# Patient Record
Sex: Male | Born: 1935
Health system: Southern US, Community
[De-identification: ages and names within clinical notes are randomized; demographics above are authoritative.]

## PROBLEM LIST (undated history)

## (undated) ENCOUNTER — Emergency Department (HOSPITAL_COMMUNITY): Discharge status: Against Medical Advice | Payer: Medicare Other

## (undated) DIAGNOSIS — G839 Paralytic syndrome, unspecified: Secondary | ICD-10-CM

## (undated) DIAGNOSIS — K209 Esophagitis, unspecified: Secondary | ICD-10-CM

## (undated) DIAGNOSIS — C61 Malignant neoplasm of prostate: Secondary | ICD-10-CM

## (undated) DIAGNOSIS — Z86010 Personal history of colonic polyps: Secondary | ICD-10-CM

## (undated) DIAGNOSIS — J309 Allergic rhinitis, unspecified: Secondary | ICD-10-CM

## (undated) DIAGNOSIS — N183 Chronic kidney disease, stage 3 (moderate): Principal | ICD-10-CM

## (undated) DIAGNOSIS — K4020 Bilateral inguinal hernia, without obstruction or gangrene, not specified as recurrent: Secondary | ICD-10-CM

## (undated) DIAGNOSIS — R6882 Decreased libido: Secondary | ICD-10-CM

## (undated) DIAGNOSIS — K219 Gastro-esophageal reflux disease without esophagitis: Secondary | ICD-10-CM

## (undated) DIAGNOSIS — K8020 Calculus of gallbladder without cholecystitis without obstruction: Secondary | ICD-10-CM

## (undated) DIAGNOSIS — R972 Elevated prostate specific antigen [PSA]: Secondary | ICD-10-CM

## (undated) DIAGNOSIS — I480 Paroxysmal atrial fibrillation: Secondary | ICD-10-CM

## (undated) DIAGNOSIS — K819 Cholecystitis, unspecified: Secondary | ICD-10-CM

## (undated) DIAGNOSIS — E785 Hyperlipidemia, unspecified: Secondary | ICD-10-CM

## (undated) DIAGNOSIS — I10 Essential (primary) hypertension: Secondary | ICD-10-CM

## (undated) HISTORY — DX: Elevated prostate specific antigen (PSA): R97.20

## (undated) HISTORY — DX: Decreased libido: R68.82

## (undated) HISTORY — PX: HIP SURGERY: SHX245

## (undated) HISTORY — DX: Essential (primary) hypertension: I10

## (undated) HISTORY — PX: HERNIA REPAIR: SHX51

## (undated) HISTORY — DX: Esophagitis, unspecified: K20.9

## (undated) HISTORY — DX: Personal history of colonic polyps: Z86.010

## (undated) HISTORY — PX: JOINT REPLACEMENT: SHX530

## (undated) HISTORY — DX: Allergic rhinitis, unspecified: J30.9

## (undated) HISTORY — DX: Hyperlipidemia, unspecified: E78.5

## (undated) HISTORY — PX: PROSTATE CRYOABLATION: SHX1032

## (undated) HISTORY — DX: Malignant neoplasm of prostate: C61

## (undated) HISTORY — DX: Chronic kidney disease, stage 3 (moderate): N18.3

---

## 1997-07-29 ENCOUNTER — Other Ambulatory Visit: Admission: RE | Admit: 1997-07-29 | Discharge: 1997-07-29 | Payer: Self-pay | Admitting: Internal Medicine

## 1998-10-12 ENCOUNTER — Ambulatory Visit (HOSPITAL_COMMUNITY): Admission: RE | Admit: 1998-10-12 | Discharge: 1998-10-12 | Payer: Self-pay | Admitting: *Deleted

## 2001-10-10 ENCOUNTER — Encounter (INDEPENDENT_AMBULATORY_CARE_PROVIDER_SITE_OTHER): Payer: Self-pay

## 2001-10-10 ENCOUNTER — Ambulatory Visit (HOSPITAL_COMMUNITY): Admission: RE | Admit: 2001-10-10 | Discharge: 2001-10-10 | Payer: Self-pay | Admitting: *Deleted

## 2004-01-19 ENCOUNTER — Ambulatory Visit (HOSPITAL_COMMUNITY): Admission: RE | Admit: 2004-01-19 | Discharge: 2004-01-19 | Payer: Self-pay | Admitting: General Surgery

## 2004-07-11 ENCOUNTER — Encounter (INDEPENDENT_AMBULATORY_CARE_PROVIDER_SITE_OTHER): Payer: Self-pay | Admitting: Specialist

## 2004-07-11 ENCOUNTER — Ambulatory Visit (HOSPITAL_COMMUNITY): Admission: RE | Admit: 2004-07-11 | Discharge: 2004-07-11 | Payer: Self-pay | Admitting: General Surgery

## 2004-07-11 ENCOUNTER — Ambulatory Visit (HOSPITAL_BASED_OUTPATIENT_CLINIC_OR_DEPARTMENT_OTHER): Admission: RE | Admit: 2004-07-11 | Discharge: 2004-07-11 | Payer: Self-pay | Admitting: General Surgery

## 2004-09-28 ENCOUNTER — Ambulatory Visit: Payer: Self-pay | Admitting: Internal Medicine

## 2004-10-05 ENCOUNTER — Ambulatory Visit: Payer: Self-pay | Admitting: Internal Medicine

## 2005-03-14 ENCOUNTER — Ambulatory Visit: Payer: Self-pay | Admitting: Internal Medicine

## 2005-07-28 ENCOUNTER — Ambulatory Visit: Payer: Self-pay | Admitting: Internal Medicine

## 2005-10-10 ENCOUNTER — Ambulatory Visit: Payer: Self-pay | Admitting: Internal Medicine

## 2005-10-17 ENCOUNTER — Ambulatory Visit: Payer: Self-pay | Admitting: Internal Medicine

## 2006-05-08 ENCOUNTER — Ambulatory Visit: Payer: Self-pay | Admitting: Internal Medicine

## 2006-05-08 LAB — CONVERTED CEMR LAB: PSA: 4.52 ng/mL — ABNORMAL HIGH (ref 0.10–4.00)

## 2006-11-01 LAB — HM COLONOSCOPY

## 2007-01-14 ENCOUNTER — Encounter: Payer: Self-pay | Admitting: Internal Medicine

## 2007-01-14 DIAGNOSIS — I10 Essential (primary) hypertension: Secondary | ICD-10-CM

## 2007-01-14 DIAGNOSIS — E785 Hyperlipidemia, unspecified: Secondary | ICD-10-CM | POA: Insufficient documentation

## 2007-01-14 DIAGNOSIS — Z860100 Personal history of colon polyps, unspecified: Secondary | ICD-10-CM | POA: Insufficient documentation

## 2007-01-14 DIAGNOSIS — J309 Allergic rhinitis, unspecified: Secondary | ICD-10-CM

## 2007-01-14 DIAGNOSIS — E782 Mixed hyperlipidemia: Secondary | ICD-10-CM | POA: Insufficient documentation

## 2007-01-14 DIAGNOSIS — K209 Esophagitis, unspecified without bleeding: Secondary | ICD-10-CM | POA: Insufficient documentation

## 2007-01-14 DIAGNOSIS — K219 Gastro-esophageal reflux disease without esophagitis: Secondary | ICD-10-CM | POA: Insufficient documentation

## 2007-01-14 HISTORY — DX: Personal history of colon polyps, unspecified: Z86.0100

## 2007-01-14 HISTORY — DX: Allergic rhinitis, unspecified: J30.9

## 2007-01-14 HISTORY — DX: Personal history of colonic polyps: Z86.010

## 2007-01-14 HISTORY — DX: Hyperlipidemia, unspecified: E78.5

## 2007-01-14 HISTORY — DX: Esophagitis, unspecified without bleeding: K20.90

## 2007-01-14 HISTORY — DX: Essential (primary) hypertension: I10

## 2007-01-15 ENCOUNTER — Ambulatory Visit: Payer: Self-pay | Admitting: Internal Medicine

## 2007-01-15 DIAGNOSIS — R6882 Decreased libido: Secondary | ICD-10-CM | POA: Insufficient documentation

## 2007-01-15 HISTORY — DX: Decreased libido: R68.82

## 2007-01-15 LAB — CONVERTED CEMR LAB
ALT: 24 units/L (ref 0–53)
AST: 22 units/L (ref 0–37)
Albumin: 4.1 g/dL (ref 3.5–5.2)
Alkaline Phosphatase: 54 units/L (ref 39–117)
Bilirubin, Direct: 0.2 mg/dL (ref 0.0–0.3)
CO2: 28 meq/L (ref 19–32)
Chloride: 104 meq/L (ref 96–112)
Cholesterol: 186 mg/dL (ref 0–200)
HDL: 39.5 mg/dL (ref >39.0)
LDL Cholesterol: 114 mg/dL — ABNORMAL HIGH (ref 0–99)
PSA: 5.28 ng/mL — ABNORMAL HIGH (ref 0.10–4.00)
Potassium: 4 meq/L (ref 3.5–5.1)
Sodium: 142 meq/L (ref 135–145)
Testosterone: 400.68 ng/dL (ref 350.00–890)
Total Bilirubin: 1.1 mg/dL (ref 0.3–1.2)
Total CHOL/HDL Ratio: 4.7
Total Protein: 7.7 g/dL (ref 6.0–8.3)
Triglycerides: 165 mg/dL — ABNORMAL HIGH (ref 0–149)
VLDL: 33 mg/dL (ref 0–40)

## 2007-01-22 ENCOUNTER — Encounter: Payer: Self-pay | Admitting: Internal Medicine

## 2008-01-21 ENCOUNTER — Ambulatory Visit: Payer: Self-pay | Admitting: Internal Medicine

## 2008-02-20 ENCOUNTER — Ambulatory Visit: Payer: Self-pay | Admitting: Internal Medicine

## 2008-02-20 LAB — CONVERTED CEMR LAB
ALT: 19 units/L (ref 0–53)
AST: 20 units/L (ref 0–37)
Albumin: 3.7 g/dL (ref 3.5–5.2)
Alkaline Phosphatase: 56 units/L (ref 39–117)
BUN: 16 mg/dL (ref 6–23)
Bilirubin, Direct: 0.2 mg/dL (ref 0.0–0.3)
CO2: 32 meq/L (ref 19–32)
Calcium: 9.2 mg/dL (ref 8.4–10.5)
Chloride: 105 meq/L (ref 96–112)
Cholesterol: 155 mg/dL (ref 0–200)
Creatinine, Ser: 1.5 mg/dL (ref 0.4–1.5)
GFR calc Af Amer: 59 mL/min
GFR calc non Af Amer: 49 mL/min
Glucose, Bld: 77 mg/dL (ref 70–99)
HDL: 37.9 mg/dL — ABNORMAL LOW (ref >39.0)
LDL Cholesterol: 96 mg/dL (ref 0–99)
PSA: 7.43 ng/mL — ABNORMAL HIGH (ref 0.10–4.00)
Potassium: 4.3 meq/L (ref 3.5–5.1)
Sodium: 141 meq/L (ref 135–145)
Testosterone: 366.39 ng/dL (ref 350.00–890)
Total Bilirubin: 0.9 mg/dL (ref 0.3–1.2)
Total CHOL/HDL Ratio: 4.1
Total Protein: 7.1 g/dL (ref 6.0–8.3)
Triglycerides: 108 mg/dL (ref 0–149)
VLDL: 22 mg/dL (ref 0–40)

## 2008-02-27 ENCOUNTER — Encounter: Payer: Self-pay | Admitting: Internal Medicine

## 2008-03-27 ENCOUNTER — Telehealth (INDEPENDENT_AMBULATORY_CARE_PROVIDER_SITE_OTHER): Payer: Self-pay | Admitting: *Deleted

## 2008-03-27 DIAGNOSIS — R972 Elevated prostate specific antigen [PSA]: Secondary | ICD-10-CM

## 2008-03-27 HISTORY — DX: Elevated prostate specific antigen (PSA): R97.20

## 2008-04-14 ENCOUNTER — Ambulatory Visit: Payer: Self-pay | Admitting: Internal Medicine

## 2008-04-14 LAB — CONVERTED CEMR LAB: PSA: 7.27 ng/mL — ABNORMAL HIGH (ref 0.10–4.00)

## 2008-04-19 ENCOUNTER — Encounter: Payer: Self-pay | Admitting: Internal Medicine

## 2008-05-19 ENCOUNTER — Telehealth: Payer: Self-pay | Admitting: Internal Medicine

## 2008-05-20 ENCOUNTER — Encounter (INDEPENDENT_AMBULATORY_CARE_PROVIDER_SITE_OTHER): Payer: Self-pay | Admitting: *Deleted

## 2008-06-16 ENCOUNTER — Encounter: Payer: Self-pay | Admitting: Internal Medicine

## 2008-07-29 ENCOUNTER — Encounter: Payer: Self-pay | Admitting: Internal Medicine

## 2008-08-12 ENCOUNTER — Encounter: Payer: Self-pay | Admitting: Internal Medicine

## 2008-08-31 ENCOUNTER — Encounter (HOSPITAL_COMMUNITY): Admission: RE | Admit: 2008-08-31 | Discharge: 2008-10-22 | Payer: Self-pay | Admitting: Urology

## 2008-09-02 ENCOUNTER — Encounter: Payer: Self-pay | Admitting: Internal Medicine

## 2009-01-21 ENCOUNTER — Ambulatory Visit: Payer: Self-pay | Admitting: Internal Medicine

## 2009-01-21 DIAGNOSIS — C61 Malignant neoplasm of prostate: Secondary | ICD-10-CM | POA: Insufficient documentation

## 2009-01-21 HISTORY — DX: Malignant neoplasm of prostate: C61

## 2009-01-21 LAB — CONVERTED CEMR LAB
ALT: 24 units/L (ref 0–53)
AST: 22 units/L (ref 0–37)
Albumin: 3.8 g/dL (ref 3.5–5.2)
Alkaline Phosphatase: 40 units/L (ref 39–117)
BUN: 16 mg/dL (ref 6–23)
Basophils Absolute: 0.1 10*3/uL (ref 0.0–0.1)
Basophils Relative: 0.7 % (ref 0.0–3.0)
Bilirubin, Direct: 0 mg/dL (ref 0.0–0.3)
CO2: 31 meq/L (ref 19–32)
Calcium: 9.4 mg/dL (ref 8.4–10.5)
Chloride: 104 meq/L (ref 96–112)
Cholesterol: 187 mg/dL (ref 0–200)
Creatinine, Ser: 1.1 mg/dL (ref 0.4–1.5)
Direct LDL: 106.1 mg/dL
Eosinophils Absolute: 0.2 10*3/uL (ref 0.0–0.7)
Eosinophils Relative: 2.2 % (ref 0.0–5.0)
GFR calc non Af Amer: 69.55 mL/min (ref >60)
Glucose, Bld: 96 mg/dL (ref 70–99)
HCT: 39.8 % (ref 39.0–52.0)
HDL: 42.2 mg/dL (ref >39.00)
Hemoglobin: 13.8 g/dL (ref 13.0–17.0)
Lymphocytes Relative: 23.4 % (ref 12.0–46.0)
Lymphs Abs: 1.8 10*3/uL (ref 0.7–4.0)
MCHC: 34.6 g/dL (ref 30.0–36.0)
MCV: 95.3 fL (ref 78.0–100.0)
Monocytes Absolute: 0.6 10*3/uL (ref 0.1–1.0)
Monocytes Relative: 8.5 % (ref 3.0–12.0)
Neutro Abs: 4.9 10*3/uL (ref 1.4–7.7)
Neutrophils Relative %: 65.2 % (ref 43.0–77.0)
Platelets: 179 10*3/uL (ref 150.0–400.0)
Potassium: 4.3 meq/L (ref 3.5–5.1)
RBC: 4.18 M/uL — ABNORMAL LOW (ref 4.22–5.81)
RDW: 12.9 % (ref 11.5–14.6)
Sodium: 141 meq/L (ref 135–145)
TSH: 1.28 microintl units/mL (ref 0.35–5.50)
Total Bilirubin: 0.8 mg/dL (ref 0.3–1.2)
Total CHOL/HDL Ratio: 4
Total Protein: 7.7 g/dL (ref 6.0–8.3)
Triglycerides: 233 mg/dL — ABNORMAL HIGH (ref 0.0–149.0)
VLDL: 46.6 mg/dL — ABNORMAL HIGH (ref 0.0–40.0)
WBC: 7.6 10*3/uL (ref 4.5–10.5)

## 2009-02-22 ENCOUNTER — Encounter: Payer: Self-pay | Admitting: Internal Medicine

## 2009-03-03 ENCOUNTER — Encounter: Payer: Self-pay | Admitting: Internal Medicine

## 2009-03-05 ENCOUNTER — Telehealth (INDEPENDENT_AMBULATORY_CARE_PROVIDER_SITE_OTHER): Payer: Self-pay | Admitting: *Deleted

## 2009-04-13 ENCOUNTER — Encounter: Payer: Self-pay | Admitting: Internal Medicine

## 2009-04-16 ENCOUNTER — Ambulatory Visit (HOSPITAL_BASED_OUTPATIENT_CLINIC_OR_DEPARTMENT_OTHER): Admission: RE | Admit: 2009-04-16 | Discharge: 2009-04-16 | Payer: Self-pay | Admitting: Urology

## 2009-04-23 ENCOUNTER — Encounter: Payer: Self-pay | Admitting: Internal Medicine

## 2009-08-18 ENCOUNTER — Encounter: Payer: Self-pay | Admitting: Internal Medicine

## 2009-10-26 ENCOUNTER — Encounter: Payer: Self-pay | Admitting: Internal Medicine

## 2009-11-03 ENCOUNTER — Encounter: Payer: Self-pay | Admitting: Internal Medicine

## 2009-11-23 ENCOUNTER — Encounter: Payer: Self-pay | Admitting: Internal Medicine

## 2010-01-12 ENCOUNTER — Encounter: Payer: Self-pay | Admitting: Internal Medicine

## 2010-01-25 ENCOUNTER — Encounter: Payer: Self-pay | Admitting: Internal Medicine

## 2010-01-25 ENCOUNTER — Ambulatory Visit
Admission: RE | Admit: 2010-01-25 | Discharge: 2010-01-25 | Payer: Self-pay | Source: Home / Self Care | Attending: Internal Medicine | Admitting: Internal Medicine

## 2010-01-25 LAB — CONVERTED CEMR LAB
ALT: 21 units/L (ref 0–53)
AST: 21 units/L (ref 0–37)
Albumin: 3.7 g/dL (ref 3.5–5.2)
Alkaline Phosphatase: 38 units/L — ABNORMAL LOW (ref 39–117)
BUN: 19 mg/dL (ref 6–23)
Basophils Absolute: 0 10*3/uL (ref 0.0–0.1)
Basophils Relative: 0.2 % (ref 0.0–3.0)
Bilirubin, Direct: 0.1 mg/dL (ref 0.0–0.3)
CO2: 30 meq/L (ref 19–32)
Calcium: 9.4 mg/dL (ref 8.4–10.5)
Chloride: 103 meq/L (ref 96–112)
Cholesterol: 205 mg/dL — ABNORMAL HIGH (ref 0–200)
Creatinine, Ser: 1.4 mg/dL (ref 0.4–1.5)
Direct LDL: 131 mg/dL
Eosinophils Absolute: 0.2 10*3/uL (ref 0.0–0.7)
Eosinophils Relative: 1.9 % (ref 0.0–5.0)
GFR calc non Af Amer: 51.66 mL/min — ABNORMAL LOW (ref >60.00)
Glucose, Bld: 95 mg/dL (ref 70–99)
HCT: 42.4 % (ref 39.0–52.0)
HDL: 37 mg/dL — ABNORMAL LOW (ref >39.00)
Hemoglobin: 14.8 g/dL (ref 13.0–17.0)
Lymphocytes Relative: 13.3 % (ref 12.0–46.0)
Lymphs Abs: 1.4 10*3/uL (ref 0.7–4.0)
MCHC: 35 g/dL (ref 30.0–36.0)
MCV: 92.9 fL (ref 78.0–100.0)
Monocytes Absolute: 1.2 10*3/uL — ABNORMAL HIGH (ref 0.1–1.0)
Monocytes Relative: 11.1 % (ref 3.0–12.0)
Neutro Abs: 7.9 10*3/uL — ABNORMAL HIGH (ref 1.4–7.7)
Neutrophils Relative %: 73.5 % (ref 43.0–77.0)
Platelets: 188 10*3/uL (ref 150.0–400.0)
Potassium: 4.2 meq/L (ref 3.5–5.1)
RBC: 4.56 M/uL (ref 4.22–5.81)
RDW: 13.6 % (ref 11.5–14.6)
Sodium: 140 meq/L (ref 135–145)
Total Bilirubin: 0.8 mg/dL (ref 0.3–1.2)
Total CHOL/HDL Ratio: 6
Total Protein: 7.2 g/dL (ref 6.0–8.3)
Triglycerides: 362 mg/dL — ABNORMAL HIGH (ref 0.0–149.0)
VLDL: 72.4 mg/dL — ABNORMAL HIGH (ref 0.0–40.0)
WBC: 10.8 10*3/uL — ABNORMAL HIGH (ref 4.5–10.5)

## 2010-02-24 NOTE — Consult Note (Signed)
Summary: [facility]  [facility]   Imported By: Rise Patience 01/25/2010 14:21:07  _____________________________________________________________________  External Attachment:    Type:   Image     Comment:   External Document

## 2010-02-24 NOTE — Medication Information (Signed)
Summary: [facility]  [facility]   Imported By: Phillis Knack 03/09/2009 07:42:08  _____________________________________________________________________  External Attachment:    Type:   Image     Comment:   External Document

## 2010-02-24 NOTE — Assessment & Plan Note (Signed)
Summary: yearly cpx/cd   Vital Signs:  Patient Profile:   77 Years Old Male Height:     68 inches Weight:      190 pounds Temp:     97.9 degrees F oral Pulse rate:   62 / minute BP sitting:   142 / 82  (right arm) Cuff size:   large  Vitals Entered By: Iran Planas CMA (January 21, 2008 10:03 AM)                 PCP:  [facility]  Chief Complaint:  Annual visit for disease management.  History of Present Illness: Patient presents for annual exam. IN the interval since Dec '08, no medical problems, no surgeries or injuries. He reports that he has soreness in the right neck which moves to the chest and back in the evening which resolves over-night. He can push on the area of soreness in the right neck and it will go away. This happens several times a week. No chest heaviness or pressure, no SOB, no associated symptoms. Unrelated to meals.   Has morning congestion which is minor. No drainage or fever or other signs of infection.    Prior Medications Reviewed Using: Patient Recall  Updated Prior Medication List: NEXIUM 40 MG  CPDR (ESOMEPRAZOLE MAGNESIUM) Take 1 tablet by mouth once a day VYTORIN 10-20 MG  TABS (EZETIMIBE-SIMVASTATIN) Take 1 tablet by mouth once a day FUROSEMIDE 40 MG  TABS (FUROSEMIDE) 1 once daily OMEGA-3 1000 MG CAPS (OMEGA-3 FATTY ACIDS) Take 1 capsule by mouth two times a day  Current Allergies (reviewed today): No known allergies   Past Medical History:    Reviewed history from 01/15/2007 and no changes required:       ESOPHAGITIS (ICD-530.10)       HYPERTENSION (ICD-401.9)       HYPERLIPIDEMIA (ICD-272.4)       COLONIC POLYPS, HX OF (ICD-V12.72)       ALLERGIC RHINITIS (ICD-477.9)       decreased libido          Past Surgical History:    Reviewed history from 01/14/2007 and no changes required:       Inguinal herniorrhaphy x 3   Family History:    Reviewed history from 01/15/2007 and no changes required:       father-died 04-02-61; lung  cancer       mother- deceased @89        positive HTN       Brother -died cerebral aneurysm       Neg- colon, prostate cancer: DM, CAD  Social History:    Reviewed history from 01/15/2007 and no changes required:       Trained mortician       Married 1959/04/03       2 son- '63, '69  1 daughter 04/02/53       grandchildren 5       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  The patient denies anorexia, fever, weight loss, weight gain, decreased hearing, hoarseness, chest pain,  dyspnea on exertion, prolonged cough, headaches, abdominal pain, hematochezia, severe indigestion/heartburn, incontinence, suspicious skin lesions, difficulty walking, and enlarged lymph nodes.         blurred vision.   Physical Exam  General:     Well-developed,well-nourished,in no acute distress; alert,appropriate and cooperative throughout examination Head:     Normocephalic and atraumatic without obvious abnormalities. No apparent alopecia or balding. Eyes:     Fundiscopic exam deferred to opthal.vision grossly intact, pupils equal, pupils round, corneas and lenses clear, and no injection.   Ears:     Right EAC with cereumen impaction. Let EAC with cereumen, TM normal. Hearing intact. Nose:     no external deformity, no external erythema, and no nasal discharge.   Mouth:     Oral mucosa and oropharynx without lesions or exudates.  Teeth in good repair. Neck:     No deformities, masses, or tenderness noted.no thyromegaly and no carotid bruits.   Chest Wall:     No deformities, masses, tenderness or gynecomastia noted. Lungs:     Normal respiratory effort, chest expands symmetrically.  Lungs are clear to auscultation, no crackles or wheezes. Heart:     Normal rate and regular rhythm. S1 and S2 normal without gallop, murmur, click, rub or other extra sounds. Abdomen:     Bowel sounds positive,abdomen soft and non-tender without masses, organomegaly or hernias noted. Rectal:     no external abnormalities, no hemorrhoids, normal sphincter tone, and no masses.   Prostate:     Prostate gland firm and smooth, no enlargement, nodularity, tenderness, mass, asymmetry or induration. Msk:     No deformity or scoliosis noted of thoracic or lumbar spine.  no joint tenderness, no joint swelling, no joint warmth, no joint deformities, and no joint instability.   Pulses:     R and L carotid,radial,femoral,dorsalis pedis and posterior tibial pulses are full and equal bilaterally Extremities:     No clubbing, cyanosis, edema, or deformity noted with normal full range of motion of all joints.   Neurologic:     alert & oriented X3, cranial nerves II-XII intact, strength normal in all extremities, gait normal, and DTRs symmetrical and normal.   Skin:     turgor normal, color normal, no suspicious lesions, no ecchymoses, no ulcerations, and no edema.   Cervical Nodes:     No lymphadenopathy noted Inguinal Nodes:     No significant adenopathy Psych:     Oriented X3, memory intact for recent and remote, normally interactive, good eye contact, and not anxious appearing.      Impression & Recommendations:  Problem # 1:  LIBIDO, DECREASED (ICD-799.81) No real change...still with mildly decreased libido  Plan: testosterone level on return for lab.         recommendations to be based on results.  Orders: TLB-Testosterone, Total (84403-TESTO)   Problem # 2:  HYPERTENSION (ICD-401.9) BP is mildly elevated and has remained elevated since last visit. Discussed goal of SBP less than 140 routinely to maximize risk reduction.  Plan: change to lisinopril/hct 10/12.5          f/u lab in 3-4  weeks to rule out renal effects  His updated medication list for this problem includes:    Lisinopril-hydrochlorothiazide 10-12.5 Mg Tabs (Lisinopril-hydrochlorothiazide) .[name]... 1 by mouth once daily  Orders: TLB-BMP (Basic Metabolic Panel-BMET) ([id number])  BP today: 142/82 Prior BP: 142/83 (01/15/2007)  Labs Reviewed: Chol: 186 (01/15/2007)   HDL: 39.5 (01/15/2007)   LDL: 114 (01/15/2007)   TG:  165 (01/15/2007)   Problem # 3:  HYPERLIPIDEMIA (ICD-272.4) Patient has had good control on present meds. He tolerates vytorin well.  Plan: f/u lab on return with adjustments as indicated.  His updated medication list for this problem includes:    Vytorin 10-20 Mg Tabs (Ezetimibe-simvastatin) .[name]... Take 1 tablet by mouth once a day  Orders: TLB-Lipid Panel (80061-LIPID) TLB-Hepatic/Liver Function Pnl (80076-HEPATIC)   Problem # 4:  Preventive Health Care (ICD-V70.0) Current with colorectal cancer screening. Normal prostate exam and labs will follow. He is encouraged to resume regular exercise which will be a benefit for BP and cholesterol control as well as weight loss.  In summary: a very pleasant gentleman who appears to be medically stable. He will return for labs in 3-4 weeks with results to be forwarded to him. He will return for routine exam as needed or in 1 year.  Complete Medication List: 1)  Nexium 40 Mg Cpdr (Esomeprazole magnesium) .... Take 1 tablet by mouth once a day 2)  Vytorin 10-20 Mg Tabs (Ezetimibe-simvastatin) .... Take 1 tablet by mouth once a day 3)  Lisinopril-hydrochlorothiazide 10-12.5 Mg Tabs (Lisinopril-hydrochlorothiazide) .[name].. 1 by mouth once daily 4)  Omega-3 1000 Mg Caps (Omega-3 fatty acids) .... Take 1 capsule by mouth two times a day  Other Orders: TLB-CBC Platelet - w/Differential (85025-CBCD)    Prescriptions: VYTORIN 10-20 MG  TABS (EZETIMIBE-SIMVASTATIN) Take 1 tablet by mouth once a day  #30 x 12   Entered and Authorized by:   Neena Rhymes MD   Signed by:   Neena Rhymes MD on 01/21/2008   Method used:   Electronically to        [facility] (retail)       [place] [address] [place], [place]  [address]       Ph: [phone]       Fax: [phone]   RxID:   [id number] NEXIUM 40 MG  CPDR (ESOMEPRAZOLE MAGNESIUM) Take 1 tablet by mouth once a day  #30 x 12   Entered and Authorized by:   Neena Rhymes MD   Signed by:   Neena Rhymes MD on 01/21/2008   Method used:   Electronically to        [facility] (retail)       [place] [address] [place], [place]  [address]       Ph: [phone]       Fax: [phone]   RxID:   [id number] LISINOPRIL-HYDROCHLOROTHIAZIDE 10-12.5 MG TABS (LISINOPRIL-HYDROCHLOROTHIAZIDE) 1 by mouth once daily  #30 x 12   Entered and Authorized by:   Neena Rhymes MD   Signed by:   Neena Rhymes MD on 01/21/2008   Method used:   Electronically to        [facility] (retail)       [facility] [address]       [place], [place]  [address]       Ph: [phone]       Fax: [phone]   RxID:   [id number]  ]

## 2010-02-24 NOTE — Letter (Signed)
[facility]   Imported By: Phillis Knack 11/02/2009 15:02:16  _____________________________________________________________________  External Attachment:    Type:   Image     Comment:   External Document

## 2010-02-24 NOTE — Letter (Signed)
Summary: [facility]  [facility]   Imported By: Bubba Hales 05/03/2009 09:34:19  _____________________________________________________________________  External Attachment:    Type:   Image     Comment:   External Document

## 2010-02-24 NOTE — Assessment & Plan Note (Signed)
Summary: CPX/MCR/$50/CD   Vital Signs:  Patient profile:   77 year old male Height:      68 inches Weight:      188 pounds BMI:     28.69 O2 Sat:      97 % on Room air Temp:     97.9 degrees F oral Pulse rate:   64 / minute BP sitting:   126 / 62  (left arm) Cuff size:   regular  Vitals Entered By: Charlynne Cousins CMA (January 21, 2009 10:02 AM)  O2 Flow:  Room air CC: pt here for yearly physical.  PT has never had pneumonia, tetanus or zostavax injections/ ab   Primary Care Provider:  Norins  CC:  pt here for yearly physical.  PT has never had pneumonia and tetanus or zostavax injections/ ab.  History of Present Illness: Patient presents for a routine exam He is feeling fine.   For his prostate cancer: he is taking Trelstar every three months. Reviewed Dr. Arlyn Leak August note: the plan is to reduce prostate mass and then consider Seed implant vs XRT vs Cryotherapy. He also had a baseline DXA scan and is taking Actonel monthly. With this treatment he is doing well. Last PSA 0.07.    Current Medications (verified): 1)  Nexium 40 Mg  Cpdr (Esomeprazole Magnesium) .... Take 1 Tablet By Mouth Once A Day 2)  Vytorin 10-20 Mg  Tabs (Ezetimibe-Simvastatin) .... Take 1 Tablet By Mouth Once A Day 3)  Lisinopril-Hydrochlorothiazide 10-12.5 Mg Tabs (Lisinopril-Hydrochlorothiazide) .[name].. 1 By Mouth Once Daily 4)  Omega-3 1000 Mg Caps (Omega-3 Fatty Acids) .... 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 Inguinal herniorrhaphy x 3  Family History: Reviewed history from 2008/01/27 and no changes required. father-died 85; lung cancer mother- deceased @89  positive HTN Brother -died cerebral aneurysm Neg- colon, prostate cancer: DM, CAD  Social History: Reviewed history from 01-27-2008 and no changes required. 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    Physical Exam  General:  WNWD AA male who looks younger than his stated age. Head:  Normocephalic and atraumatic without obvious abnormalities. No apparent alopecia or balding. Eyes:  No corneal or conjunctival inflammation noted. EOMI. Perrla. Funduscopic exam benign, without hemorrhages, exudates or papilledema. Vision grossly normal. Ears:  External ear exam shows no significant lesions or deformities.  Otoscopic examination reveals clear canals, tympanic membranes are intact bilaterally  without bulging, retraction, inflammation or discharge. Hearing is grossly normal bilaterally. Nose:  External nasal examination shows no  deformity or inflammation. Nasal mucosa are pink and moist without lesions or exudates. Mouth:  Oral mucosa and oropharynx without lesions or exudates.  Teeth in good repair. Neck:  supple, no thyromegaly, and no carotid bruits.   Chest Wall:  No deformities, masses, tenderness or gynecomastia noted. Lungs:  Normal respiratory effort, chest expands symmetrically. Lungs are clear to auscultation, no crackles or wheezes. Heart:  Normal rate and regular rhythm. S1 and S2 normal without gallop, murmur, click, rub or other extra sounds. Abdomen:  soft, non-tender, normal bowel sounds, no masses, and no hepatomegaly.   Prostate:  deferred to GU Msk:  normal ROM, no joint tenderness, no joint swelling, no joint warmth, and no joint deformities.   Pulses:  2+ radial and DP pulses Extremities:  No clubbing, cyanosis, edema, or deformity noted with normal full range of motion of all joints.   Neurologic:  No cranial nerve deficits noted. Station and gait are normal. Plantar reflexes are down-going bilaterally. DTRs are symmetrical throughout. Sensory, motor and coordinative functions appear intact. Skin:  turgor normal, color normal, no rashes, no suspicious lesions, and no ulcerations.   Cervical Nodes:  no anterior cervical adenopathy and no posterior cervical adenopathy.   Psych:  Oriented X3, memory intact for recent and remote, normally interactive, good eye contact, and not anxious appearing.     Impression & Recommendations:  Problem # 1:  ADENOCARCINOMA, PROSTATE, GLEASON GRADE 5 (ICD-185) Patient is doing well by his report. He is follow on a regular basis by Dr. Gaynelle Arabian.  Problem # 2:  HYPERTENSION (ICD-401.9)  His updated medication list for this problem includes:    Lisinopril-hydrochlorothiazide 10-12.5 Mg Tabs (Lisinopril-hydrochlorothiazide) .[name]... 1 by mouth once daily  Orders: TLB-BMP (Basic Metabolic Panel-BMET) ([id number])  BP today: 126/62 Prior BP: 142/82  (01/21/2008)  Good control on present medication.  Addendum - metabolic panel is normal.  Plan - continue puresent medications.   Problem # 3:  HYPERLIPIDEMIA (JIR-678.4) Due for lab with recommendations to follow. His updated medication list for this problem includes:    Vytorin 10-20 Mg Tabs (Ezetimibe-simvastatin) .[name]... Take 1 tablet by mouth once a day  Orders: TLB-Lipid Panel (80061-LIPID) TLB-Hepatic/Liver Function Pnl (80076-HEPATIC) TLB-TSH (Thyroid Stimulating Hormone) (84443-TSH)  Addednum - HDL is 42.2 and LDL is 106 - basically at goal.  Plan - continue present medications.  Problem # 4:  Preventive Health Care (ICD-V70.0) No complaints at this visit. Physical exam is normal. Lab results are fine with normal blood sugar, chemistries and liver functions. He is current with colorectal cancer screening. Flu vaccine and immunizations declined.  In summary - a very fine gentleman who is medically stable and doing well. He will return as needed or in 1 year.   Complete Medication List: 1)  Nexium 40 Mg Cpdr (Esomeprazole magnesium) .... Take 1 tablet by mouth once a day 2)  Vytorin 10-20 Mg Tabs (Ezetimibe-simvastatin) .... Take 1 tablet by mouth once a day 3)  Lisinopril-hydrochlorothiazide 10-12.5 Mg Tabs (Lisinopril-hydrochlorothiazide) .[name].. 1 by mouth once daily 4)  Omega-3 1000 Mg Caps (Omega-3 fatty acids) .... Take 1 capsule by mouth two times a day  Other Orders: TLB-CBC Platelet - w/Differential (85025-CBCD)   Patient: Austin [name] Note: All result statuses are Final unless otherwise noted.  Tests: (1) Lipid Panel (LIPID)   Cholesterol  187 mg/dL                   0-200     ATP III Classification            Desirable:  < 200 mg/dL                    Borderline High:  200 - 239 mg/dL               High:  > = 240 mg/dL   Triglycerides        [H]  233.0 mg/dL                 0.0-149.0     Normal:  <150 mg/dL     Borderline High:  150 - 199  mg/dL   HDL                       42.20 mg/dL                 >39.00   VLDL Cholesterol     [H]  46.6 mg/dL                  0.0-40.0  CHO/HDL Ratio:  CHD Risk                             4                    Men          Women     1/2 Average Risk     3.4          3.3     Average Risk          5.0          4.4     2X Average Risk          9.6          7.1     3X Average Risk          15.0          11.0                           Tests: (2) Hepatic/Liver Function Panel (HEPATIC)   Total Bilirubin           0.8 mg/dL                   0.3-1.2   Direct Bilirubin          0.0 mg/dL                   0.0-0.3   Alkaline Phosphatase      40 U/L                      39-117   AST                       22 U/L                      0-37   ALT                       24 U/L  0-53   Total Protein             7.7 g/dL                    6.0-8.3   Albumin                   3.8 g/dL                    3.5-5.2  Tests: (3) BMP (METABOL)   Sodium                    141 mEq/L                   135-145   Potassium                 4.3 mEq/L                   3.5-5.1   Chloride                  104 mEq/L                   96-112   Carbon Dioxide            31 mEq/L                    19-32   Glucose                   96 mg/dL                    70-99   BUN                       16 mg/dL                    6-23   Creatinine                1.1 mg/dL                   0.4-1.5   Calcium                   9.4 mg/dL                   8.4-10.5   GFR                       69.55 mL/min                >60  Tests: (4) TSH (TSH)   FastTSH                   1.28 uIU/mL                 0.35-5.50  Tests: (5) CBC Platelet w/Diff (CBCD)   White Cell Count          7.6 K/uL                    4.5-10.5   Red Cell Count       [L]  4.18 Mil/uL                 4.22-5.81   Hemoglobin  13.8 g/dL                   13.0-17.0   Hematocrit                39.8 %                      39.0-52.0    MCV                       95.3 fl                     78.0-100.0   MCHC                      34.6 g/dL                   30.0-36.0   RDW                       12.9 %                      11.5-14.6   Platelet Count            179.0 K/uL                  150.0-400.0   Neutrophil %              65.2 %                      43.0-77.0   Lymphocyte %              23.4 %                      12.0-46.0   Monocyte %                8.5 %                       3.0-12.0   Eosinophils%              2.2 %                       0.0-5.0   Basophils %               0.7 %                       0.0-3.0   Neutrophill Absolute      4.9 K/uL                    1.4-7.7   Lymphocyte Absolute       1.8 K/uL                    0.7-4.0   Monocyte Absolute         0.6 K/uL                    0.1-1.0  Eosinophils, Absolute                             0.2 K/uL  0.0-0.7   Basophils Absolute        0.1 K/uL                    0.0-0.1  Tests: (6) Cholesterol LDL - Direct (DIRLDL)  Cholesterol LDL - Direct                             106.1 mg/dLPrescriptions: LISINOPRIL-HYDROCHLOROTHIAZIDE 10-12.5 MG TABS (LISINOPRIL-HYDROCHLOROTHIAZIDE) 1 by mouth once daily  #30 x 12   Entered and Authorized by:   Neena Rhymes MD   Signed by:   Neena Rhymes MD on 01/21/2009   Method used:   Electronically to        [facility] (retail)       [place] [address]       [place], [place]  [address]       Ph: [phone]       Fax: [phone]   RxID:   [id number] VYTORIN 10-20 MG  TABS (EZETIMIBE-SIMVASTATIN) Take 1 tablet by mouth once a day  #30 x 12   Entered and Authorized by:   Neena Rhymes MD   Signed by:   Neena Rhymes MD on 01/21/2009   Method used:   Electronically to        [facility] (retail)       [facility] [address]       [place], [place]  [address]       Ph: [phone]       Fax: [phone]   RxID:    [id number] NEXIUM 40 MG  CPDR (ESOMEPRAZOLE MAGNESIUM) Take 1 tablet by mouth once a day  #30 x 12   Entered and Authorized by:   Neena Rhymes MD   Signed by:   Neena Rhymes MD on 01/21/2009   Method used:   Electronically to        [facility] (retail)       [facility] [address]       [place], [place]  [address]       Ph: [phone]       Fax: [phone]   RxID:   [id number]    Preventive Care Screening  Colonoscopy:    Date:  11/01/2006    Results:  Diverticulosis     Not Administered:    Influenza Vaccine not given due to: declined

## 2010-02-24 NOTE — Letter (Signed)
   [facility] [place], [place]  [address] Phone: [phone]      January 22, 2007   Austin [name] [address] [place], [place] [address]  RE:  LAB RESULTS  Dear  [name],  The following is an interpretation of your most recent lab tests.  Please take note of any instructions provided or changes to medications that have resulted from your lab work.  PSA:  high - further testing needed PSA: 5.28  ELECTROLYTES:  Good - no changes needed  KIDNEY FUNCTION TESTS:  Good - no changes needed  LIPID PANEL:  Good - no changes needed Triglyceride: 165   Cholesterol: 186   LDL: 114   HDL: 39.5   Chol/HDL%:  4.7 CALC   We will need to discuss your PSA at your next office visit. Please come see me if you have any questions about these lab results    Sincerely Yours,    Neena Rhymes MD

## 2010-02-24 NOTE — Progress Notes (Signed)
Summary: Callback  Phone Note Call from Patient Call back at Work Phone [phone]   Caller: Patient Call For: Neena Rhymes MD Summary of Call: 11:48am-Pt left message stating he was told to call and make an appointment, requesting a callback. Initial call taken by: Iran Planas CMA,  May 19, 2008 12:12 PM  Follow-up for Phone Call        Spoke with pt and he advised he received the letter from Dr. Linda Hedges in the mail and wishes to follow up with Urology. "Im ready when he is ready." Follow-up by: Iran Planas CMA,  May 19, 2008 2:36 PM  Additional Follow-up for Phone Call Additional follow up Details #1::        will notify PCCs to make an appointment [facility] Additional Follow-up by: Neena Rhymes MD,  May 19, 2008 3:28 PM

## 2010-02-24 NOTE — Consult Note (Signed)
Summary: [facility]  [facility]   Imported By: Bubba Hales 08/23/2009 09:17:48  _____________________________________________________________________  External Attachment:    Type:   Image     Comment:   External Document

## 2010-02-24 NOTE — Assessment & Plan Note (Signed)
Summary: CPX-STC  Medications Added FUROSEMIDE 40 MG  TABS (FUROSEMIDE) 1 once daily      Allergies Added: NKDA  Vital Signs:  Patient Profile:   77 Years Old Male Height:     68 inches Weight:      188 pounds Temp:     97.5 degrees F oral Pulse rate:   67 / minute BP sitting:   142 / 83  (left arm) Cuff size:   large  Vitals Entered By: Iran Planas CMA (January 15, 2007 9:51 AM)                 PCP:  Norins  Chief Complaint:  CPX.  History of Present Illness: Interval since last Sept. '07  cc: stopped up ears. Pressure  behind the eyes.       Had colonoscopy - 2 hyperplastic polyps.  Current Allergies: No known allergies   Past Medical History:    Reviewed history from 01/14/2007 and no changes required:       ESOPHAGITIS (ICD-530.10)       HYPERTENSION (ICD-401.9)       HYPERLIPIDEMIA (ICD-272.4)       COLONIC POLYPS, HX OF (ICD-V12.72)       ALLERGIC RHINITIS (ICD-477.9)       decreased libido          Past Surgical History:    Reviewed history from 01/14/2007 and no changes required:       Inguinal herniorrhaphy x 3   Family History:    father-died 42; lung cancer    positive H Tn    Brother -died cerebral aneurysm    Neg- colon, prostate cancer: DM, CAD  Social History:    Trained mortician    Married 1961    2 son- '63, '69  1 daughter '43    grandchildren 5    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   Risk Factors:  Caffeine use:  2 drinks per day Alcohol use:  yes    Comments:  socially    Counseled to quit/cut down alcohol use:  no Exercise:  yes    Times per week:  3    Type:  treadmill Seatbelt use:  100 %   Review of Systems  The patient denies anorexia, fever, weight loss, vision loss, decreased hearing, hoarseness, chest pain, syncope, dyspnea on exhertion, peripheral edema, prolonged cough, hemoptysis, abdominal pain, melena, hematochezia, severe indigestion/heartburn, hematuria, incontinence, genital sores, muscle weakness,  suspicious skin lesions, transient blindness, difficulty walking, depression, unusual weight change, abnormal bleeding, enlarged lymph nodes, angioedema, and testicular masses.     Physical Exam  General:     Well-developed,well-nourished,in no acute distress; alert,appropriate and cooperative throughout examination Head:     Normocephalic and atraumatic without obvious abnormalities. No apparent alopecia or balding. Eyes:     No corneal or conjunctival inflammation noted. EOMI. Perrla. Funduscopic exam benign, without hemorrhages, exudates or papilledema. Vision grossly normal. Arcus senilis. Ears:     cereumen impaction Bilateral Mouth:     Oral mucosa and oropharynx without lesions or exudates.  Teeth in good repair. Neck:     No deformities, masses, or tenderness noted. Chest Wall:     No deformities, masses, tenderness or gynecomastia noted. Lungs:     Normal respiratory effort, chest expands symmetrically. Lungs are clear to auscultation, no crackles or wheezes. Heart:     Normal rate and regular rhythm. S1 and S2 normal without gallop, murmur, click, rub or other extra sounds. Abdomen:  soft, non-tender, normal bowel sounds, no distention, no masses, no guarding, and no inguinal hernia.   Rectal:     No external abnormalities noted. Normal sphincter tone. No rectal masses or tenderness. Genitalia:     Testes bilaterally descended without nodularity, tenderness or masses. No scrotal masses or lesions. No penis lesions or urethral discharge. Prostate:     Prostate gland firm and smooth, no enlargement, nodularity, tenderness, mass, asymmetry or induration. Msk:     normal ROM, no joint tenderness, no joint swelling, no joint warmth, no redness over joints, and no joint deformities.   Pulses:     R and L carotid,radial,femoral,dorsalis pedis and posterior tibial pulses are full and equal bilaterally Extremities:     1+ left pedal edema and 1+ right pedal edema.     Neurologic:     alert & oriented X3, cranial nerves II-XII intact, strength normal in all extremities, sensation intact to light touch, and DTRs symmetrical and normal.   Skin:     turgor normal, color normal, no rashes, and no suspicious lesions.   Cervical Nodes:     no anterior cervical adenopathy and no posterior cervical adenopathy.   Axillary Nodes:     No palpable lymphadenopathy Inguinal Nodes:     No significant adenopathy Psych:     Cognition and judgment appear intact. Alert and cooperative with normal attention span and concentration. No apparent delusions, illusions, hallucinations    Impression & Recommendations:  Problem # 1:  LIBIDO, DECREASED (ICD-799.81)  Orders: TLB-Testosterone, Total (84403-TESTO)  Plan: if testosterone is low will offer replacement. If testosterone is normal will consider Viagra, etc.  Problem # 2:  HYPERTENSION (ICD-401.9)  The following medications were removed from the medication list:    Hydrochlorothiazide 12.5 Mg Tabs (Hydrochlorothiazide) .[name]... Take 1 tablet by mouth once a day  His updated medication list for this problem includes:    Furosemide 40 Mg Tabs (Furosemide) .[name]... 1 once daily  Orders: TLB-Electrolyte Panel (NA/K/CL/CO2) (80051-LYTES)  BP today: 142/83 Prior BP: 140/78 (10/17/2005)  Plan: borderline control. Will d/c hctz and start a loop diuretic, e.g. furosemide. Patient should have BP check.   Problem # 3:  HYPERLIPIDEMIA (TKW-409.4)  His updated medication list for this problem includes:    Vytorin 10-20 Mg Tabs (Ezetimibe-simvastatin) .[name]... Take 1 tablet by mouth once a day  Orders: TLB-Lipid Panel (80061-LIPID) TLB-Hepatic/Liver Function Pnl (80076-HEPATIC)  Plan: recommendations will be based on lab report. Goal is LDL 100 or less.   Problem # 4:  COLONIC POLYPS, HX OF (ICD-V12.72) Had recent colonoscopy with two hyperplastic polyps. Would need repeat study in 10 years.  Problem # 5:  SPECIAL  SCREENING MALIGNANT NEOPLASM OF PROSTATE (ICD-V76.44)  Orders: TLB-PSA (Prostate Specific Antigen) (84153-PSA)  Routine screening.  In general doing well. Follow-up in 1 year or as needed.      Complete Medication List: 1)  Nexium 40 Mg Cpdr (Esomeprazole magnesium) .... Take 1 tablet by mouth once a day 2)  Vytorin 10-20 Mg Tabs (Ezetimibe-simvastatin) .... Take 1 tablet by mouth once a day 3)  Furosemide 40 Mg Tabs (Furosemide) .[name].. 1 once daily  Other Orders: Cerumen Impaction Removal (73532)     Prescriptions: NEXIUM 40 MG  CPDR (ESOMEPRAZOLE MAGNESIUM) Take 1 tablet by mouth once a day  #30 x prn   Entered and Authorized by:   Neena Rhymes MD   Signed by:   Neena Rhymes MD on 01/15/2007   Method used:  Electronically sent to ...       [facility] [address]       [place], [place]  [address]       Ph: [phone]       Fax: [phone]   RxID:   [id number] VYTORIN 10-20 MG  TABS (EZETIMIBE-SIMVASTATIN) Take 1 tablet by mouth once a day  #30 x prn   Entered and Authorized by:   Neena Rhymes MD   Signed by:   Neena Rhymes MD on 01/15/2007   Method used:   Electronically sent to ...       [facility] [address]       [place], [place]  [address]       Ph: [phone]       Fax: [phone]   RxID:   [id number] FUROSEMIDE 40 MG  TABS (FUROSEMIDE) 1 once daily  #30 x prn   Entered and Authorized by:   Neena Rhymes MD   Signed by:   Neena Rhymes MD on 01/15/2007   Method used:   Electronically sent to ...       [facility] [address]       [place], [place]  [address]       Ph: [phone]       Fax: [phone]   RxID:   [id number]  ]

## 2010-02-24 NOTE — Letter (Signed)
Summary: [facility]   Imported By: Phillis Knack 08/20/2008 08:45:39  _____________________________________________________________________  External Attachment:    Type:   Image     Comment:   External Document

## 2010-02-24 NOTE — Assessment & Plan Note (Signed)
Summary: cpx-lb   Vital Signs:  Patient profile:   77 year old male Height:      68 inches Weight:      193 pounds BMI:     29.45 O2 Sat:      97 % on Room air Temp:     97.4 degrees F oral Pulse rate:   68 / minute BP sitting:   118 / 72  (left arm) Cuff size:   regular  Vitals Entered By: Charlynne Cousins CMA (January 25, 2010 10:36 AM)  O2 Flow:  Room air CC: pt here for cpx/ ab  Does patient need assistance? Functional Status Self care Ambulation Normal  Vision Screening:      Vision Comments: pt had appt this month   Primary Care Provider:  Jolie Strohecker  CC:  pt here for cpx/ ab.  History of Present Illness: Patient presents for medicare wellness exam. He reports that he has been doing well. He has followed with Dr. Gaynelle Arabian in regard to prostate cancer and had a recent visit with a good report. He has had hormonal therapy. He does have erctile dystunction and has been usng cavernous injection therapy. He otherwise reports that his health has been good.    He is 100% independent in his ADLs. 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 No falls or fall risk. No sign or symptoms of depression.  Preventive Screening-Counseling & Management  Alcohol-Tobacco     Alcohol drinks/day: 0     Alcohol type: liquor     >5/day in last 3 mos: yes     Smoking Status: quit  Caffeine-Diet-Exercise     Caffeine use/day: 2 cups per day     Diet Comments: heart healthy     Diet Counseling: to improve diet; diet is suboptimal     Does Patient Exercise: no  Hep-HIV-STD-Contraception     Hepatitis Risk: no risk noted     HIV Risk: no risk noted     STD Risk: no risk noted     Dental Visit-last 6 months yes     Sun Exposure-Excessive: no  Safety-Violence-Falls     Seat Belt Use: yes     Helmet Use: n/a     Firearms in the Home: firearms in the home     Smoke Detectors: yes     Violence in the Home: no risk noted     Sexual Abuse: no     Fall Risk: low fall risk      Sexual History:  currently monogamous.        Drug Use:  never.        Blood Transfusions:  no.    Current Medications (verified): 1)  Omeprazole 40 Mg Cpdr (Omeprazole) .[name].. 1 Daily 2)  Simvastatin 40 Mg Tabs (Simvastatin) .[name].. 1 Tab Once Daily 3)  Lisinopril-Hydrochlorothiazide 10-12.5 Mg Tabs (Lisinopril-Hydrochlorothiazide) .[name].. 1 By Mouth Once Daily 4)  Omega-3 1000 Mg Caps (Omega-3 Fatty Acids) .... Take 1 Capsule By Mouth Two Times A Day  Allergies (verified): No Known Drug Allergies  Past History:  Past Medical History: Last updated: 01/21/2009 ELEVATED PROSTATE SPECIFIC ANTIGEN (ICD-790.93) LIBIDO, DECREASED (ICD-799.81) ADENOCARCINOMA, PROSTATE, GLEASON GRADE 5 (ICD-185) ESOPHAGITIS (ICD-530.10) HYPERTENSION (ICD-401.9) HYPERLIPIDEMIA (ICD-272.4) COLONIC POLYPS, HX OF (ICD-V12.72) ALLERGIC RHINITIS (ICD-477.9)      Past Surgical History: Last updated: 01/14/2007 Inguinal herniorrhaphy x 3  Family History: Last updated: Feb 16, 2008 father-died 25; lung cancer mother- deceased @89   positive HTN Brother -died cerebral aneurysm Neg- colon, prostate cancer: DM, CAD  Social History: Last updated: 01/21/2008 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  Social History: Caffeine use/day:  2 cups per day Does Patient Exercise:  no Dental Care w/in 6 mos.:  yes Sun Exposure-Excessive:  no Seat Belt Use:  yes Fall Risk:  low fall risk Blood Transfusions:  no Hepatitis Risk:  no risk noted HIV Risk:  no risk noted STD Risk:  no risk noted Sexual History:  currently monogamous Drug Use:  never  Review of Systems  The patient denies anorexia, fever, weight loss, weight gain, vision loss, decreased hearing, hoarseness, chest pain, syncope, dyspnea on exertion, peripheral edema, prolonged cough, headaches, hemoptysis,  abdominal pain, severe indigestion/heartburn, hematuria, incontinence, muscle weakness, suspicious skin lesions, difficulty walking, depression, abnormal bleeding, and testicular masses.    Physical Exam  General:  Well-developed,well-nourished,in no acute distress; alert,appropriate and cooperative throughout examination Head:  Normocephalic and atraumatic without obvious abnormalities. No apparent alopecia or balding. Eyes:  vision grossly intact, pupils equal, pupils round, corneas and lenses clear, no injection, no optic disk abnormalities, and no retinal abnormalitiies.   Ears:  External ear exam shows no significant lesions or deformities.  Otoscopic examination reveals clear canals, tympanic membranes are intact bilaterally without bulging, retraction, inflammation or discharge. Hearing is grossly normal bilaterally. Nose:  no external deformity and no external erythema.   Mouth:  Oral mucosa and oropharynx without lesions or exudates.  Teeth in good repair. Neck:  supple, full ROM, and no thyromegaly.   Chest Wall:  no deformities.   Lungs:  Normal respiratory effort, chest expands symmetrically. Lungs are clear to auscultation, no crackles or wheezes. Heart:  Normal rate and regular rhythm. S1 and S2 normal without gallop, murmur, click, rub or other extra sounds. Abdomen:  soft, non-tender, normal bowel sounds, no masses, no guarding, no rigidity, and no hepatomegaly.   Rectal:  deferred to GU Prostate:  deferred to GU Msk:  normal ROM, no joint tenderness, no joint swelling, no joint warmth, and no joint deformities.   Pulses:  2+ radial plses Extremities:  No clubbing, cyanosis, edema, or deformity noted with normal full range of motion of all joints.   Neurologic:  alert & oriented X3, cranial nerves II-XII intact, gait normal, and DTRs symmetrical and normal.   Skin:  turgor normal, color normal, no rashes, no suspicious lesions, and no ulcerations.   Cervical Nodes:  no anterior  cervical adenopathy and no posterior cervical adenopathy.   Psych:  Oriented X3, memory intact for recent and remote, normally interactive, good eye contact, and not anxious appearing.     Impression & Recommendations:  Problem # 1:  ADENOCARCINOMA, PROSTATE, GLEASON GRADE 5 (ICD-185) Patient doing well and has close follow-up with Dr. Gaynelle Arabian. He reports that results from cavrnous injections are less than when he started treatment. He does admit to decreased libido which may be due to low testosterone (goal of therapy)  Plan - per Dr. Gaynelle Arabian  Problem # 2:  HYPERTENSION (ICD-401.9)  His updated medication list for this problem includes:    Lisinopril-hydrochlorothiazide 10-12.5 Mg Tabs (Lisinopril-hydrochlorothiazide) .[name]... 1 by mouth once daily  Orders: TLB-BMP (Basic Metabolic Panel-BMET) ([id number]-HWTUUEK)  BP today: 118/72 Prior BP: 126/62 (01/21/2009)  Good control on present medications. Lab results are good: normal electrolytes and renal function.  Labs Reviewed: K+: 4.3 (01/21/2009) Creat: : 1.1 (  01/21/2009)   Chol: 187 (01/21/2009)   HDL: 42.20 (01/21/2009)   LDL: 96 (02/20/2008)   TG: 233.0 (01/21/2009)  Problem # 3:  HYPERLIPIDEMIA (ICD-272.4) Due for lab with recommendations to follow.  His updated medication list for this problem includes:    Simvastatin 40 Mg Tabs (Simvastatin) .[name]... 1 tab once daily  Orders: TLB-Lipid Panel (80061-LIPID) TLB-Hepatic/Liver Function Pnl (80076-HEPATIC)  Addendum: LDL at 131+ which is just above goal of 130 or less in patient with low to moderate risk for heart disease.  Plan - continue present dose of simvastatin           better life-style mgt with low fat diet, weight loss and more regular exercise.  Problem # 4:  Preventive Health Care (ICD-V70.0)  Interval medical history is unremarkable. Physical exam is normal. Lab results are within normal limits. He is current with colorectal cancer screening with last study Oct  '08. Immunizations - none recorded in EMR. Will check paper chart. Will need to bring up to date if deficient. 12 Lead EKG with Right bundle branch block otherwise unremarkable.  In summary - a delightful man who appears to be healthy and doing well. He is counseled on weight management: smart food choices - low fat and low carbohydrates; portion size management and regular exercise. Target weight 175 lbs with a goal of loosing 1 lb a month ( 18 months). He will return as needed or in 1 year.   Orders: Medicare -1st Annual Wellness Visit ([phone])  Complete Medication List: 1)  Omeprazole 40 Mg Cpdr (Omeprazole) .[name].. 1 daily 2)  Simvastatin 40 Mg Tabs (Simvastatin) .[name].. 1 tab once daily 3)  Lisinopril-hydrochlorothiazide 10-12.5 Mg Tabs (Lisinopril-hydrochlorothiazide) .[name].. 1 by mouth once daily 4)  Omega-3 1000 Mg Caps (Omega-3 fatty acids) .... Take 1 capsule by mouth two times a day  Other Orders: TLB-CBC Platelet - w/Differential (85025-CBCD)  tient: Austin [name] Note: All result statuses are Final unless otherwise noted.  Tests: (1) Lipid Panel (LIPID)   Cholesterol          [H]  205 mg/dL                   0-200     ATP III Classification            Desirable:  < 200 mg/dL                    Borderline High:  200 - 239 mg/dL               High:  > = 240 mg/dL   Triglycerides        [H]  362.0 mg/dL                 0.0-149.0     Normal:  <150 mg/dL     Borderline High:  150 - 199 mg/dL   HDL                  [L]  37.00 mg/dL                 >39.00   VLDL Cholesterol     [H]  72.4 mg/dL                  0.0-40.0  CHO/HDL Ratio:  CHD Risk  6                    Men          Women     1/2 Average Risk     3.4          3.3     Average Risk          5.0          4.4     2X Average Risk          9.6          7.1     3X Average Risk          15.0          11.0                           Tests: (2) Hepatic/Liver Function Panel (HEPATIC)   Total Bilirubin            0.8 mg/dL                   0.3-1.2   Direct Bilirubin          0.1 mg/dL                   0.0-0.3   Alkaline Phosphatase [L]  38 U/L                      39-117   AST                       21 U/L                      0-37   ALT                       21 U/L                      0-53   Total Protein             7.2 g/dL                    6.0-8.3   Albumin                   3.7 g/dL                    3.5-5.2  Tests: (3) BMP (METABOL)   Sodium                    140 mEq/L                   135-145   Potassium                 4.2 mEq/L                   3.5-5.1   Chloride                  103 mEq/L                   96-112   Carbon Dioxide            30 mEq/L  19-32   Glucose                   95 mg/dL                    70-99   BUN                       19 mg/dL                    6-23   Creatinine                1.4 mg/dL                   0.4-1.5   Calcium                   9.4 mg/dL                   8.4-10.5   GFR                  [L]  51.66 mL/min                >60.00  Tests: (4) CBC Platelet w/Diff (CBCD)   White Cell Count     [H]  10.8 K/uL                   4.5-10.5   Red Cell Count            4.56 Mil/uL                 4.22-5.81   Hemoglobin                14.8 g/dL                   13.0-17.0   Hematocrit                42.4 %                      39.0-52.0   MCV                       92.9 fl                     78.0-100.0   MCHC                      35.0 g/dL                   30.0-36.0   RDW                       13.6 %                      11.5-14.6   Platelet Count            188.0 K/uL                  150.0-400.0   Neutrophil %              73.5 %                      43.0-77.0   Lymphocyte %  13.3 %                      12.0-46.0   Monocyte %                11.1 %                      3.0-12.0   Eosinophils%              1.9 %                       0.0-5.0   Basophils %               0.2 %                       0.0-3.0    Neutrophill Absolute [H]  7.9 K/uL                    1.4-7.7   Lymphocyte Absolute       1.4 K/uL                    0.7-4.0   Monocyte Absolute    [H]  1.2 K/uL                    0.1-1.0  Eosinophils, Absolute                             0.2 K/uL                    0.0-0.7   Basophils Absolute        0.0 K/uL                    0.0-0.1  Tests: (5) Cholesterol LDL - Direct (DIRLDL)  Cholesterol LDL - Direct                             131.0 mg/dL     Optimal:  <100 mg/dL     Near or Above Optimal:  100-129 mg/dL     Borderline High:  130-159 mg/dL     High:  160-189 mg/dL     Very High:  >190 mg/dL  Orders Added: 1)  TLB-Lipid Panel [80061-LIPID] 2)  TLB-Hepatic/Liver Function Pnl [80076-HEPATIC] 3)  TLB-BMP (Basic Metabolic Panel-BMET) [67124-PYKDXIP] 4)  TLB-CBC Platelet - w/Differential [85025-CBCD] 5)  Medicare -1st Annual Wellness Visit [G0438] 6)  Est. Patient Level III [38250]

## 2010-02-24 NOTE — Letter (Signed)
[facility]   Imported By: Phillis Knack 11/30/2009 10:36:27  _____________________________________________________________________  External Attachment:    Type:   Image     Comment:   External Document

## 2010-02-24 NOTE — Consult Note (Signed)
Summary: [facility]   Imported By: Phillis Knack 04/19/2009 12:11:21  _____________________________________________________________________  External Attachment:    Type:   Image     Comment:   External Document

## 2010-02-24 NOTE — Letter (Signed)
   [facility] [place], [place]  [address] Phone: [phone]      April 19, 2008   Austin [name] [address] [place], [place] [address]  RE:  LAB RESULTS  Dear  Austin [name],  The following is an interpretation of your most recent lab tests.  Please take note of any instructions provided or changes to medications that have resulted from your lab work.  PSA:  high - further testing needed PSA: 7.27    After treatment for prostatitis and time the PSA is essentially unchanged and elevated from last year. At this point I recommend f/u with a urologist for further diagnostic evaluation that will probable include an ultrasound guided prostate biopsy. Let me know if you want our office to schedule an appointment for you.     Sincerely Yours,    Neena Rhymes MD  Appended Document:  Received return to sender on letter, I looked into [employer] and [address] address.

## 2010-02-24 NOTE — Consult Note (Signed)
Summary: [facility]   Imported By: Bubba Hales 09/04/2008 08:18:42  _____________________________________________________________________  External Attachment:    Type:   Image     Comment:   External Document

## 2010-02-24 NOTE — Letter (Signed)
Summary: [facility] Consult Scheduled Letter  [facility] [address]   [place], [place] [address]   Phone: [phone]  Fax: [phone]      05/20/2008 MRN: [id number]  Austin [name] [address] [place], [place]  [address]    Dear Austin [name],      We have scheduled an appointment for you.  At the recommendation of Austin.Norins, we have scheduled you a consult with Austin [name] on 06/03/08 at 12:45pm.  Their phone number is [phone].  If this appointment day and time is not convenient for you, please feel free to call the office of the doctor you are being referred to at the number listed above and reschedule the appointment.    [facility] [place],  [address]   Thank you,  Patient Care Coordinator [name]  Appended Document: [facility] Consult Scheduled Letter letter returned unable to forward

## 2010-02-24 NOTE — Consult Note (Signed)
Summary: [facility]  [facility]   Imported By: Bubba Hales 03/11/2009 08:07:43  _____________________________________________________________________  External Attachment:    Type:   Image     Comment:   External Document

## 2010-02-24 NOTE — Consult Note (Signed)
Summary: [facility]  [facility]   Imported By: Edmonia Linc 06/24/2008 12:35:34  _____________________________________________________________________  External Attachment:    Type:   Image     Comment:   External Document

## 2010-02-24 NOTE — Progress Notes (Signed)
Summary: LABS  Phone Note Call from Patient   Summary of Call: Pt recieved letter about lab results, he will wait 3 mths for recheck psa. Pt informed to come in april for labs. Only PSA? what dx? Initial call taken by: Charlsie Quest,  March 27, 2008 2:40 PM  Follow-up for Phone Call        PSA only 790.93 Follow-up by: Neena Rhymes MD,  March 27, 2008 6:29 PM  Additional Follow-up for Phone Call Additional follow up Details #1::        PSA LABS ORDER IS IN IDX. Additional Follow-up by: Glena Norfolk,  April 03, 2008 3:03 PM  New Problems: ELEVATED PROSTATE SPECIFIC ANTIGEN (ICD-790.93)   New Problems: ELEVATED PROSTATE SPECIFIC ANTIGEN (ICD-790.93)

## 2010-02-24 NOTE — Progress Notes (Signed)
Summary: Vytorin  Phone Note From Pharmacy   Caller: [facility] [phone] Summary of Call: The office rec'd note that the patient was supplied with Vytorin temporarily. Would like to change patient to a generic statin or initiate, please advise. Initial call taken by: Ernestene Mention,  March 05, 2009 9:20 AM  Follow-up for Phone Call        If the patient is will will change to simvastatin 40mg  once daily with repeat lipid panel 272.r, in 4 weeks after starting new med.  Follow-up by: Neena Rhymes MD,  March 05, 2009 9:30 AM  Additional Follow-up for Phone Call Additional follow up Details #1::        No answer........................Marland KitchenCharlsie Quest, CMA  March 05, 2009 4:40 PM     Additional Follow-up for Phone Call Additional follow up Details #2::    No answer/no machine. Ernestene Mention  March 08, 2009 4:32 PM   Additional Follow-up for Phone Call Additional follow up Details #3:: Details for Additional Follow-up Action Taken: pt informed of new medication. he will come in for labs in 4 weeks to repeat lipid. Additional Follow-up by: Ami Bullins CMA,  March 09, 2009 9:20 AM  New/Updated Medications: SIMVASTATIN 40 MG TABS (SIMVASTATIN) 1 tab once daily Prescriptions: SIMVASTATIN 40 MG TABS (SIMVASTATIN) 1 tab once daily  #30 x 3   Entered by:   Ami Bullins CMA   Authorized by:   Neena Rhymes MD   Signed by:   Charlynne Cousins CMA on 03/09/2009   Method used:   Faxed to ...       [facility] (retail)       [place] [address]       [place],   [address]       Ph: [phone]       Fax: [phone]   RxID:   [id number]

## 2010-02-24 NOTE — Letter (Signed)
Summary: [facility]  [facility]   Imported By: Jerrye Noble D'jimraou 11/15/2009 10:45:20  _____________________________________________________________________  External Attachment:    Type:   Image     Comment:   External Document

## 2010-04-18 LAB — POCT I-STAT 4, (NA,K, GLUC, HGB,HCT)
Glucose, Bld: 103 mg/dL — ABNORMAL HIGH (ref 70–99)
HCT: 41 % (ref 39.0–52.0)
Hemoglobin: 13.9 g/dL (ref 13.0–17.0)
Potassium: 4.7 mEq/L (ref 3.5–5.1)
Sodium: 143 mEq/L (ref 135–145)

## 2010-06-10 NOTE — Assessment & Plan Note (Signed)
[facility]                             PRIMARY CARE OFFICE NOTE   NAME:JOHNSONDrayk, Humbarger                      MRN:          [id number]  DATE:10/17/2005                            DOB:          Jul 23, 1932    Mr. Dubberly is a delightful 77 year old African-American gentleman, who  presented for follow-up evaluation and exam. He is followed for  hyperlipidemia and mild hypertension. He was last seen in the office on  07/28/05, for a sore throat which did resolve.   Of note, the patient has been followed for an elevated PSA.   Prostate-specific antigen:  1. 09/28/04      4.86  2. 03/14/05     4.74  3. 10/10/05     4.51   The patient had a normal prostate exam in the past.   INTERVAL HISTORY:  The patient reports he is feeling well and doing well,  with no active medical compliance at this time.   PAST MEDICAL HISTORY:  Past medical history is well-documented in previous  exam notes.   PAST SURGICAL HISTORY:  Inguinal hernia repair x3.   MEDICAL ILLNESSES:  1. Esophagitis.  2. Allergy.  3. History of colon polyps.  4. Hyperlipidemia.  5. Hypertension.   CURRENT MEDICATIONS:  1. Nexium 40 mg daily.  2. Vytorin 10/20 once daily.  3. Hydrochlorothiazide 12.5 mg daily.   REVIEW OF SYSTEMS:  Review of systems is negative for any constitutional,  cardiovascular, respiratory, GI, GU, problems and the patient has had an eye  exam in the last 12 months.   FAMILY HISTORY:  Family history is noncontributory.   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 He is taking some time off. He does have a cruise  plan for the Fall. He does feel that he maintains good health.   PHYSICAL EXAMINATION:  VITALS:  Temperature 96.8, blood pressure 140/78,  pulse 62, weight 187.  GENERAL APPEARANCE:  This is a mildly overweight African-American gentleman,  looks his stated age, in no acute distress.  HEENT:   Normocephalic, atraumatic. EACs with some soft cerumen, but I was  able to visualize the TMs, which appeared normal. Oropharynx with native  dentition, no buccal or palatal lesions were noted. Posterior pharynx was  clear. Conjunctivae and sclerae clear. PERRLA, EOMI. Funduscopic exam is  referred to ophthalmology.  NECK:  Supple, without thyromegaly. No adenopathy was noted in the cervical  or supraclavicular regions.  CHEST:  With CVA tenderness.  LUNGS:  Clear to auscultation and percussion.  CARDIOVASCULAR:  2+ radial pulses. No JVD or carotid bruits. He had a quiet  precordium with a regular rate and rhythm, without murmurs, rubs, or  gallops.  ABDOMEN:  Soft, no guarding or rebound, no organosplenomegaly was  appreciated.  RECTAL EXAM:  Revealed normal sphincter tone. Prostate was smooth, round,  normal in size and contour, with no abnormalities palpable.  EXTREMITIES:  Without clubbing, cyanosis, edema, or deformity.  NEUROLOGIC EXAM:  Nonfocal.  DERMATOLOGIC:  The patient has a raised flesh-colored lesion at  the left  face, just below the corner of his mouth. No other skin lesions are  appreciated.   DATA BASE:  Chemistries with a glucose of 107. Liver functions were normal.  Kidney function was normal with a GFR of 70 mL/minute. Cholesterol 164,d  triglycerides 155, HDL 33.9, LDL 99. PSA 4.51.   EKG: The patient did have a twelve-lead echocardiogram, which revealed that  he had a right bundle branch block with RSR prime, notable throughout the  precordial and lateral leads. The patient had T wave inversion in v1, but  otherwise this was an unremarkable study.   ASSESSMENT AND PLANS:  1. Hypertension.  The patient is adequately controlled on his present low-      dose diuretic and we will continue the same.   2.  Lipids.  The patient has had an excellent response to Vytorin and is at      goal with the LDL. His HDL is mildly decreased and this can be improved      with  increasing physical activity, as well as a low-carbohydrate diet.   3.  Genitourinary.  The patient has had a declining PSA over the last 16-18      months. He has had a normal examination. We did discuss this at length,      including the fact that an elevated PSA to this extent could be normal      for his age and the fact that he does not have any acceleration which      is encouraging, plus he has a normal examination. We discussed the      options of watchful waiting with follow-up laboratories versus urology      consult.      Plan: Watchful waiting is chosen. We will get a repeat PSA in six  months.   4.  Dermatologic. The patient with a suspicious lesion on the left face,      which could possibly be basal cell carcinoma. Plan: The patient is      referred to Marguerite Olea, M.D., who will see him at his [place]      office on 10/18/05 at 2 p.m. The patient is aware of this appointment.   5.  Health maintenance.  The patient reports that he is due for a follow-up      colonoscopy in 2008. At this moment, he cannot recall the name of his      gastroenterologist, but will try to get this information for me.   SUMMARY:  This is a very pleasant gentleman, who does seem medically stable  at this time. We will get a PSA in six months for follow-up, as discussed.                                   Heinz Knuckles Norins, MD   MEN/MedQ  DD:  10/17/2005  DT:  10/19/2005  Job #:  [id number]   cc:   [name]

## 2010-06-10 NOTE — Op Note (Signed)
NAME:  SAVIAN, MAZON NO.:  [id number]   MEDICAL RECORD NO.:  [id number]          PATIENT TYPE:  OIB   LOCATION:  2899                         FACILITY:  [facility]   PHYSICIAN:  Kathrin Penner, M.D.   DATE OF BIRTH:  [birth year]   DATE OF PROCEDURE:  01/19/2004  DATE OF DISCHARGE:                                 OPERATIVE REPORT   PREOPERATIVE DIAGNOSIS:  Recurrent left inguinal hernia.   POSTOPERATIVE DIAGNOSIS:  Recurrent left inguinal hernia.   PROCEDURE:  Repair recurrent left inguinal hernia with mesh.   SURGEON:  Kathrin Penner, M.D.   ASSISTANT:  Nurse.   ANESTHESIA:  General.   NOTE:  The patient is a 77 year old man who does a lot of heavy lifting.  He  had a repair for left-sided inguinal hernia approximately 20 years ago.  He  returns now with a recurrent hernia for repair.  He is aware of the risks  and potential benefits of surgery and gives consent to same.   PROCEDURE:  Following the induction of satisfactory general anesthesia, with  the patient positioned supine, the left groin is prepped and draped to be  included in the sterile operative field.  The left abdominal crease is then  infiltrated with 0.5% Marcaine with epinephrine and a transverse incision is  made through the old scar cicatrix, deepened through the skin subcutaneous  tissues, carried down to the external oblique aponeurosis.  This was opened  up through the external ring and the cord presenting at the internal ring  was a large inguinal hernia which had recurred in this position.  The hernia  was cleared from the surrounding fascia and reduced back into the  retroperitoneum.  I used a Bard plug, medium-sized, to hold the hernia back  in place and sutured this with a running suture of 2-0 Novofil to hold in  place.  I then placed a Bard patch over this polypropylene mesh and this was  sewn at the pubic tubercle and carried up along the conjoined tendon to the  internal ring and  again from the pubic tubercle along the shelving edge of  the inguinal ligament up to the internal ring.  The tails of the mesh were  then sutured down behind the cord in the internal oblique muscles.  The  repair was noted to be intact.  Sponge and instrument cards were verified.  The external oblique aponeurosis was closed over the cord with a running 2-0  Vicryl suture,  subcutaneous tissue was closed with a running 3-0 Vicryl suture and the skin  closed with a 4-0 Monocryl suture placed subcuticularly and then reinforced  with Steri-Strips.  Sterile dressings applied, the anesthetic reversed and  the patient removed from the operating room to the recovery room in stable  condition.  He tolerated the procedure well.      Patr   PB/MEDQ  D:  01/19/2004  T:  01/19/2004  Job:  [id number]   cc:   Kathrin Penner, M.D.  [address]  [address] [place]  [place] [address]  Fax: [phone]

## 2010-06-10 NOTE — Op Note (Signed)
NAME:  TEOMAN, GIRAUD NO.:  [id number]   MEDICAL RECORD NO.:  [id number]          PATIENT TYPE:  AMB   LOCATION:  DSC                          FACILITY:  [facility]   PHYSICIAN:  Kathrin Penner, M.D.   DATE OF BIRTH:  21-May-1932   DATE OF PROCEDURE:  07/11/2004  DATE OF DISCHARGE:                                 OPERATIVE REPORT   PREOPERATIVE DIAGNOSES:  1.  Epidermoid cyst of back.  2.  Seborrheic keratosis of left arm.   POSTOPERATIVE DIAGNOSES:  1.  Epidermoid cyst of back.  2.  Seborrheic keratosis of left arm.   PROCEDURES:  1.  Excision of epidermoid cyst of back.  2.  Excision of keratosis of left arm.   SURGEON:  Kathrin Penner, M.D.   ASSISTANT:  OR tech.   ANESTHESIA:  MAC using 0.5% Marcaine with epinephrine.   SPECIMENS TO PATHOLOGY:  Epidermoid cyst and keratosis.   ESTIMATED BLOOD LOSS:  Minimal.   COMPLICATIONS:  No apparent complications. Patient to the PACU in good  condition.   NOTE:  The patient is a 77 year old man with an enlarging epidermoid cyst of  his back which he desires taken off. He has had a keratosis on the upper  portion of his left arm over the deltoid which has been of some problem for  him and he wishes to have this removed also. He comes to the operating room  for the same after risks and potential benefits of surgery been discussed.  His questions have all been answered and consent obtained.   DESCRIPTION OF PROCEDURE:  The patient was prepped first, positioned in the  left lateral position and the after adequate sedation the lesion on the back  was prepped and draped to be included in a sterile operative field. I  infiltrated the region around the lesion with 0.5% Marcaine with epinephrine  and made an elliptical incision around the lesion, deepening through the  skin and subcutaneous tissues and excising the lesion in its entirety. This  was forwarded for pathologic evaluation. Flaps were mobilized both  laterally  so as to effect a tension-free closure. The subcutaneous tissues were closed  with interrupted 3-0 Vicryl sutures. The skin was closed with running 4-0  Monocryl suture and then reinforced with Steri-Strips. Attention was then  turned to lesion on the upper left arm where this was also infiltrated with  1% lidocaine with epinephrine following prepping and draping. The lesion was  grasped and excised in an ellipse. Hemostasis obtained with electrocautery  and the lesion was closed with interrupted sutures of 4-0 Monocryl. Sterile  dressings applied. The patient was then removed from the operating room to  the recovery room in stable condition. He tolerated the procedure well.       PB/MEDQ  D:  07/11/2004  T:  07/11/2004  Job:  [id number]

## 2010-06-10 NOTE — Letter (Signed)
April 02, 2007   Austin [name]. Austin Gaskins, MD, FCCP  [address],  [place] [address]   Re:  Austin, [name]               DOB:  [birth year]   Dear Austin [name],   I saw [name] today.  This 77 year old  Caucasian male was in  the hospital for myocardial infarction,  for which he had angioplasty of  is diagonal in January.  At the time, a chest x-ray showed a right lower  lobe lesion.  There is also a 1.1 cm right upper lobe ground-glass  nodule.  A PET scan was done and it showed no uptake in the right upper  lobe lesion but a malignant uptake in the right lower lobe lesion with  an SUV of 2.2.  There was no evidence of any adenopathy.  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  His pulmonary function tests are  pending.  His quit smoking 30 years ago.  He has had no hemoptysis,  fever, chills or excessive sputum.   MEDICATIONS:  1. Aspirin 81 mg a day.  2. Crestor 10 mg a day.  3. Lisinopril/hydrochlorothiazide 20/25 one daily.  4. Prilosec 20 mg daily.  5. Plavix 75 mg daily.  6. Amaryl 2 mg daily.  7. Metformin 500 mg daily.  8. Actos 5 mg daily.   FAMILY HISTORY:  Noncontributory.   SOCIAL HISTORY:  He is married, has 3 children.  He packs orders in a  warehouse.  Quit smoking 35 years ago.  Does not drink alcohol on a  regular basis.   REVIEW OF SYSTEMS:  He is 217 pounds.  He is 6 feet.  His weight has  been stable.  CARDIAC:  See history of present illness.  PULMONARY:  See history of present illness.  GASTROINTESTINAL:  Has reflux.  GENITOURINARY:  No kidney disease, dysuria or frequent urination.  VASCULAR:  No claudication, DVT, TIA's.  NEUROLOGIC:  No headaches, blackout or seizures.  MUSCULOSKELETAL:  No arthritis or joint ache.  PSYCHIATRIC:  No illnesses.  No changes eyes, head or hair.  No problems with bleeding or clotting  disorders.   PHYSICAL EXAMINATION:  He is a well developed Caucasian male in no acute  distress.  Head, Eyes, Ears,  Nose and Throat:  Unremarkable.  Neck:  Supple without  thyromegaly.  Chest:  Clear to auscultation and percussion.  Heart:  Regular sinus rhythm.  No murmurs.  Abdomen:  Soft.  There is no  hepatosplenomegaly.  Pulses are 2+.  There is no clubbing or edema.  Neurologic:  He is oriented x3.  Sensory and motor intact.  Cranial  nerves are intact.    I feel that he would benefit from a right lower lobe superior  segmentectomy.  Unfortunately, the right upper lobe nodule is very small  and deep in the lung.  I doubt that it would be amenable to resection at  the same time, and we will probably need to follow it.  If this is a  bronchoalveolar cancer, this could also be a bronchoalveolar cancer in  the upper lobe but I would not resect it unless I could easily palpate  it.  We plan to do his surgery on the 27th at [facility].  I  appreciate the opportunity of seeing Austin [name].   Sincerely,   Nicanor Alcon, M.D.  Electronically Signed   DPB/MEDQ  D:  04/02/2007  T:  04/03/2007  Job:  [id number]

## 2010-12-26 DIAGNOSIS — N529 Male erectile dysfunction, unspecified: Secondary | ICD-10-CM | POA: Insufficient documentation

## 2011-01-23 ENCOUNTER — Encounter: Payer: Self-pay | Admitting: Internal Medicine

## 2011-01-27 ENCOUNTER — Encounter: Payer: Self-pay | Admitting: Internal Medicine

## 2011-02-01 ENCOUNTER — Ambulatory Visit (INDEPENDENT_AMBULATORY_CARE_PROVIDER_SITE_OTHER): Payer: Medicare Other | Admitting: Internal Medicine

## 2011-02-01 ENCOUNTER — Other Ambulatory Visit (INDEPENDENT_AMBULATORY_CARE_PROVIDER_SITE_OTHER): Payer: Medicare Other

## 2011-02-01 ENCOUNTER — Other Ambulatory Visit: Payer: Self-pay | Admitting: Internal Medicine

## 2011-02-01 VITALS — BP 136/82 | HR 64 | Temp 97.6°F | Wt 188.0 lb

## 2011-02-01 DIAGNOSIS — Z0000 Encounter for general adult medical examination without abnormal findings: Secondary | ICD-10-CM

## 2011-02-01 DIAGNOSIS — E785 Hyperlipidemia, unspecified: Secondary | ICD-10-CM

## 2011-02-01 DIAGNOSIS — C61 Malignant neoplasm of prostate: Secondary | ICD-10-CM

## 2011-02-01 DIAGNOSIS — I10 Essential (primary) hypertension: Secondary | ICD-10-CM

## 2011-02-01 LAB — HEPATIC FUNCTION PANEL
ALT: 17 U/L (ref 0–53)
AST: 18 U/L (ref 0–37)
Albumin: 4.3 g/dL (ref 3.5–5.2)
Alkaline Phosphatase: 45 U/L (ref 39–117)
Bilirubin, Direct: 0.1 mg/dL (ref 0.0–0.3)
Total Bilirubin: 0.8 mg/dL (ref 0.3–1.2)
Total Protein: 7.7 g/dL (ref 6.0–8.3)

## 2011-02-01 LAB — COMPREHENSIVE METABOLIC PANEL
ALT: 17 U/L (ref 0–53)
AST: 18 U/L (ref 0–37)
Albumin: 4.3 g/dL (ref 3.5–5.2)
Alkaline Phosphatase: 45 U/L (ref 39–117)
BUN: 21 mg/dL (ref 6–23)
CO2: 28 mEq/L (ref 19–32)
Calcium: 9.3 mg/dL (ref 8.4–10.5)
Chloride: 105 mEq/L (ref 96–112)
Creatinine, Ser: 1.3 mg/dL (ref 0.4–1.5)
GFR: 57.55 mL/min — ABNORMAL LOW (ref >60.00)
Glucose, Bld: 90 mg/dL (ref 70–99)
Potassium: 3.9 mEq/L (ref 3.5–5.1)
Sodium: 143 mEq/L (ref 135–145)
Total Bilirubin: 0.8 mg/dL (ref 0.3–1.2)
Total Protein: 7.7 g/dL (ref 6.0–8.3)

## 2011-02-01 LAB — LIPID PANEL
Cholesterol: 225 mg/dL — ABNORMAL HIGH (ref 0–200)
HDL: 42.6 mg/dL (ref >39.00)
Total CHOL/HDL Ratio: 5
Triglycerides: 141 mg/dL (ref 0.0–149.0)
VLDL: 28.2 mg/dL (ref 0.0–40.0)

## 2011-02-01 LAB — TSH: TSH: 1.24 u[IU]/mL (ref 0.35–5.50)

## 2011-02-01 NOTE — Progress Notes (Signed)
Subjective:    Patient ID: Austin [name], male    DOB: Feb 28, 1932, 78 y.o.   MRN: [id number]  HPI The patient is here for annual Medicare wellness examination and management of other chronic and acute problems.  Interval history negative for any major illness, surgery or injury.   The risk factors are reflected in the social history.  The roster of all physicians providing medical care to patient - is listed in the Snapshot section of the chart.  Activities of daily living:  The patient is 100% inedpendent in all ADLs: dressing, toileting, feeding as well as independent mobility  Home safety : The patient has smoke detectors in the home. Fall - home is safe. Is going to redo bathroom-grab bars. They wear seatbelts.  firearms are present in the home, kept in a safe fashion. There is no violence in the home.   There is no risks for hepatitis, STDs or HIV. There is no   history of blood transfusion. They have no travel history to infectious disease endemic areas of the world.  The patient has  seen their dentist in the last six month. They have seen their eye doctor in the last year. They deny any hearing difficulty and have not had audiologic testing in the last year.  They do not  have excessive sun exposure. Discussed the need for sun protection: hats, long sleeves and use of sunscreen if there is significant sun exposure.   Diet: the importance of a healthy diet is discussed. They do have a healthy diet.  The patient has a regular exercise program.  The benefits of regular aerobic exercise were discussed. He plans to resume walking.   Depression screen: there are no signs or vegative symptoms of depression- irritability, change in appetite, anhedonia, sadness/tearfullness.  Cognitive assessment: the patient manages all their financial and personal affairs and is actively engaged.   The following portions of the patient's history were reviewed and updated as appropriate: allergies,  current medications, past family history, past medical history,  past surgical history, past social history  and problem list.  Vision, hearing, body mass index were assessed and reviewed.   During the course of the visit the patient was educated and counseled about appropriate screening and preventive services including : fall prevention , diabetes screening, nutrition counseling, colorectal cancer screening, and recommended immunizations.  Past Medical History  Diagnosis Date  . ADENOCARCINOMA, PROSTATE, GLEASON GRADE 5 01/21/2009  . ALLERGIC RHINITIS 01/14/2007  . COLONIC POLYPS, HX OF 01/14/2007  . ELEVATED PROSTATE SPECIFIC ANTIGEN 03/27/2008  . ESOPHAGITIS 01/14/2007  . HYPERLIPIDEMIA 01/14/2007  . HYPERTENSION 01/14/2007  . LIBIDO, DECREASED 01/15/2007   Past Surgical History  Procedure Date  . Hernia repair    Family History  Problem Relation Age of Onset  . Lung cancer Father   . Hypertension Other    History   Social History  . Marital Status: Married    Spouse Name: N/A    Number of Children: 3  . Years of Education: 16   Occupational History  . Mortician    Social History Main Topics  . Smoking status: Not on file  . Smokeless tobacco: Not on file  . Alcohol Use:   . Drug Use:   . Sexually Active:    Other Topics Concern  . Not on file   Social History Narrative   Trained mortician. Married 1961. 2 sons- '63, '69 & 1 daughter '55Grandchildren 5. 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 Working a semi-retired  schedule as of Jan '13. Discussion needed in regard to [place], e.g. Living will, MOST        Review of Systems Constitutional:  Negative for fever, chills, activity change and unexpected weight change.  HEENT:  Negative for hearing loss, ear pain, congestion, neck stiffness and postnasal drip. Negative for sore throat or swallowing problems. Negative for dental complaints.   Eyes: Negative for vision loss or change in visual acuity.    Respiratory: Negative for chest tightness and wheezing. Negative for DOE.   Cardiovascular: Negative for chest pain or palpitations. No decreased exercise tolerance Gastrointestinal: No change in bowel habit. No bloating or gas. No reflux or indigestion Genitourinary: Negative for urgency, frequency, flank pain and difficulty urinating.  Musculoskeletal: Negative for myalgias, back pain, arthralgias and gait problem.  Neurological: Negative for dizziness, tremors, weakness and headaches.  Hematological: Negative for adenopathy.  Psychiatric/Behavioral: Negative for behavioral problems and dysphoric mood.       Objective:   Physical Exam Vital signs reviewed Gen'l: Well nourished well developed AA male in no acute distress  HEENT: Head: Normocephalic and atraumatic. Right Ear: External ear normal. EAC-cerumen impaction/TM nl. Left Ear: External ear normal.  EAC-cerumen/TM nl. Nose: Nose normal. Mouth/Throat: Oropharynx is clear and moist. Dentition - native, in good repair. No buccal or palatal lesions. Posterior pharynx clear. Eyes: Conjunctivae and sclera clear. EOM intact. Pupils are equal, round, and reactive to light. Right eye exhibits no discharge. Left eye exhibits no discharge. Neck: Normal range of motion. Neck supple. No JVD present. No tracheal deviation present. No thyromegaly present.  Cardiovascular: Normal rate, regular rhythm, no gallop, no friction rub, no murmur heard.      Quiet precordium. 2+ radial and DP pulses . No carotid bruits Pulmonary/Chest: Effort normal. No respiratory distress or increased WOB, no wheezes, no rales. No chest wall deformity or CVAT. Abdominal: Soft. Bowel sounds are normal in all quadrants. He exhibits no distension, no tenderness, no rebound or guarding, No heptosplenomegaly  Genitourinary:  deferred to Dr. Gaynelle Arabian Musculoskeletal: Normal range of motion. He exhibits no edema and no tenderness.       Small and large joints without redness,  synovial thickening or deformity. Full range of motion preserved about all small, median and large joints. Modrated discomfort back moving sitting to supine  Lymphadenopathy:    He has no cervical or supraclavicular adenopathy.  Neurological: He is alert and oriented to person, place, and time. CN II-XII intact. DTRs 2+ and symmetrical biceps, radial and patellar tendons. Cerebellar function normal with no tremor, rigidity, normal gait and station.  Skin: Skin is warm and dry. No rash noted. No erythema.  Psychiatric: He has a normal mood and affect. His behavior is normal. Thought content normal.   Lab Results  Component Value Date   WBC 10.8* 01/25/2010   HGB 14.8 01/25/2010   HCT 42.4 01/25/2010   PLT 188.0 01/25/2010   GLUCOSE 90 02/01/2011   CHOL 225* 02/01/2011   TRIG 141.0 02/01/2011   HDL 42.60 02/01/2011   LDLDIRECT 138.5 02/01/2011   LDLCALC 96 02/20/2008   ALT 17 02/01/2011   ALT 17 02/01/2011   AST 18 02/01/2011   AST 18 02/01/2011   NA 143 02/01/2011   K 3.9 02/01/2011   CL 105 02/01/2011   CREATININE 1.3 02/01/2011   BUN 21 02/01/2011   CO2 28 02/01/2011   TSH 1.24 02/01/2011   PSA 7.27* 04/14/2008  Assessment & Plan:

## 2011-02-02 LAB — LDL CHOLESTEROL, DIRECT: Direct LDL: 138.5 mg/dL

## 2011-02-04 ENCOUNTER — Encounter: Payer: Self-pay | Admitting: Internal Medicine

## 2011-02-04 DIAGNOSIS — Z0000 Encounter for general adult medical examination without abnormal findings: Secondary | ICD-10-CM | POA: Insufficient documentation

## 2011-02-04 MED ORDER — EZETIMIBE-SIMVASTATIN 10-40 MG PO TABS: 1.0000 | ORAL_TABLET | Freq: Every day | ORAL | Status: DC

## 2011-02-04 NOTE — Assessment & Plan Note (Signed)
Current with visits to Dr. Gaynelle Arabian. He is evidently doing well. PSA's and monitoring per Dr. Gaynelle Arabian

## 2011-02-04 NOTE — Assessment & Plan Note (Addendum)
Last lab with LDL just above goal of 130 or lower; HDL is in normal range. Cardia risk to moderate at these levels.  Plan - change medication to vytorin 10/40 daily due to increased cardiac risk at 26% chance of a cardiac event in the next 10 years.

## 2011-02-04 NOTE — Assessment & Plan Note (Signed)
Interval medical history is negative. He has stable prostate disease and is followed by Dr. Gaynelle Arabian. He has had no serious illness, surgery or injury. He is current with colorectal cancer screening. Immunizations are not up to date. He is offered by declines immunizations today.  In summary - a very nice man who has taken good care of himself. He is medically stable but will need adjustment of antilipemic medications. He is encouraged to increase his aerobic exercise. He will return for follow-up lab 4-6 weeks after changing to vytorin. He will otherwise return in one year, earlier if needed.

## 2011-02-04 NOTE — Assessment & Plan Note (Addendum)
BP Readings from Last 3 Encounters:  02/01/11 136/82  01/25/10 118/72  01/21/09 126/62   Pattern of generally acceptable blood pressure readings.  Plan - continue present medication           Life-style management: increased aerobic exercise, mild amount of weight loss

## 2011-02-06 ENCOUNTER — Encounter: Payer: Self-pay | Admitting: Internal Medicine

## 2011-02-06 DIAGNOSIS — H2510 Age-related nuclear cataract, unspecified eye: Secondary | ICD-10-CM | POA: Diagnosis not present

## 2011-02-06 DIAGNOSIS — H04129 Dry eye syndrome of unspecified lacrimal gland: Secondary | ICD-10-CM | POA: Diagnosis not present

## 2011-02-10 ENCOUNTER — Telehealth: Payer: Self-pay | Admitting: *Deleted

## 2011-02-10 NOTE — Telephone Encounter (Signed)
Spoke with pt's wife. Notified her of the prescription change & pt needs to come back in for lab work after being on new prescription for 4 wks.

## 2011-07-10 DIAGNOSIS — C61 Malignant neoplasm of prostate: Secondary | ICD-10-CM | POA: Diagnosis not present

## 2011-07-17 DIAGNOSIS — C61 Malignant neoplasm of prostate: Secondary | ICD-10-CM | POA: Diagnosis not present

## 2011-08-09 ENCOUNTER — Ambulatory Visit (INDEPENDENT_AMBULATORY_CARE_PROVIDER_SITE_OTHER): Payer: Medicare Other

## 2011-08-09 ENCOUNTER — Ambulatory Visit (INDEPENDENT_AMBULATORY_CARE_PROVIDER_SITE_OTHER): Payer: Medicare Other | Admitting: Orthopedic Surgery

## 2011-08-09 ENCOUNTER — Encounter: Payer: Self-pay | Admitting: Orthopedic Surgery

## 2011-08-09 VITALS — BP 90/60 | Ht 68.0 in | Wt 187.0 lb

## 2011-08-09 DIAGNOSIS — M75101 Unspecified rotator cuff tear or rupture of right shoulder, not specified as traumatic: Secondary | ICD-10-CM

## 2011-08-09 DIAGNOSIS — M25511 Pain in right shoulder: Secondary | ICD-10-CM

## 2011-08-09 DIAGNOSIS — S43429A Sprain of unspecified rotator cuff capsule, initial encounter: Secondary | ICD-10-CM | POA: Diagnosis not present

## 2011-08-09 DIAGNOSIS — M25519 Pain in unspecified shoulder: Secondary | ICD-10-CM

## 2011-08-09 NOTE — Patient Instructions (Addendum)
You have received a steroid shot. 15% of patients experience increased pain at the injection site with in the next 24 hours. This is best treated with ice and tylenol extra strength 2 tabs every 8 hours. If you are still having pain please call the office.   Exercises x 6 weeks   Rotator Cuff Tear The rotator cuff is four tendons that assist in the motion of the shoulder. A rotator cuff tear is a tear in one of these four tendons. It is characterized by pain and weakness of the shoulder. The rotator cuff tendons surround the shoulder ball and socket joint (humeral head). The rotator cuff tendons attach to the shoulder blade (scapula) on one side and the upper arm bone (humerus) on the other side. The rotator cuff is essential for shoulder stability and shoulder motion. SYMPTOMS    Pain around the shoulder, often at the outer portion of the upper arm.   Pain that is worse with shoulder function, especially when reaching overhead or lifting.   Weakness of the shoulder muscles.   Aching when not using your arm; often, pain awakens you at night, especially when sleeping on the affected side.   Tenderness, swelling, warmth, or redness over the outer aspect of the shoulder.   Loss of strength.   Limited motion of the shoulder, especially reaching behind (reaching into one's back pocket) or across your body.   A crackling sound (crepitation) when moving the shoulder.   Biceps tendon pain (in the front of the shoulder) and inflammation, worse with bending the elbow or lifting.  CAUSES    Strain from sudden increase in amount or intensity of activity.   Direct blow or injury to the shoulder.   Aging, wear from from normal use.   Roof of the shoulder (acromial) spur.  RISK INCREASES WITH:    Contact sports (football, wrestling, or boxing).   Throwing or hitting sports (baseball, tennis, or volleyball).   Weightlifting and bodybuilding.   Heavy labor.   Previous injury to rotator  cuff.   Failure to warm up properly before activity.   Inadequate protective equipment.   Increasing age.   Spurring of the outer end of the scapula (acromion).   Cortisone injections.   Poor shoulder strength and flexibility.  PREVENTION  Warm up and stretch properly before activity.   Allow time for rest and recovery between practices and competition.   Maintain physical fitness:   Cardiovascular fitness.   Shoulder flexibility.   Strength and endurance of the rotator cuff muscles and muscles of the shoulder blade.   Learn and use proper technique when throwing or hitting.  PROGNOSIS Surgery is often needed. Although, symptoms may go away by themselves. RELATED COMPLICATIONS    Persistent pain that may progress to constant pain.   Shoulder stiffness, frozen shoulder syndrome, or loss of motion.   Recurrence of symptoms, especially if treated without surgery.   Inability to return to same level of sports, even with surgery.   Persistent weakness.   Risks of surgery, including infection, bleeding, injury to nerves, shoulder stiffness, weakness, re-tearing of the rotator cuff tendon.   Deltoid detachment, acromial fracture, and persistent pain.  TREATMENT Treatment involves the use of ice and medicine to reduce pain and inflammation. Strengthening and stretching exercise are usually recommended. These exercises may be completed at home or with a therapist. You may also be instructed to modify offending activities. Corticosteroid injections may be given to reduce inflammation. Surgery is usually recommended for  athletes. Surgery has the best chance for a full recovery. Surgery involves:  Removal of an inflamed bursa.   Removal of an acromial spur if present.   Suturing the torn tendon back together.  Rotator cuff surgeries may be preformed either arthroscopically or through an open incision. Recovery typically takes 6 to 12 months. MEDICATION  If pain medicine is  necessary, then nonsteroidal anti-inflammatory medicines, such as aspirin and ibuprofen, or other minor pain relievers, such as acetaminophen, are often recommended.   Do not take pain medicine for 7 days before surgery.   Prescription pain relievers are usually only prescribed after surgery. Use only as directed and only as much as you need.   Corticosteroid injections may be given to reduce inflammation. However, there is a limited number of times the joint may be injected with these medicines.  HEAT AND COLD  Cold treatment (icing) relieves pain and reduces inflammation. Cold treatment should be applied for 10 to 15 minutes every 2 to 3 hours for inflammation and pain and immediately after any activity that aggravates your symptoms. Use ice packs or massage the area with a piece of ice (ice massage).   Heat treatment may be used prior to performing the stretching and strengthening activities prescribed by your caregiver, physical therapist, or athletic trainer. Use a heat pack or soak the injury in warm water.  SEEK MEDICAL CARE IF:    Symptoms get worse or do not improve in 4 to 6 weeks despite treatment.   You experience pain, numbness, or coldness in the hand.   Blue, gray, or dark color appears in the fingernails.   New, unexplained symptoms develop (drugs used in treatment may produce side effects).  Document Released: 01/09/2005 Document Revised: 12/29/2010 Document Reviewed: 04/23/2008 [facility] Patient Information 2012 [place].

## 2011-08-10 ENCOUNTER — Encounter: Payer: Self-pay | Admitting: Orthopedic Surgery

## 2011-08-10 DIAGNOSIS — M75101 Unspecified rotator cuff tear or rupture of right shoulder, not specified as traumatic: Secondary | ICD-10-CM | POA: Insufficient documentation

## 2011-08-10 NOTE — Progress Notes (Signed)
Patient ID: Austin [name], male   DOB: [birth year], 78 y.o.   MRN: [id number] Chief Complaint  Patient presents with  . Shoulder Pain    right shoulder pain x 2 years, gradual onset, no known injury    BP 90/60  Ht 5' 8"  (1.727 m)  Wt 84.823 kg (187 lb)  BMI 28.43 kg/m2  Shoulder Pain  The pain is present in the right shoulder. This is a new problem. The current episode started more than 1 year ago. There has been no history of extremity trauma. The quality of the pain is described as pounding and aching. The pain is at a severity of 3/10. Associated symptoms include a limited range of motion and stiffness. Pertinent negatives include no fever, itching, joint locking, joint swelling, numbness or tingling.   Review of Systems  Constitutional: Negative for fever.  Musculoskeletal: Positive for stiffness.  Skin: Negative for itching.  Neurological: Negative for tingling and numbness.   Joint pain and seasonal allergies weight gain  Past Medical History  Diagnosis Date  . ADENOCARCINOMA, PROSTATE, GLEASON GRADE 5 01/21/2009  . ALLERGIC RHINITIS 01/14/2007  . COLONIC POLYPS, HX OF 01/14/2007  . ELEVATED PROSTATE SPECIFIC ANTIGEN 03/27/2008  . ESOPHAGITIS 01/14/2007  . HYPERLIPIDEMIA 01/14/2007  . HYPERTENSION 01/14/2007  . LIBIDO, DECREASED 01/15/2007    Past Surgical History  Procedure Date  . Hernia repair    Vital signs are stable as recorded  General appearance is normal  The patient is alert and oriented x3  The patient's mood and affect are normal  Gait assessment: normal The cardiovascular exam reveals normal pulses and temperature without edema swelling.  The lymphatic system is negative for palpable lymph nodes  The sensory exam is normal.  There are no pathologic reflexes.  Balance is normal.   Skin normal  Right Shoulder Exam   Tenderness  The patient is experiencing no tenderness.    Range of Motion  Active Abduction: abnormal  Passive  Abduction: normal  Extension: normal  Forward Flexion: abnormal Right shoulder forward flexion: 130.  External Rotation: normal  Internal Rotation 0 degrees: abnormal   Muscle Strength  Internal Rotation: 5/5  External Rotation: 5/5  Supraspinatus: 4/5    Left Shoulder Exam  Left shoulder exam is normal.  Tenderness  The patient is experiencing no tenderness.     Range of Motion  The patient has normal left shoulder ROM.  Muscle Strength  The patient has normal left shoulder strength.      X-rays show chronic rotator cuff disease type changes with sclerotic greater tuberosity  Type II acromion  Impression mild rotator cuff tear  Recommend nonoperative treatment with exercises and subacromial injection  Shoulder Injection Procedure Note   Pre-operative Diagnosis: right  RC Syndrome  Post-operative Diagnosis: same  Indications: pain   Anesthesia: ethyl chloride   Procedure Details   Verbal consent was obtained for the procedure. The shoulder was prepped withalcohol and the skin was anesthetized. A 20 gauge needle was advanced into the subacromial space through posterior approach without difficulty  The space was then injected with 3 ml 1% lidocaine and 1 ml of depomedrol. The injection site was cleansed with isopropyl alcohol and a dressing was applied.  Complications:  None; patient tolerated the procedure well.

## 2011-12-19 ENCOUNTER — Other Ambulatory Visit: Payer: Self-pay | Admitting: Gastroenterology

## 2011-12-19 DIAGNOSIS — Z09 Encounter for follow-up examination after completed treatment for conditions other than malignant neoplasm: Secondary | ICD-10-CM | POA: Diagnosis not present

## 2011-12-19 DIAGNOSIS — D129 Benign neoplasm of anus and anal canal: Secondary | ICD-10-CM | POA: Diagnosis not present

## 2011-12-19 DIAGNOSIS — Z86010 Personal history of colonic polyps: Secondary | ICD-10-CM | POA: Diagnosis not present

## 2011-12-19 DIAGNOSIS — Z800 Family history of malignant neoplasm of digestive organs: Secondary | ICD-10-CM | POA: Diagnosis not present

## 2011-12-19 DIAGNOSIS — K648 Other hemorrhoids: Secondary | ICD-10-CM | POA: Diagnosis not present

## 2011-12-19 DIAGNOSIS — D128 Benign neoplasm of rectum: Secondary | ICD-10-CM | POA: Diagnosis not present

## 2011-12-19 DIAGNOSIS — D126 Benign neoplasm of colon, unspecified: Secondary | ICD-10-CM | POA: Diagnosis not present

## 2011-12-19 DIAGNOSIS — K5730 Diverticulosis of large intestine without perforation or abscess without bleeding: Secondary | ICD-10-CM | POA: Diagnosis not present

## 2012-01-25 DIAGNOSIS — C61 Malignant neoplasm of prostate: Secondary | ICD-10-CM | POA: Diagnosis not present

## 2012-01-29 DIAGNOSIS — N529 Male erectile dysfunction, unspecified: Secondary | ICD-10-CM | POA: Diagnosis not present

## 2012-01-29 DIAGNOSIS — R972 Elevated prostate specific antigen [PSA]: Secondary | ICD-10-CM | POA: Diagnosis not present

## 2012-01-29 DIAGNOSIS — C61 Malignant neoplasm of prostate: Secondary | ICD-10-CM | POA: Diagnosis not present

## 2012-02-05 ENCOUNTER — Encounter: Payer: Self-pay | Admitting: Internal Medicine

## 2012-02-05 ENCOUNTER — Other Ambulatory Visit (INDEPENDENT_AMBULATORY_CARE_PROVIDER_SITE_OTHER): Payer: Medicare Other

## 2012-02-05 ENCOUNTER — Ambulatory Visit (INDEPENDENT_AMBULATORY_CARE_PROVIDER_SITE_OTHER): Payer: Medicare Other | Admitting: Internal Medicine

## 2012-02-05 VITALS — BP 118/70 | HR 64 | Temp 97.9°F | Resp 10 | Ht 66.0 in | Wt 187.8 lb

## 2012-02-05 DIAGNOSIS — I10 Essential (primary) hypertension: Secondary | ICD-10-CM

## 2012-02-05 DIAGNOSIS — C61 Malignant neoplasm of prostate: Secondary | ICD-10-CM

## 2012-02-05 DIAGNOSIS — S43429A Sprain of unspecified rotator cuff capsule, initial encounter: Secondary | ICD-10-CM

## 2012-02-05 DIAGNOSIS — E785 Hyperlipidemia, unspecified: Secondary | ICD-10-CM | POA: Diagnosis not present

## 2012-02-05 DIAGNOSIS — Z0000 Encounter for general adult medical examination without abnormal findings: Secondary | ICD-10-CM

## 2012-02-05 DIAGNOSIS — M75101 Unspecified rotator cuff tear or rupture of right shoulder, not specified as traumatic: Secondary | ICD-10-CM

## 2012-02-05 LAB — COMPREHENSIVE METABOLIC PANEL
ALT: 16 U/L (ref 0–53)
AST: 15 U/L (ref 0–37)
Albumin: 4 g/dL (ref 3.5–5.2)
Alkaline Phosphatase: 46 U/L (ref 39–117)
BUN: 23 mg/dL (ref 6–23)
CO2: 29 mEq/L (ref 19–32)
Calcium: 9.3 mg/dL (ref 8.4–10.5)
Chloride: 103 mEq/L (ref 96–112)
Creatinine, Ser: 1.5 mg/dL (ref 0.4–1.5)
GFR: 49.76 mL/min — ABNORMAL LOW (ref >60.00)
Glucose, Bld: 95 mg/dL (ref 70–99)
Potassium: 4.1 mEq/L (ref 3.5–5.1)
Sodium: 139 mEq/L (ref 135–145)
Total Bilirubin: 0.8 mg/dL (ref 0.3–1.2)
Total Protein: 7.6 g/dL (ref 6.0–8.3)

## 2012-02-05 LAB — HEPATIC FUNCTION PANEL
ALT: 16 U/L (ref 0–53)
AST: 15 U/L (ref 0–37)
Albumin: 4 g/dL (ref 3.5–5.2)
Alkaline Phosphatase: 46 U/L (ref 39–117)
Bilirubin, Direct: 0.1 mg/dL (ref 0.0–0.3)
Total Bilirubin: 0.8 mg/dL (ref 0.3–1.2)
Total Protein: 7.6 g/dL (ref 6.0–8.3)

## 2012-02-05 LAB — LIPID PANEL
Cholesterol: 229 mg/dL — ABNORMAL HIGH (ref 0–200)
HDL: 35.8 mg/dL — ABNORMAL LOW (ref >39.00)
Total CHOL/HDL Ratio: 6
Triglycerides: 243 mg/dL — ABNORMAL HIGH (ref 0.0–149.0)
VLDL: 48.6 mg/dL — ABNORMAL HIGH (ref 0.0–40.0)

## 2012-02-05 LAB — LDL CHOLESTEROL, DIRECT: Direct LDL: 138.6 mg/dL

## 2012-02-05 NOTE — Patient Instructions (Addendum)
Thanks for coming to see me.  Your exam is fine. Labs for today and you will receive a report or by Mychart.  Please develop a regular exercise program: 3 days a week for 30 minutes with a target heart rate of 120; 2 days a week of flex/stretch  Return as needed or in 1 year

## 2012-02-05 NOTE — Progress Notes (Signed)
Subjective:    Patient ID: Austin [name], male    DOB: 12/25/1932, 79 y.o.   MRN: [id number]  HPI The patient is here for annual Medicare wellness examination and management of other chronic and acute problems.  Interval history has been unremarkable. He has had no major illness, injury or surgery.   The risk factors are reflected in the social history.  The roster of all physicians providing medical care to patient - is listed in the Snapshot section of the chart.  Activities of daily living:  The patient is 100% inedpendent in all ADLs: dressing, toileting, feeding as well as independent mobility  Home safety : The patient has smoke detectors in the home. Falls - none. Has a fall safe home and plans to install grab bar. They wear seatbelts.  firearms are present in the home, kept in a safe fashion. There is no violence in the home.   There is no risks for hepatitis, STDs or HIV. There is no  history of blood transfusion. They have no travel history to infectious disease endemic areas of the world.  The patient has seen their dentist in the last six month. They have  seen their eye doctor in the last year. They deny any hearing difficulty and have not had audiologic testing in the last year.    They do not  have excessive sun exposure. Discussed the need for sun protection: hats, long sleeves and use of sunscreen if there is significant sun exposure.   Diet: the importance of a healthy diet is discussed. They do have a healthy diet.  The patient has no regular exercise program.  The benefits of regular aerobic exercise were discussed.  Depression screen: there are no signs or vegative symptoms of depression  Cognitive assessment: the patient manages all their financial and personal affairs and is actively engaged.   The following portions of the patient's history were reviewed and updated as appropriate: allergies, current medications, past family history, past medical history,   past surgical history, past social history  and problem list.  Past Medical History  Diagnosis Date  . ADENOCARCINOMA, PROSTATE, GLEASON GRADE 5 01/21/2009  . ALLERGIC RHINITIS 01/14/2007  . COLONIC POLYPS, HX OF 01/14/2007  . ELEVATED PROSTATE SPECIFIC ANTIGEN 03/27/2008  . ESOPHAGITIS 01/14/2007  . HYPERLIPIDEMIA 01/14/2007  . HYPERTENSION 01/14/2007  . LIBIDO, DECREASED 01/15/2007   Past Surgical History  Procedure Date  . Hernia repair    Family History  Problem Relation Age of Onset  . Lung cancer Father   . Hypertension Other   . Arthritis     History   Social History  . Marital Status: Married    Spouse Name: N/A    Number of Children: 3  . Years of Education: 16   Occupational History  . Mortician    Social History Main Topics  . Smoking status: Never Smoker   . Smokeless tobacco: Not on file  . Alcohol Use: No  . Drug Use: No  . Sexually Active: Not on file   Other Topics Concern  . Not on file   Social History Narrative   Trained mortician. Married 1961. 2 sons- '63, '69 & 1 daughter '55Grandchildren 5. 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 Working a semi-retired schedule as of Jan '13. Discussion needed in regard to Louise, e.g. Living will, MOST   Current Outpatient Prescriptions on File Prior to Visit  Medication Sig Dispense Refill  . lisinopril-hydrochlorothiazide (PRINZIDE,ZESTORETIC) 10-12.5 MG per tablet Take 1  tablet by mouth daily.        [name] omeprazole (PRILOSEC) 40 MG capsule Take 40 mg by mouth daily.        . simvastatin (ZOCOR) 40 MG tablet Take 40 mg by mouth every evening.         Vision, hearing, body mass index were assessed and reviewed.   During the course of the visit the patient was educated and counseled about appropriate screening and preventive services including : fall prevention , diabetes screening, nutrition counseling, colorectal cancer screening, and recommended immunizations.    Review of Systems Constitutional:   Negative for fever, chills, activity change and unexpected weight change.  HEENT:  Negative for hearing loss, ear pain, congestion, neck stiffness and postnasal drip. Negative for sore throat or swallowing problems. Negative for dental complaints.   Eyes: Negative for vision loss or change in visual acuity.  Respiratory: Negative for chest tightness and wheezing. Negative for DOE.   Cardiovascular: Negative for chest pain or palpitations. No decreased exercise tolerance Gastrointestinal: No change in bowel habit. No bloating or gas. No reflux or indigestion Genitourinary: Negative for urgency, frequency, flank pain and difficulty urinating.  Musculoskeletal: Negative for myalgias, back pain, arthralgias and gait problem.  Neurological: Negative for dizziness, tremors, weakness and headaches.  Hematological: Negative for adenopathy.  Psychiatric/Behavioral: Negative for behavioral problems and dysphoric mood.       Objective:   Physical Exam Filed Vitals:   02/05/12 1330  BP: 118/70  Pulse: 64  Temp: 97.9 F (36.6 C)  Resp: 10   Wt Readings from Last 3 Encounters:  02/05/12 187 lb 12.8 oz (85.186 kg)  08/09/11 187 lb (84.823 kg)  02/01/11 188 lb (85.276 kg)   Gen'l: Well nourished well developed     male in no acute distress  HEENT: Head: Normocephalic and atraumatic. Right Ear: External ear normal. EAC/TM nl. Left Ear: External ear normal.  EAC/TM nl. Nose: Nose normal. Mouth/Throat: Oropharynx is clear and moist. Dentition - native, in good repair. No buccal or palatal lesions. Posterior pharynx clear. Eyes: Conjunctivae and sclera clear. EOM intact. Pupils are equal, round, and reactive to light. Right eye exhibits no discharge. Left eye exhibits no discharge. Neck: Normal range of motion. Neck supple. No JVD present. No tracheal deviation present. No thyromegaly present.  Cardiovascular: Normal rate, regular rhythm, no gallop, no friction rub, no murmur heard.      Quiet  precordium. 2+ radial and DP pulses . No carotid bruits Pulmonary/Chest: Effort normal. No respiratory distress or increased WOB, no wheezes, no rales. No chest wall deformity or CVAT. Abdomen: Soft. Bowel sounds are normal in all quadrants. He exhibits no distension, no tenderness, no rebound or guarding, No heptosplenomegaly  Genitourinary:   Musculoskeletal: Normal range of motion. He exhibits no edema and no tenderness.       Small and large joints without redness, synovial thickening or deformity. Full range of motion preserved about all small, median and large joints.  Lymphadenopathy:    He has no cervical or supraclavicular adenopathy.  Neurological: He is alert and oriented to person, place, and time. CN II-XII intact. DTRs 2+ and symmetrical biceps, radial and patellar tendons. Cerebellar function normal with no tremor, rigidity, normal gait and station.  Skin: Skin is warm and dry. No rash noted. No erythema.  Psychiatric: He has a normal mood and affect. His behavior is normal. Thought content normal.   Lab Results  Component Value Date   WBC 10.8* 01/25/2010  HGB 14.8 01/25/2010   HCT 42.4 01/25/2010   PLT 188.0 01/25/2010   GLUCOSE 95 02/05/2012   CHOL 229* 02/05/2012   TRIG 243.0* 02/05/2012   HDL 35.80* 02/05/2012   LDLDIRECT 138.6 02/05/2012   LDLCALC 96 02/20/2008        ALT 16 02/05/2012   AST 15 02/05/2012        NA 139 02/05/2012   K 4.1 02/05/2012   CL 103 02/05/2012   CREATININE 1.5 02/05/2012   BUN 23 02/05/2012   CO2 29 02/05/2012   TSH 1.24 02/01/2011   PSA 7.27* 04/14/2008           Assessment & Plan:

## 2012-02-06 MED ORDER — EZETIMIBE-SIMVASTATIN 10-40 MG PO TABS: 1.0000 | ORAL_TABLET | Freq: Every day | ORAL | Status: DC

## 2012-02-06 NOTE — Assessment & Plan Note (Signed)
LDL at 138 is above goal of 100 or less. HDL is just a little low.  Plan - change to Vytorin (Zetia/zocor) 10/40 once a day for better control. New Rx to pharmacy  Follow up lab 4 weeks after making med change (order entered)

## 2012-02-06 NOTE — Assessment & Plan Note (Signed)
BP Readings from Last 3 Encounters:  02/05/12 118/70  08/09/11 90/60  02/01/11 136/82   Very good control of BP on present medication.  Plan  Continue present regimen

## 2012-02-06 NOTE — Assessment & Plan Note (Signed)
Followed closely by Dr. Gaynelle Arabian. By patient's report he is doing well.

## 2012-02-06 NOTE — Assessment & Plan Note (Signed)
Stable injury for which he is not seeking surgical intervention. He is able to do all his usual activities without restriction.

## 2012-02-06 NOTE — Assessment & Plan Note (Signed)
Interval history - rotator cuff injury right shoulder but no limitation in activity. History is otherwise normal. Physical exam is normal, sans GU exam, except for extra weight. Lab results are in normal range except for LDL being above goal. He is current with colorectal cancer screening and follows with Dr. Gaynelle Arabian in regard to prostate cancer. Offered immunizations which he declines.  In summary - a very nice man who is medically stable and fit. He is advised to develop a aerobic and flex/stretch exercise program - a job not a leisure time activity. Benefits of the this investment reviewed. He will return in 1 year or sooner as needed.

## 2012-02-08 DIAGNOSIS — H2510 Age-related nuclear cataract, unspecified eye: Secondary | ICD-10-CM | POA: Diagnosis not present

## 2012-07-29 DIAGNOSIS — C61 Malignant neoplasm of prostate: Secondary | ICD-10-CM | POA: Diagnosis not present

## 2013-01-07 ENCOUNTER — Encounter: Payer: Self-pay | Admitting: Internal Medicine

## 2013-01-07 ENCOUNTER — Ambulatory Visit (INDEPENDENT_AMBULATORY_CARE_PROVIDER_SITE_OTHER): Payer: Medicare Other | Admitting: Internal Medicine

## 2013-01-07 VITALS — BP 98/60 | HR 89 | Temp 99.1°F | Wt 185.8 lb

## 2013-01-07 DIAGNOSIS — J069 Acute upper respiratory infection, unspecified: Secondary | ICD-10-CM

## 2013-01-07 MED ORDER — PROMETHAZINE-CODEINE 6.25-10 MG/5ML PO SYRP: 5.0000 mL | ORAL_SOLUTION | Freq: Every evening | ORAL | Status: DC | PRN

## 2013-01-07 MED ORDER — BENZONATATE 100 MG PO CAPS: 100.0000 mg | ORAL_CAPSULE | Freq: Three times a day (TID) | ORAL | Status: DC | PRN

## 2013-01-07 NOTE — Progress Notes (Signed)
   Subjective:    Patient ID: Austin [name], male    DOB: 1932-11-12, 79 y.o.   MRN: [id number]  HPI Austin [name] presents for a history of fever with cough starting 4 days ago. He has had no SOB, N/V/D, he has no myalgias or other flu like symptoms. He rested well yesterday and does feel better.  PMH, FamHx and SocHx reviewed for any changes and relevance. Current Outpatient Prescriptions on File Prior to Visit  Medication Sig Dispense Refill  . ezetimibe-simvastatin (VYTORIN) 10-40 MG per tablet Take 1 tablet by mouth at bedtime.  30 tablet  11  . lisinopril-hydrochlorothiazide (PRINZIDE,ZESTORETIC) 10-12.5 MG per tablet Take 1 tablet by mouth daily.        [name] omeprazole (PRILOSEC) 40 MG capsule Take 40 mg by mouth daily.         No current facility-administered medications on file prior to visit.      Review of Systems System review is negative for any constitutional, cardiac, pulmonary, GI or neuro symptoms or complaints other than as described in the HPI.     Objective:   Physical Exam Filed Vitals:   01/07/13 1125  BP: 98/60  Pulse: 89  Temp: 99.1 F (37.3 C)   BP Readings from Last 3 Encounters:  01/07/13 98/60  02/05/12 118/70  08/09/11 90/60   Gen'l - WNWD man looking younger than his age 56 - cerumen impaction rigth EAC, Left TM normal, throat clear, no sinus tenderness Nodes - negative neck Cor 2+ radial, RRR Pulm - normal respirations, no rales or wheezes        Assessment & Plan:  Viral upper respiratory infection with cough - no evidence of any bacterial infection, thus no need for antibiotics..  Plan Phenergan with codeine 1-2 tsp at bedtime to get through the night without cough  Robitussin DM or the equivalent during the daytime  Tessalon perles 100 mg three times a day. This is a type of anesthetic for the tracheal tickle cough  Hydrate - juice, ginger ale, etc.  For any fever tylenol 500 mg 2 tablets three times a day.

## 2013-01-07 NOTE — Progress Notes (Signed)
Pre visit review using our clinic review tool, if applicable. No additional management support is needed unless otherwise documented below in the visit note. 

## 2013-01-07 NOTE — Patient Instructions (Signed)
Viral upper respiratory infection with cough - no evidence of any bacterial infection, thus no need for antibiotics..  Plan Phenergan with codeine 1-2 tsp at bedtime to get through the night without cough  Robitussin DM or the equivalent during the daytime  Tessalon perles 100 mg three times a day. This is a type of anesthetic for the tracheal tickle cough  Hydrate - juice, ginger ale, etc.  For any fever tylenol 500 mg 2 tablets three times a day.  Upper Respiratory Infection, Adult An upper respiratory infection (URI) is also sometimes known as the common cold. The upper respiratory tract includes the nose, sinuses, throat, trachea, and bronchi. Bronchi are the airways leading to the lungs. Most people improve within 1 week, but symptoms can last up to 2 weeks. A residual cough may last even longer.  CAUSES Many different viruses can infect the tissues lining the upper respiratory tract. The tissues become irritated and inflamed and often become very moist. Mucus production is also common. A cold is contagious. You can easily spread the virus to others by oral contact. This includes kissing, sharing a glass, coughing, or sneezing. Touching your mouth or nose and then touching a surface, which is then touched by another person, can also spread the virus. SYMPTOMS  Symptoms typically develop 1 to 3 days after you come in contact with a cold virus. Symptoms vary from person to person. They may include:  Runny nose.  Sneezing.  Nasal congestion.  Sinus irritation.  Sore throat.  Loss of voice (laryngitis).  Cough.  Fatigue.  Muscle aches.  Loss of appetite.  Headache.  Low-grade fever. DIAGNOSIS  You might diagnose your own cold based on familiar symptoms, since most people get a cold 2 to 3 times a year. Your caregiver can confirm this based on your exam. Most importantly, your caregiver can check that your symptoms are not due to another disease such as strep throat, sinusitis,  pneumonia, asthma, or epiglottitis. Blood tests, throat tests, and X-rays are not necessary to diagnose a common cold, but they may sometimes be helpful in excluding other more serious diseases. Your caregiver will decide if any further tests are required. RISKS AND COMPLICATIONS  You may be at risk for a more severe case of the common cold if you smoke cigarettes, have chronic heart disease (such as heart failure) or lung disease (such as asthma), or if you have a weakened immune system. The very young and very old are also at risk for more serious infections. Bacterial sinusitis, middle ear infections, and bacterial pneumonia can complicate the common cold. The common cold can worsen asthma and chronic obstructive pulmonary disease (COPD). Sometimes, these complications can require emergency medical care and may be life-threatening. PREVENTION  The best way to protect against getting a cold is to practice good hygiene. Avoid oral or hand contact with people with cold symptoms. Wash your hands often if contact occurs. There is no clear evidence that vitamin C, vitamin E, echinacea, or exercise reduces the chance of developing a cold. However, it is always recommended to get plenty of rest and practice good nutrition. TREATMENT  Treatment is directed at relieving symptoms. There is no cure. Antibiotics are not effective, because the infection is caused by a virus, not by bacteria. Treatment may include:  Increased fluid intake. Sports drinks offer valuable electrolytes, sugars, and fluids.  Breathing heated mist or steam (vaporizer or shower).  Eating chicken soup or other clear broths, and maintaining good nutrition.  Getting plenty of rest.  Using gargles or lozenges for comfort.  Controlling fevers with ibuprofen or acetaminophen as directed by your caregiver.  Increasing usage of your inhaler if you have asthma. Zinc gel and zinc lozenges, taken in the first 24 hours of the common cold, can  shorten the duration and lessen the severity of symptoms. Pain medicines may help with fever, muscle aches, and throat pain. A variety of non-prescription medicines are available to treat congestion and runny nose. Your caregiver can make recommendations and may suggest nasal or lung inhalers for other symptoms.  HOME CARE INSTRUCTIONS   Only take over-the-counter or prescription medicines for pain, discomfort, or fever as directed by your caregiver.  Use a warm mist humidifier or inhale steam from a shower to increase air moisture. This may keep secretions moist and make it easier to breathe.  Drink enough water and fluids to keep your urine clear or pale yellow.  Rest as needed.  Return to work when your temperature has returned to normal or as your caregiver advises. You may need to stay home longer to avoid infecting others. You can also use a face mask and careful hand washing to prevent spread of the virus. SEEK MEDICAL CARE IF:   After the first few days, you feel you are getting worse rather than better.  You need your caregiver's advice about medicines to control symptoms.  You develop chills, worsening shortness of breath, or brown or red sputum. These may be signs of pneumonia.  You develop yellow or brown nasal discharge or pain in the face, especially when you bend forward. These may be signs of sinusitis.  You develop a fever, swollen neck glands, pain with swallowing, or white areas in the back of your throat. These may be signs of strep throat. SEEK IMMEDIATE MEDICAL CARE IF:   You have a fever.  You develop severe or persistent headache, ear pain, sinus pain, or chest pain.  You develop wheezing, a prolonged cough, cough up blood, or have a change in your usual mucus (if you have chronic lung disease).  You develop sore muscles or a stiff neck. Document Released: 07/05/2000 Document Revised: 04/03/2011 Document Reviewed: 05/13/2010 [facility] Patient Information 2014  [place], [place].

## 2013-01-08 ENCOUNTER — Telehealth: Payer: Self-pay

## 2013-01-08 NOTE — Telephone Encounter (Signed)
Patient looked through his paperwork and he states there is not a printed script so this has been called to [facility].

## 2013-01-08 NOTE — Telephone Encounter (Signed)
Phone call from patient [phone] and states he was seen Tues and was to be given Phenergan w/codeine

## 2013-01-08 NOTE — Telephone Encounter (Signed)
Record indicates RX was done for promethazine/c codeine.   Did he check his paperwork (AVS) to see if there was a printed prescription? If he has no Rx we can call it in for him.

## 2013-02-21 DIAGNOSIS — H43819 Vitreous degeneration, unspecified eye: Secondary | ICD-10-CM | POA: Diagnosis not present

## 2013-02-21 DIAGNOSIS — H25019 Cortical age-related cataract, unspecified eye: Secondary | ICD-10-CM | POA: Diagnosis not present

## 2013-02-21 DIAGNOSIS — H01009 Unspecified blepharitis unspecified eye, unspecified eyelid: Secondary | ICD-10-CM | POA: Diagnosis not present

## 2013-02-21 DIAGNOSIS — H2510 Age-related nuclear cataract, unspecified eye: Secondary | ICD-10-CM | POA: Diagnosis not present

## 2013-03-12 DIAGNOSIS — H2510 Age-related nuclear cataract, unspecified eye: Secondary | ICD-10-CM | POA: Diagnosis not present

## 2013-03-12 DIAGNOSIS — H2589 Other age-related cataract: Secondary | ICD-10-CM | POA: Diagnosis not present

## 2013-03-12 DIAGNOSIS — H25019 Cortical age-related cataract, unspecified eye: Secondary | ICD-10-CM | POA: Diagnosis not present

## 2013-04-01 DIAGNOSIS — R972 Elevated prostate specific antigen [PSA]: Secondary | ICD-10-CM | POA: Diagnosis not present

## 2013-04-01 DIAGNOSIS — C61 Malignant neoplasm of prostate: Secondary | ICD-10-CM | POA: Diagnosis not present

## 2013-04-03 ENCOUNTER — Encounter: Payer: Self-pay | Admitting: Internal Medicine

## 2013-04-03 ENCOUNTER — Ambulatory Visit (INDEPENDENT_AMBULATORY_CARE_PROVIDER_SITE_OTHER): Payer: Medicare Other | Admitting: Internal Medicine

## 2013-04-03 ENCOUNTER — Other Ambulatory Visit (INDEPENDENT_AMBULATORY_CARE_PROVIDER_SITE_OTHER): Payer: Medicare Other

## 2013-04-03 VITALS — BP 128/78 | HR 67 | Temp 98.0°F | Wt 181.4 lb

## 2013-04-03 DIAGNOSIS — I10 Essential (primary) hypertension: Secondary | ICD-10-CM

## 2013-04-03 DIAGNOSIS — E785 Hyperlipidemia, unspecified: Secondary | ICD-10-CM

## 2013-04-03 DIAGNOSIS — Z86010 Personal history of colonic polyps: Secondary | ICD-10-CM

## 2013-04-03 DIAGNOSIS — Z0000 Encounter for general adult medical examination without abnormal findings: Secondary | ICD-10-CM

## 2013-04-03 DIAGNOSIS — Z7189 Other specified counseling: Secondary | ICD-10-CM

## 2013-04-03 LAB — LIPID PANEL
Cholesterol: 208 mg/dL — ABNORMAL HIGH (ref 0–200)
HDL: 38.8 mg/dL — ABNORMAL LOW (ref >39.00)
LDL Cholesterol: 110 mg/dL — ABNORMAL HIGH (ref 0–99)
Total CHOL/HDL Ratio: 5
Triglycerides: 298 mg/dL — ABNORMAL HIGH (ref 0.0–149.0)
VLDL: 59.6 mg/dL — ABNORMAL HIGH (ref 0.0–40.0)

## 2013-04-03 LAB — COMPREHENSIVE METABOLIC PANEL
ALT: 19 U/L (ref 0–53)
AST: 17 U/L (ref 0–37)
Albumin: 4.1 g/dL (ref 3.5–5.2)
Alkaline Phosphatase: 48 U/L (ref 39–117)
BUN: 22 mg/dL (ref 6–23)
CO2: 31 mEq/L (ref 19–32)
Calcium: 9.7 mg/dL (ref 8.4–10.5)
Chloride: 105 mEq/L (ref 96–112)
Creatinine, Ser: 1.5 mg/dL (ref 0.4–1.5)
GFR: 48.08 mL/min — ABNORMAL LOW (ref >60.00)
Glucose, Bld: 101 mg/dL — ABNORMAL HIGH (ref 70–99)
Potassium: 5.1 mEq/L (ref 3.5–5.1)
Sodium: 141 mEq/L (ref 135–145)
Total Bilirubin: 0.9 mg/dL (ref 0.3–1.2)
Total Protein: 7.4 g/dL (ref 6.0–8.3)
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
## 2013-04-03 NOTE — Patient Instructions (Signed)
Thanks for coming to see me over these last few years. For continuing care you will be transferred to Dr. Jenny Reichmann  Your exam is normal. Will be get labs today and will mail you the results.  You are current with your immunizations but I do recommend you consider the Prevnar 13 vaccine to maximize protection against pneumonia

## 2013-04-03 NOTE — Progress Notes (Signed)
Pre visit review using our clinic review tool, if applicable. No additional management support is needed unless otherwise documented below in the visit note. 

## 2013-04-04 ENCOUNTER — Encounter: Payer: Self-pay | Admitting: Internal Medicine

## 2013-04-06 DIAGNOSIS — Z7189 Other specified counseling: Secondary | ICD-10-CM | POA: Insufficient documentation

## 2013-04-06 NOTE — Assessment & Plan Note (Signed)
Current with colonoscopy - due for follow up in 5-7 years due to hyperplastic polyp.

## 2013-04-06 NOTE — Assessment & Plan Note (Signed)
BP Readings from Last 3 Encounters:  04/03/13 128/78  01/07/13 98/60  02/05/12 118/70   Adequate control on present medications. Bmet normal  Plan Continue present regimen

## 2013-04-06 NOTE — Assessment & Plan Note (Signed)
Interval history has been stable with no major illness, surgery or injury. Physical exam is normal, GU deferred to Dr. Gaynelle Arabian. Lab results reviewed - normal range. He is current with colorectal cancer screening. Immunizations - he has consistently declined. ACP reviewed.  In summary - a vigorous and healthy main who is medically stable and doing well. He will return in 1 year, sooner as needed.

## 2013-04-06 NOTE — Assessment & Plan Note (Signed)
Taking and tolerating "statin" therapy. Last lab reveals LDL that is better than goal of 130 or less and HDL is close to goal of 40. Triglycerides are mildly elevated and will be addressed with diet.   Plan Continue present medications.

## 2013-04-08 DIAGNOSIS — E559 Vitamin D deficiency, unspecified: Secondary | ICD-10-CM | POA: Diagnosis not present

## 2013-04-08 DIAGNOSIS — C61 Malignant neoplasm of prostate: Secondary | ICD-10-CM | POA: Diagnosis not present

## 2013-04-08 DIAGNOSIS — M899 Disorder of bone, unspecified: Secondary | ICD-10-CM | POA: Diagnosis not present

## 2013-04-08 DIAGNOSIS — R972 Elevated prostate specific antigen [PSA]: Secondary | ICD-10-CM | POA: Diagnosis not present

## 2013-04-08 DIAGNOSIS — M949 Disorder of cartilage, unspecified: Secondary | ICD-10-CM | POA: Diagnosis not present

## 2013-04-16 DIAGNOSIS — H2510 Age-related nuclear cataract, unspecified eye: Secondary | ICD-10-CM | POA: Diagnosis not present

## 2013-04-16 DIAGNOSIS — H2589 Other age-related cataract: Secondary | ICD-10-CM | POA: Diagnosis not present

## 2013-07-21 DIAGNOSIS — T6391XA Toxic effect of contact with unspecified venomous animal, accidental (unintentional), initial encounter: Secondary | ICD-10-CM | POA: Diagnosis not present

## 2013-08-15 ENCOUNTER — Encounter (HOSPITAL_COMMUNITY): Admission: EM | Disposition: A | Discharge status: Rehabilitation Facility | Payer: Self-pay | Source: Home / Self Care

## 2013-08-15 ENCOUNTER — Inpatient Hospital Stay (HOSPITAL_COMMUNITY): Payer: Medicare Other

## 2013-08-15 ENCOUNTER — Emergency Department (HOSPITAL_COMMUNITY): Payer: Medicare Other

## 2013-08-15 ENCOUNTER — Inpatient Hospital Stay (HOSPITAL_COMMUNITY)
Admission: EM | Admit: 2013-08-15 | Discharge: 2013-09-03 | DRG: 023 | Disposition: A | Discharge status: Rehabilitation Facility | Payer: Medicare Other | Attending: General Surgery | Admitting: General Surgery

## 2013-08-15 ENCOUNTER — Encounter (HOSPITAL_COMMUNITY): Payer: Self-pay | Admitting: General Surgery

## 2013-08-15 DIAGNOSIS — R0602 Shortness of breath: Secondary | ICD-10-CM | POA: Diagnosis not present

## 2013-08-15 DIAGNOSIS — S1193XA Puncture wound without foreign body of unspecified part of neck, initial encounter: Secondary | ICD-10-CM

## 2013-08-15 DIAGNOSIS — S298XXA Other specified injuries of thorax, initial encounter: Secondary | ICD-10-CM | POA: Diagnosis not present

## 2013-08-15 DIAGNOSIS — Z6825 Body mass index (BMI) 25.0-25.9, adult: Secondary | ICD-10-CM | POA: Diagnosis not present

## 2013-08-15 DIAGNOSIS — S065XAA Traumatic subdural hemorrhage with loss of consciousness status unknown, initial encounter: Secondary | ICD-10-CM | POA: Diagnosis not present

## 2013-08-15 DIAGNOSIS — Z87891 Personal history of nicotine dependence: Secondary | ICD-10-CM

## 2013-08-15 DIAGNOSIS — R1310 Dysphagia, unspecified: Secondary | ICD-10-CM | POA: Diagnosis present

## 2013-08-15 DIAGNOSIS — S3981XA Other specified injuries of abdomen, initial encounter: Secondary | ICD-10-CM | POA: Diagnosis not present

## 2013-08-15 DIAGNOSIS — S069XAA Unspecified intracranial injury with loss of consciousness status unknown, initial encounter: Secondary | ICD-10-CM

## 2013-08-15 DIAGNOSIS — S065X9A Traumatic subdural hemorrhage with loss of consciousness of unspecified duration, initial encounter: Principal | ICD-10-CM

## 2013-08-15 DIAGNOSIS — J156 Pneumonia due to other aerobic Gram-negative bacteria: Secondary | ICD-10-CM | POA: Diagnosis not present

## 2013-08-15 DIAGNOSIS — S61509A Unspecified open wound of unspecified wrist, initial encounter: Secondary | ICD-10-CM | POA: Diagnosis present

## 2013-08-15 DIAGNOSIS — W3400XA Accidental discharge from unspecified firearms or gun, initial encounter: Secondary | ICD-10-CM | POA: Diagnosis not present

## 2013-08-15 DIAGNOSIS — S62318B Displaced fracture of base of other metacarpal bone, initial encounter for open fracture: Secondary | ICD-10-CM | POA: Diagnosis present

## 2013-08-15 DIAGNOSIS — S0291XA Unspecified fracture of skull, initial encounter for closed fracture: Secondary | ICD-10-CM

## 2013-08-15 DIAGNOSIS — E7800 Pure hypercholesterolemia, unspecified: Secondary | ICD-10-CM | POA: Diagnosis present

## 2013-08-15 DIAGNOSIS — G8110 Spastic hemiplegia affecting unspecified side: Secondary | ICD-10-CM | POA: Diagnosis not present

## 2013-08-15 DIAGNOSIS — S1093XA Contusion of unspecified part of neck, initial encounter: Secondary | ICD-10-CM | POA: Diagnosis not present

## 2013-08-15 DIAGNOSIS — N179 Acute kidney failure, unspecified: Secondary | ICD-10-CM | POA: Diagnosis not present

## 2013-08-15 DIAGNOSIS — S62319A Displaced fracture of base of unspecified metacarpal bone, initial encounter for closed fracture: Secondary | ICD-10-CM | POA: Diagnosis not present

## 2013-08-15 DIAGNOSIS — S61431A Puncture wound without foreign body of right hand, initial encounter: Secondary | ICD-10-CM

## 2013-08-15 DIAGNOSIS — S41009A Unspecified open wound of unspecified shoulder, initial encounter: Secondary | ICD-10-CM | POA: Diagnosis present

## 2013-08-15 DIAGNOSIS — S21109A Unspecified open wound of unspecified front wall of thorax without penetration into thoracic cavity, initial encounter: Secondary | ICD-10-CM | POA: Diagnosis not present

## 2013-08-15 DIAGNOSIS — Z5189 Encounter for other specified aftercare: Secondary | ICD-10-CM | POA: Diagnosis not present

## 2013-08-15 DIAGNOSIS — G8190 Hemiplegia, unspecified affecting unspecified side: Secondary | ICD-10-CM | POA: Diagnosis present

## 2013-08-15 DIAGNOSIS — S0083XA Contusion of other part of head, initial encounter: Secondary | ICD-10-CM | POA: Diagnosis not present

## 2013-08-15 DIAGNOSIS — S066X9A Traumatic subarachnoid hemorrhage with loss of consciousness of unspecified duration, initial encounter: Secondary | ICD-10-CM | POA: Diagnosis not present

## 2013-08-15 DIAGNOSIS — S020XXA Fracture of vault of skull, initial encounter for closed fracture: Secondary | ICD-10-CM | POA: Diagnosis not present

## 2013-08-15 DIAGNOSIS — J9819 Other pulmonary collapse: Secondary | ICD-10-CM | POA: Diagnosis not present

## 2013-08-15 DIAGNOSIS — S0100XA Unspecified open wound of scalp, initial encounter: Secondary | ICD-10-CM | POA: Diagnosis not present

## 2013-08-15 DIAGNOSIS — S0630AA Unspecified focal traumatic brain injury with loss of consciousness status unknown, initial encounter: Secondary | ICD-10-CM | POA: Diagnosis not present

## 2013-08-15 DIAGNOSIS — S069X9A Unspecified intracranial injury with loss of consciousness of unspecified duration, initial encounter: Secondary | ICD-10-CM | POA: Diagnosis not present

## 2013-08-15 DIAGNOSIS — J1569 Pneumonia due to other gram-negative bacteria: Secondary | ICD-10-CM | POA: Diagnosis not present

## 2013-08-15 DIAGNOSIS — S0180XA Unspecified open wound of other part of head, initial encounter: Secondary | ICD-10-CM | POA: Diagnosis not present

## 2013-08-15 DIAGNOSIS — S0633AA Contusion and laceration of cerebrum, unspecified, with loss of consciousness status unknown, initial encounter: Secondary | ICD-10-CM | POA: Diagnosis not present

## 2013-08-15 DIAGNOSIS — D62 Acute posthemorrhagic anemia: Secondary | ICD-10-CM | POA: Diagnosis present

## 2013-08-15 DIAGNOSIS — IMO0002 Reserved for concepts with insufficient information to code with codable children: Secondary | ICD-10-CM | POA: Diagnosis not present

## 2013-08-15 DIAGNOSIS — J9600 Acute respiratory failure, unspecified whether with hypoxia or hypercapnia: Secondary | ICD-10-CM | POA: Diagnosis not present

## 2013-08-15 DIAGNOSIS — S6990XA Unspecified injury of unspecified wrist, hand and finger(s), initial encounter: Secondary | ICD-10-CM | POA: Diagnosis not present

## 2013-08-15 DIAGNOSIS — E871 Hypo-osmolality and hyponatremia: Secondary | ICD-10-CM | POA: Diagnosis not present

## 2013-08-15 DIAGNOSIS — S31109A Unspecified open wound of abdominal wall, unspecified quadrant without penetration into peritoneal cavity, initial encounter: Secondary | ICD-10-CM | POA: Diagnosis not present

## 2013-08-15 DIAGNOSIS — T07XXXA Unspecified multiple injuries, initial encounter: Secondary | ICD-10-CM

## 2013-08-15 DIAGNOSIS — T148XXA Other injury of unspecified body region, initial encounter: Secondary | ICD-10-CM | POA: Diagnosis not present

## 2013-08-15 DIAGNOSIS — J95851 Ventilator associated pneumonia: Secondary | ICD-10-CM | POA: Diagnosis not present

## 2013-08-15 DIAGNOSIS — S066XAA Traumatic subarachnoid hemorrhage with loss of consciousness status unknown, initial encounter: Secondary | ICD-10-CM | POA: Diagnosis not present

## 2013-08-15 DIAGNOSIS — S0190XA Unspecified open wound of unspecified part of head, initial encounter: Secondary | ICD-10-CM | POA: Diagnosis not present

## 2013-08-15 DIAGNOSIS — I10 Essential (primary) hypertension: Secondary | ICD-10-CM | POA: Diagnosis present

## 2013-08-15 DIAGNOSIS — S59909A Unspecified injury of unspecified elbow, initial encounter: Secondary | ICD-10-CM | POA: Diagnosis not present

## 2013-08-15 DIAGNOSIS — S1190XA Unspecified open wound of unspecified part of neck, initial encounter: Secondary | ICD-10-CM | POA: Diagnosis present

## 2013-08-15 DIAGNOSIS — S0183XA Puncture wound without foreign body of other part of head, initial encounter: Secondary | ICD-10-CM

## 2013-08-15 DIAGNOSIS — R918 Other nonspecific abnormal finding of lung field: Secondary | ICD-10-CM | POA: Diagnosis not present

## 2013-08-15 DIAGNOSIS — S62306A Unspecified fracture of fifth metacarpal bone, right hand, initial encounter for closed fracture: Secondary | ICD-10-CM | POA: Diagnosis present

## 2013-08-15 DIAGNOSIS — I82409 Acute embolism and thrombosis of unspecified deep veins of unspecified lower extremity: Secondary | ICD-10-CM | POA: Diagnosis not present

## 2013-08-15 DIAGNOSIS — S61409A Unspecified open wound of unspecified hand, initial encounter: Secondary | ICD-10-CM | POA: Diagnosis not present

## 2013-08-15 DIAGNOSIS — S61439A Puncture wound without foreign body of unspecified hand, initial encounter: Secondary | ICD-10-CM

## 2013-08-15 DIAGNOSIS — S0193XA Puncture wound without foreign body of unspecified part of head, initial encounter: Secondary | ICD-10-CM

## 2013-08-15 DIAGNOSIS — S0003XA Contusion of scalp, initial encounter: Secondary | ICD-10-CM | POA: Diagnosis not present

## 2013-08-15 DIAGNOSIS — Z4682 Encounter for fitting and adjustment of non-vascular catheter: Secondary | ICD-10-CM | POA: Diagnosis not present

## 2013-08-15 HISTORY — PX: ESOPHAGOGASTRODUODENOSCOPY: SHX5428

## 2013-08-15 LAB — I-STAT ARTERIAL BLOOD GAS, ED
Acid-Base Excess: 3 mmol/L — ABNORMAL HIGH (ref 0.0–2.0)
Bicarbonate: 27.1 mEq/L — ABNORMAL HIGH (ref 20.0–24.0)
O2 Saturation: 100 %
TCO2: 28 mmol/L (ref 0–100)
pCO2 arterial: 37.3 mmHg (ref 35.0–45.0)
pH, Arterial: 7.47 — ABNORMAL HIGH (ref 7.350–7.450)
pO2, Arterial: 240 mmHg — ABNORMAL HIGH (ref 80.0–100.0)

## 2013-08-15 LAB — URINE MICROSCOPIC-ADD ON

## 2013-08-15 LAB — CBC
HCT: 43 % (ref 39.0–52.0)
Hemoglobin: 14.4 g/dL (ref 13.0–17.0)
MCH: 31.4 pg (ref 26.0–34.0)
MCHC: 33.5 g/dL (ref 30.0–36.0)
MCV: 93.9 fL (ref 78.0–100.0)
Platelets: 194 10*3/uL (ref 150–400)
RBC: 4.58 MIL/uL (ref 4.22–5.81)
RDW: 13.2 % (ref 11.5–15.5)
WBC: 12.1 10*3/uL — ABNORMAL HIGH (ref 4.0–10.5)

## 2013-08-15 LAB — I-STAT CHEM 8, ED
BUN: 19 mg/dL (ref 6–23)
Calcium, Ion: 1.13 mmol/L (ref 1.13–1.30)
Chloride: 101 mEq/L (ref 96–112)
Creatinine, Ser: 1.5 mg/dL — ABNORMAL HIGH (ref 0.50–1.35)
Glucose, Bld: 169 mg/dL — ABNORMAL HIGH (ref 70–99)
HCT: 44 % (ref 39.0–52.0)
Hemoglobin: 15 g/dL (ref 13.0–17.0)
Potassium: 3 mEq/L — ABNORMAL LOW (ref 3.7–5.3)
Sodium: 141 mEq/L (ref 137–147)
TCO2: 23 mmol/L (ref 0–100)

## 2013-08-15 LAB — PREPARE FRESH FROZEN PLASMA
Unit division: 0
Unit division: 0

## 2013-08-15 LAB — URINALYSIS, ROUTINE W REFLEX MICROSCOPIC
Bilirubin Urine: NEGATIVE
Glucose, UA: NEGATIVE mg/dL
Ketones, ur: NEGATIVE mg/dL
Leukocytes, UA: NEGATIVE
Nitrite: NEGATIVE
Protein, ur: NEGATIVE mg/dL
Specific Gravity, Urine: 1.031 — ABNORMAL HIGH (ref 1.005–1.030)
Urobilinogen, UA: 1 mg/dL (ref 0.0–1.0)
pH: 7 (ref 5.0–8.0)

## 2013-08-15 LAB — MRSA PCR SCREENING: MRSA by PCR: NEGATIVE

## 2013-08-15 LAB — I-STAT CG4 LACTIC ACID, ED: Lactic Acid, Venous: 3.26 mmol/L — ABNORMAL HIGH (ref 0.5–2.2)

## 2013-08-15 LAB — PROTIME-INR
INR: 1.05 (ref 0.00–1.49)
Prothrombin Time: 13.7 seconds (ref 11.6–15.2)

## 2013-08-15 LAB — GLUCOSE, CAPILLARY
Glucose-Capillary: 181 mg/dL — ABNORMAL HIGH (ref 70–99)
Glucose-Capillary: 124 mg/dL — ABNORMAL HIGH (ref 70–99)

## 2013-08-15 LAB — COMPREHENSIVE METABOLIC PANEL
ALT: 16 U/L (ref 0–53)
AST: 21 U/L (ref 0–37)
Albumin: 3.7 g/dL (ref 3.5–5.2)
Alkaline Phosphatase: 53 U/L (ref 39–117)
Anion gap: 18 — ABNORMAL HIGH (ref 5–15)
BUN: 18 mg/dL (ref 6–23)
CO2: 24 mEq/L (ref 19–32)
Calcium: 9.1 mg/dL (ref 8.4–10.5)
Chloride: 100 mEq/L (ref 96–112)
Creatinine, Ser: 1.29 mg/dL (ref 0.50–1.35)
GFR calc Af Amer: 60 mL/min — ABNORMAL LOW (ref >90)
GFR calc non Af Amer: 51 mL/min — ABNORMAL LOW (ref >90)
Glucose, Bld: 155 mg/dL — ABNORMAL HIGH (ref 70–99)
Potassium: 3.2 mEq/L — ABNORMAL LOW (ref 3.7–5.3)
Sodium: 142 mEq/L (ref 137–147)
Total Bilirubin: 0.8 mg/dL (ref 0.3–1.2)
Total Protein: 7.1 g/dL (ref 6.0–8.3)

## 2013-08-15 LAB — TRIGLYCERIDES: Triglycerides: 310 mg/dL — ABNORMAL HIGH (ref <150)

## 2013-08-15 LAB — ABO/RH: ABO/RH(D): A POS

## 2013-08-15 LAB — CDS SEROLOGY

## 2013-08-15 LAB — ETHANOL: Alcohol, Ethyl (B): <11 mg/dL (ref 0–11)

## 2013-08-15 SURGERY — EGD (ESOPHAGOGASTRODUODENOSCOPY)
Anesthesia: Moderate Sedation

## 2013-08-15 MED ORDER — MIDAZOLAM HCL 2 MG/2ML IJ SOLN
INTRAMUSCULAR | Status: AC
  Filled 2013-08-15: qty 8

## 2013-08-15 MED ORDER — DIPHENHYDRAMINE HCL 50 MG/ML IJ SOLN
INTRAMUSCULAR | Status: AC
  Filled 2013-08-15: qty 1

## 2013-08-15 MED ORDER — CEFAZOLIN SODIUM-DEXTROSE 2-3 GM-% IV SOLR
2.0000 g | Freq: Once | INTRAVENOUS | Status: AC
  Administered 2013-08-15: 2 g via INTRAVENOUS
  Filled 2013-08-15: qty 50

## 2013-08-15 MED ORDER — PRO-STAT SUGAR FREE PO LIQD
30.0000 mL | Freq: Three times a day (TID) | ORAL | Status: DC
  Administered 2013-08-15: 18:00:00
  Administered 2013-08-16 – 2013-08-18 (×8): 30 mL
  Administered 2013-08-19 (×3)
  Administered 2013-08-20: 30 mL
  Filled 2013-08-15 (×17): qty 30

## 2013-08-15 MED ORDER — POTASSIUM CHLORIDE IN NACL 20-0.9 MEQ/L-% IV SOLN
INTRAVENOUS | Status: DC
  Administered 2013-08-15 – 2013-08-22 (×12): via INTRAVENOUS
  Administered 2013-08-22: 75 mL/h via INTRAVENOUS
  Administered 2013-08-23 – 2013-08-29 (×6): via INTRAVENOUS
  Administered 2013-08-31: 10 mL/h via INTRAVENOUS
  Filled 2013-08-15 (×29): qty 1000

## 2013-08-15 MED ORDER — MIDAZOLAM HCL 2 MG/2ML IJ SOLN
INTRAMUSCULAR | Status: AC
  Filled 2013-08-15: qty 6

## 2013-08-15 MED ORDER — PIVOT 1.5 CAL PO LIQD
1000.0000 mL | ORAL | Status: DC
  Administered 2013-08-16 – 2013-08-26 (×10): 1000 mL
  Filled 2013-08-15 (×15): qty 1000

## 2013-08-15 MED ORDER — BIOTENE DRY MOUTH MT LIQD
15.0000 mL | Freq: Four times a day (QID) | OROMUCOSAL | Status: DC
  Administered 2013-08-15 – 2013-08-16 (×6): 15 mL via OROMUCOSAL

## 2013-08-15 MED ORDER — ETOMIDATE 2 MG/ML IV SOLN
INTRAVENOUS | Status: DC | PRN
  Administered 2013-08-15: 20 mg via INTRAVENOUS

## 2013-08-15 MED ORDER — VITAMIN E 15 UNIT/0.3ML PO SOLN
400.0000 [IU] | Freq: Three times a day (TID) | ORAL | Status: AC
  Administered 2013-08-16 – 2013-08-22 (×18): 400 [IU]
  Filled 2013-08-15 (×21): qty 8

## 2013-08-15 MED ORDER — SUCCINYLCHOLINE CHLORIDE 20 MG/ML IJ SOLN
INTRAMUSCULAR | Status: DC | PRN
  Administered 2013-08-15: 200 mg via INTRAVENOUS

## 2013-08-15 MED ORDER — FENTANYL CITRATE 0.05 MG/ML IJ SOLN
INTRAMUSCULAR | Status: AC
  Filled 2013-08-15: qty 2

## 2013-08-15 MED ORDER — FENTANYL CITRATE 0.05 MG/ML IJ SOLN
50.0000 ug | INTRAMUSCULAR | Status: DC | PRN
  Administered 2013-08-15 – 2013-08-19 (×31): 50 ug via INTRAVENOUS
  Administered 2013-08-19: 13:00:00 via INTRAVENOUS
  Administered 2013-08-19 – 2013-08-22 (×19): 50 ug via INTRAVENOUS
  Filled 2013-08-15 (×50): qty 2

## 2013-08-15 MED ORDER — MIDAZOLAM HCL 2 MG/2ML IJ SOLN
INTRAMUSCULAR | Status: AC
  Administered 2013-08-15: 4 mg
  Filled 2013-08-15: qty 4

## 2013-08-15 MED ORDER — ONDANSETRON HCL 4 MG/2ML IJ SOLN
4.0000 mg | Freq: Four times a day (QID) | INTRAMUSCULAR | Status: DC | PRN
  Administered 2013-08-15: 4 mg via INTRAVENOUS
  Filled 2013-08-15: qty 2

## 2013-08-15 MED ORDER — PANTOPRAZOLE SODIUM 40 MG IV SOLR
40.0000 mg | Freq: Every day | INTRAVENOUS | Status: DC
  Administered 2013-08-15 – 2013-08-23 (×9): 40 mg via INTRAVENOUS
  Filled 2013-08-15 (×9): qty 40

## 2013-08-15 MED ORDER — SELENIUM 50 MCG PO TABS
200.0000 ug | ORAL_TABLET | Freq: Every day | ORAL | Status: AC
  Administered 2013-08-16 – 2013-08-21 (×6): 200 ug
  Filled 2013-08-15 (×7): qty 4

## 2013-08-15 MED ORDER — ONDANSETRON HCL 4 MG PO TABS: 4.0000 mg | ORAL_TABLET | Freq: Four times a day (QID) | ORAL | Status: DC | PRN

## 2013-08-15 MED ORDER — PIVOT 1.5 CAL PO LIQD
1000.0000 mL | ORAL | Status: DC
  Filled 2013-08-15 (×2): qty 1000

## 2013-08-15 MED ORDER — PROPOFOL 10 MG/ML IV EMUL
0.0000 ug/kg/min | INTRAVENOUS | Status: DC
  Administered 2013-08-15: 15 ug/kg/min via INTRAVENOUS
  Administered 2013-08-15: 20 ug/kg/min via INTRAVENOUS
  Administered 2013-08-16: 10 ug/kg/min via INTRAVENOUS
  Administered 2013-08-16: 15 ug/kg/min via INTRAVENOUS
  Administered 2013-08-17 (×3): 10 ug/kg/min via INTRAVENOUS
  Administered 2013-08-19 – 2013-08-20 (×6): 20 ug/kg/min via INTRAVENOUS
  Administered 2013-08-21: 10 ug/kg/min via INTRAVENOUS
  Administered 2013-08-21: 30 ug/kg/min via INTRAVENOUS
  Administered 2013-08-21: 40 ug/kg/min via INTRAVENOUS
  Administered 2013-08-22: 30 ug/kg/min via INTRAVENOUS
  Administered 2013-08-22 (×2): 20 ug/kg/min via INTRAVENOUS
  Administered 2013-08-23 – 2013-08-24 (×2): 10 ug/kg/min via INTRAVENOUS
  Administered 2013-08-24: 15 ug/kg/min via INTRAVENOUS
  Administered 2013-08-24 – 2013-08-25 (×2): 20 ug/kg/min via INTRAVENOUS
  Filled 2013-08-15 (×24): qty 100

## 2013-08-15 MED ORDER — FENTANYL CITRATE 0.05 MG/ML IJ SOLN
INTRAMUSCULAR | Status: AC
  Administered 2013-08-15: 100 ug
  Filled 2013-08-15: qty 4

## 2013-08-15 MED ORDER — MIDAZOLAM HCL 5 MG/5ML IJ SOLN
INTRAMUSCULAR | Status: AC | PRN
  Administered 2013-08-15: 4 mg via INTRAVENOUS
  Administered 2013-08-15: 5 mg via INTRAVENOUS

## 2013-08-15 MED ORDER — ADULT MULTIVITAMIN LIQUID CH
5.0000 mL | Freq: Every day | ORAL | Status: DC
  Administered 2013-08-15 – 2013-09-03 (×19): 5 mL
  Filled 2013-08-15 (×22): qty 5

## 2013-08-15 MED ORDER — FENTANYL CITRATE 0.05 MG/ML IJ SOLN
INTRAMUSCULAR | Status: AC
  Filled 2013-08-15: qty 4

## 2013-08-15 MED ORDER — CHLORHEXIDINE GLUCONATE 0.12 % MT SOLN
15.0000 mL | Freq: Two times a day (BID) | OROMUCOSAL | Status: DC
  Administered 2013-08-15 – 2013-09-03 (×38): 15 mL via OROMUCOSAL
  Filled 2013-08-15 (×37): qty 15

## 2013-08-15 MED ORDER — FENTANYL CITRATE 0.05 MG/ML IJ SOLN
INTRAMUSCULAR | Status: AC | PRN
  Administered 2013-08-15 (×2): 100 ug via INTRAVENOUS

## 2013-08-15 MED ORDER — IOHEXOL 350 MG/ML SOLN: 100.0000 mL | Freq: Once | INTRAVENOUS | Status: AC | PRN

## 2013-08-15 MED ORDER — CEFAZOLIN SODIUM 1-5 GM-% IV SOLN
1.0000 g | Freq: Three times a day (TID) | INTRAVENOUS | Status: DC
  Administered 2013-08-15 – 2013-08-21 (×17): 1 g via INTRAVENOUS
  Filled 2013-08-15 (×18): qty 50

## 2013-08-15 MED ORDER — CETYLPYRIDINIUM CHLORIDE 0.05 % MT LIQD: 7.0000 mL | Freq: Four times a day (QID) | OROMUCOSAL | Status: DC

## 2013-08-15 MED ORDER — MIDAZOLAM HCL 5 MG/ML IJ SOLN
INTRAMUSCULAR | Status: AC
  Filled 2013-08-15: qty 4

## 2013-08-15 MED ORDER — VECURONIUM BROMIDE 10 MG IV SOLR
INTRAVENOUS | Status: AC
  Administered 2013-08-15: 10 mg
  Filled 2013-08-15: qty 10

## 2013-08-15 MED ORDER — PROPOFOL 10 MG/ML IV EMUL
5.0000 ug/kg/min | Freq: Once | INTRAVENOUS | Status: DC
  Administered 2013-08-15: 10 ug/kg/min via INTRAVENOUS

## 2013-08-15 MED ORDER — PANTOPRAZOLE SODIUM 40 MG PO TBEC: 40.0000 mg | DELAYED_RELEASE_TABLET | Freq: Every day | ORAL | Status: DC

## 2013-08-15 MED ORDER — FENTANYL CITRATE 0.05 MG/ML IJ SOLN
INTRAMUSCULAR | Status: AC
  Administered 2013-08-15: 50 ug
  Filled 2013-08-15: qty 4

## 2013-08-15 MED ORDER — VITAMIN C 500 MG PO TABS
1000.0000 mg | ORAL_TABLET | Freq: Three times a day (TID) | ORAL | Status: AC
  Administered 2013-08-16 – 2013-08-22 (×18): 1000 mg
  Filled 2013-08-15 (×21): qty 2

## 2013-08-15 NOTE — ED Notes (Signed)
Blankets/ Warming measures implemented
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
## 2013-08-15 NOTE — ED Notes (Signed)
Dr. Hulen Skains declared pelvic is stable and atraumatic
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
## 2013-08-15 NOTE — ED Notes (Signed)
ET tube placement confirmed by x-ray

## 2013-08-15 NOTE — ED Notes (Signed)
Portable chest xray confirm tube placement

## 2013-08-15 NOTE — ED Notes (Signed)
Patient returned to room escorted by Mali Grose, RN; [place], [place]; [place], [place]

## 2013-08-15 NOTE — Progress Notes (Signed)
Emergent bedside egd performed by dr [name] wyatt.  Verbal consent for procedure was given to dr wyatt by spouse of patient.  Patient sedated by icu nurse.
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
## 2013-08-15 NOTE — ED Notes (Signed)
Patient transported upstairs.  

## 2013-08-15 NOTE — ED Notes (Signed)
Dr. Christy Gentles intubating patient, 1 attempt. 7.5 ET tube secured 23 at lip, preoxigenated
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
## 2013-08-15 NOTE — ED Notes (Signed)
Trauma Start 
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
## 2013-08-15 NOTE — Progress Notes (Signed)
Dr Hulen Skains at bedside preforming EGD. Dr. Kathyrn Sheriff nofified of pts arrival to floor.
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
## 2013-08-15 NOTE — Progress Notes (Signed)
Austin [name] of [place] accompanied by Austin [name] to room. Update given to Lt. Mindi Junker

## 2013-08-15 NOTE — ED Notes (Signed)
Portable xray arrived and at bedside
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
## 2013-08-15 NOTE — ED Notes (Addendum)
8mg  Versed and 266mcg Fentanyl override pull to bedside per verbal order Hulen Skains MD

## 2013-08-15 NOTE — ED Notes (Signed)
Portable xray at bedside.
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
## 2013-08-15 NOTE — ED Notes (Signed)
Pt transported to CT accompanied by [place] gross; [place], RT; Dr. Hulen Skains and [place] PA
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
## 2013-08-16 ENCOUNTER — Inpatient Hospital Stay (HOSPITAL_COMMUNITY): Payer: Medicare Other

## 2013-08-16 DIAGNOSIS — S0633AA Contusion and laceration of cerebrum, unspecified, with loss of consciousness status unknown, initial encounter: Secondary | ICD-10-CM

## 2013-08-16 DIAGNOSIS — S0190XA Unspecified open wound of unspecified part of head, initial encounter: Secondary | ICD-10-CM

## 2013-08-16 DIAGNOSIS — J9600 Acute respiratory failure, unspecified whether with hypoxia or hypercapnia: Secondary | ICD-10-CM

## 2013-08-16 DIAGNOSIS — S06339A Contusion and laceration of cerebrum, unspecified, with loss of consciousness of unspecified duration, initial encounter: Secondary | ICD-10-CM

## 2013-08-16 LAB — GLUCOSE, CAPILLARY
Glucose-Capillary: 119 mg/dL — ABNORMAL HIGH (ref 70–99)
Glucose-Capillary: 151 mg/dL — ABNORMAL HIGH (ref 70–99)
Glucose-Capillary: 153 mg/dL — ABNORMAL HIGH (ref 70–99)
Glucose-Capillary: 153 mg/dL — ABNORMAL HIGH (ref 70–99)
Glucose-Capillary: 154 mg/dL — ABNORMAL HIGH (ref 70–99)
Glucose-Capillary: 157 mg/dL — ABNORMAL HIGH (ref 70–99)

## 2013-08-16 LAB — CBC
HCT: 38.8 % — ABNORMAL LOW (ref 39.0–52.0)
Hemoglobin: 12.9 g/dL — ABNORMAL LOW (ref 13.0–17.0)
MCH: 30.8 pg (ref 26.0–34.0)
MCHC: 33.2 g/dL (ref 30.0–36.0)
MCV: 92.6 fL (ref 78.0–100.0)
Platelets: 177 10*3/uL (ref 150–400)
RBC: 4.19 MIL/uL — ABNORMAL LOW (ref 4.22–5.81)
RDW: 13.6 % (ref 11.5–15.5)
WBC: 15 10*3/uL — ABNORMAL HIGH (ref 4.0–10.5)

## 2013-08-16 LAB — BLOOD GAS, ARTERIAL
Acid-Base Excess: 1.9 mmol/L (ref 0.0–2.0)
Bicarbonate: 25.6 mEq/L — ABNORMAL HIGH (ref 20.0–24.0)
Drawn by: 28098
FIO2: 0.3 %
Mode: POSITIVE
O2 Saturation: 96.6 %
PEEP: 5 cmH2O
Patient temperature: 98.6
Pressure support: 5 cmH2O
TCO2: 26.7 mmol/L (ref 0–100)
pCO2 arterial: 37.3 mmHg (ref 35.0–45.0)
pH, Arterial: 7.45 (ref 7.350–7.450)
pO2, Arterial: 87.8 mmHg (ref 80.0–100.0)

## 2013-08-16 LAB — BASIC METABOLIC PANEL
Anion gap: 16 — ABNORMAL HIGH (ref 5–15)
BUN: 21 mg/dL (ref 6–23)
CO2: 23 mEq/L (ref 19–32)
Calcium: 8.5 mg/dL (ref 8.4–10.5)
Chloride: 102 mEq/L (ref 96–112)
Creatinine, Ser: 1.45 mg/dL — ABNORMAL HIGH (ref 0.50–1.35)
GFR calc Af Amer: 52 mL/min — ABNORMAL LOW (ref >90)
GFR calc non Af Amer: 45 mL/min — ABNORMAL LOW (ref >90)
Glucose, Bld: 151 mg/dL — ABNORMAL HIGH (ref 70–99)
Potassium: 3.9 mEq/L (ref 3.7–5.3)
Sodium: 141 mEq/L (ref 137–147)

## 2013-08-16 MED ORDER — LEVETIRACETAM IN NACL 500 MG/100ML IV SOLN
500.0000 mg | Freq: Two times a day (BID) | INTRAVENOUS | Status: AC
  Administered 2013-08-16 – 2013-08-22 (×14): 500 mg via INTRAVENOUS
  Filled 2013-08-16 (×14): qty 100

## 2013-08-16 MED ORDER — BIOTENE DRY MOUTH MT LIQD
15.0000 mL | Freq: Four times a day (QID) | OROMUCOSAL | Status: DC
  Administered 2013-08-17 – 2013-08-21 (×18): 15 mL via OROMUCOSAL
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
## 2013-08-16 NOTE — Progress Notes (Signed)
Will hold off on extubating at this time per Dr. Hulen Skains, increased PS to 8 for pt comfort, pt tolerating well, RT will continue to monitor

## 2013-08-16 NOTE — Progress Notes (Signed)
Pt placed on C/PS 5/5 at this time per Dr. Hulen Skains, pt tolerating well, goal is to continue 5/5 as tolerated at this time

## 2013-08-16 NOTE — Progress Notes (Signed)
Follow up - Trauma and Critical Care  Patient Details:    Austin [name] is an 80 y.o. male.  Lines/tubes : Airway 7.5 mm (Active)  Secured at (cm) 23 cm 08/16/2013  9:58 AM  Measured From Lips 08/16/2013  9:58 AM  Secured Location Right 08/16/2013  9:58 AM  Secured By [employer] 08/16/2013  9:58 AM  Tube Holder Repositioned Yes 08/16/2013  9:58 AM  Cuff Pressure (cm H2O) 30 cm H2O 08/15/2013  3:10 PM  Site Condition Dry 08/15/2013  8:00 PM     NG/OG Tube Orogastric 16 Fr. (Active)  Placement Verification Xray;Auscultation 08/16/2013  8:00 AM  Site Assessment Clean;Dry;Intact 08/16/2013  8:00 AM  Status Suction-low intermittent 08/16/2013  8:00 AM  Drainage Appearance Clear;Brown;Yellow 08/16/2013  8:00 AM  Output (mL) 400 mL 08/16/2013  6:00 AM     Urethral Catheter Sharita, NT Temperature probe 16 Fr. (Active)  Indication for Insertion or Continuance of Catheter Unstable critical patients (first 24-48 hours);Unstable spinal/crush injuries 08/16/2013  8:00 AM  Site Assessment Clean;Intact 08/16/2013  8:00 AM  Catheter Maintenance Bag below level of bladder;Catheter secured;Drainage bag/tubing not touching floor;Insertion date on drainage bag;No dependent loops;Seal intact 08/16/2013  8:00 AM  Collection Container Standard drainage bag 08/16/2013  8:00 AM  Securement Method Leg strap 08/16/2013  8:00 AM  Urinary Catheter Interventions Unclamped 08/16/2013  8:00 AM  Output (mL) 60 mL 08/16/2013  6:00 AM     ICP/Ventriculostomy Ventricular drainage catheter with ICP monitoring Right (Active)  Drain Status Open 08/16/2013  8:00 AM  Level Other (Comment) 08/16/2013  8:00 AM  Status Open to continuous drainage 08/16/2013  8:00 AM  CSF Color Serosanguineous 08/16/2013  8:00 AM  Site Assessment Clean;Dry 08/16/2013  8:00 AM  Dressing Status Clean;Dry;Intact 08/16/2013  8:00 AM  Output (mL) 6 mL 08/16/2013  9:00 AM    Microbiology/Sepsis markers: Results for orders placed during the  hospital encounter of 08/15/13  MRSA PCR SCREENING     Status: None   Collection Time    08/15/13 11:58 AM      Result Value Ref Range Status   MRSA by PCR NEGATIVE  NEGATIVE Final   Comment:            The GeneXpert MRSA Assay (FDA     approved for NASAL specimens     only), is one component of a     comprehensive MRSA colonization     surveillance program. It is not     intended to diagnose MRSA     infection nor to guide or     monitor treatment for     MRSA infections.    Anti-infectives:  Anti-infectives   Start     Dose/Rate Route Frequency Ordered Stop   08/15/13 2200  ceFAZolin (ANCEF) IVPB 1 g/50 mL premix     1 g 100 mL/hr over 30 Minutes Intravenous 3 times per day 08/15/13 1202     08/15/13 1300  ceFAZolin (ANCEF) IVPB 2 g/50 mL premix     2 g 100 mL/hr over 30 Minutes Intravenous  Once 08/15/13 1202 08/15/13 1449      Best Practice/Protocols:  VTE Prophylaxis: Mechanical GI Prophylaxis: Proton Pump Inhibitor Continous Sedation  Consults: Treatment Team:  Consuella Lose, MD Rosetta Posner, MD    Events:  Subjective:    Overnight Issues: Sedation has been held off.  Now being weaned on the ventilator.  No distress, but not following commands for me now but  did overnight.  Moves the right side well.  Objective:  Vital signs for last 24 hours: Temp:  [96.8 F (36 C)-100.9 F (38.3 C)] 99.5 F (37.5 C) (07/25 0800) Pulse Rate:  [47-109] 100 (07/25 0958) Resp:  [15-25] 16 (07/25 0958) BP: (89-157)/(53-85) 125/77 mmHg (07/25 0958) SpO2:  [99 %-100 %] 100 % (07/25 0958) FiO2 (%):  [30 %-50 %] 30 % (07/25 0958) Weight:  [83 kg (182 lb 15.7 oz)] 83 kg (182 lb 15.7 oz) (07/25 0500)  Hemodynamic parameters for last 24 hours:    Intake/Output from previous day: 07/24 0701 - 07/25 0700 In: 2482.5 [I.V.:2382.5; IV Piggyback:100] Out: 2125 [Urine:1395; Emesis/NG output:653; Drains:77]  Intake/Output this shift: Total I/O In: 161 [I.V.:161] Out: 9  [Drains:9]  Vent settings for last 24 hours: Vent Mode:  [-] PRVC FiO2 (%):  [30 %-50 %] 30 % Set Rate:  [16 bmp] 16 bmp Vt Set:  [600 mL] 600 mL PEEP:  [5 cmH20] 5 cmH20 Plateau Pressure:  [17 cmH20-19 cmH20] 17 cmH20  Physical Exam:  General: no respiratory distress Neuro: RASS 0, RASS -1, weakness left upper extremity and weakness left lower extremity Resp: clear to auscultation bilaterally CVS: regular rate and rhythm, S1, S2 normal, no murmur, click, rub or gallop Extremities: no edema, no erythema, pulses WNL and has SCDs in place  Results for orders placed during the hospital encounter of 08/15/13 (from the past 24 hour(s))  URINALYSIS, ROUTINE W REFLEX MICROSCOPIC     Status: Abnormal   Collection Time    08/15/13 11:42 AM      Result Value Ref Range   Color, Urine YELLOW  YELLOW   APPearance CLEAR  CLEAR   Specific Gravity, Urine 1.031 (*) 1.005 - 1.030   pH 7.0  5.0 - 8.0   Glucose, UA NEGATIVE  NEGATIVE mg/dL   Hgb urine dipstick MODERATE (*) NEGATIVE   Bilirubin Urine NEGATIVE  NEGATIVE   Ketones, ur NEGATIVE  NEGATIVE mg/dL   Protein, ur NEGATIVE  NEGATIVE mg/dL   Urobilinogen, UA 1.0  0.0 - 1.0 mg/dL   Nitrite NEGATIVE  NEGATIVE   Leukocytes, UA NEGATIVE  NEGATIVE  URINE MICROSCOPIC-ADD ON     Status: None   Collection Time    08/15/13 11:42 AM      Result Value Ref Range   RBC / HPF 7-10  <3 RBC/hpf  MRSA PCR SCREENING     Status: None   Collection Time    08/15/13 11:58 AM      Result Value Ref Range   MRSA by PCR NEGATIVE  NEGATIVE  TRIGLYCERIDES     Status: Abnormal   Collection Time    08/15/13  2:53 PM      Result Value Ref Range   Triglycerides 310 (*) <150 mg/dL  GLUCOSE, CAPILLARY     Status: Abnormal   Collection Time    08/15/13  3:56 PM      Result Value Ref Range   Glucose-Capillary 181 (*) 70 - 99 mg/dL   Comment 1 Notify RN     Comment 2 Documented in Chart    GLUCOSE, CAPILLARY     Status: Abnormal   Collection Time    08/15/13   8:20 PM      Result Value Ref Range   Glucose-Capillary 124 (*) 70 - 99 mg/dL   Comment 1 Notify RN     Comment 2 Documented in Chart    GLUCOSE, CAPILLARY     Status: Abnormal  Collection Time    08/15/13 11:54 PM      Result Value Ref Range   Glucose-Capillary 151 (*) 70 - 99 mg/dL   Comment 1 Documented in Chart     Comment 2 Notify RN    CBC     Status: Abnormal   Collection Time    08/16/13  2:32 AM      Result Value Ref Range   WBC 15.0 (*) 4.0 - 10.5 K/uL   RBC 4.19 (*) 4.22 - 5.81 MIL/uL   Hemoglobin 12.9 (*) 13.0 - 17.0 g/dL   HCT 38.8 (*) 39.0 - 52.0 %   MCV 92.6  78.0 - 100.0 fL   MCH 30.8  26.0 - 34.0 pg   MCHC 33.2  30.0 - 36.0 g/dL   RDW 13.6  11.5 - 15.5 %   Platelets 177  150 - 400 K/uL  BASIC METABOLIC PANEL     Status: Abnormal   Collection Time    08/16/13  2:32 AM      Result Value Ref Range   Sodium 141  137 - 147 mEq/L   Potassium 3.9  3.7 - 5.3 mEq/L   Chloride 102  96 - 112 mEq/L   CO2 23  19 - 32 mEq/L   Glucose, Bld 151 (*) 70 - 99 mg/dL   BUN 21  6 - 23 mg/dL   Creatinine, Ser 1.45 (*) 0.50 - 1.35 mg/dL   Calcium 8.5  8.4 - 10.5 mg/dL   GFR calc non Af Amer 45 (*) >90 mL/min   GFR calc Af Amer 52 (*) >90 mL/min   Anion gap 16 (*) 5 - 15  GLUCOSE, CAPILLARY     Status: Abnormal   Collection Time    08/16/13  3:13 AM      Result Value Ref Range   Glucose-Capillary 153 (*) 70 - 99 mg/dL   Comment 1 Documented in Chart     Comment 2 Notify RN    GLUCOSE, CAPILLARY     Status: Abnormal   Collection Time    08/16/13  7:49 AM      Result Value Ref Range   Glucose-Capillary 154 (*) 70 - 99 mg/dL     Assessment/Plan:   NEURO  Altered Mental Status:  sedation   Plan: Wean sedation as we continue to try to wean from the ventilator.  PULM  No specific issues.   Plan: CPM with weaning  CARDIO  No specific issues   Plan: CPM  RENAL  Urine output has been good.   Plan: CPM  GI  No issues   Plan: Start tube feedings if not extubated.   ID  No known infectious sources.   Plan: CPM.  On Kefzol for open fractures  HEME  Anemia acute blood loss anemia)   Plan: Very mild and does not require transfusion  ENDO no known problems   Plan: CPM  Global Issues  Amazingly this patient is doing well enough that he could be extubated.  ICPs have been good.  Weaning well.  If not extubated will start tube feeding.    LOS: 1 day   Additional comments:I reviewed the patient's new clinical lab test results. cbc/bmet and I reviewed the patients new imaging test results. cxr  Critical Care Total Time*: 30 Minutes  Calypso Hagarty, Jaqualin O 08/16/2013  *Care during the described time interval was provided by me and/or other providers on the critical care team.  I have reviewed this patient's available  data, including medical history, events of note, physical examination and test results as part of my evaluation.

## 2013-08-16 NOTE — Progress Notes (Signed)
Pt remains on full support at this time tolerating well, pt not breathing over set rate and remains on sedation, will re-eval for wean later as tolerated, RT will monitor

## 2013-08-17 ENCOUNTER — Inpatient Hospital Stay (HOSPITAL_COMMUNITY): Payer: Medicare Other

## 2013-08-17 ENCOUNTER — Encounter (HOSPITAL_COMMUNITY): Payer: Self-pay | Admitting: Neurology

## 2013-08-17 DIAGNOSIS — S0180XA Unspecified open wound of other part of head, initial encounter: Secondary | ICD-10-CM

## 2013-08-17 LAB — CBC WITH DIFFERENTIAL/PLATELET
Basophils Absolute: 0 10*3/uL (ref 0.0–0.1)
Basophils Absolute: 0 10*3/uL (ref 0.0–0.1)
Basophils Relative: 0 % (ref 0–1)
Basophils Relative: 0 % (ref 0–1)
Eosinophils Absolute: 0 10*3/uL (ref 0.0–0.7)
Eosinophils Absolute: 0 10*3/uL (ref 0.0–0.7)
Eosinophils Relative: 0 % (ref 0–5)
Eosinophils Relative: 0 % (ref 0–5)
HCT: 28.6 % — ABNORMAL LOW (ref 39.0–52.0)
HCT: 32.8 % — ABNORMAL LOW (ref 39.0–52.0)
Hemoglobin: 9.6 g/dL — ABNORMAL LOW (ref 13.0–17.0)
Hemoglobin: 11 g/dL — ABNORMAL LOW (ref 13.0–17.0)
Lymphocytes Relative: 9 % — ABNORMAL LOW (ref 12–46)
Lymphocytes Relative: 5 % — ABNORMAL LOW (ref 12–46)
Lymphs Abs: 1.1 10*3/uL (ref 0.7–4.0)
Lymphs Abs: 0.7 10*3/uL (ref 0.7–4.0)
MCH: 32 pg (ref 26.0–34.0)
MCH: 31.9 pg (ref 26.0–34.0)
MCHC: 33.6 g/dL (ref 30.0–36.0)
MCHC: 33.5 g/dL (ref 30.0–36.0)
MCV: 95.3 fL (ref 78.0–100.0)
MCV: 95.1 fL (ref 78.0–100.0)
Monocytes Absolute: 1.4 10*3/uL — ABNORMAL HIGH (ref 0.1–1.0)
Monocytes Absolute: 1.3 10*3/uL — ABNORMAL HIGH (ref 0.1–1.0)
Monocytes Relative: 11 % (ref 3–12)
Monocytes Relative: 10 % (ref 3–12)
Neutro Abs: 10.4 10*3/uL — ABNORMAL HIGH (ref 1.7–7.7)
Neutro Abs: 10.9 10*3/uL — ABNORMAL HIGH (ref 1.7–7.7)
Neutrophils Relative %: 81 % — ABNORMAL HIGH (ref 43–77)
Neutrophils Relative %: 85 % — ABNORMAL HIGH (ref 43–77)
Platelets: 121 10*3/uL — ABNORMAL LOW (ref 150–400)
Platelets: 138 10*3/uL — ABNORMAL LOW (ref 150–400)
RBC: 3 MIL/uL — ABNORMAL LOW (ref 4.22–5.81)
RBC: 3.45 MIL/uL — ABNORMAL LOW (ref 4.22–5.81)
RDW: 13.9 % (ref 11.5–15.5)
RDW: 13.9 % (ref 11.5–15.5)
WBC: 12.8 10*3/uL — ABNORMAL HIGH (ref 4.0–10.5)
WBC: 12.9 10*3/uL — ABNORMAL HIGH (ref 4.0–10.5)

## 2013-08-17 LAB — BASIC METABOLIC PANEL
Anion gap: 11 (ref 5–15)
BUN: 24 mg/dL — ABNORMAL HIGH (ref 6–23)
CO2: 24 meq/L (ref 19–32)
Calcium: 8.3 mg/dL — ABNORMAL LOW (ref 8.4–10.5)
Chloride: 105 meq/L (ref 96–112)
Creatinine, Ser: 1.35 mg/dL (ref 0.50–1.35)
GFR calc Af Amer: 56 mL/min — ABNORMAL LOW (ref >90)
GFR calc non Af Amer: 49 mL/min — ABNORMAL LOW (ref >90)
Glucose, Bld: 137 mg/dL — ABNORMAL HIGH (ref 70–99)
Potassium: 3.9 meq/L (ref 3.7–5.3)
Sodium: 140 mEq/L (ref 137–147)

## 2013-08-17 LAB — GLUCOSE, CAPILLARY
Glucose-Capillary: 137 mg/dL — ABNORMAL HIGH (ref 70–99)
Glucose-Capillary: 142 mg/dL — ABNORMAL HIGH (ref 70–99)
Glucose-Capillary: 141 mg/dL — ABNORMAL HIGH (ref 70–99)
Glucose-Capillary: 126 mg/dL — ABNORMAL HIGH (ref 70–99)
Glucose-Capillary: 136 mg/dL — ABNORMAL HIGH (ref 70–99)
Glucose-Capillary: 147 mg/dL — ABNORMAL HIGH (ref 70–99)
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
## 2013-08-18 ENCOUNTER — Encounter (HOSPITAL_COMMUNITY): Payer: Self-pay | Admitting: *Deleted

## 2013-08-18 LAB — TYPE AND SCREEN
ABO/RH(D): A POS
Antibody Screen: NEGATIVE
Unit division: 0
Unit division: 0

## 2013-08-18 LAB — GLUCOSE, CAPILLARY
Glucose-Capillary: 125 mg/dL — ABNORMAL HIGH (ref 70–99)
Glucose-Capillary: 129 mg/dL — ABNORMAL HIGH (ref 70–99)
Glucose-Capillary: 136 mg/dL — ABNORMAL HIGH (ref 70–99)
Glucose-Capillary: 140 mg/dL — ABNORMAL HIGH (ref 70–99)
Glucose-Capillary: 146 mg/dL — ABNORMAL HIGH (ref 70–99)
Glucose-Capillary: 147 mg/dL — ABNORMAL HIGH (ref 70–99)
Glucose-Capillary: 148 mg/dL — ABNORMAL HIGH (ref 70–99)

## 2013-08-18 LAB — TRIGLYCERIDES: Triglycerides: 184 mg/dL — ABNORMAL HIGH (ref <150)

## 2013-08-18 NOTE — Progress Notes (Signed)
UR completed.  Alcie Runions, RN BSN M[facility] CCM Trauma/Neuro ICU Case Manager [phone]  
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
## 2013-08-18 NOTE — Clinical Social Work Note (Signed)
Clinical Social Worker continuing to follow patient and family for support.  Patient remains intubated with wife and granddaughter at bedside.  Patient wife states that patient family is doing well under the circumstances.  Patient wife expressed appreciation for hospital staff support.  Patient is following commands on the right side and patient wife hopeful for patient to come off the ventilator in the near future.  CSW remains available for support to patient and patient family as needed throughout hospitalization.  Barbette Or, [place]

## 2013-08-18 NOTE — Progress Notes (Signed)
Chaplain Note: Chaplain met with patient and patient's family. Wife was present in waiting area, along with patient's granddaughter at bedside. Wife reported that patient was awake and responding to verbal communication by moving one arm. Upon entering the room, patient was resting with eyes closed, while moving both legs and only one of his arms. While I continued speaking with patient's granddaughter, patient would occasionally open his eyes and look at both of us--primarily the person in his line of sight. Patient seemed very receptive to visitors, as he gestured in agreement to have another visit from the [place]. Granddaughter states that she spends the most time with patient, throughout the day and overnight. Granddaughter seems to need support as well as she is both holding a full time position and spending the majority of time with the patient. Granddaughter seems to be the primary liaison between family and HCP's as she was employed at [employer] will continue to follow-up with patient and family, as needed.  Sheryn Bison, Chaplain
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
## 2013-08-19 LAB — GLUCOSE, CAPILLARY
Glucose-Capillary: 127 mg/dL — ABNORMAL HIGH (ref 70–99)
Glucose-Capillary: 130 mg/dL — ABNORMAL HIGH (ref 70–99)
Glucose-Capillary: 121 mg/dL — ABNORMAL HIGH (ref 70–99)
Glucose-Capillary: 115 mg/dL — ABNORMAL HIGH (ref 70–99)
Glucose-Capillary: 117 mg/dL — ABNORMAL HIGH (ref 70–99)

## 2013-08-19 MED ORDER — METOPROLOL TARTRATE 25 MG/10 ML ORAL SUSPENSION
12.5000 mg | Freq: Two times a day (BID) | ORAL | Status: DC
  Administered 2013-08-19 – 2013-08-25 (×13): 12.5 mg
  Filled 2013-08-19 (×14): qty 5

## 2013-08-19 NOTE — Progress Notes (Signed)
Rt UE splint appears too tight around thumb. Dressing repositioned. Extremity elevated. Area still with indentation, good cap refill. Will continue to watch.
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
## 2013-08-19 NOTE — Progress Notes (Addendum)
Patient ID: Austin [name], male   DOB: 1932-09-26, 80 y.o.   MRN: [id number] I spoke with his granddaughter at the bedside and his mother by phone. I updated them on his condition and answered their questions about the plan of care.  Georganna Skeans, MD, MPH, FACS Trauma: [phone] General Surgery: [phone]  08/19/2013 10:12 AM

## 2013-08-20 ENCOUNTER — Inpatient Hospital Stay (HOSPITAL_COMMUNITY): Payer: Medicare Other

## 2013-08-20 LAB — CBC
HCT: 29 % — ABNORMAL LOW (ref 39.0–52.0)
Hemoglobin: 9.8 g/dL — ABNORMAL LOW (ref 13.0–17.0)
MCH: 32 pg (ref 26.0–34.0)
MCHC: 33.8 g/dL (ref 30.0–36.0)
MCV: 94.8 fL (ref 78.0–100.0)
Platelets: 167 10*3/uL (ref 150–400)
RBC: 3.06 MIL/uL — ABNORMAL LOW (ref 4.22–5.81)
RDW: 13.2 % (ref 11.5–15.5)
WBC: 9.4 10*3/uL (ref 4.0–10.5)

## 2013-08-20 LAB — GLUCOSE, CAPILLARY
Glucose-Capillary: 124 mg/dL — ABNORMAL HIGH (ref 70–99)
Glucose-Capillary: 125 mg/dL — ABNORMAL HIGH (ref 70–99)
Glucose-Capillary: 130 mg/dL — ABNORMAL HIGH (ref 70–99)
Glucose-Capillary: 134 mg/dL — ABNORMAL HIGH (ref 70–99)
Glucose-Capillary: 150 mg/dL — ABNORMAL HIGH (ref 70–99)
Glucose-Capillary: 152 mg/dL — ABNORMAL HIGH (ref 70–99)
Glucose-Capillary: 156 mg/dL — ABNORMAL HIGH (ref 70–99)

## 2013-08-20 LAB — BASIC METABOLIC PANEL
Anion gap: 15 (ref 5–15)
BUN: 27 mg/dL — ABNORMAL HIGH (ref 6–23)
CO2: 23 mEq/L (ref 19–32)
Calcium: 8.9 mg/dL (ref 8.4–10.5)
Chloride: 101 mEq/L (ref 96–112)
Creatinine, Ser: 1.01 mg/dL (ref 0.50–1.35)
GFR calc Af Amer: 80 mL/min — ABNORMAL LOW (ref >90)
GFR calc non Af Amer: 69 mL/min — ABNORMAL LOW (ref >90)
Glucose, Bld: 143 mg/dL — ABNORMAL HIGH (ref 70–99)
Potassium: 4 mEq/L (ref 3.7–5.3)
Sodium: 139 mEq/L (ref 137–147)

## 2013-08-20 MED ORDER — CHLORHEXIDINE GLUCONATE 0.12 % MT SOLN: 15.0000 mL | Freq: Two times a day (BID) | OROMUCOSAL | Status: DC

## 2013-08-20 MED ORDER — PRO-STAT SUGAR FREE PO LIQD
60.0000 mL | Freq: Two times a day (BID) | ORAL | Status: DC
Start: 2013-08-20 — End: 2013-08-27
  Administered 2013-08-20 – 2013-08-27 (×12): 60 mL
  Filled 2013-08-20 (×16): qty 60

## 2013-08-20 MED ORDER — ACETAMINOPHEN 160 MG/5ML PO SOLN
650.0000 mg | Freq: Four times a day (QID) | ORAL | Status: DC | PRN
  Administered 2013-08-20 – 2013-09-03 (×15): 650 mg via ORAL
  Filled 2013-08-20 (×15): qty 20.3

## 2013-08-20 MED ORDER — CETYLPYRIDINIUM CHLORIDE 0.05 % MT LIQD
7.0000 mL | Freq: Four times a day (QID) | OROMUCOSAL | Status: DC
  Administered 2013-08-20: 7 mL via OROMUCOSAL
  Filled 2013-08-20 (×6): qty 7

## 2013-08-20 MED ORDER — FENTANYL CITRATE 0.05 MG/ML IJ SOLN: 50.0000 ug | Freq: Once | INTRAMUSCULAR | Status: AC
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
## 2013-08-21 ENCOUNTER — Inpatient Hospital Stay (HOSPITAL_COMMUNITY): Payer: Medicare Other

## 2013-08-21 LAB — CBC
HCT: 28.3 % — ABNORMAL LOW (ref 39.0–52.0)
Hemoglobin: 9.6 g/dL — ABNORMAL LOW (ref 13.0–17.0)
MCH: 31.5 pg (ref 26.0–34.0)
MCHC: 33.9 g/dL (ref 30.0–36.0)
MCV: 92.8 fL (ref 78.0–100.0)
Platelets: 197 10*3/uL (ref 150–400)
RBC: 3.05 MIL/uL — ABNORMAL LOW (ref 4.22–5.81)
RDW: 13.3 % (ref 11.5–15.5)
WBC: 12.2 10*3/uL — ABNORMAL HIGH (ref 4.0–10.5)

## 2013-08-21 LAB — URINE CULTURE
Colony Count: NO GROWTH
Culture: NO GROWTH
Special Requests: NORMAL

## 2013-08-21 LAB — GLUCOSE, CAPILLARY
Glucose-Capillary: 127 mg/dL — ABNORMAL HIGH (ref 70–99)
Glucose-Capillary: 108 mg/dL — ABNORMAL HIGH (ref 70–99)
Glucose-Capillary: 168 mg/dL — ABNORMAL HIGH (ref 70–99)
Glucose-Capillary: 114 mg/dL — ABNORMAL HIGH (ref 70–99)
Glucose-Capillary: 146 mg/dL — ABNORMAL HIGH (ref 70–99)

## 2013-08-21 LAB — BASIC METABOLIC PANEL
Anion gap: 14 (ref 5–15)
BUN: 29 mg/dL — ABNORMAL HIGH (ref 6–23)
CO2: 23 mEq/L (ref 19–32)
Calcium: 8.6 mg/dL (ref 8.4–10.5)
Chloride: 99 mEq/L (ref 96–112)
Creatinine, Ser: 1.06 mg/dL (ref 0.50–1.35)
GFR calc Af Amer: 76 mL/min — ABNORMAL LOW (ref >90)
GFR calc non Af Amer: 65 mL/min — ABNORMAL LOW (ref >90)
Glucose, Bld: 116 mg/dL — ABNORMAL HIGH (ref 70–99)
Potassium: 4.2 mEq/L (ref 3.7–5.3)
Sodium: 136 mEq/L — ABNORMAL LOW (ref 137–147)

## 2013-08-21 LAB — CLOSTRIDIUM DIFFICILE BY PCR: Toxigenic C. Difficile by PCR: NEGATIVE

## 2013-08-21 LAB — TRIGLYCERIDES: Triglycerides: 164 mg/dL — ABNORMAL HIGH (ref <150)

## 2013-08-21 MED ORDER — SODIUM CHLORIDE 0.9 % IJ SOLN
10.0000 mL | Freq: Two times a day (BID) | INTRAMUSCULAR | Status: DC
  Administered 2013-08-21 – 2013-08-23 (×5): 10 mL
  Administered 2013-08-23: 20 mL
  Administered 2013-08-24 – 2013-08-29 (×10): 10 mL
  Administered 2013-09-02: 20 mL

## 2013-08-21 MED ORDER — VANCOMYCIN HCL 10 G IV SOLR
1500.0000 mg | Freq: Once | INTRAVENOUS | Status: AC
  Administered 2013-08-21: 1500 mg via INTRAVENOUS
  Filled 2013-08-21: qty 1500

## 2013-08-21 MED ORDER — CETYLPYRIDINIUM CHLORIDE 0.05 % MT LIQD
7.0000 mL | Freq: Four times a day (QID) | OROMUCOSAL | Status: DC
  Administered 2013-08-21 – 2013-09-03 (×45): 7 mL via OROMUCOSAL

## 2013-08-21 MED ORDER — PIPERACILLIN-TAZOBACTAM 3.375 G IVPB
3.3750 g | Freq: Three times a day (TID) | INTRAVENOUS | Status: DC
  Administered 2013-08-21 – 2013-08-25 (×13): 3.375 g via INTRAVENOUS
  Filled 2013-08-21 (×15): qty 50

## 2013-08-21 MED ORDER — SODIUM CHLORIDE 0.9 % IJ SOLN
10.0000 mL | INTRAMUSCULAR | Status: DC | PRN
  Administered 2013-08-28 – 2013-09-01 (×4): 10 mL
  Administered 2013-09-01 – 2013-09-02 (×2): 20 mL
  Administered 2013-09-02: 10 mL

## 2013-08-21 MED ORDER — VANCOMYCIN HCL IN DEXTROSE 750-5 MG/150ML-% IV SOLN
750.0000 mg | Freq: Two times a day (BID) | INTRAVENOUS | Status: DC
  Administered 2013-08-21 – 2013-08-23 (×4): 750 mg via INTRAVENOUS
  Filled 2013-08-21 (×4): qty 150
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
## 2013-08-21 NOTE — Progress Notes (Signed)
Just as was starting a PIV with ultrasound the primary RN stopped me.   Informed that the PICC nurse was on her way now to insert PICC.

## 2013-08-21 NOTE — Progress Notes (Signed)
Patient ID: CAM DAUPHIN, male   DOB: December 01, 1932, 80 y.o.   MRN: [id number] No cuff leak. Will not extubate yet. Georganna Skeans, MD, MPH, FACS Trauma: [phone] General Surgery: [phone]

## 2013-08-21 NOTE — Progress Notes (Signed)
UR completed.  Ileanna Gemmill, RN BSN [facility] CCM Trauma/Neuro ICU Case Manager [phone]  

## 2013-08-21 NOTE — Progress Notes (Signed)
Pt. Was transported to CT & back to room 3M05 without any complications.
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
## 2013-08-21 NOTE — Progress Notes (Signed)
Peripherally Inserted Central Catheter/Midline Placement  The IV Nurse has discussed with the patient and/or persons authorized to consent for the patient, the purpose of this procedure and the potential benefits and risks involved with this procedure.  The benefits include less needle sticks, lab draws from the catheter and patient may be discharged home with the catheter.  Risks include, but not limited to, infection, bleeding, blood clot (thrombus formation), and puncture of an artery; nerve damage and irregular heat beat.  Alternatives to this procedure were also discussed.  PICC/Midline Placement Documentation        Austin [name] 08/21/2013, 2:57 PM Consent obtained by Claretha Cooper, RN from wife at bedside.

## 2013-08-21 NOTE — Clinical Social Work Note (Signed)
Clinical Social Worker continuing to follow patient and family for support.  Patient remains on the ventilator at this time with no family currently at bedside.  Patient family with a great deal of community support.  Per MD, patient is following commands on the right side and will likely get extubated once swelling in his neck goes down.  CSW remains available for support and to assist with discharge planning needs as needed.  Barbette Or, [place]

## 2013-08-22 DIAGNOSIS — I10 Essential (primary) hypertension: Secondary | ICD-10-CM

## 2013-08-22 DIAGNOSIS — E871 Hypo-osmolality and hyponatremia: Secondary | ICD-10-CM

## 2013-08-22 LAB — GLUCOSE, CAPILLARY
Glucose-Capillary: 121 mg/dL — ABNORMAL HIGH (ref 70–99)
Glucose-Capillary: 135 mg/dL — ABNORMAL HIGH (ref 70–99)
Glucose-Capillary: 136 mg/dL — ABNORMAL HIGH (ref 70–99)
Glucose-Capillary: 139 mg/dL — ABNORMAL HIGH (ref 70–99)
Glucose-Capillary: 140 mg/dL — ABNORMAL HIGH (ref 70–99)
Glucose-Capillary: 148 mg/dL — ABNORMAL HIGH (ref 70–99)
Glucose-Capillary: 153 mg/dL — ABNORMAL HIGH (ref 70–99)

## 2013-08-22 LAB — BASIC METABOLIC PANEL
Anion gap: 10 (ref 5–15)
BUN: 28 mg/dL — ABNORMAL HIGH (ref 6–23)
CO2: 24 mEq/L (ref 19–32)
Calcium: 8.7 mg/dL (ref 8.4–10.5)
Chloride: 100 mEq/L (ref 96–112)
Creatinine, Ser: 0.91 mg/dL (ref 0.50–1.35)
GFR calc Af Amer: >90 mL/min (ref >90)
GFR calc non Af Amer: 79 mL/min — ABNORMAL LOW (ref >90)
Glucose, Bld: 134 mg/dL — ABNORMAL HIGH (ref 70–99)
Potassium: 4 mEq/L (ref 3.7–5.3)
Sodium: 134 mEq/L — ABNORMAL LOW (ref 137–147)

## 2013-08-22 LAB — CBC
HCT: 30.2 % — ABNORMAL LOW (ref 39.0–52.0)
Hemoglobin: 10.2 g/dL — ABNORMAL LOW (ref 13.0–17.0)
MCH: 31.8 pg (ref 26.0–34.0)
MCHC: 33.8 g/dL (ref 30.0–36.0)
MCV: 94.1 fL (ref 78.0–100.0)
Platelets: 212 10*3/uL (ref 150–400)
RBC: 3.21 MIL/uL — ABNORMAL LOW (ref 4.22–5.81)
RDW: 13.2 % (ref 11.5–15.5)
WBC: 13.1 10*3/uL — ABNORMAL HIGH (ref 4.0–10.5)

## 2013-08-22 LAB — CSF CELL COUNT WITH DIFFERENTIAL
RBC Count, CSF: 6200 /mm3 — ABNORMAL HIGH
WBC, CSF: 4 /mm3 (ref 0–5)

## 2013-08-22 LAB — GRAM STAIN: Special Requests: NORMAL

## 2013-08-22 LAB — PROTEIN AND GLUCOSE, CSF
Glucose, CSF: 81 mg/dL — ABNORMAL HIGH (ref 43–76)
Total  Protein, CSF: 30 mg/dL (ref 15–45)

## 2013-08-22 MED ORDER — FENTANYL CITRATE 0.05 MG/ML IJ SOLN
50.0000 ug | INTRAMUSCULAR | Status: DC | PRN
  Administered 2013-08-22 – 2013-08-25 (×18): 100 ug via INTRAVENOUS
  Administered 2013-08-25 – 2013-08-26 (×2): 50 ug via INTRAVENOUS
  Administered 2013-08-27 (×2): 100 ug via INTRAVENOUS
  Filled 2013-08-22 (×22): qty 2

## 2013-08-22 MED ORDER — LOPERAMIDE HCL 1 MG/5ML PO LIQD
2.0000 mg | ORAL | Status: DC | PRN
Start: 2013-08-22 — End: 2013-09-03
  Administered 2013-08-23 (×2): 2 mg via ORAL
  Filled 2013-08-22: qty 10

## 2013-08-22 MED ORDER — FUROSEMIDE 10 MG/ML IJ SOLN
20.0000 mg | Freq: Once | INTRAMUSCULAR | Status: AC
  Administered 2013-08-22: 20 mg via INTRAVENOUS
  Filled 2013-08-22: qty 2

## 2013-08-22 MED ORDER — IBUPROFEN 100 MG/5ML PO SUSP
400.0000 mg | Freq: Three times a day (TID) | ORAL | Status: DC | PRN
  Administered 2013-08-22 – 2013-09-02 (×2): 400 mg via ORAL
  Filled 2013-08-22 (×2): qty 20

## 2013-08-22 MED ORDER — LOPERAMIDE HCL 1 MG/5ML PO LIQD
2.0000 mg | ORAL | Status: DC
  Filled 2013-08-22 (×6): qty 10
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
## 2013-08-22 NOTE — Progress Notes (Signed)
Right hand/digits swollen. Wounds at wrist clean, no evidence for infection. Will change to removeable velcro splint. I will be away next week, but if any questions related to right wrist, please call:  [facility] [phone]

## 2013-08-22 NOTE — Progress Notes (Signed)
RN called to request pt be put back on full vent support d/t pt w/ increased RR 30's and increased HR 110.

## 2013-08-22 NOTE — Plan of Care (Signed)
Problem: Phase II Progression Outcomes Goal: Improved ventilation/oxygenation Outcome: Progressing Patient remains intubated. Sedation vacation completed this am and propofol was restarted at half dose. Propofol now at 15 mcg/kg/hr. Patient follows commands and moves right side spontaneously. CPAP on the ventilator 30% FiO2, PEEP 5/ PS 5. Ventilation adequate, synchronous with the ventilator, no desaturation. Remains intubated for airway protection until adequate cuff leak is present. Small amount of edema present to throat. Lasix ordered this am. Will reassess tomorrow.

## 2013-08-22 NOTE — Progress Notes (Signed)
Orthopedic Tech Progress Note Patient Details:  Austin [name] Feb 08, 1932 [id number]  Ortho Devices Type of Ortho Device: Velcro wrist forearm splint Ortho Device/Splint Location: RUE Ortho Device/Splint Interventions: Application   Cammer, Theodoro Parma 08/22/2013, 3:28 PM

## 2013-08-23 ENCOUNTER — Inpatient Hospital Stay (HOSPITAL_COMMUNITY): Payer: Medicare Other

## 2013-08-23 LAB — BASIC METABOLIC PANEL
Anion gap: 15 (ref 5–15)
BUN: 50 mg/dL — ABNORMAL HIGH (ref 6–23)
CO2: 21 mEq/L (ref 19–32)
Calcium: 8.3 mg/dL — ABNORMAL LOW (ref 8.4–10.5)
Chloride: 97 mEq/L (ref 96–112)
Creatinine, Ser: 2.17 mg/dL — ABNORMAL HIGH (ref 0.50–1.35)
GFR calc Af Amer: 32 mL/min — ABNORMAL LOW (ref >90)
GFR calc non Af Amer: 27 mL/min — ABNORMAL LOW (ref >90)
Glucose, Bld: 133 mg/dL — ABNORMAL HIGH (ref 70–99)
Potassium: 4.5 mEq/L (ref 3.7–5.3)
Sodium: 133 mEq/L — ABNORMAL LOW (ref 137–147)

## 2013-08-23 LAB — VANCOMYCIN, TROUGH: Vancomycin Tr: 23.9 ug/mL — ABNORMAL HIGH (ref 10.0–20.0)

## 2013-08-23 LAB — GLUCOSE, CAPILLARY
Glucose-Capillary: 142 mg/dL — ABNORMAL HIGH (ref 70–99)
Glucose-Capillary: 173 mg/dL — ABNORMAL HIGH (ref 70–99)
Glucose-Capillary: 153 mg/dL — ABNORMAL HIGH (ref 70–99)
Glucose-Capillary: 151 mg/dL — ABNORMAL HIGH (ref 70–99)

## 2013-08-23 LAB — CBC
HCT: 28.4 % — ABNORMAL LOW (ref 39.0–52.0)
Hemoglobin: 9.7 g/dL — ABNORMAL LOW (ref 13.0–17.0)
MCH: 31.4 pg (ref 26.0–34.0)
MCHC: 34.2 g/dL (ref 30.0–36.0)
MCV: 91.9 fL (ref 78.0–100.0)
Platelets: 253 10*3/uL (ref 150–400)
RBC: 3.09 MIL/uL — ABNORMAL LOW (ref 4.22–5.81)
RDW: 13.3 % (ref 11.5–15.5)
WBC: 13.9 10*3/uL — ABNORMAL HIGH (ref 4.0–10.5)

## 2013-08-23 MED ORDER — VANCOMYCIN HCL 10 G IV SOLR
1250.0000 mg | INTRAVENOUS | Status: DC
  Filled 2013-08-23: qty 1250

## 2013-08-23 MED ORDER — PANTOPRAZOLE SODIUM 40 MG PO PACK
40.0000 mg | PACK | Freq: Every day | ORAL | Status: DC
  Administered 2013-08-24 – 2013-09-03 (×10): 40 mg
  Filled 2013-08-23 (×13): qty 20

## 2013-08-23 MED ORDER — FLUCONAZOLE IN SODIUM CHLORIDE 200-0.9 MG/100ML-% IV SOLN
200.0000 mg | INTRAVENOUS | Status: DC
  Administered 2013-08-23: 200 mg via INTRAVENOUS
  Filled 2013-08-23 (×2): qty 100

## 2013-08-23 NOTE — Progress Notes (Signed)
RN called me to bedside d/t RN increasing Propofol d/t pt w/ increased RR and increased WOB.  Placed pt back on full vent support.

## 2013-08-23 NOTE — Progress Notes (Signed)
Patient ID: Austin [name], male   DOB: [birth year], 80 y.o.   MRN: [id number] BP 171/76  Pulse 102  Temp(Src) 99 F (37.2 C) (Oral)  Resp 21  Ht 5' 10"  (1.778 m)  Wt 85.7 kg (188 lb 15 oz)  BMI 27.11 kg/m2  SpO2 99% Alert, will follow commands Moving left side well, some movement on the right Head ct did not show enlarging ventricles Removed ventricular cAtheter without difficulty.

## 2013-08-23 NOTE — Progress Notes (Signed)
ANTIBIOTIC CONSULT NOTE - FOLLOW UP  Pharmacy Consult for vancomycin Indication: sepsis; fevers  Labs:  Recent Labs  08/21/13 0311 08/22/13 08/23/13  WBC 12.2* 13.1* 13.9*  HGB 9.6* 10.2* 9.7*  PLT 197 212 253  CREATININE 1.06 0.91 2.17*   Estimated Creatinine Clearance: 29 ml/min (by C-G formula based on Cr of 2.17).  Recent Labs  08/23/13 0530  VANCOTROUGH 23.9*     Microbiology: Recent Results (from the past 720 hour(s))  MRSA PCR SCREENING     Status: None   Collection Time    08/15/13 11:58 AM      Result Value Ref Range Status   MRSA by PCR NEGATIVE  NEGATIVE Final   Comment:            The GeneXpert MRSA Assay (FDA     approved for NASAL specimens     only), is one component of a     comprehensive MRSA colonization     surveillance program. It is not     intended to diagnose MRSA     infection nor to guide or     monitor treatment for     MRSA infections.  URINE CULTURE     Status: None   Collection Time    08/20/13  3:03 PM      Result Value Ref Range Status   Specimen Description URINE, RANDOM   Final   Special Requests Normal   Final   Culture  Setup Time     Final   Value: 08/20/2013 15:35     Performed at [facility]     Final   Value: NO GROWTH     Performed at [facility]   Culture     Final   Value: NO GROWTH     Performed at [facility]   Report Status 08/21/2013 FINAL   Final  CULTURE, RESPIRATORY (NON-EXPECTORATED)     Status: None   Collection Time    08/21/13  8:22 AM      Result Value Ref Range Status   Specimen Description TRACHEAL ASPIRATE   Final   Special Requests Normal   Final   Gram Stain     Final   Value: FEW WBC PRESENT,BOTH PMN AND MONONUCLEAR     RARE SQUAMOUS EPITHELIAL CELLS PRESENT     RARE GRAM NEGATIVE RODS     Performed at [facility]   Culture     Final   Value: Culture reincubated for better growth     Performed at [facility]   Report Status  PENDING   Incomplete  CULTURE, BLOOD (ROUTINE X 2)     Status: None   Collection Time    08/21/13 10:05 AM      Result Value Ref Range Status   Specimen Description BLOOD LEFT ANTECUBITAL   Final   Special Requests BOTTLES DRAWN AEROBIC ONLY 5CC   Final   Culture  Setup Time     Final   Value: 08/21/2013 16:05     Performed at [facility]   Culture     Final   Value:        BLOOD CULTURE RECEIVED NO GROWTH TO DATE CULTURE WILL BE HELD FOR 5 DAYS BEFORE ISSUING A FINAL NEGATIVE REPORT     Performed at [facility]   Report Status PENDING   Incomplete  CULTURE, BLOOD (ROUTINE X 2)     Status: None  Collection Time    08/21/13 10:15 AM      Result Value Ref Range Status   Specimen Description BLOOD RIGHT ANTECUBITAL   Final   Special Requests BOTTLES DRAWN AEROBIC AND ANAEROBIC 10CC   Final   Culture  Setup Time     Final   Value: 08/21/2013 16:05     Performed at [facility]   Culture     Final   Value:        BLOOD CULTURE RECEIVED NO GROWTH TO DATE CULTURE WILL BE HELD FOR 5 DAYS BEFORE ISSUING A FINAL NEGATIVE REPORT     Performed at [facility]   Report Status PENDING   Incomplete  CLOSTRIDIUM DIFFICILE BY PCR     Status: None   Collection Time    08/21/13  2:17 PM      Result Value Ref Range Status   C difficile by pcr NEGATIVE  NEGATIVE Final  CSF CULTURE     Status: None   Collection Time    08/22/13  8:42 AM      Result Value Ref Range Status   Specimen Description CSF   Final   Special Requests Normal   Final   Gram Stain     Final   Value: CYTOSPIN SLIDE WBC PRESENT,BOTH PMN AND MONONUCLEAR     NO ORGANISMS SEEN     Performed at [facility]     Performed at [facility]   Culture PENDING   Incomplete   Report Status PENDING   Incomplete  GRAM STAIN     Status: None   Collection Time    08/22/13  8:42 AM      Result Value Ref Range Status   Specimen Description CSF   Final   Special Requests Normal   Final    Gram Stain     Final   Value: CYTOSPIN SLIDE     WBC PRESENT,BOTH PMN AND MONONUCLEAR     NO ORGANISMS SEEN   Report Status 08/22/2013 FINAL   Final     Assessment: 80yo male on Vancomycin and Zosyn (Day #3) for possible sepsis, fevers. Diflucan added today. Tmax= 101.6, WBC= 13.9-trending up.   Vancomycin trough this a.m. = 23.9 (supratherapeutic) - noted 750mg  IV dose given after this trough was drawn. BMET shows huge jump in Scr to 2.17 today. UOP down.  7/30 Vanc>> 7/30 Zosyn>> 8/1 Diflucan>>  7/30 blood x2- ngtd 7/30 resp- rare GNR 7/29 urine- neg 7/31 csf - neg 7/30 c diff- neg  Goal of Therapy:  Vancomycin trough level 15-20 mcg/ml  Plan:  - Hold Vancomycin for now with large jump in SCr, decreased UOP, and high trough this a.m. - Check random Vanc level in a.m. to help assess clearance - plan to redose if <20. - Continue zosyn 3.375gm IV q8h. - F/u renal function, UOP, micro data  Sherlon Handing, PharmD, BCPS Clinical pharmacist, pager [phone] 08/23/2013,11:14 AM
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
## 2013-08-23 NOTE — Progress Notes (Signed)
Pt w/ increased RR >35, increased ps to 10.  RN aware

## 2013-08-23 NOTE — Progress Notes (Signed)
PT Cancellation Note  Patient Details Name: Austin [name] MRN: [id number] DOB: [birth year]   Cancelled Treatment:    Reason Eval/Treat Not Completed: Patient not medically ready. RN deferred at this time due to pt having to go back on full vent support and increasing propofol. PT to return as able/when appropriate.   Kingsley Callander 08/23/2013, 2:05 PM  Kittie Plater, PT, DPT Pager #: [phone] Office #: [phone]

## 2013-08-23 NOTE — Progress Notes (Addendum)
Transported patient to CT on FIO2 100%.  Patient back from CT, no complications.  Sp02 100%, HR 97, RR 24.

## 2013-08-24 ENCOUNTER — Inpatient Hospital Stay (HOSPITAL_COMMUNITY): Payer: Medicare Other

## 2013-08-24 LAB — GLUCOSE, CAPILLARY
Glucose-Capillary: 131 mg/dL — ABNORMAL HIGH (ref 70–99)
Glucose-Capillary: 133 mg/dL — ABNORMAL HIGH (ref 70–99)
Glucose-Capillary: 147 mg/dL — ABNORMAL HIGH (ref 70–99)
Glucose-Capillary: 149 mg/dL — ABNORMAL HIGH (ref 70–99)
Glucose-Capillary: 151 mg/dL — ABNORMAL HIGH (ref 70–99)
Glucose-Capillary: 156 mg/dL — ABNORMAL HIGH (ref 70–99)
Glucose-Capillary: 157 mg/dL — ABNORMAL HIGH (ref 70–99)

## 2013-08-24 LAB — CULTURE, RESPIRATORY

## 2013-08-24 LAB — CULTURE, RESPIRATORY W GRAM STAIN: Special Requests: NORMAL

## 2013-08-24 LAB — VANCOMYCIN, RANDOM: Vancomycin Rm: 27 ug/mL

## 2013-08-24 LAB — TRIGLYCERIDES: Triglycerides: 161 mg/dL — ABNORMAL HIGH (ref <150)

## 2013-08-24 MED ORDER — FLUCONAZOLE IN SODIUM CHLORIDE 200-0.9 MG/100ML-% IV SOLN
200.0000 mg | INTRAVENOUS | Status: DC
  Administered 2013-08-24 – 2013-08-25 (×2): 200 mg via INTRAVENOUS
  Filled 2013-08-24 (×3): qty 100
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
## 2013-08-24 NOTE — Progress Notes (Signed)
Patient ID: Austin [name], male   DOB: [birth year], 80 y.o.   MRN: [id number] BP 162/75  Pulse 93  Temp(Src) 99.2 F (37.3 C) (Axillary)  Resp 22  Ht 5' 10"  (1.778 m)  Wt 87.5 kg (192 lb 14.4 oz)  BMI 27.68 kg/m2  SpO2 100% Alert, oriented. Follows commands Moving right side well, plegic on left Weaning vent at this time

## 2013-08-24 NOTE — Progress Notes (Signed)
Pt appears slightly more agitated, increased RR noted. Increased ps to 10 then 15.  RR now 25, sat 99%.  No distress noted post change presently.

## 2013-08-24 NOTE — Progress Notes (Signed)
Called to bedside d/t pt w/ increased RR and increased WOB while pt on vent wean.  Pt was placed back on full vent support.  RN aware.

## 2013-08-24 NOTE — Progress Notes (Signed)
ANTIBIOTIC CONSULT NOTE - FOLLOW UP  Pharmacy Consult for vancomycin Indication: sepsis; fevers  Labs:  Recent Labs  08/22/13 08/23/13  WBC 13.1* 13.9*  HGB 10.2* 9.7*  PLT 212 253  CREATININE 0.91 2.17*   Estimated Creatinine Clearance: 29 ml/min (by C-G formula based on Cr of 2.17).  Recent Labs  08/23/13 0530 08/24/13 0640  VANCOTROUGH 23.9*  --   VANCORANDOM  --  27.0     Microbiology: Recent Results (from the past 720 hour(s))  MRSA PCR SCREENING     Status: None   Collection Time    08/15/13 11:58 AM      Result Value Ref Range Status   MRSA by PCR NEGATIVE  NEGATIVE Final   Comment:            The GeneXpert MRSA Assay (FDA     approved for NASAL specimens     only), is one component of a     comprehensive MRSA colonization     surveillance program. It is not     intended to diagnose MRSA     infection nor to guide or     monitor treatment for     MRSA infections.  URINE CULTURE     Status: None   Collection Time    08/20/13  3:03 PM      Result Value Ref Range Status   Specimen Description URINE, RANDOM   Final   Special Requests Normal   Final   Culture  Setup Time     Final   Value: 08/20/2013 15:35     Performed at [facility]     Final   Value: NO GROWTH     Performed at [facility]   Culture     Final   Value: NO GROWTH     Performed at [facility]   Report Status 08/21/2013 FINAL   Final  CULTURE, RESPIRATORY (NON-EXPECTORATED)     Status: None   Collection Time    08/21/13  8:22 AM      Result Value Ref Range Status   Specimen Description TRACHEAL ASPIRATE   Final   Special Requests Normal   Final   Gram Stain     Final   Value: FEW WBC PRESENT,BOTH PMN AND MONONUCLEAR     RARE SQUAMOUS EPITHELIAL CELLS PRESENT     RARE GRAM NEGATIVE RODS     Performed at [facility]   Culture     Final   Value: MODERATE GRAM NEGATIVE RODS     Performed at [facility]   Report Status  PENDING   Incomplete  CULTURE, BLOOD (ROUTINE X 2)     Status: None   Collection Time    08/21/13 10:05 AM      Result Value Ref Range Status   Specimen Description BLOOD LEFT ANTECUBITAL   Final   Special Requests BOTTLES DRAWN AEROBIC ONLY 5CC   Final   Culture  Setup Time     Final   Value: 08/21/2013 16:05     Performed at [facility]   Culture     Final   Value:        BLOOD CULTURE RECEIVED NO GROWTH TO DATE CULTURE WILL BE HELD FOR 5 DAYS BEFORE ISSUING A FINAL NEGATIVE REPORT     Performed at [facility]   Report Status PENDING   Incomplete  CULTURE, BLOOD (ROUTINE X 2)  Status: None   Collection Time    08/21/13 10:15 AM      Result Value Ref Range Status   Specimen Description BLOOD RIGHT ANTECUBITAL   Final   Special Requests BOTTLES DRAWN AEROBIC AND ANAEROBIC 10CC   Final   Culture  Setup Time     Final   Value: 08/21/2013 16:05     Performed at [facility]   Culture     Final   Value:        BLOOD CULTURE RECEIVED NO GROWTH TO DATE CULTURE WILL BE HELD FOR 5 DAYS BEFORE ISSUING A FINAL NEGATIVE REPORT     Performed at [facility]   Report Status PENDING   Incomplete  CLOSTRIDIUM DIFFICILE BY PCR     Status: None   Collection Time    08/21/13  2:17 PM      Result Value Ref Range Status   C difficile by pcr NEGATIVE  NEGATIVE Final  CSF CULTURE     Status: None   Collection Time    08/22/13  8:42 AM      Result Value Ref Range Status   Specimen Description CSF   Final   Special Requests Normal   Final   Gram Stain     Final   Value: CYTOSPIN SLIDE WBC PRESENT,BOTH PMN AND MONONUCLEAR     NO ORGANISMS SEEN     Performed at [facility]     Performed at [facility]   Culture     Final   Value: NO GROWTH 1 DAY     Performed at [facility]   Report Status PENDING   Incomplete  GRAM STAIN     Status: None   Collection Time    08/22/13  8:42 AM      Result Value Ref Range Status   Specimen  Description CSF   Final   Special Requests Normal   Final   Gram Stain     Final   Value: CYTOSPIN SLIDE     WBC PRESENT,BOTH PMN AND MONONUCLEAR     NO ORGANISMS SEEN   Report Status 08/22/2013 FINAL   Final     Assessment: 80yo male on Vancomycin and Zosyn (Day #4) and Diflucan (Day #2)for possible sepsis, fevers. Tmax= 101.6, WBC= 13.9 slight upward trend.   Vancomycin trough 8/1 = 23.9 (supratherapeutic) - noted 750mg  IV dose given after this trough was drawn. BMET shows huge jump in Scr to 2.17 on 8/1.  Random Vanc level 8/2 = 27 (supratherapeutic). No BMET done today. UOP not being charted accurately.  7/30 Vanc>> 7/30 Zosyn>> 8/1 Diflucan>>  7/30 blood x2- ngtd 7/30 resp- rare GNR 7/29 urine- neg 7/31 csf - ngtd 7/30 c diff- neg  Goal of Therapy:  Vancomycin trough level 15-20 mcg/ml  Plan:  - Continue to hold Vancomycin for now.  - Will check random Vanc level in a.m. so that we can do kinetics. - Continue zosyn 3.375gm IV q8h. - F/u renal function, UOP, micro data  Sherlon Handing, PharmD, BCPS Clinical pharmacist, pager [phone] 08/24/2013,10:38 AM
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
## 2013-08-25 DIAGNOSIS — J95851 Ventilator associated pneumonia: Secondary | ICD-10-CM

## 2013-08-25 LAB — BASIC METABOLIC PANEL
Anion gap: 11 (ref 5–15)
BUN: 41 mg/dL — ABNORMAL HIGH (ref 6–23)
CO2: 22 mEq/L (ref 19–32)
Calcium: 8.7 mg/dL (ref 8.4–10.5)
Chloride: 110 mEq/L (ref 96–112)
Creatinine, Ser: 1.44 mg/dL — ABNORMAL HIGH (ref 0.50–1.35)
GFR calc Af Amer: 52 mL/min — ABNORMAL LOW (ref >90)
GFR calc non Af Amer: 45 mL/min — ABNORMAL LOW (ref >90)
Glucose, Bld: 141 mg/dL — ABNORMAL HIGH (ref 70–99)
Potassium: 4.2 mEq/L (ref 3.7–5.3)
Sodium: 143 mEq/L (ref 137–147)

## 2013-08-25 LAB — CBC
HCT: 27.4 % — ABNORMAL LOW (ref 39.0–52.0)
Hemoglobin: 9 g/dL — ABNORMAL LOW (ref 13.0–17.0)
MCH: 30.8 pg (ref 26.0–34.0)
MCHC: 32.8 g/dL (ref 30.0–36.0)
MCV: 93.8 fL (ref 78.0–100.0)
Platelets: 252 10*3/uL (ref 150–400)
RBC: 2.92 MIL/uL — ABNORMAL LOW (ref 4.22–5.81)
RDW: 13.8 % (ref 11.5–15.5)
WBC: 13.2 10*3/uL — ABNORMAL HIGH (ref 4.0–10.5)

## 2013-08-25 LAB — GLUCOSE, CAPILLARY
Glucose-Capillary: 153 mg/dL — ABNORMAL HIGH (ref 70–99)
Glucose-Capillary: 148 mg/dL — ABNORMAL HIGH (ref 70–99)
Glucose-Capillary: 131 mg/dL — ABNORMAL HIGH (ref 70–99)
Glucose-Capillary: 112 mg/dL — ABNORMAL HIGH (ref 70–99)
Glucose-Capillary: 110 mg/dL — ABNORMAL HIGH (ref 70–99)
Glucose-Capillary: 108 mg/dL — ABNORMAL HIGH (ref 70–99)

## 2013-08-25 LAB — CSF CULTURE W GRAM STAIN
Culture: NO GROWTH
Special Requests: NORMAL

## 2013-08-25 LAB — VANCOMYCIN, RANDOM: Vancomycin Rm: 8.7 ug/mL

## 2013-08-25 MED ORDER — PROPOFOL 10 MG/ML IV BOLUS
INTRAVENOUS | Status: AC
  Filled 2013-08-25: qty 20

## 2013-08-25 MED ORDER — METOPROLOL TARTRATE 1 MG/ML IV SOLN
12.5000 mg | Freq: Two times a day (BID) | INTRAVENOUS | Status: DC
  Filled 2013-08-25: qty 15

## 2013-08-25 MED ORDER — STERILE WATER FOR INJECTION IJ SOLN
INTRAMUSCULAR | Status: AC
  Filled 2013-08-25: qty 10

## 2013-08-25 MED ORDER — VANCOMYCIN HCL IN DEXTROSE 750-5 MG/150ML-% IV SOLN
750.0000 mg | Freq: Once | INTRAVENOUS | Status: DC
  Filled 2013-08-25 (×2): qty 150

## 2013-08-25 MED ORDER — EPHEDRINE SULFATE 50 MG/ML IJ SOLN
INTRAMUSCULAR | Status: AC
Start: 2013-08-25 — End: 2013-08-25
  Filled 2013-08-25: qty 1

## 2013-08-25 MED ORDER — ONDANSETRON HCL 4 MG/2ML IJ SOLN
INTRAMUSCULAR | Status: AC
  Filled 2013-08-25: qty 2

## 2013-08-25 MED ORDER — SODIUM CHLORIDE 0.9 % IJ SOLN
INTRAMUSCULAR | Status: AC
  Filled 2013-08-25: qty 10

## 2013-08-25 MED ORDER — CEFAZOLIN SODIUM-DEXTROSE 2-3 GM-% IV SOLR
INTRAVENOUS | Status: AC
  Filled 2013-08-25: qty 50

## 2013-08-25 MED ORDER — ROCURONIUM BROMIDE 50 MG/5ML IV SOLN
INTRAVENOUS | Status: AC
  Filled 2013-08-25: qty 1

## 2013-08-25 MED ORDER — ARTIFICIAL TEARS OP OINT
TOPICAL_OINTMENT | OPHTHALMIC | Status: AC
  Filled 2013-08-25: qty 3.5

## 2013-08-25 MED ORDER — LABETALOL HCL 5 MG/ML IV SOLN
INTRAVENOUS | Status: AC
  Filled 2013-08-25: qty 4

## 2013-08-25 MED ORDER — LIDOCAINE HCL (CARDIAC) 20 MG/ML IV SOLN
INTRAVENOUS | Status: AC
  Filled 2013-08-25: qty 5

## 2013-08-25 MED ORDER — VECURONIUM BROMIDE 10 MG IV SOLR
INTRAVENOUS | Status: AC
  Filled 2013-08-25: qty 10

## 2013-08-25 MED ORDER — SUCCINYLCHOLINE CHLORIDE 20 MG/ML IJ SOLN
INTRAMUSCULAR | Status: AC
  Filled 2013-08-25: qty 1

## 2013-08-25 MED ORDER — GLYCOPYRROLATE 0.2 MG/ML IJ SOLN
INTRAMUSCULAR | Status: AC
  Filled 2013-08-25: qty 3

## 2013-08-25 MED ORDER — DEXAMETHASONE SODIUM PHOSPHATE 4 MG/ML IJ SOLN
INTRAMUSCULAR | Status: AC
  Filled 2013-08-25: qty 1

## 2013-08-25 MED ORDER — METOPROLOL TARTRATE 1 MG/ML IV SOLN
5.0000 mg | Freq: Four times a day (QID) | INTRAVENOUS | Status: DC
  Administered 2013-08-25 – 2013-08-31 (×22): 5 mg via INTRAVENOUS
  Filled 2013-08-25 (×25): qty 5

## 2013-08-25 MED ORDER — MIDAZOLAM HCL 2 MG/2ML IJ SOLN
INTRAMUSCULAR | Status: AC
  Filled 2013-08-25: qty 2

## 2013-08-25 MED ORDER — FENTANYL CITRATE 0.05 MG/ML IJ SOLN
INTRAMUSCULAR | Status: AC
  Filled 2013-08-25: qty 5

## 2013-08-25 MED ORDER — NEOSTIGMINE METHYLSULFATE 10 MG/10ML IV SOLN
INTRAVENOUS | Status: AC
  Filled 2013-08-25: qty 1

## 2013-08-25 MED ORDER — EPHEDRINE SULFATE 50 MG/ML IJ SOLN
INTRAMUSCULAR | Status: AC
  Filled 2013-08-25: qty 1

## 2013-08-25 MED ORDER — LEVOFLOXACIN IN D5W 750 MG/150ML IV SOLN
750.0000 mg | INTRAVENOUS | Status: DC
  Administered 2013-08-25 – 2013-08-27 (×2): 750 mg via INTRAVENOUS
  Filled 2013-08-25 (×2): qty 150

## 2013-08-25 NOTE — Procedures (Signed)
Extubation Procedure Note  Patient Details:   Name: Austin [name] DOB: [birth year] MRN: [id number]   Airway Documentation:     Evaluation  O2 sats: stable throughout Complications: No apparent complications Patient did tolerate procedure well. Bilateral Breath Sounds: Clear;Diminished Suctioning: Oral;Airway Yes, T.O. for extubation, per DR. Hulen Skains, RN Katie aaware, no Stridor noted, slight Insp. mild snoring present, per family members @ bedside is normal for pt., RN @ bedside, tolerated procedure well, NIF -(-20)cmh20, FVC (.8)L,some noted difficulty with understanding, expected with condition, had(+) cuff leak prior to procedure, placed pt. On 2 lpm n/c, plan to start I/S asap,  RT to monitor.  Winferd Humphrey 08/25/2013, 10:41 AM

## 2013-08-25 NOTE — Clinical Social Work Note (Signed)
Clinical Social Worker continuing to follow patient and family for support and discharge planning needs.  Patient was extubated this morning and is currently maintaining well.  CSW spoke with patient granddaughter at bedside who states that patient family is doing well and optimistic for patient recovery.  CSW will await therapy recommendations for next venue at discharge.  CSW remains available for support to patient and patient family and to facilitate appropriate discharge needs.  Barbette Or, [place]

## 2013-08-25 NOTE — Progress Notes (Signed)
ANTIBIOTIC CONSULT NOTE - FOLLOW UP  Pharmacy Consult for vancomycin Indication: sepsis  Labs:  Recent Labs  08/23/13 08/25/13 0555  WBC 13.9* 13.2*  HGB 9.7* 9.0*  PLT 253 252  CREATININE 2.17* 1.44*   Estimated Creatinine Clearance: 43.7 ml/min (by C-G formula based on Cr of 1.44).  Recent Labs  08/23/13 0530 08/24/13 0640 08/25/13 0555  VANCOTROUGH 23.9*  --   --   VANCORANDOM  --  27.0 8.7      Assessment/Plan:  Random vanc level this am now below goal; will give vanc 750mg  IV x1 now but SCr is now improving so still unclear what clearance would truly be so will continue to monitor.  Wynona Neat, PharmD, BCPS  08/25/2013,7:09 AM
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
## 2013-08-25 NOTE — Progress Notes (Signed)
Follow up - Trauma and Critical Care  Patient Details:    Austin [name] is an 80 y.o. male.  Lines/tubes : Airway 7.5 mm (Active)  Secured at (cm) 23 cm 08/25/2013  3:40 AM  Measured From Lips 08/25/2013  3:40 AM  Secured Location Center 08/25/2013  3:40 AM  Secured By [employer] 08/25/2013  3:40 AM  Tube Holder Repositioned Yes 08/25/2013  3:40 AM  Cuff Pressure (cm H2O) 28 cm H2O 08/24/2013 11:25 PM  Site Condition Dry 08/25/2013  3:40 AM     PICC / Midline Double Lumen 34/91/79 PICC Right Basilic 42 cm 1 cm (Active)  Indication for Insertion or Continuance of Line Prolonged intravenous therapies 08/24/2013  8:00 PM  Exposed Catheter (cm) 1 cm 08/21/2013  2:59 PM  Site Assessment Clean;Dry;Intact 08/24/2013  8:00 PM  Lumen #1 Status Infusing 08/24/2013  8:00 PM  Lumen #2 Status Infusing 08/24/2013  8:00 PM  Dressing Type Transparent;Securing device 08/24/2013  8:00 PM  Dressing Status Clean;Dry;Intact;Antimicrobial disc in place 08/24/2013  8:00 PM  Line Care Connections checked and tightened 08/24/2013  8:00 PM  Dressing Change Due 08/28/13 08/24/2013  8:00 PM     NG/OG Tube Orogastric 16 Fr. (Active)  Placement Verification Auscultation 08/24/2013  8:00 PM  Site Assessment Clean;Dry;Intact 08/24/2013  8:00 PM  Status Infusing tube feed 08/24/2013  8:00 PM  Drainage Appearance Tan 08/24/2013  4:00 AM  Gastric Residual 0 mL 08/24/2013  8:00 PM  Intake (mL) 100 mL 08/24/2013  4:00 AM  Output (mL) 400 mL 08/16/2013  6:00 AM     Urethral Catheter Kasandra Knudsen, RN Non-latex 16 Fr. (Active)  Indication for Insertion or Continuance of Catheter Acute urinary retention 08/24/2013  8:00 PM  Site Assessment Clean;Intact 08/24/2013  8:00 PM  Catheter Maintenance Bag below level of bladder;Catheter secured;Drainage bag/tubing not touching floor;No dependent loops;Seal intact 08/24/2013  8:00 PM  Collection Container Standard drainage bag 08/24/2013  8:00 PM  Securement Method Leg strap 08/24/2013  8:00 PM  Urinary  Catheter Interventions Unclamped 08/24/2013  8:00 PM  Output (mL) 325 mL 08/25/2013  7:46 AM    Microbiology/Sepsis markers: Results for orders placed during the hospital encounter of 08/15/13  MRSA PCR SCREENING     Status: None   Collection Time    08/15/13 11:58 AM      Result Value Ref Range Status   MRSA by PCR NEGATIVE  NEGATIVE Final   Comment:            The GeneXpert MRSA Assay (FDA     approved for NASAL specimens     only), is one component of a     comprehensive MRSA colonization     surveillance program. It is not     intended to diagnose MRSA     infection nor to guide or     monitor treatment for     MRSA infections.  URINE CULTURE     Status: None   Collection Time    08/20/13  3:03 PM      Result Value Ref Range Status   Specimen Description URINE, RANDOM   Final   Special Requests Normal   Final   Culture  Setup Time     Final   Value: 08/20/2013 15:35     Performed at [facility]     Final   Value: NO GROWTH     Performed at [facility]  Final   Value: NO GROWTH     Performed at [facility]   Report Status 08/21/2013 FINAL   Final  CULTURE, RESPIRATORY (NON-EXPECTORATED)     Status: None   Collection Time    08/21/13  8:22 AM      Result Value Ref Range Status   Specimen Description TRACHEAL ASPIRATE   Final   Special Requests Normal   Final   Gram Stain     Final   Value: FEW WBC PRESENT,BOTH PMN AND MONONUCLEAR     RARE SQUAMOUS EPITHELIAL CELLS PRESENT     RARE GRAM NEGATIVE RODS     Performed at [facility]   Culture     Final   Value: MODERATE ENTEROBACTER [place]     Performed at [facility]   Report Status 08/24/2013 FINAL   Final   Organism ID, Bacteria ENTEROBACTER ASBURIAE   Final   Organism ID, Bacteria PROTEUS MIRABILIS   Final  CULTURE, BLOOD (ROUTINE X 2)     Status: None   Collection Time    08/21/13 10:05 AM      Result Value  Ref Range Status   Specimen Description BLOOD LEFT ANTECUBITAL   Final   Special Requests BOTTLES DRAWN AEROBIC ONLY 5CC   Final   Culture  Setup Time     Final   Value: 08/21/2013 16:05     Performed at [facility]   Culture     Final   Value:        BLOOD CULTURE RECEIVED NO GROWTH TO DATE CULTURE WILL BE HELD FOR 5 DAYS BEFORE ISSUING A FINAL NEGATIVE REPORT     Performed at [facility]   Report Status PENDING   Incomplete  CULTURE, BLOOD (ROUTINE X 2)     Status: None   Collection Time    08/21/13 10:15 AM      Result Value Ref Range Status   Specimen Description BLOOD RIGHT ANTECUBITAL   Final   Special Requests BOTTLES DRAWN AEROBIC AND ANAEROBIC 10CC   Final   Culture  Setup Time     Final   Value: 08/21/2013 16:05     Performed at [facility]   Culture     Final   Value:        BLOOD CULTURE RECEIVED NO GROWTH TO DATE CULTURE WILL BE HELD FOR 5 DAYS BEFORE ISSUING A FINAL NEGATIVE REPORT     Performed at [facility]   Report Status PENDING   Incomplete  CLOSTRIDIUM DIFFICILE BY PCR     Status: None   Collection Time    08/21/13  2:17 PM      Result Value Ref Range Status   C difficile by pcr NEGATIVE  NEGATIVE Final  CSF CULTURE     Status: None   Collection Time    08/22/13  8:42 AM      Result Value Ref Range Status   Specimen Description CSF   Final   Special Requests Normal   Final   Gram Stain     Final   Value: CYTOSPIN SLIDE WBC PRESENT,BOTH PMN AND MONONUCLEAR     NO ORGANISMS SEEN     Performed at [facility]     Performed at [facility]   Culture     Final   Value: NO GROWTH 2 DAYS     Performed at [facility]   Report Status PENDING   Incomplete  GRAM STAIN     Status: None   Collection Time    08/22/13  8:42 AM      Result Value Ref Range Status   Specimen Description CSF   Final   Special Requests Normal   Final   Gram Stain     Final   Value: CYTOSPIN SLIDE     WBC PRESENT,BOTH PMN  AND MONONUCLEAR     NO ORGANISMS SEEN   Report Status 08/22/2013 FINAL   Final    Anti-infectives:  Anti-infectives   Start     Dose/Rate Route Frequency Ordered Stop   08/25/13 0715  vancomycin (VANCOCIN) IVPB 750 mg/150 ml premix     750 mg 150 mL/hr over 60 Minutes Intravenous  Once 08/25/13 0709     08/24/13 0900  fluconazole (DIFLUCAN) IVPB 200 mg     200 mg 100 mL/hr over 60 Minutes Intravenous Every 24 hours 08/24/13 0835     08/23/13 2000  vancomycin (VANCOCIN) 1,250 mg in sodium chloride 0.9 % 250 mL IVPB  Status:  Discontinued     1,250 mg 166.7 mL/hr over 90 Minutes Intravenous Every 24 hours 08/23/13 0626 08/23/13 1112   08/23/13 0930  fluconazole (DIFLUCAN) IVPB 200 mg  Status:  Discontinued     200 mg 100 mL/hr over 60 Minutes Intravenous Every 24 hours 08/23/13 0831 08/24/13 0834   08/21/13 1800  vancomycin (VANCOCIN) IVPB 750 mg/150 ml premix  Status:  Discontinued     750 mg 150 mL/hr over 60 Minutes Intravenous Every 12 hours 08/21/13 0749 08/23/13 0624   08/21/13 0830  vancomycin (VANCOCIN) 1,500 mg in sodium chloride 0.9 % 500 mL IVPB     1,500 mg 250 mL/hr over 120 Minutes Intravenous  Once 08/21/13 0749 08/21/13 1030   08/21/13 0745  piperacillin-tazobactam (ZOSYN) IVPB 3.375 g     3.375 g 12.5 mL/hr over 240 Minutes Intravenous 3 times per day 08/21/13 0740     08/15/13 2200  ceFAZolin (ANCEF) IVPB 1 g/50 mL premix  Status:  Discontinued     1 g 100 mL/hr over 30 Minutes Intravenous 3 times per day 08/15/13 1202 08/21/13 0746   08/15/13 1300  ceFAZolin (ANCEF) IVPB 2 g/50 mL premix     2 g 100 mL/hr over 30 Minutes Intravenous  Once 08/15/13 1202 08/15/13 1449      Best Practice/Protocols:  VTE Prophylaxis: Mechanical GI Prophylaxis: Proton Pump Inhibitor Continous Sedation weaning off sedation  Consults: Treatment Team:  Consuella Lose, MD    Events:  Subjective:    Overnight Issues: Patient weaned on the ventilator yesterday for 11  hours.  Not extubated.  Objective:  Vital signs for last 24 hours: Temp:  [98.6 F (37 C)-101.5 F (38.6 C)] 98.6 F (37 C) (08/03 0746) Pulse Rate:  [71-107] 84 (08/03 0700) Resp:  [14-29] 21 (08/03 0700) BP: (129-176)/(53-75) 149/66 mmHg (08/03 0700) SpO2:  [96 %-100 %] 99 % (08/03 0700) FiO2 (%):  [30 %] 30 % (08/03 0340) Weight:  [83.5 kg (184 lb 1.4 oz)] 83.5 kg (184 lb 1.4 oz) (08/03 0500)  Hemodynamic parameters for last 24 hours:    Intake/Output from previous day: 08/02 0701 - 08/03 0700 In: 2459.1 [I.V.:1999.1; NG/GT:210; IV Piggyback:250] Out: 5800 [Urine:5600; Stool:200]  Intake/Output this shift: Total I/O In: 35 [NG/GT:35] Out: 325 [Urine:325]  Vent settings for last 24 hours: Vent Mode:  [-] PRVC FiO2 (%):  [30 %] 30 %  Set Rate:  [16 bmp] 16 bmp Vt Set:  [600 mL] 600 mL PEEP:  [5 cmH20] 5 cmH20 Pressure Support:  [5 cmH20-15 cmH20] 15 cmH20 Plateau Pressure:  [15 cmH20-17 cmH20] 17 cmH20  Physical Exam:  General: alert and no respiratory distress Neuro: alert, oriented, RASS 0, weakness left upper extremity and weakness left lower extremity Resp: clear to auscultation bilaterally GI: soft, nontender, BS WNL, no r/g and had been tolerating tube feedings well. Extremities: no edema, no erythema, pulses WNL and SCDs in place  Results for orders placed during the hospital encounter of 08/15/13 (from the past 24 hour(s))  GLUCOSE, CAPILLARY     Status: Abnormal   Collection Time    08/24/13  8:05 AM      Result Value Ref Range   Glucose-Capillary 133 (*) 70 - 99 mg/dL   Comment 1 Notify RN     Comment 2 Documented in Chart    TRIGLYCERIDES     Status: Abnormal   Collection Time    08/24/13 11:13 AM      Result Value Ref Range   Triglycerides 161 (*) <150 mg/dL  GLUCOSE, CAPILLARY     Status: Abnormal   Collection Time    08/24/13 11:58 AM      Result Value Ref Range   Glucose-Capillary 156 (*) 70 - 99 mg/dL   Comment 1 Notify RN     Comment 2  Documented in Chart    GLUCOSE, CAPILLARY     Status: Abnormal   Collection Time    08/24/13  4:09 PM      Result Value Ref Range   Glucose-Capillary 147 (*) 70 - 99 mg/dL   Comment 1 Notify RN     Comment 2 Documented in Chart    GLUCOSE, CAPILLARY     Status: Abnormal   Collection Time    08/24/13  8:58 PM      Result Value Ref Range   Glucose-Capillary 131 (*) 70 - 99 mg/dL  GLUCOSE, CAPILLARY     Status: Abnormal   Collection Time    08/25/13 12:11 AM      Result Value Ref Range   Glucose-Capillary 153 (*) 70 - 99 mg/dL  GLUCOSE, CAPILLARY     Status: Abnormal   Collection Time    08/25/13  3:52 AM      Result Value Ref Range   Glucose-Capillary 148 (*) 70 - 99 mg/dL  CBC     Status: Abnormal   Collection Time    08/25/13  5:55 AM      Result Value Ref Range   WBC 13.2 (*) 4.0 - 10.5 K/uL   RBC 2.92 (*) 4.22 - 5.81 MIL/uL   Hemoglobin 9.0 (*) 13.0 - 17.0 g/dL   HCT 27.4 (*) 39.0 - 52.0 %   MCV 93.8  78.0 - 100.0 fL   MCH 30.8  26.0 - 34.0 pg   MCHC 32.8  30.0 - 36.0 g/dL   RDW 13.8  11.5 - 15.5 %   Platelets 252  150 - 400 K/uL  BASIC METABOLIC PANEL     Status: Abnormal   Collection Time    08/25/13  5:55 AM      Result Value Ref Range   Sodium 143  137 - 147 mEq/L   Potassium 4.2  3.7 - 5.3 mEq/L   Chloride 110  96 - 112 mEq/L   CO2 22  19 - 32 mEq/L   Glucose, Bld 141 (*) 70 -  99 mg/dL   BUN 41 (*) 6 - 23 mg/dL   Creatinine, Ser 1.44 (*) 0.50 - 1.35 mg/dL   Calcium 8.7  8.4 - 10.5 mg/dL   GFR calc non Af Amer 45 (*) >90 mL/min   GFR calc Af Amer 52 (*) >90 mL/min   Anion gap 11  5 - 15  VANCOMYCIN, RANDOM     Status: None   Collection Time    08/25/13  5:55 AM      Result Value Ref Range   Vancomycin Rm 8.7    GLUCOSE, CAPILLARY     Status: Abnormal   Collection Time    08/25/13  7:27 AM      Result Value Ref Range   Glucose-Capillary 131 (*) 70 - 99 mg/dL     Assessment/Plan:   NEURO  Mentally seems to be okay.  Will open eyes for me and  follow commands.   Plan: CPM  PULM  Some mild basilar atelectasis   Plan: CPM until extubated, but plan on extubation today.  CARDIO  No specific issues   Plan: CPM  RENAL  No issues   Plan: CPM  GI  No issues   Plan: CPM, stop tube feedings for extubation soon.  ID  Proteus and Enterobacter PNA being treated.   Plan: CPM  HEME  Anemia anemia of critical illness)   Plan: No blood transfusion necessary  ENDO No specific issues   Plan: CPM  Global Issues  Patient seems ready for extubation.  Weaning well.  Now has a ETT leak which he did not have before because of swelling.  Treating PNA.  Extubate.    LOS: 10 days   Additional comments:I reviewed the patient's new clinical lab test results. cbc/bmet and I reviewed the patients new imaging test results. cxr yesterday  Critical Care Total Time*: 30 Minutes  Nyjae Hodge, Hilbert O 08/25/2013  *Care during the described time interval was provided by me and/or other providers on the critical care team.  I have reviewed this patient's available data, including medical history, events of note, physical examination and test results as part of my evaluation.

## 2013-08-25 NOTE — Progress Notes (Signed)
Received request for prescreen for inpatient rehab Noted that pt is still intubated and has low participation at this point. We will follow pt's case and consider possible rehab MD consult if pt's participation/activity tolerance improves. At this point, pt may be more appropriate for skilled nursing or LTAC.  Please contact an admission coordinator if pt's status/activity tolerance improves. Thank you.  Nanetta Batty, PT Rehabilitation Admissions Coordinator [phone]
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
## 2013-08-25 NOTE — Plan of Care (Signed)
Problem: Phase II Progression Outcomes Goal: ICP not requiring interventions Outcome: Completed/Met Date Met:  08/25/13 Ventric d/c 08/24/2013
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
## 2013-08-26 ENCOUNTER — Inpatient Hospital Stay (HOSPITAL_COMMUNITY): Payer: Medicare Other

## 2013-08-26 LAB — GLUCOSE, CAPILLARY
Glucose-Capillary: 103 mg/dL — ABNORMAL HIGH (ref 70–99)
Glucose-Capillary: 104 mg/dL — ABNORMAL HIGH (ref 70–99)
Glucose-Capillary: 105 mg/dL — ABNORMAL HIGH (ref 70–99)
Glucose-Capillary: 109 mg/dL — ABNORMAL HIGH (ref 70–99)
Glucose-Capillary: 142 mg/dL — ABNORMAL HIGH (ref 70–99)
Glucose-Capillary: 99 mg/dL (ref 70–99)
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
## 2013-08-26 NOTE — Progress Notes (Signed)
UR completed.  Therapies recommending CIR at d/c.   Sandi Mariscal, RN BSN [place] CCM Trauma/Neuro ICU Case Manager [phone]
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
## 2013-08-27 LAB — BASIC METABOLIC PANEL
Anion gap: 12 (ref 5–15)
BUN: 31 mg/dL — ABNORMAL HIGH (ref 6–23)
CO2: 22 mEq/L (ref 19–32)
Calcium: 8.7 mg/dL (ref 8.4–10.5)
Chloride: 108 mEq/L (ref 96–112)
Creatinine, Ser: 1.02 mg/dL (ref 0.50–1.35)
GFR calc Af Amer: 79 mL/min — ABNORMAL LOW (ref >90)
GFR calc non Af Amer: 68 mL/min — ABNORMAL LOW (ref >90)
Glucose, Bld: 128 mg/dL — ABNORMAL HIGH (ref 70–99)
Potassium: 4 mEq/L (ref 3.7–5.3)
Sodium: 142 mEq/L (ref 137–147)

## 2013-08-27 LAB — CULTURE, BLOOD (ROUTINE X 2)
Culture: NO GROWTH
Culture: NO GROWTH

## 2013-08-27 LAB — GLUCOSE, CAPILLARY
Glucose-Capillary: 124 mg/dL — ABNORMAL HIGH (ref 70–99)
Glucose-Capillary: 125 mg/dL — ABNORMAL HIGH (ref 70–99)
Glucose-Capillary: 131 mg/dL — ABNORMAL HIGH (ref 70–99)
Glucose-Capillary: 139 mg/dL — ABNORMAL HIGH (ref 70–99)
Glucose-Capillary: 144 mg/dL — ABNORMAL HIGH (ref 70–99)

## 2013-08-27 LAB — CBC
HCT: 30.3 % — ABNORMAL LOW (ref 39.0–52.0)
Hemoglobin: 9.9 g/dL — ABNORMAL LOW (ref 13.0–17.0)
MCH: 31.1 pg (ref 26.0–34.0)
MCHC: 32.7 g/dL (ref 30.0–36.0)
MCV: 95.3 fL (ref 78.0–100.0)
Platelets: 337 10*3/uL (ref 150–400)
RBC: 3.18 MIL/uL — ABNORMAL LOW (ref 4.22–5.81)
RDW: 13.1 % (ref 11.5–15.5)
WBC: 14.8 10*3/uL — ABNORMAL HIGH (ref 4.0–10.5)

## 2013-08-27 MED ORDER — PIVOT 1.5 CAL PO LIQD
1000.0000 mL | ORAL | Status: DC
  Administered 2013-08-27 – 2013-09-01 (×7): 1000 mL
  Filled 2013-08-27 (×12): qty 1000

## 2013-08-27 MED ORDER — LEVOFLOXACIN IN D5W 750 MG/150ML IV SOLN
750.0000 mg | INTRAVENOUS | Status: DC
  Administered 2013-08-28 – 2013-09-02 (×6): 750 mg via INTRAVENOUS
  Filled 2013-08-27 (×7): qty 150
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
## 2013-08-28 LAB — GLUCOSE, CAPILLARY
Glucose-Capillary: 122 mg/dL — ABNORMAL HIGH (ref 70–99)
Glucose-Capillary: 126 mg/dL — ABNORMAL HIGH (ref 70–99)
Glucose-Capillary: 131 mg/dL — ABNORMAL HIGH (ref 70–99)
Glucose-Capillary: 132 mg/dL — ABNORMAL HIGH (ref 70–99)
Glucose-Capillary: 141 mg/dL — ABNORMAL HIGH (ref 70–99)
Glucose-Capillary: 149 mg/dL — ABNORMAL HIGH (ref 70–99)

## 2013-08-28 NOTE — Progress Notes (Signed)
Orthopedic Tech Progress Note Patient Details:  EVANS LEVEE 1932-05-12 [id number] Advanced called for order Patient ID: [name], male   DOB: 05/13/1932, 80 y.o.   MRN: [id number]   Fenton Foy 08/28/2013, 1:59 PM
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
## 2013-08-29 ENCOUNTER — Inpatient Hospital Stay (HOSPITAL_COMMUNITY): Payer: Medicare Other

## 2013-08-29 LAB — GLUCOSE, CAPILLARY
Glucose-Capillary: 133 mg/dL — ABNORMAL HIGH (ref 70–99)
Glucose-Capillary: 138 mg/dL — ABNORMAL HIGH (ref 70–99)
Glucose-Capillary: 130 mg/dL — ABNORMAL HIGH (ref 70–99)
Glucose-Capillary: 143 mg/dL — ABNORMAL HIGH (ref 70–99)
Glucose-Capillary: 135 mg/dL — ABNORMAL HIGH (ref 70–99)
Glucose-Capillary: 138 mg/dL — ABNORMAL HIGH (ref 70–99)

## 2013-08-29 LAB — CBC
HCT: 29.8 % — ABNORMAL LOW (ref 39.0–52.0)
Hemoglobin: 9.9 g/dL — ABNORMAL LOW (ref 13.0–17.0)
MCH: 31.1 pg (ref 26.0–34.0)
MCHC: 33.2 g/dL (ref 30.0–36.0)
MCV: 93.7 fL (ref 78.0–100.0)
Platelets: 366 10*3/uL (ref 150–400)
RBC: 3.18 MIL/uL — ABNORMAL LOW (ref 4.22–5.81)
RDW: 13.4 % (ref 11.5–15.5)
WBC: 13.6 10*3/uL — ABNORMAL HIGH (ref 4.0–10.5)

## 2013-08-29 NOTE — Progress Notes (Signed)
Pt arrived to unit.  VSS upon admission, does not appear to be in any acute distress.  Assessment done.  No family present at bedside.  Will continue to monitor.   Cori Razor, RN 08/29/2013 4:59 PM
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
## 2013-08-29 NOTE — Progress Notes (Signed)
Trauma Service Note  Subjective: Patient opens eyes to verbal stimulus.  Does not appear to be in any acute distress.  Objective: Vital signs in last 24 hours: Temp:  [98.4 F (36.9 C)-100.4 F (38 C)] 98.8 F (37.1 C) (08/07 0803) Pulse Rate:  [73-86] 73 (08/07 0803) Resp:  [16-39] 21 (08/07 0803) BP: (135-160)/(61-85) 135/70 mmHg (08/07 0803) SpO2:  [94 %-100 %] 98 % (08/07 0803) Weight:  [78.4 kg (172 lb 13.5 oz)] 78.4 kg (172 lb 13.5 oz) (08/07 0423) Last BM Date: 08/29/13  Intake/Output from previous day: 08/06 0701 - 08/07 0700 In: 3400 [I.V.:1975; NG/GT:1100; IV Piggyback:150] Out: 2790 [Urine:2490; Stool:300] Intake/Output this shift: Total I/O In: 175 [I.V.:75; NG/GT:100] Out: 200 [Urine:200]  General: No acute distress.  Will follow commands for others.  Lungs: Clear   Abd: Soft, benign  Extremities: No DVT signs or symptoms  Neuro: left sided weakness  Lab Results: CBC   Recent Labs  08/27/13 0600 08/29/13 0036  WBC 14.8* 13.6*  HGB 9.9* 9.9*  HCT 30.3* 29.8*  PLT 337 366   BMET  Recent Labs  08/27/13 0600  NA 142  K 4.0  CL 108  CO2 22  GLUCOSE 128*  BUN 31*  CREATININE 1.02  CALCIUM 8.7   PT/INR No results found for this basename: LABPROT, INR,  in the last 72 hours ABG No results found for this basename: PHART, PCO2, PO2, HCO3,  in the last 72 hours  Studies/Results: Dg Chest Port 1 View  08/29/2013   CLINICAL DATA:  Followup atelectasis.  Increasing white count.  EXAM: PORTABLE CHEST - 1 VIEW  COMPARISON:  08/24/2013.  FINDINGS: Interval tracheal extubation. There is a feeding tube which at least enters the stomach. Right upper extremity PICC, tip at the distal SVC.  Unchanged borderline cardiomegaly. Improved basilar aeration. No evidence of pneumonia, edema, effusion, or pneumothorax.  IMPRESSION: Improved pulmonary inflation.  No evidence for pneumonia.   Electronically Signed   By: Jorje Guild M.D.   On: 08/29/2013 06:43     Anti-infectives: Anti-infectives   Start     Dose/Rate Route Frequency Ordered Stop   08/28/13 1000  levofloxacin (LEVAQUIN) IVPB 750 mg     750 mg 100 mL/hr over 90 Minutes Intravenous Every 24 hours 08/27/13 1450     08/25/13 1000  levofloxacin (LEVAQUIN) IVPB 750 mg  Status:  Discontinued     750 mg 100 mL/hr over 90 Minutes Intravenous Every 48 hours 08/25/13 0806 08/27/13 1450   08/25/13 0715  vancomycin (VANCOCIN) IVPB 750 mg/150 ml premix  Status:  Discontinued     750 mg 150 mL/hr over 60 Minutes Intravenous  Once 08/25/13 0709 08/25/13 0806   08/24/13 0900  fluconazole (DIFLUCAN) IVPB 200 mg  Status:  Discontinued     200 mg 100 mL/hr over 60 Minutes Intravenous Every 24 hours 08/24/13 0835 08/26/13 0755   08/23/13 2000  vancomycin (VANCOCIN) 1,250 mg in sodium chloride 0.9 % 250 mL IVPB  Status:  Discontinued     1,250 mg 166.7 mL/hr over 90 Minutes Intravenous Every 24 hours 08/23/13 0626 08/23/13 1112   08/23/13 0930  fluconazole (DIFLUCAN) IVPB 200 mg  Status:  Discontinued     200 mg 100 mL/hr over 60 Minutes Intravenous Every 24 hours 08/23/13 0831 08/24/13 0834   08/21/13 1800  vancomycin (VANCOCIN) IVPB 750 mg/150 ml premix  Status:  Discontinued     750 mg 150 mL/hr over 60 Minutes Intravenous Every 12 hours 08/21/13  1771 08/23/13 0624   08/21/13 0830  vancomycin (VANCOCIN) 1,500 mg in sodium chloride 0.9 % 500 mL IVPB     1,500 mg 250 mL/hr over 120 Minutes Intravenous  Once 08/21/13 0749 08/21/13 1030   08/21/13 0745  piperacillin-tazobactam (ZOSYN) IVPB 3.375 g  Status:  Discontinued     3.375 g 12.5 mL/hr over 240 Minutes Intravenous 3 times per day 08/21/13 0740 08/25/13 0806   08/15/13 2200  ceFAZolin (ANCEF) IVPB 1 g/50 mL premix  Status:  Discontinued     1 g 100 mL/hr over 30 Minutes Intravenous 3 times per day 08/15/13 1202 08/21/13 0746   08/15/13 1300  ceFAZolin (ANCEF) IVPB 2 g/50 mL premix     2 g 100 mL/hr over 30 Minutes Intravenous  Once  08/15/13 1202 08/15/13 1449      Assessment/Plan: s/p Procedure(s): ESOPHAGOGASTRODUODENOSCOPY (EGD) Transfer to the floor. Continue to support with therapy.  LOS: 14 days   Kathryne Eriksson. Dahlia Bailiff, MD, FACS [phone] Trauma Surgeon 08/29/2013
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
## 2013-08-30 DIAGNOSIS — D62 Acute posthemorrhagic anemia: Secondary | ICD-10-CM

## 2013-08-30 DIAGNOSIS — R1310 Dysphagia, unspecified: Secondary | ICD-10-CM

## 2013-08-30 LAB — GLUCOSE, CAPILLARY
Glucose-Capillary: 136 mg/dL — ABNORMAL HIGH (ref 70–99)
Glucose-Capillary: 136 mg/dL — ABNORMAL HIGH (ref 70–99)
Glucose-Capillary: 136 mg/dL — ABNORMAL HIGH (ref 70–99)
Glucose-Capillary: 140 mg/dL — ABNORMAL HIGH (ref 70–99)
Glucose-Capillary: 144 mg/dL — ABNORMAL HIGH (ref 70–99)
Glucose-Capillary: 148 mg/dL — ABNORMAL HIGH (ref 70–99)

## 2013-08-30 MED ORDER — PNEUMOCOCCAL VAC POLYVALENT 25 MCG/0.5ML IJ INJ: 0.5000 mL | INJECTION | INTRAMUSCULAR | Status: DC
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
## 2013-08-30 NOTE — Progress Notes (Signed)
Patient ID: Austin [name], male   DOB: 10/07/1932, 80 y.o.   MRN: [id number] Afeb, vss No new neuro issues. Neuro exam remains stable from yesterday. Continue present rx at this time!

## 2013-08-31 LAB — GLUCOSE, CAPILLARY
Glucose-Capillary: 136 mg/dL — ABNORMAL HIGH (ref 70–99)
Glucose-Capillary: 142 mg/dL — ABNORMAL HIGH (ref 70–99)
Glucose-Capillary: 150 mg/dL — ABNORMAL HIGH (ref 70–99)
Glucose-Capillary: 153 mg/dL — ABNORMAL HIGH (ref 70–99)
Glucose-Capillary: 158 mg/dL — ABNORMAL HIGH (ref 70–99)
Glucose-Capillary: 158 mg/dL — ABNORMAL HIGH (ref 70–99)

## 2013-08-31 LAB — CBC
HCT: 32.5 % — ABNORMAL LOW (ref 39.0–52.0)
Hemoglobin: 10.6 g/dL — ABNORMAL LOW (ref 13.0–17.0)
MCH: 31.2 pg (ref 26.0–34.0)
MCHC: 32.6 g/dL (ref 30.0–36.0)
MCV: 95.6 fL (ref 78.0–100.0)
Platelets: 350 10*3/uL (ref 150–400)
RBC: 3.4 MIL/uL — ABNORMAL LOW (ref 4.22–5.81)
RDW: 13.5 % (ref 11.5–15.5)
WBC: 12.1 10*3/uL — ABNORMAL HIGH (ref 4.0–10.5)

## 2013-08-31 MED ORDER — METOPROLOL TARTRATE 25 MG/10 ML ORAL SUSPENSION
12.5000 mg | Freq: Two times a day (BID) | ORAL | Status: DC
  Administered 2013-08-31 – 2013-09-03 (×6): 12.5 mg
  Filled 2013-08-31 (×8): qty 5

## 2013-08-31 MED ORDER — JEVITY 1.2 CAL PO LIQD
ORAL | Status: AC
  Filled 2013-08-31: qty 237

## 2013-08-31 MED ORDER — FREE WATER
200.0000 mL | Freq: Three times a day (TID) | Status: DC
  Administered 2013-08-31 – 2013-09-03 (×9): 200 mL
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
## 2013-08-31 NOTE — Progress Notes (Signed)
Pt urine blood tinged. Catheter pulled tight. Leg strap was in place. Catheter readjusted. MD notified. No new orders given. Will continue to monitor.
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
## 2013-08-31 NOTE — Progress Notes (Signed)
Not sure if he will need the PEG in the future for placement.  This patient has been seen and I agree with the findings and treatment plan.  Kathryne Eriksson. Dahlia Bailiff, MD, [place] [phone] (pager) [phone] (direct pager) Trauma Surgeon

## 2013-09-01 DIAGNOSIS — S069X9A Unspecified intracranial injury with loss of consciousness of unspecified duration, initial encounter: Secondary | ICD-10-CM

## 2013-09-01 DIAGNOSIS — S069XAA Unspecified intracranial injury with loss of consciousness status unknown, initial encounter: Secondary | ICD-10-CM

## 2013-09-01 LAB — URINALYSIS, ROUTINE W REFLEX MICROSCOPIC
Bilirubin Urine: NEGATIVE
Glucose, UA: NEGATIVE mg/dL
Ketones, ur: NEGATIVE mg/dL
Nitrite: NEGATIVE
Protein, ur: 30 mg/dL — AB
Specific Gravity, Urine: 1.025 (ref 1.005–1.030)
Urobilinogen, UA: 0.2 mg/dL (ref 0.0–1.0)
pH: 5 (ref 5.0–8.0)

## 2013-09-01 LAB — URINE MICROSCOPIC-ADD ON

## 2013-09-01 LAB — GLUCOSE, CAPILLARY
Glucose-Capillary: 134 mg/dL — ABNORMAL HIGH (ref 70–99)
Glucose-Capillary: 142 mg/dL — ABNORMAL HIGH (ref 70–99)
Glucose-Capillary: 144 mg/dL — ABNORMAL HIGH (ref 70–99)
Glucose-Capillary: 158 mg/dL — ABNORMAL HIGH (ref 70–99)
Glucose-Capillary: 171 mg/dL — ABNORMAL HIGH (ref 70–99)
Glucose-Capillary: 171 mg/dL — ABNORMAL HIGH (ref 70–99)

## 2013-09-01 MED ORDER — AMANTADINE HCL 50 MG/5ML PO SYRP
100.0000 mg | ORAL_SOLUTION | Freq: Two times a day (BID) | ORAL | Status: DC
  Administered 2013-09-02: 100 mg
  Filled 2013-09-01 (×7): qty 10
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
## 2013-09-01 NOTE — Progress Notes (Signed)
PEG scheduled for tomorrow. No family here yet today. He was able to whisper good morning to me today - a first. Plan CIR vs SNF. Patient examined and I agree with the assessment and plan  Georganna Skeans, MD, MPH, FACS Trauma: [phone] General Surgery: [phone]  09/01/2013 9:50 AM
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
## 2013-09-01 NOTE — Progress Notes (Signed)
Patient ID: Austin [name], male   DOB: February 03, 1932, 80 y.o.   MRN: [id number] I spoke with his wife in person and his granddaughter by phone regarding EGD/PEG placement planned for tomorrow. I went over the procedure, risks, and benefits. I answered their questions. They agree. Additionally, his urine appears blood tinged. I ordered U/A with micro. Georganna Skeans, MD, MPH, FACS Trauma: [phone] General Surgery: [phone]
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
## 2013-09-02 ENCOUNTER — Encounter (HOSPITAL_COMMUNITY): Payer: Medicare Other | Admitting: Certified Registered Nurse Anesthetist

## 2013-09-02 ENCOUNTER — Encounter (HOSPITAL_COMMUNITY): Payer: Self-pay

## 2013-09-02 ENCOUNTER — Encounter (HOSPITAL_COMMUNITY): Admission: EM | Disposition: A | Discharge status: Rehabilitation Facility | Payer: Self-pay | Source: Home / Self Care

## 2013-09-02 ENCOUNTER — Inpatient Hospital Stay (HOSPITAL_COMMUNITY): Payer: Medicare Other | Admitting: Certified Registered Nurse Anesthetist

## 2013-09-02 ENCOUNTER — Ambulatory Visit (HOSPITAL_COMMUNITY): Admit: 2013-09-02 | Payer: Self-pay | Admitting: General Surgery

## 2013-09-02 ENCOUNTER — Encounter (HOSPITAL_COMMUNITY): Payer: Self-pay | Admitting: *Deleted

## 2013-09-02 HISTORY — PX: PEG PLACEMENT: SHX5437

## 2013-09-02 LAB — GLUCOSE, CAPILLARY
Glucose-Capillary: 104 mg/dL — ABNORMAL HIGH (ref 70–99)
Glucose-Capillary: 109 mg/dL — ABNORMAL HIGH (ref 70–99)
Glucose-Capillary: 117 mg/dL — ABNORMAL HIGH (ref 70–99)
Glucose-Capillary: 119 mg/dL — ABNORMAL HIGH (ref 70–99)
Glucose-Capillary: 122 mg/dL — ABNORMAL HIGH (ref 70–99)
Glucose-Capillary: 142 mg/dL — ABNORMAL HIGH (ref 70–99)

## 2013-09-02 SURGERY — INSERTION, PEG TUBE
Anesthesia: Monitor Anesthesia Care

## 2013-09-02 MED ORDER — ENOXAPARIN SODIUM 40 MG/0.4ML ~~LOC~~ SOLN
40.0000 mg | SUBCUTANEOUS | Status: DC
  Administered 2013-09-02 – 2013-09-03 (×2): 40 mg via SUBCUTANEOUS
  Filled 2013-09-02 (×2): qty 0.4

## 2013-09-02 MED ORDER — SODIUM CHLORIDE 0.9 % IV SOLN
INTRAVENOUS | Status: DC | PRN
  Administered 2013-09-02: 09:00:00 via INTRAVENOUS

## 2013-09-02 MED ORDER — PROPOFOL 10 MG/ML IV BOLUS
INTRAVENOUS | Status: DC | PRN
  Administered 2013-09-02: 30 mg via INTRAVENOUS

## 2013-09-02 MED ORDER — PROPOFOL INFUSION 10 MG/ML OPTIME
INTRAVENOUS | Status: DC | PRN
  Administered 2013-09-02: 100 ug/kg/min via INTRAVENOUS

## 2013-09-02 MED ORDER — SODIUM CHLORIDE 0.9 % IV SOLN
Freq: Once | INTRAVENOUS | Status: AC
  Administered 2013-09-02: 500 mL via INTRAVENOUS
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
## 2013-09-02 NOTE — Progress Notes (Signed)
Right hand/digits not swollen, can make composite fist and release to request Wounds at wrist clean, no evidence for infection, healing well Continue removeable velcro splint. WBAT R UE I will order xrays of the hand about 1 month after injury  If questions related to right wrist, please call:  [facility] [phone]

## 2013-09-02 NOTE — Transfer of Care (Signed)
Immediate Anesthesia Transfer of Care Note  Patient: Austin [name]  Procedure(s) Performed: Procedure(s): PERCUTANEOUS ENDOSCOPIC GASTROSTOMY (PEG) PLACEMENT (N/A)  Patient Location: Endoscopy Unit  Anesthesia Type:MAC  Level of Consciousness: awake and alert   Airway & Oxygen Therapy: Patient Spontanous Breathing and Patient connected to nasal cannula oxygen  Post-op Assessment: Report given to PACU RN and Post -op Vital signs reviewed and stable  Post vital signs: Reviewed and stable  Complications: No apparent anesthesia complications

## 2013-09-02 NOTE — Progress Notes (Signed)
Pt left floor per bed with ir staff at 0815.
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
## 2013-09-02 NOTE — Progress Notes (Signed)
Returned from endo in no obvious distress. Peg in place, drain sponge in place no drainage, binder placed on abdomen. meds admin per tube with no complication, flushed with no difficulty.
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
## 2013-09-02 NOTE — Interval H&P Note (Signed)
History and Physical Interval Note: Patient not able to take PO.  PEG being placed for continue nutrition and placement. 09/02/2013 9:01 AM  Austin [name]  has presented today for surgery, with the diagnosis of dysphagia  The various methods of treatment have been discussed with the patient and family. After consideration of risks, benefits and other options for treatment, the patient has consented to  Procedure(s): PERCUTANEOUS ENDOSCOPIC GASTROSTOMY (PEG) PLACEMENT (N/A) as a surgical intervention .  The patient's history has been reviewed, patient examined, no change in status, stable for surgery.  I have reviewed the patient's chart and labs.  Questions were answered to the patient's satisfaction.     Joshawn Crissman, Kathryne Eriksson

## 2013-09-02 NOTE — Progress Notes (Signed)
Tube feedings stopped at midnight. Pt continues to have red colored urine. Will continue to monitor.
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
## 2013-09-02 NOTE — Anesthesia Procedure Notes (Signed)
Procedure Name: MAC Date/Time: 09/02/2013 9:08 AM Performed by: Trixie Deis A Pre-anesthesia Checklist: Patient identified, Emergency Drugs available, Timeout performed, Suction available and Patient being monitored Patient Re-evaluated:Patient Re-evaluated prior to inductionOxygen Delivery Method: Nasal cannula Placement Confirmation: positive ETCO2
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
## 2013-09-02 NOTE — Op Note (Addendum)
OPERATIVE REPORT  DATE OF OPERATION: 09/02/2013  PATIENT:  Austin [name]  80 y.o. male  PRE-OPERATIVE DIAGNOSIS:  dysphagia  POST-OPERATIVE DIAGNOSIS: Same  PROCEDURE:  Procedure(s): PERCUTANEOUS ENDOSCOPIC GASTROSTOMY (PEG) PLACEMENT  SURGEON:  Surgeon(s): Gwenyth Ober, MD  ASSISTANT: Jacqulynn Cadet, PA-C  ANESTHESIA:   IV sedation and MAC  EBL: <10 ml  BLOOD ADMINISTERED: none  DRAINS: Gastrostomy Tube   SPECIMEN:  No Specimen  COUNTS CORRECT:  YES  PROCEDURE DETAILS: The patient was taken to the endoscopy suite where subsequently a proper timeout was performed identifying the patient and the procedure to be performed. Abdominal wall was prepped in the usual sterile manner.  The [employer] [id number] Endoscope was passed through the patient's oropharynx into the stomach or esophagus following the tract of his: Soft feeding tube. We subsequently passed down through the esophagus into the stomach where the stomach was insufflated with gas distended up to the anterior abdominal wall.  As noted before there was no evidence of injury to the cervical esophagus or the esophagus in the chest.  The endoscope down to the pylorus however could not cannulate the pylorus multiple attempts and therefore the endoscope was retracted back into the body of the stomach.  P.m. Pulse with the assistance finger on the anterior abdominal wall was noted. It was at that site the gastrostomy tube was placed. An incision was made and subsequently an angiocatheter passed through the incision into the stomach under direct vision. Subsequently a loop blue we was passed through the Angiocath into the stomach and brought out through the patient's mouth using a snare passed through the endoscope.  The Looped blue wire was passed around the pull-through gastrostomy tube was subsequently pulled out to the anterior abdominal wall. Pictures of a gastrostomy tube in proper position are attached to this note.  The  gastrostomy tube was subsequently sutured in place in usual sterile manner. All counts were correct.      PATIENT DISPOSITION:  PACU - hemodynamically stable.   DEMARYIUS, IMRAN 8/11/20159:28 AM

## 2013-09-02 NOTE — H&P (View-Only) (Signed)
Trauma Service Note  Subjective: Patient opens eyes to verbal stimulus.  Does not appear to be in any acute distress.  Objective: Vital signs in last 24 hours: Temp:  [98.4 F (36.9 C)-100.4 F (38 C)] 98.8 F (37.1 C) (08/07 0803) Pulse Rate:  [73-86] 73 (08/07 0803) Resp:  [16-39] 21 (08/07 0803) BP: (135-160)/(61-85) 135/70 mmHg (08/07 0803) SpO2:  [94 %-100 %] 98 % (08/07 0803) Weight:  [78.4 kg (172 lb 13.5 oz)] 78.4 kg (172 lb 13.5 oz) (08/07 0423) Last BM Date: 08/29/13  Intake/Output from previous day: 08/06 0701 - 08/07 0700 In: 3400 [I.V.:1975; NG/GT:1100; IV Piggyback:150] Out: 2790 [Urine:2490; Stool:300] Intake/Output this shift: Total I/O In: 175 [I.V.:75; NG/GT:100] Out: 200 [Urine:200]  General: No acute distress.  Will follow commands for others.  Lungs: Clear   Abd: Soft, benign  Extremities: No DVT signs or symptoms  Neuro: left sided weakness  Lab Results: CBC   Recent Labs  08/27/13 0600 08/29/13 0036  WBC 14.8* 13.6*  HGB 9.9* 9.9*  HCT 30.3* 29.8*  PLT 337 366   BMET  Recent Labs  08/27/13 0600  NA 142  K 4.0  CL 108  CO2 22  GLUCOSE 128*  BUN 31*  CREATININE 1.02  CALCIUM 8.7   PT/INR No results found for this basename: LABPROT, INR,  in the last 72 hours ABG No results found for this basename: PHART, PCO2, PO2, HCO3,  in the last 72 hours  Studies/Results: Dg Chest Port 1 View  08/29/2013   CLINICAL DATA:  Followup atelectasis.  Increasing white count.  EXAM: PORTABLE CHEST - 1 VIEW  COMPARISON:  08/24/2013.  FINDINGS: Interval tracheal extubation. There is a feeding tube which at least enters the stomach. Right upper extremity PICC, tip at the distal SVC.  Unchanged borderline cardiomegaly. Improved basilar aeration. No evidence of pneumonia, edema, effusion, or pneumothorax.  IMPRESSION: Improved pulmonary inflation.  No evidence for pneumonia.   Electronically Signed   By: Jorje Guild M.D.   On: 08/29/2013 06:43     Anti-infectives: Anti-infectives   Start     Dose/Rate Route Frequency Ordered Stop   08/28/13 1000  levofloxacin (LEVAQUIN) IVPB 750 mg     750 mg 100 mL/hr over 90 Minutes Intravenous Every 24 hours 08/27/13 1450     08/25/13 1000  levofloxacin (LEVAQUIN) IVPB 750 mg  Status:  Discontinued     750 mg 100 mL/hr over 90 Minutes Intravenous Every 48 hours 08/25/13 0806 08/27/13 1450   08/25/13 0715  vancomycin (VANCOCIN) IVPB 750 mg/150 ml premix  Status:  Discontinued     750 mg 150 mL/hr over 60 Minutes Intravenous  Once 08/25/13 0709 08/25/13 0806   08/24/13 0900  fluconazole (DIFLUCAN) IVPB 200 mg  Status:  Discontinued     200 mg 100 mL/hr over 60 Minutes Intravenous Every 24 hours 08/24/13 0835 08/26/13 0755   08/23/13 2000  vancomycin (VANCOCIN) 1,250 mg in sodium chloride 0.9 % 250 mL IVPB  Status:  Discontinued     1,250 mg 166.7 mL/hr over 90 Minutes Intravenous Every 24 hours 08/23/13 0626 08/23/13 1112   08/23/13 0930  fluconazole (DIFLUCAN) IVPB 200 mg  Status:  Discontinued     200 mg 100 mL/hr over 60 Minutes Intravenous Every 24 hours 08/23/13 0831 08/24/13 0834   08/21/13 1800  vancomycin (VANCOCIN) IVPB 750 mg/150 ml premix  Status:  Discontinued     750 mg 150 mL/hr over 60 Minutes Intravenous Every 12 hours 08/21/13  9861 08/23/13 0624   08/21/13 0830  vancomycin (VANCOCIN) 1,500 mg in sodium chloride 0.9 % 500 mL IVPB     1,500 mg 250 mL/hr over 120 Minutes Intravenous  Once 08/21/13 0749 08/21/13 1030   08/21/13 0745  piperacillin-tazobactam (ZOSYN) IVPB 3.375 g  Status:  Discontinued     3.375 g 12.5 mL/hr over 240 Minutes Intravenous 3 times per day 08/21/13 0740 08/25/13 0806   08/15/13 2200  ceFAZolin (ANCEF) IVPB 1 g/50 mL premix  Status:  Discontinued     1 g 100 mL/hr over 30 Minutes Intravenous 3 times per day 08/15/13 1202 08/21/13 0746   08/15/13 1300  ceFAZolin (ANCEF) IVPB 2 g/50 mL premix     2 g 100 mL/hr over 30 Minutes Intravenous  Once  08/15/13 1202 08/15/13 1449      Assessment/Plan: s/p Procedure(s): ESOPHAGOGASTRODUODENOSCOPY (EGD) Transfer to the floor. Continue to support with therapy.  LOS: 14 days   Kathryne Eriksson. Dahlia Bailiff, MD, FACS [phone] Trauma Surgeon 08/29/2013

## 2013-09-03 ENCOUNTER — Inpatient Hospital Stay (HOSPITAL_COMMUNITY)
Admission: RE | Admit: 2013-09-03 | Discharge: 2013-10-03 | DRG: 939 | Disposition: A | Discharge status: Home Health | Payer: Medicare Other | Source: Intra-hospital | Attending: Physical Medicine & Rehabilitation | Admitting: Physical Medicine & Rehabilitation

## 2013-09-03 ENCOUNTER — Encounter: Payer: Self-pay | Admitting: Internal Medicine

## 2013-09-03 ENCOUNTER — Encounter (HOSPITAL_COMMUNITY): Payer: Self-pay | Admitting: General Surgery

## 2013-09-03 DIAGNOSIS — D72829 Elevated white blood cell count, unspecified: Secondary | ICD-10-CM | POA: Diagnosis present

## 2013-09-03 DIAGNOSIS — S0190XA Unspecified open wound of unspecified part of head, initial encounter: Secondary | ICD-10-CM

## 2013-09-03 DIAGNOSIS — J690 Pneumonitis due to inhalation of food and vomit: Secondary | ICD-10-CM | POA: Diagnosis present

## 2013-09-03 DIAGNOSIS — G8110 Spastic hemiplegia affecting unspecified side: Secondary | ICD-10-CM

## 2013-09-03 DIAGNOSIS — S0291XS Unspecified fracture of skull, sequela: Secondary | ICD-10-CM

## 2013-09-03 DIAGNOSIS — I824Z2 Acute embolism and thrombosis of unspecified deep veins of left distal lower extremity: Secondary | ICD-10-CM

## 2013-09-03 DIAGNOSIS — Z5189 Encounter for other specified aftercare: Principal | ICD-10-CM

## 2013-09-03 DIAGNOSIS — R259 Unspecified abnormal involuntary movements: Secondary | ICD-10-CM | POA: Diagnosis not present

## 2013-09-03 DIAGNOSIS — Y249XXA Unspecified firearm discharge, undetermined intent, initial encounter: Secondary | ICD-10-CM | POA: Diagnosis not present

## 2013-09-03 DIAGNOSIS — J1569 Pneumonia due to other gram-negative bacteria: Secondary | ICD-10-CM

## 2013-09-03 DIAGNOSIS — R1310 Dysphagia, unspecified: Secondary | ICD-10-CM | POA: Diagnosis not present

## 2013-09-03 DIAGNOSIS — S0193XD Puncture wound without foreign body of unspecified part of head, subsequent encounter: Secondary | ICD-10-CM

## 2013-09-03 DIAGNOSIS — S0993XA Unspecified injury of face, initial encounter: Secondary | ICD-10-CM | POA: Diagnosis not present

## 2013-09-03 DIAGNOSIS — S0193XA Puncture wound without foreign body of unspecified part of head, initial encounter: Secondary | ICD-10-CM

## 2013-09-03 DIAGNOSIS — S0292XS Unspecified fracture of facial bones, sequela: Secondary | ICD-10-CM | POA: Diagnosis not present

## 2013-09-03 DIAGNOSIS — S069X9A Unspecified intracranial injury with loss of consciousness of unspecified duration, initial encounter: Secondary | ICD-10-CM | POA: Diagnosis present

## 2013-09-03 DIAGNOSIS — I82409 Acute embolism and thrombosis of unspecified deep veins of unspecified lower extremity: Secondary | ICD-10-CM

## 2013-09-03 DIAGNOSIS — IMO0002 Reserved for concepts with insufficient information to code with codable children: Secondary | ICD-10-CM | POA: Diagnosis not present

## 2013-09-03 DIAGNOSIS — E782 Mixed hyperlipidemia: Secondary | ICD-10-CM | POA: Diagnosis present

## 2013-09-03 DIAGNOSIS — M79609 Pain in unspecified limb: Secondary | ICD-10-CM | POA: Diagnosis not present

## 2013-09-03 DIAGNOSIS — Z87891 Personal history of nicotine dependence: Secondary | ICD-10-CM

## 2013-09-03 DIAGNOSIS — E7800 Pure hypercholesterolemia, unspecified: Secondary | ICD-10-CM | POA: Diagnosis not present

## 2013-09-03 DIAGNOSIS — S066XAA Traumatic subarachnoid hemorrhage with loss of consciousness status unknown, initial encounter: Secondary | ICD-10-CM | POA: Diagnosis not present

## 2013-09-03 DIAGNOSIS — R339 Retention of urine, unspecified: Secondary | ICD-10-CM | POA: Diagnosis not present

## 2013-09-03 DIAGNOSIS — S0183XS Puncture wound without foreign body of other part of head, sequela: Secondary | ICD-10-CM

## 2013-09-03 DIAGNOSIS — Z931 Gastrostomy status: Secondary | ICD-10-CM | POA: Diagnosis not present

## 2013-09-03 DIAGNOSIS — W3400XD Accidental discharge from unspecified firearms or gun, subsequent encounter: Secondary | ICD-10-CM

## 2013-09-03 DIAGNOSIS — R7309 Other abnormal glucose: Secondary | ICD-10-CM | POA: Diagnosis not present

## 2013-09-03 DIAGNOSIS — N179 Acute kidney failure, unspecified: Secondary | ICD-10-CM | POA: Diagnosis not present

## 2013-09-03 DIAGNOSIS — W3400XA Accidental discharge from unspecified firearms or gun, initial encounter: Secondary | ICD-10-CM

## 2013-09-03 DIAGNOSIS — I10 Essential (primary) hypertension: Secondary | ICD-10-CM | POA: Diagnosis not present

## 2013-09-03 DIAGNOSIS — S62309B Unspecified fracture of unspecified metacarpal bone, initial encounter for open fracture: Secondary | ICD-10-CM | POA: Diagnosis not present

## 2013-09-03 DIAGNOSIS — S069XAA Unspecified intracranial injury with loss of consciousness status unknown, initial encounter: Secondary | ICD-10-CM

## 2013-09-03 DIAGNOSIS — J156 Pneumonia due to other aerobic Gram-negative bacteria: Secondary | ICD-10-CM | POA: Diagnosis not present

## 2013-09-03 DIAGNOSIS — Z79899 Other long term (current) drug therapy: Secondary | ICD-10-CM | POA: Diagnosis not present

## 2013-09-03 DIAGNOSIS — S0291XA Unspecified fracture of skull, initial encounter for closed fracture: Secondary | ICD-10-CM | POA: Diagnosis present

## 2013-09-03 DIAGNOSIS — M7989 Other specified soft tissue disorders: Secondary | ICD-10-CM | POA: Diagnosis not present

## 2013-09-03 DIAGNOSIS — Z8546 Personal history of malignant neoplasm of prostate: Secondary | ICD-10-CM

## 2013-09-03 DIAGNOSIS — E871 Hypo-osmolality and hyponatremia: Secondary | ICD-10-CM | POA: Diagnosis not present

## 2013-09-03 DIAGNOSIS — J189 Pneumonia, unspecified organism: Secondary | ICD-10-CM | POA: Diagnosis present

## 2013-09-03 DIAGNOSIS — D62 Acute posthemorrhagic anemia: Secondary | ICD-10-CM | POA: Diagnosis not present

## 2013-09-03 DIAGNOSIS — W3400XS Accidental discharge from unspecified firearms or gun, sequela: Secondary | ICD-10-CM

## 2013-09-03 DIAGNOSIS — E785 Hyperlipidemia, unspecified: Secondary | ICD-10-CM | POA: Diagnosis present

## 2013-09-03 DIAGNOSIS — Z4682 Encounter for fitting and adjustment of non-vascular catheter: Secondary | ICD-10-CM | POA: Diagnosis not present

## 2013-09-03 DIAGNOSIS — S62306A Unspecified fracture of fifth metacarpal bone, right hand, initial encounter for closed fracture: Secondary | ICD-10-CM | POA: Diagnosis present

## 2013-09-03 DIAGNOSIS — S1193XA Puncture wound without foreign body of unspecified part of neck, initial encounter: Secondary | ICD-10-CM

## 2013-09-03 DIAGNOSIS — S51809A Unspecified open wound of unspecified forearm, initial encounter: Secondary | ICD-10-CM | POA: Diagnosis not present

## 2013-09-03 DIAGNOSIS — I824Z9 Acute embolism and thrombosis of unspecified deep veins of unspecified distal lower extremity: Secondary | ICD-10-CM | POA: Diagnosis not present

## 2013-09-03 DIAGNOSIS — S0183XD Puncture wound without foreign body of other part of head, subsequent encounter: Secondary | ICD-10-CM

## 2013-09-03 DIAGNOSIS — S0193XS Puncture wound without foreign body of unspecified part of head, sequela: Secondary | ICD-10-CM

## 2013-09-03 DIAGNOSIS — S61439A Puncture wound without foreign body of unspecified hand, initial encounter: Secondary | ICD-10-CM

## 2013-09-03 DIAGNOSIS — M542 Cervicalgia: Secondary | ICD-10-CM | POA: Diagnosis not present

## 2013-09-03 DIAGNOSIS — S066X9A Traumatic subarachnoid hemorrhage with loss of consciousness of unspecified duration, initial encounter: Secondary | ICD-10-CM

## 2013-09-03 LAB — GLUCOSE, CAPILLARY
Glucose-Capillary: 94 mg/dL (ref 70–99)
Glucose-Capillary: 118 mg/dL — ABNORMAL HIGH (ref 70–99)

## 2013-09-03 MED ORDER — OXYCODONE HCL 5 MG/5ML PO SOLN: 5.0000 mg | Freq: Once | ORAL | Status: DC | PRN

## 2013-09-03 MED ORDER — FLEET ENEMA 7-19 GM/118ML RE ENEM: 1.0000 | ENEMA | Freq: Once | RECTAL | Status: AC | PRN

## 2013-09-03 MED ORDER — CETYLPYRIDINIUM CHLORIDE 0.05 % MT LIQD
7.0000 mL | Freq: Four times a day (QID) | OROMUCOSAL | Status: DC
  Administered 2013-09-03 – 2013-10-03 (×96): 7 mL via OROMUCOSAL

## 2013-09-03 MED ORDER — AMANTADINE HCL 50 MG/5ML PO SYRP
100.0000 mg | ORAL_SOLUTION | Freq: Two times a day (BID) | ORAL | Status: DC
Start: 2013-09-03 — End: 2013-10-02
  Administered 2013-09-03 – 2013-10-02 (×57): 100 mg
  Filled 2013-09-03 (×61): qty 10

## 2013-09-03 MED ORDER — BISACODYL 10 MG RE SUPP: 10.0000 mg | Freq: Every day | RECTAL | Status: DC | PRN

## 2013-09-03 MED ORDER — FENTANYL CITRATE 0.05 MG/ML IJ SOLN: 25.0000 ug | INTRAMUSCULAR | Status: DC | PRN

## 2013-09-03 MED ORDER — TRAMADOL HCL 50 MG PO TABS: 50.0000 mg | ORAL_TABLET | Freq: Four times a day (QID) | ORAL | Status: DC | PRN

## 2013-09-03 MED ORDER — PIVOT 1.5 CAL PO LIQD
360.0000 mL | Freq: Four times a day (QID) | ORAL | Status: DC
  Administered 2013-09-03: 360 mL
  Administered 2013-09-04: 12:00:00
  Administered 2013-09-04 – 2013-09-07 (×12): 360 mL
  Administered 2013-09-07: 18:00:00
  Administered 2013-09-07 – 2013-09-30 (×73): 360 mL
  Filled 2013-09-03 (×124): qty 1000

## 2013-09-03 MED ORDER — ONDANSETRON HCL 4 MG/2ML IJ SOLN
4.0000 mg | Freq: Four times a day (QID) | INTRAMUSCULAR | Status: DC | PRN
  Filled 2013-09-03: qty 2

## 2013-09-03 MED ORDER — ONDANSETRON HCL 4 MG PO TABS: 4.0000 mg | ORAL_TABLET | Freq: Four times a day (QID) | ORAL | Status: DC | PRN

## 2013-09-03 MED ORDER — OXYCODONE HCL 5 MG PO TABS: 5.0000 mg | ORAL_TABLET | Freq: Once | ORAL | Status: DC | PRN

## 2013-09-03 MED ORDER — METOPROLOL TARTRATE 25 MG/10 ML ORAL SUSPENSION
12.5000 mg | Freq: Two times a day (BID) | ORAL | Status: DC
  Administered 2013-09-03 – 2013-10-02 (×57): 12.5 mg
  Filled 2013-09-03 (×64): qty 5

## 2013-09-03 MED ORDER — ACETAMINOPHEN 325 MG PO TABS: 325.0000 mg | ORAL_TABLET | ORAL | Status: DC | PRN

## 2013-09-03 MED ORDER — INSULIN ASPART 100 UNIT/ML ~~LOC~~ SOLN
0.0000 [IU] | Freq: Every day | SUBCUTANEOUS | Status: DC
  Administered 2013-09-06: 2 [IU] via SUBCUTANEOUS

## 2013-09-03 MED ORDER — ALUM & MAG HYDROXIDE-SIMETH 200-200-20 MG/5ML PO SUSP: 30.0000 mL | ORAL | Status: DC | PRN

## 2013-09-03 MED ORDER — PANTOPRAZOLE SODIUM 40 MG PO PACK
40.0000 mg | PACK | Freq: Every day | ORAL | Status: DC
  Administered 2013-09-04 – 2013-10-03 (×30): 40 mg
  Filled 2013-09-03 (×37): qty 20

## 2013-09-03 MED ORDER — ONDANSETRON HCL 4 MG/2ML IJ SOLN: 4.0000 mg | Freq: Four times a day (QID) | INTRAMUSCULAR | Status: DC | PRN

## 2013-09-03 MED ORDER — ENOXAPARIN SODIUM 40 MG/0.4ML ~~LOC~~ SOLN
40.0000 mg | SUBCUTANEOUS | Status: DC
  Administered 2013-09-04 – 2013-10-03 (×29): 40 mg via SUBCUTANEOUS
  Filled 2013-09-03 (×33): qty 0.4

## 2013-09-03 MED ORDER — ADULT MULTIVITAMIN LIQUID CH
5.0000 mL | Freq: Every day | ORAL | Status: DC
  Administered 2013-09-04 – 2013-10-03 (×29): 5 mL
  Filled 2013-09-03 (×35): qty 5

## 2013-09-03 MED ORDER — TRAMADOL HCL 50 MG PO TABS
50.0000 mg | ORAL_TABLET | Freq: Four times a day (QID) | ORAL | Status: DC | PRN
  Administered 2013-09-03 – 2013-09-21 (×5): 50 mg
  Filled 2013-09-03 (×5): qty 1

## 2013-09-03 MED ORDER — CHLORHEXIDINE GLUCONATE 0.12 % MT SOLN
15.0000 mL | Freq: Two times a day (BID) | OROMUCOSAL | Status: DC
  Administered 2013-09-03 – 2013-10-03 (×58): 15 mL via OROMUCOSAL
  Filled 2013-09-03 (×62): qty 15

## 2013-09-03 MED ORDER — LIDOCAINE HCL 2 % EX GEL
CUTANEOUS | Status: DC | PRN
  Filled 2013-09-03: qty 5

## 2013-09-03 MED ORDER — DIPHENHYDRAMINE HCL 12.5 MG/5ML PO ELIX: 12.5000 mg | ORAL_SOLUTION | Freq: Four times a day (QID) | ORAL | Status: DC | PRN

## 2013-09-03 MED ORDER — INSULIN ASPART 100 UNIT/ML ~~LOC~~ SOLN
0.0000 [IU] | Freq: Three times a day (TID) | SUBCUTANEOUS | Status: DC
  Administered 2013-09-03 – 2013-09-04 (×2): 2 [IU] via SUBCUTANEOUS
  Administered 2013-09-04 – 2013-09-05 (×2): 3 [IU] via SUBCUTANEOUS
  Administered 2013-09-06 (×2): 2 [IU] via SUBCUTANEOUS
  Administered 2013-09-06 – 2013-09-07 (×3): 3 [IU] via SUBCUTANEOUS
  Administered 2013-09-08 (×2): 2 [IU] via SUBCUTANEOUS
  Administered 2013-09-09: 3 [IU] via SUBCUTANEOUS
  Administered 2013-09-09 (×2): 2 [IU] via SUBCUTANEOUS
  Administered 2013-09-10: 3 [IU] via SUBCUTANEOUS
  Administered 2013-09-11: 2 [IU] via SUBCUTANEOUS
  Administered 2013-09-11 – 2013-09-13 (×4): 3 [IU] via SUBCUTANEOUS
  Administered 2013-09-13 – 2013-09-14 (×2): 2 [IU] via SUBCUTANEOUS
  Administered 2013-09-15 (×2): 3 [IU] via SUBCUTANEOUS
  Administered 2013-09-16 – 2013-09-17 (×2): 2 [IU] via SUBCUTANEOUS
  Administered 2013-09-18: 3 [IU] via SUBCUTANEOUS
  Administered 2013-09-19: 2 [IU] via SUBCUTANEOUS
  Administered 2013-09-20: 3 [IU] via SUBCUTANEOUS
  Administered 2013-09-21: 5 [IU] via SUBCUTANEOUS
  Administered 2013-09-21: 3 [IU] via SUBCUTANEOUS
  Administered 2013-09-21: 2 [IU] via SUBCUTANEOUS
  Administered 2013-09-23 – 2013-09-24 (×2): 3 [IU] via SUBCUTANEOUS
  Administered 2013-09-26: 2 [IU] via SUBCUTANEOUS

## 2013-09-03 MED ORDER — PIVOT 1.5 CAL PO LIQD
1000.0000 mL | ORAL | Status: DC
  Filled 2013-09-03 (×2): qty 1000

## 2013-09-03 MED ORDER — FREE WATER
150.0000 mL | Freq: Every day | Status: DC
  Administered 2013-09-03 – 2013-09-09 (×29): 150 mL

## 2013-09-03 MED ORDER — FREE WATER: 200.0000 mL | Freq: Every day | Status: DC

## 2013-09-03 MED ORDER — TRAMADOL 5 MG/ML ORAL SUSPENSION: 50.0000 mg | Freq: Four times a day (QID) | ORAL | Status: DC | PRN
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
## 2013-09-03 NOTE — Progress Notes (Signed)
Report given to ED from [facility] sent with patients. Family notify of disposition.   Ave Filter, RN
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
## 2013-09-03 NOTE — Anesthesia Postprocedure Evaluation (Signed)
Anesthesia Post Note  Patient: Austin [name]  Procedure(s) Performed: Procedure(s) (LRB): PERCUTANEOUS ENDOSCOPIC GASTROSTOMY (PEG) PLACEMENT (N/A)  Anesthesia type: MAC  Patient location: PACU  Post pain: Pain level controlled and Adequate analgesia  Post assessment: Post-op Vital signs reviewed, Patient's Cardiovascular Status Stable and Respiratory Function Stable  Last Vitals:  Filed Vitals:   09/03/13 1413  BP: 134/56  Pulse: 110  Temp: 36.9 C  Resp: 20    Post vital signs: Reviewed and stable  Level of consciousness: awake, alert  and oriented  Complications: No apparent anesthesia complications
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
## 2013-09-03 NOTE — Progress Notes (Signed)
Restarted tube feedings at 0500. Patient tolerating feedings well.

## 2013-09-03 NOTE — Progress Notes (Signed)
Responsive and appropriate.  Smiled a bit.  Hopefully can go to Rehab.\  This patient has been seen and I agree with the findings and treatment plan.  Kathryne Eriksson. Dahlia Bailiff, MD, [place] [phone] (pager) [phone] (direct pager) Trauma Surgeon

## 2013-09-03 NOTE — Progress Notes (Signed)
Rehab admissions - Admission paperwork completed with pt's wife and pt will be admitted to inpatient rehab later today. Pt's RN also aware. [name] a merged chart.  Please call me with any questions. Thanks.  Nanetta Batty, PT Rehabilitation Admissions Coordinator [phone]
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
## 2013-09-04 ENCOUNTER — Inpatient Hospital Stay (HOSPITAL_COMMUNITY): Payer: Medicare Other

## 2013-09-04 ENCOUNTER — Inpatient Hospital Stay (HOSPITAL_COMMUNITY): Payer: Self-pay

## 2013-09-04 ENCOUNTER — Inpatient Hospital Stay (HOSPITAL_COMMUNITY): Payer: Medicare Other | Admitting: *Deleted

## 2013-09-04 ENCOUNTER — Inpatient Hospital Stay (HOSPITAL_COMMUNITY): Payer: Medicare Other | Admitting: Speech Pathology

## 2013-09-04 DIAGNOSIS — S069X9A Unspecified intracranial injury with loss of consciousness of unspecified duration, initial encounter: Secondary | ICD-10-CM

## 2013-09-04 DIAGNOSIS — S069XAA Unspecified intracranial injury with loss of consciousness status unknown, initial encounter: Secondary | ICD-10-CM

## 2013-09-04 DIAGNOSIS — Z5189 Encounter for other specified aftercare: Secondary | ICD-10-CM

## 2013-09-04 LAB — URINALYSIS, ROUTINE W REFLEX MICROSCOPIC
Bilirubin Urine: NEGATIVE
Glucose, UA: NEGATIVE mg/dL
Hgb urine dipstick: NEGATIVE
Ketones, ur: NEGATIVE mg/dL
Leukocytes, UA: NEGATIVE
Nitrite: NEGATIVE
Protein, ur: NEGATIVE mg/dL
Specific Gravity, Urine: 1.025 (ref 1.005–1.030)
Urobilinogen, UA: 0.2 mg/dL (ref 0.0–1.0)
pH: 5 (ref 5.0–8.0)

## 2013-09-04 LAB — GLUCOSE, CAPILLARY
Glucose-Capillary: 136 mg/dL — ABNORMAL HIGH (ref 70–99)
Glucose-Capillary: 133 mg/dL — ABNORMAL HIGH (ref 70–99)
Glucose-Capillary: 134 mg/dL — ABNORMAL HIGH (ref 70–99)
Glucose-Capillary: 153 mg/dL — ABNORMAL HIGH (ref 70–99)
Glucose-Capillary: 114 mg/dL — ABNORMAL HIGH (ref 70–99)
Glucose-Capillary: 197 mg/dL — ABNORMAL HIGH (ref 70–99)

## 2013-09-04 LAB — CBC WITH DIFFERENTIAL/PLATELET
Basophils Absolute: 0 10*3/uL (ref 0.0–0.1)
Basophils Relative: 0 % (ref 0–1)
Eosinophils Absolute: 0.2 10*3/uL (ref 0.0–0.7)
Eosinophils Relative: 1 % (ref 0–5)
HCT: 33.8 % — ABNORMAL LOW (ref 39.0–52.0)
Hemoglobin: 10.9 g/dL — ABNORMAL LOW (ref 13.0–17.0)
Lymphocytes Relative: 8 % — ABNORMAL LOW (ref 12–46)
Lymphs Abs: 1.4 10*3/uL (ref 0.7–4.0)
MCH: 30.6 pg (ref 26.0–34.0)
MCHC: 32.2 g/dL (ref 30.0–36.0)
MCV: 94.9 fL (ref 78.0–100.0)
Monocytes Absolute: 1.5 10*3/uL — ABNORMAL HIGH (ref 0.1–1.0)
Monocytes Relative: 8 % (ref 3–12)
Neutro Abs: 14.6 10*3/uL — ABNORMAL HIGH (ref 1.7–7.7)
Neutrophils Relative %: 83 % — ABNORMAL HIGH (ref 43–77)
Platelets: 367 10*3/uL (ref 150–400)
RBC: 3.56 MIL/uL — ABNORMAL LOW (ref 4.22–5.81)
RDW: 13.4 % (ref 11.5–15.5)
WBC: 17.7 10*3/uL — ABNORMAL HIGH (ref 4.0–10.5)

## 2013-09-04 LAB — COMPREHENSIVE METABOLIC PANEL
ALT: 14 U/L (ref 0–53)
AST: 16 U/L (ref 0–37)
Albumin: 2.6 g/dL — ABNORMAL LOW (ref 3.5–5.2)
Alkaline Phosphatase: 69 U/L (ref 39–117)
Anion gap: 12 (ref 5–15)
BUN: 36 mg/dL — ABNORMAL HIGH (ref 6–23)
CO2: 23 mEq/L (ref 19–32)
Calcium: 8.9 mg/dL (ref 8.4–10.5)
Chloride: 105 mEq/L (ref 96–112)
Creatinine, Ser: 1.11 mg/dL (ref 0.50–1.35)
GFR calc Af Amer: 71 mL/min — ABNORMAL LOW (ref >90)
GFR calc non Af Amer: 62 mL/min — ABNORMAL LOW (ref >90)
Glucose, Bld: 110 mg/dL — ABNORMAL HIGH (ref 70–99)
Potassium: 4.2 mEq/L (ref 3.7–5.3)
Sodium: 140 mEq/L (ref 137–147)
Total Bilirubin: 0.5 mg/dL (ref 0.3–1.2)
Total Protein: 7.2 g/dL (ref 6.0–8.3)

## 2013-09-04 LAB — CLOSTRIDIUM DIFFICILE BY PCR: Toxigenic C. Difficile by PCR: NEGATIVE

## 2013-09-04 LAB — HEMOGLOBIN A1C
Hgb A1c MFr Bld: 6 % — ABNORMAL HIGH (ref <5.7)
Mean Plasma Glucose: 126 mg/dL — ABNORMAL HIGH (ref <117)
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
## 2013-09-04 NOTE — Progress Notes (Signed)
Occupational Therapy Session Note  Patient Details  Name: WYETH GOUDREAU MRN: [id number] Date of Birth: 1932-04-25  Today's Date: 09/04/2013 Time: 1130-1200 Time Calculation (min): 30 min  Short Term Goals: Week 1:  OT Short Term Goal 1 (Week 1): Pt will sit EOB for 15 min during functional task with max assist OT Short Term Goal 2 (Week 1): Patient will visual scan to left to locate 2/3 self-care items with max multimodal cues OT Short Term Goal 3 (Week 1): Patient will follow motor command within 20 seconds during self-care task OT Short Term Goal 4 (Week 1): Patient initiate anterior weight shift in preparation for functional transfer with max cues.   Skilled Therapeutic Interventions/Progress Updates: ADL-retraining with focus on improved intellectual awareness, functional use of R-UE, and bed mobility.  Pt received supine in bed asleep but aroused by gentle sternal rub and increased environmental stimulation.   OT noted presence of soiled diaper with loose BM oozing out of diaper.   With RN tech assisting OT, pt completed bed mobility, using R-U/LE with max assist to remove soiled diaper, perform hygiene, and don new diaper and gown.   Pt maintained eye-contact when cued during session and completed functional reaches and gentle SROM as instructed on right wrist and fingers with wrist brace removed.  Pt verbalized softly confirming orientation to place and choice of environmental controls after session ("lights off"  "TV on").  Therapy Documentation Precautions:  Precautions Precautions: Fall;Cervical Required Braces or Orthoses: Cervical Brace Cervical Brace: Hard collar Other Brace/Splint: R wrist splint and L cock up splint Restrictions Weight Bearing Restrictions: Yes Other Position/Activity Restrictions: WBAT on Rt UE per ORTHO note  Pain: Pain Assessment Pain Assessment: No/denies pain  See FIM for current functional status  Therapy/Group: Individual  Therapy  [place] 09/04/2013, 12:50 PM
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
## 2013-09-04 NOTE — Progress Notes (Signed)
Note/chart reviewed.  Katie Raechel Marcos, RD, LDN Pager #: [phone] After-Hours Pager #: [phone]  
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
## 2013-09-05 ENCOUNTER — Inpatient Hospital Stay (HOSPITAL_COMMUNITY): Payer: Self-pay

## 2013-09-05 ENCOUNTER — Inpatient Hospital Stay (HOSPITAL_COMMUNITY): Payer: Medicare Other | Admitting: Speech Pathology

## 2013-09-05 ENCOUNTER — Inpatient Hospital Stay (HOSPITAL_COMMUNITY): Payer: Medicare Other

## 2013-09-05 DIAGNOSIS — M7989 Other specified soft tissue disorders: Secondary | ICD-10-CM

## 2013-09-05 DIAGNOSIS — M79609 Pain in unspecified limb: Secondary | ICD-10-CM

## 2013-09-05 LAB — GLUCOSE, CAPILLARY
Glucose-Capillary: 122 mg/dL — ABNORMAL HIGH (ref 70–99)
Glucose-Capillary: 168 mg/dL — ABNORMAL HIGH (ref 70–99)
Glucose-Capillary: 120 mg/dL — ABNORMAL HIGH (ref 70–99)
Glucose-Capillary: 160 mg/dL — ABNORMAL HIGH (ref 70–99)

## 2013-09-05 LAB — URINE CULTURE
Colony Count: NO GROWTH
Culture: NO GROWTH

## 2013-09-05 MED ORDER — TIZANIDINE HCL 2 MG PO TABS
2.0000 mg | ORAL_TABLET | Freq: Three times a day (TID) | ORAL | Status: DC
  Administered 2013-09-05 – 2013-09-16 (×32): 2 mg via ORAL
  Filled 2013-09-05 (×39): qty 1

## 2013-09-05 MED ORDER — PRO-STAT SUGAR FREE PO LIQD
30.0000 mL | Freq: Three times a day (TID) | ORAL | Status: DC
  Administered 2013-09-05 – 2013-09-06 (×4): 30 mL
  Administered 2013-09-07 (×3)
  Administered 2013-09-08 – 2013-09-09 (×5): 30 mL
  Filled 2013-09-05 (×15): qty 30

## 2013-09-05 MED ORDER — CHOLESTYRAMINE LIGHT 4 G PO PACK
4.0000 g | PACK | Freq: Two times a day (BID) | ORAL | Status: DC
  Administered 2013-09-05 – 2013-10-02 (×53): 4 g via ORAL
  Filled 2013-09-05 (×58): qty 1

## 2013-09-05 NOTE — Care Management Note (Signed)
[facility] Individual Statement of Services  Patient Name:  Austin [name]  Date:  09/05/2013  Welcome to the [place].  Our goal is to provide you with an individualized program based on your diagnosis and situation, designed to meet your specific needs.  With this comprehensive rehabilitation program, you will be expected to participate in at least 3 hours of rehabilitation therapies Monday-Friday, with modified therapy programming on the weekends.  Your rehabilitation program will include the following services:  Physical Therapy (PT), Occupational Therapy (OT), Speech Therapy (ST), 24 hour per day rehabilitation nursing, Therapeutic Recreaction (TR), Neuropsychology, Case Management (Social Worker), Rehabilitation Medicine, Nutrition Services and Pharmacy Services  Weekly team conferences will be held on Tuesdays to discuss your progress.  Your Social Worker will talk with you frequently to get your input and to update you on team discussions.  Team conferences with you and your family in attendance may also be held.  Expected length of stay: 24-28 days  Overall anticipated outcome: minimal assistance  Depending on your progress and recovery, your program may change. Your Social Worker will coordinate services and will keep you informed of any changes. Your Social Worker's name and contact numbers are listed  below.  The following services may also be recommended but are not provided by the [facility] will be made to provide these services after discharge if needed.  Arrangements include referral to agencies that provide these services.  Your insurance has been verified to be:  Medicare and [place] Your primary doctor is:  Dr. Cathlean Cower  Pertinent information will be shared with  your doctor and your insurance company.  Social Worker:  [place], [place] or ([phone]   Information discussed with and copy given to patient by: Lennart Pall, 09/05/2013, 4:14 PM
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
## 2013-09-05 NOTE — Progress Notes (Signed)
VASCULAR LAB PRELIMINARY  PRELIMINARY  PRELIMINARY  PRELIMINARY  Bilateral lower extremity venous duplex  completed.    Preliminary report:  Right:  No evidence of DVT, superficial thrombosis, or Baker's cyst.   Left: DVT noted in a gastrocnemius vein.  No evidence of superficial thrombosis.  No Baker's cyst.    Incidental finding:  Area of mixed echoes in the right popliteal fossa/proximal calf with arterial flow at the perimeter.   Razi Hickle, RVT 09/05/2013, 8:01 PM
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
## 2013-09-05 NOTE — Progress Notes (Signed)
Occupational Therapy Session Note  Patient Details  Name: Austin [name] MRN: [id number] Date of Birth: Dec 02, 1932  Today's Date: 09/05/2013 Time: [id number] and [id number] Time Calculation (min): 60 min and 70 min  Short Term Goals: Week 1:  OT Short Term Goal 1 (Week 1): Pt will sit EOB for 15 min during functional task with max assist OT Short Term Goal 2 (Week 1): Patient will visual scan to left to locate 2/3 self-care items with max multimodal cues OT Short Term Goal 3 (Week 1): Patient will follow motor command within 20 seconds during self-care task OT Short Term Goal 4 (Week 1): Patient initiate anterior weight shift in preparation for functional transfer with max cues.   Skilled Therapeutic Interventions/Progress Updates:    Session 1: Pt seen for ADL retraining with focus on postural control, initiation, attention to left, command following, focused attention, and functional transfers. Pt received supine in bed. Required +2 assist supine>sit and squat pivot transfer bed>w/c. Began bathing at sink with pt perseverating on face requiring mod cues to redirect and sequence task. Pt reporting need to toilet. Provided urinal, however no output. Required +2 assist for squat pivot transfer w/c>toilet with use of grab bar. Pt with no output on toilet and required max-total assist for sitting balance. Completed stand pivot transfer toilet>w/c with +2 assist. Finished bathing and dressing at sink with max cues for washing LB secondary to perseveration on face. Pt required max cues for awareness of LLE during bathing and dressing. Required +2 assist for sit<>stand and pt verbalizing fear of falling. Pt demonstrated slightly improved motor command following ~25% of time with max multimodal cues. Pt demonstrated focused attention to task up to 5 seconds. Pt with improved intellectual awareness verbalizing need to work on "focusing" with increase time. Pt able to recall 1 task completed at end of  session with mod cues and increased time. Pt left sitting in w/c at RN station and [employer].   Session 2: Pt seen for 1:1 OT session with focus on midline orientation, weight shifting, postural control, bed mobility, and functional transfers. Pt received in geri chair. Engaged in face washing with min cues and pt placing towel over eyes. Pt reporting feeling "tired" then verbalized "yes" when asked if he was willing to participate in therapy. Transferred chair>mat with +2 assist. Pt positioned on R elbow to break pushing tendencies and facilitate weight shift to right. Engaged in reaching activities to facilitate anterior weight shift and trunk rotation. Pt required max-total assist sitting balance. Pt initiated lateral weight shift right with purposeful activity. Engaged in side lying on right side while providing scapular mobilizations facilitating shoulder protraction/retraction and elevation/depression. Pt demonstrating relaxation in pectoral muscles allowing for increased shoulder PROM. Pt incontinent of brief, requiring +2 assist for rolling L<>R for hygiene. Pt transferred back to geri chair with +2 and returned to room. Pt left with nurse tech present.   Therapy Documentation Precautions:  Precautions Precautions: Fall;Cervical Precaution Comments: peg Required Braces or Orthoses: Cervical Brace Cervical Brace: Hard collar Other Brace/Splint: R wrist splint and L cock up splint Restrictions Weight Bearing Restrictions: Yes Other Position/Activity Restrictions: WBAT on Rt UE per ORTHO note General:   Vital Signs:   Pain: No report of pain during therapy session.   See FIM for current functional status  Therapy/Group: Individual Therapy  Duayne Cal 09/05/2013, 9:32 AM

## 2013-09-05 NOTE — Progress Notes (Signed)
Social Work Patient ID: Austin [name], male   DOB: Sep 10, 1932, 80 y.o.   MRN: [id number]  Have not yet had an opportunity to meet with pt's wife to complete assessment (she has been coming to hospital late afternoons) so I have contacted her and set up meeting for Monday @ 1:00.    Aslee Such, LCSW

## 2013-09-05 NOTE — Progress Notes (Signed)
Physical Therapy Session Note  Patient Details  Name: ISMAILA PELSTER MRN: [id number] Date of Birth: 08/02/1932  Today's Date: 09/05/2013 Time: Treatment Session 1: 1100-1200; Treatment Session 2: [id number] Total Time(min): Treatment Session 1: 84min; Treatment Session 2: 67min   Short Term Goals: Week 1:  PT Short Term Goal 1 (Week 1): Patient will perform bed mobility with maxA x1. PT Short Term Goal 2 (Week 1): Patient will perform functional transfers with maxA x1. PT Short Term Goal 3 (Week 1): Patient will demonstrate sustained attention x10" in controlled environment with max cues.  Skilled Therapeutic Interventions/Progress Updates:  Treatment Session 1:  1:1. Pt received sitting on [facility] with nurse tech present, PT taking over. Pt unable to void, use of Clarise Cruz plus for t/f BSC>w/c with Ax2 persons for safety, total A for management of clothing.  Primary focus this session on activity tolerance, sustained attention, initiation, balance and sitting tolerance.   Pt req max cues for sustained attention and HOH assist to propel w/c 15', req total A overall due to poor grasp in R UE.  Pt engaged in seated game of horseshoes to facilitate postural control, weightshifting to R and anterior directions while reaching +/- BOS w/ R UE to prevent pushing to L, therapist facilitating increased WB through L UE and increased weight shifting. Pt req mod-total cues for sustained attention to task dependent upon level of activity in therapy gym.   Pt req Ax2persons to achieve standing, req manual facilitation increased overall extension at hips and L knee with progressing towards lateral weight shifts. Attempted stepping w/ R LE as therapist was blocking L knee, however, with step pt demonstrating gross flexion req seated rest for safety.   Pt req Ax2 persons for squat pivot transfers w/c>tx mat>geri chair. Pt engaged in game of war x25min w/ max cueing for attention and initiation of car management to  assist sitting tolerance and postural control in geri chair. With pillow support on L side, no progressive L lean noted. Pt left sitting in geri chair at end of session in room, nurse tech aware and stating that she would periodically check on pt between therapies.   Treatment Session 2:  1:1. Pt received supine in bed, ready for therapy. Focus this session on initiation, bed mobility and transfers. Pt req Ax2 persons for t/f sup<>sit EOB and squat pivot transfer bed<>geri chair. Pt found to be incontinent of bowel, transferred back to bed for clean up. Pt req total Ax2persons for clean up and B rolling multiple times. Pt req max multimodal cues to initiate use of R UE for rolling to L, once on side pt able to maintain sidelying w/ use of bed rail. Pt again req Ax2persons for t/f sup>sit EOB with pt following simple commands to reach R UE towards footboard to facilitate weight shift to R side to prevent L pushing. Pt reaching for far arm rest w/ R UE w/ mod multimodal cueing to facilitate anterior/R weight shift in preparation for squat pivot t/f to R side, req Ax2persons. Pt left semi-reclined in geri-chair at end of session w/ all needs in reach, RN aware.   Therapy Documentation Precautions:  Precautions Precautions: Fall;Cervical Precaution Comments: peg Required Braces or Orthoses: Cervical Brace Cervical Brace: Hard collar Other Brace/Splint: R wrist splint and L cock up splint Restrictions Weight Bearing Restrictions: Yes Other Position/Activity Restrictions: WBAT on Rt UE per ORTHO note  See FIM for current functional status  Therapy/Group: Individual Therapy  [name] 09/05/2013, 12:20  PM  

## 2013-09-05 NOTE — Progress Notes (Signed)
Speech Language Pathology Daily Session Note  Patient Details  Name: Austin [name] MRN: [id number] Date of Birth: 07-12-1932  Today's Date: 09/05/2013 Time: 1000-1100 Time Calculation (min): 60 min  Short Term Goals: Week 1: SLP Short Term Goal 1 (Week 1): Pt will demonstrate focused attention to functional tasks for 30 seconds with Max multimodal cues.  SLP Short Term Goal 2 (Week 1): Pt will follow basic 1 step commands with 75% of opportunities with Max A multimodal cues.  SLP Short Term Goal 3 (Week 1): Pt will identify 1 cognitive and 1 physical deficit with Max A multimodal cues.  SLP Short Term Goal 4 (Week 1): Pt will orient to time with utilization of external memory aids with Max A multimodal cues.  SLP Short Term Goal 5 (Week 1): Pt will utilize an increased vocal intensity at the word level with Max A multimodal cues.  SLP Short Term Goal 6 (Week 1): Pt will consume trials of ice chips with minimal overt s/s of aspiration with Max A mutlimodal cues.   Skilled Therapeutic Interventions: Skilled treatment session focused on cognitive-linguistic and dysphagia goals. Upon arrival, pt was lethargic while sitting up in his wheelchair. SLP facilitated session by providing Mod verbal and visual cues for initiation with self-care task of brushing his teeth while utilizing a suction toothbrush.  Pt also required Mod verbal cues for problem solving and thoroughness with task.  The patient demonstrated increased ROM and ability to perform functional tasks with labial musculature (pucker, rub lips together, etc.).  Pt was administered trials of ice chips with minimal lingual manipulation and AP transit. Pt also demonstrated a suspected delayed swallow initiation (~2 minutes with Max A multimodal cues) that led to a delayed cough response.  Suspect pt's overall swallowing function is impacted by fatigue due to patient falling asleep throughout the session and decreased focused attention to oral  bolus.  The patient independently recalled one event from pervious therapy session and utilized the calendar to recall the date.  The patient also identified 1 physical deficit (swallowing impairment) with Min question cues.  The patient also participated in a basic matching task from a field of 3 and required Total A multimodal cues for accuracy. Pt continues to be dysphonic and echolalic at times and requires total A for use of increased vocal intensity at the word and phrase level. Pt independently requested to use the bathroom and was handed off to the NT. Continue with current plan of care.    FIM:  Comprehension Comprehension Mode: Auditory Comprehension: 4-Understands basic 75 - 89% of the time/requires cueing 10 - 24% of the time Expression Expression Mode: Verbal Expression: 2-Expresses basic 25 - 49% of the time/requires cueing 50 - 75% of the time. Uses single words/gestures. Social Interaction Social Interaction: 3-Interacts appropriately 50 - 74% of the time - May be physically or verbally inappropriate. Problem Solving Problem Solving: 1-Solves basic less than 25% of the time - needs direction nearly all the time or does not effectively solve problems and may need a restraint for safety Memory Memory: 2-Recognizes or recalls 25 - 49% of the time/requires cueing 51 - 75% of the time FIM - Eating Eating Activity: 2: Hand over hand assist  Pain Pain Assessment Pain Assessment: No/denies pain  Therapy/Group: Individual Therapy  Tationna Fullard, [place] 09/05/2013, 1:54 PM

## 2013-09-06 ENCOUNTER — Inpatient Hospital Stay (HOSPITAL_COMMUNITY): Payer: Self-pay | Admitting: Speech Pathology

## 2013-09-06 ENCOUNTER — Inpatient Hospital Stay (HOSPITAL_COMMUNITY): Payer: Medicare Other | Admitting: Physical Therapy

## 2013-09-06 ENCOUNTER — Inpatient Hospital Stay (HOSPITAL_COMMUNITY): Payer: Medicare Other

## 2013-09-06 ENCOUNTER — Inpatient Hospital Stay (HOSPITAL_COMMUNITY): Payer: Self-pay | Admitting: *Deleted

## 2013-09-06 ENCOUNTER — Encounter (HOSPITAL_COMMUNITY): Payer: Self-pay | Admitting: Occupational Therapy

## 2013-09-06 ENCOUNTER — Inpatient Hospital Stay (HOSPITAL_COMMUNITY): Payer: Medicare Other | Admitting: Occupational Therapy

## 2013-09-06 DIAGNOSIS — Z5189 Encounter for other specified aftercare: Secondary | ICD-10-CM

## 2013-09-06 DIAGNOSIS — S0292XS Unspecified fracture of facial bones, sequela: Secondary | ICD-10-CM

## 2013-09-06 DIAGNOSIS — D62 Acute posthemorrhagic anemia: Secondary | ICD-10-CM

## 2013-09-06 DIAGNOSIS — S0291XS Unspecified fracture of skull, sequela: Secondary | ICD-10-CM

## 2013-09-06 DIAGNOSIS — S0190XA Unspecified open wound of unspecified part of head, initial encounter: Secondary | ICD-10-CM

## 2013-09-06 DIAGNOSIS — I824Z9 Acute embolism and thrombosis of unspecified deep veins of unspecified distal lower extremity: Secondary | ICD-10-CM

## 2013-09-06 LAB — GLUCOSE, CAPILLARY
Glucose-Capillary: 141 mg/dL — ABNORMAL HIGH (ref 70–99)
Glucose-Capillary: 147 mg/dL — ABNORMAL HIGH (ref 70–99)
Glucose-Capillary: 170 mg/dL — ABNORMAL HIGH (ref 70–99)
Glucose-Capillary: 214 mg/dL — ABNORMAL HIGH (ref 70–99)

## 2013-09-06 NOTE — Progress Notes (Signed)
Doppler studies completed via vascular lab, vascular lab tech reported the preliminary results to me after she was finished. MD notified, orders received. Will continue to monitor patient. Laketia Vicknair, Dione Plover
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
## 2013-09-06 NOTE — Progress Notes (Signed)
Occupational Therapy Session Note  Patient Details  Name: Austin [name] MRN: [id number] Date of Birth: 12-24-1932  Today's Date: 09/06/2013 Time: 1700-1745   Time Calculation (min): 45 min  Short Term Goals: Week 1:  OT Short Term Goal 1 (Week 1): Pt will sit EOB for 15 min during functional task with max assist OT Short Term Goal 2 (Week 1): Patient will visual scan to left to locate 2/3 self-care items with max multimodal cues OT Short Term Goal 3 (Week 1): Patient will follow motor command within 20 seconds during self-care task OT Short Term Goal 4 (Week 1): Patient initiate anterior weight shift in preparation for functional transfer with max cues.   Skilled Therapeutic Interventions/Progress Updates:   P tin bed when arrived. Focus on Visual attention to the left , rolling to achieve trunk rotation with max A, left body awareness in supine and sitting, transition from supine to sitting with total assist, pt= 10 %, sitting balance EOB with functional reaching to achieve a weight shift to each hip and obtain midline control.  Pt supine upon OT arrival.  Practiced rolling right and left and bent knees and hips.  Went from right side lying to sitting with total assist and max verbal cues.  Pt. Followed 1 step direction to weight shift to right forearm and push up to sit with multi modal cues.  While sitting EOB, pt exhibited strong posterior lean and pushing to the left.  Utilized NDT handling techniques for sitting posture and correction.   After sitting for 10 minutes, asked pt to sit straight and he was able for 1 minute.  He maintained focused attention for 5 seconds and then close his eyes.  He was able to recall the month and situation with one questioning cue.  He recalled OT's name after 5 minutes.  Returned pt to supine and positioned comfortably.    Practice eye scanning in all fields.  Pt brought both eyes to mid line and held to left lateral for 15 seconds.  Left in supine with all  needs in reach.  RN in room.    Therapy Documentation Precautions:  Precautions Precautions: Fall;Cervical Precaution Comments: peg Required Braces or Orthoses: Cervical Brace Cervical Brace: Hard collar Other Brace/Splint: R wrist splint and L cock up splint Restrictions Weight Bearing Restrictions: Yes RUE Weight Bearing: Weight bearing as tolerated Other Position/Activity Restrictions: WBAT on Rt UE per ORTHO note      Pain: Pain Assessment Pain Assessment: No/denies pain           See FIM for current functional status  Therapy/Group: Individual Therapy  Lisa Roca 09/06/2013, 5:07 PM

## 2013-09-06 NOTE — Progress Notes (Signed)
Physical Therapy Session Note  Patient Details  Name: Austin [name] MRN: [id number] Date of Birth: 01/01/1933  Today's Date: 09/06/2013 Time: [id number] Time Calculation (min): 53 min  Short Term Goals: Week 1:  PT Short Term Goal 1 (Week 1): Patient will perform bed mobility with maxA x1. PT Short Term Goal 2 (Week 1): Patient will perform functional transfers with maxA x1. PT Short Term Goal 3 (Week 1): Patient will demonstrate sustained attention x10" in controlled environment with max cues.  Skilled Therapeutic Interventions/Progress Updates:  Pt was seen bedside in the am. Pt + for L LE DVT, obtained clarification through nursing from MD to continue with PT as tolerated. Pt OOB to chair. Pt transported to rehab gym. Pt stood x 3 in parallel bars about 15 to 30 seconds each time. Pt transfers sit to stand in parallel bars with max to total A. Pt standing balance with max to total A, pt tends to push to the L requiring verbal and tactile cues to attempt to correct. Pt transferred w/c to edge of mat with sliding board, total A and second person stand by for safety. Pt tolerated edge of mat about 10 minutes with min to max A, pt tends to push to the left which worsens with fatigue, physical assist to correct. Transitioned pt to propped on R elbow able to maintain for brief periods with c/s to min A. Pt became faitgued, transferred edge of mat to supine with total A. Adjusted cervical collar while supine on mat. Pt transferred supine to edge of mat with total A. Pt transferred edge of mat to w/c with total A. Pt returned to room. Pt performed LE exercises, AAROM R LE and PROM L LE. Pt transferred w/c to edge of bed with total A. Pt transferred edge of bed to supine with total A. X ray at bedside to take pt for x rays.   Therapy Documentation Precautions:  Precautions Precautions: Fall;Cervical Precaution Comments: peg Required Braces or Orthoses: Cervical Brace Cervical Brace: Hard  collar Other Brace/Splint: R wrist splint and L cock up splint Restrictions Weight Bearing Restrictions: Yes RUE Weight Bearing: Weight bearing as tolerated Other Position/Activity Restrictions: WBAT on Rt UE per ORTHO note General: Amount of Missed PT Time (min): 7 Minutes Missed Time Reason: X-Ray;Other (comment) (MD clarification) Vital Signs:   Pain: No c/o pain.      See FIM for current functional status  Therapy/Group: Individual Therapy  Austin [name], Austin [name] 09/06/2013, 12:38 PM

## 2013-09-06 NOTE — Progress Notes (Signed)
Occupational Therapy Session Note  Patient Details  Name: Austin [name] MRN: [id number] Date of Birth: 1932/08/13  Today's Date: 09/06/2013 Time: 6803-2122 Time Calculation (min): 30 min  Short Term Goals: Week 1:  OT Short Term Goal 1 (Week 1): Pt will sit EOB for 15 min during functional task with max assist OT Short Term Goal 2 (Week 1): Patient will visual scan to left to locate 2/3 self-care items with max multimodal cues OT Short Term Goal 3 (Week 1): Patient will follow motor command within 20 seconds during self-care task OT Short Term Goal 4 (Week 1): Patient initiate anterior weight shift in preparation for functional transfer with max cues.   Skilled Therapeutic Interventions/Progress Updates:    Pt seen this session to address L visual scanning, LUE tone reduction techniques, and soft tissue massage to LUE contractures with stretching. Pt in bed and alert. He was able to state that he was in [place] and what had happened to him in a very soft voice.  Initially, pt had a strong R gaze preference and would not shift his eyes L past midline.  Throughout the session, this improved and pt was able to actively track his eyes through full ROM to L to look at this therapist. Pt has extreme rigidity in LUE. Worked on trunk rotation in supine with B knees bent and sidelying on R with pt attending but not able to initiate movement.  His tone did decrease to allow for full elbow flex and extension and wrist extension, he has tightness in internal rotators. Soft tissue mobs over pecs with very gentle external rotation. Pt attended well, but became drowsy toward end of session. L arm positioned into external rotation with pillow and L resting hand splint applied. Pt resting in bed with bed alarm and soft call light in reach.  Therapy Documentation Precautions:  Precautions Precautions: Fall;Cervical Precaution Comments: peg Required Braces or Orthoses: Cervical Brace Cervical Brace:  Hard collar Other Brace/Splint: R wrist splint and L cock up splint Restrictions Weight Bearing Restrictions: Yes RUE Weight Bearing: Weight bearing as tolerated Other Position/Activity Restrictions: WBAT on Rt UE per ORTHO note Pain: Pain Assessment Pain Assessment: No/denies pain ADL:  See FIM for current functional status  Therapy/Group: Individual Therapy  [place] 09/06/2013, 3:08 PM

## 2013-09-06 NOTE — Progress Notes (Signed)
Occupational Therapy Session Note  Patient Details  Name: Austin [name] MRN: [id number] Date of Birth: Feb 26, 1932  Today's Date: 09/06/2013 Time: 0900-0930 Time Calculation (min): 30 min  Short Term Goals: Week 1:  OT Short Term Goal 1 (Week 1): Pt will sit EOB for 15 min during functional task with max assist OT Short Term Goal 2 (Week 1): Patient will visual scan to left to locate 2/3 self-care items with max multimodal cues OT Short Term Goal 3 (Week 1): Patient will follow motor command within 20 seconds during self-care task OT Short Term Goal 4 (Week 1): Patient initiate anterior weight shift in preparation for functional transfer with max cues.   Skilled Therapeutic Interventions/Progress Updates:    1:1 P tin bed when arrived. Focus on  Visual attention to the left (making eye contact with therapist in conversation and selecting clothing for the day), bridging in the bed to pull up pants, rolling to achieve trunk rotation with max A, left body awareness in supine and sitting, transition from supine to sitting with max A +2, sitting balance EOB with functional reaching to achieve a weight shift to avoid posterior lean and pushing towards his left, focused to sustained attention, etc. Pt able to attend to task for 5 seconds at a time. Trial of tilt in space w/c with adjustements for pt's posture. Transfer bed to w/c towards pt's left max A +2. Pt left in w/c reclined with quick release.   Therapy Documentation Precautions:  Precautions Precautions: Fall;Cervical Precaution Comments: peg Required Braces or Orthoses: Cervical Brace Cervical Brace: Hard collar Other Brace/Splint: R wrist splint and L cock up splint Restrictions Weight Bearing Restrictions: Yes RUE Weight Bearing: Weight bearing as tolerated Other Position/Activity Restrictions: WBAT on Rt UE per ORTHO note Pain:  no c/o pain in session    See FIM for current functional status  Therapy/Group: Individual  Therapy  Willeen Cass [facility] 09/06/2013, 10:12 AM

## 2013-09-06 NOTE — Progress Notes (Signed)
Speech Language Pathology Daily Session Note  Patient Details  Name: Austin [name] MRN: [id number] Date of Birth: May 29, 1932  Today's Date: 09/06/2013 Time: [id number] Time Calculation (min): 30 min  Short Term Goals: Week 1: SLP Short Term Goal 1 (Week 1): Pt will demonstrate focused attention to functional tasks for 30 seconds with Max multimodal cues.  SLP Short Term Goal 2 (Week 1): Pt will follow basic 1 step commands with 75% of opportunities with Max A multimodal cues.  SLP Short Term Goal 3 (Week 1): Pt will identify 1 cognitive and 1 physical deficit with Max A multimodal cues.  SLP Short Term Goal 4 (Week 1): Pt will orient to time with utilization of external memory aids with Max A multimodal cues.  SLP Short Term Goal 5 (Week 1): Pt will utilize an increased vocal intensity at the word level with Max A multimodal cues.  SLP Short Term Goal 6 (Week 1): Pt will consume trials of ice chips with minimal overt s/s of aspiration with Max A mutlimodal cues.   Skilled Therapeutic Interventions: Skilled treatment session focused on addressing dysphagia goals.  Upon SLP entering room patient made eye contact and requested ice chip.  SLP facilitated session by providing Mod verbal and visual cues for initiation and sustained attention to brushing his teeth via suction toothbrush.  Patient self-fed ice chip trials with hand-over-hand assist with minimal lingual manipulation and prolonged anterior to posterior transit and intermittent mastication.  SLP provided Mod verbal cues to attend to boluses and slow pace of self-feeding to allow for swallow prior to presentation of next one.  Patient demonstrated a suspected delay in swallow initiation; however, more timely when compared to yesterday's session ~45 seconds.  Patient exhibited a delayed cough response in 2/10 trials.  Continue with current plan of care.   FIM:  Comprehension Comprehension Mode: Auditory Comprehension: 4-Understands  basic 75 - 89% of the time/requires cueing 10 - 24% of the time Expression Expression Mode: Verbal Expression: 2-Expresses basic 25 - 49% of the time/requires cueing 50 - 75% of the time. Uses single words/gestures. Social Interaction Social Interaction: 3-Interacts appropriately 50 - 74% of the time - May be physically or verbally inappropriate. Problem Solving Problem Solving: 1-Solves basic less than 25% of the time - needs direction nearly all the time or does not effectively solve problems and may need a restraint for safety Memory Memory: 2-Recognizes or recalls 25 - 49% of the time/requires cueing 51 - 75% of the time FIM - Eating Eating Activity: 2: Hand over hand assist  Pain Pain Assessment Pain Assessment: No/denies pain  Therapy/Group: Individual Therapy  [name]., [place] [id number]  [place] 09/06/2013, 12:16 PM

## 2013-09-07 ENCOUNTER — Inpatient Hospital Stay (HOSPITAL_COMMUNITY): Payer: Self-pay

## 2013-09-07 DIAGNOSIS — S0180XA Unspecified open wound of other part of head, initial encounter: Secondary | ICD-10-CM

## 2013-09-07 LAB — GLUCOSE, CAPILLARY
Glucose-Capillary: 111 mg/dL — ABNORMAL HIGH (ref 70–99)
Glucose-Capillary: 192 mg/dL — ABNORMAL HIGH (ref 70–99)
Glucose-Capillary: 187 mg/dL — ABNORMAL HIGH (ref 70–99)
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
## 2013-09-07 NOTE — Progress Notes (Signed)
Physical Therapy Session Note  Patient Details  Name: Austin [name] MRN: [id number] Date of Birth: 1932-08-10  Today's Date: 09/07/2013 Session 1 Time: 1401-1501 Time Calculation (min): 60 min    Short Term Goals: Week 1:  PT Short Term Goal 1 (Week 1): Patient will perform bed mobility with maxA x1. PT Short Term Goal 2 (Week 1): Patient will perform functional transfers with maxA x1. PT Short Term Goal 3 (Week 1): Patient will demonstrate sustained attention x10" in controlled environment with max cues.  Skilled Therapeutic Interventions/Progress Updates:    Session focused on seated balance, pushing remediation, upright tolerance. PT Barbaraann Rondo present for entire session. Pt received supine in bed w/ family present, agreeable to participate in therapy. Pt w/ TotalA to don pants in supine and sitting. Pt at [facility] for bed mobility for entire session, however did initiate movement w/ rolling to R but unable to sustain any movement. Pt also at [facility] throughout session for boosting to scoot hips back on bed/table. Pt w/ squat pivot transfers bed/mat <> w/c w/ MaxA-TotalA, with decreased assist needed w/ ideal setup and low distraction environment. Worked on seated balance/attention and pushing remediation at edge of mat. Pt responded well to sustained B hip adduction provided by therapist legs. With ideal setup and low distractions, pt was able to maintain midline seated w/ MinA. Pt did not respond well to visual cueing (mirror) for midline feedback. Pt was able to maintain seated balance for 8-10 minutes at a time before requiring supine rest break. Pt w/ x1 sit<>stand and 3 steps w/ TotalA provided by therapist in parallel bars. Pt demonstrated wide BOS and decreased initiation in standing. Dependent for w/c propulsion throughout session as pt was in Pollock chair. Pt left supine in bed w/ all needs within reach.  Therapy Documentation Precautions:  Precautions Precautions:  Fall;Cervical Precaution Comments: peg Required Braces or Orthoses: Cervical Brace Cervical Brace: Hard collar Other Brace/Splint: R wrist splint and L cock up splint Restrictions Weight Bearing Restrictions: Yes RUE Weight Bearing: Weight bearing as tolerated Other Position/Activity Restrictions: WBAT on Rt UE per ORTHO note Pain: Pain Assessment Pain Assessment: No/denies pain Pain Score: 0-No pain  See FIM for current functional status  Therapy/Group: Individual Therapy  Rada Hay Rada Hay, PT, DPT 09/07/2013, 4:54 PM

## 2013-09-07 NOTE — Plan of Care (Signed)
Problem: RH BLADDER ELIMINATION Goal: RH STG MANAGE BLADDER WITH EQUIPMENT WITH ASSISTANCE STG Manage Bladder With Equipment With Mod Assistance  Outcome: Completed/Met Date Met:  09/07/13 Foley d/c'd 8/12

## 2013-09-07 NOTE — Plan of Care (Signed)
Problem: RH BLADDER ELIMINATION Goal: RH STG MANAGE BLADDER WITH ASSISTANCE STG Manage Bladder With mod Assistance  Outcome: Not Progressing Pt needed to be catheterized ; incontinent  Of bladder; bladder scan = 200cc

## 2013-09-08 ENCOUNTER — Ambulatory Visit (HOSPITAL_COMMUNITY): Payer: Self-pay | Admitting: *Deleted

## 2013-09-08 ENCOUNTER — Inpatient Hospital Stay (HOSPITAL_COMMUNITY): Payer: Self-pay | Admitting: *Deleted

## 2013-09-08 ENCOUNTER — Inpatient Hospital Stay (HOSPITAL_COMMUNITY): Payer: Medicare Other

## 2013-09-08 ENCOUNTER — Inpatient Hospital Stay (HOSPITAL_COMMUNITY): Payer: Self-pay

## 2013-09-08 ENCOUNTER — Inpatient Hospital Stay (HOSPITAL_COMMUNITY): Payer: Self-pay | Admitting: Speech Pathology

## 2013-09-08 DIAGNOSIS — S069X9A Unspecified intracranial injury with loss of consciousness of unspecified duration, initial encounter: Secondary | ICD-10-CM

## 2013-09-08 DIAGNOSIS — S069XAA Unspecified intracranial injury with loss of consciousness status unknown, initial encounter: Secondary | ICD-10-CM

## 2013-09-08 DIAGNOSIS — Z5189 Encounter for other specified aftercare: Secondary | ICD-10-CM

## 2013-09-08 LAB — CBC WITH DIFFERENTIAL/PLATELET
Basophils Absolute: 0 10*3/uL (ref 0.0–0.1)
Basophils Relative: 0 % (ref 0–1)
Eosinophils Absolute: 0.4 10*3/uL (ref 0.0–0.7)
Eosinophils Relative: 3 % (ref 0–5)
HCT: 34.7 % — ABNORMAL LOW (ref 39.0–52.0)
Hemoglobin: 11.5 g/dL — ABNORMAL LOW (ref 13.0–17.0)
Lymphocytes Relative: 9 % — ABNORMAL LOW (ref 12–46)
Lymphs Abs: 1.2 10*3/uL (ref 0.7–4.0)
MCH: 31.6 pg (ref 26.0–34.0)
MCHC: 33.1 g/dL (ref 30.0–36.0)
MCV: 95.3 fL (ref 78.0–100.0)
Monocytes Absolute: 1.3 10*3/uL — ABNORMAL HIGH (ref 0.1–1.0)
Monocytes Relative: 10 % (ref 3–12)
Neutro Abs: 10.2 10*3/uL — ABNORMAL HIGH (ref 1.7–7.7)
Neutrophils Relative %: 78 % — ABNORMAL HIGH (ref 43–77)
Platelets: 331 10*3/uL (ref 150–400)
RBC: 3.64 MIL/uL — ABNORMAL LOW (ref 4.22–5.81)
RDW: 14 % (ref 11.5–15.5)
WBC: 13.1 10*3/uL — ABNORMAL HIGH (ref 4.0–10.5)

## 2013-09-08 LAB — BASIC METABOLIC PANEL
Anion gap: 13 (ref 5–15)
BUN: 44 mg/dL — ABNORMAL HIGH (ref 6–23)
CO2: 24 mEq/L (ref 19–32)
Calcium: 9.6 mg/dL (ref 8.4–10.5)
Chloride: 104 mEq/L (ref 96–112)
Creatinine, Ser: 1 mg/dL (ref 0.50–1.35)
GFR calc Af Amer: 81 mL/min — ABNORMAL LOW (ref >90)
GFR calc non Af Amer: 70 mL/min — ABNORMAL LOW (ref >90)
Glucose, Bld: 126 mg/dL — ABNORMAL HIGH (ref 70–99)
Potassium: 5.1 mEq/L (ref 3.7–5.3)
Sodium: 141 mEq/L (ref 137–147)

## 2013-09-08 LAB — GLUCOSE, CAPILLARY
Glucose-Capillary: 161 mg/dL — ABNORMAL HIGH (ref 70–99)
Glucose-Capillary: 122 mg/dL — ABNORMAL HIGH (ref 70–99)
Glucose-Capillary: 147 mg/dL — ABNORMAL HIGH (ref 70–99)
Glucose-Capillary: 98 mg/dL (ref 70–99)
Glucose-Capillary: 136 mg/dL — ABNORMAL HIGH (ref 70–99)

## 2013-09-08 MED ORDER — IOHEXOL 300 MG/ML  SOLN
50.0000 mL | Freq: Once | INTRAMUSCULAR | Status: AC | PRN
  Administered 2013-09-08: 50 mL via ORAL

## 2013-09-08 NOTE — Progress Notes (Signed)
Speech Language Pathology Daily Session Note  Patient Details  Name: Austin [name] MRN: [id number] Date of Birth: [birth year]  Today's Date: 09/08/2013 SLP Individual Time: 1103-1203 SLP Individual Time Calculation (min): 60 min  Short Term Goals: Week 1: SLP Short Term Goal 1 (Week 1): Pt will demonstrate focused attention to functional tasks for 30 seconds with Max multimodal cues.  SLP Short Term Goal 2 (Week 1): Pt will follow basic 1 step commands with 75% of opportunities with Max A multimodal cues.  SLP Short Term Goal 3 (Week 1): Pt will identify 1 cognitive and 1 physical deficit with Max A multimodal cues.  SLP Short Term Goal 4 (Week 1): Pt will orient to time with utilization of external memory aids with Max A multimodal cues.  SLP Short Term Goal 5 (Week 1): Pt will utilize an increased vocal intensity at the word level with Max A multimodal cues.  SLP Short Term Goal 6 (Week 1): Pt will consume trials of ice chips with minimal overt s/s of aspiration with Max A mutlimodal cues.   Skilled Therapeutic Interventions:  Pt was seen for skilled speech therapy targeting dysphagia and cognitive goals.  Pt was upright in wheelchair upon arrival, awake, alert, and agreeable to participate in [place] activities.  Pt required overall mod faded to min assist for finger occlusion and sustained attention to complete oral care with suction and toothette.  SLP facilitated session with trials of ice chips with pt requiring overall mod-max assist to attend to bolus in order to initiate a timely swallow with pt exhibiting a delayed cough x2.  Pt required frequent cuing for redirection as he was noted to perseverate on self feeding tasks with trials of ice chips and benefited from having cup and spoon removed from his visual field to minimize visual distractions.  Pt was oriented to place with supervision question cues, oriented to date with supervision instructional cues to use calendar, and oriented to  time with mod assist to use clock in room as an external aid to improve temporal awareness.  Pt recalled at least 3 details from previous therapy sessions with min question cues and returned demonstration for use of therapy schedule with mod assist multimodal cues to improve recall of daily events and information.  Continue per current plan of care.    FIM:  Comprehension Comprehension Mode: Auditory Comprehension: 4-Understands basic 75 - 89% of the time/requires cueing 10 - 24% of the time Expression Expression Mode: Verbal Expression: 2-Expresses basic 25 - 49% of the time/requires cueing 50 - 75% of the time. Uses single words/gestures. Social Interaction Social Interaction: 4-Interacts appropriately 75 - 89% of the time - Needs redirection for appropriate language or to initiate interaction. Problem Solving Problem Solving: 2-Solves basic 25 - 49% of the time - needs direction more than half the time to initiate, plan or complete simple activities Memory Memory: 2-Recognizes or recalls 25 - 49% of the time/requires cueing 51 - 75% of the time FIM - Eating Eating Activity: 4: Help with managing cup/glass;4: Help with picking up utensils;4: Helper occasionally brings food to mouth  Pain Pain Assessment Pain Assessment: No/denies pain Pain Score: 0-No pain  Therapy/Group: Individual Therapy  Windell Moulding, M.A. CCC-SLP  Romie Keeble, [place]inda [place] 09/08/2013, 12:17 PM
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
## 2013-09-08 NOTE — Progress Notes (Signed)
Patient unable to void.  Bladder scan is 862 ml.  In and out cath performed by Shaune Spittle, NT with results of 925 ml clear, amber, urine without odor.  Patient tolerated well.

## 2013-09-08 NOTE — Progress Notes (Signed)
Occupational Therapy Session Note  Patient Details  Name: Austin [name] MRN: [id number] Date of Birth: 07-06-1932  Today's Date: 09/08/2013 OT Individual Time: 0930-1030 and 1401-1444 OT Individual Time Calculation (min): 60 min and 43 min   Missed Time: 17 min due to fatigue   Short Term Goals: Week 1:  OT Short Term Goal 1 (Week 1): Pt will sit EOB for 15 min during functional task with max assist OT Short Term Goal 2 (Week 1): Patient will visual scan to left to locate 2/3 self-care items with max multimodal cues OT Short Term Goal 3 (Week 1): Patient will follow motor command within 20 seconds during self-care task OT Short Term Goal 4 (Week 1): Patient initiate anterior weight shift in preparation for functional transfer with max cues.   Skilled Therapeutic Interventions/Progress Updates:    Session 1: Pt seen for ADL retraining with focus on visual attention L, focused attention, initiation, postural control, and sit<>stand. Pt received sitting w/c. Engaged in therapeutic conversation with therapist on L side and pt making eye contact throughout. Pt oriented x4 with pt initiating use of calendar. Pt continues to demonstrate impaired intellectual awareness as he reported having no difficulties physically or cognitively at this time. Engaged in bathing at sink with emphasis on anterior weight shift, focused attention and sit<>stand. Pt required max assist for focused attention to task up to 5 seconds. Pt scanned to L to locate wash cloth consistently with mod multimodal cues. Pt required min cues to wash LLE and initiated washing LUE. Pt required max-total assist for anterior weight shift with minimal initiation despite max multimodal cues. Pt sustained anterior weight shift for lower LB self-care for 10 seconds with min-SBA. Pt required +2 assist for sit<>stand at sink d/t pushing tendencies. Used external aid (touch w/c wheel, floor, etc) as cue for lateral weight shift right while  sitting in chair as pt with no awareness of poor posture with mirror as visual cue. At end of session pt verbalized having difficulty with "sitting forward" and "standing" with increased time and min questioning cues. Pt left in w/c with QRB donned.   Session 2: Pt seen for 1:1 OT session with focus on visual attention to L, command following, initiation, trunk rotation, and LUE NMR. Pt received supine in bed. RN informed therapist that peg tube was removed and awaiting xray results. RN asking to complete therapy supine in bed until results are reviewed. Pt's wife present. Discussed CLOF, goals of therapy, ELOS, and began discharge plans. Wife pleased with therapy this far and has no questions or concerns at this time. Engaged in rolling L<>R with max assist to facilitate trunk rotation. Pt demonstrating right gaze preference throughout bed mobility, attending to therapist on left 5-6 seconds. Pt with minimal initiation of task despite max cues. Pt following simple commands (bend leg, grab rail) with tactile cues ~50% of time. Provided LUE scapular mobilizations while in side lying on right with emphasis on shoulder protraction/retraction and elevation/depression. Pt allowed for full elbow flexion and extension. [name] limited by external rotation d/t tightness of pec muscles. Pt falling asleep towards end of session. Provided cool wash cloth with pt assisting to wash face. Pt placed towel over face and would not remove upon command. Pt verbalized feeling "tired." Pt closing eyes and difficult to arouse. Ended session 17 min early d/t pt's fatigue. Positioning LUE into slight external rotation with pillows. Pt left resting in bed with all needs in reach.  Therapy Documentation Precautions:  Precautions Precautions: Fall;Cervical Precaution Comments: peg Required Braces or Orthoses: Cervical Brace Cervical Brace: Hard collar Other Brace/Splint: R wrist splint and L cock up splint Restrictions Weight  Bearing Restrictions: Yes RUE Weight Bearing: Weight bearing as tolerated Other Position/Activity Restrictions: WBAT on Rt UE per ORTHO note General:   Vital Signs:   Pain: Pain Assessment Pain Assessment: No/denies pain Pain Score: 0-No pain  See FIM for current functional status  Therapy/Group: Individual Therapy  Duayne Cal 09/08/2013, 12:22 PM
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
## 2013-09-08 NOTE — Progress Notes (Addendum)
Discovered Patient's PEG tube was out of place, notified P. Love, PA  And assisted her in placing a 41fr foley to maintain the tunnel.  Xray ordered to verify patency/placement. X-Ray indicates that the PEG tube is patent and properly placed. Per P. Love PA, resume using PEG tube

## 2013-09-08 NOTE — Care Management Note (Addendum)
[facility] Individual Statement of Services  Patient Name:  Austin [name]  Date:  09/05/2013  Welcome to the [facility].  Our goal is to provide you with an individualized program based on your diagnosis and situation, designed to meet your specific needs.  With this comprehensive rehabilitation program, you will be expected to participate in at least 3 hours of rehabilitation therapies Monday-Friday, with modified therapy programming on the weekends.  Your rehabilitation program will include the following services:  Physical Therapy (PT), Occupational Therapy (OT), Speech Therapy (ST), 24 hour per day rehabilitation nursing, Therapeutic Recreaction (TR), Neuropsychology, Case Management (Social Worker), Rehabilitation Medicine, Nutrition Services and Pharmacy Services  Weekly team conferences will be held on Tuesdays to discuss your progress.  Your Social Worker will talk with you frequently to get your input and to update you on team discussions.  Team conferences with you and your family in attendance may also be held.  Expected length of stay: 28 days  Overall anticipated outcome: minimal assistance  Depending on your progress and recovery, your program may change. Your Social Worker will coordinate services and will keep you informed of any changes. Your Social Worker's name and contact numbers are listed  below.  The following services may also be recommended but are not provided by the [facility] will be made to provide these services after discharge if needed.  Arrangements include referral to agencies that provide these services.  Your insurance has been verified to be:  Medicare and [place] Your primary doctor is:  Dr. Cathlean Cower  Pertinent information will be shared with your  doctor and your insurance company.  Social Worker:  [place], [place] or ([phone]   Information discussed with and copy given to patient by: Lennart Pall, 09/05/2013, 3:13 PM

## 2013-09-08 NOTE — Progress Notes (Signed)
Patient unable to void.  Bladder scan results of 533 ml.  In and out cath performed by Shaune Spittle, NT for 600 ml.  Patient tolerated well.
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
## 2013-09-09 ENCOUNTER — Inpatient Hospital Stay (HOSPITAL_COMMUNITY): Payer: Medicare Other

## 2013-09-09 ENCOUNTER — Inpatient Hospital Stay (HOSPITAL_COMMUNITY): Payer: Medicare Other | Admitting: *Deleted

## 2013-09-09 ENCOUNTER — Inpatient Hospital Stay (HOSPITAL_COMMUNITY): Payer: Medicare Other | Admitting: Speech Pathology

## 2013-09-09 DIAGNOSIS — M79609 Pain in unspecified limb: Secondary | ICD-10-CM

## 2013-09-09 DIAGNOSIS — M7989 Other specified soft tissue disorders: Secondary | ICD-10-CM

## 2013-09-09 LAB — GLUCOSE, CAPILLARY
Glucose-Capillary: 133 mg/dL — ABNORMAL HIGH (ref 70–99)
Glucose-Capillary: 157 mg/dL — ABNORMAL HIGH (ref 70–99)
Glucose-Capillary: 134 mg/dL — ABNORMAL HIGH (ref 70–99)

## 2013-09-09 MED ORDER — FREE WATER
200.0000 mL | Freq: Every day | Status: DC
  Administered 2013-09-09 – 2013-10-03 (×112): 200 mL

## 2013-09-09 NOTE — Progress Notes (Signed)
SLP Cancellation Note  Patient Details Name: Austin [name] MRN: [id number] DOB: December 07, 1932   Cancelled treatment:        Patient missed 60 minutes of skilled SLP services due to being off unit for X-ray.                                                                                                Gunnar Fusi, M.A., CCC-SLP [phone]  [place] 09/09/2013, 11:00 AM
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
## 2013-09-09 NOTE — Progress Notes (Signed)
Occupational Therapy Session Note  Patient Details  Name: Austin [name] MRN: [id number] Date of Birth: 02-Feb-1932  Today's Date: 09/09/2013 OT Individual Time: [id number] OT Individual Time Calculation (min): 60 min   Short Term Goals: Week 1:  OT Short Term Goal 1 (Week 1): Pt will sit EOB for 15 min during functional task with max assist OT Short Term Goal 2 (Week 1): Patient will visual scan to left to locate 2/3 self-care items with max multimodal cues OT Short Term Goal 3 (Week 1): Patient will follow motor command within 20 seconds during self-care task OT Short Term Goal 4 (Week 1): Patient initiate anterior weight shift in preparation for functional transfer with max cues.   Skilled Therapeutic Interventions/Progress Updates:    1:1 Pt in bed when arrived. Focused on bed mobility including rotation of hips and bridging to don pants. Pt with increased left body awareness with attempts to lift LE. Roll into sidelying on his right with max a, Max A sidelying to sitting. Mod A for sitting balance due to pushing towards left side- able to correct with max environmental cues. Squat pivot transfer bed to w/c with max A +2 with focus on maintaining forward weight shift during transfer. Pt perform UB bathing and dressing at sink with attention to left body and visual field, proper positioning of left UE and attempts/ opportunities for functional use with bathing. Pt required a purpose/ something to reach for to achieve a forward weight shift and to lean forward to wash back/ pull down shirt. Pt requires mod- max cuing to maintain forward weight shift for 10 seconds. Transitioned into the gym. Pt placed in standing frame to work on postural control in supported standing position with visual feedback from mirror. Pt able to identify he was leaning/ pushing towards the left and able to self correct 25% of time with visual and environmental cues. The more tactile and physical contact the pushing  increased. With the 2nd standing pt able to step with right leg inward to narrowing his BOS. Pt able to maintain standing at midline for 10 seconds at a time 4 times! Returned to w/c and left with PT. Pt with significant improvement today compared to Sat (last time this therapist saw him) in his motor initiation and follow through.   Therapy Documentation Precautions:  Precautions Precautions: Fall;Cervical Precaution Comments: peg Required Braces or Orthoses: Cervical Brace Cervical Brace: Hard collar Other Brace/Splint: R wrist splint and L cock up splint Restrictions Weight Bearing Restrictions: Yes RUE Weight Bearing: Weight bearing as tolerated Other Position/Activity Restrictions: WBAT on Rt UE per ORTHO note Pain: No /co pain  See FIM for current functional status  Therapy/Group: Individual Therapy  Willeen Cass [facility] 09/09/2013, 8:49 AM

## 2013-09-09 NOTE — Progress Notes (Signed)
Recreational Therapy Session Note  Patient Details  Name: Austin [name] MRN: [id number] Date of Birth: May 24, 1932 Today's Date: 09/09/2013  Pain: no c/o Skilled Therapeutic Interventions/Progress Updates: Session focused on bed mobility, transfers, initiation, sustained & selective attention, sitting balance, midline orientation & standing balance with weight shifting.  Pt in bed with bowel  Incontinence.  Pt required total assist +2 for rolling & transfers.  Continued discussion with pt about leisure interests with increased responses today.  Therapy/Group: Co-Treatment   Latandra Loureiro 09/09/2013, 3:46 PM
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
## 2013-09-09 NOTE — Progress Notes (Signed)
Occupational Therapy Session Note  Patient Details  Name: Austin [name] MRN: [id number] Date of Birth: 1932/06/23  Today's Date: 09/09/2013 OT Co-Treatment Time: [id number] (co-tx with SLP NP [phone] OT Co-Treatment Time Calculation (min): 15 min   Short Term Goals: Week 1:  OT Short Term Goal 1 (Week 1): Pt will sit EOB for 15 min during functional task with max assist OT Short Term Goal 2 (Week 1): Patient will visual scan to left to locate 2/3 self-care items with max multimodal cues OT Short Term Goal 3 (Week 1): Patient will follow motor command within 20 seconds during self-care task OT Short Term Goal 4 (Week 1): Patient initiate anterior weight shift in preparation for functional transfer with max cues.   Skilled Therapeutic Interventions/Progress Updates:    Pt seen for therapeutic co-tx with SLP (NP) with focus on command following, focused attention, visual attention to L, and initiation. Pt received sitting in w/c. Completed oral care with pt demonstrating perseveration with min cues to redirect. Pt with improved awareness to L as he was both sides of mouth without cues. Completed card game of war with cards placed in left visual field. Pt required min multimodal cues to locate cards to left side. Pt with 100% accuracy determine higher card throughout task. Pt demonstrated focused attention to task up to 5 seconds, requiring cues to redirect. Pt demonstrated improved initiation throughout therapeutic activity this PM. Pt left in w/c with QRB donned and wife present.   Therapy Documentation Precautions:  Precautions Precautions: Fall;Cervical Precaution Comments: peg Required Braces or Orthoses: Cervical Brace Cervical Brace: Hard collar Other Brace/Splint: R wrist splint and L cock up splint Restrictions Weight Bearing Restrictions: Yes RUE Weight Bearing: Weight bearing as tolerated Other Position/Activity Restrictions: WBAT on Rt UE per ORTHO note General:    Vital Signs: Therapy Vitals Temp: 97.7 F (36.5 C) Temp src: Oral Pulse Rate: 85 Resp: 18 BP: 107/60 mmHg Patient Position (if appropriate): Sitting Oxygen Therapy SpO2: 99 % O2 Device: None (Room air) Pain: Pain Assessment Pain Assessment: No/denies pain Pain Score: 0-No pain (only c/o L shoulder pain during PROM)  See FIM for current functional status  Therapy/Group: Co-Treatment  Luddie Boghosian N 09/09/2013, 3:33 PM

## 2013-09-09 NOTE — Progress Notes (Signed)
Physical Therapy Session Note  Patient Details  Name: Austin [name] MRN: [id number] Date of Birth: [birth year]  Today's Date: 09/09/2013 PT Individual Time: 1300-1400 PT Individual Time Calculation (min): 60 min   Short Term Goals: Week 1:  PT Short Term Goal 1 (Week 1): Patient will perform bed mobility with maxA x1. PT Short Term Goal 2 (Week 1): Patient will perform functional transfers with maxA x1. PT Short Term Goal 3 (Week 1): Patient will demonstrate sustained attention x10" in controlled environment with max cues.  Skilled Therapeutic Interventions/Progress Updates:    Patient received supine in bed, incontinent of bowel. Rolling to B sides to perform hygiene and donning of new brief with +2 assist, much improved initiation with rolling. +2 for functional transfers. Remainder of session focused on initiation, sustained attention, and command following with functional transfers, postural control in sitting and standing with emphasis on upright posture, midline orientation, and anterior weight shifts, standing with pre-gait activities for R LE forward/retro stepping. Patient with much increased fatigue as compared to this AM. Patient left sitting in tilt-n-space wheelchair with RN present.  Therapy Documentation Precautions:  Precautions Precautions: Fall;Cervical Precaution Comments: peg Required Braces or Orthoses: Cervical Brace Cervical Brace: Hard collar Other Brace/Splint: R wrist splint and L cock up splint Restrictions Weight Bearing Restrictions: Yes RUE Weight Bearing: Weight bearing as tolerated Other Position/Activity Restrictions: WBAT on Rt UE per ORTHO note Pain: Pain Assessment Pain Assessment: No/denies pain Pain Score: 0-No pain (only c/o L shoulder pain during PROM) Locomotion : Ambulation Ambulation/Gait Assistance: Not tested (comment)   See FIM for current functional status  Therapy/Group: Individual Therapy  Lillia Abed. Claudie Brickhouse,  PT, DPT 09/09/2013, 2:40 PM

## 2013-09-09 NOTE — Progress Notes (Signed)
Patient information reviewed and entered into [facility] by Ileana Ladd, covering PPS coordinator. Information including medical coding and functional independence measure will be reviewed and updated through discharge.  Per nursing, patient's wife was given "Data Collection Information Summary for Patients in Inpatient Rehabilitation Facilities with attached [place] [facility] Records" upon admission.

## 2013-09-09 NOTE — Progress Notes (Signed)
Speech Language Pathology Daily Session Note  Patient Details  Name: Austin [name] MRN: [id number] Date of Birth: [birth year]  Today's Date: 09/09/2013 SLP Co-Treatment Time: [id number] (Co-tx with OT KP from [id number]) SLP Co-Treatment Time Calculation (min): 18 min  Short Term Goals: Week 1: SLP Short Term Goal 1 (Week 1): Pt will demonstrate focused attention to functional tasks for 30 seconds with Max multimodal cues.  SLP Short Term Goal 2 (Week 1): Pt will follow basic 1 step commands with 75% of opportunities with Max A multimodal cues.  SLP Short Term Goal 3 (Week 1): Pt will identify 1 cognitive and 1 physical deficit with Max A multimodal cues.  SLP Short Term Goal 4 (Week 1): Pt will orient to time with utilization of external memory aids with Max A multimodal cues.  SLP Short Term Goal 5 (Week 1): Pt will utilize an increased vocal intensity at the word level with Max A multimodal cues.  SLP Short Term Goal 6 (Week 1): Pt will consume trials of ice chips with minimal overt s/s of aspiration with Max A mutlimodal cues.   Skilled Therapeutic Interventions:  Pt was seen for co-tx with OT targeting cognitive goals.  Upon arrival, pt was seated upright in wheelchair, awake, somewhat lethargic, and agreeable to participate in tx session.  Pt was noted with wet vocal quality secondary to decreased management of secretions and required mod instructional cues to clear throat.  SLP and OT also facilitated session with min assist instructional cues for finger occlusion to complete oral care with suctioning and toothette.  Pt recalled at least 2 details from previous therapy sessions with max question cues; therefore, SLP and OT reoriented pt to current goals and progress for each therapy discipline.  When engaged in a structured card game, pt was noted to sustain his attention for period of ~4-5 seconds with mod cuing for redirection.  Pt was noted with improved carryover within the setting of  the structured task with cuing faded to overall min assist multimodal cuing for redirection and initiation.    FIM:  Comprehension Comprehension Mode: Auditory Comprehension: 4-Understands basic 75 - 89% of the time/requires cueing 10 - 24% of the time Expression Expression: 2-Expresses basic 25 - 49% of the time/requires cueing 50 - 75% of the time. Uses single words/gestures. Social Interaction Social Interaction: 4-Interacts appropriately 75 - 89% of the time - Needs redirection for appropriate language or to initiate interaction. Problem Solving Problem Solving: 2-Solves basic 25 - 49% of the time - needs direction more than half the time to initiate, plan or complete simple activities Memory Memory: 2-Recognizes or recalls 25 - 49% of the time/requires cueing 51 - 75% of the time  Pain Pain Assessment Pain Assessment: No/denies pain Pain Score: 0-No pain   Therapy/Group: Other: Co-tx with OT KP  Jayton Popelka, Selinda Orion 09/09/2013, 4:52 PM

## 2013-09-09 NOTE — Progress Notes (Signed)
Physical Therapy Session Note  Patient Details  Name: Austin [name] MRN: [id number] Date of Birth: Aug 09, 1932  Today's Date: 09/09/2013 PT Individual Time: 0830-0930 PT Individual Time Calculation (min): 60 min   Short Term Goals: Week 1:  PT Short Term Goal 1 (Week 1): Patient will perform bed mobility with maxA x1. PT Short Term Goal 2 (Week 1): Patient will perform functional transfers with maxA x1. PT Short Term Goal 3 (Week 1): Patient will demonstrate sustained attention x10" in controlled environment with max cues.  Skilled Therapeutic Interventions/Progress Updates:  Pt. Received in large gym with OT present. Pt. Handoff in tilt-in-space w/c in semi-reclined position. Pt. Aware of therapist, greeted with handshake and acknowlegment.  1:1 (Therapist present as second helper to assist mobility) Treatment focused on cognitive remediation, functional transfers, NMReEd: postural control and alignment, weight bearing/shifting. Pt. Assist level is +2 A.  Treatment consisted of functional transfer w/c to mat total assist with verbal and tactile cues for initiation. Functional transfers included sit<>stand with +2A in 3 musketeers with lateral weight shifting working on pre-gait training with R forward/retro stepping and unweighted L LE. Cognition trials emphasis on conative remediation with color identification, number identification, number sequencing, basic math, categories/grouping while sitting EOM with min-max A secondary to pusher tendencies with cues for postural alignment/mid-line orientation with mirror for visual feedback.  Pain: Pt. Reports unrated pain with movement in L shoulder.  Pt. Condition post therapy session: positioned seated in w/c with all needs within reach; pt reoriented to call bed for assistance. RN reminded about tilting schedule for pt's w/c. quick release in use for pt. safety.   Therapy Documentation Precautions:  Precautions Precautions:  Fall;Cervical Precaution Comments: peg Required Braces or Orthoses: Cervical Brace Cervical Brace: Hard collar Other Brace/Splint: R wrist splint and L cock up splint Restrictions Weight Bearing Restrictions: Yes RUE Weight Bearing: Weight bearing as tolerated Other Position/Activity Restrictions: WBAT on Rt UE per ORTHO note  See FIM for current functional status  Therapy/Group: Individual Therapy  Juluis Mire 09/09/2013, 11:44 AM

## 2013-09-09 NOTE — Progress Notes (Signed)
Results of dopplers noted.  [name] now on bedrest.  Will consult IR in the AM for potential filter placement.  Meredith Staggers, MD, [place]

## 2013-09-09 NOTE — Progress Notes (Signed)
Note reviewed. Agree.  Pryor Ochoa RD, LDN Inpatient Clinical Dietitian Pager: [phone] After Hours Pager: [phone]

## 2013-09-09 NOTE — Progress Notes (Signed)
VASCULAR LAB PRELIMINARY  PRELIMINARY  PRELIMINARY  PRELIMINARY  Bilateral lower extremity venous duplex  completed.    Preliminary report:  Right:  Non occlusive DVT noted in the distal CFV and proximal FV.  Occlusive DVT noted in the gastrocnemius vein.  This is new since study of 8-14.  No evidence of superficial thrombosis.  No Baker's cyst.  Left: DVT still noted in the gastrocnemius vein.  No evidence of superficial thrombosis.  No Baker's cyst.   Austin [name], RVT 09/09/2013, 4:58 PM

## 2013-09-10 ENCOUNTER — Inpatient Hospital Stay (HOSPITAL_COMMUNITY): Payer: Self-pay | Admitting: Speech Pathology

## 2013-09-10 ENCOUNTER — Inpatient Hospital Stay (HOSPITAL_COMMUNITY): Payer: Medicare Other

## 2013-09-10 ENCOUNTER — Encounter (HOSPITAL_COMMUNITY): Payer: Self-pay

## 2013-09-10 ENCOUNTER — Ambulatory Visit (HOSPITAL_COMMUNITY): Payer: Self-pay | Admitting: Speech Pathology

## 2013-09-10 ENCOUNTER — Encounter (HOSPITAL_COMMUNITY): Payer: Self-pay | Admitting: Radiology

## 2013-09-10 ENCOUNTER — Inpatient Hospital Stay (HOSPITAL_COMMUNITY): Payer: Self-pay

## 2013-09-10 DIAGNOSIS — J189 Pneumonia, unspecified organism: Secondary | ICD-10-CM | POA: Diagnosis present

## 2013-09-10 DIAGNOSIS — J690 Pneumonitis due to inhalation of food and vomit: Secondary | ICD-10-CM | POA: Diagnosis present

## 2013-09-10 DIAGNOSIS — Z5189 Encounter for other specified aftercare: Secondary | ICD-10-CM

## 2013-09-10 DIAGNOSIS — D72829 Elevated white blood cell count, unspecified: Secondary | ICD-10-CM | POA: Diagnosis present

## 2013-09-10 DIAGNOSIS — S069XAA Unspecified intracranial injury with loss of consciousness status unknown, initial encounter: Secondary | ICD-10-CM

## 2013-09-10 DIAGNOSIS — S069X9A Unspecified intracranial injury with loss of consciousness of unspecified duration, initial encounter: Secondary | ICD-10-CM

## 2013-09-10 LAB — BASIC METABOLIC PANEL
Anion gap: 13 (ref 5–15)
BUN: 38 mg/dL — ABNORMAL HIGH (ref 6–23)
CO2: 26 mEq/L (ref 19–32)
Calcium: 9.8 mg/dL (ref 8.4–10.5)
Chloride: 106 mEq/L (ref 96–112)
Creatinine, Ser: 1.06 mg/dL (ref 0.50–1.35)
GFR calc Af Amer: 76 mL/min — ABNORMAL LOW (ref >90)
GFR calc non Af Amer: 65 mL/min — ABNORMAL LOW (ref >90)
Glucose, Bld: 117 mg/dL — ABNORMAL HIGH (ref 70–99)
Potassium: 4.7 mEq/L (ref 3.7–5.3)
Sodium: 145 mEq/L (ref 137–147)

## 2013-09-10 LAB — CBC
HCT: 42.7 % (ref 39.0–52.0)
Hemoglobin: 13.7 g/dL (ref 13.0–17.0)
MCH: 30.6 pg (ref 26.0–34.0)
MCHC: 32.1 g/dL (ref 30.0–36.0)
MCV: 95.5 fL (ref 78.0–100.0)
Platelets: 245 10*3/uL (ref 150–400)
RBC: 4.47 MIL/uL (ref 4.22–5.81)
RDW: 14.2 % (ref 11.5–15.5)
WBC: 9.9 10*3/uL (ref 4.0–10.5)

## 2013-09-10 LAB — GLUCOSE, CAPILLARY
Glucose-Capillary: 104 mg/dL — ABNORMAL HIGH (ref 70–99)
Glucose-Capillary: 104 mg/dL — ABNORMAL HIGH (ref 70–99)
Glucose-Capillary: 171 mg/dL — ABNORMAL HIGH (ref 70–99)
Glucose-Capillary: 92 mg/dL (ref 70–99)

## 2013-09-10 LAB — PROTIME-INR
INR: 1.05 (ref 0.00–1.49)
Prothrombin Time: 13.7 seconds (ref 11.6–15.2)

## 2013-09-10 MED ORDER — LIDOCAINE HCL 1 % IJ SOLN
INTRAMUSCULAR | Status: AC
  Filled 2013-09-10: qty 20

## 2013-09-10 MED ORDER — FENTANYL CITRATE 0.05 MG/ML IJ SOLN
INTRAMUSCULAR | Status: AC | PRN
  Administered 2013-09-10: 25 ug via INTRAVENOUS

## 2013-09-10 MED ORDER — MIDAZOLAM HCL 2 MG/2ML IJ SOLN
INTRAMUSCULAR | Status: AC | PRN
  Administered 2013-09-10: 1 mg via INTRAVENOUS

## 2013-09-10 MED ORDER — FENTANYL CITRATE 0.05 MG/ML IJ SOLN
INTRAMUSCULAR | Status: AC
  Filled 2013-09-10: qty 2

## 2013-09-10 MED ORDER — BETHANECHOL CHLORIDE 10 MG PO TABS
10.0000 mg | ORAL_TABLET | Freq: Three times a day (TID) | ORAL | Status: DC
  Administered 2013-09-10 – 2013-09-12 (×6): 10 mg via ORAL
  Filled 2013-09-10 (×10): qty 1

## 2013-09-10 MED ORDER — IOHEXOL 300 MG/ML  SOLN
100.0000 mL | Freq: Once | INTRAMUSCULAR | Status: AC | PRN
  Administered 2013-09-10: 1 mL via INTRAVENOUS

## 2013-09-10 MED ORDER — MIDAZOLAM HCL 2 MG/2ML IJ SOLN
INTRAMUSCULAR | Status: AC
  Filled 2013-09-10: qty 2

## 2013-09-10 NOTE — Progress Notes (Signed)
Physical Therapy Session Note  Patient Details  Name: JOVIAN TRIMBUR MRN: [id number] Date of Birth: [birth year]  Today's Date: 09/10/2013 PT Individual Time: 1430-1500 PT Individual Time Calculation (min): 30 min   Short Term Goals: Week 1:  PT Short Term Goal 1 (Week 1): Patient will perform bed mobility with maxA x1. PT Short Term Goal 2 (Week 1): Patient will perform functional transfers with maxA x1. PT Short Term Goal 3 (Week 1): Patient will demonstrate sustained attention x10" in controlled environment with max cues.  Skilled Therapeutic Interventions/Progress Updates:    Pt received supine in bed, agreeable to participate in therapy. Consulted w/ PA Pam Love prior to session and she advised no OOB activity including seated EOB. Pt seen for bed-level activity for tone/contracture management, attention, orientation. Therapist provided manual stretching for wrist extension, shoulder ER, shoulder flexion. Continue to note very high tone in pectoralis major limiting external rotation. Noted improved L wrist extension range vs AM, likely 2/2 having wrist in cock up splint all day. Attempted to have pt mirror RUE w/ therapist's movement of LUE, pt able to attend to task for 5-7 seconds before losing attention. Also attempted to have pt identify items in L visual field, noted decreased attention and ability to attend to L side vs AM session, likely 2/2 anesthesia and fatigue from DVT procedure. Discussed w/ wife difference between AM/PM session and that fatigue and decreased attention this PM is likely due to procedure and not sign of regression. Pt left supine in bed w/ all needs within reach w/ wife present.  Therapy Documentation Precautions:  Precautions Precautions: Fall;Cervical Precaution Comments: peg Required Braces or Orthoses: Cervical Brace Cervical Brace: Hard collar Other Brace/Splint: R wrist splint and L cock up splint Restrictions Weight Bearing Restrictions: Yes RUE  Weight Bearing: Weight bearing as tolerated Other Position/Activity Restrictions: WBAT on Rt UE per ORTHO note Vital Signs: Therapy Vitals Temp: 98.5 F (36.9 C) Temp src: Oral Pulse Rate: 102 Resp: 16 BP: 125/62 mmHg Patient Position (if appropriate): Lying Oxygen Therapy SpO2: 96 % Pain: Pain Assessment Pain Assessment: No/denies pain  See FIM for current functional status  Therapy/Group: Individual Therapy  Rada Hay Rada Hay, PT, DPT 09/10/2013, 4:08 PM

## 2013-09-10 NOTE — Consult Note (Signed)
HPI: Austin [name] is an 80 y.o. male with recent traumatic brain injury secondary to [place]. He is currently in inpt rehab. He is found to have developed acute LE DVT, despite being on prophylactic Lovenox. He cannot have full anticoagulation due to his recent head injury and risk of bleed. IR is requested to place IVC filter. Chart, PMHx, meds, labs reviewed.  Past Medical History:  Past Medical History  Diagnosis Date  . ADENOCARCINOMA, PROSTATE, GLEASON GRADE 5 01/21/2009  . ALLERGIC RHINITIS 01/14/2007  . COLONIC POLYPS, HX OF 01/14/2007  . ELEVATED PROSTATE SPECIFIC ANTIGEN 03/27/2008  . ESOPHAGITIS 01/14/2007  . HYPERLIPIDEMIA 01/14/2007  . HYPERTENSION 01/14/2007  . LIBIDO, DECREASED 01/15/2007  . Hypertension   . Hypercholesterolemia     Surgical History:  Past Surgical History  Procedure Laterality Date  . Hernia repair    . Prostate cryoablation    . Esophagogastroduodenoscopy N/A 08/15/2013    Procedure: ESOPHAGOGASTRODUODENOSCOPY (EGD);  Surgeon: Gwenyth Ober, MD;  Location: [facility];  Service: General;  Laterality: N/A;  . Peg placement N/A 09/02/2013    Procedure: PERCUTANEOUS ENDOSCOPIC GASTROSTOMY (PEG) PLACEMENT;  Surgeon: Gwenyth Ober, MD;  [facility] END[facility]COPY;  Service: General;  Laterality: N/A;    Family History:  Family History  Problem Relation Age of Onset  . Lung cancer Father   . Hypertension Other   . Arthritis      Social History:  reports that he has quit smoking. He does not have any smokeless tobacco history on file. He reports that he does not drink alcohol or use illicit drugs.  Allergies: No Known Allergies  Medications: Current facility-administered medications:acetaminophen (TYLENOL) tablet 325-650 mg, 325-650 mg, Per Tube, Q4H PRN, Bary Leriche, PA-C;  alum & mag hydroxide-simeth (MAALOX/MYLANTA) 200-200-20 MG/5ML suspension 30 mL, 30 mL, Per Tube, Q4H PRN, Bary Leriche, PA-C;  amantadine (SYMMETREL) solution 100 mg, 100 mg,  Per Tube, BID, Pamela S Love, PA-C, 100 mg at 09/09/13 2141 antiseptic oral rinse (CPC / CETYLPYRIDINIUM CHLORIDE 0.05%) solution 7 mL, 7 mL, Mouth Rinse, QID, Pamela S Love, PA-C, 7 mL at 09/10/13 0000;  bethanechol (URECHOLINE) tablet 10 mg, 10 mg, Oral, TID, Meredith Staggers, MD;  bisacodyl (DULCOLAX) suppository 10 mg, 10 mg, Rectal, Daily PRN, Ivan Anchors Love, PA-C;  chlorhexidine (PERIDEX) 0.12 % solution 15 mL, 15 mL, Mouth Rinse, BID, Pamela S Love, PA-C, 15 mL at 09/10/13 3903 cholestyramine light (PREVALITE) packet 4 g, 4 g, Oral, BID, Pamela S Love, PA-C, 4 g at 09/09/13 2142;  diphenhydrAMINE (BENADRYL) 12.5 MG/5ML elixir 12.5-25 mg, 12.5-25 mg, Per Tube, Q6H PRN, Bary Leriche, PA-C;  enoxaparin (LOVENOX) injection 40 mg, 40 mg, Subcutaneous, Q24H, Pamela S Love, PA-C, 40 mg at 09/09/13 0848;  feeding supplement (PIVOT 1.5 CAL) liquid 360 mL, 360 mL, Per Tube, QID, Pamela S Love, PA-C, 360 mL at 09/09/13 2146 free water 200 mL, 200 mL, Per Tube, 5 X Daily, Pamela S Love, PA-C, 200 mL at 09/10/13 0600;  insulin aspart (novoLOG) injection 0-15 Units, 0-15 Units, Subcutaneous, TID WC, Bary Leriche, PA-C, 2 Units at 09/09/13 1719;  insulin aspart (novoLOG) injection 0-5 Units, 0-5 Units, Subcutaneous, QHS, Pamela S Love, PA-C, 2 Units at 09/06/13 2233;  lidocaine (XYLOCAINE) 2 % jelly, , Topical, PRN, Bary Leriche, PA-C metoprolol tartrate (LOPRESSOR) 25 mg/10 mL oral suspension 12.5 mg, 12.5 mg, Per Tube, BID, Pamela S Love, PA-C, 12.5 mg at 09/09/13 2141;  multivitamin liquid 5 mL, 5 mL,  Per Tube, Daily, Jacquelynn Cree, PA-C, 5 mL at 09/09/13 0846;  ondansetron (ZOFRAN) injection 4 mg, 4 mg, Intravenous, Q6H PRN, Evlyn Kanner Love, PA-C;  ondansetron (ZOFRAN) tablet 4 mg, 4 mg, Oral, Q6H PRN, Evlyn Kanner Love, PA-C pantoprazole sodium (PROTONIX) 40 mg/20 mL oral suspension 40 mg, 40 mg, Per Tube, Daily, Jacquelynn Cree, PA-C, 40 mg at 09/10/13 0805;  tiZANidine (ZANAFLEX) tablet 2 mg, 2 mg, Oral, TID, Erick Colace, MD, 2 mg at 09/09/13 2141;  traMADol (ULTRAM) tablet 50 mg, 50 mg, Per Tube, Q6H PRN, Jacquelynn Cree, PA-C, 50 mg at 09/06/13 0833   ROS: See HPI for pertinent findings, otherwise complete 10 system review negative.  Physical Exam: Blood pressure 119/64, pulse 79, temperature 98.8 F (37.1 C), temperature source Oral, resp. rate 18, weight 166 lb 10.7 oz (75.6 kg), SpO2 98.00%. Awake and Alert ENT: unremarkable airway Lungs: CTA without w/r/r Heart: Regular    Labs: CBC  Recent Labs  09/08/13 0452  WBC 13.1*  HGB 11.5*  HCT 34.7*  PLT 331   MET  Recent Labs  09/08/13 0452  NA 141  K 5.1  CL 104  CO2 24  GLUCOSE 126*  BUN 44*  CREATININE 1.00  CALCIUM 9.6    Dg Cervical Spine With Flex & Extend  09/09/2013   CLINICAL DATA:  Neck pain.  EXAM: CERVICAL SPINE COMPLETE WITH FLEXION AND EXTENSION VIEWS  COMPARISON:  CT scan of September 08, 2013.  FINDINGS: Only the first 6 cervical vertebral bodies are visualized on these images. As noted previously, anterior osteophyte formation is noted consistent with diffuse idiopathic skeletal hyperostosis. No definite fracture or spondylolisthesis is noted. No change in vertebral body alignment is noted on the flexion or extension views  IMPRESSION: No definite evidence of fracture or spondylolisthesis seen involving the first 6 cervical vertebral bodies.   Electronically Signed   By: Roque Lias M.D.   On: 09/09/2013 10:55   Dg Abd 1 View  09/08/2013   CLINICAL DATA:  Peg tube placement.  EXAM: ABDOMEN - 1 VIEW  COMPARISON:  April 16, 2009.  FINDINGS: No evidence of bowel obstruction or ileus is noted. Percutaneous gastrostomy tube is noted with tip in the gastric lumen. Contrast filling of the gastric lumen is noted without definite evidence of perforation or extravasation.  IMPRESSION: Distal tip of percutaneous gastrostomy tube is noted within the gastric lumen.   Electronically Signed   By: Roque Lias M.D.   On: 09/08/2013  13:51   Ct Cervical Spine Wo Contrast  09/08/2013   CLINICAL DATA:  Trauma.  Question occult fracture.  EXAM: CT CERVICAL SPINE WITHOUT CONTRAST  TECHNIQUE: Multidetector CT imaging of the cervical spine was performed without intravenous contrast. Multiplanar CT image reconstructions were also generated.  COMPARISON:  Plain films 09/06/2013  FINDINGS: Large anterior flowing osteophytes compatible with diffuse idiopathic skeletal hyperostosis (DISH). Partial fusion across the C6-7 disc space. Fusion across the C2-3 facets bilaterally and the right C3-4 facet. Degenerative changes elsewhere throughout the process.  Alignment is normal. Prevertebral soft tissues are normal. No fracture. No malalignment. No epidural or paraspinal hematoma.  Secretions/ mucus noted within the pharynx.  IMPRESSION: No acute bony abnormality.  DISH throughout the cervical spine.   Electronically Signed   By: Charlett Nose M.D.   On: 09/08/2013 16:54    Assessment/Plan: Recent TBI from [place] Acute LE DVT with contraindication to full anticoagulation Discussed placement of IVC filter with pt and  wife. Procedure, risks, complications, use of sedation, and possible retrieval of filter at a later date, all discussed Labs and imaging reviewed. Consent to be signed by wife.  Ascencion Dike PA-C 09/10/2013, 9:08 AM

## 2013-09-10 NOTE — Procedures (Signed)
Procedure:  IVC filter placement Access:  Right IJ vein Findings:  IVC widely patent and normal in caliber.  [place] IVC filter placed in infrarenal IVC.

## 2013-09-10 NOTE — Progress Notes (Addendum)
Occupational Therapy Session Note  Patient Details  Name: Austin [name] MRN: [id number] Date of Birth: 08-Jul-1932  Today's Date: 09/10/2013 OT Individual Time: 0900-1000 and 1316-1404 OT Individual Time Calculation (min): 60 min and 48 min    Short Term Goals: Week 1:  OT Short Term Goal 1 (Week 1): Pt will sit EOB for 15 min during functional task with max assist OT Short Term Goal 2 (Week 1): Patient will visual scan to left to locate 2/3 self-care items with max multimodal cues OT Short Term Goal 3 (Week 1): Patient will follow motor command within 20 seconds during self-care task OT Short Term Goal 4 (Week 1): Patient initiate anterior weight shift in preparation for functional transfer with max cues.   Skilled Therapeutic Interventions/Progress Updates:   Session 1: Pt seen for 1:1 OT session with focus on visual attention to left, cognitive remediation, and LUE tone/contracture management. Pt on bed rest per MD orders. Pt received supine in bed demonstrating increased awareness of LUE stating "my arm is starting to feel better" while touching L wrist. Pt with no AROM noted, however demonstrated improved PROM in all joints. Most tone noted in internal rotators. Engaged in therapeutic conversation with pt making eye contact with therapist on level side consistently without cues, holding L gaze up to 8 seconds. Pt recalled 2 events from previous session with increased time and recalled 2/3 therapies and each disciplines goals of therapy with mod cues. Pt recalled reason for bed rest without cues. Pt verbalized feeling worried about wife being "too stressed." Provided coping and education. Informed CSW of pt's stress and requested neuropsyc consult. At end of session, pt left supine in bed with HOB elevated and all needs in reach.  Session 2: Pt seen for 1:1 OT session with focus on attention to task, visual attention to L, command following, and initiation. Pt received supine in bed and  remained on bedrest per MD orders. Engaged in therapeutic activity of Connect 4 game. Pt with increased fatigue this afternoon demonstrating focused attention 4-5 seconds with max multimodal cues for redirection. Pt with increased difficulty scanning to left (likely d/t fatigue) requiring max multimodal cues to scan slight past midline to locate chips in game. Pt initially required max cues for initiation, progressing to mod cues halfway through session, then required max cues again with fatigue. Pt noted to have increased visual perceptual deficits when reaching for coin in right visual field. Family present at end of session, asking appropriate questions. Encouraged family to interact with pt on left side at time to facilitate visual scanning and overall attention to L. Family agreeable to this. Pt left supine in bed with HOB slightly elevated and all needs in reach. Pt missing 12 min of skilled OT secondary to nursing care.    Therapy Documentation Precautions:  Precautions Precautions: Fall;Cervical Precaution Comments: peg Required Braces or Orthoses: Cervical Brace Cervical Brace: Hard collar Other Brace/Splint: R wrist splint and L cock up splint Restrictions Weight Bearing Restrictions: Yes RUE Weight Bearing: Weight bearing as tolerated Other Position/Activity Restrictions: WBAT on Rt UE per ORTHO note General:   Vital Signs: Therapy Vitals Temp: 98.1 F (36.7 C) Pulse Rate: 93 Resp: 18 BP: 122/72 mmHg Oxygen Therapy SpO2: 98 % Pain: Pain Assessment Pain Assessment: No/denies pain  See FIM for current functional status  Therapy/Group: Individual Therapy  Duayne Cal 09/10/2013, 12:59 PM
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
## 2013-09-10 NOTE — Progress Notes (Signed)
Physical Therapy Session Note  Patient Details  Name: Austin [name] MRN: [id number] Date of Birth: Apr 19, 1932  Today's Date: 09/10/2013 PT Co-Treatment Time: 0830 (co-tx w/ SLP 8-830)-0900 PT Co-Treatment Time Calculation (min): 30 min  Short Term Goals: Week 1:  PT Short Term Goal 1 (Week 1): Patient will perform bed mobility with maxA x1. PT Short Term Goal 2 (Week 1): Patient will perform functional transfers with maxA x1. PT Short Term Goal 3 (Week 1): Patient will demonstrate sustained attention x10" in controlled environment with max cues.  Skilled Therapeutic Interventions/Progress Updates:    Co-tx w/ SLP for shared cognitive goals. Per RN, PA, MD pt under strict bedrest orders this AM, no EOB and no LE movement due to acute DVT. Session focused on divided attention, attention to L side, LUE tone/contracture management. Therapist provided stretching to pt's L arm while pt was engaged in cognitive task w/ SLP at midline. Pt able to attend to SLP's task for ~10 seconds at a time before being distracted by L arm, but easily redirected back to task. Assessed cervical rotation, grossly 20 degrees to L and 0 to R w/ hard end feel, ? Of muscle guarding vs contracture. Noted pt's wrist to be in strong flexion tone at beginning of session, able to achieve extension w/ passive stretching. Therapist fit pt's cock up wrist splint on L wrist for tone management. Noted strong internal rotation tone/tight pectoralis, able to achieve neutral rotation w/ PNF techniques and deep pressure on pec major tendon. Pt left supine in bed in care of OT w/ all needs within reach.  Therapy Documentation Precautions:  Precautions Precautions: Fall;Cervical Precaution Comments: peg Required Braces or Orthoses: Cervical Brace Cervical Brace: Hard collar Other Brace/Splint: R wrist splint and L cock up splint Restrictions Weight Bearing Restrictions: Yes RUE Weight Bearing: Weight bearing as tolerated Other  Position/Activity Restrictions: WBAT on Rt UE per ORTHO note Vital Signs: Therapy Vitals Pulse Rate: 92 Resp: 27 BP: 131/71 mmHg Oxygen Therapy SpO2: 98 % Pain: Pain Assessment Pain Assessment: No/denies pain  See FIM for current functional status  Therapy/Group: Individual Therapy  Rada Hay Rada Hay, PT, DPT 09/10/2013, 12:16 PM
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
## 2013-09-10 NOTE — Consult Note (Signed)
Agree.  For IVC filter placement today.

## 2013-09-10 NOTE — Progress Notes (Signed)
[id number] Verbal orders from [facility], [place] to hold all medications and tube feedings this AM.  Patient is preparing to transfer to procedural area today for DVT.

## 2013-09-10 NOTE — Progress Notes (Signed)
Speech Language Pathology Daily Session Note  Patient Details  Name: Austin [name] MRN: [id number] Date of Birth: [birth year]  Today's Date: 09/10/2013 SLP Co-Treatment Time: 0800-0830 SLP Co-Treatment Time Calculation (min): 30 min  Short Term Goals: Week 1: SLP Short Term Goal 1 (Week 1): Pt will demonstrate focused attention to functional tasks for 30 seconds with Max multimodal cues.  SLP Short Term Goal 2 (Week 1): Pt will follow basic 1 step commands with 75% of opportunities with Max A multimodal cues.  SLP Short Term Goal 3 (Week 1): Pt will identify 1 cognitive and 1 physical deficit with Max A multimodal cues.  SLP Short Term Goal 4 (Week 1): Pt will orient to time with utilization of external memory aids with Max A multimodal cues.  SLP Short Term Goal 5 (Week 1): Pt will utilize an increased vocal intensity at the word level with Max A multimodal cues.  SLP Short Term Goal 6 (Week 1): Pt will consume trials of ice chips with minimal overt s/s of aspiration with Max A mutlimodal cues.   Skilled Therapeutic Interventions:  Pt was seen for skilled PT/ST co tx targeting cognitive goals.  Per report from RN, PA, and MD, pt on strict bedrest, must remain partially reclined with head of bed elevated no greater than 45 degrees due to blood clots in lower extremity.  Additionally, pt strict NPO for procedure, therefore PO trials held on this date.  SLP facilitated session with supervision-min cues for finger occlusion and left attention to complete oral care via wall suction.  SLP also engaged pt in a basic money management task for counting and sorting coins and bills with pt requiring overall min-mod assist to complete for >75% accuracy. Pt able to selectively attend to task for periods of 5-7 seconds while PT completed stretching exercises with min cues to redirect.   Overall, pt benefited from mod assist question cues for carryover within session of targeted PT/ST recommendations (i.e.  Looking at left upper extremity, clear throat with wet vocal quality, keep leg elevated).  Continue per current plan of care.    FIM:  Comprehension Comprehension Mode: Auditory Comprehension: 4-Understands basic 75 - 89% of the time/requires cueing 10 - 24% of the time Expression Expression Mode: Verbal Expression: 2-Expresses basic 25 - 49% of the time/requires cueing 50 - 75% of the time. Uses single words/gestures. Social Interaction Social Interaction: 4-Interacts appropriately 75 - 89% of the time - Needs redirection for appropriate language or to initiate interaction. Problem Solving Problem Solving: 2-Solves basic 25 - 49% of the time - needs direction more than half the time to initiate, plan or complete simple activities Memory Memory: 3-Recognizes or recalls 50 - 74% of the time/requires cueing 25 - 49% of the time  Pain Pain Assessment Pain Assessment: No/denies pain  Therapy/Group: Other: Co-tx with PT JG  Rayquan Amrhein, Selinda Orion 09/10/2013, 10:19 AM

## 2013-09-10 NOTE — Progress Notes (Signed)
SLP Cancellation Note  Patient Details Name: AHNAF GORMAN MRN: [id number] DOB: [birth year]   Cancelled treatment:       Pt missed 60 minutes of skilled SLP intervention due to off unit for placement of IVC filter.   Weston Anna, [place], [place]                                                                                                [place], [place] 09/10/2013, 12:01 PM

## 2013-09-11 ENCOUNTER — Inpatient Hospital Stay (HOSPITAL_COMMUNITY): Payer: Medicare Other | Admitting: *Deleted

## 2013-09-11 ENCOUNTER — Inpatient Hospital Stay (HOSPITAL_COMMUNITY): Payer: Medicare Other | Admitting: Speech Pathology

## 2013-09-11 ENCOUNTER — Encounter (HOSPITAL_COMMUNITY): Payer: Self-pay

## 2013-09-11 ENCOUNTER — Inpatient Hospital Stay (HOSPITAL_COMMUNITY): Payer: Self-pay | Admitting: *Deleted

## 2013-09-11 ENCOUNTER — Inpatient Hospital Stay (HOSPITAL_COMMUNITY): Payer: Self-pay

## 2013-09-11 LAB — URINE CULTURE
Colony Count: NO GROWTH
Culture: NO GROWTH

## 2013-09-11 LAB — GLUCOSE, CAPILLARY
Glucose-Capillary: 125 mg/dL — ABNORMAL HIGH (ref 70–99)
Glucose-Capillary: 160 mg/dL — ABNORMAL HIGH (ref 70–99)

## 2013-09-11 NOTE — Progress Notes (Signed)
Recreational Therapy Session Note  Patient Details  Name: Austin [name] MRN: [id number] Date of Birth: [birth year] Today's Date: 09/11/2013 LATE ENTRY for 09/10/2013 Pt & family participated in animal assisted activity at bed side/bed level with superviison. [place] 09/11/2013, 11:25 AM

## 2013-09-11 NOTE — Progress Notes (Signed)
Occupational Therapy Weekly Progress Note  Patient Details  Name: Austin [name] MRN: [id number] Date of Birth: 1932/02/18  Beginning of progress report period: September 04, 2013 End of progress report period: September 11, 2013  Today's Date: 09/11/2013 OT Individual Time: 0930-1030 and 1400-1500 OT Individual Time Calculation (min): 60 min and 60 min    Patient has met 2 of 4 short term goals.  Patient has made progress during this reporting period towards all STGs. Patient continues to required +2 assist for functional transfers and sit<>stand during self-care tasks. Patient demonstrates increased awareness, initiation, and attention to left during self-care tasks and therapeutic activities. Patient's greatest barriers are increased tone in LUE/LLE and pusher tendencies, requiring increased assistance for postural control in sitting and standing. Patient also has limited sustained attention in controlled environment 5-7 seconds, requiring max cues to redirect. Patient is currently demonstrating behaviors consistent with [place] Level V.    Patient continues to demonstrate the following deficits: poor postural control in sitting and standing, decreased activity tolerance, decreased awareness, decreased attention, L inattention, decreased strength, decreased coordination, decreased activity tolerance, decreased functional use of LUE/LLE, decreased balance strategies, impaired overall cognition  and therefore will continue to benefit from skilled OT intervention to enhance overall performance with BADL, Reduce care partner burden and activity tolerance, ability to compensate for deficits, functional use of LUE/LLE, postural control, and awareness.  Patient progressing toward long term goals..  Continue plan of care.  OT Short Term Goals Week 1:  OT Short Term Goal 1 (Week 1): Pt will sit EOB for 15 min during functional task with max assist OT Short Term Goal 1 - Progress (Week 1): Met OT Short  Term Goal 2 (Week 1): Patient will visual scan to left to locate 2/3 self-care items with max multimodal cues OT Short Term Goal 2 - Progress (Week 1): Met OT Short Term Goal 3 (Week 1): Patient will follow motor command within 20 seconds during self-care task OT Short Term Goal 3 - Progress (Week 1): Partly met OT Short Term Goal 4 (Week 1): Patient initiate anterior weight shift in preparation for functional transfer with max cues.  OT Short Term Goal 4 - Progress (Week 1): Not met  Skilled Therapeutic Interventions/Progress Updates:    Session 1: Pt seen for ADL retraining with focus on initiation, postural control in sitting and standing, functional transfers, and attention. Pt received sitting in w/c. Transferred w/c>rolling shower chair with max assist +2. Engaged in bathing at shower level with pt initiating washing face when presented wash cloth. Pt required mod cues for assisting with ~60% of bathing. Hand over hand assist provided to LUE when washing RUE with cues to visually attend to LUE. Pt completed dressing from w/c. Completed sit<>stand 4-5x with +2 assist and emphasis on postural control in standing with use of mirror and verbal cues. Pt demonstrated increased pushing with RUE in standing and sitting, requiring max cues to correct. Pt demonstrated sustained attention 6-7 seconds during therapy session. Engaged in therapeutic conversation with pt recalling 2 visitors from yesterday. Pt fatigued at end of session and left in tilt-in-space w/c with all needs in reach. Pt verbalized and demonstrated correct use of call light.  Session 2: Pt seen for 1:1 OT session with focus on postural control in standing, sustained attention, LUE tone/contracture management, and cognitive remediation. Pt received sitting in w/c. Engaged in therapeutic conversation with pt making eye contact with therapist on L side 75% of time. Engaged in therapeutic activities  in standing frame 16 min and 13 min. Began  playing connect 4 game with max cues for new learning task. Pt demonstrating improved initiation during game. Engaged in reaching task (in standing frame) to right side to initiate and facilitate weight shift to improve midline orientation. Pt initiated weight shift ~25% of time and required manual facilitation to complete and sustain weight shift. Used mirror as visual feedback to assist with postural control as well. Returned to room and provided LUE PROM and stretching for tone/contracture management. Pt with improved wrist extension, shoulder flexion, and slight external rotation. Patient continues to have high tone in pec major muscles, limiting external rotation. Pt left sitting in tilt-in-space w/c with all needs in reach.   Therapy Documentation Precautions:  Precautions Precautions: Fall;Cervical Precaution Comments: peg Required Braces or Orthoses: Cervical Brace Cervical Brace: Hard collar Other Brace/Splint: R wrist splint and L cock up splint Restrictions Weight Bearing Restrictions: Yes RUE Weight Bearing: Weight bearing as tolerated Other Position/Activity Restrictions: WBAT on Rt UE per ORTHO note General:   Vital Signs:   Pain: No report of pain during therapy sessions.   See FIM for current functional status  Therapy/Group: Individual Therapy  Duayne Cal 09/11/2013, 10:56 AM

## 2013-09-11 NOTE — Progress Notes (Signed)
Chaplain Note: Chaplain attempted a follow up visit with patient and family, since his recent unit transfer. Patient was working with Physical Therapy at the time. No family present. Will follow up at a later time.  Sheryn Bison, Chaplain
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
## 2013-09-11 NOTE — Progress Notes (Signed)
Speech Language Pathology Weekly Progress and Session Note  Patient Details  Name: Austin [name] MRN: [id number] Date of Birth: 1932-11-06  Beginning of progress report period: September 04, 2013 End of progress report period: September 11, 2013  Today's Date: 09/11/2013 SLP Individual Time: 1100-1200 SLP Individual Time Calculation (min): 60 min  Short Term Goals: Week 1: SLP Short Term Goal 1 (Week 1): Pt will demonstrate focused attention to functional tasks for 30 seconds with Max multimodal cues.  SLP Short Term Goal 1 - Progress (Week 1): Met SLP Short Term Goal 2 (Week 1): Pt will follow basic 1 step commands with 75% of opportunities with Max A multimodal cues.  SLP Short Term Goal 2 - Progress (Week 1): Met SLP Short Term Goal 3 (Week 1): Pt will identify 1 cognitive and 1 physical deficit with Max A multimodal cues.  SLP Short Term Goal 3 - Progress (Week 1): Met SLP Short Term Goal 4 (Week 1): Pt will orient to time with utilization of external memory aids with Max A multimodal cues.  SLP Short Term Goal 4 - Progress (Week 1): Met SLP Short Term Goal 5 (Week 1): Pt will utilize an increased vocal intensity at the word level with Max A multimodal cues.  SLP Short Term Goal 5 - Progress (Week 1): Met SLP Short Term Goal 6 (Week 1): Pt will consume trials of ice chips with minimal overt s/s of aspiration with Max A mutlimodal cues.  SLP Short Term Goal 6 - Progress (Week 1): Met    New Short Term Goals: Week 2: SLP Short Term Goal 1 (Week 2): Pt will demonstrate sustained attention to functional tasks for 2 minutes with Max multimodal cues.  SLP Short Term Goal 2 (Week 2): Pt will initiate functional tasks with Mod A multimodal cues.  SLP Short Term Goal 3 (Week 2): Pt will identify 2 cognitive and 2 physical deficit with Mod A multimodal cues.  SLP Short Term Goal 4 (Week 2): Pt will orient to time with utilization of external memory aids with Mod A multimodal cues.  SLP Short  Term Goal 5 (Week 2): Pt will utilize an increased vocal intensity at the word level with Mod A multimodal cues.  SLP Short Term Goal 6 (Week 2): Pt will consume trials of ice chips with minimal overt s/s of aspiration with Mod  A mutlimodal cues with 50% of trials.   Weekly Progress Updates: Patient has made functional gains and has met 6 of 6 STG's this reporting period due to increased attention, awareness, ability to follow 1 step commands, working memory, orientation and swallow initiation. Currently, pt is demonstrating behaviors consistent with a Rancho Level V and requires overall Max A to perform functional and familiar tasks.  Patient is also demonstrating increased swallow function with increased lingual manipulation and swallow initiation (45-60 seconds) with ice chip trials, however, pt continues to demonstrate consistent overt s/s of aspiration with decreased management of secretions intermittently.  Pt continues to be dysphonic and requires Max A multimodal cues for increased vocal intensity at the word level. Patient/family education ongoing and patient would benefit from continued skilled SLP intervention to maximize his cognitive-linguistic and swallowing function and overall functional independence prior to discharge.    Intensity: Minumum of 1-2 x/day, 30 to 90 minutes Frequency: 5 out of 7 days Duration/Length of Stay: 4 weeks Treatment/Interventions: Cognitive remediation/compensation;Cueing hierarchy;Environmental controls;Internal/external aids;Speech/Language facilitation;Therapeutic Activities;Patient/family education;Functional tasks;Dysphagia/aspiration precaution training   Daily Session Skilled Therapeutic Interventions: Skilled treatment  session focused on dysphagia, speech and cognitive goals. SLP facilitated session by providing supervision and visual cues for finger occlusion to complete oral care via suction toothbrush. Pt consumed trials of ice chips and demonstrate  minimally increased lingual/oral manipulation and swallow initiation (45-60 seconds), however, pt demonstrated overt s/s of aspiration with 50% of trials. Pt continues to demonstrate dysphonia and requires Max A multimodal cues for increased vocal intensity at the word level. Pt's speech intelligibility is also impacted by decreased management of secretions and a constant wet vocal quality with inability to clear with a cough/throat clear. Pt recalled events from morning therapy session with Mod question cues and was oriented X 4 with supervision question cues with independent use of external memory aids. Pt also identified 1 physical and 1 cognitive deficit with Max A multimodal cues. Pt independently verbalized need to use the bathroom and was transferred to the commode with total +2 assist. Pt required Max multimodal cues for sustained attention to task but was successful with having a bowel movement. Pt transferred back to his tilt-in-space wheelchair and was left semi-reclined with quick release belt in place and all needs within reach. RN also made aware. Continue with current plan of care.  FIM:  Comprehension Comprehension Mode: Auditory Comprehension: 4-Understands basic 75 - 89% of the time/requires cueing 10 - 24% of the time Expression Expression Mode: Verbal Expression: 2-Expresses basic 25 - 49% of the time/requires cueing 50 - 75% of the time. Uses single words/gestures. Social Interaction Social Interaction: 2-Interacts appropriately 25 - 49% of time - Needs frequent redirection. Problem Solving Problem Solving: 2-Solves basic 25 - 49% of the time - needs direction more than half the time to initiate, plan or complete simple activities Memory Memory: 3-Recognizes or recalls 50 - 74% of the time/requires cueing 25 - 49% of the time FIM - Eating Eating Activity: 4: Help with managing cup/glass;4: Help with picking up utensils;4: Helper occasionally brings food to mouth Pain No/Denies  Pain  Therapy/Group: Individual Therapy  Keyani Rigdon 09/11/2013, 12:11 PM

## 2013-09-11 NOTE — Plan of Care (Signed)
Problem: RH SKIN INTEGRITY Goal: RH STG SKIN FREE OF INFECTION/BREAKDOWN Free of any New skin breakdown  Outcome: Not Progressing Patient with thick yellow discharge from peg tube site and left shoulder. Cleaned and changed dressing to peg site  And left shoulder. adm

## 2013-09-11 NOTE — Progress Notes (Signed)
Recreational Therapy Session Note  Patient Details  Name: Austin [name] MRN: [id number] Date of Birth: 1932-05-20 Today's Date: 09/11/2013  Pain: no c/o, c/o "tightness" in LUE/shoulder Skilled Therapeutic Interventions/Progress Updates: Session focused on activity tolerance, standing tolerance/balance, weight shifting, maintaining midline, selective attention, problem solving, visual scanning, & simple math during co-treat with PT.  Pt stood in standing frame in front of mirror 25 min x1 & 6 min x1 reaching to right for cards to play Blackjack card game, Focus on weight shifting to right, decreased pushing to the left & cognition.  Pt required min cues to perform simple math. Therapy/Group: Co-Treatment  Lynze Reddy 09/11/2013, 1:13 PM
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
## 2013-09-11 NOTE — Progress Notes (Signed)
Physical Therapy Weekly Progress Note  Patient Details  Name: Austin [name] MRN: [id number] Date of Birth: [birth year]  Beginning of progress report period: September 04, 2013 End of progress report period: September 11, 2013  Today's Date: 09/11/2013 PT Individual Time: 0800-0900 and [id number] PT Individual Time Calculation (min): 60 min and 20 min (missed 10 min)  Patient has met 0 of 3 short term goals consistently. Patient has met each goal, however, does not perform at these levels consistently. Patient has made good progress during first week on rehab. Patient with much improved initiation and command following over the last week and is able to follow simple one and two step commands in controlled environment. Patient's greatest barriers at this point are his pusher tendencies, which impact his postural control and overall functional mobility, and poor sustained attention. Patient is currently demonstrating behaviors consistent with Rancho Level V.  Patient continues to demonstrate the following deficits: poor activity tolerance, decreased sitting and standing balance, poor balance strategies, decreased postural control secondary to pusher tendencies, decreased functional use of L UE secondary to hemiplegia, decreased functional use of R UE secondary to pusher tendencies, decreased functional use of R LE secondary to pusher tendencies, decreased sustained attention, decreased emergent awareness, decreased coordination, and therefore will continue to benefit from skilled PT intervention to enhance overall performance with activity tolerance, balance, postural control, ability to compensate for deficits, functional use of  right upper extremity, right lower extremity and left upper extremity, attention, awareness, coordination and knowledge of precautions.  Patient progressing toward long term goals..  Continue plan of care.  PT Short Term Goals Week 1:  PT Short Term Goal 1 (Week 1): Patient  will perform bed mobility with maxA x1. PT Short Term Goal 1 - Progress (Week 1): Progressing toward goal PT Short Term Goal 2 (Week 1): Patient will perform functional transfers with maxA x1. PT Short Term Goal 2 - Progress (Week 1): Progressing toward goal PT Short Term Goal 3 (Week 1): Patient will demonstrate sustained attention x10" in controlled environment with max cues. PT Short Term Goal 3 - Progress (Week 1): Progressing toward goal Week 2:  PT Short Term Goal 1 (Week 2): Patient will perform bed mobility with maxA x1. PT Short Term Goal 2 (Week 2): Patient will perform functional transfers with maxA x1. PT Short Term Goal 3 (Week 2): Patient will demonstrate sustained attention x10" in controlled environment with max cues.  Skilled Therapeutic Interventions/Progress Updates:    AM Session: Patient received supine in bed. Session focused on functional transfers, standing tolerance, remediation of pusher tendencies, and cognitive remediation. Patient requires totalA for supine>sit and +2 for squat pivot transfer bed>wheelchair secondary to severe pusher tendencies, pushing L and posterior. Unable to facilitate anterior weight shift without assistance from second helper. Standing frame in front of mirror for visual feedback 1x25' and 1x6' with focus on reaching to the R with R UE for cards to play card game to facilitate weight shift to the R and midline orientation, manual facilitation to assist with weight shift, but patient progresses to no need for facilitation. Black jack card game with emphasis on basic math and patient requires min cues for accuracy with math skills. Patient returned to room and left sitting in tilt-n-space wheelchair with seatbelt donned and all needs within reach.  PM Session: Patient received sitting in wheelchair. Session focused on anterior weight shifts and midline orientation sitting in wheelchair. Emphasis on decreased use of R UE secondary to pushing  tendencies.  Patient with poor awareness of midline and difficulty achieving without facilitation from therapist. Patient left sitting in wheelchair as he did not wish to return to bed. All needs within reach and seatbelt donned.  Therapy Documentation Precautions:  Precautions Precautions: Fall;Cervical Precaution Comments: peg Required Braces or Orthoses: Cervical Brace Cervical Brace: Hard collar Other Brace/Splint: R wrist splint and L cock up splint Restrictions Weight Bearing Restrictions: Yes RUE Weight Bearing: Weight bearing as tolerated Other Position/Activity Restrictions: WBAT on Rt UE per ORTHO note General: PT Amount of Missed Time (min): 10 Minutes PT Missed Treatment Reason: Patient fatigue Pain:  No/denies pain Locomotion : Ambulation Ambulation/Gait Assistance: Not tested (comment)   See FIM for current functional status  Therapy/Group: Individual Therapy and Co-Treatment with Rec Therapy for AM session  Lillia Abed. Jonta Gastineau, PT, DPT 09/11/2013, 4:35 PM

## 2013-09-12 ENCOUNTER — Inpatient Hospital Stay (HOSPITAL_COMMUNITY): Payer: Self-pay

## 2013-09-12 ENCOUNTER — Encounter (HOSPITAL_COMMUNITY): Payer: Self-pay

## 2013-09-12 ENCOUNTER — Ambulatory Visit (HOSPITAL_COMMUNITY): Payer: Self-pay | Admitting: *Deleted

## 2013-09-12 ENCOUNTER — Inpatient Hospital Stay (HOSPITAL_COMMUNITY): Payer: Medicare Other | Admitting: Speech Pathology

## 2013-09-12 ENCOUNTER — Inpatient Hospital Stay (HOSPITAL_COMMUNITY): Payer: Self-pay | Admitting: *Deleted

## 2013-09-12 LAB — GLUCOSE, CAPILLARY
Glucose-Capillary: 191 mg/dL — ABNORMAL HIGH (ref 70–99)
Glucose-Capillary: 133 mg/dL — ABNORMAL HIGH (ref 70–99)
Glucose-Capillary: 115 mg/dL — ABNORMAL HIGH (ref 70–99)
Glucose-Capillary: 161 mg/dL — ABNORMAL HIGH (ref 70–99)
Glucose-Capillary: 92 mg/dL (ref 70–99)
Glucose-Capillary: 180 mg/dL — ABNORMAL HIGH (ref 70–99)

## 2013-09-12 MED ORDER — BETHANECHOL CHLORIDE 25 MG PO TABS
25.0000 mg | ORAL_TABLET | Freq: Three times a day (TID) | ORAL | Status: DC
  Administered 2013-09-12 – 2013-09-17 (×14): 25 mg via ORAL
  Filled 2013-09-12 (×19): qty 1

## 2013-09-12 NOTE — Progress Notes (Signed)
Speech Language Pathology Daily Session Note  Patient Details  Name: Austin [name] MRN: [id number] Date of Birth: [birth year]  Today's Date: 09/12/2013 SLP Individual Time: 0800-0900 SLP Individual Time Calculation (min): 60 min  Short Term Goals: Week 2: SLP Short Term Goal 1 (Week 2): Pt will demonstrate sustained attention to functional tasks for 2 minutes with Max multimodal cues.  SLP Short Term Goal 2 (Week 2): Pt will initiate functional tasks with Mod A multimodal cues.  SLP Short Term Goal 3 (Week 2): Pt will identify 2 cognitive and 2 physical deficit with Mod A multimodal cues.  SLP Short Term Goal 4 (Week 2): Pt will orient to time with utilization of external memory aids with Mod A multimodal cues.  SLP Short Term Goal 5 (Week 2): Pt will utilize an increased vocal intensity at the word level with Mod A multimodal cues.  SLP Short Term Goal 6 (Week 2): Pt will consume trials of ice chips with minimal overt s/s of aspiration with Mod  A mutlimodal cues with 50% of trials.   Skilled Therapeutic Interventions: Skilled treatment session focused on dysphagia, speech and cognitive goals. Upon arrival, pt was supine in bed and required total +2 assist for transfer from bed to a tilt-in-space wheelchair. SLP facilitated session by providing supervision verbal and visual cues for finger occlusion to complete oral care via suction toothbrush. Pt consumed trials of ice chips and demonstrated increased lingual/oral manipulation and swallow initiation (30 seconds), however, pt demonstrated an immediate or delayed cough with 60% of trials. Pt continues to demonstrate dysphonia and requires Max A multimodal cues for increased vocal intensity at the word level. Pt's speech intelligibility is also impacted by mucous and a constant wet vocal quality with inability to clear with a cough/throat clear. Pt recalled events from meeting with the neuropsychologist with supervision question cues and was  oriented X 4 with Mod I. Suspect patient will be ready for an objective swallow test sometime early next week and would benefit from a FEES to assess vocal cord function due to dysphonia and a weak/breathy cough. Continue with current plan of care.    FIM:  Comprehension Comprehension Mode: Auditory Comprehension: 4-Understands basic 75 - 89% of the time/requires cueing 10 - 24% of the time Expression Expression: 3-Expresses basic 50 - 74% of the time/requires cueing 25 - 50% of the time. Needs to repeat parts of sentences. Social Interaction Social Interaction: 2-Interacts appropriately 25 - 49% of time - Needs frequent redirection. Problem Solving Problem Solving: 2-Solves basic 25 - 49% of the time - needs direction more than half the time to initiate, plan or complete simple activities Memory Memory: 4-Recognizes or recalls 75 - 89% of the time/requires cueing 10 - 24% of the time FIM - Eating Eating Activity: 4: Helper occasionally scoops food on utensil  Pain Pain Assessment Pain Assessment: No/denies pain  Therapy/Group: Individual Therapy  [name] 09/12/2013, 9:46 AM

## 2013-09-12 NOTE — Progress Notes (Signed)
Physical Therapy Session Note  Patient Details  Name: Austin [name] MRN: [id number] Date of Birth: 1932/03/01  Today's Date: 09/12/2013 PT Individual Time: 0900-1000 and 1500-1600 PT Individual Time Calculation (min): 60 min and 60 min  Short Term Goals: Week 2:  PT Short Term Goal 1 (Week 2): Patient will perform bed mobility with maxA x1. PT Short Term Goal 2 (Week 2): Patient will perform functional transfers with maxA x1. PT Short Term Goal 3 (Week 2): Patient will demonstrate sustained attention x10" in controlled environment with max cues.  Skilled Therapeutic Interventions/Progress Updates:    AM Session: Patient received sitting in wheelchair. Session focused on sit<>stand transfers, postural control in standing, and gait training; all performed in front of mirror for visual feedback, however, unsure if this benefits patient. Several sit<>stands with emphasis on remediation of pusher tendencies, increasing weight bearing through L LE by proper positioning of R LE (decreased BOS), upright posture, midline orientation, and neutral alignment of pelvis. Once in standing, progression to gait training 1x10', 1x7' with R handrail and +2 assist for w/c follow, manual facilitation for upright posture, midline orientation, advancement/placement/stabilziation of L LE, weight bearing through L LE, anterior translation of pelvic; max cues for sequencing. Patient limited by internal/external distractions. Patient significantly fatigued by end of session. Patient left sitting in tilt-n-space wheelchair with seatbelt donned and all needs within reach.  PM Session: Patient received sitting in wheelchair. Session focused on sit<>stand transfers, postural control in standing, and stair negotiation. Squat pivot transfer wheelchair<>mat with totalA, sitting EOM with mirror for visual feedback, emphasis on midline orientation and upright posture with decreasing pusher tendencies. Several sit<>stands with  emphasis on remediation of pusher tendencies, increasing weight bearing through L LE by proper positioning of R LE (decreased BOS), upright posture, midline orientation, and neutral alignment of pelvis. Once in standing, stair negotiation 1x1 and 1x2steps with R handrail and +2 assist (ascended forwards, descended backwards), manual facilitation for upright posture, midline orientation, advancement/placement/stabilziation of L LE, weight bearing through L LE, anterior translation of pelvic; max cues for sequencing. Patient returned to room and left supine in bed with all needs within reach and bed alarm on.  Therapy Documentation Precautions:  Precautions Precautions: Fall Precaution Comments: peg Required Braces or Orthoses: Cervical Brace Cervical Brace: Hard collar Other Brace/Splint: R wrist splint and L cock up splint Restrictions Weight Bearing Restrictions: Yes RUE Weight Bearing: Weight bearing as tolerated Other Position/Activity Restrictions: WBAT on Rt UE per ORTHO note Pain: Pain Assessment Pain Assessment: No/denies pain Pain Score: 0-No pain Locomotion : Ambulation Ambulation/Gait Assistance: 1: +2 Total assist   See FIM for current functional status  Therapy/Group: Individual Therapy  Lillia Abed. Charnele Semple, PT, DPT 09/12/2013, 10:30 AM

## 2013-09-12 NOTE — Progress Notes (Signed)
Social Work Patient ID: Austin [name], male   DOB: December 01, 1932, 80 y.o.   MRN: [id number]  Late entry:  Met with pt and wife on Wed to review team conference infor.  Both aware that targeted LOS set for 4 weeks and hope to set targeted date in next weeks conference.  Pt happy to be out of his cervical collar and both feel he is continuing to make good progress.  Had also placed pt on schedule for Dr. Beverly Gust to see yesterday which was done.  See her progress note of this visit.  Will continue to follow.  Austin Welliver, LCSW
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
## 2013-09-12 NOTE — Progress Notes (Signed)
Occupational Therapy Session Note  Patient Details  Name: DONTAY BURDELL MRN: [id number] Date of Birth: October 12, 1932  Today's Date: 09/12/2013 OT Individual Time: [id number] and [id number] OT Individual Time Calculation (min): 60 min and 30 min    Short Term Goals: Week 2:  OT Short Term Goal 1 (Week 2): Patient initiate anterior weight shift in preparation for functional transfer with max cues.  OT Short Term Goal 2 (Week 2): Patient will initate bathing 3/10 body parts with mod cues OT Short Term Goal 3 (Week 2): Patient will complete shower/toilet transfer with max assist x1 OT Short Term Goal 4 (Week 2): Patient will sustain attention to therapeutic activity for 10 seconds in controlled environment  Skilled Therapeutic Interventions/Progress Updates:    Session 1: Pt seen for ADL retraining with focus on sustained attention, initiation, postural control in sitting and standing, and sit<>stand. Pt received sitting in w/c. Completed bathing at sink with pt initiating washing face and chest. Required mod multimodal cues to continue washing remainder of body parts. Completed sit<>stand 5-6x with +2 assist and mirror for visual feedback. Pt initiating anterior weight shift for transfer ~20% of time. Provided cues and manual facilitation for postural control in standing. Stood without UE support to decrease pushing tendencies. Focused on anterior and lateral weight shift to right during LB dressing with pt assisting to pull pants from ankles to knees and assist with donning right sock. Pt required tactile cue to manage pants over L knee. Pt requesting to remain in w/c. Pt left in tilt in space w/c with QRB donned and all needs in reach.   Session 2: Pt seen for 1:1 OT session with focus on initiation, bed mobility, postural control, and functional transfers. Pt received supine in bed. Completed supine>sit with bed flat with max assist. Pt required max cues for initiation with min carryover. Engaged  in weight bearing on R elbow to break pushing tendencies prior to transfer and midline orientation. Completed squat pivot transfer with max assist +2 (second person for safety) bed>w/c. Engaged in reaching task to facilitate anterior weight shift for functional transfers. Pt left sitting in w/c with QRB donned and all needs in reach.   Therapy Documentation Precautions:  Precautions Precautions: Fall Precaution Comments: peg Required Braces or Orthoses: Cervical Brace Cervical Brace: Hard collar Other Brace/Splint: R wrist splint and L cock up splint Restrictions Weight Bearing Restrictions: Yes RUE Weight Bearing: Weight bearing as tolerated Other Position/Activity Restrictions: WBAT on Rt UE per ORTHO note General:   Vital Signs:   Pain: Pain Assessment Pain Assessment: No/denies pain Pain Score: 0-No pain  See FIM for current functional status  Therapy/Group: Individual Therapy  Duayne Cal 09/12/2013, 11:40 AM

## 2013-09-13 ENCOUNTER — Inpatient Hospital Stay (HOSPITAL_COMMUNITY): Payer: Medicare Other

## 2013-09-13 ENCOUNTER — Inpatient Hospital Stay (HOSPITAL_COMMUNITY): Payer: Self-pay

## 2013-09-13 DIAGNOSIS — S069X9A Unspecified intracranial injury with loss of consciousness of unspecified duration, initial encounter: Secondary | ICD-10-CM

## 2013-09-13 DIAGNOSIS — S069XAA Unspecified intracranial injury with loss of consciousness status unknown, initial encounter: Secondary | ICD-10-CM

## 2013-09-13 DIAGNOSIS — Z5189 Encounter for other specified aftercare: Secondary | ICD-10-CM

## 2013-09-13 LAB — GLUCOSE, CAPILLARY
Glucose-Capillary: 101 mg/dL — ABNORMAL HIGH (ref 70–99)
Glucose-Capillary: 152 mg/dL — ABNORMAL HIGH (ref 70–99)
Glucose-Capillary: 150 mg/dL — ABNORMAL HIGH (ref 70–99)
Glucose-Capillary: 128 mg/dL — ABNORMAL HIGH (ref 70–99)
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
## 2013-09-13 NOTE — Progress Notes (Signed)
Occupational Therapy Session Note  Patient Details  Name: Austin [name] MRN: [id number] Date of Birth: [birth year]  Today's Date: 09/13/2013 OT Individual Time: 1330-1430 OT Individual Time Calculation (min): 60 min   Short Term Goals: Week 2:  OT Short Term Goal 1 (Week 2): Patient initiate anterior weight shift in preparation for functional transfer with max cues.  OT Short Term Goal 2 (Week 2): Patient will initate bathing 3/10 body parts with mod cues OT Short Term Goal 3 (Week 2): Patient will complete shower/toilet transfer with max assist x1 OT Short Term Goal 4 (Week 2): Patient will sustain attention to therapeutic activity for 10 seconds in controlled environment  Skilled Therapeutic Interventions/Progress Updates:    Pt seen for 1:1 OT session with focus on sitting balance, postural control, functional transfers, and initiation. Pt received supine in bed. Pt with minimal active movement with hip flexion and extension while supine. Completed supine>sit with max assist. Pt required total-mod assist sitting balance d/t pusher tendencies. Positioned on R elbow to break pushing and facilitate midline orientation. Transferred bed>w/c with +2 assist. Completed squat pivot transfer w/c>toilet (to right) with +2 total assist as pt pushing against grab bars throughout transfer. Pt sat on toilet ~5 min with pillow behind him at supervision level. Pt sitting at midline with posterior lean on toilet. Pt incontinent of bowel prior to transfer, however did have some BM while on toilet. Transferred back to w/c (to left) with max assist (2nd person present for safety). Completed hygiene and LB dressing at sit<>stand level at sink. Pt required +2 assist for sit<>stand 5x. Max cues for pt to keep RUE from sink once in standing to limit pushing. At end of session, pt left sitting in w/c with QRB donned. Pt very motivated by going to bathroom on toilet.   Therapy Documentation Precautions:   Precautions Precautions: Fall Precaution Comments: peg Required Braces or Orthoses: Cervical Brace Cervical Brace: Hard collar Other Brace/Splint: R wrist splint and L cock up splint Restrictions Weight Bearing Restrictions: Yes RUE Weight Bearing: Weight bearing as tolerated Other Position/Activity Restrictions: WBAT on Rt UE per ORTHO note General:   Vital Signs: Therapy Vitals Temp: 97.5 F (36.4 C) Temp src: Oral Pulse Rate: 78 Resp: 18 BP: 128/82 mmHg Patient Position (if appropriate): Sitting Oxygen Therapy SpO2: 97 % O2 Device: None (Room air) Pain: No report of pain  Other Treatments:    See FIM for current functional status  Therapy/Group: Individual Therapy  Duayne Cal 09/13/2013, 3:23 PM

## 2013-09-14 ENCOUNTER — Inpatient Hospital Stay (HOSPITAL_COMMUNITY): Payer: Medicare Other | Admitting: *Deleted

## 2013-09-14 LAB — GLUCOSE, CAPILLARY
Glucose-Capillary: 112 mg/dL — ABNORMAL HIGH (ref 70–99)
Glucose-Capillary: 141 mg/dL — ABNORMAL HIGH (ref 70–99)
Glucose-Capillary: 94 mg/dL (ref 70–99)

## 2013-09-14 NOTE — Progress Notes (Signed)
Physical Therapy Session Note  Patient Details  Name: Austin [name] MRN: [id number] Date of Birth: 1932-08-07  Today's Date: 09/14/2013 PT Individual Time: 1101-1201 PT Individual Time Calculation (min): 60 min   Short Term Goals: Week 2:  PT Short Term Goal 1 (Week 2): Patient will perform bed mobility with maxA x1. PT Short Term Goal 2 (Week 2): Patient will perform functional transfers with maxA x1. PT Short Term Goal 3 (Week 2): Patient will demonstrate sustained attention x10" in controlled environment with max cues.  Skilled Therapeutic Interventions/Progress Updates:    Patient received sitting in wheelchair. Session focused on functional transfers, standing tolerance, remediation of pusher tendencies, and cognitive remediation. Standing frame in front of mirror for visual feedback approx 30' during games of [place] with focus on reaching to the R with R UE for pieces to play game and facilitate weight shift to the R and midline orientation, manual facilitation to assist with weight shift, but patient progresses to decreased facilitation. [employer] with emphasis on initiation, sustained attention to task, and proper sequencing as patient repeatedly skipping therapist's turn. Patient instructed to verbalize who's turn it is each round and this improves patient's sequencing with game. Patient with difficulty initiating both verbally and functionally when it is his turn secondary to poor sustained attention and both internal/external distractions.  Patient returned to room secondary to incontinence of bowel. TotalA for wheelchair>bed via squat pivot transfer, +2 for sit>supine and rolling to B sides to perform hygiene and brief change after incontinence. Patient with requests to stay sitting up in wheelchair. +2 for supine>sit with HOB elevated and use of bedrails, totalA for bed>wheelchair transfer via squat pivot. Squat pivot transfers performed transferring to patient's L in order  to use R UE pusher tendencies to his advantage. Patient will likely benefit from transferring to B sides to assist with breaking up of pusher tendencies, however, second helper not available at time of transfers. Patient left sitting in tilt-n-space with seatbelt donned and all needs within reach; RN educated about tilting schedule.  Therapy Documentation Precautions:  Precautions Precautions: Fall Precaution Comments: peg Other Brace/Splint: R wrist splint and L cock up splint Restrictions Weight Bearing Restrictions: Yes RUE Weight Bearing: Weight bearing as tolerated Other Position/Activity Restrictions: WBAT on Rt UE per ORTHO note Pain: Pain Assessment Pain Assessment: No/denies pain Pain Score: 0-No pain Locomotion : Ambulation Ambulation/Gait Assistance: Not tested (comment)   See FIM for current functional status  Therapy/Group: Individual Therapy  Lillia Abed. Sherry Blackard, PT, DPT 09/14/2013, 12:08 PM

## 2013-09-14 NOTE — Progress Notes (Signed)
Patient reports no significant right wrist pain, doesn't think he benefits from removeable splint.  Right hand/digits not swollen, can make composite fist and release to request Wounds at wrist clean, no evidence for infection, healing well xrays yday reveal that 5 CMC is reduced, with no change in alignment of the fracture fragments  R wrist splint now OPTIONAL.  No hand surgery f/u needed.  If questions related to right wrist, please call:  [facility] [phone]
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
## 2013-09-14 NOTE — Progress Notes (Signed)
1010 Brace to Right Hand discontinued per MD as Xray results from 8/22 satisfactory.

## 2013-09-15 ENCOUNTER — Inpatient Hospital Stay (HOSPITAL_COMMUNITY): Payer: Medicare Other | Admitting: *Deleted

## 2013-09-15 ENCOUNTER — Encounter (HOSPITAL_COMMUNITY): Payer: Self-pay

## 2013-09-15 ENCOUNTER — Inpatient Hospital Stay (HOSPITAL_COMMUNITY): Payer: Self-pay | Admitting: Speech Pathology

## 2013-09-15 DIAGNOSIS — S069XAA Unspecified intracranial injury with loss of consciousness status unknown, initial encounter: Secondary | ICD-10-CM

## 2013-09-15 DIAGNOSIS — Z5189 Encounter for other specified aftercare: Secondary | ICD-10-CM

## 2013-09-15 DIAGNOSIS — S069X9A Unspecified intracranial injury with loss of consciousness of unspecified duration, initial encounter: Secondary | ICD-10-CM

## 2013-09-15 LAB — GLUCOSE, CAPILLARY
Glucose-Capillary: 172 mg/dL — ABNORMAL HIGH (ref 70–99)
Glucose-Capillary: 102 mg/dL — ABNORMAL HIGH (ref 70–99)
Glucose-Capillary: 174 mg/dL — ABNORMAL HIGH (ref 70–99)
Glucose-Capillary: 183 mg/dL — ABNORMAL HIGH (ref 70–99)
Glucose-Capillary: 175 mg/dL — ABNORMAL HIGH (ref 70–99)

## 2013-09-15 MED ORDER — SIMETHICONE 40 MG/0.6ML PO SUSP
80.0000 mg | Freq: Three times a day (TID) | ORAL | Status: DC
  Administered 2013-09-15 – 2013-09-30 (×59): 80 mg
  Filled 2013-09-15 (×69): qty 1.2

## 2013-09-15 NOTE — Progress Notes (Signed)
Physical Therapy Session Note  Patient Details  Name: Austin [name] MRN: [id number] Date of Birth: 12-14-1932  Today's Date: 09/15/2013 PT Individual Time: 0830-0930 PT Individual Time Calculation (min): 60 min   Short Term Goals: Week 1:  PT Short Term Goal 1 (Week 1): Patient will perform bed mobility with maxA x1. PT Short Term Goal 1 - Progress (Week 1): Progressing toward goal PT Short Term Goal 2 (Week 1): Patient will perform functional transfers with maxA x1. PT Short Term Goal 2 - Progress (Week 1): Progressing toward goal PT Short Term Goal 3 (Week 1): Patient will demonstrate sustained attention x10" in controlled environment with max cues. PT Short Term Goal 3 - Progress (Week 1): Progressing toward goal Week 2:  PT Short Term Goal 1 (Week 2): Patient will perform bed mobility with maxA x1. PT Short Term Goal 2 (Week 2): Patient will perform functional transfers with maxA x1. PT Short Term Goal 3 (Week 2): Patient will demonstrate sustained attention x10" in controlled environment with max cues.  Skilled Therapeutic Interventions/Progress Updates:  Pt. Received semi reclined in bed. RN present to administer morning feeding through PEG. Additional therapist present for mentoring. Pt. Is agreeable to physical therapy with no c/o pain. Left shoulder internally rotated and repositioned for comfort prior to performing bed>w/c transfer. Pt. Tasked with cognitive tasks during set-up.  1:1  Session focused on functional transfers, seated balance/postural control for remediation of pusher syndrome and increased proprioception, and cognitive remediation tasks. Pt. Required +2A for w/c<> mat table transfer with squat-pivot transfers. Pt. Seated in dynamic balance reaching forward and to the right playing games of Connect Four with focus for proper sequencing of playing game and facilitate weight shift to the R and midline orientation (initial use of mirror, removed due to distraction),  manual facilitation to assist with weight shift, and frequent verbal cues for cognitive strategy and initiation ability, remaining on task, and maintaining upright posture. Patient able to maintain who's turn and color of playing pieces. Pt. Verbalized sequencing of turns with extra time to process or infrequent verbal cue to assist with initiation. Pt. Has difficulty reaching R and maintaining posture and with environmental distractions (use of visual barriers aided in increase of attention). Additional therapist positioned to posterior of patient and providing tactile and verbal cues for posture control and alignment.  Pain: no c/o during session.  Pt. Condition post therapy session: positioned in tilt in space w/c with quick release donned; all needs within reach.    Therapy Documentation Precautions:  Precautions Precautions: Fall Precaution Comments: peg Required Braces or Orthoses: Cervical Brace (cervical brace d/c'd) Cervical Brace: Hard collar (cervical brace d/c'd) Other Brace/Splint: R wrist splint and L cock up splint Restrictions Weight Bearing Restrictions: Yes RUE Weight Bearing: Weight bearing as tolerated Other Position/Activity Restrictions: WBAT on Rt UE per ORTHO note Pain: Pain Assessment Pain Assessment: No/denies pain  See FIM for current functional status  Therapy/Group: Individual Therapy  Juluis Mire 09/15/2013, 12:10 PM
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
## 2013-09-15 NOTE — Progress Notes (Signed)
Speech Language Pathology Daily Session Note  Patient Details  Name: Austin [name] MRN: [id number] Date of Birth: 04/14/1932  Today's Date: 09/15/2013 SLP Individual Time: 1030-1130 SLP Individual Time Calculation (min): 60 min  Short Term Goals: Week 2: SLP Short Term Goal 1 (Week 2): Pt will demonstrate sustained attention to functional tasks for 2 minutes with Max multimodal cues.  SLP Short Term Goal 2 (Week 2): Pt will initiate functional tasks with Mod A multimodal cues.  SLP Short Term Goal 3 (Week 2): Pt will identify 2 cognitive and 2 physical deficit with Mod A multimodal cues.  SLP Short Term Goal 4 (Week 2): Pt will orient to time with utilization of external memory aids with Mod A multimodal cues.  SLP Short Term Goal 5 (Week 2): Pt will utilize an increased vocal intensity at the word level with Mod A multimodal cues.  SLP Short Term Goal 6 (Week 2): Pt will consume trials of ice chips with minimal overt s/s of aspiration with Mod  A mutlimodal cues with 50% of trials.   Skilled Therapeutic Interventions: Skilled treatment session focused on dysphagia and cognitive goals.  SLP facilitated session by providing set-up assist for oral care via suctioning.  Patient consumed trials of ice chips with Mod cues for increased lingual/oral manipulation and timely swallow initiation with immediate cough response in 90% of trials.  Patient initiated discussion regarding current deficits and rehab goals with min cues for anticipatory awareness.  Patient demonstrated slightly increased vocal intensity at the phrase level today with intermittent wet vocal quality and Max cues to attend to and manage it. Continue with current plan of care.   FIM:  Comprehension Comprehension Mode: Auditory Comprehension: 4-Understands basic 75 - 89% of the time/requires cueing 10 - 24% of the time Expression Expression Mode: Verbal Expression: 3-Expresses basic 50 - 74% of the time/requires cueing 25 -  50% of the time. Needs to repeat parts of sentences. Social Interaction Social Interaction: 4-Interacts appropriately 75 - 89% of the time - Needs redirection for appropriate language or to initiate interaction. Problem Solving Problem Solving: 2-Solves basic 25 - 49% of the time - needs direction more than half the time to initiate, plan or complete simple activities Memory Memory: 4-Recognizes or recalls 75 - 89% of the time/requires cueing 10 - 24% of the time FIM - Eating Eating Activity: 4: Helper occasionally scoops food on utensil  Pain Pain Assessment Pain Assessment: No/denies pain  Therapy/Group: Individual Therapy  [name]., [place] [id number]  [place] 09/15/2013, 12:20 PM

## 2013-09-15 NOTE — Plan of Care (Signed)
Problem: RH PAIN MANAGEMENT Goal: RH STG PAIN MANAGED AT OR BELOW PT'S PAIN GOAL Less than 3  Outcome: Progressing No c/o pain

## 2013-09-15 NOTE — Progress Notes (Signed)
Physical Therapy Session Note  Patient Details  Name: Austin [name] MRN: [id number] Date of Birth: 06-16-1932  Today's Date: 09/15/2013 PT Individual Time: 1400-1500 and [id number] PT Individual Time Calculation (min): 60 min and 47 min  Short Term Goals: Week 2:  PT Short Term Goal 1 (Week 2): Patient will perform bed mobility with maxA x1. PT Short Term Goal 2 (Week 2): Patient will perform functional transfers with maxA x1. PT Short Term Goal 3 (Week 2): Patient will demonstrate sustained attention x10" in controlled environment with max cues.  Skilled Therapeutic Interventions/Progress Updates:    First session: Patient received sitting in wheelchair. Session focused on initiation, postural control and B LE NMR in various positions. Tall kneeling, see details below. +2 required for transition from standing in front of mat to tall kneeling with B UE on kaye bench, then +2 for transition to quadruped with manual facilitation at L UE to stabilize L wrist/elbow/shoulder.   Supine cervical/trap passive stretching and STM, suboccipital release to facilitate improved AROM of neck. Sitting EOM with mirror for visual feedback, emphasis on postural control and midline orientation, patient requiring close SBA-totalA for sitting balance. Patient transferred mat>wheelchair via squat pivot and maxA. Patient left sitting in tilt-n-space with seatbelt donned and all needs within reach.  Second session: Patient received sitting in wheelchair. Session focused on initiation, postural control and B LE NMR in various positions. Tall kneeling, see details below. Emphasis this session on various levels of B UE support to promote increased weight bearing through L UE on kaye bench or with physioball. +2 required for transition from standing in front of mat to tall kneeling with B UE on kaye bench. Sitting EOM with verbal cues only, emphasis on postural control and midline orientation, patient requiring totalA  initially, able to progress to SBA for sitting balance. Visual/tactile cues of patient touching his R shoulder to therapist's shoulder. Patient demonstrating excellent midline orientation at end of session. Patient transferred mat>wheelchair via squat pivot and maxA. Patient left sitting in tilt-n-space with seatbelt donned and all needs within reach.  Therapy Documentation Precautions:  Precautions Precautions: Fall Precaution Comments: peg Required Braces or Orthoses: Cervical Brace (cervical brace d/c'd) Cervical Brace: Hard collar (cervical brace d/c'd) Other Brace/Splint: R wrist splint and L cock up splint Restrictions Weight Bearing Restrictions: Yes RUE Weight Bearing: Weight bearing as tolerated Other Position/Activity Restrictions: WBAT on Rt UE per ORTHO note Pain: Pain Assessment Pain Assessment: No/denies pain Pain Score: 0-No pain (no pain at rest, c/o pain in L shoulder during weightbearing) Locomotion : Ambulation Ambulation/Gait Assistance: Not tested (comment)  Other Treatments: Treatments Neuromuscular Facilitation: Right;Left;Lower Extremity;Forced use;Activity to increase sustained activation;Activity to increase lateral weight shifting Weight Bearing Technique Weight Bearing Technique: Yes RUE Weight Bearing Technique: Quadruped;High kneeling LUE Weight Bearing Technique: Quadruped;High kneeling Response to Weight Bearing Technique: Tall kneeling with emphasis on R UE reaching/unweighting to decrease pusher tendencies and lateral weight shifts to the R  See FIM for current functional status  Therapy/Group: Individual Therapy  Lillia Abed. Kataleena Holsapple, PT, DPT 09/15/2013, 3:41 PM

## 2013-09-15 NOTE — Progress Notes (Signed)
Occupational Therapy Session Note  Patient Details  Name: SHLOK ENO MRN: [id number] Date of Birth: 1932-03-11  Today's Date: 09/15/2013 OT Individual Time: 0930-1015 OT Individual Time Calculation (min): 45 min   Short Term Goals: Week 2:  OT Short Term Goal 1 (Week 2): Patient initiate anterior weight shift in preparation for functional transfer with max cues.  OT Short Term Goal 2 (Week 2): Patient will initate bathing 3/10 body parts with mod cues OT Short Term Goal 3 (Week 2): Patient will complete shower/toilet transfer with max assist x1 OT Short Term Goal 4 (Week 2): Patient will sustain attention to therapeutic activity for 10 seconds in controlled environment  Skilled Therapeutic Interventions/Progress Updates:    Pt sitting in w/c upon arrival and agreeable to bathing and dressing.  Pt required max verbal cues throughout session to correct sitting posture/balance to midline. Pt corrected sitting balance when cued.  Pt required min verbal cues to initiate bathing tasks. Pt completed all UB bathing tasks with min verbal cues and when presented bathing supplies.  Pt sustained attention during UB bathing for approx 60 seconds.  Pt initiated threading RLE into pants but required assistance to complete task. Pt able to pull up pants from ankle to knee.  Pt required tot A + 2 for standing to pull up pants.  Focus on attention, task initiation, activity tolerance, sitting balance, sit<>stand, standing balance, and safety awareness.  Therapy Documentation Precautions:  Precautions Precautions: Fall Precaution Comments: peg Required Braces or Orthoses: Cervical Brace (cervical brace d/c'd) Cervical Brace: Hard collar (cervical brace d/c'd) Other Brace/Splint: R wrist splint and L cock up splint Restrictions Weight Bearing Restrictions: Yes RUE Weight Bearing: Weight bearing as tolerated Other Position/Activity Restrictions: WBAT on Rt UE per ORTHO note   Pain: Pain  Assessment Pain Assessment: No/denies pain  See FIM for current functional status  Therapy/Group: Individual Therapy  Leroy Libman 09/15/2013, 11:11 AM
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
## 2013-09-16 ENCOUNTER — Inpatient Hospital Stay (HOSPITAL_COMMUNITY): Payer: Medicare Other | Admitting: Speech Pathology

## 2013-09-16 ENCOUNTER — Inpatient Hospital Stay (HOSPITAL_COMMUNITY): Payer: Medicare Other | Admitting: *Deleted

## 2013-09-16 ENCOUNTER — Inpatient Hospital Stay (HOSPITAL_COMMUNITY): Payer: Self-pay | Admitting: *Deleted

## 2013-09-16 ENCOUNTER — Encounter (HOSPITAL_COMMUNITY): Payer: Self-pay

## 2013-09-16 DIAGNOSIS — Z5189 Encounter for other specified aftercare: Secondary | ICD-10-CM

## 2013-09-16 DIAGNOSIS — S069XAA Unspecified intracranial injury with loss of consciousness status unknown, initial encounter: Secondary | ICD-10-CM

## 2013-09-16 DIAGNOSIS — S069X9A Unspecified intracranial injury with loss of consciousness of unspecified duration, initial encounter: Secondary | ICD-10-CM

## 2013-09-16 LAB — GLUCOSE, CAPILLARY
Glucose-Capillary: 115 mg/dL — ABNORMAL HIGH (ref 70–99)
Glucose-Capillary: 121 mg/dL — ABNORMAL HIGH (ref 70–99)
Glucose-Capillary: 108 mg/dL — ABNORMAL HIGH (ref 70–99)
Glucose-Capillary: 100 mg/dL — ABNORMAL HIGH (ref 70–99)

## 2013-09-16 MED ORDER — TIZANIDINE HCL 2 MG PO TABS
2.0000 mg | ORAL_TABLET | Freq: Four times a day (QID) | ORAL | Status: DC
Start: 2013-09-16 — End: 2013-09-19
  Administered 2013-09-16 – 2013-09-19 (×10): 2 mg via ORAL
  Filled 2013-09-16 (×14): qty 1

## 2013-09-16 NOTE — Progress Notes (Signed)
Physical Therapy Session Note  Patient Details  Name: Austin [name] MRN: [id number] Date of Birth: [birth year]  Today's Date: 09/16/2013 PT Individual Time: 1300-1400 and 1528-1600 PT Individual Time Calculation (min): 60 min and 28 min  Short Term Goals: Week 2:  PT Short Term Goal 1 (Week 2): Patient will perform bed mobility with maxA x1. PT Short Term Goal 2 (Week 2): Patient will perform functional transfers with maxA x1. PT Short Term Goal 3 (Week 2): Patient will demonstrate sustained attention x10" in controlled environment with max cues.  Skilled Therapeutic Interventions/Progress Updates:    First session: Patient received sitting in wheelchair with wife present. Education about pressure relief and demonstration provided for tilting function in wheelchair. Educated wife to perform every 20-30 minutes when she is present. Session focused on functional transfers and postural control with standing tolerance with emphasis on initiation, problem solving and short term memory playing master mind game. In standing frame, emphasis on midline orientation, upright posture, and lateral weight shifts to the R secondary to pushing L. Game requires patient to remember colors and sequences to place peg correctly from turn to turn. At end of session, patient transferred wheelchair>bed with maxA x1 and sit>supine with +2 in order to be cleaned up from incontinent episode; nurse tech aware.  Second session: Patient received supine in bed, wife and daughter present. Session focused family education with emphasis on L UE/LE stretching/tone management. Visual demonstration and patient's daughter return demonstrates techniques appropriately. Discussion about discharge planning throughout session and patient's CLOF, progress so far on rehab, and goals for therapy. Patient's wife reports that she will be the primary caregiver, but that the family knows "2 people retired from the medical field who are willing  to help." Discussion about pusher tendencies and impact on functional level. At this time, appears that discharge plan is still set for discharge home with 24/7 assist and supervision. Patient very lethargic towards end of session, falling asleep during stretching. Educated on proper placement of pillows to support L UE. Patient left semi-reclined in bed with bed alarm on and all needs within reach.  Therapy Documentation Precautions:  Precautions Precautions: Fall Precaution Comments: peg Required Braces or Orthoses: Cervical Brace (cervical brace d/c'd) Cervical Brace: Hard collar (cervical brace d/c'd) Other Brace/Splint: R wrist splint and L cock up splint Restrictions Weight Bearing Restrictions: Yes RUE Weight Bearing: Weight bearing as tolerated Other Position/Activity Restrictions: WBAT on Rt UE per ORTHO note Pain: Pain Assessment Pain Assessment: No/denies pain Pain Score: 0-No pain Locomotion : Ambulation Ambulation/Gait Assistance: Not tested (comment)   See FIM for current functional status  Therapy/Group: Individual Therapy and Co-Treatment with Rec Therapy during first session  Byers. Kanija Remmel, PT, DPT 09/16/2013, 2:03 PM

## 2013-09-16 NOTE — Progress Notes (Signed)
Occupational Therapy Session Note  Patient Details  Name: Austin [name] MRN: [id number] Date of Birth: [birth year]  Today's Date: 09/16/2013 OT Individual Time: 0930-1030 OT Individual Time Calculation (min): 60 min   Short Term Goals: Week 2:  OT Short Term Goal 1 (Week 2): Patient initiate anterior weight shift in preparation for functional transfer with max cues.  OT Short Term Goal 2 (Week 2): Patient will initate bathing 3/10 body parts with mod cues OT Short Term Goal 3 (Week 2): Patient will complete shower/toilet transfer with max assist x1 OT Short Term Goal 4 (Week 2): Patient will sustain attention to therapeutic activity for 10 seconds in controlled environment  Skilled Therapeutic Interventions/Progress Updates:    Pt seen for ADL retraining with focus on postural control, activity tolerance, sustained attention, functional transfers, and cognitive remediation. Pt received in w/c following PT session and agreeable to shower this AM. Transferred w/c>rolling shower chair with +2 assist d/t pushing tendencies. Pt initiated bathing task, requiring mod cues for sequencing. Pt sustained attention to bathing ~45 seconds. Completed dressing from w/c level with mod cues for sustained attention to task d/t fatigue. Pt required max cues for postural control in midline with use of mirror as visual feedback. Pt responded well to cues such as "reach towards floor" or "reach towards wheel" for lateral and anterior weight shift. Pt required +2 assist for sit<>stand x3 during dressing. Pt left in tilt-in-space w/c with all needs in reach and QRB donned.   Therapy Documentation Precautions:  Precautions Precautions: Fall Precaution Comments: peg Required Braces or Orthoses: Cervical Brace (cervical brace d/c'd) Cervical Brace: Hard collar (cervical brace d/c'd) Other Brace/Splint: R wrist splint and L cock up splint Restrictions Weight Bearing Restrictions: Yes RUE Weight Bearing: Weight  bearing as tolerated Other Position/Activity Restrictions: WBAT on Rt UE Austin ORTHO note General:   Vital Signs:   Pain: No report of pain during therapy session.   See FIM for current functional status  Therapy/Group: Individual Therapy  Duayne Cal 09/16/2013, 12:19 PM

## 2013-09-16 NOTE — Progress Notes (Signed)
Speech Language Pathology Daily Session Note  Patient Details  Name: Austin [name] MRN: [id number] Date of Birth: 02-Dec-1932  Today's Date: 09/16/2013 SLP Individual Time: 1110-1210 SLP Individual Time Calculation (min): 60 min  Short Term Goals: Week 2: SLP Short Term Goal 1 (Week 2): Pt will demonstrate sustained attention to functional tasks for 2 minutes with Max multimodal cues.  SLP Short Term Goal 2 (Week 2): Pt will initiate functional tasks with Mod A multimodal cues.  SLP Short Term Goal 3 (Week 2): Pt will identify 2 cognitive and 2 physical deficit with Mod A multimodal cues.  SLP Short Term Goal 4 (Week 2): Pt will orient to time with utilization of external memory aids with Mod A multimodal cues.  SLP Short Term Goal 5 (Week 2): Pt will utilize an increased vocal intensity at the word level with Mod A multimodal cues.  SLP Short Term Goal 6 (Week 2): Pt will consume trials of ice chips with minimal overt s/s of aspiration with Mod  A mutlimodal cues with 50% of trials.   Skilled Therapeutic Interventions: Skilled treatment session focused on cognitive-linguistic and dysphagia goals. SLP facilitated session by providing Min A verbal cues for functional problem solving during oral care with suction toothbrush. Pt consumed trials of ice chips and demonstrated increased lingual and oral manipulation with an increased swallow initiation (~25 secs), however, pt demonstrated overt coughing with 75% of trials and requested to end trials due to fear of getting "strangled." SLP also facilitated session by providing Min-Mod A verbal, visual and question cues for functional problem solving with basic money management task. Pt continues to demonstrate dysphonia and requires Mod A multimodal cues to utilize an increased vocal intensity. Pt left upright in the tilt-in-space wheelchair with quick release belt in place and all needs within reach. Continue with current plan of care.    FIM:   Comprehension Comprehension Mode: Auditory Comprehension: 5-Follows basic conversation/direction: With no assist Expression Expression Mode: Verbal Expression: 3-Expresses basic 50 - 74% of the time/requires cueing 25 - 50% of the time. Needs to repeat parts of sentences. Social Interaction Social Interaction: 4-Interacts appropriately 75 - 89% of the time - Needs redirection for appropriate language or to initiate interaction. Problem Solving Problem Solving: 3-Solves basic 50 - 74% of the time/requires cueing 25 - 49% of the time Memory Memory: 4-Recognizes or recalls 75 - 89% of the time/requires cueing 10 - 24% of the time FIM - Eating Eating Activity: 4: Help with managing cup/glass;4: Helper occasionally scoops food on utensil;5: Supervision/cues  Pain Pain Assessment Pain Assessment: No/denies pain Pain Score: 0-No pain  Therapy/Group: Individual Therapy  Bellarose Burtt 09/16/2013, 3:27 PM

## 2013-09-16 NOTE — Progress Notes (Signed)
Note/chart reviewed.  Katie Jonluke Cobbins, RD, LDN Pager #: [phone] After-Hours Pager #: [phone]  

## 2013-09-16 NOTE — Progress Notes (Addendum)
Recreational Therapy Session Note  Patient Details  Name: Austin [name] MRN: [id number] Date of Birth: 1932/09/16 Today's Date: 09/16/2013  Pain: no c/o Skilled Therapeutic Interventions/Progress Updates: Session focused on activity tolerance, standing tolerance, standing at midline, selective attention, visual scanning, memory, problem solving, & verbally directing therapist during co-treat with PT.  Pt stood in standing frame x2 with min-mod cues for posture & mod cues initialling for cognition increasing to max cues at end of session due to fatigue while playing "mastermind" tabletop game.    Therapy/Group: Co-Treatment  Tailer Volkert 09/16/2013, 4:35 PM
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
## 2013-09-16 NOTE — Progress Notes (Signed)
Physical Therapy Session Note  Patient Details  Name: Austin [name] MRN: [id number] Date of Birth: May 15, 1932  Today's Date: 09/16/2013 PT Individual Time: 0830-0930 PT Individual Time Calculation (min): 60 min   Short Term Goals: Week 1:  PT Short Term Goal 1 (Week 1): Patient will perform bed mobility with maxA x1. PT Short Term Goal 1 - Progress (Week 1): Progressing toward goal PT Short Term Goal 2 (Week 1): Patient will perform functional transfers with maxA x1. PT Short Term Goal 2 - Progress (Week 1): Progressing toward goal PT Short Term Goal 3 (Week 1): Patient will demonstrate sustained attention x10" in controlled environment with max cues. PT Short Term Goal 3 - Progress (Week 1): Progressing toward goal Week 2:  PT Short Term Goal 1 (Week 2): Patient will perform bed mobility with maxA x1. PT Short Term Goal 2 (Week 2): Patient will perform functional transfers with maxA x1. PT Short Term Goal 3 (Week 2): Patient will demonstrate sustained attention x10" in controlled environment with max cues.  Skilled Therapeutic Interventions/Progress Updates:  Pt. Received semi-reclined in bed at 30 degrees asleep. Pt. Woken and indicated he was ready for therapy session; stated he knew it started at 8:30 and was looking forward to it.  1:1 Treatment focused on functional transfers, seated postural alignment and balance with cues for control of pusher syndrome remediation and for increased self awareness of proprioceptive information.  Assist level is variable with with activity/task: min A to +2A. Pt. Required +2A supine>sit bed>w/c at start of session. Pt displayed carry-over from previous day and knew to place R elbow onto w/c armrest for proper alignment of posture while seated. Pt. Required mod A for transfers w/c <>mat table x 2 Pt. Pt. Performed conative task incorporating balance control and postural stability during game of "war" with frequent cues for maintaining postural  alignment when static sitting and controlling posterior and left leaning during dynamic seating activities: reaching and manipulating playing cards. Pt. Verbalized sequencing of turns with increase two-step processes/commands for greater cogntive remediation. Pt. Performed tall kneeling with +2A with two therapists for facilitating increased NMReEd and weight shifting onto LLE; hip extension and knee flexion stressed and tactile cues utilized to maintain position to neutralize extensor pattern. Pt. Has difficulty reaching R and maintaining posture and with environmental distractions (use of visual barriers aided in increase of attention). Additional therapist positioned to R of patient and providing tactile and verbal cues for posture control and alignment.  Pain: Hurts a little L shoulder due to sublux with activity. Repositioned during session to relieve discomfort.  Pt. Seated in tilt-in-space w/c and returned to room for next therapy session (OT); pt provided call bell when therapist entered and started session. Hand-off completed with OT and PTA shook patient's hand and expressed pleasantries.  Therapy Documentation Precautions:  Precautions Precautions: Fall Precaution Comments: peg Required Braces or Orthoses: Cervical Brace (cervical brace d/c'd) Cervical Brace: Hard collar (cervical brace d/c'd) Other Brace/Splint: R wrist splint and L cock up splint Restrictions Weight Bearing Restrictions: Yes RUE Weight Bearing: Weight bearing as tolerated Other Position/Activity Restrictions: WBAT on Rt UE per ORTHO note  See FIM for current functional status  Therapy/Group: Individual Therapy  Juluis Mire 09/16/2013, 11:43 AM

## 2013-09-17 ENCOUNTER — Inpatient Hospital Stay (HOSPITAL_COMMUNITY): Payer: Self-pay

## 2013-09-17 ENCOUNTER — Encounter (HOSPITAL_COMMUNITY): Payer: Self-pay

## 2013-09-17 ENCOUNTER — Inpatient Hospital Stay (HOSPITAL_COMMUNITY): Payer: Medicare Other | Admitting: Speech Pathology

## 2013-09-17 DIAGNOSIS — S069XAA Unspecified intracranial injury with loss of consciousness status unknown, initial encounter: Secondary | ICD-10-CM

## 2013-09-17 DIAGNOSIS — S069X9A Unspecified intracranial injury with loss of consciousness of unspecified duration, initial encounter: Secondary | ICD-10-CM

## 2013-09-17 DIAGNOSIS — Z5189 Encounter for other specified aftercare: Secondary | ICD-10-CM

## 2013-09-17 LAB — GLUCOSE, CAPILLARY
Glucose-Capillary: 104 mg/dL — ABNORMAL HIGH (ref 70–99)
Glucose-Capillary: 135 mg/dL — ABNORMAL HIGH (ref 70–99)
Glucose-Capillary: 92 mg/dL (ref 70–99)
Glucose-Capillary: 114 mg/dL — ABNORMAL HIGH (ref 70–99)

## 2013-09-17 MED ORDER — BETHANECHOL CHLORIDE 25 MG PO TABS
50.0000 mg | ORAL_TABLET | Freq: Three times a day (TID) | ORAL | Status: DC
  Administered 2013-09-17 – 2013-09-25 (×26): 50 mg via ORAL
  Filled 2013-09-17 (×30): qty 2

## 2013-09-17 NOTE — Progress Notes (Signed)
Social Work Patient ID: Austin [name], male   DOB: 05-06-1932, 80 y.o.   MRN: [id number]  Have reviewed team conference with pt and wife.  Both aware of LOS now at approx 3 weeks, however, no target date set.  Long discussion with wife about anticipated care needs of minimal assistance overall, w/c level and concern over extent of pt's pushing with all mobility activities.  Stressed to wife that the caregivers must be able to provide hands - on physical assistance and cover 24/7.  Wife reports she has had offers from "retired Marine scientist friends" about helping at d/c.  I have asked that she confirm their level of physical ability and availability this week as we want to confirm if a d/c home is truly possible.  Wife very aware of SNF option available.  Notes she will speak with family and those who have offered assist and confirm with me by next conference or earlier.  Will keep staff posted.  Austin Febo, LCSW

## 2013-09-17 NOTE — Progress Notes (Signed)
Occupational Therapy Session Note  Patient Details  Name: Austin [name] MRN: [id number] Date of Birth: [birth year]  Today's Date: 09/17/2013 OT Individual Time: 1330-1400 OT Individual Time Calculation (min): 30 min   Short Term Goals: Week 2:  OT Short Term Goal 1 (Week 2): Patient initiate anterior weight shift in preparation for functional transfer with max cues.  OT Short Term Goal 2 (Week 2): Patient will initate bathing 3/10 body parts with mod cues OT Short Term Goal 3 (Week 2): Patient will complete shower/toilet transfer with max assist x1 OT Short Term Goal 4 (Week 2): Patient will sustain attention to therapeutic activity for 10 seconds in controlled environment  Skilled Therapeutic Interventions/Progress Updates:    Pt engaged in dynamic sitting tasks with emphasis on maintaining midline sitting posture.  Pt placed in front of mirror with vertical tape line for orientation.  Pt continues to require mod verbal cues for maintaining midline sitting posture but stated that having the tape line for orientation was beneficial.  Pt transitioned to unsupported sitting and reaching to right upper quadrant with RUE to facilitate weight shifts to right to maintain midline sitting posture.   Therapy Documentation Precautions:  Precautions Precautions: Fall Precaution Comments: peg Required Braces or Orthoses: Cervical Brace (cervical brace d/c'd) Cervical Brace: Hard collar (cervical brace d/c'd) Other Brace/Splint: R wrist splint and L cock up splint Restrictions Weight Bearing Restrictions: Yes RUE Weight Bearing: Weight bearing as tolerated Other Position/Activity Restrictions: WBAT on Rt UE per ORTHO note Pain: Pain Assessment Pain Assessment: No/denies pain  See FIM for current functional status  Therapy/Group: Individual Therapy  Leroy Libman 09/17/2013, 3:07 PM
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
## 2013-09-17 NOTE — Progress Notes (Signed)
Speech Language Pathology Daily Session Note  Patient Details  Name: Austin [name] MRN: [id number] Date of Birth: 26-Nov-1932  Today's Date: 09/17/2013 SLP Individual Time: 0800-0900 SLP Individual Time Calculation (min): 60 min  Short Term Goals: Week 2: SLP Short Term Goal 1 (Week 2): Pt will demonstrate sustained attention to functional tasks for 2 minutes with Max multimodal cues.  SLP Short Term Goal 2 (Week 2): Pt will initiate functional tasks with Mod A multimodal cues.  SLP Short Term Goal 3 (Week 2): Pt will identify 2 cognitive and 2 physical deficit with Mod A multimodal cues.  SLP Short Term Goal 4 (Week 2): Pt will orient to time with utilization of external memory aids with Mod A multimodal cues.  SLP Short Term Goal 5 (Week 2): Pt will utilize an increased vocal intensity at the word level with Mod A multimodal cues.  SLP Short Term Goal 6 (Week 2): Pt will consume trials of ice chips with minimal overt s/s of aspiration with Mod  A mutlimodal cues with 50% of trials.   Skilled Therapeutic Interventions: Skilled treatment session focused on dysphagia and cognitive-linguistic goals. Upon entering the room, patient was supine in bed and was transferred to tilt-in-space wheelchair with Total +2 assist. Patient initially appeared confused due to reporting, "My wife is asleep next to me and no one knows I'm here." SLP positioned patient to eye sight of the chair to confirm wife was not present and patient was independently oriented x4. Student facilitated session by providing Supervision verbal cues for functional problem solving during oral care with suction toothbrush and Total A to end task due to preservation. Patient consumed trials of ice chips and demonstrated increased oral manipulation with an increased swallow initiation (~15 secs); however, patient demonstrated overt coughing with 100% of trials and requested to end trials due to "aggravation" from coughing. Student also  facilitated session by providing Min-Mod A verbal, visual and question cues for functional problem solving and organization with basic card games. Student also utilized external visual aids to increase recall of important information in regards to scheduling and patient required Min A cues for appropriate utilization. Patient left upright in the tilt-in-space wheelchair with quick release [name] in place and all needs within reach. Continue with current plan of care.   FIM:  Comprehension Comprehension Mode: Auditory Comprehension: 4-Understands basic 75 - 89% of the time/requires cueing 10 - 24% of the time Expression Expression Mode: Verbal Expression: 4-Expresses basic 75 - 89% of the time/requires cueing 10 - 24% of the time. Needs helper to occlude trach/needs to repeat words. Social Interaction Social Interaction: 5-Interacts appropriately 90% of the time - Needs monitoring or encouragement for participation or interaction. Problem Solving Problem Solving: 4-Solves basic 75 - 89% of the time/requires cueing 10 - 24% of the time Memory Memory: 3-Recognizes or recalls 50 - 74% of the time/requires cueing 25 - 49% of the time FIM - Eating Eating Activity: 4: Helper occasionally scoops food on utensil;5: Supervision/cues  Pain Pain Assessment Pain Assessment: No/denies pain Pain Score: 0-No pain  Therapy/Group: Individual Therapy  Jeweldean Drohan 09/17/2013, 9:47 AM

## 2013-09-17 NOTE — Progress Notes (Signed)
The skilled treatment note has been reviewed and SLP is in agreement.  Alister Staver, M.A., CCC-SLP  [phone]   

## 2013-09-17 NOTE — Progress Notes (Signed)
Physical Therapy Session Note  Patient Details  Name: Austin [name] MRN: [id number] Date of Birth: 09-Aug-1932  Today's Date: 09/17/2013 PT Individual Time: 1100-1200 PT Individual Time Calculation (min): 60 min   Short Term Goals: Week 2:  PT Short Term Goal 1 (Week 2): Patient will perform bed mobility with maxA x1. PT Short Term Goal 2 (Week 2): Patient will perform functional transfers with maxA x1. PT Short Term Goal 3 (Week 2): Patient will demonstrate sustained attention x10" in controlled environment with max cues.  Skilled Therapeutic Interventions/Progress Updates:  1:1. Pt received sitting in w/c, ready for therapy. Focus this session on functional transfers, tone management, midline orientation, emergent awareness, cognitive remediation. Pt req Ax2 persons for squat pivot t/f w/c<>tx mat w/ increased A req when going to pt's L side due to pushing. Emphasis on t/f sit<>R sidelying and B rolling w/ B LE in hooklying position for tone management, core activation and weightshift to R side, pt req max A overall.   Ax2 persons to assist pt w/ t/f sit>stand>tall kneeling on tx mat w/ bench in front for B UE support for dissociation of tone in L LE. +2 holding pt's R UE to prevent pushing, attempting to guide pt towards R for weight shifting. Therapist facilitating overall trunk/hip extension. Activity became unsafe due to level of extensor tone in L LE, req Ax3 persons to safely return pt to sitting position on tx mat.   Pt able to maintain sitting balance EOM x15' w/ min guard A-min A while engaging in game of tic-tac-toe. Game played on mirror positioned to pts right side just at edge of BOS so as to facilitate weightshift to anterior/R directions. Pt demonstrating overall good emergent awareness of maintaining midline orientation and maintaining shoulder to shoulder with therapist, however, pt sometimes overcompensating and going to far to R side. Pt only req cues 2x for turn taking  and demonstrated excellent problem solving and mental flexibility by alternating each round as to who was "X" and "O."   Therapy Documentation Precautions:  Precautions Precautions: Fall Precaution Comments: peg Required Braces or Orthoses: Cervical Brace (cervical brace d/c'd) Cervical Brace: Hard collar (cervical brace d/c'd) Other Brace/Splint: R wrist splint and L cock up splint Restrictions Weight Bearing Restrictions: Yes RUE Weight Bearing: Weight bearing as tolerated Other Position/Activity Restrictions: WBAT on Rt UE per ORTHO note General:   Vital Signs:   Pain: Pain Assessment Pain Assessment: No/denies pain Pain Score: 0-No pain Mobility:   Locomotion :    Trunk/Postural Assessment :    Balance:   Exercises:   Other Treatments:    See FIM for current functional status  Therapy/Group: Individual Therapy  [name] 09/17/2013, 12:22 PM

## 2013-09-17 NOTE — Plan of Care (Signed)
Problem: RH BLADDER ELIMINATION Goal: RH STG MANAGE BLADDER WITH ASSISTANCE STG Manage Bladder With mod Assistance  Outcome: Not Progressing I/o cath to empty bladder. No urinary incontinence this shift. adm

## 2013-09-17 NOTE — Plan of Care (Signed)
Problem: RH BLADDER ELIMINATION Goal: RH STG MANAGE BLADDER WITH ASSISTANCE STG Manage Bladder With mod Assistance  Outcome: Not Progressing Requires in and out cath for pvr > 350cc
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
## 2013-09-17 NOTE — Progress Notes (Signed)
Physical Therapy Session Note  Patient Details  Name: Austin [name] MRN: [id number] Date of Birth: 17-Jul-1932  Today's Date: 09/17/2013 PT Individual Time: 1430-1530 PT Individual Time Calculation (min): 60 min   Short Term Goals: Week 1:  PT Short Term Goal 1 (Week 1): Patient will perform bed mobility with maxA x1. PT Short Term Goal 1 - Progress (Week 1): Progressing toward goal PT Short Term Goal 2 (Week 1): Patient will perform functional transfers with maxA x1. PT Short Term Goal 2 - Progress (Week 1): Progressing toward goal PT Short Term Goal 3 (Week 1): Patient will demonstrate sustained attention x10" in controlled environment with max cues. PT Short Term Goal 3 - Progress (Week 1): Progressing toward goal  Skilled Therapeutic Interventions/Progress Updates:  1:1. Pt received sitting in tilt-in-space, ready for therapy. Focus this session on w/c propulsion/management, sustained>selective attention, midline orientation and problem solving. Pt req Ax2 persons for squat pivot t/f tilt in space>tx mat>basic w/c. Pt instructed on R hemi-technique for w/c propulsion w/ overall max multimodal cues for seq and technique as well as mod-max A for propulsion and steering 100'x2 in forwards and reverse directions. Use of standing frame x12min for increased B LE weightbearing and emphasis on midline orientation w/ use of line on mirror. Pt engaged in card game of "war" to to target attention, problem solving and use of R UE to prevent pushing, req min cueing overall. Pt req min cues to intermittently assess midline orientation in mirror, only slightly drifting from midline during task. Pt left sitting in tilt in space w/c at end of session w/ all needs in reach, quick release belt in place.   Therapy Documentation Precautions:  Precautions Precautions: Fall Precaution Comments: peg Required Braces or Orthoses: Cervical Brace (cervical brace d/c'd) Cervical Brace: Hard collar (cervical  brace d/c'd) Other Brace/Splint: R wrist splint and L cock up splint Restrictions Weight Bearing Restrictions: Yes RUE Weight Bearing: Weight bearing as tolerated Other Position/Activity Restrictions: WBAT on Rt UE per ORTHO note Pain: Pain Assessment Pain Assessment: No/denies pain  See FIM for current functional status  Therapy/Group: Individual Therapy  [name] 09/17/2013, 4:52 PM

## 2013-09-18 ENCOUNTER — Inpatient Hospital Stay (HOSPITAL_COMMUNITY): Payer: Medicare Other | Admitting: *Deleted

## 2013-09-18 ENCOUNTER — Encounter (HOSPITAL_COMMUNITY): Payer: Self-pay

## 2013-09-18 ENCOUNTER — Inpatient Hospital Stay (HOSPITAL_COMMUNITY): Payer: Medicare Other

## 2013-09-18 ENCOUNTER — Inpatient Hospital Stay (HOSPITAL_COMMUNITY): Payer: Medicare Other | Admitting: Speech Pathology

## 2013-09-18 LAB — GLUCOSE, CAPILLARY
Glucose-Capillary: 103 mg/dL — ABNORMAL HIGH (ref 70–99)
Glucose-Capillary: 154 mg/dL — ABNORMAL HIGH (ref 70–99)
Glucose-Capillary: 88 mg/dL (ref 70–99)
Glucose-Capillary: 106 mg/dL — ABNORMAL HIGH (ref 70–99)

## 2013-09-18 NOTE — Plan of Care (Signed)
Problem: RH BOWEL ELIMINATION Goal: RH STG MANAGE BOWEL WITH ASSISTANCE STG Manage Bowel with mod Assistance.  Outcome: Not Progressing Incontinent wears brief
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
## 2013-09-18 NOTE — Progress Notes (Signed)
The skilled treatment note has been reviewed and SLP is in agreement.  Montrel Donahoe, M.A., CCC-SLP  [phone]   
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
## 2013-09-18 NOTE — Plan of Care (Signed)
Problem: RH BLADDER ELIMINATION Goal: RH STG MANAGE BLADDER WITH ASSISTANCE STG Manage Bladder With mod Assistance  Catheter in place
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
## 2013-09-18 NOTE — Progress Notes (Signed)
Physical Therapy Weekly Progress Note  Patient Details  Name: Austin [name] MRN: [id number] Date of Birth: [birth year]  Beginning of progress report period: September 11, 2013 End of progress report period: September 18, 2013  Today's Date: 09/18/2013 PT Individual Time: 0800-0900 and [id number] PT Individual Time Calculation (min): 60 min and 30 min  Patient has met 2 of 3 short term goals. Patient has met bed mobility goal, but does not perform at these levels consistently. Patient has made good progress during this week on rehab, although progress not clearly demonstrated functionally. Patient has progressed to perform unsupported sitting balance with SBA and max verbal and tactile cues, however can require up to totalA. Patient's greatest barriers at this point are his pusher tendencies/extensor tone and significant perceptual impairments, which impact his postural control and overall functional mobility.. Patient is currently demonstrating behaviors consistent with [place]o Level VI.  Patient continues to demonstrate the following deficits:  and therefore will continue to benefit from skilled PT intervention to enhance overall performance with activity tolerance, balance, postural control, ability to compensate for deficits, functional use of  right upper extremity, left upper extremity and left lower extremity, attention, awareness, coordination and knowledge of precautions.  Patient progressing toward long term goals..  Continue plan of care.  PT Short Term Goals Week 1:  PT Short Term Goal 1 (Week 1): Patient will perform bed mobility with maxA x1. PT Short Term Goal 1 - Progress (Week 1): Progressing toward goal PT Short Term Goal 2 (Week 1): Patient will perform functional transfers with maxA x1. PT Short Term Goal 2 - Progress (Week 1): Progressing toward goal PT Short Term Goal 3 (Week 1): Patient will demonstrate sustained attention x10" in controlled environment with max cues. PT  Short Term Goal 3 - Progress (Week 1): Progressing toward goal Week 2:  PT Short Term Goal 1 (Week 2): Patient will perform bed mobility with maxA x1. PT Short Term Goal 1 - Progress (Week 2): Progressing toward goal (meets intermittently and with use of hospital bed functions) PT Short Term Goal 2 (Week 2): Patient will perform functional transfers with maxA x1. PT Short Term Goal 2 - Progress (Week 2): Met PT Short Term Goal 3 (Week 2): Patient will demonstrate sustained attention x10" in controlled environment with max cues. PT Short Term Goal 3 - Progress (Week 2): Met Week 3:  PT Short Term Goal 1 (Week 3): Patient will perform bed mobility with maxA x1. PT Short Term Goal 2 (Week 3): Patient will perform unsupported static sitting balance with supervision/verbal cues. PT Short Term Goal 3 (Week 3): Patient will perform gait training 22' with +2 assist.  Skilled Therapeutic Interventions/Progress Updates:    AM Session: Patient received semi-reclined in bed, easily awakened. Session focused on postural control with emphasis on anterior and lateral weight shifting, WB techniques for L UE/LE, remediation of pusher tendencies, and trunk rotation. Patient +2 for supine>sit and squat pivot transfer bed>wheelchair. Patient demonstrating improved ability to initiate anterior weight shift, but continues to have difficulty maintaining.  NMR/psotural control: +2 to scoot anteriorly from wheelchair to straddle sitting on kaye bench. One therapist in front and one behind patient. Emphasis in this position on anterior weight shift/anterior pelvic tilt, weight bearing through R elbow to decrease pushing with R UE extended, reaching anteriorly and to the R for rings, then weight bearing through L elbow to reach towards floor for rings with R UE to facilitate trunk and cervical rotation. Facilitation for weight  bearing through B LEs. Rest breaks with B forearms on thighs to continued to facilitate anterior pelvic  tilt/weight shift.  Patient reclined on wedge with inverted T towel roll to facilitate lumbar/thoracic extension stretch, pec stretch, and hip flexor stretch. Patient handed off to SLP sitting in wheelchair.  PM Session: Patient received sitting in standard wheelchair. Session focused on initiation and sustained attention with wheelchair mobility. Patient used R UE/LE intermittently with manual facilitation at R LE to optimize heel strike and weight bearing throughout range of motion; facilitation to break up extensor tone in R LE. Patient able to propel wheelchair >150' with modA and increased time. Patient with sustained attention to task for 5-7" before becoming externally distracted. Patient left sitting in wheelchair with seatbelt donned and all needs within reach.  Therapy Documentation Precautions:  Precautions Precautions: Fall Precaution Comments: peg Required Braces or Orthoses: Cervical Brace (cervical brace d/c'd) Cervical Brace: Hard collar (cervical brace d/c'd) Other Brace/Splint: R wrist splint and L cock up splint Restrictions Weight Bearing Restrictions: Yes RUE Weight Bearing: Weight bearing as tolerated Other Position/Activity Restrictions: WBAT on Rt UE per ORTHO note Pain: Pain Assessment Pain Assessment: No/denies pain Pain Score: 0-No pain Locomotion : Ambulation Ambulation/Gait Assistance: Not tested (comment)  Other Treatments: Treatments Neuromuscular Facilitation: Right;Left;Upper Extremity;Lower Extremity;Activity to increase lateral weight shifting;Activity to increase anterior-posterior weight shifting;Activity to increase sustained activation;Activity to increase coordination;Activity to increase motor control Manual Therapy Manual Therapy: Scapular mobilization Scapular Mobilization: with seated trunk rotation activity Weight Bearing Technique Weight Bearing Technique: Yes RUE Weight Bearing Technique: Forearm seated LUE Weight Bearing Technique:  Forearm seated  See FIM for current functional status  Therapy/Group: Individual Therapy  Lillia Abed. Cricket Goodlin, PT, DPT 09/18/2013, 10:55 AM

## 2013-09-18 NOTE — Progress Notes (Signed)
Occupational Therapy Weekly Progress Note  Patient Details  Name: Austin [name] MRN: [id number] Date of Birth: [birth year]  Beginning of progress report period: September 11, 2013 End of progress report period: September 18, 2013  Today's Date: 09/18/2013 OT Individual Time: 1101-1201 and 1400-1430 OT Individual Time Calculation (min): 60 min and 30 min     Patient has met 3 of 4 short term goals.  Patient has made good progress this reporting period, however no large functional gains. Patients is demonstrating improved postural control in sitting and standing, increasing response to multimodal cues to correct. He continues to require +2 assist for functional transfers and sit<>stand during self-care tasks. Patient has improved anterior weight shift to assist with LB self-care tasks, and sits unsupported in w/c up to 45 seconds with SBA. Patient is demonstrating improved sustained attention up to 60 seconds during self-care. Patient's greatest barriers are his pusher tendencies and and perceptual deficits. Patient is currently demonstrating behaviors consistent with [place]o Level VI.      Patient continues to demonstrate the following deficits: L hemiplegia, decreased balance, decreased postural control, decreased coordination, decreased balance strategies, decreased strength, decreased attention, decreased awareness, impaired overall cognition, impaired perceptual skills, and decreased activity tolerance and therefore will continue to benefit from skilled OT intervention to enhance overall performance with BADLs, postural control, balance, activity tolerance, and functional use of LUE.  Patient progressing toward long term goals..  Continue plan of care.  OT Short Term Goals Week 1:  OT Short Term Goal 1 (Week 1): Pt will sit EOB for 15 min during functional task with max assist OT Short Term Goal 1 - Progress (Week 1): Met OT Short Term Goal 2 (Week 1): Patient will visual scan to left to locate  2/3 self-care items with max multimodal cues OT Short Term Goal 2 - Progress (Week 1): Met OT Short Term Goal 3 (Week 1): Patient will follow motor command within 20 seconds during self-care task OT Short Term Goal 3 - Progress (Week 1): Partly met OT Short Term Goal 4 (Week 1): Patient initiate anterior weight shift in preparation for functional transfer with max cues.  OT Short Term Goal 4 - Progress (Week 1): Not met  Skilled Therapeutic Interventions/Progress Updates:    Session 1: Pt seen for ADL retraining with focus on initiation, postural control, functional transfers, activity tolerance, and sustained/selective attention. Pt received sitting in w/c. Completed bathing at shower level with +2 assist to transfer w/c<>rolling shower chair. Pt initiated washing 60% of body this AM. Completed dressing from w/c with use of tape down mirror as visual cue for postural control. Pt responded better to this visual and able to correct posture with min cues 75% of time without physical assist. Required +2 assist for sit<>stand 5x, achieving improved postural control in standing. Emphasis on anterior weight shift as pt assisted with reaching for pants and doffing R sock. Pt left sitting in w/c with LUE positioned on pillows and all needs in reach.   Session 2: Pt seen for 1:1 OT session with focus on functional transfers, postural control, and weight shifting. Pt received sitting in w/c. Pt with slight edema in LUE, provided retrograde massage with no indication of pain. Transferred w/c>bed (to left) with max assist +1 and max cues for maintaining anterior weight shift. Engaged in static and dynamic sitting balance activities with pt demonstrating static sitting 1:03 at SBA level and min cues for weight shift. Engaged in oral care while sitting EOB with min  assist for sitting balance d/t posterior lean. Engaged in reaching activities to facilitate weight shift to right with min-SBA assist. Pt fatigued at end of  session and required +2 assist to transfer bed>w/c. Pt left sitting in w/c with all needs in reach.   Therapy Documentation Precautions:  Precautions Precautions: Fall Precaution Comments: peg Required Braces or Orthoses: Cervical Brace (cervical brace d/c'd) Cervical Brace: Hard collar (cervical brace d/c'd) Other Brace/Splint: R wrist splint and L cock up splint Restrictions Weight Bearing Restrictions: Yes RUE Weight Bearing: Weight bearing as tolerated Other Position/Activity Restrictions: WBAT on Rt UE per ORTHO note General:   Vital Signs: Therapy Vitals Temp: 98.7 F (37.1 C) Temp src: Oral Pulse Rate: 78 Resp: 17 BP: 130/61 mmHg Oxygen Therapy SpO2: 98 % O2 Device: None (Room air) Pain:  No report of pain during therapy sessions.  See FIM for current functional status  Therapy/Group: Individual Therapy  Duayne Cal 09/18/2013, 7:27 AM

## 2013-09-18 NOTE — Progress Notes (Signed)
Speech Language Pathology Weekly Progress and Session Note  Patient Details  Name: Austin [name] MRN: [id number] Date of Birth: Jan 16, 1933  Beginning of progress report period: September 11, 2013 End of progress report period: September 18, 2013  Today's Date: 09/18/2013 SLP Individual Time: 0900-1000 SLP Individual Time Calculation (min): 60 min  Short Term Goals: Week 2: SLP Short Term Goal 1 (Week 2): Pt will demonstrate sustained attention to functional tasks for 2 minutes with Max multimodal cues.  SLP Short Term Goal 1 - Progress (Week 2): Met SLP Short Term Goal 2 (Week 2): Pt will initiate functional tasks with Mod A multimodal cues.  SLP Short Term Goal 2 - Progress (Week 2): Met SLP Short Term Goal 3 (Week 2): Pt will identify 2 cognitive and 2 physical deficit with Mod A multimodal cues.  SLP Short Term Goal 3 - Progress (Week 2): Met SLP Short Term Goal 4 (Week 2): Pt will orient to time with utilization of external memory aids with Mod A multimodal cues.  SLP Short Term Goal 4 - Progress (Week 2): Met SLP Short Term Goal 5 (Week 2): Pt will utilize an increased vocal intensity at the word level with Mod A multimodal cues.  SLP Short Term Goal 5 - Progress (Week 2): Met SLP Short Term Goal 6 (Week 2): Pt will consume trials of ice chips with minimal overt s/s of aspiration with Mod  A mutlimodal cues with 50% of trials.  SLP Short Term Goal 6 - Progress (Week 2): Not met    New Short Term Goals: Week 3: SLP Short Term Goal 1 (Week 3): Pt will utilize swallowing compensatory strategies with trials of puree textures and honey thick liquids to minimize overt s/s of aspiration with Mod A multimodal cues. SLP Short Term Goal 2 (Week 3): Pt will utilize an increased vocal intensity at the word level with Min A multimodal cues.  SLP Short Term Goal 3 (Week 3): Pt will utilize external memory aids to recall new, daily information with supervision multimodal cues.  SLP Short Term  Goal 4 (Week 3): Pt will identify 2 cognitive and 2 physical deficit with Min A multimodal cues.  SLP Short Term Goal 5 (Week 3): Pt will initiate functional tasks with Min A multimodal cues.  SLP Short Term Goal 6 (Week 3): Pt will demonstrate sustained attention to functional tasks for 20 minutes with Mod multimodal cues.   Weekly Progress Updates: Patient has made functional gains and has met 5 of 6 STG's this reporting period due to increased attention, awareness, ability to follow 1 step commands, working memory, orientation and swallow initiation. Currently, patient is demonstrating behaviors consistent with a Rancho Level VI and requires overall Mod A to perform functional and familiar tasks. Patient is demonstrating a timelier swallow initiation (~6 secs) and increased oral manipulation overall; however, he continues to demonstrate consistent coughing with trials of ice chips. Patient was able to tolerate trials of puree without overt s/s of aspiration, therefore, a FEES was administered on 09/18/13 to assess swallowing function. Recommend to continue NPO due to pharyngeal residuals with eventual penetration when swallowing compensatory strategies of multiple swallows and throat clears are not utilized which patient currently requires overall Max A multimodal cues, therefore, SLP will initiate trials of puree textures and honey thick liquids. Patient also continues to be dysphonic and requires Mod A multimodal cues for increased vocal intensity at the word level. Patient/family education ongoing and patient would benefit from continued skilled SLP  intervention to maximize his cognitive-linguistic and swallowing function and overall functional independence prior to discharge.   Intensity: Minumum of 1-2 x/day, 30 to 90 minutes Frequency: 5 out of 7 days Duration/Length of Stay: 3 weeks Treatment/Interventions: Cognitive remediation/compensation;Cueing hierarchy;Environmental  controls;Internal/external aids;Speech/Language facilitation;Therapeutic Activities;Patient/family education;Functional tasks;Dysphagia/aspiration precaution training   Daily Session  Skilled Therapeutic Interventions:  Skilled treatment session focused on cognitive-linguistic and dysphagia goals. Upon entering the room, patient was sitting in wheelchair and nurse was administering medication via PEG. Student facilitated session by providing supervision verbal cues for functional problem solving during oral care with suction toothbrush. Patient was administered trials of ice chips and puree textures and continues to demonstrate consistent coughing with trials of ice chips; however, patient was able to tolerate trials of puree without overt s/s of aspiration. Patient demonstrated a timelier swallow initiation (~6 secs) and increased oral manipulation with puree textures, therefore, recommend a FEES to assess swallow function. Student utilized external visual aids to increase recall of important information in regards to scheduling and patient required Min A cues for appropriate utilization. Student also facilitated session by providing Min-Mod A verbal, visual, and question cues for thought organization and sustained attention during writing activities. Reading comprehension and written expression appear [facility] for all tasks assessed. Patient left upright in the wheelchair with quick release belt in place and all needs within reach. Continue with current plan of care.       FIM:  Comprehension Comprehension Mode: Auditory Comprehension: 5-Understands complex 90% of the time/Cues < 10% of the time Expression Expression Mode: Verbal Expression: 4-Expresses basic 75 - 89% of the time/requires cueing 10 - 24% of the time. Needs helper to occlude trach/needs to repeat words. Social Interaction Social Interaction: 4-Interacts appropriately 75 - 89% of the time - Needs redirection for appropriate language or  to initiate interaction. Problem Solving Problem Solving: 4-Solves basic 75 - 89% of the time/requires cueing 10 - 24% of the time Memory Memory: 4-Recognizes or recalls 75 - 89% of the time/requires cueing 10 - 24% of the time FIM - Eating Eating Activity: 1: Helper feeds patient  Pain No/Denies pain  Therapy/Group: Individual Therapy  Trinidee Schrag 09/18/2013, 4:17 PM

## 2013-09-19 ENCOUNTER — Inpatient Hospital Stay (HOSPITAL_COMMUNITY): Payer: Medicare Other | Admitting: Speech Pathology

## 2013-09-19 ENCOUNTER — Inpatient Hospital Stay (HOSPITAL_COMMUNITY): Payer: Medicare Other | Admitting: *Deleted

## 2013-09-19 ENCOUNTER — Inpatient Hospital Stay (HOSPITAL_COMMUNITY): Payer: Medicare Other

## 2013-09-19 LAB — GLUCOSE, CAPILLARY
Glucose-Capillary: 103 mg/dL — ABNORMAL HIGH (ref 70–99)
Glucose-Capillary: 124 mg/dL — ABNORMAL HIGH (ref 70–99)
Glucose-Capillary: 84 mg/dL (ref 70–99)
Glucose-Capillary: 88 mg/dL (ref 70–99)

## 2013-09-19 MED ORDER — TIZANIDINE HCL 4 MG PO TABS
4.0000 mg | ORAL_TABLET | Freq: Three times a day (TID) | ORAL | Status: DC
  Administered 2013-09-19 – 2013-09-22 (×10): 4 mg via ORAL
  Filled 2013-09-19 (×12): qty 1

## 2013-09-19 NOTE — Progress Notes (Signed)
Chart and note reviewed. Agree with note.  Molli Barrows, RD, LDN, [place] Pager [phone] After Hours Pager [phone]

## 2013-09-19 NOTE — Progress Notes (Signed)
Speech Language Pathology Daily Session Note  Patient Details  Name: Austin [name] MRN: [id number] Date of Birth: 1932-11-13  Today's Date: 09/19/2013  Session 1 SLP Individual Time: 1100-1130 SLP Individual Time Calculation (min): 30 min  Session 2 SLP Individual Time: 1400-1500 SLP Individual Time Calculation (min): 60 min  Short Term Goals: Week 3: SLP Short Term Goal 1 (Week 3): Pt will utilize swallowing compensatory strategies with trials of puree textures and honey thick liquids to minimize overt s/s of aspiration with Mod A multimodal cues. SLP Short Term Goal 2 (Week 3): Pt will utilize an increased vocal intensity at the word level with Min A multimodal cues.  SLP Short Term Goal 3 (Week 3): Pt will utilize external memory aids to recall new, daily information with supervision multimodal cues.  SLP Short Term Goal 4 (Week 3): Pt will identify 2 cognitive and 2 physical deficit with Min A multimodal cues.  SLP Short Term Goal 5 (Week 3): Pt will initiate functional tasks with Min A multimodal cues.  SLP Short Term Goal 6 (Week 3): Pt will demonstrate sustained attention to functional tasks for 20 minutes with Mod multimodal cues.   Skilled Therapeutic Interventions:  Session 1: Skilled treatment session focused on cognitive-linguistic goals. Upon arrival, patient was awake while upright in his wheelchair. SLP facilitated session by providing total A multimodal cues for utilization of diaphragmatic breathing during basic vocal function exercises. Patient was able to sustain "ah" and "e" for ~2-3 seconds and demonstrated a hoarse vocal quality. Patient recalled results of yesterday's FEES with Min question cues and identified the "snack" he would like to consume in afternoon session with SLP from a field of 3 with Mod I. Pt also recalled events from previous therapy sessions with Mod  A multimodal cues. Patient left in wheelchair with quick release belt in place and all needs  within reach. Continue with current plan of care.    Session 2: Skilled treatment session focused on cognitive and dysphagia goals. Upon arrival, pt was lethargic while sitting upright in his wheelchair. SLP facilitated session by providing trials of Dys. 1 textures and honey-thick liquids. Patient self-fed trials and demonstrated a cough X 2 with ~2 oz of puree textures and ~2 oz of honey-thick liquids. Pt initiated a swallow between ~3-9 seconds and required overall Max A multimodal cues for utilization of compensatory strategies of multiple swallows and intermittent throat clear. Patient was lethargic throughout the session and required Mod A multimodal cues for sustained attention to self-feeding task. Patient left in room with quick release belt in place and all needs within reach. Continue with current plan of care.   FIM:  Comprehension Comprehension Mode: Auditory Comprehension: 4-Understands basic 75 - 89% of the time/requires cueing 10 - 24% of the time Expression Expression Mode: Verbal Expression: 4-Expresses basic 75 - 89% of the time/requires cueing 10 - 24% of the time. Needs helper to occlude trach/needs to repeat words. Social Interaction Social Interaction: 4-Interacts appropriately 75 - 89% of the time - Needs redirection for appropriate language or to initiate interaction. Problem Solving Problem Solving: 2-Solves basic 25 - 49% of the time - needs direction more than half the time to initiate, plan or complete simple activities Memory Memory: 3-Recognizes or recalls 50 - 74% of the time/requires cueing 25 - 49% of the time FIM - Eating Eating Activity: 5: Needs verbal cues/supervision  Pain Pain Assessment Pain Assessment: No/denies pain Pain Score: 0-No pain  Therapy/Group: Individual Therapy  Austin [name]  09/19/2013, 3:57 PM

## 2013-09-19 NOTE — Plan of Care (Signed)
Problem: RH BLADDER ELIMINATION Goal: RH STG MANAGE BLADDER WITH ASSISTANCE STG Manage Bladder With mod Assistance  Outcome: Not Progressing Requiring intermittent straight cath for urinary retention.

## 2013-09-19 NOTE — Progress Notes (Addendum)
Physical Therapy Session Note  Patient Details  Name: Austin [name] MRN: [id number] Date of Birth: [birth year]  Today's Date: 09/19/2013 PT Co-Treatment Time: 0830-0900 (co-tx with PT from 0800-0900) PT Co-Treatment Time Calculation (min): 30 min  Short Term Goals: Week 3:  PT Short Term Goal 1 (Week 3): Patient will perform bed mobility with maxA x1. PT Short Term Goal 2 (Week 3): Patient will perform unsupported static sitting balance with supervision/verbal cues. PT Short Term Goal 3 (Week 3): Patient will perform gait training 66' with +2 assist.  Skilled Therapeutic Interventions/Progress Updates:  See tx note by Kennieth Rad, PT, DPT regarding first half of tx session.  Focus this session on midline orientation, dynamic sitting balance, static standing, postural control and functional transfers. Pt engaged in bean bag toss sitting EOM w/ mirror in front w/ midline marked for visual feedback. Pt alternated reaching to L and R sides w/ R UE for emphasis on B and anterior weight shifting, returning to midline and emergent awareness of body position. Activity repeated with pt reaching towards bean bag, then holding 3seconds for postural control before returning to midline. Pt req close(S)-mod A for sitting balance, with therapist facilitating WB through L UE throughout task.   Pt engaged in t/f sit<>stand and static standing 95minx2 w/ Ax2 persons. Use of Giv-Mor sling for L UE support. Emphasis on postural extension, midline positioning>weightshift to R and emergent awareness. PTs facilitating posture and weightshifting.   Ax2 persons for squat pivot t/f tx mat>w/c. Pt left sitting in basic w/c at end of session to assess sitting tolerance and postural control during 47min break until next session w/ OT. Pt left w/ quick release belt in place and all needs in reach.   Therapy Documentation Precautions:  Precautions Precautions: Fall Precaution Comments: peg Required Braces or  Orthoses: Cervical Brace (cervical brace d/c'd) Cervical Brace: Hard collar (cervical brace d/c'd) Other Brace/Splint: R wrist splint and L cock up splint Restrictions Weight Bearing Restrictions: Yes RUE Weight Bearing: Weight bearing as tolerated Other Position/Activity Restrictions: WBAT on Rt UE per ORTHO note   See FIM for current functional status  Therapy/Group: Co-Treatment  Otis Brace S 09/19/2013, 12:07 PM

## 2013-09-19 NOTE — Progress Notes (Signed)
Physical Therapy Session Note  Patient Details  Name: Austin [name] MRN: [id number] Date of Birth: 07-24-1932  Today's Date: 09/19/2013 PT Individual Time:  8:00-8:30 (31min) - Co-treat with PT, 8:00-9:00 (43min total)  Short Term Goals: Week 3:  PT Short Term Goal 1 (Week 3): Patient will perform bed mobility with maxA x1. PT Short Term Goal 2 (Week 3): Patient will perform unsupported static sitting balance with supervision/verbal cues. PT Short Term Goal 3 (Week 3): Patient will perform gait training 80' with +2 assist.  Skilled Therapeutic Interventions/Progress Updates:  Co-treat with PT for functional mobility training and NMR via forced use, manual facilitation, and multi-modal cues. Pt resting in bed upon arrival, no c/o pain except L shoulder as tx progressed. Provided Give-Mohr sling for support with increased comfort reported.   Performed R rolling with set-up for LLE and assist at trunk, max A overall. Pt needed assist for LE lowering and trunk lifting with cues for anterior and R weight shifting. Pt continues to demonstrate leaning/pushing, but has increased ability to correct with error cues. Pt able to use cues to reach with RUE to target to correct balance as well as manual facilitation and mirror for visual feedback. Pt has difficulty maintaining trunk control during functional mobility as well.   Pt able to sit EOB and edge of mat with close SBA for up to 32min at a time, but demonstrated posterior and L LOB during dynamic tasks, needing up to Mod A to recover.   Performed stand/squat/pivot transfers to pt's R with +2 assist for anterior trunk control, completing turn and lifting hips. Bed>WC>mat>WC. LLE extensor tone requires assist to maintain placement during transfer. LUE placed in lap for comfort and safety. See additional PT note for dynamic sitting balance and standing tasks.        Therapy Documentation Precautions:  Precautions Precautions: Fall Precaution  Comments: peg Required Braces or Orthoses: Cervical Brace (cervical brace d/c'd) Cervical Brace: Hard collar (cervical brace d/c'd) Other Brace/Splint: R wrist splint and L cock up splint Restrictions Weight Bearing Restrictions: Yes RUE Weight Bearing: Weight bearing as tolerated Other Position/Activity Restrictions: WBAT on Rt UE per ORTHO note    Pain: Pain Assessment Pain Assessment: No/denies pain Pain Score: 0-No pain  See FIM for current functional status  Therapy/Group: Co-Treatment Kennieth Rad, PT, DPT  09/19/2013, 12:36 PM

## 2013-09-19 NOTE — Progress Notes (Signed)
Occupational Therapy Session Note  Patient Details  Name: Austin [name] MRN: [id number] Date of Birth: 03-Aug-1932  Today's Date: 09/19/2013 OT Individual Time: [id number] OT Individual Time Calculation (min): 61 min    Short Term Goals: Week 3:  OT Short Term Goal 1 (Week 3): Patient will complete toilet or shower transfer to rolling showre chair with max assist +1 OT Short Term Goal 2 (Week 3): Patient will sit unsupported at supervision level for 45 seconds during functional task  OT Short Term Goal 3 (Week 3): Patient will demonstrate selective attention for 15 seconds with min cues in busy environment OT Short Term Goal 4 (Week 3): Patient will stand at sink with max assist to complete 1 LB self-care task for 10 seconds  Skilled Therapeutic Interventions/Progress Updates:    Pt seen for ADL retraining with focus on functional transfers, postural control in sitting and standing, static sitting balance, and initiation. Pt received sitting in w/c and requesting to go to bathroom. Required +2 assist for squat pivot transfer w/c<>toilet. Pt initially required min assist for static sitting balance on toilet then progressed to SBA up to 3 min with min cues for weight shift. Pt with continent episode of BM. Required +2 for hygiene. Engaged in hand washing at sink with pt initiating anterior weight shift to turn water on/off and require assist for positioning only of LUE. Completed bathing and dressing at sink with pt demonstrating sustained attention to task up to 35 seconds. Emphasis on anterior weight shift, with pt required min-mod assist for initiation 50% of time. Completed sit<>stand 3x with +2 assist and mod cues for use of mirror for postural control. Pt assisting with managing clothing in standing. Pt emotional 1x during session d/t awareness of deficits, however, easily redirected after providing emotional support. Provided retrograde massage to LUE for edema management and elevated on  pillows. Pt left with all needs in reach.   Therapy Documentation Precautions:  Precautions Precautions: Fall Precaution Comments: peg Required Braces or Orthoses: Cervical Brace (cervical brace d/c'd) Cervical Brace: Hard collar (cervical brace d/c'd) Other Brace/Splint: R wrist splint and L cock up splint Restrictions Weight Bearing Restrictions: Yes RUE Weight Bearing: Weight bearing as tolerated Other Position/Activity Restrictions: WBAT on Rt UE per ORTHO note General:   Vital Signs:   Pain: Pain Assessment Pain Assessment: No/denies pain Pain Score: 0-No pain  See FIM for current functional status  Therapy/Group: Individual Therapy  Duayne Cal 09/19/2013, 10:59 AM
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
## 2013-09-19 NOTE — Progress Notes (Signed)
Physical Therapy Session Note  Patient Details  Name: Austin [name] MRN: [id number] Date of Birth: Jul 10, 1932  Today's Date: 09/19/2013 PT Individual Time: 1500-1600 PT Individual Time Calculation (min): 60 min   Short Term Goals: Week 3:  PT Short Term Goal 1 (Week 3): Patient will perform bed mobility with maxA x1. PT Short Term Goal 2 (Week 3): Patient will perform unsupported static sitting balance with supervision/verbal cues. PT Short Term Goal 3 (Week 3): Patient will perform gait training 22' with +2 assist.  Skilled Therapeutic Interventions/Progress Updates:    Patient received in bathroom with RN and nurse tech transferring to toilet. Patient continent of bowel, requires +2 to stand, third helper to perform hygiene and clothing management. Session focused on functional transfer, postural control in sitting and perch position, weight bearing through B LEs. Sitting EOM, emphasis on upright posture and midline orientation using R hand on wall to facilitate, patient requires minA initially, progressing to SBA. Patient progressed to perch position in [place] to increase weight bearing through B LEs, using wall on R side to reach for to facilitate increased weight bearing through L LE; also emphasized core/trunk NMR to return to midline after reaching. Patient returned to room and left sitting in wheelchair with seatbelt donned and all needs within reach.  Therapy Documentation Precautions:  Precautions Precautions: Fall Precaution Comments: peg Required Braces or Orthoses: Cervical Brace (cervical brace d/c'd) Cervical Brace: Hard collar (cervical brace d/c'd) Other Brace/Splint: R wrist splint and L cock up splint Restrictions Weight Bearing Restrictions: Yes RUE Weight Bearing: Weight bearing as tolerated Other Position/Activity Restrictions: WBAT on Rt UE per ORTHO note Pain: Pain Assessment Pain Assessment: No/denies pain Pain Score: 0-No pain Locomotion  : Ambulation Ambulation/Gait Assistance: Not tested (comment)   See FIM for current functional status  Therapy/Group: Individual Therapy  Lillia Abed. Kishana Battey, PT, DPT 09/19/2013, 4:02 PM

## 2013-09-20 ENCOUNTER — Inpatient Hospital Stay (HOSPITAL_COMMUNITY): Payer: Medicare Other | Admitting: Speech Pathology

## 2013-09-20 ENCOUNTER — Encounter (HOSPITAL_COMMUNITY): Payer: Medicare Other | Admitting: Occupational Therapy

## 2013-09-20 DIAGNOSIS — IMO0002 Reserved for concepts with insufficient information to code with codable children: Secondary | ICD-10-CM

## 2013-09-20 DIAGNOSIS — I10 Essential (primary) hypertension: Secondary | ICD-10-CM

## 2013-09-20 LAB — GLUCOSE, CAPILLARY
Glucose-Capillary: 95 mg/dL (ref 70–99)
Glucose-Capillary: 162 mg/dL — ABNORMAL HIGH (ref 70–99)
Glucose-Capillary: 97 mg/dL (ref 70–99)
Glucose-Capillary: 101 mg/dL — ABNORMAL HIGH (ref 70–99)

## 2013-09-20 NOTE — Progress Notes (Signed)
Speech Language Pathology Daily Session Note  Patient Details  Name: OTIE KALLENBACH MRN: [id number] Date of Birth: 06-04-1932  Today's Date: 09/20/2013 SLP Individual Time: 1330-1400 SLP Individual Time Calculation (min): 30 min  Short Term Goals: Week 3: SLP Short Term Goal 1 (Week 3): Pt will utilize swallowing compensatory strategies with trials of puree textures and honey thick liquids to minimize overt s/s of aspiration with Mod A multimodal cues. SLP Short Term Goal 2 (Week 3): Pt will utilize an increased vocal intensity at the word level with Min A multimodal cues.  SLP Short Term Goal 3 (Week 3): Pt will utilize external memory aids to recall new, daily information with supervision multimodal cues.  SLP Short Term Goal 4 (Week 3): Pt will identify 2 cognitive and 2 physical deficit with Min A multimodal cues.  SLP Short Term Goal 5 (Week 3): Pt will initiate functional tasks with Min A multimodal cues.  SLP Short Term Goal 6 (Week 3): Pt will demonstrate sustained attention to functional tasks for 20 minutes with Mod multimodal cues.   Skilled Therapeutic Interventions: Skilled treatment session focused on dysphagia goals. Upon arrival, pt was awake while sitting upright in his wheelchair. SLP facilitated session by providing Max A multimodal cues which faded to Mod by end of session for utilization of swallowing compensatory strategies of multiple bites and intermittent throat clear with trials of Dys. 1 textures with honey-thick liquids. Patient demonstrate overt cough X 1 during consumption of ~3 oz of honey-thick liquids and ~2 oz of puree textures. Patient demonstrated a suspected delayed swallow initiation (~3-9 secs) and independently utilized small bites and but required Mod multimodal cues to self-monitor and correct left anterior spillage. Patient left upright in wheelchair with quick release belt in place and wife present. Continue with current plan of care.    FIM:   Comprehension Comprehension Mode: Auditory Comprehension: 5-Understands basic 90% of the time/requires cueing < 10% of the time Expression Expression Mode: Verbal Expression: 4-Expresses basic 75 - 89% of the time/requires cueing 10 - 24% of the time. Needs helper to occlude trach/needs to repeat words. Social Interaction Social Interaction: 4-Interacts appropriately 75 - 89% of the time - Needs redirection for appropriate language or to initiate interaction. Problem Solving Problem Solving: 2-Solves basic 25 - 49% of the time - needs direction more than half the time to initiate, plan or complete simple activities Memory Memory: 4-Recognizes or recalls 75 - 89% of the time/requires cueing 10 - 24% of the time FIM - Eating Eating Activity: 5: Needs verbal cues/supervision  Pain Pain Assessment Pain Assessment: No/denies pain  Therapy/Group: Individual Therapy  Dontee Jaso 09/20/2013, 3:02 PM

## 2013-09-20 NOTE — Progress Notes (Signed)
Occupational Therapy Session Note  Patient Details  Name: Austin [name] MRN: [id number] Date of Birth: 23-Dec-1932  Today's Date: 09/20/2013 OT Individual Time: [id number] OT Individual Time Calculation (min): 64 min    Short Term Goals: Week 3:  OT Short Term Goal 1 (Week 3): Patient will complete toilet or shower transfer to rolling showre chair with max assist +1 OT Short Term Goal 2 (Week 3): Patient will sit unsupported at supervision level for 45 seconds during functional task  OT Short Term Goal 3 (Week 3): Patient will demonstrate selective attention for 15 seconds with min cues in busy environment OT Short Term Goal 4 (Week 3): Patient will stand at sink with max assist to complete 1 LB self-care task for 10 seconds  Skilled Therapeutic Interventions/Progress Updates:    Pt performed bathing and dressing sit to stand at the sink.  He needed max assist for dynamic sitting balance while bathing with frequent pushing and LOB to the left side.  Worked on maintaining neutral midline alignment while engaged in UB bathing however pt unable to perform without max facilitation.  Positioned wash pan forward and to the right of the pt in order to incorporate weightshift over the right hip while reaching.  Total assist needed to incorporate the LUE as a stabilizer for holding the washcloth while applying liquid soap as well as when using the UE to wash the right arm.  Sit to stand total +2 (pt 30%) with increased pushing to the left and decreased activation of the left knee.  Noted pt's LLE activating into flexor withdrawal with increased pushing from the right LE in standing resulting in the left foot lifting off of the floor.  Began education on hemidressing techniques.  Pt needing max assist and max instructional cueing for threading the shirt over his left arm.  He was able to donn it over the left but required assist to pull over his head and down over his trunk.  Pt able to maintain sustained  attention throughout task.  Therapist and rehab tech assisted with donning shoes and socks secondary to time.  Applied resting hand splint to the LUE as well as providing left 1/2 lap tray for support and better trunk alignment in the wheelchair.   Therapy Documentation Precautions:  Precautions Precautions: Fall Precaution Comments: peg Required Braces or Orthoses: Cervical Brace (cervical brace d/c'd) Cervical Brace: Hard collar (cervical brace d/c'd) Other Brace/Splint: R wrist splint and L cock up splint Restrictions Weight Bearing Restrictions: No RUE Weight Bearing: Weight bearing as tolerated Other Position/Activity Restrictions: WBAT on Rt UE per ORTHO note  Pain: Pain Assessment Pain Assessment: Faces Faces Pain Scale: Hurts a little bit Pain Type: Chronic pain Pain Location: Shoulder Pain Orientation: Left Pain Intervention(s): Repositioned;Emotional support ADL: See FIM for current functional status  Therapy/Group: Individual Therapy  Evelyne Makepeace,Willam OTR/L 09/20/2013, 12:49 PM
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
## 2013-09-21 ENCOUNTER — Encounter (HOSPITAL_COMMUNITY): Payer: Medicare Other | Admitting: Occupational Therapy

## 2013-09-21 LAB — GLUCOSE, CAPILLARY
Glucose-Capillary: 127 mg/dL — ABNORMAL HIGH (ref 70–99)
Glucose-Capillary: 160 mg/dL — ABNORMAL HIGH (ref 70–99)
Glucose-Capillary: 201 mg/dL — ABNORMAL HIGH (ref 70–99)
Glucose-Capillary: 89 mg/dL (ref 70–99)

## 2013-09-21 NOTE — Progress Notes (Signed)
Occupational Therapy Session Note  Patient Details  Name: Austin [name] MRN: [id number] Date of Birth: 1932/07/11  Today's Date: 09/21/2013 OT Individual Time: 8891-6945 OT Individual Time Calculation (min): 61 min    Short Term Goals: Week 3:  OT Short Term Goal 1 (Week 3): Patient will complete toilet or shower transfer to rolling showre chair with max assist +1 OT Short Term Goal 2 (Week 3): Patient will sit unsupported at supervision level for 45 seconds during functional task  OT Short Term Goal 3 (Week 3): Patient will demonstrate selective attention for 15 seconds with min cues in busy environment OT Short Term Goal 4 (Week 3): Patient will stand at sink with max assist to complete 1 LB self-care task for 10 seconds  Skilled Therapeutic Interventions/Progress Updates:    Patient seen this am for OT intervention to address postural control, sustained attention, initiation, and attention to left side of body during basic self care tasks.  Patient initially with poor postural control in sitting at edge of bed, however with positioning of feet on floor, and guided weight shifts forward and toward left side, patient better able to assist with squat pivot transfer to left side (level surface.)  Encouraged trunk activation for forward weight shifts while seated at sink in preparation for sit to stand for LB bathing and dressing.  Patient required frequent cueing to visually locate left hand / arm during ADL.  Patient required frequent redirection to task, or next step of task, but once directed remained on task for several seconds at a time.  Patient able to carry over memory assignment from Friday from his primary SLP!    Therapy Documentation Precautions:  Precautions Precautions: Fall Precaution Comments: peg Required Braces or Orthoses: Cervical Brace (cervical brace d/c'd) Cervical Brace: Hard collar (cervical brace d/c'd) Other Brace/Splint: R wrist splint and L cock up  splint Restrictions Weight Bearing Restrictions: Yes RUE Weight Bearing: Weight bearing as tolerated Other Position/Activity Restrictions: WBAT on Rt UE per ORTHO note     Pain:  No indication of pain       See FIM for current functional status  Therapy/Group: Individual Therapy  Mariah Milling 09/21/2013, 12:22 PM
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
## 2013-09-22 ENCOUNTER — Inpatient Hospital Stay (HOSPITAL_COMMUNITY): Payer: Medicare Other | Admitting: *Deleted

## 2013-09-22 ENCOUNTER — Inpatient Hospital Stay (HOSPITAL_COMMUNITY): Payer: Medicare Other

## 2013-09-22 ENCOUNTER — Inpatient Hospital Stay (HOSPITAL_COMMUNITY): Payer: Medicare Other | Admitting: Speech Pathology

## 2013-09-22 ENCOUNTER — Encounter (HOSPITAL_COMMUNITY): Payer: Medicare Other

## 2013-09-22 DIAGNOSIS — S069X9A Unspecified intracranial injury with loss of consciousness of unspecified duration, initial encounter: Secondary | ICD-10-CM

## 2013-09-22 DIAGNOSIS — S069XAA Unspecified intracranial injury with loss of consciousness status unknown, initial encounter: Secondary | ICD-10-CM

## 2013-09-22 DIAGNOSIS — Z5189 Encounter for other specified aftercare: Secondary | ICD-10-CM

## 2013-09-22 LAB — CBC
HCT: 36.2 % — ABNORMAL LOW (ref 39.0–52.0)
Hemoglobin: 11.7 g/dL — ABNORMAL LOW (ref 13.0–17.0)
MCH: 30.8 pg (ref 26.0–34.0)
MCHC: 32.3 g/dL (ref 30.0–36.0)
MCV: 95.3 fL (ref 78.0–100.0)
Platelets: 217 K/uL (ref 150–400)
RBC: 3.8 MIL/uL — ABNORMAL LOW (ref 4.22–5.81)
RDW: 15.2 % (ref 11.5–15.5)
WBC: 8.6 K/uL (ref 4.0–10.5)

## 2013-09-22 LAB — BASIC METABOLIC PANEL WITH GFR
Anion gap: 11 (ref 5–15)
BUN: 29 mg/dL — ABNORMAL HIGH (ref 6–23)
CO2: 25 meq/L (ref 19–32)
Calcium: 9 mg/dL (ref 8.4–10.5)
Chloride: 100 meq/L (ref 96–112)
Creatinine, Ser: 0.94 mg/dL (ref 0.50–1.35)
GFR calc Af Amer: >90 mL/min (ref >90)
GFR calc non Af Amer: 78 mL/min — ABNORMAL LOW (ref >90)
Glucose, Bld: 99 mg/dL (ref 70–99)
Potassium: 4.3 meq/L (ref 3.7–5.3)
Sodium: 136 meq/L — ABNORMAL LOW (ref 137–147)

## 2013-09-22 LAB — GLUCOSE, CAPILLARY
Glucose-Capillary: 116 mg/dL — ABNORMAL HIGH (ref 70–99)
Glucose-Capillary: 119 mg/dL — ABNORMAL HIGH (ref 70–99)
Glucose-Capillary: 110 mg/dL — ABNORMAL HIGH (ref 70–99)
Glucose-Capillary: 109 mg/dL — ABNORMAL HIGH (ref 70–99)

## 2013-09-22 MED ORDER — TIZANIDINE HCL 4 MG PO TABS
4.0000 mg | ORAL_TABLET | Freq: Three times a day (TID) | ORAL | Status: DC
  Administered 2013-09-22 – 2013-10-03 (×42): 4 mg
  Filled 2013-09-22 (×47): qty 1

## 2013-09-22 MED ORDER — TIZANIDINE HCL 4 MG PO TABS: 4.0000 mg | ORAL_TABLET | Freq: Three times a day (TID) | ORAL | Status: DC

## 2013-09-22 NOTE — Progress Notes (Signed)
Recreational Therapy Session Note  Patient Details  Name: OMAR LICON MRN: [id number] Date of Birth: October 05, 1932 Today's Date: 09/22/2013  Pain: no c/o Skilled Therapeutic Interventions/Progress Updates: Session focused on activity tolerance, dynamic sitting balance, weight shifting while in STEADY for horseshoe activity.  Pt reaching for horseshoes to facilitate weight shifting & sitting at midline with max cues.  Therapy/Group: Co-Treatment  Gilliam Hawkes 09/22/2013, 12:21 PM

## 2013-09-22 NOTE — Progress Notes (Signed)
Physical Therapy Session Note  Patient Details  Name: Austin [name] MRN: [id number] Date of Birth: [birth year]  Today's Date: 09/22/2013 PT Individual Time: 1500-1600 PT Individual Time Calculation (min): 60 min   Short Term Goals: Week 3:  PT Short Term Goal 1 (Week 3): Patient will perform bed mobility with maxA x1. PT Short Term Goal 2 (Week 3): Patient will perform unsupported static sitting balance with supervision/verbal cues. PT Short Term Goal 3 (Week 3): Patient will perform gait training 26' with +2 assist.  Skilled Therapeutic Interventions/Progress Updates:  1:1. Pt received sitting in w/c in bathroom w/ nurse tech's in room. Nurse tech's verbalized having difficulty transferring pt's to toilet w/ use of Steady or squat pivot t/f due to tone. PT stepping into assist. Trial use of steady, however, pt with significantly L lean due to pushing making use extremely unsafe. Recommended use of Sara +. Pt much more stable in [name] + w/ minimal increase in pushing, Ax2persons for safety during t/f w/c<>toilet. Total A for clean up due to incontinence as safe completion of toilet transfer required increased time. Focus remainder of session on w/c propulsion and NMR in sitting. Pt propelled w/c 125'x2 w/ R LE only as pt demonstrates difficulty seq use of R UE/LE. Pt req tactile cues for increased strike and prolonged pull for more efficient propulsion, req max A overall. Ax2 persons for squat pivot t/f w/c<>tx mat. Pt seated on elevated tx mat w/out R LE touching floor and L LE supported by 6" step to facilitate increased WB through L LE, core activation and prevention of pushing w/ R LE while reaching to all quadrants to grasp bean bags w/ R UE at edge of BOS to facilitate trunk control and weightshifting. Pt req min guard-min A for sitting balance during task. Pt sitting in w/c at end of session w/ all needs in reach.   Therapy Documentation Precautions:  Precautions Precautions:  Fall Precaution Comments: peg Required Braces or Orthoses: Cervical Brace (cervical brace d/c'd) Cervical Brace: Hard collar (cervical brace d/c'd) Other Brace/Splint: R wrist splint and L cock up splint Restrictions Weight Bearing Restrictions: Yes RUE Weight Bearing: Weight bearing as tolerated LUE Weight Bearing: Weight bearing as tolerated RLE Weight Bearing: Weight bearing as tolerated LLE Weight Bearing: Weight bearing as tolerated Other Position/Activity Restrictions: WBAT on Rt UE per ORTHO note   Pain: Pain Assessment Pain Assessment: No/denies pain  See FIM for current functional status  Therapy/Group: Individual Therapy  [name] 09/22/2013, 5:15 PM

## 2013-09-22 NOTE — Progress Notes (Signed)
Physical Therapy Session Note  Patient Details  Name: Austin [name] MRN: [id number] Date of Birth: October 19, 1932  Today's Date: 09/22/2013 PT Individual Time: 0800-0900 and 1100-1130  PT Individual Time Calculation (min): 60 min and 30 min  Short Term Goals: Week 3:  PT Short Term Goal 1 (Week 3): Patient will perform bed mobility with maxA x1. PT Short Term Goal 2 (Week 3): Patient will perform unsupported static sitting balance with supervision/verbal cues. PT Short Term Goal 3 (Week 3): Patient will perform gait training 51' with +2 assist.  Skilled Therapeutic Interventions/Progress Updates:    First session: Patient received supine in bed. Session focused on functional transfers, wheelchair mobility, sit<>stand transfers, postural control in standing, and L LE weight bearing. Patient requires +2 for all mobility this session. Standing with with R handrail and high table emphasis on lateral weight shifts, attempting pre-gait stepping with R LE, however, patient unable to motor plan unweighting and advancement of R LE. L LE continues to demonstrate flexor tone making it difficult to position during standing and requires approximation to facilitate weigh bearing.   Kinetron activity attempted in standing to facilitate weight shifts and unweighting of R LE to simulate gait, patient continues to demonstrate extensor tone R LE with flexor tone L LE; activity transitioned to sitting and patient able to activate L LE to push down when R LE removed from activity.  Second session: Patient received sitting in wheelchair. Perch position in [place] to increase weight bearing through B LEs, using wall on R side to reach for to facilitate increased weight bearing through L LE; also emphasized core/trunk NMR to return to midline after reaching for horseshoes. Patient returned to room and left sitting in wheelchair with seatbelt donned and all needs within reach; L half lap tray in position.  Therapy  Documentation Precautions:  Precautions Precautions: Fall Precaution Comments: peg Other Brace/Splint: R wrist splint and L cock up splint Restrictions Weight Bearing Restrictions: Yes RUE Weight Bearing: Weight bearing as tolerated Other Position/Activity Restrictions: WBAT on Rt UE per ORTHO note Pain: Pain Assessment Pain Assessment: No/denies pain Pain Score: 0-No pain Locomotion : Ambulation Ambulation/Gait Assistance: Not tested (comment)   See FIM for current functional status  Therapy/Group: Co-Treatment with PT during first session and Rec therapy during second session  [place]. [place] Freshour, PT, DPT 09/22/2013, 11:32 AM

## 2013-09-22 NOTE — Progress Notes (Signed)
Speech Language Pathology Daily Session Note  Patient Details  Name: Austin [name] MRN: [id number] Date of Birth: 1932-12-03  Today's Date: 09/22/2013 SLP Individual Time: 1355-1455 SLP Individual Time Calculation (min): 60 min  Short Term Goals: Week 3: SLP Short Term Goal 1 (Week 3): Pt will utilize swallowing compensatory strategies with trials of puree textures and honey thick liquids to minimize overt s/s of aspiration with Mod A multimodal cues. SLP Short Term Goal 2 (Week 3): Pt will utilize an increased vocal intensity at the word level with Min A multimodal cues.  SLP Short Term Goal 3 (Week 3): Pt will utilize external memory aids to recall new, daily information with supervision multimodal cues.  SLP Short Term Goal 4 (Week 3): Pt will identify 2 cognitive and 2 physical deficit with Min A multimodal cues.  SLP Short Term Goal 5 (Week 3): Pt will initiate functional tasks with Min A multimodal cues.  SLP Short Term Goal 6 (Week 3): Pt will demonstrate sustained attention to functional tasks for 20 minutes with Mod multimodal cues.   Skilled Therapeutic Interventions: Skilled treatment session focused on dysphagia and cognitive-linguistic goals. SLP facilitated session with set-up assist  for oral care via suctioning, increased wait time to perform through oral care and Supervision verbal cues for finger occlusion.  SLP also facilitated session by setting up written external aid to promote recall and carryover of safe swallow strategies, which patient was able to Independently verbalize, but required Max assist multimodal cues faded to Mod for utilization (especially swallow following throat clear).  Patient consumed 4oz of puree and 1oz of honey-thick liquids with cough x3 due to distraction from completing safe swallow strategies.  Patient attend to left anterior loss with Supervision cues but required Max cues to initiate bites throughout session.  Continue with current plan of  care.    FIM:  Comprehension Comprehension Mode: Auditory Comprehension: 5-Understands basic 90% of the time/requires cueing < 10% of the time Expression Expression Mode: Verbal Expression: 4-Expresses basic 75 - 89% of the time/requires cueing 10 - 24% of the time. Needs helper to occlude trach/needs to repeat words. Social Interaction Social Interaction: 4-Interacts appropriately 75 - 89% of the time - Needs redirection for appropriate language or to initiate interaction. Problem Solving Problem Solving: 2-Solves basic 25 - 49% of the time - needs direction more than half the time to initiate, plan or complete simple activities Memory Memory: 4-Recognizes or recalls 75 - 89% of the time/requires cueing 10 - 24% of the time FIM - Eating Eating Activity: 5: Needs verbal cues/supervision (with trials)  Pain Pain Assessment Pain Assessment: No/denies pain Pain Score: 0-No pain  Therapy/Group: Individual Therapy  [name]., [place] [id number]  [place] 09/22/2013, 2:25 PM

## 2013-09-22 NOTE — Progress Notes (Signed)
Occupational Therapy Session Note  Patient Details  Name: Austin [name] MRN: [id number] Date of Birth: 08-25-1932  Today's Date: 09/22/2013 OT Individual Time: [id number] OT Individual Time Calculation (min): 60 min    Short Term Goals: Week 3:  OT Short Term Goal 1 (Week 3): Patient will complete toilet or shower transfer to rolling showre chair with max assist +1 OT Short Term Goal 2 (Week 3): Patient will sit unsupported at supervision level for 45 seconds during functional task  OT Short Term Goal 3 (Week 3): Patient will demonstrate selective attention for 15 seconds with min cues in busy environment OT Short Term Goal 4 (Week 3): Patient will stand at sink with max assist to complete 1 LB self-care task for 10 seconds  Skilled Therapeutic Interventions/Progress Updates:    Pt seen for ADL retraining with focus on midline orientation, standing balance, functional transfers, attention, and initiation. Pt received sitting in w/c. Completed bathing at sink with use of mirror as visual feedback for postural control. Placed wash basin on right side to facilitate lateral weight shift. Provided hand over hand assist to LUE with cues to visually attend to LUE while washing RUE. Completed sit<>stand 6-7x with +2 assist to manage pusher tendencies and pt with flexor withdrawal in LLE. Pt assisted with hygiene, requiring assist from therapist for thoroughness. Pt required min cues for anterior weight shift during LB self-care and sit<>stand. Pt initiating need to toilet this AM. Required +2 assist for squat pivot transfer d/t pusher tendencies and impaired perception. Pt required SBA for static sitting balance on toilet. Pt demonstrating sustained attention up to 25 seconds this AM and able to easily redirect to task. Pt left sitting in w/c with QRB donned and all needs in reach.   Therapy Documentation Precautions:  Precautions Precautions: Fall Precaution Comments: peg Required Braces or  Orthoses: Cervical Brace (cervical brace d/c'd) Cervical Brace: Hard collar (cervical brace d/c'd) Other Brace/Splint: R wrist splint and L cock up splint Restrictions Weight Bearing Restrictions: Yes RUE Weight Bearing: Weight bearing as tolerated LUE Weight Bearing: Weight bearing as tolerated RLE Weight Bearing: Weight bearing as tolerated LLE Weight Bearing: Weight bearing as tolerated Other Position/Activity Restrictions: WBAT on Rt UE per ORTHO note General:   Vital Signs:   Pain: Pain Assessment Pain Assessment: No/denies pain Pain Score: 0-No pain See FIM for current functional status  Therapy/Group: Individual Therapy  Duayne Cal 09/22/2013, 12:07 PM
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
## 2013-09-23 ENCOUNTER — Inpatient Hospital Stay (HOSPITAL_COMMUNITY): Payer: Medicare Other

## 2013-09-23 ENCOUNTER — Inpatient Hospital Stay (HOSPITAL_COMMUNITY): Payer: Medicare Other | Admitting: Speech Pathology

## 2013-09-23 ENCOUNTER — Inpatient Hospital Stay (HOSPITAL_COMMUNITY): Payer: Medicare Other | Admitting: *Deleted

## 2013-09-23 ENCOUNTER — Ambulatory Visit (HOSPITAL_COMMUNITY): Payer: Medicare Other

## 2013-09-23 ENCOUNTER — Encounter (HOSPITAL_COMMUNITY): Payer: Medicare Other

## 2013-09-23 DIAGNOSIS — S069X9A Unspecified intracranial injury with loss of consciousness of unspecified duration, initial encounter: Secondary | ICD-10-CM

## 2013-09-23 DIAGNOSIS — S069XAA Unspecified intracranial injury with loss of consciousness status unknown, initial encounter: Secondary | ICD-10-CM

## 2013-09-23 DIAGNOSIS — Z5189 Encounter for other specified aftercare: Secondary | ICD-10-CM

## 2013-09-23 LAB — GLUCOSE, CAPILLARY
Glucose-Capillary: 101 mg/dL — ABNORMAL HIGH (ref 70–99)
Glucose-Capillary: 157 mg/dL — ABNORMAL HIGH (ref 70–99)
Glucose-Capillary: 85 mg/dL (ref 70–99)
Glucose-Capillary: 107 mg/dL — ABNORMAL HIGH (ref 70–99)

## 2013-09-23 NOTE — Progress Notes (Signed)
Occupational Therapy Session Note  Patient Details  Name: Austin [name] MRN: [id number] Date of Birth: 02/10/1932  Today's Date: 09/23/2013 OT Individual Time: 1330-1430 OT Individual Time Calculation (min): 60 min    Short Term Goals: Week 3:  OT Short Term Goal 1 (Week 3): Patient will complete toilet or shower transfer to rolling showre chair with max assist +1 OT Short Term Goal 2 (Week 3): Patient will sit unsupported at supervision level for 45 seconds during functional task  OT Short Term Goal 3 (Week 3): Patient will demonstrate selective attention for 15 seconds with min cues in busy environment OT Short Term Goal 4 (Week 3): Patient will stand at sink with max assist to complete 1 LB self-care task for 10 seconds  Skilled Therapeutic Interventions/Progress Updates:    Pt seen for 1:1 OT session with focus on family education, functional transfers, midline orientation, problem solving, and sustained attention. Pt received supine in bed asleep. Engaged in therapeutic conversation with wife in regards to discharge planning. Discussed CLOF and goals with emphasis on 24/7 care. Wife unable to verbalize amount of physical assist that family/friends would be able to provided. Encouraged her to stay for therapy session to gain a better understanding of care the patient will need upon return to home. Wife present throughout, however remained quiet throughout. Discussed pt will likely need 2 caregivers at home and wife reporting she should be able to have this assist. Completed supine>sit with mod assist. Pt sat EOB unsupported with min-SBA and mod cues. Transferred bed<>w/c with max-+2 assist and max cues for anterior weight shift. Pt with difficulty maintaining anterior weight shift during transitional movements. Pt responded slightly better to cues of "keep head on my shoulder." Engaged in partial stands from bed with emphasis on sustaining anterior weight shift to clear buttocks in  preparation for transfers. Pt requesting to transfer to [facility]. Pt required +2 assist for squat pivot transfers and toilet task. Returned to bed and completed LB dressing. Completed sit<>stand from bed with +2 assist and engaged in swaying and sliding of RLE to facilitate weight bearing through LLE. Engaged in card game of [employer] from w/c level with pt demonstrating sustained attention up to 45 seconds and improved problem solving at much quicker pace. Pt with 100% accuracy when adding up cards during game. Pt left sitting in day room with wife.   Therapy Documentation Precautions:  Precautions Precautions: Fall Precaution Comments: peg Required Braces or Orthoses: Cervical Brace (cervical brace d/c'd) Cervical Brace: Hard collar (cervical brace d/c'd) Other Brace/Splint: R wrist splint and L cock up splint Restrictions Weight Bearing Restrictions: Yes RUE Weight Bearing: Weight bearing as tolerated LUE Weight Bearing: Weight bearing as tolerated RLE Weight Bearing: Weight bearing as tolerated LLE Weight Bearing: Non weight bearing Other Position/Activity Restrictions: WBAT on Rt UE per ORTHO note General:   Vital Signs: Therapy Vitals Temp: 98.6 F (37 C) Temp src: Oral Pulse Rate: 79 Resp: 20 BP: 119/63 mmHg Patient Position (if appropriate): Lying Oxygen Therapy SpO2: 98 % O2 Device: None (Room air) Pain:  No report of pain during therapy session.   See FIM for current functional status  Therapy/Group: Individual Therapy  Duayne Cal 09/23/2013, 7:41 AM

## 2013-09-23 NOTE — Progress Notes (Signed)
Physical Therapy Session Note  Patient Details  Name: Austin [name] MRN: [id number] Date of Birth: Mar 28, 1932  Today's Date: 09/23/2013 PT Individual Time: 1630-1700 PT Individual Time Calculation (min): 30 min   Skilled Therapeutic Interventions/Progress Updates:  1:1. Pt received sitting in w/c, ready for therapy with wife in room. Focus this session on bed mobility, functional transfers, toileting and family training. Pt reporting need to void, pt's wife instructed on assisting pt with urinal at w/c level. Pt found to be incontinent, so transferred back to bed req Ax2 persons for squat pivot t/f w/c>bed and sit>sup. Pt total A for clean up and clothing change, mod A for rolling R but supervision for rolling L. Pt req mod A for t/f sup>sit EOB w/ use of bed rails and max A x1person for squat pivot t/f back to w/c. Pt req consistent cues for increased trunk flexion in preparation for transfers as well as waiting for therapist to initiate t/f due to extensor tone and impulsivity. Wife providing +2 assist throughout session w/ education regarding techniques and safety. Pt and wife would continue to benefit from extensive family training. Pt left sitting in w/c w/ all needs in reach, quick release belt in place, L UE supported by lap tray and wife in room.   Therapy Documentation Precautions:  Precautions Precautions: Fall Precaution Comments: peg Required Braces or Orthoses: Cervical Brace (cervical brace d/c'd) Cervical Brace: Hard collar (cervical brace d/c'd) Other Brace/Splint: R wrist splint and L cock up splint Restrictions Weight Bearing Restrictions: Yes RUE Weight Bearing: Weight bearing as tolerated LUE Weight Bearing: Weight bearing as tolerated RLE Weight Bearing: Weight bearing as tolerated LLE Weight Bearing: Non weight bearing Other Position/Activity Restrictions: WBAT on Rt UE per ORTHO note Pain: Pain Assessment Pain Assessment: No/denies pain  See FIM for current  functional status  Therapy/Group: Individual Therapy  [name] 09/23/2013, 5:11 PM

## 2013-09-23 NOTE — Progress Notes (Signed)
Recreational Therapy Session Note  Patient Details  Name: SHEM KLOUDA MRN: [id number] Date of Birth: [birth year] Today's Date: 09/23/2013  Pain: no c/o Skilled Therapeutic Interventions/Progress Updates: Pt sat w/c level to play [employer] card game with focus on selective attention, simple math/problem solving with 100% accuracy with supervision.  Pt's wife present & observing session. Therapy/Group: Individual Therapy   Cason Dabney 09/23/2013, 3:49 PM

## 2013-09-23 NOTE — Progress Notes (Signed)
Occupational Therapy Session Note  Patient Details  Name: Austin [name] MRN: [id number] Date of Birth: 1932/08/22  Today's Date: 09/23/2013 OT Individual Time: 1000-1100 OT Individual Time Calculation (min): 60 min    Short Term Goals: Week 3:  OT Short Term Goal 1 (Week 3): Patient will complete toilet or shower transfer to rolling showre chair with max assist +1 OT Short Term Goal 2 (Week 3): Patient will sit unsupported at supervision level for 45 seconds during functional task  OT Short Term Goal 3 (Week 3): Patient will demonstrate selective attention for 15 seconds with min cues in busy environment OT Short Term Goal 4 (Week 3): Patient will stand at sink with max assist to complete 1 LB self-care task for 10 seconds  Skilled Therapeutic Interventions/Progress Updates:    Pt engaged in BADL retraining including bathing and dressing with sit<>stand from w/c at sink.  Focus on maintaining static sitting balance at midline (with use of mirror for feedback), task initiation, attention to task, sequencing, sit<>stand, standing balance, and activity tolerance. Pt's LLE exhibits increased hip/knee flexor tone when standing and requires tot A to maintain LLE planted on floor.  Pt requires tot A + 2 for sit<>stand and standing balance to complete bathing buttocks and pulling up pants.  Pt required min verbal cues to redirect to bathing tasks.    Therapy Documentation Precautions:  Precautions Precautions: Fall Precaution Comments: peg Required Braces or Orthoses: Cervical Brace (cervical brace d/c'd) Cervical Brace: Hard collar (cervical brace d/c'd) Other Brace/Splint: R wrist splint and L cock up splint Restrictions Weight Bearing Restrictions: Yes RUE Weight Bearing: Weight bearing as tolerated LUE Weight Bearing: Weight bearing as tolerated RLE Weight Bearing: Weight bearing as tolerated LLE Weight Bearing: Non weight bearing Other Position/Activity Restrictions: WBAT on Rt UE  per ORTHO note   Pain: Pain Assessment Pain Assessment: No/denies pain Pain Score: 0-No pain See FIM for current functional status  Therapy/Group: Individual Therapy  Leroy Libman 09/23/2013, 2:27 PM

## 2013-09-23 NOTE — Progress Notes (Signed)
Speech Language Pathology Daily Session Note  Patient Details  Name: Austin [name] MRN: [id number] Date of Birth: 07/09/1932  Today's Date: 09/23/2013 SLP Individual Time: [id number] SLP Individual Time Calculation (min): 30 min  Short Term Goals: Week 3: SLP Short Term Goal 1 (Week 3): Pt will utilize swallowing compensatory strategies with trials of puree textures and honey thick liquids to minimize overt s/s of aspiration with Mod A multimodal cues. SLP Short Term Goal 2 (Week 3): Pt will utilize an increased vocal intensity at the word level with Min A multimodal cues.  SLP Short Term Goal 3 (Week 3): Pt will utilize external memory aids to recall new, daily information with supervision multimodal cues.  SLP Short Term Goal 4 (Week 3): Pt will identify 2 cognitive and 2 physical deficit with Min A multimodal cues.  SLP Short Term Goal 5 (Week 3): Pt will initiate functional tasks with Min A multimodal cues.  SLP Short Term Goal 6 (Week 3): Pt will demonstrate sustained attention to functional tasks for 20 minutes with Mod multimodal cues.   Skilled Therapeutic Interventions:  Pt was seen for skilled speech therapy targeting dysphagia goals. Pt was upright in wheelchair in dayroom upon arrival with wife present.  Pt awake, alert, and agreeable to participate in [place].  Pt recalled recommended swallowing precautions with min assist question cues.  SLP facilitated the session with skilled observations completed during PO trials of magic cup consistencies via teaspoon with pt exhibiting immediate cough x1, likely due to suspected delayed swallow initiation.  Pt benefited from overall min cues to utilize extra swallows and throat clear to minimize overt s/s of aspiration.  Continue per current plan of care.    FIM:  Comprehension Comprehension Mode: Auditory Comprehension: 5-Understands basic 90% of the time/requires cueing < 10% of the time Expression Expression Mode: Verbal Expression:  4-Expresses basic 75 - 89% of the time/requires cueing 10 - 24% of the time. Needs helper to occlude trach/needs to repeat words. Social Interaction Social Interaction: 4-Interacts appropriately 75 - 89% of the time - Needs redirection for appropriate language or to initiate interaction. Problem Solving Problem Solving: 2-Solves basic 25 - 49% of the time - needs direction more than half the time to initiate, plan or complete simple activities Memory Memory: 4-Recognizes or recalls 75 - 89% of the time/requires cueing 10 - 24% of the time FIM - Eating Eating Activity: 5: Needs verbal cues/supervision  Pain Pain Assessment Pain Assessment: No/denies pain  Therapy/Group: Individual Therapy  Ebonee Stober, Selinda Orion 09/23/2013, 4:41 PM
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
## 2013-09-23 NOTE — Progress Notes (Signed)
Physical Therapy Session Note  Patient Details  Name: Austin [name] MRN: [id number] Date of Birth: 23-Oct-1932  Today's Date: 09/23/2013 PT Individual Time: 0800-0900 and [id number] PT Individual Time Calculation (min): 60 min and 24 min  Short Term Goals: Week 3:  PT Short Term Goal 1 (Week 3): Patient will perform bed mobility with maxA x1. PT Short Term Goal 2 (Week 3): Patient will perform unsupported static sitting balance with supervision/verbal cues. PT Short Term Goal 3 (Week 3): Patient will perform gait training 65' with +2 assist.  Skilled Therapeutic Interventions/Progress Updates:    First session: Patient received supine in bed. Session focused on sustained attention with functional transfers, wheelchair mobility, B LE NMR, and remediation of pusher tendencies. Supine>sit with use of bedrails and maxA x1, patient assisted with threading LEs through pants sitting EOB, cues for anterior weight shift and achieving/maintaining midline with activity, squat pivot transfer with maxA x1. Wheelchair mobility x100' with modA for steering, max cues for task initiation and sustained attention to task.  B LE NMR: Prolonged half standing with R knee on mat with +2 assist to facilitate increased weight bearing through L LE and decreased extensor tone of R LE with hip and knee maintained in flexion. Each trial, patient able to position R knee on mat independently. Pre-gait activities: standing with +2 assist, forward/retro stepping with R LE to increase weight bearing through L LE and improve advancement of R LE. Patient continues to require max multimodal cues during all activities for upright posture and midline orientation. Patient left sitting in wheelchair with seatbelt donned and all needs within reach.  Second session: Patient received sitting in wheelchair, wife present. Discussion about discharge planning, 24/7 assist and supervision upon discharge, DME needs, family training sessions,  etc. Patient's wife observed squat pivot transfer wheelchair>bed and sit>supine with maxA x1. Discussion/education about pusher syndrome and it's effects on mobility with patient's wife. Also discussed team conference today and that likely d/c date would be set today. Patient falling asleep in bed, left resting in bed until OT's arrival.  Therapy Documentation Precautions:  Precautions Precautions: Fall Precaution Comments: peg Other Brace/Splint: R wrist splint and L cock up splint Restrictions Weight Bearing Restrictions: Yes RUE Weight Bearing: Weight bearing as tolerated Other Position/Activity Restrictions: WBAT on Rt UE per ORTHO note General: PT Amount of Missed Time (min): 6 Minutes PT Missed Treatment Reason: Patient fatigue Pain: Pain Assessment Pain Assessment: No/denies pain Pain Score: 0-No pain Locomotion : Ambulation Ambulation/Gait Assistance: Not tested (comment)   See FIM for current functional status  Therapy/Group: Individual Therapy  Austin [name]. Austin [name], PT, DPT 09/23/2013, 1:27 PM

## 2013-09-24 ENCOUNTER — Encounter (HOSPITAL_COMMUNITY): Payer: Medicare Other

## 2013-09-24 ENCOUNTER — Inpatient Hospital Stay (HOSPITAL_COMMUNITY): Payer: Medicare Other

## 2013-09-24 ENCOUNTER — Inpatient Hospital Stay (HOSPITAL_COMMUNITY): Payer: Medicare Other | Admitting: Physical Therapy

## 2013-09-24 ENCOUNTER — Inpatient Hospital Stay (HOSPITAL_COMMUNITY): Payer: Medicare Other | Admitting: Speech Pathology

## 2013-09-24 DIAGNOSIS — Z5189 Encounter for other specified aftercare: Secondary | ICD-10-CM

## 2013-09-24 DIAGNOSIS — S069X9A Unspecified intracranial injury with loss of consciousness of unspecified duration, initial encounter: Secondary | ICD-10-CM

## 2013-09-24 DIAGNOSIS — S069XAA Unspecified intracranial injury with loss of consciousness status unknown, initial encounter: Secondary | ICD-10-CM

## 2013-09-24 LAB — GLUCOSE, CAPILLARY
Glucose-Capillary: 108 mg/dL — ABNORMAL HIGH (ref 70–99)
Glucose-Capillary: 156 mg/dL — ABNORMAL HIGH (ref 70–99)
Glucose-Capillary: 78 mg/dL (ref 70–99)
Glucose-Capillary: 168 mg/dL — ABNORMAL HIGH (ref 70–99)

## 2013-09-24 MED ORDER — BACLOFEN 5 MG HALF TABLET
5.0000 mg | ORAL_TABLET | Freq: Two times a day (BID) | ORAL | Status: DC
  Administered 2013-09-24 – 2013-10-03 (×19): 5 mg via ORAL
  Filled 2013-09-24 (×22): qty 1
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
## 2013-09-24 NOTE — Progress Notes (Signed)
Occupational Therapy Session Note  Patient Details  Name: Austin [name] MRN: [id number] Date of Birth: 08/13/1932  Today's Date: 09/24/2013 OT Individual Time: [phone] and 1300-1400 and 1435-1455 OT Individual Time Calculation (min): 60 min and 60 min and 20 min     Short Term Goals: Week 3:  OT Short Term Goal 1 (Week 3): Patient will complete toilet or shower transfer to rolling showre chair with max assist +1 OT Short Term Goal 2 (Week 3): Patient will sit unsupported at supervision level for 45 seconds during functional task  OT Short Term Goal 3 (Week 3): Patient will demonstrate selective attention for 15 seconds with min cues in busy environment OT Short Term Goal 4 (Week 3): Patient will stand at sink with max assist to complete 1 LB self-care task for 10 seconds  Skilled Therapeutic Interventions/Progress Updates:    Session 1: Pt seen for ADL retraining with focus on static and dynamic sitting balance, postural control, weight shifting, attention, and initiation. Pt received supine in bed. Completed bathing and dressing sitting EOB with basin placed to right to facilitate weight shift. Pt with static sitting balance up to 1 min at SBA level. Pt required min-max assist for dynamic sitting balance with mod cues for postural control. Pt demonstrated increased awareness of postural control and initiation to correct when having LOB laterally. Completed sit<>stand from bed 3x with max assist and emphasis on maintaining anterior weight shift during transition sit>stand with max cues. Pt fatiguing by end of session, closing eyes at times. Pt reported not sleeping well and requested to return to bed. Required total assist for sit>supine. Pt left supine in bed with LUE elevated. Pt snoring as therapist leaving room.   Session 2: Pt seen for 1:1 OT session with focus on contracture management, postural control, sitting balance, and functional transfers. Pt received sitting in w/c with wife  leaving room upon entry. Encouraged wife to stay for therapy session when adjusting splint however reported she was leaving. Discussed attending 1430 session for family training and informed therapist she would be back. Adapted resting hand splint to increase wrist extension for contracture management and decrease edema. Adjusted straps of splint to allow for optimal positioning at wrist and prevent hand from sliding out of splint. Transferred w/c>mat table (to right) with +2 assist d/t pusher tendencies. Engaged in reaching task with emphasis on trunk strengthening, anterior weight shift, and lateral weight shift to right. Provided mirror for visual feedback of postural control. Pt required mod-SBA for postural control and mod cues. Pt transferred mat>w/c (to left) with max assist. Returned to room and transferred w/c>bed with total assist +1. Pt left supine in bed with all needs in reach.   Session 3: Pt seen for 1:1 OT session with focus on bed mobility and activity tolerance. Pt received supine in bed with wife not present. Pt incontinent of urine. Completed bridging in bed with assist to manage LLE to remove pants. Completed rolling L and R with mod assist to complete hygiene and donning new brief. Pt very fatigued this afternoon, closing eyes multiple times during session. Pt left positioned in bed asleep with LUE elevated on pillows. Removed resting hand splint and skin integrity good. Pt reporting no discomfort.   Therapy Documentation Precautions:  Precautions Precautions: Fall Precaution Comments: peg Required Braces or Orthoses: Cervical Brace (cervical brace d/c'd) Cervical Brace: Hard collar (cervical brace d/c'd) Other Brace/Splint: R wrist splint and L cock up splint Restrictions Weight Bearing Restrictions: Yes RUE Weight  Bearing: Weight bearing as tolerated LUE Weight Bearing: Weight bearing as tolerated RLE Weight Bearing: Weight bearing as tolerated LLE Weight Bearing: Non weight  bearing Other Position/Activity Restrictions: WBAT on Rt UE per ORTHO note General:   Vital Signs:   Pain: Pain Assessment Pain Assessment: No/denies pain Pain Score: 0-No pain  See FIM for current functional status  Therapy/Group: Individual Therapy  Duayne Cal 09/24/2013, 10:46 AM

## 2013-09-24 NOTE — Progress Notes (Signed)
Physical Therapy Session Note  Patient Details  Name: [place] [name] MRN: [id number] Date of Birth: 09-14-1932  Today's Date: 09/24/2013 PT Individual Time: 1105-1205 PT Individual Time Calculation (min): 60 min   Short Term Goals: Week 3:  PT Short Term Goal 1 (Week 3): Patient will perform bed mobility with maxA x1. PT Short Term Goal 2 (Week 3): Patient will perform unsupported static sitting balance with supervision/verbal cues. PT Short Term Goal 3 (Week 3): Patient will perform gait training 60' with +2 assist.  Skilled Therapeutic Interventions/Progress Updates:    W/C Management: PT instructs pt in locking/unlocking brakes with R UE req verbal cues for R side and min A at trunk to lean forward and left so that pt can reach L brake with R hand.  PT instructs pt in w/c propulsion with R UE/LE req SBA-occasional min A: L side attention activity includes reading signs on the wall on the L side and asking pt if he was too close to the L wall and, if so, instructing pt to move towards the middle of the hallway. PT progressed this exercise by having pt also scan R and then decide if he was in the middle of the hallway or not, which pt was able to decide appropriately each time.   Neuromuscular Reeducation: PT instructs pt in seated dynamic balance activity to reduce pushing: forward and to the Right trunk lean, with PT instructing pt to reach for PT Tech's hand placed far away in order to have pt translate trunk past midline to the R x 10 reps req CGA and verbal cues for safety and technique.  PT instructs pt in sit to stand from w/c req mod-max A for balance, strong push to the L with LE (no AD) and tall mirror for visual feedback. PT provides max A support in stand on pt's L side of the body (direction he pushes) and instructs pt to touch his R shoulder and R hip to the PT Tech's L shoulder and L hip in stand x 10 reps, req min A and verbal cues to "stand tall" as knees begin to bend from  fatigue.  PT instructs pt in R toe taps on low stair with R rail req mod-max A for balance and L knee blocked x 3 reps total; once pt has R foot on stair, PT instructs him in leaning R shoulder and R hip towards PT Tech's L shoulder and L hip.  PT instructs pt in mirror box therapy to the L UE and L LE while sitting up in w/c. Pt req repeated verbal cues to "look at your left arm/leg" (referring to the mirror reflection). Reps were 20-50 and exercises included R grip/release, wrist extension, finger taps on the mat, forearm pronation/supination, elbow flexion, R LAQ (partial), toe taps, march.  PT notes that with increased duration of exercise, pt demonstrates increasingly prolonged visual fixation on L mirror reflexion/decreased inattention to the L. Due to pt's cognitive difficulties following complex commands, PT instructed pt in unilateral UE/LE movement during mirror therapy. Also, pt responded well to exercises in overcoming his tendency to push to the L with R side of body in sit and in stand. 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 Continue with sitting/standing balance, midline orientation, sit to stand and transfer training, gait training when 3 persons are available (3rd person for close w/c follow), and w/c management.   Therapy Documentation  Precautions:  Precautions Precautions: Fall Precaution Comments: peg Required Braces or Orthoses: Cervical Brace (cervical brace d/c'd) Cervical Brace: Hard collar (cervical brace d/c'd) Other Brace/Splint: R wrist splint and L cock up splint Restrictions Weight Bearing Restrictions: Yes RUE Weight Bearing: Weight bearing as tolerated LUE Weight Bearing: Weight bearing as tolerated RLE Weight Bearing: Weight bearing as tolerated LLE Weight Bearing: Non weight bearing Other Position/Activity Restrictions: WBAT on Rt UE per ORTHO note Pain: Pain Assessment Pain  Assessment: No/denies pain Pain Score: 0-No pain  See FIM for current functional status  Therapy/Group: Individual Therapy with Denman George, PT Tech  [facility] M 09/24/2013, 11:07 AM

## 2013-09-24 NOTE — Progress Notes (Signed)
Speech Language Pathology Daily Session Note  Patient Details  Name: [place] [name] MRN: [id number] Date of Birth: 06/22/1932  Today's Date: 09/24/2013 SLP Individual Time: 1005-1105 SLP Individual Time Calculation (min): 60 min  Short Term Goals: Week 3: SLP Short Term Goal 1 (Week 3): Pt will utilize swallowing compensatory strategies with trials of puree textures and honey thick liquids to minimize overt s/s of aspiration with Mod A multimodal cues. SLP Short Term Goal 2 (Week 3): Pt will utilize an increased vocal intensity at the word level with Min A multimodal cues.  SLP Short Term Goal 3 (Week 3): Pt will utilize external memory aids to recall new, daily information with supervision multimodal cues.  SLP Short Term Goal 4 (Week 3): Pt will identify 2 cognitive and 2 physical deficit with Min A multimodal cues.  SLP Short Term Goal 5 (Week 3): Pt will initiate functional tasks with Min A multimodal cues.  SLP Short Term Goal 6 (Week 3): Pt will demonstrate sustained attention to functional tasks for 20 minutes with Mod multimodal cues.   Skilled Therapeutic Interventions: Skilled treatment session focused on dysphagia and cognitive-linguistic goals. Student facilitated session by reviewing written external aid to promote recall and carryover of safe swallow strategies, which patient was able to Independently verbalize, but required Min assist multimodal cues for utilization.  Patient consumed 4oz of puree and 4oz of honey-thick liquids with delayed throat clear x1 due to distraction from completing safe swallow strategies.  Patient attended to left anterior loss with Supervision cues but required Min-Mod cues to initiate bites throughout session.  Patient reported not feeling hungry. Student questions whether or not patient has the desire to eat given lack of hunger. Discussed modifying tube feed schedule with RN. Recommend trial lunch tray of Dys. 1 textures with Honey Thick liquids with  SLP tomorrow.    FIM:  Comprehension Comprehension Mode: Auditory Comprehension: 5-Understands basic 90% of the time/requires cueing < 10% of the time Expression Expression Mode: Verbal Expression: 4-Expresses basic 75 - 89% of the time/requires cueing 10 - 24% of the time. Needs helper to occlude trach/needs to repeat words. Social Interaction Social Interaction: 4-Interacts appropriately 75 - 89% of the time - Needs redirection for appropriate language or to initiate interaction. Problem Solving Problem Solving: 3-Solves basic 50 - 74% of the time/requires cueing 25 - 49% of the time Memory Memory: 4-Recognizes or recalls 75 - 89% of the time/requires cueing 10 - 24% of the time FIM - Eating Eating Activity: 5: Needs verbal cues/supervision  Pain Pain Assessment Pain Assessment: No/denies pain Pain Score: 0-No pain  Therapy/Group: Individual Therapy  Rayel Santizo 09/24/2013, 11:25 AM

## 2013-09-24 NOTE — Progress Notes (Signed)
The skilled treatment note has been reviewed and SLP is in agreement. Chasya Zenz, M.A., CCC-SLP [phone]  
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
## 2013-09-25 ENCOUNTER — Inpatient Hospital Stay (HOSPITAL_COMMUNITY): Payer: Medicare Other | Admitting: *Deleted

## 2013-09-25 ENCOUNTER — Inpatient Hospital Stay (HOSPITAL_COMMUNITY): Payer: Medicare Other | Admitting: Speech Pathology

## 2013-09-25 ENCOUNTER — Encounter (HOSPITAL_COMMUNITY): Payer: Medicare Other

## 2013-09-25 LAB — GLUCOSE, CAPILLARY
Glucose-Capillary: 97 mg/dL (ref 70–99)
Glucose-Capillary: 110 mg/dL — ABNORMAL HIGH (ref 70–99)
Glucose-Capillary: 76 mg/dL (ref 70–99)
Glucose-Capillary: 127 mg/dL — ABNORMAL HIGH (ref 70–99)

## 2013-09-25 NOTE — Progress Notes (Signed)
Social Work Patient ID: [name], male   DOB: 05-25-1932, 80 y.o.   MRN: [id number]  Met with pt's wife yesterday afternoon to review team conference info.  Have informed her of targeted d/c date 9/10 and of need to begin caregiver training ASAP.  Had discussed with her last week (and truly since admission)  the need to confirm the help she will have post d/c.  She now reports that, while she did speak with her "nurse friends" about pt's care, she has not received a confirmation of how much support they can provide.  Have explained that pt's care will be extensive and labor intensive.  Again, explained that she does have option to pursue SNF.  Wife remains "hopeful" that she can "avoid nursing home" for pt.  I made agreement with her yesterday that she would try and confirm plans with me today.    Wife in today and reports that she has a friend,  Sherle Poe who is in [place] here at [facility] and that he plans to speak with me today to clarify what pt's care will entail so that he can better explain to the other friends and help her to determine if home care is possible.  I have not yet seen nor heard from Mr. Flack.  I have heard from pt's granddaughter who also called me today to clarify anticipated care needs.  At this point, we still have no confirmed plan for d/c.  Will continue to push wife to clarify and will prep paperwork for SNF as this may very well be the ultimate plan.   Salem Lembke, LCSW
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
## 2013-09-25 NOTE — Plan of Care (Signed)
Problem: RH Bathing Goal: LTG Patient will bathe with assist, cues/equipment (OT) LTG: Patient will bathe specified number of body parts with assist with/without cues using equipment (position) (OT)  Downgraded 9/3 due to slow progress, decreased postural control, and impaired cognition.  Problem: RH Dressing Goal: LTG Patient will perform upper body dressing (OT) LTG Patient will perform upper body dressing with assist, with/without cues (OT).  Downgraded 9/3 due to slow progress, decreased postural control, and impaired cognition. Goal: LTG Patient will perform lower body dressing w/assist (OT) LTG: Patient will perform lower body dressing with assist, with/without cues in positioning using equipment (OT)  Downgraded 9/3 due to slow progress, decreased postural control, and impaired cognition.  Problem: RH Toileting Goal: LTG Patient will perform toileting w/assist, cues/equip (OT) LTG: Patient will perform toiletiing (clothes management/hygiene) with assist, with/without cues using equipment (OT)  Downgraded 9/3 due to slow progress, decreased postural control, and impaired cognition.  Problem: RH Toilet Transfers Goal: LTG Patient will perform toilet transfers w/assist (OT) LTG: Patient will perform toilet transfers with assist, with/without cues using equipment (OT)  Downgraded 9/3 due to slow progress, decreased postural control, and impaired cognition.  Problem: RH Tub/Shower Transfers Goal: LTG Patient will perform tub/shower transfers w/assist (OT) LTG: Patient will perform tub/shower transfers with assist, with/without cues using equipment (OT)  Downgraded 9/3 due to slow progress, decreased postural control, and impaired cognition.

## 2013-09-25 NOTE — Progress Notes (Signed)
Physical Therapy Weekly Progress Note  Patient Details  Name: Austin [name] MRN: [id number] Date of Birth: July 29, 1932  Beginning of progress report period: September 18, 2013 End of progress report period: September 25, 2013  Today's Date: 09/25/2013 PT Individual Time: 1100-1200, 1400-1500, and 1630-1700 PT Individual Time Calculation (min): 60 min, 60 min, and 30 min  Patient has met 2 of 3 short term goals. Patient has made good progress during this week on rehab, although progress not clearly demonstrated functionally. Patient is able to consistently perform bed mobility and functional transfers with maxA x1. Patient has progressed to perform unsupported sitting balance with SBA and max verbal and tactile cues, however can require up to totalA to achieve upright sitting. Patient's greatest barriers at this point are his pusher tendencies/extensor tone and significant perceptual impairments, which impact his postural control and overall functional mobility. Patient is currently demonstrating behaviors consistent with Rancho Level VI.  Patient continues to demonstrate the following deficits:and therefore will continue to benefit from skilled PT intervention to enhance overall performance with activity tolerance, balance, postural control, ability to compensate for deficits, functional use of  right upper extremity, right lower extremity, left upper extremity and left lower extremity, attention, awareness, coordination and knowledge of precautions.  Patient not progressing toward long term goals.  See goal revision..  Plan of care revisions: all LTGs downgraded to modA overall.  PT Short Term Goals Week 1:  PT Short Term Goal 1 (Week 1): Patient will perform bed mobility with maxA x1. PT Short Term Goal 1 - Progress (Week 1): Progressing toward goal PT Short Term Goal 2 (Week 1): Patient will perform functional transfers with maxA x1. PT Short Term Goal 2 - Progress (Week 1): Progressing  toward goal PT Short Term Goal 3 (Week 1): Patient will demonstrate sustained attention x10" in controlled environment with max cues. PT Short Term Goal 3 - Progress (Week 1): Progressing toward goal Week 2:  PT Short Term Goal 1 (Week 2): Patient will perform bed mobility with maxA x1. PT Short Term Goal 1 - Progress (Week 2): Progressing toward goal (meets intermittently and with use of hospital bed functions) PT Short Term Goal 2 (Week 2): Patient will perform functional transfers with maxA x1. PT Short Term Goal 2 - Progress (Week 2): Met PT Short Term Goal 3 (Week 2): Patient will demonstrate sustained attention x10" in controlled environment with max cues. PT Short Term Goal 3 - Progress (Week 2): Met Week 3:  PT Short Term Goal 1 (Week 3): Patient will perform bed mobility with maxA x1. PT Short Term Goal 1 - Progress (Week 3): Met PT Short Term Goal 2 (Week 3): Patient will perform unsupported static sitting balance with supervision/verbal cues. PT Short Term Goal 2 - Progress (Week 3): Met PT Short Term Goal 3 (Week 3): Patient will perform gait training 7' with +2 assist. PT Short Term Goal 3 - Progress (Week 3): Progressing toward goal Week 4:  PT Short Term Goal 1 (Week 4): Patient will perform bed mobility with modA x1. PT Short Term Goal 2 (Week 4): Patient will perform functional transfers with modA x1. PT Short Term Goal 3 (Week 4): Patient will perform wheelchair mobility 14' x1 with minA.  Skilled Therapeutic Interventions/Progress Updates:    AM Session: Patient received sitting in wheelchair. Session focused on wheelchair mobility and family education/training for bed<>wheelchair transfers only with patient's wife. Wheelchair mobility 125' x1 with minA-supervision, mod cues for sustained attention to task.  Jay-2 cushion placed in standard wheelchair for pressure relief secondary to patient's inability to perform pressure relief independently due to physical and cognitive  impairments.  Family education/training session for bed<>wheelchair squat pivot transfers with patient's wife. Demonstration wheelchair<>bed performed first for patient's wife to observe. Extensive discussion/education about set up of tone/pusher tendencies, transfers, safety, technique, sequencing, body mechanics, and cues for wheelchair<>bed squat pivot transfers via Bobath technique. Patient's wife performed x4 bed<>wheelchair transfers with modA of therapist for safety. Patient's wife requires increased assistance (maxA) when transferring patient to the R. Additionally, max cues for patient's wife for her body mechanics (knee bending, back straight, etc.) Patient and wife will benefit from continued practice and training sessions. Patient left sitting in standard wheelchair with seatbelt donned and all needs within reach, wife present.  First PM Session: Patient received sitting in wheelchair, wife present. Extensive discussion about home accessibility (door widths, steps to enter home, building a ramp, use of hospital bed, etc.). Provided patient's wife with "How to Build a Ramp" handout to ensure ramp is built to code. Wife states she knows someone who can do it. Remainder of session focused on family education/training session for wheelchair>bed transfer and sit<>supine. Patient's wife performed wheelchair>bed transfer with modA from therapist. Demonstration sit<>supine performed first for patient's wife to observe. Extensive discussion/education about set up with use of hospital bed functions, tone/pusher tendencies, safety, technique, sequencing, body mechanics, and cues for sit<>supine; patient's wife practiced sit<>supine once each direction with +2 assist (wife facilitated at B LEs and therapist at upper body). Max cues for wife's body mechanics as she bends and lifts with the back instead of the knees.   LLE PNF D1 patterns, patient with much improved AROM/strength in L LE! Patient left  semi-reclined in bed with all needs within reach and bed alarm on, wife present.  Second PM Session: Patient received supine in bed, wife assisting with clean up after incontinent episode. Assisted patient's wife with rolling to B sides to doff pants, don new brief, and don new pants and socks. Patient performed supine>sit with HOB elevated and bed rails with maxA, squat pivot transfer bed>wheelchair with maxA x1. Patient left sitting in wheelchair with L half lap tray, seatbelt donned, and all needs within reach; wife present.  Therapy Documentation Precautions:  Precautions Precautions: Fall Precaution Comments: peg Required Braces or Orthoses: Cervical Brace (cervical brace d/c'd) Cervical Brace: Hard collar (cervical brace d/c'd) Other Brace/Splint: R wrist splint and L cock up splint Restrictions Weight Bearing Restrictions: Yes RUE Weight Bearing: Weight bearing as tolerated Other Position/Activity Restrictions: WBAT on Rt UE per ORTHO note Pain: Pain Assessment Pain Assessment: No/denies pain Pain Score: 0-No pain Locomotion : Ambulation Ambulation/Gait Assistance: Not tested (comment) Wheelchair Mobility Distance: 125   See FIM for current functional status  Therapy/Group: Individual Therapy  Lillia Abed. Sevilla Murtagh, PT, DPT 09/25/2013, 12:14 PM

## 2013-09-25 NOTE — Progress Notes (Signed)
Occupational Therapy Weekly Progress Note  Patient Details  Name: Austin [name] MRN: [id number] Date of Birth: Oct 23, 1932  Beginning of progress report period: September 18, 2013 End of progress report period: September 25, 2013  Today's Date: 09/25/2013 OT Individual Time: 0900-1000 OT Individual Time Calculation (min): 60 min    Patient has met 2 of 4 short term goals. Patient has been making progress in therapy this week, however no major functional gains. Patient is performing bed<>w/c transfers at max assist, and toilet transfer to left with max assist. Patient requires total assist +2 for toilet transfer to L d/t pusher tendencies. Patient demonstrates improved static and dynamic sitting balance to min-SBA, and at times requires total assist with overt LOB. Patient is demonstrating improved awareness when sitting EOB without visual cue, as he is increasing initiation to correct posture. Patient's greatest barriers at this point are his pusher tendencies and perceptual impairments, which impact his postural control and overall functional transfers during self-care tasks. Patient is currently demonstrating behaviors consistent with Rancho Level VI.   Patient continues to demonstrate the following deficits: L hemiplegia, decreased balance, decreased postural control, decreased coordination, decreased balance strategies, decreased strength, decreased attention, decreased awareness, impaired overall cognition, impaired perceptual skills, and decreased activity tolerance and therefore will continue to benefit from skilled OT intervention to enhance overall performance with BADLs, postural control, balance, activity tolerance, and functional use of LUE.    Patient not progressing toward long term goals.  See goal revision..  Plan of care revisions: Goals downgraded to max-total assist +2 overall.  OT Short Term Goals Week 3:  OT Short Term Goal 1 (Week 3): Patient will complete toilet or shower  transfer to rolling showre chair with max assist +1 OT Short Term Goal 1 - Progress (Week 3): Partly met OT Short Term Goal 2 (Week 3): Patient will sit unsupported at supervision level for 45 seconds during functional task  OT Short Term Goal 2 - Progress (Week 3): Met OT Short Term Goal 3 (Week 3): Patient will demonstrate selective attention for 15 seconds with min cues in busy environment OT Short Term Goal 3 - Progress (Week 3): Progressing toward goal OT Short Term Goal 4 (Week 3): Patient will stand at sink with max assist to complete 1 LB self-care task for 10 seconds OT Short Term Goal 4 - Progress (Week 3): Met Week 4:  OT Short Term Goal 1 (Week 4): Focus on LTGs and family education/training  Skilled Therapeutic Interventions/Progress Updates:    Pt seen for ADL retraining with focus on functional transfers, postural control in sitting and standing, attention, and initiation. Pt received supine in bed. Transferred bed>rolling shower chair with max assist. Engaged in bathing at shower level with min cues for initiation and mod cues for sequencing d/t increased difficulty with applying soap to wash cloth prior to bathing. Required +2 assist to transfer shower chair>w/c. Completed dressing with +2 assist sit<>stand and emphasis on postural control and midline orientation. Improved midline orientation noted as pt not holding onto anything. Complete oral care from w/c level with min cues. Pt left sitting in w/c with all needs in reach.  Therapy Documentation Precautions:  Precautions Precautions: Fall Precaution Comments: peg Required Braces or Orthoses: Cervical Brace (cervical brace d/c'd) Cervical Brace: Hard collar (cervical brace d/c'd) Other Brace/Splint: R wrist splint and L cock up splint Restrictions Weight Bearing Restrictions: Yes RUE Weight Bearing: Weight bearing as tolerated LUE Weight Bearing: Weight bearing as tolerated RLE Weight Bearing:  Weight bearing as  tolerated LLE Weight Bearing: Non weight bearing Other Position/Activity Restrictions: WBAT on Rt UE per ORTHO note General:   Vital Signs:   Pain: Pain Assessment Pain Assessment: No/denies pain Pain Score: 0-No pain ADL:   Exercises:   Other Treatments:    See FIM for current functional status  Therapy/Group: Individual Therapy  Duayne Cal 09/25/2013, 12:27 PM

## 2013-09-25 NOTE — Progress Notes (Signed)
The weekly progress and skilled treatment notes has been reviewed and SLP is in agreement. Gunnar Fusi, M.A., CCC-SLP [phone]

## 2013-09-25 NOTE — Progress Notes (Signed)
Speech Language Pathology Weekly Progress and Session Note  Patient Details  Name: Austin [name] MRN: [id number] Date of Birth: September 04, 1932  Beginning of progress report period: September 18, 2013 End of progress report period: September 25, 2013  Today's Date: 09/25/2013 SLP Individual Time: 1000-1100 SLP Individual Time Calculation (min): 60 min  Short Term Goals: Week 3: SLP Short Term Goal 1 (Week 3): Pt will utilize swallowing compensatory strategies with trials of puree textures and honey thick liquids to minimize overt s/s of aspiration with Mod A multimodal cues. SLP Short Term Goal 1 - Progress (Week 3): Met SLP Short Term Goal 2 (Week 3): Pt will utilize an increased vocal intensity at the word level with Min A multimodal cues.  SLP Short Term Goal 2 - Progress (Week 3): Progressing toward goal SLP Short Term Goal 3 (Week 3): Pt will utilize external memory aids to recall new, daily information with supervision multimodal cues.  SLP Short Term Goal 3 - Progress (Week 3): Met SLP Short Term Goal 4 (Week 3): Pt will identify 2 cognitive and 2 physical deficit with Min A multimodal cues.  SLP Short Term Goal 4 - Progress (Week 3): Met SLP Short Term Goal 5 (Week 3): Pt will initiate functional tasks with Min A multimodal cues.  SLP Short Term Goal 5 - Progress (Week 3): Progressing toward goal SLP Short Term Goal 6 (Week 3): Pt will demonstrate sustained attention to functional tasks for 20 minutes with Mod multimodal cues.  SLP Short Term Goal 6 - Progress (Week 3): Met    New Short Term Goals: Week 4: SLP Short Term Goal 1 (Week 4): Pt will utilize swallowing compensatory strategies with trials of puree textures and honey thick liquids to minimize overt s/s of aspiration with Min A multimodal cues. SLP Short Term Goal 2 (Week 4): Pt will utilize an increased vocal intensity at the word level with Min A multimodal cues.  SLP Short Term Goal 3 (Week 4): Pt will utilize external  memory aids to recall new, daily information with Mod I multimodal cues.  SLP Short Term Goal 4 (Week 4): Pt will identify 2 cognitive and 2 physical deficit with Supervision multimodal cues.  SLP Short Term Goal 5 (Week 4): Pt will initiate functional tasks with Min A multimodal cues.  SLP Short Term Goal 6 (Week 4): Pt will demonstrate sustained attention to functional tasks for 20 minutes with Min A multimodal cues.   Weekly Progress Updates: Patient has made functional gains and has met 4 of 6 STG's this reporting period due to increased attention, awareness, working memory, orientation and swallowing function. Currently, patient is demonstrating behaviors consistent with a Rancho Level VI and requires overall Mod A to perform functional and familiar tasks. On 09/25/13, patient was taken off NPO and upgraded to Dys. 1 diet with honey thick liquids and full supervision to minimize overt s/s of aspiration and increase utilization of swallowing compensatory strategies. Patient continues to be dysphonic; however, vocal intensity goal was not addressed due to increased focus on swallowing function. Plan to target goal next week, as patient still requires Mod A multimodal cues for increased vocal intensity at the word level. Patient/family education ongoing and patient would benefit from continued skilled SLP intervention to maximize his cognitive-linguistic and swallowing function and overall functional independence prior to discharge.   Intensity: Minumum of 1-2 x/day, 30 to 90 minutes Frequency: 5 out of 7 days Duration/Length of Stay: 3 weeks Treatment/Interventions: Cognitive remediation/compensation;Cueing hierarchy;Environmental controls;Internal/external  aids;Speech/Language facilitation;Therapeutic Activities;Patient/family education;Functional tasks;Dysphagia/aspiration precaution training   Daily Session  Skilled Therapeutic Interventions:  Skilled treatment session focused on dysphagia  and cognitive-linguistic goals. Student facilitated session by reviewing written external aid to promote recall and carryover of safe swallow strategies, which patient required Mod assist multimodal cues for utilization.  Patient consumed 40% of Dys. 1 breakfast meal and 6 oz of honey-thick liquids with delayed coughing  x3 during periods between bites and sips, student suspects coughing is due to increased saliva production. Patient attended to left anterior loss with Min A but required Mod A to initiate bites throughout session.  Patient upgraded diet to Dys. 1 textures with honey thick liquids and full supervision to ensure utilization of swallowing compensatory strategies and to minimize overt s/s of aspiration. Discussed modifying tube feed schedule PRN to meet nutritional needs with PA. Patient was handed off to PT. Continue current plan of care.         FIM:  Comprehension Comprehension Mode: Auditory Comprehension: 5-Understands basic 90% of the time/requires cueing < 10% of the time Expression Expression Mode: Verbal Expression: 5-Expresses basic 90% of the time/requires cueing < 10% of the time. Social Interaction Social Interaction: 4-Interacts appropriately 75 - 89% of the time - Needs redirection for appropriate language or to initiate interaction. Problem Solving Problem Solving: 4-Solves basic 75 - 89% of the time/requires cueing 10 - 24% of the time Memory Memory: 4-Recognizes or recalls 75 - 89% of the time/requires cueing 10 - 24% of the time FIM - Eating Eating Activity: 5: Needs verbal cues/supervision General    Pain Pain Assessment Pain Assessment: No/denies pain  Therapy/Group: Individual Therapy  Austin [name] 09/25/2013, 4:44 PM

## 2013-09-26 ENCOUNTER — Inpatient Hospital Stay (HOSPITAL_COMMUNITY): Payer: Medicare Other | Admitting: Rehabilitation

## 2013-09-26 ENCOUNTER — Inpatient Hospital Stay (HOSPITAL_COMMUNITY): Payer: Medicare Other | Admitting: Speech Pathology

## 2013-09-26 ENCOUNTER — Encounter (HOSPITAL_COMMUNITY): Payer: Medicare Other

## 2013-09-26 ENCOUNTER — Inpatient Hospital Stay (HOSPITAL_COMMUNITY): Payer: Medicare Other | Admitting: *Deleted

## 2013-09-26 LAB — GLUCOSE, CAPILLARY
Glucose-Capillary: 97 mg/dL (ref 70–99)
Glucose-Capillary: 99 mg/dL (ref 70–99)
Glucose-Capillary: 126 mg/dL — ABNORMAL HIGH (ref 70–99)
Glucose-Capillary: 72 mg/dL (ref 70–99)
Glucose-Capillary: 168 mg/dL — ABNORMAL HIGH (ref 70–99)

## 2013-09-26 MED ORDER — STARCH (THICKENING) PO POWD
ORAL | Status: DC | PRN
  Filled 2013-09-26: qty 227

## 2013-09-26 MED ORDER — BETHANECHOL CHLORIDE 25 MG PO TABS
25.0000 mg | ORAL_TABLET | Freq: Three times a day (TID) | ORAL | Status: DC
  Administered 2013-09-26 – 2013-09-27 (×6): 25 mg via ORAL
  Filled 2013-09-26 (×10): qty 1

## 2013-09-26 NOTE — Progress Notes (Addendum)
Physical Therapy Session Note  Patient Details  Name: Austin [name] MRN: [id number] Date of Birth: [birth year]  Today's Date: 09/26/2013 PT Individual Time: 1100-1200 PT Individual Time Calculation (min): 60 min   Short Term Goals: Week 3:  PT Short Term Goal 1 (Week 3): Patient will perform bed mobility with maxA x1. PT Short Term Goal 1 - Progress (Week 3): Met PT Short Term Goal 2 (Week 3): Patient will perform unsupported static sitting balance with supervision/verbal cues. PT Short Term Goal 2 - Progress (Week 3): Met PT Short Term Goal 3 (Week 3): Patient will perform gait training 28' with +2 assist. PT Short Term Goal 3 - Progress (Week 3): Progressing toward goal Week 4:  PT Short Term Goal 1 (Week 4): Patient will perform bed mobility with modA x1. PT Short Term Goal 2 (Week 4): Patient will perform functional transfers with modA x1. PT Short Term Goal 3 (Week 4): Patient will perform wheelchair mobility 101' x1 with minA.  Skilled Therapeutic Interventions/Progress Updates:  Tx focused on WC mobility, NMR, and family training with wife (bed mobility, WC ramp/parts, and squat-pivot transfers from various surfaces). Reinforced education on WC parts management and WC set-up for transfers. Provided basic back to increase trunk flexion in sitting/extensor tone reduction. Donned Give-Mohr sling for safe LUE positioning and comfort during transfers and showed wife how to position sling.   Pt propelled WC in controlled setting x100' with Min A for steering and multi-modal cues for maintaining straight path. Pt continues to veer L, is able to identify errors, but has difficulty correcting. Performed W mobility in home setting x25' with tight turns and min A for steering with cues for foot use.   Instructed pt/wife in WC ramp mobility, provided demonstration and wife return-demonstrated x2 with cues for safety and positioning.   Performed serial WC<>bed transfers x8 (therapist x4 and  wife x4) with up to Max A to lift and safely complete turn using Bobath Method. Pt needs manual facilitation for sufficient anterior translation.  Pt's hands placed around helper's waist to reduce pushing. Wife provided with cues for set-up (leg rests, arm rest off, distance to surface), positioning, sequence, counting to provide timing for pt, and safe body mechanics. Wife is able to return demo, but therapist continues to need to assist up to Mod A as well for safety.  Also performed squat-pivot to low couch in apartment to simulate home environment.   NMR performed via forced use, manual facilitation, and multimodal cues throughout transfers as well as L-sided attention tasks during [facility] mobility and transfer training.   Pt wanting to return to bed, needing Mod A for bil LEs to supine. Wife able to assist, but needed cues for body mechanics and pt needed cues for maintaining rolled/flexed position.   Instructed wife on donning/positioning resting hand splint (remover give-Mohr for resting in bed).   Once in bed, pt needing to use bathroom, called NT for assist.  Pt/wife have no further questions at this time.       Therapy Documentation Precautions:  Precautions Precautions: Fall Precaution Comments: peg Required Braces or Orthoses: Cervical Brace (cervical brace d/c'd) Cervical Brace: Hard collar (cervical brace d/c'd) Other Brace/Splint: R wrist splint and L cock up splint Restrictions Weight Bearing Restrictions: Yes RUE Weight Bearing: Weight bearing as tolerated LUE Weight Bearing: Weight bearing as tolerated RLE Weight Bearing: Weight bearing as tolerated LLE Weight Bearing: Non weight bearing Other Position/Activity Restrictions: WBAT on Rt UE per ORTHO note  Pain: Pain Assessment Pain Assessment: No/denies pain    Locomotion : Wheelchair Mobility Distance: 100   See FIM for current functional status  Therapy/Group: Individual Therapy Kennieth Rad, PT,  DPT  09/26/2013, 12:06 PM

## 2013-09-26 NOTE — Progress Notes (Signed)
Occupational Therapy Session Note  Patient Details  Name: Austin [name] MRN: [id number] Date of Birth: May 29, 1932  Today's Date: 09/26/2013 OT Individual Time:  - 1400-1500  (60 min)      Short Term Goals: Week 1:  OT Short Term Goal 1 (Week 1): Pt will sit EOB for 15 min during functional task with max assist OT Short Term Goal 1 - Progress (Week 1): Met OT Short Term Goal 2 (Week 1): Patient will visual scan to left to locate 2/3 self-care items with max multimodal cues OT Short Term Goal 2 - Progress (Week 1): Met OT Short Term Goal 3 (Week 1): Patient will follow motor command within 20 seconds during self-care task OT Short Term Goal 3 - Progress (Week 1): Partly met OT Short Term Goal 4 (Week 1): Patient initiate anterior weight shift in preparation for functional transfer with max cues.  OT Short Term Goal 4 - Progress (Week 1): Not met Week 2:  OT Short Term Goal 1 (Week 2): Patient initiate anterior weight shift in preparation for functional transfer with max cues.  OT Short Term Goal 1 - Progress (Week 2): Met OT Short Term Goal 2 (Week 2): Patient will initate bathing 3/10 body parts with mod cues OT Short Term Goal 2 - Progress (Week 2): Met OT Short Term Goal 3 (Week 2): Patient will complete shower/toilet transfer with max assist x1 OT Short Term Goal 3 - Progress (Week 2): Progressing toward goal OT Short Term Goal 4 (Week 2): Patient will sustain attention to therapeutic activity for 10 seconds in controlled environment OT Short Term Goal 4 - Progress (Week 2): Met Week 3:  OT Short Term Goal 1 (Week 3): Patient will complete toilet or shower transfer to rolling showre chair with max assist +1 OT Short Term Goal 1 - Progress (Week 3): Partly met OT Short Term Goal 2 (Week 3): Patient will sit unsupported at supervision level for 45 seconds during functional task  OT Short Term Goal 2 - Progress (Week 3): Met OT Short Term Goal 3 (Week 3): Patient will demonstrate  selective attention for 15 seconds with min cues in busy environment OT Short Term Goal 3 - Progress (Week 3): Progressing toward goal OT Short Term Goal 4 (Week 3): Patient will stand at sink with max assist to complete 1 LB self-care task for 10 seconds OT Short Term Goal 4 - Progress (Week 3): Met  Skilled Therapeutic Interventions/Progress Updates:    Pt. Sleeping in bed upon OT arrival.  Wife present.  She reported pt had a busy morning with 4 therapies and this being the 5th.  Pt. Agreed to do PROM to LUE.  Treatment techniques included PNF, myotherapy, myofacial techniques, AAROM, PROM and deep tissue massage.  Pt demonstrated increased tightness in Left external rotation and pectoralis muscles.  He reported pain initially, but stated further into the session, the treatment helped him to relax and felt good.  Achieved 80 shoulder abduction and 10 external rotation.  Pt. Left in bed wiith all needs within reach.   Therapy Documentation Precautions:  Precautions Precautions: Fall Precaution Comments: peg Required Braces or Orthoses: Cervical Brace (cervical brace d/c'd) Cervical Brace: Hard collar (cervical brace d/c'd) Other Brace/Splint: R wrist splint and L cock up splint Restrictions Weight Bearing Restrictions: Yes RUE Weight Bearing: Weight bearing as tolerated LUE Weight Bearing: Weight bearing as tolerated RLE Weight Bearing: Weight bearing as tolerated LLE Weight Bearing: Non weight bearing Other Position/Activity Restrictions: WBAT  on Rt UE per ORTHO note      Pain:  None reported    See FIM for current functional status  Therapy/Group: Individual Therapy  Lisa Roca 09/26/2013, 2:59 PM

## 2013-09-26 NOTE — Progress Notes (Signed)
Physical Therapy Session Note  Patient Details  Name: Austin [name] MRN: [id number] Date of Birth: [birth year]  Today's Date: 09/26/2013 PT Individual Time: 0930-1031 PT Individual Time Calculation (min): 61 min   Short Term Goals: Week 3:  PT Short Term Goal 1 (Week 3): Patient will perform bed mobility with maxA x1. PT Short Term Goal 1 - Progress (Week 3): Met PT Short Term Goal 2 (Week 3): Patient will perform unsupported static sitting balance with supervision/verbal cues. PT Short Term Goal 2 - Progress (Week 3): Met PT Short Term Goal 3 (Week 3): Patient will perform gait training 56' with +2 assist. PT Short Term Goal 3 - Progress (Week 3): Progressing toward goal  Skilled Therapeutic Interventions/Progress Updates:   Pt received sitting in w/c in room, agreeable to session this morning.  States that he did not get a lot of sleep last night.  Pt propelled x 50' towards gym using R hemi technique at min A level with cues to continue task, as he was easily externally distracted in hallway.  Wife not present during session to perform family education, therefore skilled session focused on functional transfers to/from mat, to/from car, standing for NMR and to decrease pusher tendencies and also scooting task on mat to improve transfers and forward weight shift.   Performed all squat pivot transfers at mod/max A (max to the R) due to increased pusher tendencies.  Performed in Bobath technique to increase forward weight shift.  Once on mat, worked on standing reaching activities to the R and L for balance, NMR, and sustained attention.  Requires max A to maintain standing due to pushing.  Worked on scooting R and L, as well as sitting balance on balance pad for increased trunk control.  Pt does well correcting to midline with facilitation at R hip.  Ended session with car transfer at max A level and continued cues and facilitation for forward weight shift and total A to manage LLE during  transfer.  Pt assisted back to room and left in w/c with QRB donned and all needs in reach.    Therapy Documentation Precautions:  Precautions Precautions: Fall Precaution Comments: peg Required Braces or Orthoses: Cervical Brace (cervical brace d/c'd) Cervical Brace: Hard collar (cervical brace d/c'd) Other Brace/Splint: R wrist splint and L cock up splint Restrictions Weight Bearing Restrictions: Yes RUE Weight Bearing: Weight bearing as tolerated LUE Weight Bearing: Weight bearing as tolerated RLE Weight Bearing: Weight bearing as tolerated LLE Weight Bearing: Non weight bearing Other Position/Activity Restrictions: WBAT on Rt UE per ORTHO note   Pain: Pain Assessment Pain Assessment: No/denies pain  See FIM for current functional status  Therapy/Group: Individual Therapy  Denice Bors 09/26/2013, 10:47 AM
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
## 2013-09-26 NOTE — Progress Notes (Signed)
Speech Language Pathology Daily Session Note  Patient Details  Name: Austin [name] MRN: [id number] Date of Birth: 1932-10-14  Today's Date: 09/26/2013 SLP Individual Time: 0830-0900 SLP Individual Time Calculation (min): 30 min  Short Term Goals: Week 4: SLP Short Term Goal 1 (Week 4): Pt will utilize swallowing compensatory strategies with trials of puree textures and honey thick liquids to minimize overt s/s of aspiration with Min A multimodal cues. SLP Short Term Goal 2 (Week 4): Pt will utilize an increased vocal intensity at the word level with Min A multimodal cues.  SLP Short Term Goal 3 (Week 4): Pt will utilize external memory aids to recall new, daily information with Mod I multimodal cues.  SLP Short Term Goal 4 (Week 4): Pt will identify 2 cognitive and 2 physical deficit with Supervision multimodal cues.  SLP Short Term Goal 5 (Week 4): Pt will initiate functional tasks with Min A multimodal cues.  SLP Short Term Goal 6 (Week 4): Pt will demonstrate sustained attention to functional tasks for 20 minutes with Min A multimodal cues.   Skilled Therapeutic Interventions: Skilled treatment session focused on addressing dysphagia goals. SLP facilitated session with Mod question cues to problem solve tray set-up and to utilize safe swallow strategies.  Patient required Mod increased to Max mulimodal cues to sustain attention to and initiate self-feeding; as a result, patient consumed minimal amounts of Dys .1 textures and honey-thick liquids during breakfast with no overt s/s of aspiration.  Patient reported being fatigued today and SLP education patient on importance PO intake when awake and alert with PEG tube still being an option when he is fatigued.  RN was notified so tube feed could be administered.  Of note, RN reported good intake and tolertion with dinner last night.   Continue with current plan of care.    FIM:  Comprehension Comprehension Mode: Auditory Comprehension:  5-Understands basic 90% of the time/requires cueing < 10% of the time Expression Expression Mode: Verbal Expression: 4-Expresses basic 75 - 89% of the time/requires cueing 10 - 24% of the time. Needs helper to occlude trach/needs to repeat words. Social Interaction Social Interaction: 4-Interacts appropriately 75 - 89% of the time - Needs redirection for appropriate language or to initiate interaction. Problem Solving Problem Solving: 3-Solves basic 50 - 74% of the time/requires cueing 25 - 49% of the time Memory Memory: 4-Recognizes or recalls 75 - 89% of the time/requires cueing 10 - 24% of the time FIM - Eating Eating Activity: 5: Needs verbal cues/supervision  Pain Pain Assessment Pain Assessment: No/denies pain  Therapy/Group: Individual Therapy  [name]., CCC-SLP [id number]  [place] 09/26/2013, 9:37 AM
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
## 2013-09-26 NOTE — Progress Notes (Signed)
Occupational Therapy Session Note  Patient Details  Name: Austin [name] MRN: [id number] Date of Birth: 11/03/1932  Today's Date: 09/26/2013 OT Individual Time: 0730-0830 OT Individual Time Calculation (min): 60 min    Short Term Goals: Week 4:  OT Short Term Goal 1 (Week 4): Focus on LTGs and family education/training  Skilled Therapeutic Interventions/Progress Updates:    Pt seen for ADL retraining with focus on postural control, functional transfers, balance, initiation, and problem solving. Pt received supine in bed. Completed squat pivot transfer bed>w/c with max assist and mod cues for anterior weight shift. Engaged in bathing at sink with therapist providing hand over hand assist to LUE when washing RUE and upper LLE cues for pt to visually attend to LUE. Completed sit<>stand with +2 assist and no upper extremity support. Pt with less pushing noted during sit<>stand. Emphasis on anterior weight shift during stand>sit to facilitate WB through LLE. Pt with improved carryover of new learning task for hemi washing with RUE, requiring min cues to completed. Pt required max cues and total assist for new learning to don sock. Completed oral care with supervision cues from w/c level. Pt left sitting in w/c with LUE supported and all needs in reach.   Therapy Documentation Precautions:  Precautions Precautions: Fall Precaution Comments: peg Required Braces or Orthoses: Cervical Brace (cervical brace d/c'd) Cervical Brace: Hard collar (cervical brace d/c'd) Other Brace/Splint: R wrist splint and L cock up splint Restrictions Weight Bearing Restrictions: Yes RUE Weight Bearing: Weight bearing as tolerated LUE Weight Bearing: Weight bearing as tolerated RLE Weight Bearing: Weight bearing as tolerated LLE Weight Bearing: Non weight bearing Other Position/Activity Restrictions: WBAT on Rt UE per ORTHO note General:   Vital Signs:   Pain: No report of pain during therapy  session.  See FIM for current functional status  Therapy/Group: Individual Therapy  Duayne Cal 09/26/2013, 10:46 AM

## 2013-09-27 ENCOUNTER — Inpatient Hospital Stay (HOSPITAL_COMMUNITY): Payer: Medicare Other | Admitting: Speech Pathology

## 2013-09-27 ENCOUNTER — Inpatient Hospital Stay (HOSPITAL_COMMUNITY): Payer: Medicare Other | Admitting: Occupational Therapy

## 2013-09-27 ENCOUNTER — Ambulatory Visit (HOSPITAL_COMMUNITY): Payer: Medicare Other | Admitting: Speech Pathology

## 2013-09-27 ENCOUNTER — Inpatient Hospital Stay (HOSPITAL_COMMUNITY): Payer: Medicare Other | Admitting: Physical Therapy

## 2013-09-27 LAB — GLUCOSE, CAPILLARY
Glucose-Capillary: 93 mg/dL (ref 70–99)
Glucose-Capillary: 118 mg/dL — ABNORMAL HIGH (ref 70–99)
Glucose-Capillary: 102 mg/dL — ABNORMAL HIGH (ref 70–99)
Glucose-Capillary: 96 mg/dL (ref 70–99)

## 2013-09-27 NOTE — Progress Notes (Signed)
Occupational Therapy Session Note  Patient Details  Name: Austin [name] MRN: [id number] Date of Birth: [birth year]  Today's Date: 09/27/2013 OT Individual Time: 1330-1415 OT Individual Time Calculation (min): 45 min    Short Term Goals: Week 1:  OT Short Term Goal 1 (Week 1): Pt will sit EOB for 15 min during functional task with max assist OT Short Term Goal 1 - Progress (Week 1): Met OT Short Term Goal 2 (Week 1): Patient will visual scan to left to locate 2/3 self-care items with max multimodal cues OT Short Term Goal 2 - Progress (Week 1): Met OT Short Term Goal 3 (Week 1): Patient will follow motor command within 20 seconds during self-care task OT Short Term Goal 3 - Progress (Week 1): Partly met OT Short Term Goal 4 (Week 1): Patient initiate anterior weight shift in preparation for functional transfer with max cues.  OT Short Term Goal 4 - Progress (Week 1): Not met Week 2:  OT Short Term Goal 1 (Week 2): Patient initiate anterior weight shift in preparation for functional transfer with max cues.  OT Short Term Goal 1 - Progress (Week 2): Met OT Short Term Goal 2 (Week 2): Patient will initate bathing 3/10 body parts with mod cues OT Short Term Goal 2 - Progress (Week 2): Met OT Short Term Goal 3 (Week 2): Patient will complete shower/toilet transfer with max assist x1 OT Short Term Goal 3 - Progress (Week 2): Progressing toward goal OT Short Term Goal 4 (Week 2): Patient will sustain attention to therapeutic activity for 10 seconds in controlled environment OT Short Term Goal 4 - Progress (Week 2): Met Week 3:  OT Short Term Goal 1 (Week 3): Patient will complete toilet or shower transfer to rolling showre chair with max assist +1 OT Short Term Goal 1 - Progress (Week 3): Partly met OT Short Term Goal 2 (Week 3): Patient will sit unsupported at supervision level for 45 seconds during functional task  OT Short Term Goal 2 - Progress (Week 3): Met OT Short Term Goal 3 (Week  3): Patient will demonstrate selective attention for 15 seconds with min cues in busy environment OT Short Term Goal 3 - Progress (Week 3): Progressing toward goal OT Short Term Goal 4 (Week 3): Patient will stand at sink with max assist to complete 1 LB self-care task for 10 seconds OT Short Term Goal 4 - Progress (Week 3): Met  Skilled Therapeutic Interventions/Progress Updates:    Pt seen this session to address LUE ROM, dynamic reaching from sitting, and sit to stand skills at the sink. Pt's wife present the entire session. Pt provided with massage and PROM to increase ROM of L shoulder and wrist. Pt worked on towel slides on table with full A for ROM to attempt to facilitate muscular activity.  No active reflex with muscle tapping, but pt stated he could feel the tapping. Mod cues to maintain visual focus on LUE. Educated wife and pt on self ROM exercises with clasping hands together and ranging elbow and wrist. Rehab tech arrived to provide A with sit to stand skills at sink. Pt was able to push from sit to stand with only max A x1 and stood for up to 1 min 2x but with max A support from 2 people to maintain his postural alignment and maintain his L foot position.  Total A stand to sit as pt had great difficulty leaning forward.  Pt then worked on dynamic reaching of  RUE in all directions to facilitate trunk control and forward lean by sliding hand back and forth on table.   Pt's parents arrived at end of session.  Pt resting in chair with lap tray and leg rests. QRB not applied as 3 family members were with patient.  Pt's wife stated she would ask for A to apply QRB when they needed to leave.  Therapy Documentation Precautions:  Precautions Precautions: Fall Precaution Comments: peg Required Braces or Orthoses: Cervical Brace (cervical brace d/c'd) Cervical Brace: Hard collar (cervical brace d/c'd) Other Brace/Splint: R wrist splint and L cock up splint Restrictions Weight Bearing  Restrictions: Yes RUE Weight Bearing: Weight bearing as tolerated LUE Weight Bearing: Weight bearing as tolerated RLE Weight Bearing: Weight bearing as tolerated LLE Weight Bearing: Non weight bearing Other Position/Activity Restrictions: WBAT on Rt UE per ORTHO note Vital Signs: Therapy Vitals Temp: 97.6 F (36.4 C) Temp src: Oral Pulse Rate: 68 Resp: 18 BP: 118/68 mmHg Patient Position (if appropriate): Sitting Oxygen Therapy SpO2: 100 % O2 Device: None (Room air) Pain: Pain Assessment Pain Assessment: No/denies pain  See FIM for current functional status  Therapy/Group: Individual Therapy  [place] 09/27/2013, 3:42 PM
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
## 2013-09-27 NOTE — Progress Notes (Signed)
Chart and note reviewed. Agree with note.  Molli Barrows, RD, LDN, [place] Pager [phone] After Hours Pager [phone]

## 2013-09-27 NOTE — Progress Notes (Signed)
Speech Language Pathology Daily Session Note  Patient Details  Name: Austin [name] MRN: [id number] Date of Birth: [birth year]  Today's Date: 09/27/2013 SLP Individual Time: 1205-1235 SLP Individual Time Calculation (min): 30 min  Short Term Goals: Week 4: SLP Short Term Goal 1 (Week 4): Pt will utilize swallowing compensatory strategies with trials of puree textures and honey thick liquids to minimize overt s/s of aspiration with Min A multimodal cues. SLP Short Term Goal 2 (Week 4): Pt will utilize an increased vocal intensity at the word level with Min A multimodal cues.  SLP Short Term Goal 3 (Week 4): Pt will utilize external memory aids to recall new, daily information with Mod I multimodal cues.  SLP Short Term Goal 4 (Week 4): Pt will identify 2 cognitive and 2 physical deficit with Supervision multimodal cues.  SLP Short Term Goal 5 (Week 4): Pt will initiate functional tasks with Min A multimodal cues.  SLP Short Term Goal 6 (Week 4): Pt will demonstrate sustained attention to functional tasks for 20 minutes with Min A multimodal cues.   Skilled Therapeutic Interventions: Skilled treatment session focused on addressing dysphagia goals. SLP facilitated session with tray-set up and Mod question and demonstrative cues to utilize safe swallow strategies.  Patient required Mod mulimodal cues to sustain attention to and initiate self-feeding of Dys .1 textures and honey-thick liquids during lunch with cough x2  During periods of no PO intake which could be a result of residue or increase saliva production.  Patient reported being incontinent and nurse tech was called and notified that patient wanted to finish lunch after being cleaned up.  Continue with current plan of care.    FIM:  Comprehension Comprehension Mode: Auditory Comprehension: 5-Understands basic 90% of the time/requires cueing < 10% of the time Expression Expression Mode: Verbal Expression: 4-Expresses basic 75 - 89% of  the time/requires cueing 10 - 24% of the time. Needs helper to occlude trach/needs to repeat words. Social Interaction Social Interaction: 4-Interacts appropriately 75 - 89% of the time - Needs redirection for appropriate language or to initiate interaction. Problem Solving Problem Solving: 3-Solves basic 50 - 74% of the time/requires cueing 25 - 49% of the time Memory Memory: 3-Recognizes or recalls 50 - 74% of the time/requires cueing 25 - 49% of the time FIM - Eating Eating Activity: 5: Needs verbal cues/supervision  Pain Pain Assessment Pain Assessment: No/denies pain  Therapy/Group: Individual Therapy  [name]., [place] [id number]  [place] 09/27/2013, 12:19 PM

## 2013-09-28 ENCOUNTER — Inpatient Hospital Stay (HOSPITAL_COMMUNITY): Payer: Medicare Other | Admitting: Physical Therapy

## 2013-09-28 DIAGNOSIS — S069X9A Unspecified intracranial injury with loss of consciousness of unspecified duration, initial encounter: Secondary | ICD-10-CM

## 2013-09-28 DIAGNOSIS — Z5189 Encounter for other specified aftercare: Secondary | ICD-10-CM

## 2013-09-28 DIAGNOSIS — S069XAA Unspecified intracranial injury with loss of consciousness status unknown, initial encounter: Secondary | ICD-10-CM

## 2013-09-28 LAB — GLUCOSE, CAPILLARY
Glucose-Capillary: 95 mg/dL (ref 70–99)
Glucose-Capillary: 138 mg/dL — ABNORMAL HIGH (ref 70–99)
Glucose-Capillary: 104 mg/dL — ABNORMAL HIGH (ref 70–99)
Glucose-Capillary: 162 mg/dL — ABNORMAL HIGH (ref 70–99)

## 2013-09-28 MED ORDER — BETHANECHOL CHLORIDE 10 MG PO TABS
10.0000 mg | ORAL_TABLET | Freq: Three times a day (TID) | ORAL | Status: DC
  Administered 2013-09-28 (×2): 10 mg via ORAL
  Filled 2013-09-28 (×6): qty 1

## 2013-09-28 NOTE — Progress Notes (Signed)
Physical Therapy Note  Patient Details  Name: Austin [name] MRN: [id number] Date of Birth: February 22, 1932 Today's Date: 09/28/2013    Time: 1315-1345 30 minutes  1:1 No c/o pain.  Pt's wife present for session.  Pt and wife request PT to work with pt's L UE as they felt it helped him yesterday.  AAROM with tapping, towel slides for shoulder, elbow and wrist.  Palpable contraction biceps after AAROM and tapping.  PROM L scapula, L UE with pt stating "It feels much better".  Massage to decrease L hand swelling, improved with treatment.   Kesi Perrow 09/28/2013, 1:44 PM
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
## 2013-09-29 ENCOUNTER — Inpatient Hospital Stay (HOSPITAL_COMMUNITY): Payer: Medicare Other | Admitting: Speech Pathology

## 2013-09-29 ENCOUNTER — Inpatient Hospital Stay (HOSPITAL_COMMUNITY): Payer: Medicare Other | Admitting: *Deleted

## 2013-09-29 ENCOUNTER — Inpatient Hospital Stay (HOSPITAL_COMMUNITY): Payer: Medicare Other

## 2013-09-29 ENCOUNTER — Encounter (HOSPITAL_COMMUNITY): Payer: Medicare Other | Admitting: Occupational Therapy

## 2013-09-29 LAB — GLUCOSE, CAPILLARY
Glucose-Capillary: 98 mg/dL (ref 70–99)
Glucose-Capillary: 105 mg/dL — ABNORMAL HIGH (ref 70–99)
Glucose-Capillary: 92 mg/dL (ref 70–99)
Glucose-Capillary: 120 mg/dL — ABNORMAL HIGH (ref 70–99)

## 2013-09-29 NOTE — Progress Notes (Signed)
Physical Therapy Session Note  Patient Details  Name: Austin [name] MRN: [id number] Date of Birth: [birth year]  Today's Date: 09/29/2013 PT Individual Time: [id number] PT Individual Time Calculation (min): 30 min   Short Term Goals: Week 4:  PT Short Term Goal 1 (Week 4): Patient will perform bed mobility with modA x1. PT Short Term Goal 2 (Week 4): Patient will perform functional transfers with modA x1. PT Short Term Goal 3 (Week 4): Patient will perform wheelchair mobility 39' x1 with minA.  Skilled Therapeutic Interventions/Progress Updates:  1:1. Pt received sitting in w/c, ready for therapy. Wife present, unable to physically participate in family training due to reported gout but visually observed car t/f traning. Pt req total A w/ max multimodal cues for safe completion of car transfer w/ use of bobath technique due to extensor tone and pushing R with use of R UE. Discussion regarding use of towel in car seat to assist with final positioning into car. Pt noted to be incontinent of urine at end of session, transported back to room. RN made aware and verbalized that she would be into assist pt with clean up. Pt left w/ all needs in reach, call bell on.   Therapy Documentation Precautions:  Precautions Precautions: Fall Precaution Comments: peg Required Braces or Orthoses: Cervical Brace (cervical brace d/c'd) Cervical Brace: Hard collar (cervical brace d/c'd) Other Brace/Splint: R wrist splint and L cock up splint Restrictions Weight Bearing Restrictions: No RUE Weight Bearing: Weight bearing as tolerated LUE Weight Bearing: Weight bearing as tolerated RLE Weight Bearing: Weight bearing as tolerated LLE Weight Bearing: Non weight bearing Other Position/Activity Restrictions: WBAT on Rt UE per ORTHO note  See FIM for current functional status  Therapy/Group: Individual Therapy  [name] 09/29/2013, 5:15 PM

## 2013-09-29 NOTE — Progress Notes (Signed)
Social Work Patient ID: [name], male   DOB: 01/24/1932, 80 y.o.   MRN: [id number]  Met with wife and friend, Sherle Poe, Friday afternoon to discuss pt's anticipated care needs at d/c.  Mr. Valda Lamb planning to follow up with potential caregivers and review that information.  Contacted by Mr. Flack this morning with him confirming that two caregivers to be here tomorrow from 9-12 for education.  Have alerted treatment team and placed on scheduling board.  I will also follow up with them while they are here.  Francess Mullen, LCSW
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
## 2013-09-29 NOTE — Progress Notes (Signed)
Speech Language Pathology Daily Session Note  Patient Details  Name: Austin [name] MRN: [id number] Date of Birth: 1932/04/20  Today's Date: 09/29/2013 SLP Individual Time: 1000-1100 SLP Individual Time Calculation (min): 60 min  Short Term Goals: Week 4: SLP Short Term Goal 1 (Week 4): Pt will utilize swallowing compensatory strategies with trials of puree textures and honey thick liquids to minimize overt s/s of aspiration with Min A multimodal cues. SLP Short Term Goal 2 (Week 4): Pt will utilize an increased vocal intensity at the word level with Min A multimodal cues.  SLP Short Term Goal 3 (Week 4): Pt will utilize external memory aids to recall new, daily information with Mod I multimodal cues.  SLP Short Term Goal 4 (Week 4): Pt will identify 2 cognitive and 2 physical deficit with Supervision multimodal cues.  SLP Short Term Goal 5 (Week 4): Pt will initiate functional tasks with Min A multimodal cues.  SLP Short Term Goal 6 (Week 4): Pt will demonstrate sustained attention to functional tasks for 20 minutes with Min A multimodal cues.   Skilled Therapeutic Interventions: Skilled treatment session focused on cognitive-linguistic goals. Upon arrival, pt was lethargic while sitting upright in his wheelchair. SLP facilitated session by providing Mod A multimodal cues for functional problem solving during a 4 step sequencing task and Supervision A during a basic mathematical task. The patient also required Mod verbal cues for utilization of increased vocal intensity at the phrase level during a verbal description task. Patient was lethargic throughout the session and required Max multimodal cues to recall events from previous therapy sessions.  Continue with current plan of care.    FIM:  Comprehension Comprehension Mode: Auditory Comprehension: 5-Understands complex 90% of the time/Cues < 10% of the time Expression Expression Mode: Verbal Expression: 4-Expresses basic 75 - 89% of  the time/requires cueing 10 - 24% of the time. Needs helper to occlude trach/needs to repeat words. Social Interaction Social Interaction: 4-Interacts appropriately 75 - 89% of the time - Needs redirection for appropriate language or to initiate interaction. Problem Solving Problem Solving: 3-Solves basic 50 - 74% of the time/requires cueing 25 - 49% of the time Memory Memory: 3-Recognizes or recalls 50 - 74% of the time/requires cueing 25 - 49% of the time  Pain Pain Assessment Pain Assessment: No/denies pain Pain Score: 0-No pain  Therapy/Group: Individual Therapy  Khayla Koppenhaver 09/29/2013, 12:51 PM

## 2013-09-29 NOTE — Plan of Care (Signed)
Problem: RH Balance Goal: LTG Patient will maintain dynamic sitting balance (PT) LTG: Patient will maintain dynamic sitting balance with assistance during mobility activities (PT)  Downgraded 09/29/13 due to lack of progress Goal: LTG Patient will maintain dynamic standing balance (PT) LTG: Patient will maintain dynamic standing balance with assistance during mobility activities (PT)  Outcome: Not Applicable Date Met:  79/48/01 Discharged 09/29/13 due to lack of progress  Problem: RH Bed Mobility Goal: LTG Patient will perform bed mobility with assist (PT) LTG: Patient will perform bed mobility with assistance, with/without cues (PT).  Downgraded 09/29/13 due to lack of progress  Problem: RH Bed to Chair Transfers Goal: LTG Patient will perform bed/chair transfers w/assist (PT) LTG: Patient will perform bed/chair transfers with assistance, with/without cues (PT).  Downgraded 09/29/13 due to lack of progress  Problem: RH Car Transfers Goal: LTG Patient will perform car transfers with assist (PT) LTG: Patient will perform car transfers with assistance (PT).  Downgraded 09/29/13 due to lack of progress  Problem: RH Ambulation Goal: LTG Patient will ambulate in controlled environment (PT) LTG: Patient will ambulate in a controlled environment, # of feet with assistance (PT).  Discharged 09/29/13 due to lack of progress Goal: LTG Patient will ambulate in home environment (PT) LTG: Patient will ambulate in home environment, # of feet with assistance (PT).  Discharged 09/29/13 due to lack of progress  Problem: RH Wheelchair Mobility Goal: LTG Patient will propel w/c in community environment (PT) LTG: Patient will propel wheelchair in community environment, # of feet with assist (PT)  Downgraded 09/29/13 due to lack of progress  Problem: RH Stairs Goal: LTG Patient will ambulate up and down stairs w/assist (PT) LTG: Patient will ambulate up and down # of stairs with assistance (PT)  Discharged 09/29/13  due to lack of progress

## 2013-09-29 NOTE — Progress Notes (Signed)
Physical Therapy Session Note  Patient Details  Name: Austin [name] MRN: [id number] Date of Birth: [birth year]  Today's Date: 09/29/2013 PT Individual Time: 0900-1000 and 1400-1500 PT Individual Time Calculation (min): 60 min and 60 min  Short Term Goals: Week 4:  PT Short Term Goal 1 (Week 4): Patient will perform bed mobility with modA x1. PT Short Term Goal 2 (Week 4): Patient will perform functional transfers with modA x1. PT Short Term Goal 3 (Week 4): Patient will perform wheelchair mobility 68' x1 with minA.  Skilled Therapeutic Interventions/Progress Updates:   AM Session: Patient received semi-reclined in bed. Patient's wife/other caregivers not present for session. Session focused on sustained attention with functional mobility tasks of supine>sit, bed>wheelchair>toilet>wheelchair transfers, and wheelchair mobility. Emphasis on rolling to B sides to don pants while supine in bed. MaxA for supine>sit with HOB elevated, maxA squat pivot transfer bed>wheelchair and wheelchair>toilet, +2 for clothing and hygiene during toileting. Patient unable to void. Remainder of session focused on wheelchair mobility with use of R UE/LE, maxA for negotiation of obstacles on the L and sustained attention to task, as patient tends to stop activity after 5-15" of effort. Patient left sitting in wheelchair with seatbelt donned and all needs within reach.  PM Session: Patient received sitting in wheelchair, wife present. Wife requesting not to perform any hands on training today, citing gout in her big toe as reason not to participate. Discussion about installation of ramp; patient's wife reports that a man was taking measurements today and will start Herbalist. Plan for two caregivers to be present for family ed tomorrow morning. Remainder of session focused on wheelchair mobility and trunk/L LE NMR. Wheelchair mobility 100' x1 with R UE/LE and modA for negotiation of obstacles on the L and  sustained attention to task.   In gym, patient performed horseshoe toss in [place], reaching to the R, anterior and across midline (to facilitate increased weight bearing through L LE), requires close supervision to minA. Sit<>stands in [place] with R UE and without, minA with use of R UE, modA without use of R UE, significant posterior and to the L lean. Using [place], transferred to NuStep. NuStep x6' with B LEs and R UE, Level 2, manual facilitation at L LE to increase weight bearing. Patient returned to wheelchair via [place] and left sitting in wheelchair with seatbelt donned and all needs within reach, nurse tech present to take vitals.  Therapy Documentation Precautions:  Precautions Precautions: Fall Precaution Comments: peg Required Braces or Orthoses: Cervical Brace (cervical brace d/c'd) Cervical Brace: Hard collar (cervical brace d/c'd) Other Brace/Splint: R wrist splint and L cock up splint Restrictions Weight Bearing Restrictions: Yes RUE Weight Bearing: Weight bearing as tolerated Other Position/Activity Restrictions: WBAT on Rt UE per ORTHO note Pain: Pain Assessment Pain Assessment: No/denies pain Pain Score: 0-No pain Locomotion : Ambulation Ambulation/Gait Assistance: Not tested (comment) Wheelchair Mobility Distance: 150   See FIM for current functional status  Therapy/Group: Individual Therapy  Lillia Abed. Darcey Cardy, PT, DPT 09/29/2013, 10:02 AM

## 2013-09-29 NOTE — Progress Notes (Signed)
Occupational Therapy Session Note  Patient Details  Name: Austin [name] MRN: [id number] Date of Birth: 14-Dec-1932  Today's Date: 09/29/2013 OT Individual Time: 1105-1205 OT Individual Time Calculation (min): 60 min   Skilled Therapeutic Interventions/Progress Updates:    Pt performed bathing and dressing sit to stand at the sink.  Emphasis placed on sitting balance and sequencing during session.  Pt with increased pushing to the left side in sitting also with frequent LOB posteriorly when attempting to sit and wash his UB.  Pt also with increased extensor tone in the left elbow as well internal rotation tone.  He needs max assist for utilization of the LUE during bathing tasks.  Total assist +2 (pt 35%) for sit to stand with LB bathing and for pulling brief and pants over his bottom.  Increased trunk flexion noted as well as pushing to the right side in standing.  Pt with decreased ability to relax the LLE for attempted sitting and continues to push at all times.  Decreased ability for pt to cross his LEs for reaching his feet to dress.  He needed max facilitation at the trunk and hip to reach forward and donn his pants over his left foot as well as for pulling up his sock after therapist placed it on his feet.    Therapy Documentation Precautions:  Precautions Precautions: Fall Precaution Comments: peg Required Braces or Orthoses: Cervical Brace (cervical brace d/c'd) Cervical Brace: Hard collar (cervical brace d/c'd) Other Brace/Splint: R wrist splint and L cock up splint Restrictions Weight Bearing Restrictions: No RUE Weight Bearing: Weight bearing as tolerated LUE Weight Bearing: Weight bearing as tolerated RLE Weight Bearing: Weight bearing as tolerated LLE Weight Bearing: Non weight bearing Other Position/Activity Restrictions: WBAT on Rt UE per ORTHO note  Pain: Pain Assessment Pain Assessment: No/denies pain  See FIM for current functional status  Therapy/Group:  Individual Therapy  Prince [name],[name] OTR/L 09/29/2013, 2:26 PM

## 2013-09-30 ENCOUNTER — Inpatient Hospital Stay (HOSPITAL_COMMUNITY): Payer: Medicare Other | Admitting: Speech Pathology

## 2013-09-30 ENCOUNTER — Encounter (HOSPITAL_COMMUNITY): Payer: Medicare Other

## 2013-09-30 ENCOUNTER — Inpatient Hospital Stay (HOSPITAL_COMMUNITY): Payer: Medicare Other | Admitting: *Deleted

## 2013-09-30 LAB — GLUCOSE, CAPILLARY
Glucose-Capillary: 93 mg/dL (ref 70–99)
Glucose-Capillary: 106 mg/dL — ABNORMAL HIGH (ref 70–99)
Glucose-Capillary: 77 mg/dL (ref 70–99)
Glucose-Capillary: 115 mg/dL — ABNORMAL HIGH (ref 70–99)

## 2013-09-30 MED ORDER — SIMETHICONE 40 MG/0.6ML PO SUSP
80.0000 mg | Freq: Four times a day (QID) | ORAL | Status: DC | PRN
  Filled 2013-09-30: qty 1.2

## 2013-09-30 NOTE — Progress Notes (Addendum)
Speech Language Pathology Daily Session Note  Patient Details  Name: Austin [name] MRN: [id number] Date of Birth: 1932-11-12  Today's Date: 09/30/2013 SLP Individual Time: 1100-1200 SLP Individual Time Calculation (min): 60 min  Short Term Goals: Week 4: SLP Short Term Goal 1 (Week 4): Pt will utilize swallowing compensatory strategies with trials of puree textures and honey thick liquids to minimize overt s/s of aspiration with Min A multimodal cues. SLP Short Term Goal 2 (Week 4): Pt will utilize an increased vocal intensity at the word level with Min A multimodal cues.  SLP Short Term Goal 3 (Week 4): Pt will utilize external memory aids to recall new, daily information with Mod I multimodal cues.  SLP Short Term Goal 4 (Week 4): Pt will identify 2 cognitive and 2 physical deficit with Supervision multimodal cues.  SLP Short Term Goal 5 (Week 4): Pt will initiate functional tasks with Min A multimodal cues.  SLP Short Term Goal 6 (Week 4): Pt will demonstrate sustained attention to functional tasks for 20 minutes with Min A multimodal cues.   Skilled Therapeutic Interventions: Skilled treatment session focused on family/caregiver education with the patient's wife and caregiver, Erline Levine.  SLP facilitated session by providing verbal explanation and demonstration in regards to patient's current diet recommendations, appropriate textures, how to appropriately thicken liquids, swallowing compensatory strategies and appropriate cueing during meals to increase utilization of strategies and minimize overt s/s of aspiration.  Both verbalized understanding and returned demonstration with lunch meal of Dys. 1 textures with honey-thick liquids. Patient's wife and caregiver were also educated on strategies to utilize at home to increase attention, initiation, carryover, safety and overall cognitive function at home such as utilizing a routine and 24 hour supervision. Both verbalized understanding.   Handouts were also given to reinforce information.    FIM:  Comprehension Comprehension Mode: Auditory Comprehension: 5-Understands complex 90% of the time/Cues < 10% of the time Expression Expression Mode: Verbal Expression: 4-Expresses basic 75 - 89% of the time/requires cueing 10 - 24% of the time. Needs helper to occlude trach/needs to repeat words. Social Interaction Social Interaction: 4-Interacts appropriately 75 - 89% of the time - Needs redirection for appropriate language or to initiate interaction. Problem Solving Problem Solving: 3-Solves basic 50 - 74% of the time/requires cueing 25 - 49% of the time Memory Memory: 3-Recognizes or recalls 50 - 74% of the time/requires cueing 25 - 49% of the time FIM - Eating Eating Activity: 5: Set-up assist for open containers;5: Supervision/cues  Pain Pain Assessment Pain Assessment: No/denies pain Pain Score: 0-No pain  Therapy/Group: Individual Therapy  Donnette Macmullen, [place] 09/30/2013, 3:42 PM
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
## 2013-09-30 NOTE — Progress Notes (Signed)
Occupational Therapy Session Note  Patient Details  Name: Austin [name] MRN: [id number] Date of Birth: [birth year]  Today's Date: 09/30/2013 OT Individual Time: [id number] OT Individual Time Calculation (min): 48 min    Short Term Goals: Week 4:  OT Short Term Goal 1 (Week 4): Focus on LTGs and family education/training  Skilled Therapeutic Interventions/Progress Updates:    Pt seen for 1:1 OT session with focus on caregiver training/education, functional transfers, sit<>stand, and postural control. Pt received supine in bed. Completed supine>sit with mod assist. Pt's caregiver [name]) entered room for training/education. Educated caregiver on extensor tone management, pusher tendencies, body mechanics, safety concerns, use of bobath technique for transfers, types of cues, cognitive impairments, and CLOF. Completed squat pivot transfer via bobath technique to manage extensor tone with mod assist. Completed dressing at sink with +2 assist for sit<>stand then progressed to max assist. Caregiver required cues for positioning, body mechanics, and management of tone as she reported "did transfers differently." Provided extensive education on techniques and safety. Allowed for planned failure with sit<>stand as she required +2 to complete using her technique. Progressed to standing max assist +1 with therapist providing cues. Discussed positioning of BSC for squat pivot transfers then practiced with therapist completing using Bobath technique max assist +1. Caregiver declining/practcing technique as she reported "not what I do." Caregiver declining removing arm rest and required total assist for correct positioning of w/c. Utilized planned failure as caregiver completed stand pivot transfer with +2 assist d/t poor body mechanics and poor control of extensor tone. Completed squat pivot transfer back to w/c with therapist cueing for removal of arm rest and max-total assist +1. Discussed caregiver returning  for more education/training and she was open to idea of this. Per PT later in day, caregiver with improved carryover of education from both OT and PT. Will follow-up.   Therapy Documentation Precautions:  Precautions Precautions: Fall Precaution Comments: peg Required Braces or Orthoses: Cervical Brace (cervical brace d/c'd) Cervical Brace: Hard collar (cervical brace d/c'd) Other Brace/Splint: R wrist splint and L cock up splint Restrictions Weight Bearing Restrictions: Yes RUE Weight Bearing: Weight bearing as tolerated LUE Weight Bearing: Weight bearing as tolerated RLE Weight Bearing: Weight bearing as tolerated LLE Weight Bearing: Non weight bearing Other Position/Activity Restrictions: WBAT on Rt UE per ORTHO note General:   Vital Signs:   Pain: Pain Assessment Pain Assessment: No/denies pain  See FIM for current functional status  Therapy/Group: Individual Therapy  Duayne Cal 09/30/2013, 12:26 PM
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
## 2013-09-30 NOTE — Progress Notes (Signed)
Note and chart reviewed. Agree with nutrition plan.  Pryor Ochoa RD, LDN Inpatient Clinical Dietitian Pager: [phone] After Hours Pager: [phone]

## 2013-09-30 NOTE — Treatment Plan (Signed)
Extensive discussion about discharge planning today with patient's wife. She is currently unable to provide any answers or insight in regards to discharge planning (i.e. If ramp will be done, hours the caregivers will be in the home, routine/schedule for patient's OOB). Patient's wife repeatedly stating "We will just have to figure it out" or "We will just wait and see what's available." Urged wife that it is very important for treatment team to understand what will be available from a caregiver stand point in order to plan for discharge home and patient's schedule. Various aspects of patient's discharge plan remain unclear based on lack of information provided by wife despite multiple attempts from all members of the interdisciplinary team.  Teena Irani. Trelyn Vanderlinde, PT, DPT

## 2013-09-30 NOTE — Progress Notes (Signed)
Speech Language Pathology Daily Session Note  Patient Details  Name: Austin [name] MRN: [id number] Date of Birth: 1932-09-21  Today's Date: 09/30/2013 SLP Individual Time: 1300-1350 SLP Individual Time Calculation (min): 50 min  Short Term Goals: Week 4: SLP Short Term Goal 1 (Week 4): Pt will utilize swallowing compensatory strategies with trials of puree textures and honey thick liquids to minimize overt s/s of aspiration with Min A multimodal cues. SLP Short Term Goal 2 (Week 4): Pt will utilize an increased vocal intensity at the word level with Min A multimodal cues.  SLP Short Term Goal 3 (Week 4): Pt will utilize external memory aids to recall new, daily information with Mod I multimodal cues.  SLP Short Term Goal 4 (Week 4): Pt will identify 2 cognitive and 2 physical deficit with Supervision multimodal cues.  SLP Short Term Goal 5 (Week 4): Pt will initiate functional tasks with Min A multimodal cues.  SLP Short Term Goal 6 (Week 4): Pt will demonstrate sustained attention to functional tasks for 20 minutes with Min A multimodal cues.   Skilled Therapeutic Interventions: Skilled treatment session focused on addressing cognitive-linguistic goals.  SLP facilitated session with photos of familiar objects and requested patient verbally label items as well as compare and contrast them.  Patient required Mod decreased to Min assist question cues to utilize increased vocal intensity at the phrase-sentence level and required Supervision question cues to recall all information that needed to be included in his description.  Continue with current plan of care.    FIM:  Comprehension Comprehension Mode: Auditory Comprehension: 5-Understands complex 90% of the time/Cues < 10% of the time Expression Expression Mode: Verbal Expression: 4-Expresses basic 75 - 89% of the time/requires cueing 10 - 24% of the time. Needs helper to occlude trach/needs to repeat words. Social Interaction Social  Interaction: 4-Interacts appropriately 75 - 89% of the time - Needs redirection for appropriate language or to initiate interaction. Problem Solving Problem Solving: 3-Solves basic 50 - 74% of the time/requires cueing 25 - 49% of the time Memory Memory: 3-Recognizes or recalls 50 - 74% of the time/requires cueing 25 - 49% of the time FIM - Eating Eating Activity: 5: Set-up assist for open containers;5: Supervision/cues  Pain Pain Assessment Pain Assessment: No/denies pain Pain Score: 0-No pain  Therapy/Group: Individual Therapy  [name]., [place] [id number]  [place] 09/30/2013, 3:52 PM

## 2013-09-30 NOTE — Progress Notes (Signed)
Physical Therapy Session Note  Patient Details  Name: Austin [name] MRN: [id number] Date of Birth: 1932-02-14  Today's Date: 09/30/2013 PT Individual Time: 1000-1100 and 1530-1550 PT Individual Time Calculation (min): 60 min and 20 min (missed 40 min)  Short Term Goals: Week 4:  PT Short Term Goal 1 (Week 4): Patient will perform bed mobility with modA x1. PT Short Term Goal 2 (Week 4): Patient will perform functional transfers with modA x1. PT Short Term Goal 3 (Week 4): Patient will perform wheelchair mobility 64' x1 with minA.  Skilled Therapeutic Interventions/Progress Updates:    AM Session: Patient received sitting in wheelchair, caregiver, Austin [name] present for education/training session. Family education/training session for bed<>wheelchair squat pivot transfers with Austin [name]. Demonstration wheelchair<>bed performed first for observation. Extensive discussion/education about set up of tone/pusher tendencies, transfers, safety, technique, sequencing, body mechanics, and cues for wheelchair<>bed squat pivot transfers via Austin [name] technique. Austin [name] performed x4 bed<>wheelchair transfers and sit<>supine x2. Austin [name] performs all transfers safely and with proper techniques that best assist patient. Patient left sitting in standard wheelchair with seatbelt donned and all needs within reach, wife present.  Still unsure how many caregivers and how many hours will be present on daily basis, despite asking patient's wife several times. Additionally, Austin [name] stated that patient's wife has reported she will not be performing any hands on assistance.  PM Session: Patient received supine in bed, patient's wife requesting to let him rest secondary to just getting in bed 30 min ago. Extensive discussion about discharge planning. Patient's wife unable to provide any answers or insight in regards to discharge planning (i.e. If ramp will be done, hours the caregivers will be in the home, routine/schedule for patient's  OOB). Patient's wife repeatedly stating "We will just have to figure it out" or "We will just wait and see what's available." Urged wife that it is very important for treatment team to understand what will be available from a caregiver stand point in order to plan for discharge home and patient's schedule. Various aspects of patient's discharge plan remain unclear based on lack of information provided by wife despite multiple attempts from all members of the interdisciplinary team.  Therapy Documentation Precautions:  Precautions Precautions: Fall Precaution Comments: peg Required Braces or Orthoses: Cervical Brace (cervical brace d/c'd) Cervical Brace: Hard collar (cervical brace d/c'd) Other Brace/Splint: R wrist splint and L cock up splint Restrictions Weight Bearing Restrictions: Yes RUE Weight Bearing: Weight bearing as tolerated Other Position/Activity Restrictions: WBAT on Rt UE per ORTHO note General: PT Amount of Missed Time (min): 40 Minutes PT Missed Treatment Reason: Patient fatigue Pain: Pain Assessment Pain Assessment: No/denies pain Pain Score: 0-No pain Locomotion : Ambulation Ambulation/Gait Assistance: Not tested (comment)   See FIM for current functional status  Therapy/Group: Individual Therapy  Austin [name]. Austin [name], PT, DPT 09/30/2013, 5:04 PM

## 2013-10-01 ENCOUNTER — Inpatient Hospital Stay (HOSPITAL_COMMUNITY): Payer: Medicare Other | Admitting: *Deleted

## 2013-10-01 ENCOUNTER — Encounter (HOSPITAL_COMMUNITY): Payer: Medicare Other

## 2013-10-01 ENCOUNTER — Inpatient Hospital Stay (HOSPITAL_COMMUNITY): Payer: Medicare Other

## 2013-10-01 ENCOUNTER — Inpatient Hospital Stay (HOSPITAL_COMMUNITY): Payer: Medicare Other | Admitting: Speech Pathology

## 2013-10-01 LAB — URINALYSIS, ROUTINE W REFLEX MICROSCOPIC
Bilirubin Urine: NEGATIVE
Glucose, UA: NEGATIVE mg/dL
Hgb urine dipstick: NEGATIVE
Ketones, ur: NEGATIVE mg/dL
Leukocytes, UA: NEGATIVE
Nitrite: NEGATIVE
Protein, ur: NEGATIVE mg/dL
Specific Gravity, Urine: 1.017 (ref 1.005–1.030)
Urobilinogen, UA: 0.2 mg/dL (ref 0.0–1.0)
pH: 5.5 (ref 5.0–8.0)

## 2013-10-01 LAB — GLUCOSE, CAPILLARY
Glucose-Capillary: 96 mg/dL (ref 70–99)
Glucose-Capillary: 108 mg/dL — ABNORMAL HIGH (ref 70–99)
Glucose-Capillary: 94 mg/dL (ref 70–99)
Glucose-Capillary: 92 mg/dL (ref 70–99)

## 2013-10-01 NOTE — Progress Notes (Signed)
Physical Therapy Session Note  Patient Details  Name: Austin [name] MRN: [id number] Date of Birth: Dec 05, 1932  Today's Date: 10/01/2013 PT Individual Time: 1000-1100 and [id number]  PT Individual Time Calculation (min): 60 min and 13 min  Short Term Goals: Week 4:  PT Short Term Goal 1 (Week 4): Patient will perform bed mobility with modA x1. PT Short Term Goal 2 (Week 4): Patient will perform functional transfers with modA x1. PT Short Term Goal 3 (Week 4): Patient will perform wheelchair mobility 62' x1 with minA.  Skilled Therapeutic Interventions/Progress Updates:    AM Session: Patient received sitting in wheelchair, caregiver, Elon Jester, present for education/training session. Family education/training session for bed<>wheelchair squat pivot transfers with Stacy. Demonstration wheelchair<>bed performed first for observation. Extensive discussion/education about set up of tone/pusher tendencies, transfers, safety, technique, sequencing, body mechanics, and cues for wheelchair<>bed squat pivot transfers via Bobath technique. Shirly performed x4 bed<>wheelchair transfers and sit<>supine x2. Elon Jester performs all transfers safely and with proper techniques that best assist patient. Patient left sitting in standard wheelchair with seatbelt donned and all needs within reach, Roper present.   Per wife, ramp will not be finished by discharge tomorrow so patient will require ambulance home. Discussion with wife about when ramp will be finished secondary to safety concerns with being able to assist patient exiting home in case of an emergency. Patient likely will discharge Friday instead of Thursday due to delayed responses from patient's wife in regards to discharge information.  PM Session: Patient received semi-reclined in bed, wife present at bedside. Patient asleep, but able to be aroused. Similar to yesterday's afternoon session, patient's wife requesting he remain in bed "to rest" because he had  just gotten in bed. Educated patient and wife on importance of therapy as this will be one of patient's final therapy sessions. Patient's wife agrees, but states patient is "very tired" and she "would prefer he rests".   Instead, utilized time to finalize discharge plans. As she states each encounter, patient's wife reports she "doesn't have any questions at this time." Discussed DME, HHPT for f/u therapy, OOB schedule recommendations, and recommendations to optimize safety with functional mobility. Patient and wife verbalize understanding of recommendations, but it remains unclear what recommendations they will implement upon discharge home. Patient left semi-reclined in bed with all needs within reach, bed alarm on, and wife present at bedside.  Therapy Documentation Precautions:  Precautions Precautions: Fall Precaution Comments: peg Required Braces or Orthoses: Cervical Brace (cervical brace d/c'd) Cervical Brace: Hard collar (cervical brace d/c'd) Other Brace/Splint: R wrist splint and L cock up splint Restrictions Weight Bearing Restrictions: Yes RUE Weight Bearing: Weight bearing as tolerated Other Position/Activity Restrictions: WBAT on Rt UE per ORTHO note General: PT Amount of Missed Time (min): 32 Minutes PT Missed Treatment Reason: Patient fatigue Pain: Pain Assessment Pain Assessment: No/denies pain  See FIM for current functional status  Therapy/Group: Individual Therapy  Lillia Abed. Dai Apel, PT, DPT 10/01/2013, 4:32 PM

## 2013-10-01 NOTE — Treatment Plan (Signed)
Per Education officer, museum, plan of care has changed from ambulance transport home (secondary to ramp installation not yet complete and 5 STE home, which patient is unable to perform) to discharge home via car with family friend, Sherle Poe. After discussion with patient's wife, apparent plan is for patient to be transported home from hospital via car and that "two men will pick him [patient] up and carry him inside." This therapist made patient and wife aware that this is not recommended by treatment team secondary to safety concerns for patient as well as those lifting/carrying him and that ambulance transport would be safest option. Patient's wife states her confidence in their arrangement and therefore, plan will remain for patient to discharge home via car and people carrying him inside the house, however this is not the recommendation of the treatment team.  Teena Irani. Juanda Luba, PT, DPT
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
## 2013-10-01 NOTE — Progress Notes (Signed)
Recreational Therapy Discharge Summary Patient Details  Name: Austin [name] MRN: [id number] Date of Birth: May 19, 1932 Today's Date: 10/01/2013  Long term goals set: 1  Long term goals met: 1  Comments on progress toward goals: Pt met min assist level/min cues for simple table top activities with focus on attention, problem solving, simple mat & scanning.  [name] scheduled for discharge home with family on 10/03/13 to provide/coordinate 24 hour supervision/assist.  Reasons for discharge: discharge from hospital   Patient/family agrees with progress made and goals achieved: Yes  Merisa Julio 10/01/2013, 5:22 PM

## 2013-10-01 NOTE — Progress Notes (Signed)
RN went over with pt's wife about teaching how to give water flush by tube feeding,RN reinforce the need to assess for residual first and how to assess pt. For signs and symptoms of intolerance and discomfort during the flushes,also RN showed to pt's wife how to give medications through the tube.Keep assessing pt's wife for learning opportunities and reinforcements.

## 2013-10-01 NOTE — Progress Notes (Signed)
Speech Language Pathology Daily Session Note  Patient Details  Name: Austin [name] MRN: [id number] Date of Birth: [birth year]  Today's Date: 10/01/2013 SLP Individual Time: 1100-1200 SLP Individual Time Calculation (min): 60 min  Short Term Goals: Week 4: SLP Short Term Goal 1 (Week 4): Pt will utilize swallowing compensatory strategies with trials of puree textures and honey thick liquids to minimize overt s/s of aspiration with Min A multimodal cues. SLP Short Term Goal 2 (Week 4): Pt will utilize an increased vocal intensity at the word level with Min A multimodal cues.  SLP Short Term Goal 3 (Week 4): Pt will utilize external memory aids to recall new, daily information with Mod I multimodal cues.  SLP Short Term Goal 4 (Week 4): Pt will identify 2 cognitive and 2 physical deficit with Supervision multimodal cues.  SLP Short Term Goal 5 (Week 4): Pt will initiate functional tasks with Min A multimodal cues.  SLP Short Term Goal 6 (Week 4): Pt will demonstrate sustained attention to functional tasks for 20 minutes with Min A multimodal cues.   Skilled Therapeutic Interventions:  Skilled treatment session focused on education with the patient's caregiver, Austin [name]. SLP facilitated session by providing verbal explanation and demonstration in regards to patient's current diet recommendations, appropriate textures, how to appropriately thicken liquids, swallowing compensatory strategies and appropriate cueing during meals to increase utilization of strategies and minimize overt s/s of aspiration. She verbalized understanding and returned demonstration with lunch meal of Dys. 1 textures with honey-thick liquids. Patient's caregiver was also educated on strategies to utilize at home to increase attention, initiation, carryover, safety and overall cognitive function at home such as utilizing a routine and 24 hour supervision. She verbalized understanding and all education is complete at this time in  regards to family members and caregivers that will be participating in the patient's care at discharge.    FIM:  Comprehension Comprehension Mode: Auditory Comprehension: 5-Understands complex 90% of the time/Cues < 10% of the time Expression Expression Mode: Verbal Expression: 4-Expresses basic 75 - 89% of the time/requires cueing 10 - 24% of the time. Needs helper to occlude trach/needs to repeat words. Social Interaction Social Interaction: 4-Interacts appropriately 75 - 89% of the time - Needs redirection for appropriate language or to initiate interaction. Problem Solving Problem Solving: 3-Solves basic 50 - 74% of the time/requires cueing 25 - 49% of the time Memory Memory: 3-Recognizes or recalls 50 - 74% of the time/requires cueing 25 - 49% of the time FIM - Eating Eating Activity: 5: Set-up assist for open containers;5: Supervision/cues  Pain Pain Assessment Pain Assessment: No/denies pain Pain Score: 0-No pain  Therapy/Group: Individual Therapy  Austin [name], [place] 10/01/2013, 2:31 PM
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
## 2013-10-01 NOTE — Progress Notes (Addendum)
Patients wife in room.  Asked wife about discharge plans, she states she plans to take him home instead of SNF and that she has family/ friends that know how to provide TF.  Informed patient of the need to start learning how to provide care, regarding TF, possible bowel program, etc.  I provided teaching for TF to the wife by explaining what I was doing and why.  I encouraged patients wife to become part of the process in the future so she is not overwhelmed with teaching.  Brita Romp, RN

## 2013-10-01 NOTE — Progress Notes (Signed)
Social Work Patient ID: Austin [name], male   DOB: February 18, 1932, 80 y.o.   MRN: [id number]  Met with pt's wife this afternoon to review readiness for d/c tomorrow.  Both private duty caregivers have completed education with tx, however, wife has remained fairly "hands off".  Therapies still determining DME needs and team feels another day of education with wife and confirmation that all needs at home are arranged would be best.  Have alerted MD of request of team to change d/c date to 9/11.  Wife in favor of this.  Almira Coaster, on unit and reports he and one of the caregivers will assist in providing d/c transportation.  Team remains concerned that wife is not capable of providing primary care to pt and she has only arranged caregivers in the home 8a-8p.  Discussed with wife and she is agreeable to extend hours into the evening if they feel it is warranted.    Macrae Wiegman, LCSW

## 2013-10-01 NOTE — Progress Notes (Signed)
Occupational Therapy Session Note  Patient Details  Name: BRETT ILLES MRN: [id number] Date of Birth: 01/10/1933  Today's Date: 10/01/2013 OT Individual Time: 0900-1000 and 1326-1411 OT Individual Time Calculation (min): 60 min and 45 min    Short Term Goals: Week 4:  OT Short Term Goal 1 (Week 4): Focus on LTGs and family education/training  Skilled Therapeutic Interventions/Progress Updates:    Session 1: Pt seen for ADL retraining with focus on caregiver training/education, functional transfers, postural control in sitting and standing, and activity tolerance. Extensive discussion with caregiver Enid Derry) in regard to extensor tone management, pusher tendencies, body mechanics, safety concerns, use of bobath technique for transfers, types of cues, cognitive impairments, and CLOF. Therapist completed squat pivot transfer using Bobath technique bed<>w/c, bed>BSC, and BSC>w/c. Caregiver returned demonstration requiring +2 assist from therapist (min A) 2x when transferring to right. Caregiver demonstrated appropriate cues and good body mechanics during transfer to ensure safety of her and the patient. Completed toilet task with +2 assist (1 person to manage hygiene and clothing while the other person assisted with standing). Discussed wife assisting with hygiene and clothing management while caregiver provided physical support. Enid Derry reported agreeing with +2 recommendation at times, especially for toilet transfer. Complete dressing at sink with Enid Derry completing sit<>stand with pt 2x and min cues provided for positioning. Enid Derry completed all transfers and sit<>stand during self-care task with proper techniques and safety for her and the pt. Educated and practiced PROM and donning resting hand splint with shirley returning demonstration. Enid Derry reported no questions or concerns at this time. Pt left sitting in w/c with all needs in reach and caregiver present.   Session 2: Pt seen for 1:1  OT session with focus on family training/education, functional transfers, bed mobility, and contracture management. Pt received sitting in w/c with wife present. Discussed d/c plans with caregiver hours and wife verbalizing caregivers would be present 8a-8p.  Educated wife on donning/doffing resting hand splint and wearing schedule and she returned demonstration. Therapist assist pt to bed with max assist squat pivot transfer. Discussion with wife in regards providing care at night if/when pt has incontinent episode. Provided education on wearing briefs at home d/t incontinence and urgency, as wife unaware pt would continue to require briefs upon. Provided education and demonstration for rolling L and R to complete hygiene and don new brief. Wife assisted for approx 75% of task. Educated wife on positioning of LUE in bed with pillows and provided demonstration. Pt's wife with no questions or concerns at this time. Provided retrograde massage and PROM for edema and contracture management to LUE. Pt left supine in bed asleep with all needs in reach.    Therapy Documentation Precautions:  Precautions Precautions: Fall Precaution Comments: peg Required Braces or Orthoses: Cervical Brace (cervical brace d/c'd) Cervical Brace: Hard collar (cervical brace d/c'd) Other Brace/Splint: R wrist splint and L cock up splint Restrictions Weight Bearing Restrictions: Yes RUE Weight Bearing: Weight bearing as tolerated LUE Weight Bearing: Weight bearing as tolerated RLE Weight Bearing: Weight bearing as tolerated LLE Weight Bearing: Non weight bearing Other Position/Activity Restrictions: WBAT on Rt UE per ORTHO note General:   Vital Signs: Therapy Vitals Temp: 98.9 F (37.2 C) Temp src: Oral Pulse Rate: 67 Resp: 18 BP: 131/67 mmHg Patient Position (if appropriate): Lying Oxygen Therapy SpO2: 100 % O2 Device: None (Room air) Pain: Pain Assessment Pain Assessment: No/denies pain  See FIM for  current functional status  Therapy/Group: Individual Therapy  Duayne Cal 10/01/2013,  3:03 PM

## 2013-10-02 ENCOUNTER — Inpatient Hospital Stay (HOSPITAL_COMMUNITY): Payer: Medicare Other | Admitting: Occupational Therapy

## 2013-10-02 ENCOUNTER — Inpatient Hospital Stay (HOSPITAL_COMMUNITY): Payer: Medicare Other

## 2013-10-02 ENCOUNTER — Inpatient Hospital Stay (HOSPITAL_COMMUNITY): Payer: Medicare Other | Admitting: Speech Pathology

## 2013-10-02 ENCOUNTER — Encounter (HOSPITAL_COMMUNITY): Payer: Medicare Other

## 2013-10-02 LAB — GLUCOSE, CAPILLARY
Glucose-Capillary: 89 mg/dL (ref 70–99)
Glucose-Capillary: 100 mg/dL — ABNORMAL HIGH (ref 70–99)
Glucose-Capillary: 97 mg/dL (ref 70–99)
Glucose-Capillary: 97 mg/dL (ref 70–99)

## 2013-10-02 LAB — URINE CULTURE
Colony Count: NO GROWTH
Culture: NO GROWTH

## 2013-10-02 MED ORDER — AMANTADINE HCL 100 MG PO CAPS
100.0000 mg | ORAL_CAPSULE | Freq: Two times a day (BID) | ORAL | Status: DC
  Administered 2013-10-02 – 2013-10-03 (×3): 100 mg via ORAL
  Filled 2013-10-02 (×5): qty 1

## 2013-10-02 MED ORDER — METOPROLOL TARTRATE 12.5 MG HALF TABLET: ORAL_TABLET | ORAL | Status: DC

## 2013-10-02 MED ORDER — CHOLESTYRAMINE LIGHT 4 G PO PACK
4.0000 g | PACK | Freq: Every day | ORAL | Status: DC
  Filled 2013-10-02: qty 1

## 2013-10-02 MED ORDER — CHLORHEXIDINE GLUCONATE 0.12 % MT SOLN: 15.0000 mL | Freq: Two times a day (BID) | OROMUCOSAL | Status: DC

## 2013-10-02 MED ORDER — OMEPRAZOLE 40 MG PO CPDR: DELAYED_RELEASE_CAPSULE | ORAL | Status: DC

## 2013-10-02 MED ORDER — CHOLESTYRAMINE LIGHT 4 G PO PACK: PACK | ORAL | Status: DC

## 2013-10-02 MED ORDER — AMANTADINE HCL 100 MG PO CAPS: 100.0000 mg | ORAL_CAPSULE | Freq: Two times a day (BID) | ORAL | Status: DC

## 2013-10-02 MED ORDER — CETYLPYRIDINIUM CHLORIDE 0.05 % MT LIQD: 7.0000 mL | Freq: Four times a day (QID) | OROMUCOSAL | Status: DC

## 2013-10-02 MED ORDER — METOPROLOL TARTRATE 12.5 MG HALF TABLET
12.5000 mg | ORAL_TABLET | Freq: Two times a day (BID) | ORAL | Status: DC
  Administered 2013-10-02 – 2013-10-03 (×2): 12.5 mg via ORAL
  Filled 2013-10-02 (×5): qty 1

## 2013-10-02 MED ORDER — ACETAMINOPHEN 325 MG PO TABS: 325.0000 mg | ORAL_TABLET | ORAL | Status: DC | PRN

## 2013-10-02 MED ORDER — STARCH (THICKENING) PO POWD: 1.0000 g | ORAL | Status: DC | PRN

## 2013-10-02 MED ORDER — PANTOPRAZOLE SODIUM 40 MG PO PACK: 40.0000 mg | PACK | Freq: Every day | ORAL | Status: DC

## 2013-10-02 MED ORDER — METOPROLOL TARTRATE 50 MG PO TABS
ORAL_TABLET | ORAL | Status: DC
Start: 2013-10-02 — End: 2013-10-02

## 2013-10-02 MED ORDER — FREE WATER: 200.0000 mL | Freq: Every day | Status: DC

## 2013-10-02 MED ORDER — BACLOFEN 10 MG PO TABS: ORAL_TABLET | ORAL | Status: DC

## 2013-10-02 MED ORDER — TIZANIDINE HCL 4 MG PO TABS: 4.0000 mg | ORAL_TABLET | Freq: Three times a day (TID) | ORAL | Status: DC

## 2013-10-02 MED ORDER — ADULT MULTIVITAMIN LIQUID CH: 5.0000 mL | Freq: Every day | ORAL | Status: DC

## 2013-10-02 NOTE — Progress Notes (Signed)
Occupational Therapy Discharge Summary  Patient Details  Name: Austin [name] MRN: [id number] Date of Birth: 01/29/1932  Today's Date: 10/02/2013 OT Individual Time: 0730-0830 OT Individual Time Calculation (min): 60 min    Patient has met 11 of 14 long term goals due to improved activity tolerance, improved balance, postural control, ability to compensate for deficits, functional use of  RIGHT upper, RIGHT lower and LEFT lower extremity, improved attention, improved awareness and improved coordination.  Patient to discharge at overall total-mod level for functional transfers and self-care tasks.  Patient's caregivers is independent to provide the necessary physical and cognitive assistance at discharge. Patient will have hired caregivers at home from [place]. Therapy team recommended and discussed with wife multiple times about having 24 hour caregivers initially and have both present (especially in the evening). Caregivers have participated in hands on training and demonstrate the ability to safely provide the assistance the patient requires at discharge. Both caregivers reporting they understand that patient needs +2 assistance with toilet task (one for standing and another for hygiene/clothing management) and wife agreeable to assist with hygiene/clothing management. Discussed completing sponge bathing only at this time due to poor postural control, cognition, balance, etc and patient and caregivers agreeable. Wife participated in hands on training 1x during OT session with providing hygiene and changing brief while supine in bed to prepare for times when caregivers are not present. Wife assisted approx 75% of task. Patient and caregivers with no questions or concerns at this time and continue to verbalize understanding of therapy team's recommendations.   Reasons goals not met: Patient did not meet standing balance, lower body dressing, and attention goals. Patient requires max assist for standing  balance due to poor midline orientation, poor balance strategies, decreased awareness, decreased attention, decreased functional use of LLE/LUE, and pusher tendencies. Patient did not meet LB dressing goals secondary to cognitive and physical deficits. Patient did not meet selective attention goal secondary to requiring mod cues.   Recommendation:  Patient will benefit from ongoing skilled OT services in home health setting to continue to advance functional skills in the area of BADL, Reduce care partner burden and awareness, balance, strength, and minimize fall risk.  Equipment: hospital bed, drop arm BSC, left half lap tray  Reasons for discharge: treatment goals met and discharge from hospital  Patient/family agrees with progress made and goals achieved: Yes  Skilled Therapeutic Intervention Pt seen for ADL retraining with focus on functional transfers, standing balance, initiation, attention, and postural control. Pt received supine in bed. Provided PROM to LUE and LLE. Transferred to rolling shower chair via squat pivot with max assist and mod cues for sustaining anterior weight shift. Pt initiated bathing 6/10 body parts and sustained attention to task approx 2 min. Completed dressing from w/c with max assist sit<>stand x3 and increased pushing noted this AM. Pt with limited attention to dressing up to 15 seconds. Pt recalled steps for hemi dressing with min cues. At end of session, pt left sitting in w/c with LUE positioned on lap tray and QRB donned.   OT Discharge Precautions/Restrictions  Precautions Precautions: Fall Precaution Comments: peg Restrictions Weight Bearing Restrictions: Yes RUE Weight Bearing: Weight bearing as tolerated General   Vital Signs Therapy Vitals Temp: 98 F (36.7 C) Temp src: Oral Pulse Rate: 87 Resp: 18 BP: 139/66 mmHg Patient Position (if appropriate): Lying Oxygen Therapy SpO2: 95 % O2 Device: None (Room air) Pain Pain Assessment Pain  Assessment: No/denies pain ADL   Vision/Perception  Vision-  History Baseline Vision/History: Cataracts Patient Visual Report: No change from baseline Vision- Assessment Vision Assessment?: Vision impaired- to be further tested in functional context;Yes Eye Alignment: Within Functional Limits Ocular Range of Motion: Other (comment) (difficult to formally assess d/t impaired cognition) Alignment/Gaze Preference: Within Defined Limits Tracking/Visual Pursuits: Decreased smoothness of eye movement to LEFT inferior field;Decreased smoothness of eye movement to LEFT superior field;Requires cues, head turns, or add eye shifts to track;Decreased smoothness of vertical tracking Saccades: Additional eye shifts occurred during testing Convergence: Impaired - to be further tested in functional context Additional Comments: difficult to fully assess d/t impaired cognition Perception Comments: decreased attention to LUE/LLE  Cognition Overall Cognitive Status: Impaired/Different from baseline Arousal/Alertness: Awake/alert Orientation Level: Oriented X4 Attention: Sustained Focused Attention: Appears intact Focused Attention Impairment: Functional basic Sustained Attention: Impaired Sustained Attention Impairment: Functional basic Awareness: Impaired Awareness Impairment: Intellectual impairment Safety/Judgment: Impaired [employer] Scales of Cognitive Functioning: Confused/appropriate Sensation Sensation Light Touch: Appears Intact Light Touch Impaired Details: Impaired LLE;Impaired LUE (inconsistent during assessment) Proprioception: Impaired by gross assessment Coordination Gross Motor Movements are Fluid and Coordinated: No Fine Motor Movements are Fluid and Coordinated: No Motor  Motor Motor: Hemiplegia;Abnormal tone;Abnormal postural alignment and control;Motor impersistence Mobility  Bed Mobility Bed Mobility: Rolling Right;Rolling Left;Supine to Sit Rolling Right: 4: Min  assist Rolling Right Details: Verbal cues for technique;Tactile cues for initiation;Tactile cues for weight bearing Rolling Left: 5: Supervision Rolling Left Details: Visual cues/gestures for sequencing;Verbal cues for sequencing;Manual facilitation for weight shifting;Verbal cues for technique Supine to Sit: 3: Mod assist Supine to Sit Details: Verbal cues for sequencing;Manual facilitation for weight shifting;Visual cues/gestures for sequencing;Verbal cues for technique Supine to Sit Details (indicate cue type and reason): assist needed with left LE and for trunk Transfers Sit to Stand: 2: Max assist Sit to Stand Details: Verbal cues for sequencing;Manual facilitation for weight shifting;Visual cues/gestures for sequencing;Verbal cues for technique;Manual facilitation for placement Stand to Sit: 2: Max assist Stand to Sit Details (indicate cue type and reason): Visual cues/gestures for sequencing;Verbal cues for sequencing;Manual facilitation for weight shifting;Verbal cues for technique;Manual facilitation for placement  Trunk/Postural Assessment  Cervical Assessment Cervical Assessment: Within Functional Limits Thoracic Assessment Thoracic Assessment: Within Functional Limits Lumbar Assessment Lumbar Assessment: Within Functional Limits  Balance   Extremity/Trunk Assessment RUE Assessment RUE Assessment: Within Functional Limits LUE Assessment LUE Assessment: Exceptions to [facility] (flaccid) LUE AROM (degrees) Overall AROM Left Upper Extremity: Deficits LUE PROM (degrees) LUE Overall PROM Comments: shoulder subluxation noted (using GivMohr sling in therapy); shoulder flexion 90 degrees; shoulder abduction 30-40*, full elbow flextion and extension,  wrist extension grossly 35 degrees (resting hand splint at night), full wrist and digit flexion LUE Tone LUE Tone Comments: tone varies however Modified [place] 3-4 overall  See FIM for current functional status  Rhyse Loux  N 10/02/2013, 8:22 AM

## 2013-10-02 NOTE — Progress Notes (Signed)
Speech Language Pathology Discharge Summary  Patient Details  Name: Austin [name] MRN: [id number] Date of Birth: [birth year]  Today's Date: 10/02/2013 SLP Individual Time: 1500-1530 SLP Individual Time Calculation (min): 30 min   Skilled Therapeutic Interventions:  Skilled treatment session focused on completion of family education with the patient's wife. The patient's wife asked appropriate questions in regards to websites/stores she can purchase pre-thickened liquids, etc. This clinician utilized the Internet to show the patient's wife a variety of websites to find desired items and also ordered "Magic Cups" from the hospital kitchen for the patient's wife to purchase and take home.  The patient's wife took appropriate notes and all questions/needs were addressed at this time. Patient will discharge home tomorrow with 24 hour supervision.   Patient has met 8 of 8 long term goals.  Patient to discharge at overall Mod level.   Reasons goals not met: N/A   Clinical Impression/Discharge Summary: Patient has made excellent gains and has met 8 of 8 LTG's this admission due to improved swallowing function, verbal expression, auditory comprehension, attention, initiation, problem solving, working memory, awareness and safety. Currently, pt is consuming Dys. 1 textures with honey-thick liquids with minimal overt s/s of aspiration but requires Mod A multimodal cues for utilization of swallowing strategies of multiple swallows and intermittent throat clear. Patient continues to demonstrate a hoarse vocal quality with low vocal intensity that decreases his overall intelligibility at the phrase level and requires overall Min A for use of an increased vocal intensity.  The patient also requires overall Mod A multimodal cues for cognitive function and ability to complete basic and familiar tasks. Patient/family/caregiver education has been completed and patient will discharge home with 24 hour supervision.   Patient would benefit from f/u home health SLP services to maximize his cognitive-linguistic and swallowing function and overall functional independence.    Care Partner:  Caregiver Able to Provide Assistance: Yes  Type of Caregiver Assistance: Physical;Cognitive  Recommendation:  24 hour supervision/assistance;Home Health SLP  Rationale for SLP Follow Up: Maximize functional communication;Maximize cognitive function and independence;Reduce caregiver burden;Maximize swallowing safety   Equipment: Thickener, suction    Reasons for discharge: Treatment goals met;Discharged from hospital   Patient/Family Agrees with Progress Made and Goals Achieved: Yes   See FIM for current functional status  [name] 10/02/2013, 4:06 PM
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
## 2013-10-02 NOTE — Progress Notes (Signed)
Physical Therapy Session Note  Patient Details  Name: Austin [name] MRN: [id number] Date of Birth: 07-07-1932  Today's Date: 10/02/2013 PT Individual Time: 0900-1000 PT Individual Time Calculation (min): 60 min   Short Term Goals: Week 4:  PT Short Term Goal 1 (Week 4): Patient will perform bed mobility with modA x1. PT Short Term Goal 2 (Week 4): Patient will perform functional transfers with modA x1. PT Short Term Goal 3 (Week 4): Patient will perform wheelchair mobility 28' x1 with minA.  Skilled Therapeutic Interventions/Progress Updates:  Patient eating breakfast upon entering room. Patient working on attention to precautions with eating and required cueing about 25% of the time. Patient propelled wheelchair with right extremities and moderate verbal cueing to avoid objects on the left and occasional min assist to redirect wheelchair to the right. Patient performed stand pivot transfer wheelchair <> bed with max assist due to tendency to push and apraxia. Patient maintained dynamic sitting balance with min assist. Patient sit to supine with assist for left LE only. Patient rolled to left with verbal cueing only. Patient min assist to roll to right. Patient mod assist with left LE and some lifting at trunk for right sidelying to sit. Patient performed car transfer with max assist stand pivot. Patient with decreased trunk balance once in car with posterior lean. Recommend using towel on seat to assist with adjusting patient once in the car. Discussed goals with patient and he agrees with progress made towards goals. Patient left in wheelchair with quick release belt in place and all items in reach.  Therapy Documentation Precautions:  Precautions Precautions: Fall Precaution Comments: peg Required Braces or Orthoses: Cervical Brace (cervical brace d/c'd) Cervical Brace: Hard collar (cervical brace d/c'd) Other Brace/Splint: R wrist splint and L cock up splint Restrictions Weight  Bearing Restrictions: No RUE Weight Bearing: Weight bearing as tolerated LUE Weight Bearing: Weight bearing as tolerated RLE Weight Bearing: Weight bearing as tolerated LLE Weight Bearing: Non weight bearing Other Position/Activity Restrictions: WBAT on Rt UE per ORTHO note Pain: Pain Assessment Pain Assessment: No/denies pain Mobility: Bed Mobility Bed Mobility: Rolling Right;Rolling Left;Supine to Sit Rolling Right: 4: Min assist Rolling Right Details: Verbal cues for technique;Tactile cues for initiation;Tactile cues for weight bearing Rolling Left: 5: Supervision Supine to Sit: 3: Mod assist Supine to Sit Details (indicate cue type and reason): assist needed with left LE and for trunk Transfers Transfers: Yes Sit to Stand: 2: Max assist Stand to Sit: 2: Max Teacher, English as a foreign language Transfers: With armrests;With upper extremity assistance;2: Max assist Locomotion : Architect: Yes Wheelchair Assistance: 4: Energy manager: Right upper extremity;Right lower extremity Wheelchair Parts Management: Needs assistance Distance: 150   See FIM for current functional status  Therapy/Group: Individual Therapy  Elder Love M 10/02/2013, 11:15 AM

## 2013-10-02 NOTE — Progress Notes (Signed)
Occupational Therapy Session Note  Patient Details  Name: PERSHING BONNICI MRN: [id number] Date of Birth: 25-Nov-1932  Today's Date: 10/02/2013 OT Individual Time: 1300-1400 OT Individual Time Calculation (min): 60 min    Skilled Therapeutic Interventions/Progress Updates:    Pt worked on dynamic sitting and transitional movements sit to stand and stand to squat during session.  Emphasis on activation of the LLE to assist with standing and for transitional movements while attempting to decrease pushing to the left with the RLE.  Pt with increased motor planing noted with attempted stand to squat as he is unable to coordinate eccentric movement in the RLE to allow for smooth transition to sitting.  Mr. Zimmerli also continues to exhibit decreased forward trunk flexion when attempting to stand, scoot, or transfer.  Set up activity to promote reaching forward for increased anterior weightshift as well as to the right to help influence activation of the LLE as well as attempting to limit over pushing with the RLE.  Continually, he attempts to slide his RLE out to the right to help with balance but ends up pushing himself over to the left.  Pt needed max facilitation for all sit to stand transitions as well as for static standing.  He maintains decreased trunk and pelvic extension in standing as well. Pt with improved sitting balance from previous sessions that this therapist had worked with him.    Therapy Documentation Precautions:  Precautions Precautions: Fall Precaution Comments: peg Required Braces or Orthoses: Cervical Brace (cervical brace d/c'd) Cervical Brace: Hard collar (cervical brace d/c'd) Other Brace/Splint: R wrist splint and L cock up splint Restrictions Weight Bearing Restrictions: Yes RUE Weight Bearing: Weight bearing as tolerated LUE Weight Bearing: Weight bearing as tolerated RLE Weight Bearing: Weight bearing as tolerated LLE Weight Bearing: Weight bearing as  tolerated Other Position/Activity Restrictions: WBAT on Rt UE per ORTHO note  Pain: Pain Assessment Pain Assessment: Faces Faces Pain Scale: Hurts a little bit Pain Type: Acute pain Pain Location: Arm Pain Orientation: Left Pain Intervention(s): Repositioned ADL: See FIM for current functional status  Therapy/Group: Individual Therapy  Cattleya Dobratz,Jaydee OTR/L 10/02/2013, 3:12 PM

## 2013-10-02 NOTE — Progress Notes (Addendum)
Physical Therapy Session Note  Patient Details  Name: Austin [name] MRN: [id number] Date of Birth: 08-Jul-1932  Today's Date: 10/02/2013 PT Individual Time: 1003-1103 PT Individual Time Calculation (min): 60 min   Short Term Goals: Week 3:  PT Short Term Goal 1 (Week 3): Patient will perform bed mobility with maxA x1. PT Short Term Goal 1 - Progress (Week 3): Met PT Short Term Goal 2 (Week 3): Patient will perform unsupported static sitting balance with supervision/verbal cues. PT Short Term Goal 2 - Progress (Week 3): Met PT Short Term Goal 3 (Week 3): Patient will perform gait training 76' with +2 assist. PT Short Term Goal 3 - Progress (Week 3): Progressing toward goal  Skilled Therapeutic Interventions/Progress Updates:   Wife did not attend session.  CMSW had not requested her attendance. Plan for pt to have PT tomorrow, 30 min, in afternoon, when caregiver can participate in simulated car transfer.  Use of towel under pt as he slides in/out of car is recommended.  W/c> mat to R squat pivot with max assist. Sit> stand, with RUE pulling up on sturdy chair in front of him, min assist.  neuromuscular re-education via demo, manual cues, VCs for - trunk shortening/lengthening/rotating and trunk and head righting in supported sitting> unsupported sitting -balance reactions of L trunk when pt reaching out of BOS to R , forward and downward -wt shifting in standing, working on L hip and knee stance stability -gait with [place]on Pier walker, max assist of 1 person for LLE control, another person to help steer walker, x 8'.    W/c propulsion in obstacle course of cones, focusing on L attention.  Pt bumped 4/4 cones.     Therapy Documentation Precautions:  Precautions Precautions: Fall Precaution Comments: peg Required Braces or Orthoses: Cervical Brace (cervical brace d/c'd) Cervical Brace: Hard collar (cervical brace d/c'd) Other Brace/Splint: R wrist splint and L cock up  splint Restrictions Weight Bearing Restrictions: No RUE Weight Bearing: Weight bearing as tolerated LUE Weight Bearing: Weight bearing as tolerated RLE Weight Bearing: Weight bearing as tolerated LLE Weight Bearing: Non weight bearing Other Position/Activity Restrictions: WBAT on Rt UE per ORTHO note   Pain: Pain Assessment Pain Assessment: No/denies pain Mobility: Bed Mobility Bed Mobility: Rolling Right;Rolling Left;Supine to Sit Rolling Right: 4: Min assist Rolling Right Details: Verbal cues for technique;Tactile cues for initiation;Tactile cues for weight bearing Rolling Left: 5: Supervision Locomotion : Ambulation 8' Ambulation/Gait Assistance: 2: Max assist;1: +2 Total assist  Trunk/Postural Assessment : Cervical Assessment Cervical Assessment: Within Functional Limits Thoracic Assessment Thoracic Assessment: Within Functional Limits Lumbar Assessment Lumbar Assessment: Within Functional Limits     See FIM for current functional status  Therapy/Group: Individual Therapy  Keegan Ducey 10/02/2013, 12:21 PM
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
## 2013-10-03 ENCOUNTER — Inpatient Hospital Stay (HOSPITAL_COMMUNITY): Payer: Medicare Other | Admitting: *Deleted

## 2013-10-03 DIAGNOSIS — S069X9A Unspecified intracranial injury with loss of consciousness of unspecified duration, initial encounter: Secondary | ICD-10-CM

## 2013-10-03 DIAGNOSIS — Z5189 Encounter for other specified aftercare: Secondary | ICD-10-CM

## 2013-10-03 DIAGNOSIS — S069XAA Unspecified intracranial injury with loss of consciousness status unknown, initial encounter: Secondary | ICD-10-CM

## 2013-10-03 LAB — GLUCOSE, CAPILLARY
Glucose-Capillary: 101 mg/dL — ABNORMAL HIGH (ref 70–99)
Glucose-Capillary: 92 mg/dL (ref 70–99)

## 2013-10-03 MED ORDER — BETHANECHOL CHLORIDE 10 MG PO TABS
10.0000 mg | ORAL_TABLET | Freq: Three times a day (TID) | ORAL | Status: DC
  Administered 2013-10-03: 10 mg via ORAL
  Filled 2013-10-03 (×4): qty 1

## 2013-10-03 MED ORDER — BETHANECHOL CHLORIDE 10 MG PO TABS: 10.0000 mg | ORAL_TABLET | Freq: Three times a day (TID) | ORAL | Status: DC

## 2013-10-03 NOTE — Progress Notes (Signed)
Physical Therapy Discharge Summary  Patient Details  Name: Austin [name] MRN: [id number] Date of Birth: [birth year]  Today's Date: 10/03/2013  Patient has met 9 of 14 long term goals due to improved activity tolerance, improved balance, improved postural control, increased strength, ability to compensate for deficits, functional use of  right upper extremity, right lower extremity, left upper extremity and left lower extremity, improved attention, improved awareness and improved coordination.  Patient to discharge at a wheelchair level Max Assist.   Patient's wife  requires assistance from hired caregivers to provide the necessary physical and cognitive assistance at discharge as she refuses to perform hands on care.  Reasons goals not met: Patient did not meet bed mobility, ambulation, stair negotiation, or attention goals secondary to poor progress and pusher tendencies.  Recommendation:  Patient will benefit from ongoing skilled PT services in home health setting to continue to advance safe functional mobility, address ongoing impairments in balance, strength, postural control, activity tolerance, overall functional mobility, and minimize fall risk.  Equipment: wheelchair with cushion and L half lap tray, hospital bed  Reasons for discharge: treatment goals met and discharge from hospital  Patient/family agrees with progress made and goals achieved: Yes  Please see PT treatment notes from 10/01/13-10/02/13 for CLOF and status for all functional mobility.  PT Discharge Precautions/Restrictions Precautions Precautions: Fall Precaution Comments: peg Restrictions Weight Bearing Restrictions: Yes RUE Weight Bearing: Weight bearing as tolerated LUE Weight Bearing: Weight bearing as tolerated RLE Weight Bearing: Weight bearing as tolerated LLE Weight Bearing: Weight bearing as tolerated Vision/Perception  Vision - Assessment Eye Alignment: Within Functional Limits   Cognition Overall Cognitive Status: Impaired/Different from baseline Arousal/Alertness: Awake/alert Orientation Level: Oriented X4 Attention: Sustained Focused Attention: Appears intact Focused Attention Impairment: Functional basic Sustained Attention: Impaired Sustained Attention Impairment: Functional basic Memory: Impaired Memory Impairment: Storage deficit;Decreased recall of new information;Retrieval deficit Decreased Short Term Memory: Verbal basic;Functional basic Awareness: Impaired Awareness Impairment: Intellectual impairment Problem Solving: Impaired Problem Solving Impairment: Verbal basic;Functional basic Behaviors: Perseveration Safety/Judgment: Impaired [employer] Scales of Cognitive Functioning: Confused/appropriate Sensation Sensation Light Touch: Appears Intact Proprioception: Impaired by gross assessment Coordination Gross Motor Movements are Fluid and Coordinated: No Fine Motor Movements are Fluid and Coordinated: No Motor  Motor Motor: Hemiplegia;Abnormal tone;Abnormal postural alignment and control;Motor impersistence Motor - Skilled Clinical Observations: clonus L foot; L UE spasticity Motor - Discharge Observations: clonus L foot; L UE spasticity  Mobility Bed Mobility Bed Mobility: Rolling Right;Rolling Left;Supine to Sit Rolling Right: 4: Min assist Rolling Right Details: Verbal cues for technique;Tactile cues for initiation;Tactile cues for weight bearing Rolling Left: 5: Supervision Rolling Left Details: Visual cues/gestures for sequencing;Verbal cues for sequencing;Manual facilitation for weight shifting;Verbal cues for technique Supine to Sit: 3: Mod assist Supine to Sit Details: Verbal cues for sequencing;Manual facilitation for weight shifting;Visual cues/gestures for sequencing;Verbal cues for technique Transfers Transfers: Yes Sit to Stand: 2: Max assist Sit to Stand Details: Verbal cues for sequencing;Manual facilitation for  weight shifting;Visual cues/gestures for sequencing;Verbal cues for technique;Manual facilitation for placement Stand to Sit: 2: Max assist Stand to Sit Details (indicate cue type and reason): Visual cues/gestures for sequencing;Verbal cues for sequencing;Manual facilitation for weight shifting;Verbal cues for technique;Manual facilitation for placement Squat Pivot Transfers: With armrests;With upper extremity assistance;2: Max Risk manager Details: Verbal cues for sequencing;Manual facilitation for weight shifting;Visual cues/gestures for sequencing;Verbal cues for technique;Manual facilitation for placement Locomotion  Ambulation Ambulation: No Gait Gait: No Stairs / Additional Locomotion Stairs: No Wheelchair Mobility  Wheelchair Mobility: Yes Wheelchair Assistance: 4: Advertising account executive Details: Visual cues/gestures for sequencing;Verbal cues for sequencing;Verbal cues for technique;Manual facilitation for placement;Verbal cues for precautions/safety;Verbal cues for safe use of DME/AE Wheelchair Propulsion: Right upper extremity;Right lower extremity Wheelchair Parts Management: Needs assistance Distance: 150  Trunk/Postural Assessment  Cervical Assessment Cervical Assessment: Within Functional Limits Thoracic Assessment Thoracic Assessment: Within Functional Limits Lumbar Assessment Lumbar Assessment: Within Functional Limits Postural Control Postural Control: Deficits on evaluation Righting Reactions: delayed Protective Responses: delayed Postural Limitations: posterior pelvic tilt; pusher syndrome like presentation, pushes L with R UE/LE  Balance   Extremity Assessment  RLE Assessment RLE Assessment: Within Functional Limits (poor motor planning/control, but strength WFL) LLE Assessment LLE Assessment: Exceptions to [facility] LLE Strength LLE Overall Strength: Deficits;Due to impaired cognition LLE Overall Strength Comments: L inattention; unable  to elicit any active movement in L LE, however could be due to inattention rather than strength LLE Tone LLE Tone: Modified [place];Hypertonic Hypertonic Details: clonus L LE Modified [place]worth Scale for Grading Hypertonia LLE: More marked increase in muscle tone through most of the ROM, but affected part(s) easily moved  See FIM for current functional status  Maleny Candy S Shakesha Soltau S. Therasa Lorenzi, PT, DPT 10/03/2013, 1:42 PM
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
## 2013-10-03 NOTE — Progress Notes (Signed)
Pt. Got d/c instructions,prescriptions and follow up appointments.Pt. Ready to go home with his family

## 2013-10-03 NOTE — Plan of Care (Signed)
Problem: RH Bed Mobility Goal: LTG Patient will perform bed mobility with assist (PT) LTG: Patient will perform bed mobility with assistance, with/without cues (PT).  Outcome: Not Met (add Reason) Patient continues to require maxA at times for bed mobility.  Problem: RH Ambulation Goal: LTG Patient will ambulate in controlled environment (PT) LTG: Patient will ambulate in a controlled environment, # of feet with assistance (PT).  Outcome: Not Met (add Reason) Ambulation unsafe at this time secondary to patient's postural control and pusher tendencies. Goal: LTG Patient will ambulate in home environment (PT) LTG: Patient will ambulate in home environment, # of feet with assistance (PT).  Outcome: Not Met (add Reason) Ambulation unsafe at this time secondary to patient's postural control and pusher tendencies.  Problem: RH Wheelchair Mobility Goal: LTG Patient will propel w/c in controlled environment (PT) LTG: Patient will propel wheelchair in controlled environment, # of feet with assist (PT)  Outcome: Completed/Met Date Met:  10/03/13 Patient has met inconsistently. Goal: LTG Patient will propel w/c in home environment (PT) LTG: Patient will propel wheelchair in home environment, # of feet with assistance (PT).  Outcome: Completed/Met Date Met:  10/03/13 Patient has met inconsistently.  Problem: RH Stairs Goal: LTG Patient will ambulate up and down stairs w/assist (PT) LTG: Patient will ambulate up and down # of stairs with assistance (PT)  Outcome: Not Met (add Reason) Stair negotiatoin unsafe at this time secondary to patient's postural control and pusher tendencies.  Problem: RH Attention Goal: LTG Patient will demonstrate focused/sustained (PT) LTG: Patient will demonstrate focused/sustained/selective/alternating/divided attention during functional mobility in specific environment with assist for # of minutes (PT)  Outcome: Not Met (add Reason) Patient continues to require  mod-max cues for sustained attention.

## 2013-10-03 NOTE — Progress Notes (Signed)
Social Work Discharge Note  The overall goal for the admission was met for:   Discharge location: Yes - home with wife and private duty caregivers providing 24/7 assistance  Length of Stay: Yes - 30 days  Discharge activity level: Yes - max assist to total overall  Home/community participation: Yes  Services provided included: MD, RD, PT, OT, SLP, RN, TR, Pharmacy, [place]: Medicare and Private Insurance: [place]  Follow-up services arranged: Home Health: Therapist, sports, PT, OT, ST, SW all via [place], DME: 18x18 lightweight w/c with left 1/2 lap tray, cushion, semi electric hospital bed and drop arm commode all via [place], Other: provided information on available neuropsychology services and Patient/Family has no preference for HH/DME agencies  Comments (or additional information):  Patient/Family verbalized understanding of follow-up arrangements: Yes  Individual responsible for coordination of the follow-up plan: pt/ wife  Confirmed correct DME delivered: Lennart Pall 10/03/2013    Lynann Demetrius

## 2013-10-03 NOTE — Discharge Instructions (Signed)
Inpatient Rehab Discharge Instructions  Austin [name] Discharge date and time: 10/02/13   Activities/Precautions/ Functional Status: Activity: no lifting, driving, or strenuous exercise till cleared by MD.  Diet: Pureed foods.  Thicken liquids to honey consistency.  Wound Care: Wash around PEG site with soap and water. Pat dry. Contact Dr. Hulen Skains if you develop any redness, purulent/bloody drainage, fever or chills.   Functional status:  ___ No restrictions     ___ Walk up steps independently _X__ 24/7 supervision/assistance   ___ Walk up steps with assistance ___ Intermittent supervision/assistance  ___ Bathe/dress independently ___ Walk with walker     ___ Bathe/dress with assistance ___ Walk Independently    ___ Shower independently ___ Walk with assistance    ___ Shower with assistance _X__ No alcohol     ___ Return to work/school ________    COMMUNITY REFERRALS UPON DISCHARGE:    Home Health:   PT     OT     ST   RN    SW                   Agency: [facility] Phone: [phone]   Medical Equipment/Items Ordered: hospital bed, wheelchair, drop arm commode                                                     Agency/Supplier:  [facility] @ [phone]   GENERAL COMMUNITY RESOURCES FOR PATIENT/FAMILY:  Support Groups: Brain Injury Groups (handout provided)  Caregiver Support: same   Mental Health:  Neuropsychology Follow up options                            Dr. Marlane Hatcher @ [phone]  [facility])                            Dr. Ilean Skill @ [phone] [facility])       Special Instructions: 1. All liquids need to be honey thick consistency. Full supervision at meals--small bites/sips and needs to swallow twice. Clear throat and then repeat swallow before another bite of food. Limit distractions during meals.  2. Use filtered water for 200 cc flushes five times a day via PEG tube.  3.  Toilet patient every 4 hours while awake to help with  continence.  4.  All medication are administered via PEG tube--open capsule and crush pills--mix with small amount of tube feed or water and administer via tube.   My questions have been answered and I understand these instructions. I will adhere to these goals and the provided educational materials after my discharge from the hospital.  Patient/Caregiver Signature _______________________________ Date __________  Clinician Signature _______________________________________ Date __________  Please bring this form and your medication list with you to all your follow-up doctor's appointments.

## 2013-10-05 DIAGNOSIS — I10 Essential (primary) hypertension: Secondary | ICD-10-CM | POA: Diagnosis not present

## 2013-10-05 DIAGNOSIS — S069X9S Unspecified intracranial injury with loss of consciousness of unspecified duration, sequela: Secondary | ICD-10-CM | POA: Diagnosis not present

## 2013-10-05 DIAGNOSIS — Z5181 Encounter for therapeutic drug level monitoring: Secondary | ICD-10-CM | POA: Diagnosis not present

## 2013-10-05 DIAGNOSIS — Z7901 Long term (current) use of anticoagulants: Secondary | ICD-10-CM | POA: Diagnosis not present

## 2013-10-05 DIAGNOSIS — R1312 Dysphagia, oropharyngeal phase: Secondary | ICD-10-CM | POA: Diagnosis not present

## 2013-10-05 DIAGNOSIS — Z95828 Presence of other vascular implants and grafts: Secondary | ICD-10-CM | POA: Diagnosis not present

## 2013-10-05 DIAGNOSIS — I82403 Acute embolism and thrombosis of unspecified deep veins of lower extremity, bilateral: Secondary | ICD-10-CM | POA: Diagnosis not present

## 2013-10-05 DIAGNOSIS — G8192 Hemiplegia, unspecified affecting left dominant side: Secondary | ICD-10-CM | POA: Diagnosis not present

## 2013-10-05 DIAGNOSIS — R4189 Other symptoms and signs involving cognitive functions and awareness: Secondary | ICD-10-CM | POA: Diagnosis not present

## 2013-10-09 DIAGNOSIS — S0190XA Unspecified open wound of unspecified part of head, initial encounter: Secondary | ICD-10-CM | POA: Diagnosis not present

## 2013-10-09 DIAGNOSIS — W3400XA Accidental discharge from unspecified firearms or gun, initial encounter: Secondary | ICD-10-CM | POA: Diagnosis not present
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
## 2013-10-12 ENCOUNTER — Inpatient Hospital Stay (HOSPITAL_COMMUNITY)
Admission: EM | Admit: 2013-10-12 | Discharge: 2013-10-21 | DRG: 300 | Disposition: A | Discharge status: Home Health | Payer: Medicare Other | Attending: Internal Medicine | Admitting: Internal Medicine

## 2013-10-12 ENCOUNTER — Inpatient Hospital Stay (HOSPITAL_COMMUNITY): Payer: Medicare Other

## 2013-10-12 ENCOUNTER — Encounter (HOSPITAL_COMMUNITY): Payer: Self-pay | Admitting: Emergency Medicine

## 2013-10-12 DIAGNOSIS — N19 Unspecified kidney failure: Secondary | ICD-10-CM

## 2013-10-12 DIAGNOSIS — E7800 Pure hypercholesterolemia, unspecified: Secondary | ICD-10-CM | POA: Diagnosis present

## 2013-10-12 DIAGNOSIS — Z87820 Personal history of traumatic brain injury: Secondary | ICD-10-CM

## 2013-10-12 DIAGNOSIS — R1310 Dysphagia, unspecified: Secondary | ICD-10-CM | POA: Diagnosis present

## 2013-10-12 DIAGNOSIS — R6882 Decreased libido: Secondary | ICD-10-CM

## 2013-10-12 DIAGNOSIS — N390 Urinary tract infection, site not specified: Secondary | ICD-10-CM | POA: Diagnosis present

## 2013-10-12 DIAGNOSIS — S069XAS Unspecified intracranial injury with loss of consciousness status unknown, sequela: Secondary | ICD-10-CM

## 2013-10-12 DIAGNOSIS — B952 Enterococcus as the cause of diseases classified elsewhere: Secondary | ICD-10-CM | POA: Diagnosis not present

## 2013-10-12 DIAGNOSIS — Z86010 Personal history of colonic polyps: Secondary | ICD-10-CM

## 2013-10-12 DIAGNOSIS — Z7901 Long term (current) use of anticoagulants: Secondary | ICD-10-CM | POA: Diagnosis not present

## 2013-10-12 DIAGNOSIS — W3400XA Accidental discharge from unspecified firearms or gun, initial encounter: Secondary | ICD-10-CM

## 2013-10-12 DIAGNOSIS — R4701 Aphasia: Secondary | ICD-10-CM | POA: Diagnosis present

## 2013-10-12 DIAGNOSIS — Z931 Gastrostomy status: Secondary | ICD-10-CM | POA: Diagnosis not present

## 2013-10-12 DIAGNOSIS — S069XAA Unspecified intracranial injury with loss of consciousness status unknown, initial encounter: Secondary | ICD-10-CM | POA: Diagnosis present

## 2013-10-12 DIAGNOSIS — D500 Iron deficiency anemia secondary to blood loss (chronic): Secondary | ICD-10-CM | POA: Diagnosis present

## 2013-10-12 DIAGNOSIS — D6489 Other specified anemias: Secondary | ICD-10-CM | POA: Diagnosis present

## 2013-10-12 DIAGNOSIS — I6789 Other cerebrovascular disease: Secondary | ICD-10-CM | POA: Diagnosis not present

## 2013-10-12 DIAGNOSIS — K209 Esophagitis, unspecified without bleeding: Secondary | ICD-10-CM

## 2013-10-12 DIAGNOSIS — J309 Allergic rhinitis, unspecified: Secondary | ICD-10-CM

## 2013-10-12 DIAGNOSIS — M75101 Unspecified rotator cuff tear or rupture of right shoulder, not specified as traumatic: Secondary | ICD-10-CM

## 2013-10-12 DIAGNOSIS — R414 Neurologic neglect syndrome: Secondary | ICD-10-CM | POA: Diagnosis present

## 2013-10-12 DIAGNOSIS — D72829 Elevated white blood cell count, unspecified: Secondary | ICD-10-CM

## 2013-10-12 DIAGNOSIS — S069X9S Unspecified intracranial injury with loss of consciousness of unspecified duration, sequela: Secondary | ICD-10-CM

## 2013-10-12 DIAGNOSIS — I82409 Acute embolism and thrombosis of unspecified deep veins of unspecified lower extremity: Principal | ICD-10-CM | POA: Diagnosis present

## 2013-10-12 DIAGNOSIS — Z801 Family history of malignant neoplasm of trachea, bronchus and lung: Secondary | ICD-10-CM

## 2013-10-12 DIAGNOSIS — D62 Acute posthemorrhagic anemia: Secondary | ICD-10-CM | POA: Diagnosis present

## 2013-10-12 DIAGNOSIS — N178 Other acute kidney failure: Secondary | ICD-10-CM | POA: Diagnosis not present

## 2013-10-12 DIAGNOSIS — S0183XS Puncture wound without foreign body of other part of head, sequela: Secondary | ICD-10-CM

## 2013-10-12 DIAGNOSIS — Z7401 Bed confinement status: Secondary | ICD-10-CM | POA: Diagnosis not present

## 2013-10-12 DIAGNOSIS — N179 Acute kidney failure, unspecified: Secondary | ICD-10-CM | POA: Diagnosis present

## 2013-10-12 DIAGNOSIS — M79609 Pain in unspecified limb: Secondary | ICD-10-CM | POA: Diagnosis not present

## 2013-10-12 DIAGNOSIS — C61 Malignant neoplasm of prostate: Secondary | ICD-10-CM

## 2013-10-12 DIAGNOSIS — S069X9A Unspecified intracranial injury with loss of consciousness of unspecified duration, initial encounter: Secondary | ICD-10-CM | POA: Diagnosis present

## 2013-10-12 DIAGNOSIS — G8100 Flaccid hemiplegia affecting unspecified side: Secondary | ICD-10-CM | POA: Diagnosis present

## 2013-10-12 DIAGNOSIS — S0003XA Contusion of scalp, initial encounter: Secondary | ICD-10-CM | POA: Diagnosis not present

## 2013-10-12 DIAGNOSIS — Z7189 Other specified counseling: Secondary | ICD-10-CM

## 2013-10-12 DIAGNOSIS — S0193XA Puncture wound without foreign body of unspecified part of head, initial encounter: Secondary | ICD-10-CM

## 2013-10-12 DIAGNOSIS — E871 Hypo-osmolality and hyponatremia: Secondary | ICD-10-CM

## 2013-10-12 DIAGNOSIS — E785 Hyperlipidemia, unspecified: Secondary | ICD-10-CM | POA: Diagnosis present

## 2013-10-12 DIAGNOSIS — Z0000 Encounter for general adult medical examination without abnormal findings: Secondary | ICD-10-CM

## 2013-10-12 DIAGNOSIS — I10 Essential (primary) hypertension: Secondary | ICD-10-CM | POA: Diagnosis present

## 2013-10-12 DIAGNOSIS — M7989 Other specified soft tissue disorders: Secondary | ICD-10-CM | POA: Diagnosis not present

## 2013-10-12 DIAGNOSIS — R109 Unspecified abdominal pain: Secondary | ICD-10-CM | POA: Diagnosis not present

## 2013-10-12 DIAGNOSIS — I82403 Acute embolism and thrombosis of unspecified deep veins of lower extremity, bilateral: Secondary | ICD-10-CM

## 2013-10-12 DIAGNOSIS — R5381 Other malaise: Secondary | ICD-10-CM | POA: Diagnosis not present

## 2013-10-12 DIAGNOSIS — Z87891 Personal history of nicotine dependence: Secondary | ICD-10-CM | POA: Diagnosis not present

## 2013-10-12 DIAGNOSIS — R5383 Other fatigue: Secondary | ICD-10-CM | POA: Diagnosis not present

## 2013-10-12 DIAGNOSIS — IMO0002 Reserved for concepts with insufficient information to code with codable children: Secondary | ICD-10-CM | POA: Diagnosis not present

## 2013-10-12 DIAGNOSIS — W3400XS Accidental discharge from unspecified firearms or gun, sequela: Secondary | ICD-10-CM

## 2013-10-12 DIAGNOSIS — J9819 Other pulmonary collapse: Secondary | ICD-10-CM | POA: Diagnosis not present

## 2013-10-12 LAB — CBC WITH DIFFERENTIAL/PLATELET
Basophils Absolute: 0 10*3/uL (ref 0.0–0.1)
Basophils Relative: 0 % (ref 0–1)
Eosinophils Absolute: 0.1 10*3/uL (ref 0.0–0.7)
Eosinophils Relative: 1 % (ref 0–5)
HCT: 37.7 % — ABNORMAL LOW (ref 39.0–52.0)
Hemoglobin: 12.6 g/dL — ABNORMAL LOW (ref 13.0–17.0)
Lymphocytes Relative: 10 % — ABNORMAL LOW (ref 12–46)
Lymphs Abs: 1.3 10*3/uL (ref 0.7–4.0)
MCH: 30.6 pg (ref 26.0–34.0)
MCHC: 33.4 g/dL (ref 30.0–36.0)
MCV: 91.5 fL (ref 78.0–100.0)
Monocytes Absolute: 1.3 10*3/uL — ABNORMAL HIGH (ref 0.1–1.0)
Monocytes Relative: 10 % (ref 3–12)
Neutro Abs: 10.4 10*3/uL — ABNORMAL HIGH (ref 1.7–7.7)
Neutrophils Relative %: 79 % — ABNORMAL HIGH (ref 43–77)
Platelets: 158 10*3/uL (ref 150–400)
RBC: 4.12 MIL/uL — ABNORMAL LOW (ref 4.22–5.81)
RDW: 14.6 % (ref 11.5–15.5)
WBC: 13.2 10*3/uL — ABNORMAL HIGH (ref 4.0–10.5)

## 2013-10-12 LAB — COMPREHENSIVE METABOLIC PANEL
ALT: 11 U/L (ref 0–53)
AST: 11 U/L (ref 0–37)
Albumin: 3.6 g/dL (ref 3.5–5.2)
Alkaline Phosphatase: 61 U/L (ref 39–117)
Anion gap: 17 — ABNORMAL HIGH (ref 5–15)
BUN: 48 mg/dL — ABNORMAL HIGH (ref 6–23)
CO2: 23 mEq/L (ref 19–32)
Calcium: 9.6 mg/dL (ref 8.4–10.5)
Chloride: 97 mEq/L (ref 96–112)
Creatinine, Ser: 4.25 mg/dL — ABNORMAL HIGH (ref 0.50–1.35)
GFR calc Af Amer: 14 mL/min — ABNORMAL LOW (ref >90)
GFR calc non Af Amer: 12 mL/min — ABNORMAL LOW (ref >90)
Glucose, Bld: 104 mg/dL — ABNORMAL HIGH (ref 70–99)
Potassium: 5 mEq/L (ref 3.7–5.3)
Sodium: 137 mEq/L (ref 137–147)
Total Bilirubin: 0.7 mg/dL (ref 0.3–1.2)
Total Protein: 7.8 g/dL (ref 6.0–8.3)

## 2013-10-12 LAB — PROTIME-INR
INR: 1.07 (ref 0.00–1.49)
Prothrombin Time: 13.9 seconds (ref 11.6–15.2)

## 2013-10-12 MED ORDER — ONDANSETRON HCL 4 MG PO TABS: 4.0000 mg | ORAL_TABLET | Freq: Four times a day (QID) | ORAL | Status: DC | PRN

## 2013-10-12 MED ORDER — ACETAMINOPHEN 325 MG PO TABS: 650.0000 mg | ORAL_TABLET | Freq: Four times a day (QID) | ORAL | Status: DC | PRN

## 2013-10-12 MED ORDER — ACETAMINOPHEN 325 MG PO TABS
650.0000 mg | ORAL_TABLET | Freq: Four times a day (QID) | ORAL | Status: DC | PRN
  Administered 2013-10-21: 650 mg via ORAL
  Filled 2013-10-12: qty 2

## 2013-10-12 MED ORDER — CETYLPYRIDINIUM CHLORIDE 0.05 % MT LIQD
7.0000 mL | Freq: Two times a day (BID) | OROMUCOSAL | Status: DC
  Administered 2013-10-13 – 2013-10-21 (×15): 7 mL via OROMUCOSAL

## 2013-10-12 MED ORDER — ACETAMINOPHEN 650 MG RE SUPP: 650.0000 mg | Freq: Four times a day (QID) | RECTAL | Status: DC | PRN

## 2013-10-12 MED ORDER — TIZANIDINE HCL 4 MG PO TABS
2.0000 mg | ORAL_TABLET | Freq: Three times a day (TID) | ORAL | Status: DC
  Filled 2013-10-12 (×3): qty 1

## 2013-10-12 MED ORDER — BETHANECHOL CHLORIDE 10 MG PO TABS
10.0000 mg | ORAL_TABLET | Freq: Three times a day (TID) | ORAL | Status: DC
  Administered 2013-10-12 – 2013-10-21 (×27): 10 mg via ORAL
  Filled 2013-10-12 (×29): qty 1

## 2013-10-12 MED ORDER — PANTOPRAZOLE SODIUM 40 MG IV SOLR
40.0000 mg | INTRAVENOUS | Status: DC
  Administered 2013-10-12: 40 mg via INTRAVENOUS
  Filled 2013-10-12 (×2): qty 40

## 2013-10-12 MED ORDER — BACLOFEN 5 MG HALF TABLET
5.0000 mg | ORAL_TABLET | Freq: Two times a day (BID) | ORAL | Status: DC | PRN
  Filled 2013-10-12: qty 1

## 2013-10-12 MED ORDER — SODIUM CHLORIDE 0.9 % IV BOLUS (SEPSIS)
2000.0000 mL | Freq: Once | INTRAVENOUS | Status: AC
  Administered 2013-10-12: 2000 mL via INTRAVENOUS

## 2013-10-12 MED ORDER — SODIUM CHLORIDE 0.9 % IV SOLN
INTRAVENOUS | Status: DC
  Administered 2013-10-12 – 2013-10-15 (×5): via INTRAVENOUS

## 2013-10-12 MED ORDER — METOPROLOL TARTRATE 12.5 MG HALF TABLET
12.5000 mg | ORAL_TABLET | Freq: Two times a day (BID) | ORAL | Status: DC
  Filled 2013-10-12 (×2): qty 1

## 2013-10-12 MED ORDER — TIZANIDINE HCL 2 MG PO TABS
2.0000 mg | ORAL_TABLET | Freq: Three times a day (TID) | ORAL | Status: DC
  Administered 2013-10-12 – 2013-10-14 (×9): 2 mg
  Filled 2013-10-12 (×14): qty 1

## 2013-10-12 MED ORDER — RESOURCE THICKENUP CLEAR PO POWD
ORAL | Status: DC | PRN
  Filled 2013-10-12: qty 125

## 2013-10-12 MED ORDER — ONDANSETRON HCL 4 MG/2ML IJ SOLN: 4.0000 mg | Freq: Four times a day (QID) | INTRAMUSCULAR | Status: DC | PRN

## 2013-10-12 MED ORDER — BETHANECHOL CHLORIDE 10 MG PO TABS
10.0000 mg | ORAL_TABLET | Freq: Three times a day (TID) | ORAL | Status: DC
  Filled 2013-10-12 (×2): qty 1

## 2013-10-12 MED ORDER — FREE WATER
200.0000 mL | Freq: Every day | Status: DC
  Administered 2013-10-12 – 2013-10-21 (×46): 200 mL

## 2013-10-12 MED ORDER — METOPROLOL TARTRATE 12.5 MG HALF TABLET
12.5000 mg | ORAL_TABLET | Freq: Two times a day (BID) | ORAL | Status: DC
  Administered 2013-10-12 – 2013-10-21 (×18): 12.5 mg via ORAL
  Filled 2013-10-12 (×19): qty 1

## 2013-10-12 MED ORDER — AMANTADINE HCL 100 MG PO CAPS
100.0000 mg | ORAL_CAPSULE | ORAL | Status: DC
  Administered 2013-10-12 – 2013-10-20 (×5): 100 mg
  Filled 2013-10-12 (×6): qty 1

## 2013-10-12 MED ORDER — SODIUM CHLORIDE 0.9 % IJ SOLN
3.0000 mL | Freq: Two times a day (BID) | INTRAMUSCULAR | Status: DC
  Administered 2013-10-12 – 2013-10-19 (×5): 3 mL via INTRAVENOUS

## 2013-10-12 MED ORDER — MORPHINE SULFATE 2 MG/ML IJ SOLN
2.0000 mg | INTRAMUSCULAR | Status: DC | PRN
Start: 2013-10-12 — End: 2013-10-15

## 2013-10-12 MED ORDER — PANTOPRAZOLE SODIUM 40 MG PO TBEC: 40.0000 mg | DELAYED_RELEASE_TABLET | Freq: Every day | ORAL | Status: DC

## 2013-10-12 MED ORDER — CHLORHEXIDINE GLUCONATE 0.12 % MT SOLN
15.0000 mL | Freq: Two times a day (BID) | OROMUCOSAL | Status: DC
  Administered 2013-10-12 – 2013-10-15 (×7): 15 mL via OROMUCOSAL
  Filled 2013-10-12 (×13): qty 15

## 2013-10-12 MED ORDER — STARCH (THICKENING) PO POWD
ORAL | Status: DC | PRN
  Filled 2013-10-12: qty 227

## 2013-10-12 NOTE — ED Notes (Signed)
Patient transported to Ultrasound 

## 2013-10-12 NOTE — ED Notes (Signed)
Reports left leg swelling from knee down to foot.
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
## 2013-10-12 NOTE — Progress Notes (Signed)
VASCULAR LAB PRELIMINARY  PRELIMINARY  PRELIMINARY  PRELIMINARY  Bilateral lower extremity venous Dopplers completed.    Preliminary report:  There is acute DVT noted in the right common femoral, proximal femoral, and gastroc veins.  There is acute DVT noted throughout the left lower extremity including the posterior tibial, gastroc, peroneal, popliteal, femoral, profunda, and common femoral veins.   Dominic Mahaney, RVT 10/12/2013, 12:09 PM

## 2013-10-12 NOTE — ED Notes (Signed)
Patient and family advised of need for urine specimen.  Patient and family state they do not want an I&O cath done and would rather have a condom catheter placed to ensure catching of specimen.

## 2013-10-12 NOTE — ED Notes (Signed)
Hospitalist, MD at bedside. 

## 2013-10-12 NOTE — ED Notes (Signed)
Attempted report 

## 2013-10-12 NOTE — Treatment Plan (Signed)
Update: Case was discussed with pt's primary Neurosurgeon, Dr. Kathyrn Sheriff. Per Neurosurgery, rec to repeat head CT to eval for both interval change as well as for baseline prior to starting anticoagulation. If head CT unremarkable for acute bleed, then OK for full dose anticoagulation. Wife updated on this plan. She and pt are both aware of risk for spontaneous bleeding including GI, hematuria, as well as intracranial bleed, which may become life-threatening. Pt and wife agree to continue with anticoagulation if CT neg. Will follow.
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
## 2013-10-12 NOTE — ED Notes (Signed)
Patient returned from Ultrasound. 

## 2013-10-12 NOTE — ED Notes (Signed)
Condom catheter applied.

## 2013-10-12 NOTE — ED Notes (Signed)
Password: [email or web address]

## 2013-10-13 DIAGNOSIS — D72829 Elevated white blood cell count, unspecified: Secondary | ICD-10-CM

## 2013-10-13 DIAGNOSIS — IMO0002 Reserved for concepts with insufficient information to code with codable children: Secondary | ICD-10-CM

## 2013-10-13 DIAGNOSIS — M7989 Other specified soft tissue disorders: Secondary | ICD-10-CM

## 2013-10-13 LAB — CBC
HCT: 33.2 % — ABNORMAL LOW (ref 39.0–52.0)
Hemoglobin: 11 g/dL — ABNORMAL LOW (ref 13.0–17.0)
MCH: 30.2 pg (ref 26.0–34.0)
MCHC: 33.1 g/dL (ref 30.0–36.0)
MCV: 91.2 fL (ref 78.0–100.0)
Platelets: 154 10*3/uL (ref 150–400)
RBC: 3.64 MIL/uL — ABNORMAL LOW (ref 4.22–5.81)
RDW: 14.6 % (ref 11.5–15.5)
WBC: 11.5 10*3/uL — ABNORMAL HIGH (ref 4.0–10.5)

## 2013-10-13 LAB — COMPREHENSIVE METABOLIC PANEL
ALT: 8 U/L (ref 0–53)
AST: 21 U/L (ref 0–37)
Albumin: 2.9 g/dL — ABNORMAL LOW (ref 3.5–5.2)
Alkaline Phosphatase: 51 U/L (ref 39–117)
Anion gap: 13 (ref 5–15)
BUN: 46 mg/dL — ABNORMAL HIGH (ref 6–23)
CO2: 20 mEq/L (ref 19–32)
Calcium: 8.2 mg/dL — ABNORMAL LOW (ref 8.4–10.5)
Chloride: 106 mEq/L (ref 96–112)
Creatinine, Ser: 3.26 mg/dL — ABNORMAL HIGH (ref 0.50–1.35)
GFR calc Af Amer: 19 mL/min — ABNORMAL LOW (ref >90)
GFR calc non Af Amer: 17 mL/min — ABNORMAL LOW (ref >90)
Glucose, Bld: 87 mg/dL (ref 70–99)
Potassium: 4.9 mEq/L (ref 3.7–5.3)
Sodium: 139 mEq/L (ref 137–147)
Total Bilirubin: 0.7 mg/dL (ref 0.3–1.2)
Total Protein: 6.5 g/dL (ref 6.0–8.3)

## 2013-10-13 LAB — URINE MICROSCOPIC-ADD ON

## 2013-10-13 LAB — URINALYSIS, ROUTINE W REFLEX MICROSCOPIC
Bilirubin Urine: NEGATIVE
Glucose, UA: NEGATIVE mg/dL
Ketones, ur: NEGATIVE mg/dL
Nitrite: NEGATIVE
Protein, ur: 30 mg/dL — AB
Specific Gravity, Urine: 1.019 (ref 1.005–1.030)
Urobilinogen, UA: 0.2 mg/dL (ref 0.0–1.0)
pH: 5 (ref 5.0–8.0)

## 2013-10-13 MED ORDER — PANTOPRAZOLE SODIUM 40 MG PO TBEC: 40.0000 mg | DELAYED_RELEASE_TABLET | Freq: Every day | ORAL | Status: DC

## 2013-10-13 MED ORDER — PANTOPRAZOLE SODIUM 40 MG PO PACK
40.0000 mg | PACK | Freq: Every day | ORAL | Status: DC
  Administered 2013-10-13 – 2013-10-21 (×9): 40 mg
  Filled 2013-10-13 (×9): qty 20

## 2013-10-13 MED ORDER — HEPARIN (PORCINE) IN NACL 100-0.45 UNIT/ML-% IJ SOLN
1300.0000 [IU]/h | INTRAMUSCULAR | Status: DC
  Administered 2013-10-13 – 2013-10-15 (×2): 1100 [IU]/h via INTRAVENOUS
  Administered 2013-10-16 – 2013-10-21 (×5): 1300 [IU]/h via INTRAVENOUS
  Filled 2013-10-13 (×14): qty 250
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
## 2013-10-13 NOTE — Progress Notes (Signed)
I was informed of Mr. Helminiak admission to the hospital due to acute renal failure and discovery of significant BLE DVT. These would normally be treated by anticoagulation. I did review his head CT which does not demonstrate any residual acute blood to suggest recurrent or ongoing hemorrhage. While there will always exist a risk of hemorrhage into the site of previous injury (or elsewhere in the brain), there is no absolute contraindication to anticoagulation at this point.
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
## 2013-10-14 LAB — CBC
HCT: 32.1 % — ABNORMAL LOW (ref 39.0–52.0)
Hemoglobin: 10.6 g/dL — ABNORMAL LOW (ref 13.0–17.0)
MCH: 29.9 pg (ref 26.0–34.0)
MCHC: 33 g/dL (ref 30.0–36.0)
MCV: 90.4 fL (ref 78.0–100.0)
Platelets: 162 10*3/uL (ref 150–400)
RBC: 3.55 MIL/uL — ABNORMAL LOW (ref 4.22–5.81)
RDW: 14.3 % (ref 11.5–15.5)
WBC: 9 10*3/uL (ref 4.0–10.5)

## 2013-10-14 LAB — HEPARIN LEVEL (UNFRACTIONATED)
Heparin Unfractionated: 0.31 IU/mL (ref 0.30–0.70)
Heparin Unfractionated: 0.34 IU/mL (ref 0.30–0.70)

## 2013-10-14 MED ORDER — WARFARIN SODIUM 5 MG PO TABS
5.0000 mg | ORAL_TABLET | Freq: Once | ORAL | Status: AC
  Administered 2013-10-14: 5 mg via ORAL
  Filled 2013-10-14: qty 1

## 2013-10-14 MED ORDER — WARFARIN - PHARMACIST DOSING INPATIENT
Freq: Every day | Status: DC
  Administered 2013-10-16 – 2013-10-21 (×3)

## 2013-10-14 MED ORDER — COUMADIN BOOK
Freq: Once | Status: AC
Start: 2013-10-14 — End: 2013-10-14
  Administered 2013-10-14: 17:00:00
  Filled 2013-10-14: qty 1

## 2013-10-14 MED ORDER — WARFARIN VIDEO: Freq: Once | Status: DC
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
## 2013-10-15 ENCOUNTER — Ambulatory Visit: Payer: Medicare Other | Admitting: Internal Medicine

## 2013-10-15 LAB — BASIC METABOLIC PANEL
Anion gap: 13 (ref 5–15)
BUN: 35 mg/dL — ABNORMAL HIGH (ref 6–23)
CO2: 19 mEq/L (ref 19–32)
Calcium: 8.7 mg/dL (ref 8.4–10.5)
Chloride: 103 mEq/L (ref 96–112)
Creatinine, Ser: 1.92 mg/dL — ABNORMAL HIGH (ref 0.50–1.35)
GFR calc Af Amer: 37 mL/min — ABNORMAL LOW (ref >90)
GFR calc non Af Amer: 32 mL/min — ABNORMAL LOW (ref >90)
Glucose, Bld: 84 mg/dL (ref 70–99)
Potassium: 3.9 mEq/L (ref 3.7–5.3)
Sodium: 135 mEq/L — ABNORMAL LOW (ref 137–147)

## 2013-10-15 LAB — CBC
HCT: 32.9 % — ABNORMAL LOW (ref 39.0–52.0)
Hemoglobin: 10.7 g/dL — ABNORMAL LOW (ref 13.0–17.0)
MCH: 30 pg (ref 26.0–34.0)
MCHC: 32.5 g/dL (ref 30.0–36.0)
MCV: 92.2 fL (ref 78.0–100.0)
Platelets: 202 10*3/uL (ref 150–400)
RBC: 3.57 MIL/uL — ABNORMAL LOW (ref 4.22–5.81)
RDW: 14 % (ref 11.5–15.5)
WBC: 9.2 10*3/uL (ref 4.0–10.5)

## 2013-10-15 LAB — PROTIME-INR
INR: 1.19 (ref 0.00–1.49)
Prothrombin Time: 15.1 seconds (ref 11.6–15.2)

## 2013-10-15 LAB — HEPARIN LEVEL (UNFRACTIONATED): Heparin Unfractionated: 0.38 IU/mL (ref 0.30–0.70)

## 2013-10-15 MED ORDER — ALBUTEROL SULFATE (2.5 MG/3ML) 0.083% IN NEBU: 2.5000 mg | INHALATION_SOLUTION | RESPIRATORY_TRACT | Status: DC | PRN

## 2013-10-15 MED ORDER — TIZANIDINE HCL 2 MG PO TABS
2.0000 mg | ORAL_TABLET | Freq: Four times a day (QID) | ORAL | Status: DC | PRN
  Filled 2013-10-15: qty 1

## 2013-10-15 NOTE — Progress Notes (Signed)
10/15/13 1500  Clinical Encounter Type  Visited With Patient and family together  Visit Type Initial;Spiritual support  Referral From Family  Spiritual Encounters  Spiritual Needs Grief support;Emotional  Stress Factors  Patient Stress Factors Loss  Family Stress Factors Loss   Chaplain was asked by patient's family member to visit. Patient's wife introduced herself to me in the lobby of [address]. Patient's wife explained that she would like a Chaplain's support because she needed to tell her husband some sad news. Patient's wife explained that she was notified by several family and friends that the patient's brother and the patient's best friend both passed away today. Patient's wife also explained that since the patient's initial admission into the hospital, the patient has been a lot more emotional than usual. Chaplain accompanied patient's wife as she shared this news with the patient. Patient was emotional and broke down into tears several times. Both the patient and his wife were appreciative of my presence and sited a positive experience with a particular chaplain the last time they were here. Chaplain prayed with patient and his wife. Patient shared several memories of his brother and his best friend. Patient had been friends with this gentleman for over 65 five years. Patient is continuing to grieve as him and his wife make sense of a difficult day. Chaplain will continue to provide emotional and spiritual support as needed. Gar Ponto, Chaplain 3:51 PM
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
## 2013-10-15 NOTE — Care Management Note (Signed)
CARE MANAGEMENT NOTE 10/15/2013  Patient:  Austin [name], Austin [name]   Account Number:  [id number]  Date Initiated:  10/15/2013  Documentation initiated by:  Taren Toops  Subjective/Objective Assessment:   CM following for progression and d/c planning.     Action/Plan:   IM given on 10/15/2013 following for d/c needs and orders, spoke with pt and pt caregiver.   Anticipated DC Date:     Anticipated DC Plan:  [place]         Choice offered to / List presented to:             Status of service:   Medicare Important Message given?  YES (If response is "NO", the following Medicare IM given date fields will be blank) Date Medicare IM given:  10/15/2013 Medicare IM given by:  Little Bashore Date Additional Medicare IM given:   Additional Medicare IM given by:    Discharge Disposition:    Per UR Regulation:    If discussed at Long Length of Stay Meetings, dates discussed:    Comments:
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
## 2013-10-15 NOTE — Progress Notes (Signed)
PT Cancellation Note  Patient Details Name: Austin [name] MRN: [id number] DOB: 1933/01/21   Cancelled Treatment:    Reason Eval/Treat Not Completed: Other (comment)   Orders received, chart reviewed;  Initiated PT, however pt's caregiver strongly requested we wait to mobilize due to pt's DVTs  Later I spoke with Dr. Eliseo Squires, who indicated mobilizing is safe and appropriate;  Will follow-up for PT eval tomorrow;  Thanks,  Roney Marion, PT  Acute Rehabilitation Services Pager [phone] Office [phone]    Roney Marion [facility] 10/15/2013, 3:56 PM

## 2013-10-16 LAB — HEPARIN LEVEL (UNFRACTIONATED)
Heparin Unfractionated: 0.16 IU/mL — ABNORMAL LOW (ref 0.30–0.70)
Heparin Unfractionated: 0.43 IU/mL (ref 0.30–0.70)

## 2013-10-16 LAB — BASIC METABOLIC PANEL
Anion gap: 13 (ref 5–15)
BUN: 29 mg/dL — ABNORMAL HIGH (ref 6–23)
CO2: 21 mEq/L (ref 19–32)
Calcium: 9 mg/dL (ref 8.4–10.5)
Chloride: 103 mEq/L (ref 96–112)
Creatinine, Ser: 2.08 mg/dL — ABNORMAL HIGH (ref 0.50–1.35)
GFR calc Af Amer: 33 mL/min — ABNORMAL LOW (ref >90)
GFR calc non Af Amer: 29 mL/min — ABNORMAL LOW (ref >90)
Glucose, Bld: 90 mg/dL (ref 70–99)
Potassium: 3.9 mEq/L (ref 3.7–5.3)
Sodium: 137 mEq/L (ref 137–147)

## 2013-10-16 LAB — CBC
HCT: 33 % — ABNORMAL LOW (ref 39.0–52.0)
Hemoglobin: 11.1 g/dL — ABNORMAL LOW (ref 13.0–17.0)
MCH: 29.9 pg (ref 26.0–34.0)
MCHC: 33.6 g/dL (ref 30.0–36.0)
MCV: 88.9 fL (ref 78.0–100.0)
Platelets: 217 10*3/uL (ref 150–400)
RBC: 3.71 MIL/uL — ABNORMAL LOW (ref 4.22–5.81)
RDW: 14 % (ref 11.5–15.5)
WBC: 8.9 10*3/uL (ref 4.0–10.5)

## 2013-10-16 LAB — PROTIME-INR
INR: 1.12 (ref 0.00–1.49)
Prothrombin Time: 14.4 seconds (ref 11.6–15.2)

## 2013-10-16 MED ORDER — WARFARIN SODIUM 7.5 MG PO TABS
7.5000 mg | ORAL_TABLET | Freq: Once | ORAL | Status: AC
  Administered 2013-10-16: 7.5 mg via ORAL
  Filled 2013-10-16: qty 1

## 2013-10-16 MED ORDER — SODIUM CHLORIDE 0.9 % IV SOLN
INTRAVENOUS | Status: DC
  Administered 2013-10-16 – 2013-10-20 (×3): via INTRAVENOUS
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
## 2013-10-16 NOTE — Discharge Instructions (Signed)
Information on my medicine - Coumadin®   (Warfarin) ° °This medication education was reviewed with me or my healthcare representative as part of my discharge preparation.  The pharmacist that spoke with me during my hospital stay was:  Tsutomu Barfoot Ann, RPH ° °Why was Coumadin prescribed for you? °Coumadin was prescribed for you because you have a blood clot or a medical condition that can cause an increased risk of forming blood clots. Blood clots can cause serious health problems by blocking the flow of blood to the heart, lung, or brain. Coumadin can prevent harmful blood clots from forming. °As a reminder your indication for Coumadin is:   Deep Vein Thrombosis Treatment ° °What test will check on my response to Coumadin? °While on Coumadin (warfarin) you will need to have an INR test regularly to ensure that your dose is keeping you in the desired range. The INR (international normalized ratio) number is calculated from the result of the laboratory test called prothrombin time (PT). ° °If an INR APPOINTMENT HAS NOT ALREADY BEEN MADE FOR YOU please schedule an appointment to have this lab work done by your health care provider within 7 days. °Your INR goal is usually a number between:  2 to 3 or your provider may give you a more narrow range like 2-2.5.  Ask your health care provider during an office visit what your goal INR is. ° °What  do you need to  know  About  COUMADIN? °Take Coumadin (warfarin) exactly as prescribed by your healthcare provider about the same time each day.  DO NOT stop taking without talking to the doctor who prescribed the medication.  Stopping without other blood clot prevention medication to take the place of Coumadin may increase your risk of developing a new clot or stroke.  Get refills before you run out. ° °What do you do if you miss a dose? °If you miss a dose, take it as soon as you remember on the same day then continue your regularly scheduled regimen the next day.  Do not  take two doses of Coumadin at the same time. ° °Important Safety Information °A possible side effect of Coumadin (Warfarin) is an increased risk of bleeding. You should call your healthcare provider right away if you experience any of the following: °  Bleeding from an injury or your nose that does not stop. °  Unusual colored urine (red or dark brown) or unusual colored stools (red or black). °  Unusual bruising for unknown reasons. °  A serious fall or if you hit your head (even if there is no bleeding). ° °Some foods or medicines interact with Coumadin® (warfarin) and might alter your response to warfarin. To help avoid this: °  Eat a balanced diet, maintaining a consistent amount of Vitamin K. °  Notify your provider about major diet changes you plan to make. °  Avoid alcohol or limit your intake to 1 drink for women and 2 drinks for men per day. °(1 drink is 5 oz. wine, 12 oz. beer, or 1.5 oz. liquor.) ° °Make sure that ANY health care provider who prescribes medication for you knows that you are taking Coumadin (warfarin).  Also make sure the healthcare provider who is monitoring your Coumadin knows when you have started a new medication including herbals and non-prescription products. ° °Coumadin® (Warfarin)  Major Drug Interactions  °Increased Warfarin Effect Decreased Warfarin Effect  °Alcohol (large quantities) °Antibiotics (esp. Septra/Bactrim, Flagyl, Cipro) °Amiodarone (Cordarone) °Aspirin (ASA) °Cimetidine (  Tagamet) °Megestrol (Megace) °NSAIDs (ibuprofen, naproxen, etc.) °Piroxicam (Feldene) °Propafenone (Rythmol SR) °Propranolol (Inderal) °Isoniazid (INH) °Posaconazole (Noxafil) Barbiturates (Phenobarbital) °Carbamazepine (Tegretol) °Chlordiazepoxide (Librium) °Cholestyramine (Questran) °Griseofulvin °Oral Contraceptives °Rifampin °Sucralfate (Carafate) °Vitamin K  ° °Coumadin® (Warfarin) Major Herbal Interactions  °Increased Warfarin Effect Decreased Warfarin Effect  °Garlic °Ginseng °Ginkgo biloba  Coenzyme Q10 °Green tea °St. John’s wort   ° °Coumadin® (Warfarin) FOOD Interactions  °Eat a consistent number of servings per week of foods HIGH in Vitamin K °(1 serving = ½ cup)  °Collards (cooked, or boiled & drained) °Kale (cooked, or boiled & drained) °Mustard greens (cooked, or boiled & drained) °Parsley *serving size only = ¼ cup °Spinach (cooked, or boiled & drained) °Swiss chard (cooked, or boiled & drained) °Turnip greens (cooked, or boiled & drained)  °Eat a consistent number of servings per week of foods MEDIUM-HIGH in Vitamin K °(1 serving = 1 cup)  °Asparagus (cooked, or boiled & drained) °Broccoli (cooked, boiled & drained, or raw & chopped) °Brussel sprouts (cooked, or boiled & drained) *serving size only = ½ cup °Lettuce, raw (green leaf, endive, romaine) °Spinach, raw °Turnip greens, raw & chopped  ° °These websites have more information on Coumadin (warfarin):  www.coumadin.com; °www.ahrq.gov/consumer/coumadin.htm; ° °  °
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
## 2013-10-17 ENCOUNTER — Inpatient Hospital Stay (HOSPITAL_COMMUNITY): Payer: Medicare Other

## 2013-10-17 LAB — URINALYSIS, ROUTINE W REFLEX MICROSCOPIC
Bilirubin Urine: NEGATIVE
Glucose, UA: NEGATIVE mg/dL
Ketones, ur: NEGATIVE mg/dL
Nitrite: NEGATIVE
Protein, ur: NEGATIVE mg/dL
Specific Gravity, Urine: 1.008 (ref 1.005–1.030)
Urobilinogen, UA: 0.2 mg/dL (ref 0.0–1.0)
pH: 5 (ref 5.0–8.0)

## 2013-10-17 LAB — BASIC METABOLIC PANEL
Anion gap: 11 (ref 5–15)
BUN: 24 mg/dL — ABNORMAL HIGH (ref 6–23)
CO2: 22 mEq/L (ref 19–32)
Calcium: 8.8 mg/dL (ref 8.4–10.5)
Chloride: 106 mEq/L (ref 96–112)
Creatinine, Ser: 1.79 mg/dL — ABNORMAL HIGH (ref 0.50–1.35)
GFR calc Af Amer: 40 mL/min — ABNORMAL LOW (ref >90)
GFR calc non Af Amer: 35 mL/min — ABNORMAL LOW (ref >90)
Glucose, Bld: 85 mg/dL (ref 70–99)
Potassium: 3.9 mEq/L (ref 3.7–5.3)
Sodium: 139 mEq/L (ref 137–147)

## 2013-10-17 LAB — URINE MICROSCOPIC-ADD ON

## 2013-10-17 LAB — CBC
HCT: 32.1 % — ABNORMAL LOW (ref 39.0–52.0)
Hemoglobin: 10.8 g/dL — ABNORMAL LOW (ref 13.0–17.0)
MCH: 30.3 pg (ref 26.0–34.0)
MCHC: 33.6 g/dL (ref 30.0–36.0)
MCV: 90.2 fL (ref 78.0–100.0)
Platelets: 244 10*3/uL (ref 150–400)
RBC: 3.56 MIL/uL — ABNORMAL LOW (ref 4.22–5.81)
RDW: 14.2 % (ref 11.5–15.5)
WBC: 9.1 10*3/uL (ref 4.0–10.5)

## 2013-10-17 LAB — HEPARIN LEVEL (UNFRACTIONATED): Heparin Unfractionated: 0.47 IU/mL (ref 0.30–0.70)

## 2013-10-17 LAB — PROTIME-INR
INR: 1.17 (ref 0.00–1.49)
Prothrombin Time: 14.9 seconds (ref 11.6–15.2)

## 2013-10-17 MED ORDER — WARFARIN SODIUM 7.5 MG PO TABS
7.5000 mg | ORAL_TABLET | Freq: Once | ORAL | Status: AC
  Administered 2013-10-17: 7.5 mg via ORAL
  Filled 2013-10-17: qty 1
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
## 2013-10-18 DIAGNOSIS — N390 Urinary tract infection, site not specified: Secondary | ICD-10-CM

## 2013-10-18 DIAGNOSIS — N179 Acute kidney failure, unspecified: Secondary | ICD-10-CM

## 2013-10-18 LAB — BASIC METABOLIC PANEL
Anion gap: 14 (ref 5–15)
BUN: 21 mg/dL (ref 6–23)
CO2: 21 mEq/L (ref 19–32)
Calcium: 8.8 mg/dL (ref 8.4–10.5)
Chloride: 104 mEq/L (ref 96–112)
Creatinine, Ser: 1.58 mg/dL — ABNORMAL HIGH (ref 0.50–1.35)
GFR calc Af Amer: 47 mL/min — ABNORMAL LOW (ref >90)
GFR calc non Af Amer: 40 mL/min — ABNORMAL LOW (ref >90)
Glucose, Bld: 83 mg/dL (ref 70–99)
Potassium: 4.1 mEq/L (ref 3.7–5.3)
Sodium: 139 mEq/L (ref 137–147)

## 2013-10-18 LAB — CBC
HCT: 32.4 % — ABNORMAL LOW (ref 39.0–52.0)
Hemoglobin: 10.7 g/dL — ABNORMAL LOW (ref 13.0–17.0)
MCH: 29.6 pg (ref 26.0–34.0)
MCHC: 33 g/dL (ref 30.0–36.0)
MCV: 89.8 fL (ref 78.0–100.0)
Platelets: 213 10*3/uL (ref 150–400)
RBC: 3.61 MIL/uL — ABNORMAL LOW (ref 4.22–5.81)
RDW: 14.2 % (ref 11.5–15.5)
WBC: 8.7 10*3/uL (ref 4.0–10.5)

## 2013-10-18 LAB — PROTIME-INR
INR: 1.32 (ref 0.00–1.49)
Prothrombin Time: 16.4 seconds — ABNORMAL HIGH (ref 11.6–15.2)

## 2013-10-18 LAB — HEPARIN LEVEL (UNFRACTIONATED): Heparin Unfractionated: 0.39 IU/mL (ref 0.30–0.70)

## 2013-10-18 MED ORDER — WARFARIN SODIUM 7.5 MG PO TABS
7.5000 mg | ORAL_TABLET | Freq: Once | ORAL | Status: AC
Start: 2013-10-18 — End: 2013-10-18
  Administered 2013-10-18: 7.5 mg via ORAL
  Filled 2013-10-18: qty 1

## 2013-10-18 MED ORDER — CIPROFLOXACIN HCL 250 MG PO TABS
250.0000 mg | ORAL_TABLET | Freq: Two times a day (BID) | ORAL | Status: DC
  Administered 2013-10-18 – 2013-10-21 (×6): 250 mg via ORAL
  Filled 2013-10-18 (×8): qty 1

## 2013-10-18 NOTE — Progress Notes (Signed)
ANTICOAGULATION CONSULT NOTE - Follow Up Consult  Pharmacy Consult for coumadin and heparin Indication: DVT  No Known Allergies  Patient Measurements: Height: 5' 6.5" (168.9 cm) Weight: 181 lb (82.101 kg) IBW/kg (Calculated) : 64.95 Heparin Dosing Weight:   Vital Signs: Temp: 98.2 F (36.8 C) (09/26 0955) Temp src: Oral (09/26 0955) BP: 142/75 mmHg (09/26 0955) Pulse Rate: 80 (09/26 0955)  Labs:  Recent Labs  10/16/13 0754 10/16/13 2020 10/17/13 0602 10/18/13 0600  HGB 11.1*  --  10.8* 10.7*  HCT 33.0*  --  32.1* 32.4*  PLT 217  --  244 213  LABPROT 14.4  --  14.9 16.4*  INR 1.12  --  1.17 1.32  HEPARINUNFRC 0.16* 0.43 0.47 0.39  CREATININE 2.08*  --  1.79* 1.58*    Estimated Creatinine Clearance: 39.1 ml/min (by C-G formula based on Cr of 1.58).   Medications:  Scheduled:  . amantadine  100 mg Per Tube QODAY  . antiseptic oral rinse  7 mL Mouth Rinse q12n4p  . bethanechol  10 mg Oral TID  . free water  200 mL Per Tube 5 X Daily  . metoprolol tartrate  12.5 mg Oral BID  . pantoprazole sodium  40 mg Per Tube Daily  . sodium chloride  3 mL Intravenous Q12H  . warfarin   Does not apply Once  . Warfarin - Pharmacist Dosing Inpatient   Does not apply q1800   Infusions:  . sodium chloride 30 mL/hr at 10/18/13 0210  . heparin 1,300 Units/hr (10/18/13 0843)    Assessment: 80 yo male with new DVT is currently on subtherapeutic coumadin bridging with therapeutic heparin. Heparin level today is 0.39 and INR is up to 1.32. Day 4/5 of VTE overlap. Goal of Therapy:  Heparin level 0.3-0.7 units/ml; INR 2-3 Monitor platelets by anticoagulation protocol: Yes   Plan:  Continue heparin at 1300 units/hr - Warfarin 7.5 mg x 1   Safaa Stingley, Tsz-Yin 10/18/2013,11:49 AM
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
## 2013-10-19 DIAGNOSIS — D62 Acute posthemorrhagic anemia: Secondary | ICD-10-CM

## 2013-10-19 LAB — URINE CULTURE: Colony Count: >=100000

## 2013-10-19 LAB — BASIC METABOLIC PANEL
Anion gap: 13 (ref 5–15)
BUN: 19 mg/dL (ref 6–23)
CO2: 23 mEq/L (ref 19–32)
Calcium: 8.7 mg/dL (ref 8.4–10.5)
Chloride: 104 mEq/L (ref 96–112)
Creatinine, Ser: 1.52 mg/dL — ABNORMAL HIGH (ref 0.50–1.35)
GFR calc Af Amer: 49 mL/min — ABNORMAL LOW (ref >90)
GFR calc non Af Amer: 42 mL/min — ABNORMAL LOW (ref >90)
Glucose, Bld: 89 mg/dL (ref 70–99)
Potassium: 3.9 mEq/L (ref 3.7–5.3)
Sodium: 140 mEq/L (ref 137–147)

## 2013-10-19 LAB — PROTIME-INR
INR: 1.59 — ABNORMAL HIGH (ref 0.00–1.49)
Prothrombin Time: 19 seconds — ABNORMAL HIGH (ref 11.6–15.2)

## 2013-10-19 LAB — CBC
HCT: 31.1 % — ABNORMAL LOW (ref 39.0–52.0)
Hemoglobin: 10.3 g/dL — ABNORMAL LOW (ref 13.0–17.0)
MCH: 29.7 pg (ref 26.0–34.0)
MCHC: 33.1 g/dL (ref 30.0–36.0)
MCV: 89.6 fL (ref 78.0–100.0)
Platelets: 293 10*3/uL (ref 150–400)
RBC: 3.47 MIL/uL — ABNORMAL LOW (ref 4.22–5.81)
RDW: 14.4 % (ref 11.5–15.5)
WBC: 8.3 10*3/uL (ref 4.0–10.5)

## 2013-10-19 LAB — HEPARIN LEVEL (UNFRACTIONATED): Heparin Unfractionated: 0.52 IU/mL (ref 0.30–0.70)

## 2013-10-19 MED ORDER — WARFARIN SODIUM 5 MG PO TABS
5.0000 mg | ORAL_TABLET | Freq: Once | ORAL | Status: AC
  Administered 2013-10-19: 5 mg via ORAL
  Filled 2013-10-19: qty 1

## 2013-10-19 MED ORDER — AMOXICILLIN 250 MG/5ML PO SUSR
500.0000 mg | Freq: Three times a day (TID) | ORAL | Status: DC
  Administered 2013-10-19 – 2013-10-21 (×6): 500 mg via ORAL
  Filled 2013-10-19 (×10): qty 10

## 2013-10-19 NOTE — Progress Notes (Signed)
Pts personal caregiver attempted to move pt back to bed and pt caregiver lowered pt to floor and called out to ask for help.  Beverlee Nims was called by [place] charge nurse to go assist back to bed. Dr. Conley Canal and Beverlee Nims was walking into room during this time. Dr. Conley Canal in room and assessed pt no apparent injuries.

## 2013-10-19 NOTE — Progress Notes (Signed)
Pts caregiver requested bed alarm off while she was in room and would let us know if she left the room.

## 2013-10-19 NOTE — Progress Notes (Signed)
Assessed pt, denies pain, discomfort.  Pleasant.  Caregiver and Family at bedside. No apparent injuries.
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
## 2013-10-19 NOTE — Progress Notes (Signed)
10/19/13 1245  What Happened  Was fall witnessed? Yes  Who witnessed fall? Shelton Silvas, NT and Dr. Conley Canal  Patients activity before fall to/from bed, chair, or stretcher  Point of contact buttocks  Was patient injured? No  Follow Up  MD notified Dr. Conley Canal  Time MD notified 1230  Family notified Yes-comment (family at bedside)  Time family notified 1230  Additional tests No  Progress note created (see row info) Yes  Adult Fall Risk Assessment  Risk Factor Category (scoring not indicated) Fall has occurred during this admission (document High fall risk)  Patient's Fall Risk High Fall Risk (>13 points)  Adult Fall Risk Interventions  Required Bundle Interventions *See Row Information* High fall risk - low, moderate, and high requirements implemented  Additional Interventions Fall risk signage;PT/OT need assessed if change in mobility from baseline;Secure all tubes/drains;Use of appropriate toileting equipment (bedpan, BSC, etc.)  Vitals  Temp 98.4 F (36.9 C)  Temp src Oral  BP 123/70 mmHg  BP Location Right arm  BP Method Automatic  Patient Position (if appropriate) Lying  Pulse Rate 83  Pulse Rate Source Dinamap  Resp 16  Oxygen Therapy  SpO2 96 %  O2 Device None (Room air)  Pain Assessment  Pain Assessment No/denies pain  Pain Score 0   Patient with a private caretaker at bedside. Patient's caretaker had been refusing to allow the bed/chair alarms to be put in place stating she takes care of him independently at home. Caretaker re-educated on the importance of having bed/chair alarms in place and using call bell to ask for assistance.

## 2013-10-19 NOTE — Progress Notes (Addendum)
ANTICOAGULATION CONSULT NOTE - Follow Up Consult  Pharmacy Consult for coumadin and heparin Indication: DVT  No Known Allergies  Patient Measurements: Height: 5' 6.5" (168.9 cm) Weight: 182 lb 1.3 oz (82.59 kg) IBW/kg (Calculated) : 64.95 Heparin Dosing Weight:   Vital Signs: Temp: 98.1 F (36.7 C) (09/27 0545) Temp src: Oral (09/27 0545) BP: 101/52 mmHg (09/27 0545) Pulse Rate: 81 (09/27 0545)  Labs:  Recent Labs  10/17/13 0602 10/18/13 0600 10/19/13 0530  HGB 10.8* 10.7* 10.3*  HCT 32.1* 32.4* 31.1*  PLT 244 213 293  LABPROT 14.9 16.4* 19.0*  INR 1.17 1.32 1.59*  HEPARINUNFRC 0.47 0.39 0.52  CREATININE 1.79* 1.58* 1.52*    Estimated Creatinine Clearance: 40.8 ml/min (by C-G formula based on Cr of 1.52).   Medications:  Scheduled:  . amantadine  100 mg Per Tube QODAY  . antiseptic oral rinse  7 mL Mouth Rinse q12n4p  . bethanechol  10 mg Oral TID  . ciprofloxacin  250 mg Oral BID  . free water  200 mL Per Tube 5 X Daily  . metoprolol tartrate  12.5 mg Oral BID  . pantoprazole sodium  40 mg Per Tube Daily  . sodium chloride  3 mL Intravenous Q12H  . warfarin   Does not apply Once  . Warfarin - Pharmacist Dosing Inpatient   Does not apply q1800   Infusions:  . sodium chloride 30 mL/hr at 10/19/13 0600  . heparin 1,300 Units/hr (10/19/13 0517)    Assessment: 80 yo male with DVT is currently on subtherapeutic coumadin bridging with therapeutic heparin.  Day 5 of 5 of VTE overlap.  Heparin level today is 0.52 and INR is up to 1.59.  Now on cipro Goal of Therapy:  Heparin level 0.3-0.7 units/ml; INR 2-3 Monitor platelets by anticoagulation protocol: Yes   Plan:  - Continue heparin at 1300 units/hr - Warfarin 5 mg x 1 - Educated on warfarin 9/24   Sharena Dibenedetto, Tsz-Yin 10/19/2013,11:36 AM

## 2013-10-20 LAB — CBC
HCT: 32.3 % — ABNORMAL LOW (ref 39.0–52.0)
Hemoglobin: 10.7 g/dL — ABNORMAL LOW (ref 13.0–17.0)
MCH: 29.8 pg (ref 26.0–34.0)
MCHC: 33.1 g/dL (ref 30.0–36.0)
MCV: 90 fL (ref 78.0–100.0)
Platelets: 300 10*3/uL (ref 150–400)
RBC: 3.59 MIL/uL — ABNORMAL LOW (ref 4.22–5.81)
RDW: 14.2 % (ref 11.5–15.5)
WBC: 7.9 10*3/uL (ref 4.0–10.5)

## 2013-10-20 LAB — HEPARIN LEVEL (UNFRACTIONATED): Heparin Unfractionated: 0.38 IU/mL (ref 0.30–0.70)

## 2013-10-20 LAB — PROTIME-INR
INR: 1.67 — ABNORMAL HIGH (ref 0.00–1.49)
Prothrombin Time: 19.7 seconds — ABNORMAL HIGH (ref 11.6–15.2)

## 2013-10-20 MED ORDER — DOCUSATE SODIUM 50 MG/5ML PO LIQD
100.0000 mg | Freq: Two times a day (BID) | ORAL | Status: DC
  Administered 2013-10-20 – 2013-10-21 (×3): 100 mg
  Filled 2013-10-20 (×4): qty 10

## 2013-10-20 MED ORDER — WARFARIN SODIUM 7.5 MG PO TABS
7.5000 mg | ORAL_TABLET | Freq: Once | ORAL | Status: AC
  Administered 2013-10-20: 7.5 mg via ORAL
  Filled 2013-10-20: qty 1
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
## 2013-10-20 NOTE — Care Management Note (Signed)
CARE MANAGEMENT NOTE 10/20/2013  Patient:  Austin [name], Austin [name]   Account Number:  [id number]  Date Initiated:  10/15/2013  Documentation initiated by:  Jarrod Bodkins  Subjective/Objective Assessment:   CM following for progression and d/c planning.     Action/Plan:   IM given on 10/15/2013 following for d/c needs and orders, spoke with pt and pt caregiver.   Anticipated DC Date:     Anticipated DC Plan:  [place]         Choice offered to / List presented to:             Status of service:   Medicare Important Message given?  YES (If response is "NO", the following Medicare IM given date fields will be blank) Date Medicare IM given:  10/15/2013 Medicare IM given by:  Guerin Lashomb Date Additional Medicare IM given:  10/20/2013 Additional Medicare IM given by:  [facility]  Discharge Disposition:    Per UR Regulation:    If discussed at Long Length of Stay Meetings, dates discussed:    Comments:

## 2013-10-20 NOTE — Progress Notes (Signed)
ANTICOAGULATION CONSULT NOTE - Follow Up Consult  Pharmacy Consult for coumadin and heparin Indication: DVT  No Known Allergies  Patient Measurements: Height: 5' 6.5" (168.9 cm) Weight: 182 lb 1.6 oz (82.6 kg) IBW/kg (Calculated) : 64.95  Vital Signs: Temp: 98.3 F (36.8 C) (09/28 0955) Temp src: Oral (09/28 0955) BP: 140/48 mmHg (09/28 0955) Pulse Rate: 64 (09/28 0955)  Labs:  Recent Labs  10/18/13 0600 10/19/13 0530 10/20/13 0511  HGB 10.7* 10.3* 10.7*  HCT 32.4* 31.1* 32.3*  PLT 213 293 300  LABPROT 16.4* 19.0* 19.7*  INR 1.32 1.59* 1.67*  HEPARINUNFRC 0.39 0.52 0.38  CREATININE 1.58* 1.52*  --     Estimated Creatinine Clearance: 40.8 ml/min (by C-G formula based on Cr of 1.52).   Medications:  Scheduled:  . amantadine  100 mg Per Tube QODAY  . amoxicillin  500 mg Oral 3 times per day  . antiseptic oral rinse  7 mL Mouth Rinse q12n4p  . bethanechol  10 mg Oral TID  . ciprofloxacin  250 mg Oral BID  . docusate  100 mg Per Tube BID  . free water  200 mL Per Tube 5 X Daily  . metoprolol tartrate  12.5 mg Oral BID  . pantoprazole sodium  40 mg Per Tube Daily  . sodium chloride  3 mL Intravenous Q12H  . warfarin  7.5 mg Oral ONCE-1800  . warfarin   Does not apply Once  . Warfarin - Pharmacist Dosing Inpatient   Does not apply q1800   Infusions:  . sodium chloride 30 mL/hr at 10/20/13 1159  . heparin 1,300 Units/hr (10/19/13 0517)    Assessment: 80 yo male with DVT is currently on subtherapeutic coumadin bridging with therapeutic heparin.  Day 6 of 5 of VTE overlap.  Heparin level today is 0.38 and INR is up to 1.67. Continues on cipro which may potentiate the INR. CBC is stable and no bleeding noted.   Goal of Therapy:  Heparin level 0.3-0.7 units/ml; INR 2-3 Monitor platelets by anticoagulation protocol: Yes   Plan:  - Continue heparin at 1300 units/hr - Warfarin 7.5 mg x 1 - Educated on warfarin 9/24  Azusena Erlandson, Rande Lawman 10/20/2013,12:07  PM

## 2013-10-21 LAB — CBC
HCT: 31.7 % — ABNORMAL LOW (ref 39.0–52.0)
Hemoglobin: 10.5 g/dL — ABNORMAL LOW (ref 13.0–17.0)
MCH: 29.8 pg (ref 26.0–34.0)
MCHC: 33.1 g/dL (ref 30.0–36.0)
MCV: 90.1 fL (ref 78.0–100.0)
Platelets: 290 10*3/uL (ref 150–400)
RBC: 3.52 MIL/uL — ABNORMAL LOW (ref 4.22–5.81)
RDW: 14.4 % (ref 11.5–15.5)
WBC: 7.2 10*3/uL (ref 4.0–10.5)

## 2013-10-21 LAB — PROTIME-INR
INR: 2.09 — ABNORMAL HIGH (ref 0.00–1.49)
Prothrombin Time: 23.5 seconds — ABNORMAL HIGH (ref 11.6–15.2)

## 2013-10-21 LAB — HEPARIN LEVEL (UNFRACTIONATED): Heparin Unfractionated: 0.25 IU/mL — ABNORMAL LOW (ref 0.30–0.70)

## 2013-10-21 MED ORDER — WARFARIN SODIUM 7.5 MG PO TABS: 7.5000 mg | ORAL_TABLET | Freq: Every day | ORAL | Status: DC

## 2013-10-21 MED ORDER — FOSFOMYCIN TROMETHAMINE 3 G PO PACK
3.0000 g | PACK | Freq: Once | ORAL | Status: AC
  Administered 2013-10-21: 3 g
  Filled 2013-10-21 (×2): qty 3

## 2013-10-21 MED ORDER — FOSFOMYCIN TROMETHAMINE 3 G PO PACK: 3.0000 g | PACK | Freq: Once | ORAL | Status: DC

## 2013-10-21 MED ORDER — WARFARIN SODIUM 5 MG PO TABS
5.0000 mg | ORAL_TABLET | Freq: Once | ORAL | Status: AC
  Administered 2013-10-21: 5 mg via ORAL
  Filled 2013-10-21: qty 1

## 2013-10-21 NOTE — Care Management Note (Signed)
CARE MANAGEMENT NOTE 10/21/2013  Patient:  MARGUERITE, MEDINE   Account Number:  [id number]  Date Initiated:  10/15/2013  Documentation initiated by:  Martice Doty  Subjective/Objective Assessment:   CM following for progression and d/c planning.     Action/Plan:   IM given on 10/15/2013 following for d/c needs and orders, spoke with pt and pt caregiver.   Anticipated DC Date:  10/21/2013   Anticipated DC Plan:  [place]         Choice offered to / List presented to:          [facility] arranged  HH-1 RN  [place].   Status of service:  Completed, signed off Medicare Important Message given?  YES (If response is "NO", the following Medicare IM given date fields will be blank) Date Medicare IM given:  10/15/2013 Medicare IM given by:  Tannis Burstein Date Additional Medicare IM given:  10/20/2013 Additional Medicare IM given by:  [facility]  Discharge Disposition:    Per UR Regulation:    If discussed at Long Length of Stay Meetings, dates discussed:    Comments:  10/21/2013 [facility] notified of plan to d/c to home with resumption of services and lab work to be drawn on Friday, Oct 24, 2013 and reported to pt PCP. Jasmine Pang RN MPH, case manager, [phone]

## 2013-10-21 NOTE — Progress Notes (Signed)
Patient Discharge:  Disposition: Patient is discharged to home via ambulance. Education: Patient and family educated on medications, prescriptions, follow-up appointments, discharge instructions, given hand out on Warfarin, both understood and acknowledged. IV: Peripheral IV removed before discharge. Transportation: Patient transported via ambulance. Belongings: Wife took all the belongings with her.
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
## 2013-10-22 ENCOUNTER — Telehealth: Payer: Self-pay | Admitting: *Deleted

## 2013-10-22 NOTE — Telephone Encounter (Signed)
Tried calling pt for TCM appt no answer LMOM RTC...Austin [name]

## 2013-10-22 NOTE — Telephone Encounter (Signed)
Transition Care Management Follow-up Telephone Call D/C from 10/21/13  How have you been since you were released from the hospital? Wife return call back she stated husband is little tired today. Both therapist has been out today.   Do you understand why you were in the hospital? YES, she stated yes they understood why he was admitted   Do you understand the discharge instrcutions? YES, we went over d/c summary  Items Reviewed:  Medications reviewed: YES, reviewed medications and he is taking  Allergies reviewed: YES, reviewed  Dietary changes reviewed: YES, heart healthy  Referrals reviewed: No referral needed   Functional Questionnaire:   Activities of Daily Living (ADLs):   She states he is independent in the following: shower and dressing himself, walking. She states st times she helps him when he is weak States he doesn't require assistance    Any transportation issues/concerns?: NO   Any patient concerns? NO   Confirmed importance and date/time of follow-up visits scheduled: YES, made appt for 10/31/13 with Dr. Jenny Reichmann   Confirmed with patient if condition begins to worsen call PCP or go to the ER.  Patient was given the Call-a-Nurse line [phone]: YES

## 2013-10-23 ENCOUNTER — Telehealth: Payer: Self-pay

## 2013-10-23 NOTE — Telephone Encounter (Signed)
Needs OV as I have never actually examined this pt

## 2013-10-23 NOTE — Telephone Encounter (Signed)
Swelling in left leg/ankle per [facility].  Pain is an 8 today and swelling increased since yesterday.

## 2013-10-24 ENCOUNTER — Telehealth: Payer: Self-pay | Admitting: Internal Medicine

## 2013-10-24 DIAGNOSIS — I82401 Acute embolism and thrombosis of unspecified deep veins of right lower extremity: Secondary | ICD-10-CM | POA: Diagnosis not present

## 2013-10-24 NOTE — Telephone Encounter (Signed)
Not sure why this result is sent to me since I did not order it  To continue coumadin as is  Robin to refer pt to coumadin clinic for f/u 1 wk

## 2013-10-24 NOTE — Telephone Encounter (Signed)
[facility] informed of MD instructions.

## 2013-10-24 NOTE — Telephone Encounter (Signed)
Called [facility] informed of MD instructions.

## 2013-10-24 NOTE — Telephone Encounter (Signed)
inr 2.20  pt 24.4 Pt taking 7.5 ml a day of coum

## 2013-10-24 NOTE — Telephone Encounter (Signed)
HHRN informed 

## 2013-10-29 ENCOUNTER — Encounter: Payer: Medicare Other | Admitting: Physical Medicine & Rehabilitation

## 2013-10-29 DIAGNOSIS — Z931 Gastrostomy status: Secondary | ICD-10-CM

## 2013-10-29 DIAGNOSIS — I10 Essential (primary) hypertension: Secondary | ICD-10-CM

## 2013-10-29 DIAGNOSIS — I69953 Hemiplegia and hemiparesis following unspecified cerebrovascular disease affecting right non-dominant side: Secondary | ICD-10-CM | POA: Diagnosis not present

## 2013-10-29 DIAGNOSIS — I6991 Cognitive deficits following unspecified cerebrovascular disease: Secondary | ICD-10-CM | POA: Diagnosis not present

## 2013-10-29 DIAGNOSIS — S069X9S Unspecified intracranial injury with loss of consciousness of unspecified duration, sequela: Secondary | ICD-10-CM | POA: Diagnosis not present

## 2013-10-29 DIAGNOSIS — R130 Aphagia: Secondary | ICD-10-CM | POA: Diagnosis not present

## 2013-10-31 ENCOUNTER — Encounter: Payer: Self-pay | Admitting: Internal Medicine

## 2013-10-31 ENCOUNTER — Other Ambulatory Visit (INDEPENDENT_AMBULATORY_CARE_PROVIDER_SITE_OTHER): Payer: Medicare Other

## 2013-10-31 ENCOUNTER — Ambulatory Visit (INDEPENDENT_AMBULATORY_CARE_PROVIDER_SITE_OTHER): Payer: Medicare Other | Admitting: Internal Medicine

## 2013-10-31 VITALS — BP 142/84 | HR 96 | Temp 98.3°F

## 2013-10-31 DIAGNOSIS — D62 Acute posthemorrhagic anemia: Secondary | ICD-10-CM | POA: Diagnosis not present

## 2013-10-31 DIAGNOSIS — R32 Unspecified urinary incontinence: Secondary | ICD-10-CM | POA: Insufficient documentation

## 2013-10-31 DIAGNOSIS — Z7901 Long term (current) use of anticoagulants: Secondary | ICD-10-CM

## 2013-10-31 DIAGNOSIS — S069X5S Unspecified intracranial injury with loss of consciousness greater than 24 hours with return to pre-existing conscious level, sequela: Secondary | ICD-10-CM

## 2013-10-31 LAB — CBC WITH DIFFERENTIAL/PLATELET
Basophils Absolute: 0.1 10*3/uL (ref 0.0–0.1)
Basophils Relative: 0.7 % (ref 0.0–3.0)
Eosinophils Absolute: 0.3 10*3/uL (ref 0.0–0.7)
Eosinophils Relative: 3.7 % (ref 0.0–5.0)
HCT: 34.7 % — ABNORMAL LOW (ref 39.0–52.0)
Hemoglobin: 11.6 g/dL — ABNORMAL LOW (ref 13.0–17.0)
Lymphocytes Relative: 21.3 % (ref 12.0–46.0)
Lymphs Abs: 1.9 10*3/uL (ref 0.7–4.0)
MCHC: 33.4 g/dL (ref 30.0–36.0)
MCV: 89.3 fl (ref 78.0–100.0)
Monocytes Absolute: 0.9 10*3/uL (ref 0.1–1.0)
Monocytes Relative: 9.5 % (ref 3.0–12.0)
Neutro Abs: 5.9 10*3/uL (ref 1.4–7.7)
Neutrophils Relative %: 64.8 % (ref 43.0–77.0)
Platelets: 287 10*3/uL (ref 150.0–400.0)
RBC: 3.88 Mil/uL — ABNORMAL LOW (ref 4.22–5.81)
RDW: 15.2 % (ref 11.5–15.5)
WBC: 9.1 10*3/uL (ref 4.0–10.5)

## 2013-10-31 LAB — PROTIME-INR
INR: 4.3 ratio — ABNORMAL HIGH (ref 0.8–1.0)
Prothrombin Time: 46.3 s — ABNORMAL HIGH (ref 9.6–13.1)

## 2013-10-31 NOTE — Patient Instructions (Signed)
Please continue all other medications as before, and refills have been done if requested.  Please have the pharmacy call with any other refills you may need.  Please continue your efforts at being more active, low cholesterol diet, and weight control.  You are otherwise up to date with prevention measures today.  Please keep your appointments with your specialists as you may have planned  You are given the order for the blood work for next week  Please go to the LAB in the Basement (turn left off the elevator) for the tests to be done today  You will be contacted by phone if any changes need to be made immediately.  Otherwise, you will receive a letter about your results with an explanation, but please check with MyChart first.  Please remember to sign up for MyChart if you have not done so, as this will be important to you in the future with finding out test results, communicating by private email, and scheduling acute appointments online when needed.  Please return in 3 months, or sooner if needed

## 2013-10-31 NOTE — Progress Notes (Signed)
Pre visit review using our clinic review tool, if applicable. No additional management support is needed unless otherwise documented below in the visit note. 
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
## 2013-11-01 NOTE — Addendum Note (Signed)
Addended by: [name] on: 11/01/2013 04:05 PM   Modules accepted: Level of Service

## 2013-11-01 NOTE — Assessment & Plan Note (Signed)
Chronic recurrent o/w asympt, but did have nocturia increased last PM, exam ok, will asks for IandO cath UA per [facility] next able

## 2013-11-01 NOTE — Assessment & Plan Note (Signed)
Also for INR today, with planned f/u at 1 wk as well per home care/[facility] (gave rx with order to family)

## 2013-11-01 NOTE — Assessment & Plan Note (Signed)
stable overall by history and exam, recent data reviewed with pt, and pt to continue medical treatment as before,  to f/u any worsening symptoms or concerns, to cont the amantadine

## 2013-11-01 NOTE — Assessment & Plan Note (Signed)
No overt bleeding, for f/u cbc today and 1 wk with next INR

## 2013-11-05 ENCOUNTER — Telehealth: Payer: Self-pay | Admitting: *Deleted

## 2013-11-05 MED ORDER — WARFARIN SODIUM 5 MG PO TABS: 5.0000 mg | ORAL_TABLET | Freq: Every day | ORAL | Status: DC

## 2013-11-05 NOTE — Telephone Encounter (Signed)
Wife called and stated that rx for coumadin was not sent to pharmacy. Inform wife md was going to send coumadin 5 mg tab will resend to [facility]...[name]

## 2013-11-05 NOTE — Telephone Encounter (Signed)
Austin [name] would like a swallow study done on this patient.  Please advise

## 2013-11-06 ENCOUNTER — Other Ambulatory Visit: Payer: Self-pay | Admitting: Physical Medicine & Rehabilitation

## 2013-11-06 DIAGNOSIS — R1310 Dysphagia, unspecified: Secondary | ICD-10-CM

## 2013-11-06 NOTE — Telephone Encounter (Signed)
Please proceed with MBS, thank you

## 2013-11-07 ENCOUNTER — Ambulatory Visit (INDEPENDENT_AMBULATORY_CARE_PROVIDER_SITE_OTHER): Payer: Medicare Other | Admitting: *Deleted

## 2013-11-07 ENCOUNTER — Telehealth: Payer: Self-pay | Admitting: *Deleted

## 2013-11-07 LAB — POCT INR: INR: 1.9

## 2013-11-07 NOTE — Telephone Encounter (Signed)
Verbal ok given to extend  ST visits per protocol

## 2013-11-07 NOTE — Telephone Encounter (Signed)
Notified Bethena Roys

## 2013-11-11 ENCOUNTER — Encounter: Payer: Self-pay | Admitting: Internal Medicine

## 2013-11-11 ENCOUNTER — Ambulatory Visit (HOSPITAL_COMMUNITY)
Admission: RE | Admit: 2013-11-11 | Discharge: 2013-11-11 | Disposition: A | Discharge status: Home / Self Care | Payer: No Typology Code available for payment source | Source: Ambulatory Visit | Attending: Internal Medicine | Admitting: Internal Medicine

## 2013-11-11 ENCOUNTER — Ambulatory Visit (HOSPITAL_COMMUNITY)
Admission: RE | Admit: 2013-11-11 | Discharge: 2013-11-11 | Disposition: A | Discharge status: Home / Self Care | Payer: Medicare Other | Source: Ambulatory Visit | Attending: Physical Medicine & Rehabilitation | Admitting: Physical Medicine & Rehabilitation

## 2013-11-11 ENCOUNTER — Encounter (HOSPITAL_COMMUNITY): Payer: Medicare Other | Admitting: Speech Pathology

## 2013-11-11 ENCOUNTER — Other Ambulatory Visit: Payer: Self-pay | Admitting: Physical Medicine & Rehabilitation

## 2013-11-11 DIAGNOSIS — Z5189 Encounter for other specified aftercare: Secondary | ICD-10-CM | POA: Insufficient documentation

## 2013-11-11 DIAGNOSIS — R1311 Dysphagia, oral phase: Secondary | ICD-10-CM | POA: Insufficient documentation

## 2013-11-11 DIAGNOSIS — R1314 Dysphagia, pharyngoesophageal phase: Secondary | ICD-10-CM | POA: Insufficient documentation

## 2013-11-11 DIAGNOSIS — R1313 Dysphagia, pharyngeal phase: Secondary | ICD-10-CM | POA: Insufficient documentation

## 2013-11-11 DIAGNOSIS — I10 Essential (primary) hypertension: Secondary | ICD-10-CM | POA: Diagnosis not present

## 2013-11-11 DIAGNOSIS — R1310 Dysphagia, unspecified: Secondary | ICD-10-CM
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
## 2013-11-13 ENCOUNTER — Other Ambulatory Visit: Payer: Self-pay | Admitting: Physical Medicine & Rehabilitation

## 2013-11-13 DIAGNOSIS — R1310 Dysphagia, unspecified: Secondary | ICD-10-CM

## 2013-11-14 ENCOUNTER — Ambulatory Visit (INDEPENDENT_AMBULATORY_CARE_PROVIDER_SITE_OTHER): Payer: Medicare Other | Admitting: *Deleted

## 2013-11-14 DIAGNOSIS — R32 Unspecified urinary incontinence: Secondary | ICD-10-CM | POA: Diagnosis not present

## 2013-11-14 DIAGNOSIS — Z7901 Long term (current) use of anticoagulants: Secondary | ICD-10-CM

## 2013-11-14 DIAGNOSIS — D62 Acute posthemorrhagic anemia: Secondary | ICD-10-CM | POA: Diagnosis not present

## 2013-11-14 LAB — POCT INR: INR: 2.8

## 2013-11-18 ENCOUNTER — Telehealth: Payer: Self-pay | Admitting: Internal Medicine

## 2013-11-18 ENCOUNTER — Encounter (HOSPITAL_COMMUNITY): Payer: Medicare Other | Admitting: Speech Pathology

## 2013-11-18 NOTE — Telephone Encounter (Signed)
I received results from UA and Culture done oct 23, which were essentially neg  Austin [name] to contact family, has he been having worsening urinary symptoms since then, fever or other such as cough, sob (anything different),  By just the urine cx results, it would not seem to need tx

## 2013-11-18 NOTE — Telephone Encounter (Signed)
Spoke to the patients wife informed of results and all MD instructions.  She stated the patient is doing very well at this time.

## 2013-11-19 DIAGNOSIS — R32 Unspecified urinary incontinence: Secondary | ICD-10-CM

## 2013-11-19 DIAGNOSIS — Z7901 Long term (current) use of anticoagulants: Secondary | ICD-10-CM | POA: Diagnosis not present

## 2013-11-19 DIAGNOSIS — D62 Acute posthemorrhagic anemia: Secondary | ICD-10-CM

## 2013-11-19 DIAGNOSIS — S069X5S Unspecified intracranial injury with loss of consciousness greater than 24 hours with return to pre-existing conscious level, sequela: Secondary | ICD-10-CM | POA: Diagnosis not present

## 2013-11-20 ENCOUNTER — Encounter: Payer: Self-pay | Admitting: Internal Medicine

## 2013-11-25 ENCOUNTER — Telehealth: Payer: Self-pay

## 2013-11-25 NOTE — Telephone Encounter (Signed)
OK to forward to providers involved in recent Swallowing study, which is still abnormal

## 2013-11-25 NOTE — Telephone Encounter (Signed)
[facility] speech therapist has been seeing the patient.  He has a PEG tube, is now on a soft diet and taking meds. By mouth.  HHRN thinks ok for tube to be removed.  Advise if appointment with PCP or other to remove tube.  Call back for [facility] [phone] or patients home [phone]

## 2013-11-26 ENCOUNTER — Telehealth: Payer: Self-pay | Admitting: *Deleted

## 2013-11-26 NOTE — Telephone Encounter (Signed)
Denzil Hughes ST called about Mr Koller.  He had a swallow study a few weeks ago and they upgraded him to thin liquids mechanical soft diet, meds by mouth.  He is not using the peg and they are just flushing it with water to keep patent.  She noticed drainage around the tube. No odor or obvious sign of infection.  Bethena Roys is wondering about having it removed and who would order that. Please advise. (he has appt coming up on 12/01/13)

## 2013-11-26 NOTE — Telephone Encounter (Signed)
HHRN informed 

## 2013-11-27 ENCOUNTER — Telehealth: Payer: Self-pay | Admitting: *Deleted

## 2013-11-27 NOTE — Telephone Encounter (Signed)
Ok for verbal for all 
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
## 2013-11-28 NOTE — Telephone Encounter (Signed)
Called wanda no answer LMOM with md response...Austin [name]
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
## 2013-12-01 ENCOUNTER — Encounter: Payer: Medicare Other | Attending: Physical Medicine & Rehabilitation | Admitting: Physical Medicine & Rehabilitation

## 2013-12-01 ENCOUNTER — Encounter: Payer: Self-pay | Admitting: Physical Medicine & Rehabilitation

## 2013-12-01 VITALS — BP 142/84 | HR 78 | Resp 14 | Ht 65.5 in | Wt 183.0 lb

## 2013-12-01 DIAGNOSIS — R1310 Dysphagia, unspecified: Secondary | ICD-10-CM | POA: Diagnosis not present

## 2013-12-01 DIAGNOSIS — R1314 Dysphagia, pharyngoesophageal phase: Secondary | ICD-10-CM

## 2013-12-01 DIAGNOSIS — G8110 Spastic hemiplegia affecting unspecified side: Secondary | ICD-10-CM | POA: Diagnosis not present

## 2013-12-01 DIAGNOSIS — S06890S Other specified intracranial injury without loss of consciousness, sequela: Secondary | ICD-10-CM | POA: Diagnosis not present

## 2013-12-01 DIAGNOSIS — W3400XS Accidental discharge from unspecified firearms or gun, sequela: Secondary | ICD-10-CM | POA: Diagnosis not present

## 2013-12-01 DIAGNOSIS — M62838 Other muscle spasm: Secondary | ICD-10-CM | POA: Insufficient documentation

## 2013-12-01 DIAGNOSIS — S069X4S Unspecified intracranial injury with loss of consciousness of 6 hours to 24 hours, sequela: Secondary | ICD-10-CM

## 2013-12-01 DIAGNOSIS — G8114 Spastic hemiplegia affecting left nondominant side: Secondary | ICD-10-CM | POA: Insufficient documentation
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
## 2013-12-01 NOTE — Patient Instructions (Signed)
USE DRY DRESSING DAILY FOR STOMACH WOUND.   IF PINK/MOIST TISSUE RE-APPEARS AROUND WOUND, RE-APPLY THE SILVER NITRATE SWAB AS I SHOWED YOU (BY ROLLING OVER THE TISSUE)

## 2013-12-03 ENCOUNTER — Telehealth: Payer: Self-pay

## 2013-12-03 NOTE — Telephone Encounter (Signed)
Chrys Racer from [facility] calling for new Coumadin orders.    INR 1.1  (12/03/13)  Last INR:  2.8 (11/14/13)  Current dose:  Coumadin 5 mg every day.    Please advise.

## 2013-12-03 NOTE — Telephone Encounter (Signed)
Austin [name] with [facility] notified of Dr. Gwynn Burly orders.  VORB.  Encouraged to call back with questions.

## 2013-12-03 NOTE — Telephone Encounter (Signed)
Ok for coumadin 10 mg Total x 1 today, then change to 7.5 qd; with f/u INR in 1 week

## 2013-12-04 DIAGNOSIS — R4189 Other symptoms and signs involving cognitive functions and awareness: Secondary | ICD-10-CM | POA: Diagnosis not present

## 2013-12-04 DIAGNOSIS — I10 Essential (primary) hypertension: Secondary | ICD-10-CM | POA: Diagnosis not present

## 2013-12-04 DIAGNOSIS — I82403 Acute embolism and thrombosis of unspecified deep veins of lower extremity, bilateral: Secondary | ICD-10-CM | POA: Diagnosis not present

## 2013-12-04 DIAGNOSIS — Z7901 Long term (current) use of anticoagulants: Secondary | ICD-10-CM | POA: Diagnosis not present

## 2013-12-04 DIAGNOSIS — G8192 Hemiplegia, unspecified affecting left dominant side: Secondary | ICD-10-CM | POA: Diagnosis not present

## 2013-12-04 DIAGNOSIS — Z5181 Encounter for therapeutic drug level monitoring: Secondary | ICD-10-CM | POA: Diagnosis not present

## 2013-12-04 DIAGNOSIS — Z95828 Presence of other vascular implants and grafts: Secondary | ICD-10-CM | POA: Diagnosis not present

## 2013-12-04 DIAGNOSIS — S069X9S Unspecified intracranial injury with loss of consciousness of unspecified duration, sequela: Secondary | ICD-10-CM | POA: Diagnosis not present

## 2013-12-04 DIAGNOSIS — R1312 Dysphagia, oropharyngeal phase: Secondary | ICD-10-CM | POA: Diagnosis not present

## 2013-12-05 ENCOUNTER — Telehealth: Payer: Self-pay

## 2013-12-05 DIAGNOSIS — R4189 Other symptoms and signs involving cognitive functions and awareness: Secondary | ICD-10-CM | POA: Diagnosis not present

## 2013-12-05 DIAGNOSIS — G8192 Hemiplegia, unspecified affecting left dominant side: Secondary | ICD-10-CM | POA: Diagnosis not present

## 2013-12-05 DIAGNOSIS — S069X9S Unspecified intracranial injury with loss of consciousness of unspecified duration, sequela: Secondary | ICD-10-CM | POA: Diagnosis not present

## 2013-12-05 DIAGNOSIS — I10 Essential (primary) hypertension: Secondary | ICD-10-CM | POA: Diagnosis not present

## 2013-12-05 DIAGNOSIS — I82403 Acute embolism and thrombosis of unspecified deep veins of lower extremity, bilateral: Secondary | ICD-10-CM | POA: Diagnosis not present

## 2013-12-05 DIAGNOSIS — R1312 Dysphagia, oropharyngeal phase: Secondary | ICD-10-CM | POA: Diagnosis not present

## 2013-12-05 NOTE — Telephone Encounter (Signed)
HHRN informed 

## 2013-12-05 NOTE — Telephone Encounter (Signed)
HHRN PT with [facility] Erline Levine would like a verbal to continue for 9 weeks twice weekly.  [id number]

## 2013-12-05 NOTE — Telephone Encounter (Signed)
Ok for verbal 

## 2013-12-08 DIAGNOSIS — R1312 Dysphagia, oropharyngeal phase: Secondary | ICD-10-CM | POA: Diagnosis not present

## 2013-12-08 DIAGNOSIS — I82403 Acute embolism and thrombosis of unspecified deep veins of lower extremity, bilateral: Secondary | ICD-10-CM | POA: Diagnosis not present

## 2013-12-08 DIAGNOSIS — S069X9S Unspecified intracranial injury with loss of consciousness of unspecified duration, sequela: Secondary | ICD-10-CM | POA: Diagnosis not present

## 2013-12-08 DIAGNOSIS — G8192 Hemiplegia, unspecified affecting left dominant side: Secondary | ICD-10-CM | POA: Diagnosis not present

## 2013-12-08 DIAGNOSIS — I10 Essential (primary) hypertension: Secondary | ICD-10-CM | POA: Diagnosis not present

## 2013-12-08 DIAGNOSIS — R4189 Other symptoms and signs involving cognitive functions and awareness: Secondary | ICD-10-CM | POA: Diagnosis not present

## 2013-12-09 DIAGNOSIS — I10 Essential (primary) hypertension: Secondary | ICD-10-CM | POA: Diagnosis not present

## 2013-12-09 DIAGNOSIS — I82403 Acute embolism and thrombosis of unspecified deep veins of lower extremity, bilateral: Secondary | ICD-10-CM | POA: Diagnosis not present

## 2013-12-09 DIAGNOSIS — S069X9S Unspecified intracranial injury with loss of consciousness of unspecified duration, sequela: Secondary | ICD-10-CM | POA: Diagnosis not present

## 2013-12-09 DIAGNOSIS — R1312 Dysphagia, oropharyngeal phase: Secondary | ICD-10-CM | POA: Diagnosis not present

## 2013-12-09 DIAGNOSIS — G8192 Hemiplegia, unspecified affecting left dominant side: Secondary | ICD-10-CM | POA: Diagnosis not present

## 2013-12-09 DIAGNOSIS — R4189 Other symptoms and signs involving cognitive functions and awareness: Secondary | ICD-10-CM | POA: Diagnosis not present

## 2013-12-10 ENCOUNTER — Telehealth: Payer: Self-pay | Admitting: *Deleted

## 2013-12-10 ENCOUNTER — Ambulatory Visit (INDEPENDENT_AMBULATORY_CARE_PROVIDER_SITE_OTHER): Payer: Medicare Other

## 2013-12-10 ENCOUNTER — Telehealth: Payer: Self-pay | Admitting: Family

## 2013-12-10 DIAGNOSIS — Z7901 Long term (current) use of anticoagulants: Secondary | ICD-10-CM | POA: Diagnosis not present

## 2013-12-10 DIAGNOSIS — R4189 Other symptoms and signs involving cognitive functions and awareness: Secondary | ICD-10-CM | POA: Diagnosis not present

## 2013-12-10 DIAGNOSIS — I10 Essential (primary) hypertension: Secondary | ICD-10-CM | POA: Diagnosis not present

## 2013-12-10 DIAGNOSIS — S069X9S Unspecified intracranial injury with loss of consciousness of unspecified duration, sequela: Secondary | ICD-10-CM | POA: Diagnosis not present

## 2013-12-10 DIAGNOSIS — G8114 Spastic hemiplegia affecting left nondominant side: Secondary | ICD-10-CM

## 2013-12-10 DIAGNOSIS — R1312 Dysphagia, oropharyngeal phase: Secondary | ICD-10-CM | POA: Diagnosis not present

## 2013-12-10 DIAGNOSIS — G8192 Hemiplegia, unspecified affecting left dominant side: Secondary | ICD-10-CM | POA: Diagnosis not present

## 2013-12-10 DIAGNOSIS — I82403 Acute embolism and thrombosis of unspecified deep veins of lower extremity, bilateral: Secondary | ICD-10-CM | POA: Diagnosis not present

## 2013-12-10 LAB — POCT INR: INR: 2.6

## 2013-12-10 NOTE — Telephone Encounter (Signed)
Wife calling, need a written script for a neuromuscular electrical stem

## 2013-12-10 NOTE — Telephone Encounter (Signed)
Agree with plan 

## 2013-12-10 NOTE — Telephone Encounter (Signed)
Need more information than that to rx. Where are we applying, how often, what unit, etc?

## 2013-12-11 DIAGNOSIS — R1312 Dysphagia, oropharyngeal phase: Secondary | ICD-10-CM | POA: Diagnosis not present

## 2013-12-11 DIAGNOSIS — I10 Essential (primary) hypertension: Secondary | ICD-10-CM | POA: Diagnosis not present

## 2013-12-11 DIAGNOSIS — G8192 Hemiplegia, unspecified affecting left dominant side: Secondary | ICD-10-CM | POA: Diagnosis not present

## 2013-12-11 DIAGNOSIS — I82403 Acute embolism and thrombosis of unspecified deep veins of lower extremity, bilateral: Secondary | ICD-10-CM | POA: Diagnosis not present

## 2013-12-11 DIAGNOSIS — R4189 Other symptoms and signs involving cognitive functions and awareness: Secondary | ICD-10-CM | POA: Diagnosis not present

## 2013-12-11 DIAGNOSIS — S069X9S Unspecified intracranial injury with loss of consciousness of unspecified duration, sequela: Secondary | ICD-10-CM | POA: Diagnosis not present

## 2013-12-12 DIAGNOSIS — R1312 Dysphagia, oropharyngeal phase: Secondary | ICD-10-CM | POA: Diagnosis not present

## 2013-12-12 DIAGNOSIS — S069X9S Unspecified intracranial injury with loss of consciousness of unspecified duration, sequela: Secondary | ICD-10-CM | POA: Diagnosis not present

## 2013-12-12 DIAGNOSIS — R4189 Other symptoms and signs involving cognitive functions and awareness: Secondary | ICD-10-CM | POA: Diagnosis not present

## 2013-12-12 DIAGNOSIS — G8192 Hemiplegia, unspecified affecting left dominant side: Secondary | ICD-10-CM | POA: Diagnosis not present

## 2013-12-12 DIAGNOSIS — I10 Essential (primary) hypertension: Secondary | ICD-10-CM | POA: Diagnosis not present

## 2013-12-12 DIAGNOSIS — I82403 Acute embolism and thrombosis of unspecified deep veins of lower extremity, bilateral: Secondary | ICD-10-CM | POA: Diagnosis not present

## 2013-12-12 NOTE — Telephone Encounter (Signed)
Austin [name], Occupational Therapist, [facility], has now called requesting authorization to provide electrical stimulation, Left Upper Extremity, requesting written orders only Fax#  [phone] Attn: Gwynne Edinger Chrismon

## 2013-12-15 DIAGNOSIS — R1312 Dysphagia, oropharyngeal phase: Secondary | ICD-10-CM | POA: Diagnosis not present

## 2013-12-15 DIAGNOSIS — I10 Essential (primary) hypertension: Secondary | ICD-10-CM | POA: Diagnosis not present

## 2013-12-15 DIAGNOSIS — R4189 Other symptoms and signs involving cognitive functions and awareness: Secondary | ICD-10-CM | POA: Diagnosis not present

## 2013-12-15 DIAGNOSIS — G8192 Hemiplegia, unspecified affecting left dominant side: Secondary | ICD-10-CM | POA: Diagnosis not present

## 2013-12-15 DIAGNOSIS — S069X9S Unspecified intracranial injury with loss of consciousness of unspecified duration, sequela: Secondary | ICD-10-CM | POA: Diagnosis not present

## 2013-12-15 DIAGNOSIS — I82403 Acute embolism and thrombosis of unspecified deep veins of lower extremity, bilateral: Secondary | ICD-10-CM | POA: Diagnosis not present

## 2013-12-15 NOTE — Telephone Encounter (Signed)
Ok

## 2013-12-16 DIAGNOSIS — S069X9S Unspecified intracranial injury with loss of consciousness of unspecified duration, sequela: Secondary | ICD-10-CM | POA: Diagnosis not present

## 2013-12-16 DIAGNOSIS — G8192 Hemiplegia, unspecified affecting left dominant side: Secondary | ICD-10-CM | POA: Diagnosis not present

## 2013-12-16 DIAGNOSIS — R1312 Dysphagia, oropharyngeal phase: Secondary | ICD-10-CM | POA: Diagnosis not present

## 2013-12-16 DIAGNOSIS — I10 Essential (primary) hypertension: Secondary | ICD-10-CM | POA: Diagnosis not present

## 2013-12-16 DIAGNOSIS — R4189 Other symptoms and signs involving cognitive functions and awareness: Secondary | ICD-10-CM | POA: Diagnosis not present

## 2013-12-16 DIAGNOSIS — I82403 Acute embolism and thrombosis of unspecified deep veins of lower extremity, bilateral: Secondary | ICD-10-CM | POA: Diagnosis not present

## 2013-12-17 DIAGNOSIS — G8192 Hemiplegia, unspecified affecting left dominant side: Secondary | ICD-10-CM | POA: Diagnosis not present

## 2013-12-17 DIAGNOSIS — R1312 Dysphagia, oropharyngeal phase: Secondary | ICD-10-CM | POA: Diagnosis not present

## 2013-12-17 DIAGNOSIS — R4189 Other symptoms and signs involving cognitive functions and awareness: Secondary | ICD-10-CM | POA: Diagnosis not present

## 2013-12-17 DIAGNOSIS — S069X9S Unspecified intracranial injury with loss of consciousness of unspecified duration, sequela: Secondary | ICD-10-CM | POA: Diagnosis not present

## 2013-12-17 DIAGNOSIS — I82403 Acute embolism and thrombosis of unspecified deep veins of lower extremity, bilateral: Secondary | ICD-10-CM | POA: Diagnosis not present

## 2013-12-17 DIAGNOSIS — I10 Essential (primary) hypertension: Secondary | ICD-10-CM | POA: Diagnosis not present

## 2013-12-19 DIAGNOSIS — R1312 Dysphagia, oropharyngeal phase: Secondary | ICD-10-CM | POA: Diagnosis not present

## 2013-12-19 DIAGNOSIS — R4189 Other symptoms and signs involving cognitive functions and awareness: Secondary | ICD-10-CM | POA: Diagnosis not present

## 2013-12-19 DIAGNOSIS — I10 Essential (primary) hypertension: Secondary | ICD-10-CM | POA: Diagnosis not present

## 2013-12-19 DIAGNOSIS — I82403 Acute embolism and thrombosis of unspecified deep veins of lower extremity, bilateral: Secondary | ICD-10-CM | POA: Diagnosis not present

## 2013-12-19 DIAGNOSIS — G8192 Hemiplegia, unspecified affecting left dominant side: Secondary | ICD-10-CM | POA: Diagnosis not present

## 2013-12-19 DIAGNOSIS — S069X9S Unspecified intracranial injury with loss of consciousness of unspecified duration, sequela: Secondary | ICD-10-CM | POA: Diagnosis not present

## 2013-12-22 DIAGNOSIS — R4189 Other symptoms and signs involving cognitive functions and awareness: Secondary | ICD-10-CM | POA: Diagnosis not present

## 2013-12-22 DIAGNOSIS — R1312 Dysphagia, oropharyngeal phase: Secondary | ICD-10-CM | POA: Diagnosis not present

## 2013-12-22 DIAGNOSIS — I10 Essential (primary) hypertension: Secondary | ICD-10-CM | POA: Diagnosis not present

## 2013-12-22 DIAGNOSIS — I82403 Acute embolism and thrombosis of unspecified deep veins of lower extremity, bilateral: Secondary | ICD-10-CM | POA: Diagnosis not present

## 2013-12-22 DIAGNOSIS — S069X9S Unspecified intracranial injury with loss of consciousness of unspecified duration, sequela: Secondary | ICD-10-CM | POA: Diagnosis not present

## 2013-12-22 DIAGNOSIS — G8192 Hemiplegia, unspecified affecting left dominant side: Secondary | ICD-10-CM | POA: Diagnosis not present

## 2013-12-23 DIAGNOSIS — R1312 Dysphagia, oropharyngeal phase: Secondary | ICD-10-CM | POA: Diagnosis not present

## 2013-12-23 DIAGNOSIS — I82403 Acute embolism and thrombosis of unspecified deep veins of lower extremity, bilateral: Secondary | ICD-10-CM | POA: Diagnosis not present

## 2013-12-23 DIAGNOSIS — I10 Essential (primary) hypertension: Secondary | ICD-10-CM | POA: Diagnosis not present

## 2013-12-23 DIAGNOSIS — G8192 Hemiplegia, unspecified affecting left dominant side: Secondary | ICD-10-CM | POA: Diagnosis not present

## 2013-12-23 DIAGNOSIS — R4189 Other symptoms and signs involving cognitive functions and awareness: Secondary | ICD-10-CM | POA: Diagnosis not present

## 2013-12-23 DIAGNOSIS — S069X9S Unspecified intracranial injury with loss of consciousness of unspecified duration, sequela: Secondary | ICD-10-CM | POA: Diagnosis not present

## 2013-12-24 DIAGNOSIS — G8192 Hemiplegia, unspecified affecting left dominant side: Secondary | ICD-10-CM | POA: Diagnosis not present

## 2013-12-24 DIAGNOSIS — R4189 Other symptoms and signs involving cognitive functions and awareness: Secondary | ICD-10-CM | POA: Diagnosis not present

## 2013-12-24 DIAGNOSIS — I82403 Acute embolism and thrombosis of unspecified deep veins of lower extremity, bilateral: Secondary | ICD-10-CM | POA: Diagnosis not present

## 2013-12-24 DIAGNOSIS — R1312 Dysphagia, oropharyngeal phase: Secondary | ICD-10-CM | POA: Diagnosis not present

## 2013-12-24 DIAGNOSIS — I10 Essential (primary) hypertension: Secondary | ICD-10-CM | POA: Diagnosis not present

## 2013-12-24 DIAGNOSIS — S069X9S Unspecified intracranial injury with loss of consciousness of unspecified duration, sequela: Secondary | ICD-10-CM | POA: Diagnosis not present

## 2013-12-25 DIAGNOSIS — R4189 Other symptoms and signs involving cognitive functions and awareness: Secondary | ICD-10-CM | POA: Diagnosis not present

## 2013-12-25 DIAGNOSIS — G8192 Hemiplegia, unspecified affecting left dominant side: Secondary | ICD-10-CM | POA: Diagnosis not present

## 2013-12-25 DIAGNOSIS — S069X9S Unspecified intracranial injury with loss of consciousness of unspecified duration, sequela: Secondary | ICD-10-CM | POA: Diagnosis not present

## 2013-12-25 DIAGNOSIS — R1312 Dysphagia, oropharyngeal phase: Secondary | ICD-10-CM | POA: Diagnosis not present

## 2013-12-25 DIAGNOSIS — I10 Essential (primary) hypertension: Secondary | ICD-10-CM | POA: Diagnosis not present

## 2013-12-25 DIAGNOSIS — I82403 Acute embolism and thrombosis of unspecified deep veins of lower extremity, bilateral: Secondary | ICD-10-CM | POA: Diagnosis not present

## 2013-12-30 DIAGNOSIS — S069X9S Unspecified intracranial injury with loss of consciousness of unspecified duration, sequela: Secondary | ICD-10-CM | POA: Diagnosis not present

## 2013-12-30 DIAGNOSIS — R4189 Other symptoms and signs involving cognitive functions and awareness: Secondary | ICD-10-CM | POA: Diagnosis not present

## 2013-12-30 DIAGNOSIS — I82403 Acute embolism and thrombosis of unspecified deep veins of lower extremity, bilateral: Secondary | ICD-10-CM | POA: Diagnosis not present

## 2013-12-30 DIAGNOSIS — R1312 Dysphagia, oropharyngeal phase: Secondary | ICD-10-CM | POA: Diagnosis not present

## 2013-12-30 DIAGNOSIS — I10 Essential (primary) hypertension: Secondary | ICD-10-CM | POA: Diagnosis not present

## 2013-12-30 DIAGNOSIS — G8192 Hemiplegia, unspecified affecting left dominant side: Secondary | ICD-10-CM | POA: Diagnosis not present

## 2013-12-31 ENCOUNTER — Ambulatory Visit (INDEPENDENT_AMBULATORY_CARE_PROVIDER_SITE_OTHER): Payer: Medicare Other

## 2013-12-31 DIAGNOSIS — R4189 Other symptoms and signs involving cognitive functions and awareness: Secondary | ICD-10-CM | POA: Diagnosis not present

## 2013-12-31 DIAGNOSIS — R1312 Dysphagia, oropharyngeal phase: Secondary | ICD-10-CM | POA: Diagnosis not present

## 2013-12-31 DIAGNOSIS — I82403 Acute embolism and thrombosis of unspecified deep veins of lower extremity, bilateral: Secondary | ICD-10-CM | POA: Diagnosis not present

## 2013-12-31 DIAGNOSIS — Z5181 Encounter for therapeutic drug level monitoring: Secondary | ICD-10-CM | POA: Diagnosis not present

## 2013-12-31 DIAGNOSIS — G8192 Hemiplegia, unspecified affecting left dominant side: Secondary | ICD-10-CM | POA: Diagnosis not present

## 2013-12-31 DIAGNOSIS — Z7901 Long term (current) use of anticoagulants: Secondary | ICD-10-CM

## 2013-12-31 DIAGNOSIS — S069X9S Unspecified intracranial injury with loss of consciousness of unspecified duration, sequela: Secondary | ICD-10-CM | POA: Diagnosis not present

## 2013-12-31 DIAGNOSIS — I10 Essential (primary) hypertension: Secondary | ICD-10-CM | POA: Diagnosis not present

## 2013-12-31 LAB — POCT INR: INR: 3.6

## 2013-12-31 MED ORDER — WARFARIN SODIUM 5 MG PO TABS: 5.0000 mg | ORAL_TABLET | Freq: Every day | ORAL | Status: DC

## 2013-12-31 NOTE — Progress Notes (Signed)
Recommendations called into [facility] who stated that she would report the same to the patient.   Medication refilled by Terri Piedra, NP.

## 2013-12-31 NOTE — Telephone Encounter (Signed)
There is a note to change to the 5 mg - so am I ordering the 5 mg or 7.5 mg tablets?

## 2013-12-31 NOTE — Progress Notes (Signed)
[name] from [facility] called to report patient's INR.  INR: 3.6.  Currently taking 7.5 mg (1 tablet) every day.  Pt also needs a refill.

## 2013-12-31 NOTE — Telephone Encounter (Signed)
Yes please. Thanks. 

## 2013-12-31 NOTE — Telephone Encounter (Signed)
Agree with plan. Do I need to provide the refills?

## 2013-12-31 NOTE — Telephone Encounter (Signed)
Pt needs a refill on the 5 mg tablets.  Adjusted tracker to reflect current changes.

## 2013-12-31 NOTE — Telephone Encounter (Signed)
Medication refilled

## 2014-01-01 DIAGNOSIS — G8192 Hemiplegia, unspecified affecting left dominant side: Secondary | ICD-10-CM | POA: Diagnosis not present

## 2014-01-01 DIAGNOSIS — I82403 Acute embolism and thrombosis of unspecified deep veins of lower extremity, bilateral: Secondary | ICD-10-CM | POA: Diagnosis not present

## 2014-01-01 DIAGNOSIS — R1312 Dysphagia, oropharyngeal phase: Secondary | ICD-10-CM | POA: Diagnosis not present

## 2014-01-01 DIAGNOSIS — I10 Essential (primary) hypertension: Secondary | ICD-10-CM | POA: Diagnosis not present

## 2014-01-01 DIAGNOSIS — S069X9S Unspecified intracranial injury with loss of consciousness of unspecified duration, sequela: Secondary | ICD-10-CM | POA: Diagnosis not present

## 2014-01-01 DIAGNOSIS — R4189 Other symptoms and signs involving cognitive functions and awareness: Secondary | ICD-10-CM | POA: Diagnosis not present

## 2014-01-02 DIAGNOSIS — R1312 Dysphagia, oropharyngeal phase: Secondary | ICD-10-CM | POA: Diagnosis not present

## 2014-01-02 DIAGNOSIS — S069X9S Unspecified intracranial injury with loss of consciousness of unspecified duration, sequela: Secondary | ICD-10-CM | POA: Diagnosis not present

## 2014-01-02 DIAGNOSIS — I82403 Acute embolism and thrombosis of unspecified deep veins of lower extremity, bilateral: Secondary | ICD-10-CM | POA: Diagnosis not present

## 2014-01-02 DIAGNOSIS — R4189 Other symptoms and signs involving cognitive functions and awareness: Secondary | ICD-10-CM | POA: Diagnosis not present

## 2014-01-02 DIAGNOSIS — I10 Essential (primary) hypertension: Secondary | ICD-10-CM | POA: Diagnosis not present

## 2014-01-02 DIAGNOSIS — G8192 Hemiplegia, unspecified affecting left dominant side: Secondary | ICD-10-CM | POA: Diagnosis not present

## 2014-01-05 DIAGNOSIS — G8192 Hemiplegia, unspecified affecting left dominant side: Secondary | ICD-10-CM | POA: Diagnosis not present

## 2014-01-05 DIAGNOSIS — I82403 Acute embolism and thrombosis of unspecified deep veins of lower extremity, bilateral: Secondary | ICD-10-CM | POA: Diagnosis not present

## 2014-01-05 DIAGNOSIS — I10 Essential (primary) hypertension: Secondary | ICD-10-CM | POA: Diagnosis not present

## 2014-01-05 DIAGNOSIS — R1312 Dysphagia, oropharyngeal phase: Secondary | ICD-10-CM | POA: Diagnosis not present

## 2014-01-05 DIAGNOSIS — S069X9S Unspecified intracranial injury with loss of consciousness of unspecified duration, sequela: Secondary | ICD-10-CM | POA: Diagnosis not present

## 2014-01-05 DIAGNOSIS — R4189 Other symptoms and signs involving cognitive functions and awareness: Secondary | ICD-10-CM | POA: Diagnosis not present

## 2014-01-06 ENCOUNTER — Encounter: Payer: Medicare Other | Attending: Physical Medicine & Rehabilitation | Admitting: Physical Medicine & Rehabilitation

## 2014-01-06 ENCOUNTER — Encounter: Payer: Self-pay | Admitting: Physical Medicine & Rehabilitation

## 2014-01-06 VITALS — BP 137/54 | HR 68 | Resp 14

## 2014-01-06 DIAGNOSIS — R1314 Dysphagia, pharyngoesophageal phase: Secondary | ICD-10-CM | POA: Diagnosis not present

## 2014-01-06 DIAGNOSIS — M62838 Other muscle spasm: Secondary | ICD-10-CM | POA: Diagnosis not present

## 2014-01-06 DIAGNOSIS — G8110 Spastic hemiplegia affecting unspecified side: Secondary | ICD-10-CM | POA: Diagnosis not present

## 2014-01-06 DIAGNOSIS — I82403 Acute embolism and thrombosis of unspecified deep veins of lower extremity, bilateral: Secondary | ICD-10-CM | POA: Diagnosis not present

## 2014-01-06 DIAGNOSIS — S069X9S Unspecified intracranial injury with loss of consciousness of unspecified duration, sequela: Secondary | ICD-10-CM | POA: Diagnosis not present

## 2014-01-06 DIAGNOSIS — G8114 Spastic hemiplegia affecting left nondominant side: Secondary | ICD-10-CM | POA: Diagnosis not present

## 2014-01-06 DIAGNOSIS — G8192 Hemiplegia, unspecified affecting left dominant side: Secondary | ICD-10-CM | POA: Diagnosis not present

## 2014-01-06 DIAGNOSIS — R4189 Other symptoms and signs involving cognitive functions and awareness: Secondary | ICD-10-CM | POA: Diagnosis not present

## 2014-01-06 DIAGNOSIS — S06890S Other specified intracranial injury without loss of consciousness, sequela: Secondary | ICD-10-CM | POA: Insufficient documentation

## 2014-01-06 DIAGNOSIS — S069X4S Unspecified intracranial injury with loss of consciousness of 6 hours to 24 hours, sequela: Secondary | ICD-10-CM | POA: Diagnosis not present

## 2014-01-06 DIAGNOSIS — R1312 Dysphagia, oropharyngeal phase: Secondary | ICD-10-CM | POA: Diagnosis not present

## 2014-01-06 DIAGNOSIS — R1310 Dysphagia, unspecified: Secondary | ICD-10-CM | POA: Diagnosis not present

## 2014-01-06 DIAGNOSIS — I10 Essential (primary) hypertension: Secondary | ICD-10-CM | POA: Diagnosis not present

## 2014-01-06 NOTE — Progress Notes (Signed)
Subjective:    Patient ID: Austin [name], male    DOB: [birth year], 80 y.o.   MRN: [id number]  HPI Here for botox injections Pain Inventory Average Pain 6 Pain Right Now 0 My pain is intermittent and aching  In the last 24 hours, has pain interfered with the following? General activity 0 Relation with others 0 Enjoyment of life 6 What TIME of day is your pain at its worst? night Sleep (in general) Good  Pain is worse with: bending Pain improves with: rest, heat/ice and medication Relief from Meds: 9  Mobility use a walker how many minutes can you walk? 10 ability to climb steps?  no do you drive?  no use a wheelchair  Function retired  Neuro/Psych trouble walking  Prior Studies Any changes since last visit?  no  Physicians involved in your care Any changes since last visit?  no   Family History  Problem Relation Age of Onset  . Lung cancer Father   . Hypertension Other   . Arthritis     History   Social History  . Marital Status: Married    Spouse Name: N/A    Number of Children: 3  . Years of Education: 16   Occupational History  . Mortician    Social History Main Topics  . Smoking status: Former Research scientist (life sciences)  . Smokeless tobacco: None  . Alcohol Use: No  . Drug Use: No  . Sexual Activity: None   Other Topics Concern  . 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 Working full time. Discussion needed in regard to Advance Care Planning-DNR/DNI, no artificial feeding or hydration, No HD, no heroic or futile.                Past Surgical History  Procedure Laterality Date  . Hernia repair    . Prostate cryoablation    . Esophagogastroduodenoscopy N/A 08/15/2013    Procedure: ESOPHAGOGASTRODUODENOSCOPY (EGD);  Surgeon: Gwenyth Ober, MD;  Location: [facility] ENDOSCOPY;  Service: General;  Laterality: N/A;  .  Peg placement N/A 09/02/2013    Procedure: PERCUTANEOUS ENDOSCOPIC GASTROSTOMY (PEG) PLACEMENT;  Surgeon: Gwenyth Ober, MD;  Location: [facility] ENDOSCOPY;  Service: General;  Laterality: N/A;   Past Medical History  Diagnosis Date  . ADENOCARCINOMA, PROSTATE, GLEASON GRADE 5 01/21/2009  . ALLERGIC RHINITIS 01/14/2007  . COLONIC POLYPS, HX OF 01/14/2007  . ELEVATED PROSTATE SPECIFIC ANTIGEN 03/27/2008  . ESOPHAGITIS 01/14/2007  . HYPERLIPIDEMIA 01/14/2007  . HYPERTENSION 01/14/2007  . LIBIDO, DECREASED 01/15/2007  . Hypertension   . Hypercholesterolemia    BP 137/54 mmHg  Pulse 68  Resp 14  SpO2 97%  Opioid Risk Score:   Fall Risk Score: Moderate Fall Risk (6-13 points)  Review of Systems  Constitutional: Negative.   HENT: Negative.   Eyes: Negative.   Respiratory: Negative.   Cardiovascular: Negative.   Gastrointestinal: Negative.   Endocrine: Negative.   Genitourinary: Negative.   Musculoskeletal: Positive for gait problem.  Skin: Negative.   Allergic/Immunologic: Negative.   Neurological: Negative.        Trouble walking  Hematological: Negative.   Psychiatric/Behavioral: Negative.        Objective:   Physical Exam Botox Injection for spasticity using needle EMG guidance Indication: spastic left hemiparesis (non-dominant), left upper ext  Dilution: 100  Units/ml        Total Units Injected: 300 Indication: Severe spasticity which interferes with ADL,mobility and/or  hygiene and is unresponsive to medication management and other conservative care Informed consent was obtained after describing risks and benefits of the procedure with the patient. This includes bleeding, bruising, infection, excessive weakness, or medication side effects. A REMS form is on file and signed.  Needle: 44m injectable monopolar needle electrode  Number of units per muscle Pectoralis Major 100 units Pectoralis Minor 75 units Right teres minor 25 Latissimus Dorsi 50 units Biceps 0  units Brachioradialis 0 units FCR 0 units FCU 0 units FDS 0 units FDP 0 units FPL 50 units Pronator Teres 0 units All injections were done after obtaining appropriate EMG activity and after negative drawback for blood. The patient tolerated the procedure well. Post procedure instructions were given. A followup appointment was made for 2 months.

## 2014-01-06 NOTE — Patient Instructions (Signed)
PLEASE CALL ME WITH ANY PROBLEMS OR QUESTIONS (#[phone]).      

## 2014-01-07 DIAGNOSIS — R1312 Dysphagia, oropharyngeal phase: Secondary | ICD-10-CM | POA: Diagnosis not present

## 2014-01-07 DIAGNOSIS — R4189 Other symptoms and signs involving cognitive functions and awareness: Secondary | ICD-10-CM | POA: Diagnosis not present

## 2014-01-07 DIAGNOSIS — I82403 Acute embolism and thrombosis of unspecified deep veins of lower extremity, bilateral: Secondary | ICD-10-CM | POA: Diagnosis not present

## 2014-01-07 DIAGNOSIS — S069X9S Unspecified intracranial injury with loss of consciousness of unspecified duration, sequela: Secondary | ICD-10-CM | POA: Diagnosis not present

## 2014-01-07 DIAGNOSIS — G8192 Hemiplegia, unspecified affecting left dominant side: Secondary | ICD-10-CM | POA: Diagnosis not present

## 2014-01-07 DIAGNOSIS — I10 Essential (primary) hypertension: Secondary | ICD-10-CM | POA: Diagnosis not present

## 2014-01-08 ENCOUNTER — Telehealth: Payer: Self-pay | Admitting: Internal Medicine

## 2014-01-08 DIAGNOSIS — S069X9S Unspecified intracranial injury with loss of consciousness of unspecified duration, sequela: Secondary | ICD-10-CM | POA: Diagnosis not present

## 2014-01-08 DIAGNOSIS — I10 Essential (primary) hypertension: Secondary | ICD-10-CM | POA: Diagnosis not present

## 2014-01-08 DIAGNOSIS — G8192 Hemiplegia, unspecified affecting left dominant side: Secondary | ICD-10-CM | POA: Diagnosis not present

## 2014-01-08 DIAGNOSIS — R4189 Other symptoms and signs involving cognitive functions and awareness: Secondary | ICD-10-CM | POA: Diagnosis not present

## 2014-01-08 DIAGNOSIS — I82403 Acute embolism and thrombosis of unspecified deep veins of lower extremity, bilateral: Secondary | ICD-10-CM | POA: Diagnosis not present

## 2014-01-08 DIAGNOSIS — R1312 Dysphagia, oropharyngeal phase: Secondary | ICD-10-CM | POA: Diagnosis not present

## 2014-01-08 NOTE — Telephone Encounter (Signed)
Austin [name] - Speech Therapist from [facility] calling to get orders to extend speech svcs for 3 more weeks, 2 X's per week.  [place] - [phone]

## 2014-01-08 NOTE — Telephone Encounter (Signed)
HHRN informed of verbal ok. 

## 2014-01-08 NOTE — Telephone Encounter (Signed)
Ok for verbal 

## 2014-01-12 DIAGNOSIS — I82403 Acute embolism and thrombosis of unspecified deep veins of lower extremity, bilateral: Secondary | ICD-10-CM | POA: Diagnosis not present

## 2014-01-12 DIAGNOSIS — R4189 Other symptoms and signs involving cognitive functions and awareness: Secondary | ICD-10-CM | POA: Diagnosis not present

## 2014-01-12 DIAGNOSIS — R1312 Dysphagia, oropharyngeal phase: Secondary | ICD-10-CM | POA: Diagnosis not present

## 2014-01-12 DIAGNOSIS — I10 Essential (primary) hypertension: Secondary | ICD-10-CM | POA: Diagnosis not present

## 2014-01-12 DIAGNOSIS — S069X9S Unspecified intracranial injury with loss of consciousness of unspecified duration, sequela: Secondary | ICD-10-CM | POA: Diagnosis not present

## 2014-01-12 DIAGNOSIS — G8192 Hemiplegia, unspecified affecting left dominant side: Secondary | ICD-10-CM | POA: Diagnosis not present

## 2014-01-13 DIAGNOSIS — I10 Essential (primary) hypertension: Secondary | ICD-10-CM | POA: Diagnosis not present

## 2014-01-13 DIAGNOSIS — I82403 Acute embolism and thrombosis of unspecified deep veins of lower extremity, bilateral: Secondary | ICD-10-CM | POA: Diagnosis not present

## 2014-01-13 DIAGNOSIS — S069X9S Unspecified intracranial injury with loss of consciousness of unspecified duration, sequela: Secondary | ICD-10-CM | POA: Diagnosis not present

## 2014-01-13 DIAGNOSIS — R1312 Dysphagia, oropharyngeal phase: Secondary | ICD-10-CM | POA: Diagnosis not present

## 2014-01-13 DIAGNOSIS — G8192 Hemiplegia, unspecified affecting left dominant side: Secondary | ICD-10-CM | POA: Diagnosis not present

## 2014-01-13 DIAGNOSIS — R4189 Other symptoms and signs involving cognitive functions and awareness: Secondary | ICD-10-CM | POA: Diagnosis not present

## 2014-01-14 ENCOUNTER — Telehealth: Payer: Self-pay | Admitting: Internal Medicine

## 2014-01-14 ENCOUNTER — Ambulatory Visit (INDEPENDENT_AMBULATORY_CARE_PROVIDER_SITE_OTHER): Payer: Medicare Other | Admitting: Family Medicine

## 2014-01-14 DIAGNOSIS — I82403 Acute embolism and thrombosis of unspecified deep veins of lower extremity, bilateral: Secondary | ICD-10-CM | POA: Diagnosis not present

## 2014-01-14 DIAGNOSIS — G8192 Hemiplegia, unspecified affecting left dominant side: Secondary | ICD-10-CM | POA: Diagnosis not present

## 2014-01-14 DIAGNOSIS — R4189 Other symptoms and signs involving cognitive functions and awareness: Secondary | ICD-10-CM | POA: Diagnosis not present

## 2014-01-14 DIAGNOSIS — R1312 Dysphagia, oropharyngeal phase: Secondary | ICD-10-CM | POA: Diagnosis not present

## 2014-01-14 DIAGNOSIS — S069X9S Unspecified intracranial injury with loss of consciousness of unspecified duration, sequela: Secondary | ICD-10-CM | POA: Diagnosis not present

## 2014-01-14 DIAGNOSIS — I10 Essential (primary) hypertension: Secondary | ICD-10-CM | POA: Diagnosis not present

## 2014-01-14 LAB — POCT INR: INR: 4

## 2014-01-14 NOTE — Telephone Encounter (Signed)
Tried to call [facility]

## 2014-01-14 NOTE — Telephone Encounter (Signed)
Reporting INR INR 4.0 PT 48.1 7.5 mg daily except for Wed takes 3m coum

## 2014-01-14 NOTE — Telephone Encounter (Signed)
See anticoagulation encounter for dosing instructions.

## 2014-01-14 NOTE — Telephone Encounter (Signed)
Attempted to call [name] back.  Voicemail full.  Will try again.

## 2014-01-15 DIAGNOSIS — R4189 Other symptoms and signs involving cognitive functions and awareness: Secondary | ICD-10-CM | POA: Diagnosis not present

## 2014-01-15 DIAGNOSIS — I82403 Acute embolism and thrombosis of unspecified deep veins of lower extremity, bilateral: Secondary | ICD-10-CM | POA: Diagnosis not present

## 2014-01-15 DIAGNOSIS — R1312 Dysphagia, oropharyngeal phase: Secondary | ICD-10-CM | POA: Diagnosis not present

## 2014-01-15 DIAGNOSIS — S069X9S Unspecified intracranial injury with loss of consciousness of unspecified duration, sequela: Secondary | ICD-10-CM | POA: Diagnosis not present

## 2014-01-15 DIAGNOSIS — G8192 Hemiplegia, unspecified affecting left dominant side: Secondary | ICD-10-CM | POA: Diagnosis not present

## 2014-01-15 DIAGNOSIS — I10 Essential (primary) hypertension: Secondary | ICD-10-CM | POA: Diagnosis not present

## 2014-01-20 DIAGNOSIS — I10 Essential (primary) hypertension: Secondary | ICD-10-CM | POA: Diagnosis not present

## 2014-01-20 DIAGNOSIS — G8192 Hemiplegia, unspecified affecting left dominant side: Secondary | ICD-10-CM | POA: Diagnosis not present

## 2014-01-20 DIAGNOSIS — S069X9S Unspecified intracranial injury with loss of consciousness of unspecified duration, sequela: Secondary | ICD-10-CM | POA: Diagnosis not present

## 2014-01-20 DIAGNOSIS — I82403 Acute embolism and thrombosis of unspecified deep veins of lower extremity, bilateral: Secondary | ICD-10-CM | POA: Diagnosis not present

## 2014-01-20 DIAGNOSIS — R4189 Other symptoms and signs involving cognitive functions and awareness: Secondary | ICD-10-CM | POA: Diagnosis not present

## 2014-01-20 DIAGNOSIS — R1312 Dysphagia, oropharyngeal phase: Secondary | ICD-10-CM | POA: Diagnosis not present

## 2014-01-21 ENCOUNTER — Telehealth: Payer: Self-pay | Admitting: *Deleted

## 2014-01-21 DIAGNOSIS — G8192 Hemiplegia, unspecified affecting left dominant side: Secondary | ICD-10-CM | POA: Diagnosis not present

## 2014-01-21 DIAGNOSIS — S069X9S Unspecified intracranial injury with loss of consciousness of unspecified duration, sequela: Secondary | ICD-10-CM | POA: Diagnosis not present

## 2014-01-21 DIAGNOSIS — I10 Essential (primary) hypertension: Secondary | ICD-10-CM | POA: Diagnosis not present

## 2014-01-21 DIAGNOSIS — I82403 Acute embolism and thrombosis of unspecified deep veins of lower extremity, bilateral: Secondary | ICD-10-CM | POA: Diagnosis not present

## 2014-01-21 DIAGNOSIS — R1312 Dysphagia, oropharyngeal phase: Secondary | ICD-10-CM | POA: Diagnosis not present

## 2014-01-21 DIAGNOSIS — R4189 Other symptoms and signs involving cognitive functions and awareness: Secondary | ICD-10-CM | POA: Diagnosis not present

## 2014-01-21 NOTE — Telephone Encounter (Signed)
Left msg on triage calling in pt PT/INR for today INR 3.2 & PT 38.1

## 2014-01-22 DIAGNOSIS — R1312 Dysphagia, oropharyngeal phase: Secondary | ICD-10-CM | POA: Diagnosis not present

## 2014-01-22 DIAGNOSIS — I10 Essential (primary) hypertension: Secondary | ICD-10-CM | POA: Diagnosis not present

## 2014-01-22 DIAGNOSIS — I82403 Acute embolism and thrombosis of unspecified deep veins of lower extremity, bilateral: Secondary | ICD-10-CM | POA: Diagnosis not present

## 2014-01-22 DIAGNOSIS — G8192 Hemiplegia, unspecified affecting left dominant side: Secondary | ICD-10-CM | POA: Diagnosis not present

## 2014-01-22 DIAGNOSIS — R4189 Other symptoms and signs involving cognitive functions and awareness: Secondary | ICD-10-CM | POA: Diagnosis not present

## 2014-01-22 DIAGNOSIS — S069X9S Unspecified intracranial injury with loss of consciousness of unspecified duration, sequela: Secondary | ICD-10-CM | POA: Diagnosis not present

## 2014-01-22 NOTE — Telephone Encounter (Signed)
Attempted to call [facility] Nurse, no answer and vm full.

## 2014-01-22 NOTE — Telephone Encounter (Signed)
Ok to hold coumadin x 1 day, then change to 5 mg Mon-wed-Fri with 7.5 other days, with f/u INR 2 weeks

## 2014-01-22 NOTE — Telephone Encounter (Signed)
Called HHRN, INR was 3.2 and PT 38.8.  Current dose 5 mg Monday and wednesdays and all other days 7.5 mg.. Advise if any dose change at this time.

## 2014-01-22 NOTE — Telephone Encounter (Signed)
HHRN informed of PCP instructions on coumadin and f/u INR in 2 weeks.[name]

## 2014-01-22 NOTE — Telephone Encounter (Signed)
Austin [name] called in and is needing to talk to some one about pt INR.     [facility]

## 2014-01-26 DIAGNOSIS — I82403 Acute embolism and thrombosis of unspecified deep veins of lower extremity, bilateral: Secondary | ICD-10-CM | POA: Diagnosis not present

## 2014-01-26 DIAGNOSIS — I10 Essential (primary) hypertension: Secondary | ICD-10-CM | POA: Diagnosis not present

## 2014-01-26 DIAGNOSIS — S069X9S Unspecified intracranial injury with loss of consciousness of unspecified duration, sequela: Secondary | ICD-10-CM | POA: Diagnosis not present

## 2014-01-26 DIAGNOSIS — R4189 Other symptoms and signs involving cognitive functions and awareness: Secondary | ICD-10-CM | POA: Diagnosis not present

## 2014-01-26 DIAGNOSIS — R1312 Dysphagia, oropharyngeal phase: Secondary | ICD-10-CM | POA: Diagnosis not present

## 2014-01-26 DIAGNOSIS — G8192 Hemiplegia, unspecified affecting left dominant side: Secondary | ICD-10-CM | POA: Diagnosis not present

## 2014-01-27 ENCOUNTER — Telehealth: Payer: Self-pay | Admitting: *Deleted

## 2014-01-27 DIAGNOSIS — G8192 Hemiplegia, unspecified affecting left dominant side: Secondary | ICD-10-CM | POA: Diagnosis not present

## 2014-01-27 DIAGNOSIS — I82403 Acute embolism and thrombosis of unspecified deep veins of lower extremity, bilateral: Secondary | ICD-10-CM | POA: Diagnosis not present

## 2014-01-27 DIAGNOSIS — S069X9S Unspecified intracranial injury with loss of consciousness of unspecified duration, sequela: Secondary | ICD-10-CM | POA: Diagnosis not present

## 2014-01-27 DIAGNOSIS — R4189 Other symptoms and signs involving cognitive functions and awareness: Secondary | ICD-10-CM | POA: Diagnosis not present

## 2014-01-27 DIAGNOSIS — I10 Essential (primary) hypertension: Secondary | ICD-10-CM | POA: Diagnosis not present

## 2014-01-27 DIAGNOSIS — R1312 Dysphagia, oropharyngeal phase: Secondary | ICD-10-CM | POA: Diagnosis not present

## 2014-01-27 NOTE — Telephone Encounter (Signed)
Austin [name] from [facility] called to continue therapy - PT, OT & ST.  Please fax over written orders to [facility] [phone]

## 2014-01-29 DIAGNOSIS — S069X9S Unspecified intracranial injury with loss of consciousness of unspecified duration, sequela: Secondary | ICD-10-CM | POA: Diagnosis not present

## 2014-01-29 DIAGNOSIS — I10 Essential (primary) hypertension: Secondary | ICD-10-CM | POA: Diagnosis not present

## 2014-01-29 DIAGNOSIS — I82403 Acute embolism and thrombosis of unspecified deep veins of lower extremity, bilateral: Secondary | ICD-10-CM | POA: Diagnosis not present

## 2014-01-29 DIAGNOSIS — R1312 Dysphagia, oropharyngeal phase: Secondary | ICD-10-CM | POA: Diagnosis not present

## 2014-01-29 DIAGNOSIS — G8192 Hemiplegia, unspecified affecting left dominant side: Secondary | ICD-10-CM | POA: Diagnosis not present

## 2014-01-29 DIAGNOSIS — R4189 Other symptoms and signs involving cognitive functions and awareness: Secondary | ICD-10-CM | POA: Diagnosis not present

## 2014-02-03 ENCOUNTER — Encounter: Payer: Self-pay | Admitting: Internal Medicine

## 2014-02-03 ENCOUNTER — Ambulatory Visit (INDEPENDENT_AMBULATORY_CARE_PROVIDER_SITE_OTHER): Payer: Medicare Other | Admitting: Internal Medicine

## 2014-02-03 ENCOUNTER — Other Ambulatory Visit (INDEPENDENT_AMBULATORY_CARE_PROVIDER_SITE_OTHER): Payer: Medicare Other

## 2014-02-03 DIAGNOSIS — Z7901 Long term (current) use of anticoagulants: Secondary | ICD-10-CM

## 2014-02-03 DIAGNOSIS — I10 Essential (primary) hypertension: Secondary | ICD-10-CM

## 2014-02-03 DIAGNOSIS — G8114 Spastic hemiplegia affecting left nondominant side: Secondary | ICD-10-CM

## 2014-02-03 DIAGNOSIS — H9191 Unspecified hearing loss, right ear: Secondary | ICD-10-CM | POA: Diagnosis not present

## 2014-02-03 DIAGNOSIS — E785 Hyperlipidemia, unspecified: Secondary | ICD-10-CM

## 2014-02-03 DIAGNOSIS — G8110 Spastic hemiplegia affecting unspecified side: Secondary | ICD-10-CM | POA: Diagnosis not present

## 2014-02-03 DIAGNOSIS — L989 Disorder of the skin and subcutaneous tissue, unspecified: Secondary | ICD-10-CM | POA: Diagnosis not present

## 2014-02-03 LAB — PROTIME-INR
INR: 2.1 ratio — ABNORMAL HIGH (ref 0.8–1.0)
Prothrombin Time: 23.1 s — ABNORMAL HIGH (ref 9.6–13.1)

## 2014-02-03 MED ORDER — BACLOFEN 10 MG PO TABS: 5.0000 mg | ORAL_TABLET | Freq: Two times a day (BID) | ORAL | Status: DC | PRN

## 2014-02-03 MED ORDER — TIZANIDINE HCL 4 MG PO TABS: 4.0000 mg | ORAL_TABLET | Freq: Three times a day (TID) | ORAL | Status: DC

## 2014-02-03 NOTE — Assessment & Plan Note (Signed)
stable overall by history and exam, recent data reviewed with pt, and pt to continue medical treatment as before,  to f/u any worsening symptoms or concerns BP Readings from Last 3 Encounters:  01/06/14 137/54  12/01/13 142/84  10/31/13 142/84

## 2014-02-03 NOTE — Assessment & Plan Note (Addendum)
For derm referral  Note:  Total time for pt hx, exam, review of record with pt in the room, determination of diagnoses and plan for further eval and tx is > 40 min, with over 50% spent in coordination and counseling of patient

## 2014-02-03 NOTE — Progress Notes (Signed)
Subjective:    Patient ID: Austin [name], male    DOB: 10/05/1932, 81 y.o.   MRN: [id number]  HPI  .here to f/u with family and private caregiver;  Finished home PT, left side still weakened, now referred for outpt level care. Has ongoing f/u with Rehab medicine. No recent falls. Can walk with assist and walker up to 300 steps per day.  Denies urinary symptoms such as dysuria, frequency, urgency, flank pain, hematuria or n/v, fever, chills.  S/p PEG removed, site healed.  Has mild left hand contracture, has been ordered for sleeve and splint, also for botox per Rehab med, last done dec 15, working per therapist per wife.  No overt bleeding on coumadin.  Still with skin lesion/hard place at previous shunt tube per NS.  Also d/c per [facility], needs coumadin clinic f/u.  Also with shallow coccyx ulceration, healing per caregiver. Also asks for ear wax check. Past Medical History  Diagnosis Date  . ADENOCARCINOMA, PROSTATE, GLEASON GRADE 5 01/21/2009  . ALLERGIC RHINITIS 01/14/2007  . COLONIC POLYPS, HX OF 01/14/2007  . ELEVATED PROSTATE SPECIFIC ANTIGEN 03/27/2008  . ESOPHAGITIS 01/14/2007  . HYPERLIPIDEMIA 01/14/2007  . HYPERTENSION 01/14/2007  . LIBIDO, DECREASED 01/15/2007  . Hypertension   . Hypercholesterolemia    Past Surgical History  Procedure Laterality Date  . Hernia repair    . Prostate cryoablation    . Esophagogastroduodenoscopy N/A 08/15/2013    Procedure: ESOPHAGOGASTRODUODENOSCOPY (EGD);  Surgeon: Gwenyth Ober, MD;  Location: [facility] ENDOSCOPY;  Service: General;  Laterality: N/A;  . Peg placement N/A 09/02/2013    Procedure: PERCUTANEOUS ENDOSCOPIC GASTROSTOMY (PEG) PLACEMENT;  Surgeon: Gwenyth Ober, MD;  Location: [place];  Service: General;  Laterality: N/A;    reports that he has quit smoking. He does not have any smokeless tobacco history on file. He reports that he does not drink alcohol or use illicit drugs. family history includes Arthritis in an other family member;  Hypertension in his other; Lung cancer in his father. No Known Allergies Current Outpatient Prescriptions on File Prior to Visit  Medication Sig Dispense Refill  . baclofen (LIORESAL) 10 MG tablet Take 5 mg by mouth 2 (two) times daily as needed for muscle spasms.    . Multiple Vitamin (MULTIVITAMIN) LIQD Place 5 mLs into feeding tube daily. 240 mL 1  . omeprazole (PRILOSEC) 40 MG capsule Open capsule and place administer medication via PEG. 30 capsule 1  . tiZANidine (ZANAFLEX) 4 MG tablet Place 1 tablet (4 mg total) into feeding tube 4 (four) times daily -  with meals and at bedtime. 120 tablet 0  . warfarin (COUMADIN) 5 MG tablet Take 1 tablet (5 mg total) by mouth daily. 30 tablet 1  . amantadine (SYMMETREL) 100 MG capsule Place 1 capsule (100 mg total) into feeding tube 2 (two) times daily. Open capsule and place medication in feeding tube. (Patient not taking: Reported on 02/03/2014) 60 capsule 1  . bethanechol (URECHOLINE) 10 MG tablet Take 1 tablet (10 mg total) by mouth 3 (three) times daily. (Patient not taking: Reported on 02/03/2014) 90 tablet 1  . chlorhexidine (PERIDEX) 0.12 % solution 15 mLs by Mouth Rinse route 2 (two) times daily. (Patient not taking: Reported on 02/03/2014) 480 mL 0  . cholestyramine light (PREVALITE) 4 GM/DOSE powder Take 4 g by mouth daily.    . food thickener (THICK IT) POWD Take 1 g by mouth 2 (two) times daily.    . fosfomycin (MONUROL) 3 G  PACK Place 3 g into feeding tube once. On 10/24/13 only (Patient not taking: Reported on 02/03/2014) 1 g 0  . metoprolol tartrate (LOPRESSOR) 25 MG tablet Take 12.5 mg by mouth 2 (two) times daily.    . Water For Irrigation, Sterile (FREE WATER) SOLN Place 200 mLs into feeding tube 5 (five) times daily. (Patient not taking: Reported on 02/03/2014)     No current facility-administered medications on file prior to visit.   Review of Systems  Constitutional: Negative for unusual diaphoresis or other sweats  HENT: Negative for  ringing in ear Eyes: Negative for double vision or worsening visual disturbance.  Respiratory: Negative for choking and stridor.   Gastrointestinal: Negative for vomiting or other signifcant bowel change Genitourinary: Negative for hematuria or decreased urine volume.  Musculoskeletal: Negative for other MSK pain or swelling Skin: Negative for color change and worsening wound.  Neurological: Negative for tremors and numbness other than noted  Psychiatric/Behavioral: Negative for decreased concentration or agitation other than above       Objective:   Physical Exam There were no vitals taken for this visit. VS noted,  Constitutional: Pt appears well-developed, well-nourished.  HENT: Head: NCAT.  Right Ear: External ear normal.  Left Ear: External ear normal.  Right tm clear after wax impaction irrigated Eyes: . Pupils are equal, round, and reactive to light. Conjunctivae and EOM are normal Neck: Normal range of motion. Neck supple.  Cardiovascular: Normal rate and regular rhythm.   Pulmonary/Chest: Effort normal and breath sounds without rales or wheezing.  Abd:  Soft, NT, ND, + BS Neurological: [name] alert. Not confused , motor intact 5/5, cn 2-12 with left facial weakness, and  except persistent  Left sided weakness Skin: Skin is warm. No rash, but right scalp with 1/2 cm raised hard dark lesion Psychiatric: Pt behavior is normal. No agitation.     Assessment & Plan:

## 2014-02-03 NOTE — Assessment & Plan Note (Signed)
Improved with irrigation

## 2014-02-03 NOTE — Assessment & Plan Note (Signed)
Also for referral to [facility] Coumadin clinic -

## 2014-02-03 NOTE — Patient Instructions (Addendum)
Your right ear was irrigated of wax today  Please continue all other medications as before, and refills have been done if requested.  Please have the pharmacy call with any other refills you may need.  You are otherwise up to date with prevention measures today, and please return if you change your mind about the Prevnar pneumonia shot  Please keep your appointments with your specialists as you may have planned  You will be contacted regarding the referral for: Coumadin clinic, and Dermatology  Please call if the ulceration does not continue to heal, as we can order Duoderm if needed, to help take pressure off  Please return in 3 months, or sooner if needed

## 2014-02-03 NOTE — Assessment & Plan Note (Signed)
stable overall by history and exam, recent data reviewed with pt, and pt to continue medical treatment as before,  to f/u any worsening symptoms or concerns Lab Results  Component Value Date   LDLCALC 110* 04/03/2013    

## 2014-02-03 NOTE — Addendum Note (Signed)
Addended by: Murtis Sink A on: 02/03/2014 03:19 PM   Modules accepted: Orders

## 2014-02-03 NOTE — Assessment & Plan Note (Signed)
To cont baclofen/tizanidine, f/u rehab med as planned

## 2014-02-03 NOTE — Progress Notes (Signed)
Pre visit review using our clinic review tool, if applicable. No additional management support is needed unless otherwise documented below in the visit note. 

## 2014-02-05 ENCOUNTER — Telehealth: Payer: Self-pay | Admitting: Internal Medicine

## 2014-02-05 ENCOUNTER — Telehealth: Payer: Self-pay | Admitting: Family Medicine

## 2014-02-05 MED ORDER — DUODERM CGF DRESSING EX MISC: CUTANEOUS | Status: DC

## 2014-02-05 NOTE — Telephone Encounter (Signed)
Done erx 

## 2014-02-05 NOTE — Telephone Encounter (Signed)
Would like script for laceration on buttocks to be sent to [facility] in [place].

## 2014-02-05 NOTE — Telephone Encounter (Signed)
Error

## 2014-02-09 ENCOUNTER — Ambulatory Visit (HOSPITAL_COMMUNITY)
Admission: RE | Admit: 2014-02-09 | Discharge: 2014-02-09 | Disposition: A | Discharge status: Home / Self Care | Payer: Medicare Other | Source: Ambulatory Visit | Attending: Physical Medicine & Rehabilitation | Admitting: Physical Medicine & Rehabilitation

## 2014-02-09 DIAGNOSIS — S0990XS Unspecified injury of head, sequela: Secondary | ICD-10-CM

## 2014-02-09 DIAGNOSIS — G8114 Spastic hemiplegia affecting left nondominant side: Secondary | ICD-10-CM | POA: Insufficient documentation

## 2014-02-09 DIAGNOSIS — S06890S Other specified intracranial injury without loss of consciousness, sequela: Secondary | ICD-10-CM | POA: Insufficient documentation

## 2014-02-09 DIAGNOSIS — R269 Unspecified abnormalities of gait and mobility: Secondary | ICD-10-CM | POA: Diagnosis not present

## 2014-02-09 DIAGNOSIS — F09 Unspecified mental disorder due to known physiological condition: Secondary | ICD-10-CM

## 2014-02-09 DIAGNOSIS — M6281 Muscle weakness (generalized): Secondary | ICD-10-CM | POA: Insufficient documentation
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
## 2014-02-12 ENCOUNTER — Encounter (HOSPITAL_COMMUNITY): Payer: Self-pay

## 2014-02-12 ENCOUNTER — Ambulatory Visit (HOSPITAL_COMMUNITY)
Admission: RE | Admit: 2014-02-12 | Discharge: 2014-02-12 | Disposition: A | Discharge status: Home / Self Care | Payer: Medicare Other | Source: Ambulatory Visit | Attending: Physical Medicine & Rehabilitation | Admitting: Physical Medicine & Rehabilitation

## 2014-02-12 DIAGNOSIS — W3400XD Accidental discharge from unspecified firearms or gun, subsequent encounter: Secondary | ICD-10-CM

## 2014-02-12 DIAGNOSIS — R252 Cramp and spasm: Secondary | ICD-10-CM

## 2014-02-12 DIAGNOSIS — G8114 Spastic hemiplegia affecting left nondominant side: Secondary | ICD-10-CM | POA: Diagnosis not present

## 2014-02-12 DIAGNOSIS — G8110 Spastic hemiplegia affecting unspecified side: Secondary | ICD-10-CM

## 2014-02-12 DIAGNOSIS — R269 Unspecified abnormalities of gait and mobility: Secondary | ICD-10-CM | POA: Diagnosis not present

## 2014-02-12 DIAGNOSIS — R29898 Other symptoms and signs involving the musculoskeletal system: Secondary | ICD-10-CM

## 2014-02-12 DIAGNOSIS — M6281 Muscle weakness (generalized): Secondary | ICD-10-CM | POA: Diagnosis not present

## 2014-02-12 DIAGNOSIS — S06890S Other specified intracranial injury without loss of consciousness, sequela: Secondary | ICD-10-CM | POA: Diagnosis not present

## 2014-02-12 DIAGNOSIS — S069X0A Unspecified intracranial injury without loss of consciousness, initial encounter: Secondary | ICD-10-CM

## 2014-02-12 DIAGNOSIS — R531 Weakness: Secondary | ICD-10-CM

## 2014-02-12 DIAGNOSIS — S0291XS Unspecified fracture of skull, sequela: Secondary | ICD-10-CM

## 2014-02-12 DIAGNOSIS — S0193XD Puncture wound without foreign body of unspecified part of head, subsequent encounter: Secondary | ICD-10-CM

## 2014-02-12 DIAGNOSIS — R2689 Other abnormalities of gait and mobility: Secondary | ICD-10-CM

## 2014-02-12 NOTE — Patient Instructions (Signed)
Bridging   Slowly raise buttocks from floor, keeping stomach tight. Repeat __10-15__ times per set. Do _1___ sets per session. Do __2__ sessions per day.  http://orth.exer.us/1096   Copyright  VHI. All rights reserved.  Hip Abduction / Adduction: with Extended Knee (Supine)   Bring left leg out to side and return. Keep knee straight. Repeat _10___ times per set. Do __1__ sets per session. Do 2____ sessions per day.  http://orth.exer.us/680   Copyright  VHI. All rights reserved.  Lower Trunk Rotation Stretch   Keeping back flat and feet together, rotate knees to left side. Hold __3__ seconds. Repeat _10___ times per set. Do _1___ sets per session. Do __2http://orth.exer.us/122   Copyright  VHI. All rights reserved.  Lower Trunk Rotation Stretch   Keeping back flat and feet together, rotate knees to left side. Hold ____ seconds. Repeat ____ times per set. Do ____ sets per session. Do ____ sessions per day.  http://orth.exer.us/122   Copyright  VHI. All rights reserved.  Scapular Retraction (Standing)   With arms at sides, pinch shoulder blades together. Repeat ___10-15_ times per set. Do __1__ sets per session. Do _3__ sessions per day.  http://orth.exer.us/944   Copyright  VHI. All rights reserved.
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
## 2014-02-16 ENCOUNTER — Ambulatory Visit (INDEPENDENT_AMBULATORY_CARE_PROVIDER_SITE_OTHER): Payer: Medicare Other | Admitting: *Deleted

## 2014-02-16 ENCOUNTER — Ambulatory Visit (HOSPITAL_COMMUNITY)
Admission: RE | Admit: 2014-02-16 | Discharge: 2014-02-16 | Disposition: A | Discharge status: Home / Self Care | Payer: Medicare Other | Source: Ambulatory Visit | Attending: Physical Medicine & Rehabilitation | Admitting: Physical Medicine & Rehabilitation

## 2014-02-16 DIAGNOSIS — S0990XS Unspecified injury of head, sequela: Principal | ICD-10-CM

## 2014-02-16 DIAGNOSIS — I480 Paroxysmal atrial fibrillation: Secondary | ICD-10-CM

## 2014-02-16 DIAGNOSIS — R2689 Other abnormalities of gait and mobility: Secondary | ICD-10-CM

## 2014-02-16 DIAGNOSIS — Z7901 Long term (current) use of anticoagulants: Secondary | ICD-10-CM

## 2014-02-16 DIAGNOSIS — M6281 Muscle weakness (generalized): Secondary | ICD-10-CM | POA: Diagnosis not present

## 2014-02-16 DIAGNOSIS — R252 Cramp and spasm: Secondary | ICD-10-CM

## 2014-02-16 DIAGNOSIS — G8114 Spastic hemiplegia affecting left nondominant side: Secondary | ICD-10-CM | POA: Diagnosis not present

## 2014-02-16 DIAGNOSIS — S069X0A Unspecified intracranial injury without loss of consciousness, initial encounter: Secondary | ICD-10-CM

## 2014-02-16 DIAGNOSIS — F09 Unspecified mental disorder due to known physiological condition: Secondary | ICD-10-CM

## 2014-02-16 DIAGNOSIS — R29898 Other symptoms and signs involving the musculoskeletal system: Secondary | ICD-10-CM

## 2014-02-16 DIAGNOSIS — S06890S Other specified intracranial injury without loss of consciousness, sequela: Secondary | ICD-10-CM | POA: Diagnosis not present

## 2014-02-16 DIAGNOSIS — R531 Weakness: Secondary | ICD-10-CM

## 2014-02-16 DIAGNOSIS — R269 Unspecified abnormalities of gait and mobility: Secondary | ICD-10-CM | POA: Diagnosis not present

## 2014-02-16 LAB — POCT INR: INR: 2.3

## 2014-02-16 MED ORDER — WARFARIN SODIUM 5 MG PO TABS: ORAL_TABLET | ORAL | Status: DC
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
## 2014-02-19 ENCOUNTER — Ambulatory Visit (HOSPITAL_COMMUNITY)
Admission: RE | Admit: 2014-02-19 | Discharge: 2014-02-19 | Disposition: A | Discharge status: Home / Self Care | Payer: Medicare Other | Source: Ambulatory Visit | Attending: Physical Medicine & Rehabilitation | Admitting: Physical Medicine & Rehabilitation

## 2014-02-19 ENCOUNTER — Encounter (HOSPITAL_COMMUNITY): Payer: Self-pay

## 2014-02-19 DIAGNOSIS — R252 Cramp and spasm: Secondary | ICD-10-CM

## 2014-02-19 DIAGNOSIS — R531 Weakness: Secondary | ICD-10-CM

## 2014-02-19 DIAGNOSIS — S06890S Other specified intracranial injury without loss of consciousness, sequela: Secondary | ICD-10-CM | POA: Diagnosis not present

## 2014-02-19 DIAGNOSIS — R29898 Other symptoms and signs involving the musculoskeletal system: Secondary | ICD-10-CM

## 2014-02-19 DIAGNOSIS — S069X0A Unspecified intracranial injury without loss of consciousness, initial encounter: Secondary | ICD-10-CM

## 2014-02-19 DIAGNOSIS — S0990XS Unspecified injury of head, sequela: Principal | ICD-10-CM

## 2014-02-19 DIAGNOSIS — W3400XD Accidental discharge from unspecified firearms or gun, subsequent encounter: Secondary | ICD-10-CM

## 2014-02-19 DIAGNOSIS — F09 Unspecified mental disorder due to known physiological condition: Secondary | ICD-10-CM

## 2014-02-19 DIAGNOSIS — R269 Unspecified abnormalities of gait and mobility: Secondary | ICD-10-CM | POA: Diagnosis not present

## 2014-02-19 DIAGNOSIS — S0183XD Puncture wound without foreign body of other part of head, subsequent encounter: Secondary | ICD-10-CM

## 2014-02-19 DIAGNOSIS — G8110 Spastic hemiplegia affecting unspecified side: Secondary | ICD-10-CM

## 2014-02-19 DIAGNOSIS — M6281 Muscle weakness (generalized): Secondary | ICD-10-CM | POA: Diagnosis not present

## 2014-02-19 DIAGNOSIS — R2689 Other abnormalities of gait and mobility: Secondary | ICD-10-CM

## 2014-02-19 DIAGNOSIS — R471 Dysarthria and anarthria: Secondary | ICD-10-CM

## 2014-02-19 DIAGNOSIS — S0193XD Puncture wound without foreign body of unspecified part of head, subsequent encounter: Secondary | ICD-10-CM

## 2014-02-19 DIAGNOSIS — I824Z2 Acute embolism and thrombosis of unspecified deep veins of left distal lower extremity: Secondary | ICD-10-CM

## 2014-02-19 DIAGNOSIS — G8114 Spastic hemiplegia affecting left nondominant side: Secondary | ICD-10-CM | POA: Diagnosis not present
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
## 2014-02-24 ENCOUNTER — Ambulatory Visit (HOSPITAL_COMMUNITY)
Admission: RE | Admit: 2014-02-24 | Discharge: 2014-02-24 | Disposition: A | Discharge status: Home / Self Care | Payer: Medicare Other | Source: Ambulatory Visit | Attending: Physical Medicine & Rehabilitation | Admitting: Physical Medicine & Rehabilitation

## 2014-02-24 DIAGNOSIS — S06890S Other specified intracranial injury without loss of consciousness, sequela: Secondary | ICD-10-CM | POA: Insufficient documentation

## 2014-02-24 DIAGNOSIS — W3400XD Accidental discharge from unspecified firearms or gun, subsequent encounter: Secondary | ICD-10-CM

## 2014-02-24 DIAGNOSIS — M6281 Muscle weakness (generalized): Secondary | ICD-10-CM | POA: Insufficient documentation

## 2014-02-24 DIAGNOSIS — R269 Unspecified abnormalities of gait and mobility: Secondary | ICD-10-CM | POA: Insufficient documentation

## 2014-02-24 DIAGNOSIS — R252 Cramp and spasm: Secondary | ICD-10-CM

## 2014-02-24 DIAGNOSIS — R29898 Other symptoms and signs involving the musculoskeletal system: Secondary | ICD-10-CM

## 2014-02-24 DIAGNOSIS — I824Z2 Acute embolism and thrombosis of unspecified deep veins of left distal lower extremity: Secondary | ICD-10-CM

## 2014-02-24 DIAGNOSIS — S069X0A Unspecified intracranial injury without loss of consciousness, initial encounter: Secondary | ICD-10-CM

## 2014-02-24 DIAGNOSIS — R2689 Other abnormalities of gait and mobility: Secondary | ICD-10-CM

## 2014-02-24 DIAGNOSIS — G8110 Spastic hemiplegia affecting unspecified side: Secondary | ICD-10-CM

## 2014-02-24 DIAGNOSIS — S0193XD Puncture wound without foreign body of unspecified part of head, subsequent encounter: Secondary | ICD-10-CM

## 2014-02-24 DIAGNOSIS — R531 Weakness: Secondary | ICD-10-CM

## 2014-02-24 DIAGNOSIS — S0990XS Unspecified injury of head, sequela: Principal | ICD-10-CM

## 2014-02-24 DIAGNOSIS — G8114 Spastic hemiplegia affecting left nondominant side: Secondary | ICD-10-CM | POA: Insufficient documentation

## 2014-02-24 DIAGNOSIS — F09 Unspecified mental disorder due to known physiological condition: Secondary | ICD-10-CM

## 2014-02-24 DIAGNOSIS — S0183XD Puncture wound without foreign body of other part of head, subsequent encounter: Secondary | ICD-10-CM
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
## 2014-02-26 ENCOUNTER — Encounter (HOSPITAL_COMMUNITY): Payer: Self-pay

## 2014-02-26 ENCOUNTER — Ambulatory Visit (HOSPITAL_COMMUNITY)
Admission: RE | Admit: 2014-02-26 | Discharge: 2014-02-26 | Disposition: A | Discharge status: Home / Self Care | Payer: Medicare Other | Source: Ambulatory Visit | Attending: Physical Medicine & Rehabilitation | Admitting: Physical Medicine & Rehabilitation

## 2014-02-26 ENCOUNTER — Encounter (HOSPITAL_COMMUNITY): Payer: Self-pay | Admitting: Speech Pathology

## 2014-02-26 DIAGNOSIS — S0990XS Unspecified injury of head, sequela: Secondary | ICD-10-CM

## 2014-02-26 DIAGNOSIS — R531 Weakness: Secondary | ICD-10-CM

## 2014-02-26 DIAGNOSIS — R2689 Other abnormalities of gait and mobility: Secondary | ICD-10-CM

## 2014-02-26 DIAGNOSIS — F09 Unspecified mental disorder due to known physiological condition: Secondary | ICD-10-CM

## 2014-02-26 DIAGNOSIS — G8114 Spastic hemiplegia affecting left nondominant side: Secondary | ICD-10-CM | POA: Diagnosis not present

## 2014-02-26 DIAGNOSIS — S06890S Other specified intracranial injury without loss of consciousness, sequela: Secondary | ICD-10-CM | POA: Diagnosis not present

## 2014-02-26 DIAGNOSIS — R29898 Other symptoms and signs involving the musculoskeletal system: Secondary | ICD-10-CM

## 2014-02-26 DIAGNOSIS — S069X0A Unspecified intracranial injury without loss of consciousness, initial encounter: Secondary | ICD-10-CM

## 2014-02-26 DIAGNOSIS — G8110 Spastic hemiplegia affecting unspecified side: Secondary | ICD-10-CM

## 2014-02-26 DIAGNOSIS — R471 Dysarthria and anarthria: Secondary | ICD-10-CM

## 2014-02-26 DIAGNOSIS — M6281 Muscle weakness (generalized): Secondary | ICD-10-CM | POA: Diagnosis not present

## 2014-02-26 DIAGNOSIS — R269 Unspecified abnormalities of gait and mobility: Secondary | ICD-10-CM | POA: Diagnosis not present
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
## 2014-03-02 ENCOUNTER — Ambulatory Visit (HOSPITAL_COMMUNITY)
Admission: RE | Admit: 2014-03-02 | Discharge: 2014-03-02 | Disposition: A | Discharge status: Home / Self Care | Payer: Medicare Other | Source: Ambulatory Visit | Attending: Physical Medicine & Rehabilitation | Admitting: Physical Medicine & Rehabilitation

## 2014-03-02 ENCOUNTER — Encounter (HOSPITAL_COMMUNITY): Payer: Self-pay

## 2014-03-02 DIAGNOSIS — R531 Weakness: Secondary | ICD-10-CM

## 2014-03-02 DIAGNOSIS — I824Z2 Acute embolism and thrombosis of unspecified deep veins of left distal lower extremity: Secondary | ICD-10-CM

## 2014-03-02 DIAGNOSIS — F09 Unspecified mental disorder due to known physiological condition: Secondary | ICD-10-CM

## 2014-03-02 DIAGNOSIS — S0990XS Unspecified injury of head, sequela: Secondary | ICD-10-CM

## 2014-03-02 DIAGNOSIS — G8114 Spastic hemiplegia affecting left nondominant side: Secondary | ICD-10-CM | POA: Diagnosis not present

## 2014-03-02 DIAGNOSIS — S069X0A Unspecified intracranial injury without loss of consciousness, initial encounter: Secondary | ICD-10-CM

## 2014-03-02 DIAGNOSIS — S06890S Other specified intracranial injury without loss of consciousness, sequela: Secondary | ICD-10-CM | POA: Diagnosis not present

## 2014-03-02 DIAGNOSIS — G8110 Spastic hemiplegia affecting unspecified side: Secondary | ICD-10-CM

## 2014-03-02 DIAGNOSIS — M6281 Muscle weakness (generalized): Secondary | ICD-10-CM | POA: Diagnosis not present

## 2014-03-02 DIAGNOSIS — S0193XS Puncture wound without foreign body of unspecified part of head, sequela: Secondary | ICD-10-CM

## 2014-03-02 DIAGNOSIS — R471 Dysarthria and anarthria: Secondary | ICD-10-CM

## 2014-03-02 DIAGNOSIS — R269 Unspecified abnormalities of gait and mobility: Secondary | ICD-10-CM | POA: Diagnosis not present

## 2014-03-02 DIAGNOSIS — R2689 Other abnormalities of gait and mobility: Secondary | ICD-10-CM

## 2014-03-02 DIAGNOSIS — R29898 Other symptoms and signs involving the musculoskeletal system: Secondary | ICD-10-CM

## 2014-03-02 DIAGNOSIS — W3400XS Accidental discharge from unspecified firearms or gun, sequela: Secondary | ICD-10-CM

## 2014-03-02 DIAGNOSIS — R252 Cramp and spasm: Secondary | ICD-10-CM

## 2014-03-02 DIAGNOSIS — S0193XD Puncture wound without foreign body of unspecified part of head, subsequent encounter: Secondary | ICD-10-CM

## 2014-03-02 DIAGNOSIS — W3400XD Accidental discharge from unspecified firearms or gun, subsequent encounter: Secondary | ICD-10-CM
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
## 2014-03-04 ENCOUNTER — Encounter (HOSPITAL_COMMUNITY): Payer: Self-pay

## 2014-03-04 ENCOUNTER — Ambulatory Visit (HOSPITAL_COMMUNITY)
Admission: RE | Admit: 2014-03-04 | Discharge: 2014-03-04 | Disposition: A | Discharge status: Home / Self Care | Payer: Medicare Other | Source: Ambulatory Visit | Attending: Physical Medicine & Rehabilitation | Admitting: Physical Medicine & Rehabilitation

## 2014-03-04 DIAGNOSIS — F09 Unspecified mental disorder due to known physiological condition: Secondary | ICD-10-CM

## 2014-03-04 DIAGNOSIS — S0193XD Puncture wound without foreign body of unspecified part of head, subsequent encounter: Secondary | ICD-10-CM

## 2014-03-04 DIAGNOSIS — S06890S Other specified intracranial injury without loss of consciousness, sequela: Secondary | ICD-10-CM | POA: Diagnosis not present

## 2014-03-04 DIAGNOSIS — G8110 Spastic hemiplegia affecting unspecified side: Secondary | ICD-10-CM

## 2014-03-04 DIAGNOSIS — R29898 Other symptoms and signs involving the musculoskeletal system: Secondary | ICD-10-CM

## 2014-03-04 DIAGNOSIS — M6281 Muscle weakness (generalized): Secondary | ICD-10-CM | POA: Diagnosis not present

## 2014-03-04 DIAGNOSIS — S069X0A Unspecified intracranial injury without loss of consciousness, initial encounter: Secondary | ICD-10-CM

## 2014-03-04 DIAGNOSIS — G8114 Spastic hemiplegia affecting left nondominant side: Secondary | ICD-10-CM | POA: Diagnosis not present

## 2014-03-04 DIAGNOSIS — I824Z2 Acute embolism and thrombosis of unspecified deep veins of left distal lower extremity: Secondary | ICD-10-CM

## 2014-03-04 DIAGNOSIS — R2689 Other abnormalities of gait and mobility: Secondary | ICD-10-CM

## 2014-03-04 DIAGNOSIS — R531 Weakness: Secondary | ICD-10-CM

## 2014-03-04 DIAGNOSIS — S0291XS Unspecified fracture of skull, sequela: Secondary | ICD-10-CM

## 2014-03-04 DIAGNOSIS — R269 Unspecified abnormalities of gait and mobility: Secondary | ICD-10-CM | POA: Diagnosis not present

## 2014-03-04 DIAGNOSIS — S0990XS Unspecified injury of head, sequela: Secondary | ICD-10-CM

## 2014-03-04 DIAGNOSIS — R471 Dysarthria and anarthria: Secondary | ICD-10-CM

## 2014-03-04 DIAGNOSIS — W3400XD Accidental discharge from unspecified firearms or gun, subsequent encounter: Secondary | ICD-10-CM
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
## 2014-03-05 ENCOUNTER — Ambulatory Visit (HOSPITAL_COMMUNITY)
Admission: RE | Admit: 2014-03-05 | Discharge: 2014-03-05 | Disposition: A | Discharge status: Home / Self Care | Payer: Medicare Other | Source: Ambulatory Visit | Attending: Physical Medicine & Rehabilitation | Admitting: Physical Medicine & Rehabilitation

## 2014-03-05 DIAGNOSIS — M6281 Muscle weakness (generalized): Secondary | ICD-10-CM | POA: Diagnosis not present

## 2014-03-05 DIAGNOSIS — R29898 Other symptoms and signs involving the musculoskeletal system: Secondary | ICD-10-CM

## 2014-03-05 DIAGNOSIS — R531 Weakness: Secondary | ICD-10-CM

## 2014-03-05 DIAGNOSIS — S06890S Other specified intracranial injury without loss of consciousness, sequela: Secondary | ICD-10-CM | POA: Diagnosis not present

## 2014-03-05 DIAGNOSIS — S069X0A Unspecified intracranial injury without loss of consciousness, initial encounter: Secondary | ICD-10-CM

## 2014-03-05 DIAGNOSIS — R269 Unspecified abnormalities of gait and mobility: Secondary | ICD-10-CM | POA: Diagnosis not present

## 2014-03-05 DIAGNOSIS — G8110 Spastic hemiplegia affecting unspecified side: Secondary | ICD-10-CM

## 2014-03-05 DIAGNOSIS — R2689 Other abnormalities of gait and mobility: Secondary | ICD-10-CM

## 2014-03-05 DIAGNOSIS — F09 Unspecified mental disorder due to known physiological condition: Secondary | ICD-10-CM

## 2014-03-05 DIAGNOSIS — S0990XS Unspecified injury of head, sequela: Secondary | ICD-10-CM

## 2014-03-05 DIAGNOSIS — R471 Dysarthria and anarthria: Secondary | ICD-10-CM

## 2014-03-05 DIAGNOSIS — G8114 Spastic hemiplegia affecting left nondominant side: Secondary | ICD-10-CM | POA: Diagnosis not present
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
## 2014-03-09 ENCOUNTER — Encounter: Payer: Medicare Other | Admitting: Physical Medicine & Rehabilitation

## 2014-03-11 ENCOUNTER — Encounter (HOSPITAL_COMMUNITY): Payer: Self-pay

## 2014-03-11 ENCOUNTER — Ambulatory Visit (HOSPITAL_COMMUNITY): Payer: Medicare Other

## 2014-03-11 DIAGNOSIS — R531 Weakness: Secondary | ICD-10-CM

## 2014-03-11 DIAGNOSIS — R269 Unspecified abnormalities of gait and mobility: Secondary | ICD-10-CM | POA: Diagnosis not present

## 2014-03-11 DIAGNOSIS — M6281 Muscle weakness (generalized): Secondary | ICD-10-CM | POA: Diagnosis not present

## 2014-03-11 DIAGNOSIS — S06890S Other specified intracranial injury without loss of consciousness, sequela: Secondary | ICD-10-CM | POA: Diagnosis not present

## 2014-03-11 DIAGNOSIS — G8110 Spastic hemiplegia affecting unspecified side: Secondary | ICD-10-CM

## 2014-03-11 DIAGNOSIS — G8114 Spastic hemiplegia affecting left nondominant side: Secondary | ICD-10-CM | POA: Diagnosis not present

## 2014-03-11 DIAGNOSIS — R2689 Other abnormalities of gait and mobility: Secondary | ICD-10-CM

## 2014-03-11 DIAGNOSIS — S069X0A Unspecified intracranial injury without loss of consciousness, initial encounter: Secondary | ICD-10-CM

## 2014-03-11 DIAGNOSIS — R29898 Other symptoms and signs involving the musculoskeletal system: Secondary | ICD-10-CM
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
## 2014-03-12 ENCOUNTER — Ambulatory Visit (HOSPITAL_COMMUNITY): Payer: Medicare Other | Admitting: Physical Therapy

## 2014-03-12 ENCOUNTER — Ambulatory Visit (HOSPITAL_COMMUNITY): Payer: Medicare Other | Admitting: Speech Pathology

## 2014-03-12 ENCOUNTER — Ambulatory Visit (HOSPITAL_COMMUNITY): Payer: Medicare Other | Admitting: Specialist

## 2014-03-12 ENCOUNTER — Telehealth: Payer: Self-pay | Admitting: Internal Medicine

## 2014-03-12 DIAGNOSIS — S06890S Other specified intracranial injury without loss of consciousness, sequela: Secondary | ICD-10-CM | POA: Diagnosis not present

## 2014-03-12 DIAGNOSIS — S069X0A Unspecified intracranial injury without loss of consciousness, initial encounter: Secondary | ICD-10-CM

## 2014-03-12 DIAGNOSIS — F09 Unspecified mental disorder due to known physiological condition: Secondary | ICD-10-CM

## 2014-03-12 DIAGNOSIS — S0291XS Unspecified fracture of skull, sequela: Secondary | ICD-10-CM

## 2014-03-12 DIAGNOSIS — G8110 Spastic hemiplegia affecting unspecified side: Secondary | ICD-10-CM

## 2014-03-12 DIAGNOSIS — R29898 Other symptoms and signs involving the musculoskeletal system: Secondary | ICD-10-CM

## 2014-03-12 DIAGNOSIS — W3400XD Accidental discharge from unspecified firearms or gun, subsequent encounter: Secondary | ICD-10-CM

## 2014-03-12 DIAGNOSIS — W3400XS Accidental discharge from unspecified firearms or gun, sequela: Secondary | ICD-10-CM

## 2014-03-12 DIAGNOSIS — R2689 Other abnormalities of gait and mobility: Secondary | ICD-10-CM

## 2014-03-12 DIAGNOSIS — S0990XS Unspecified injury of head, sequela: Principal | ICD-10-CM

## 2014-03-12 DIAGNOSIS — G8114 Spastic hemiplegia affecting left nondominant side: Secondary | ICD-10-CM | POA: Diagnosis not present

## 2014-03-12 DIAGNOSIS — S0193XS Puncture wound without foreign body of unspecified part of head, sequela: Secondary | ICD-10-CM

## 2014-03-12 DIAGNOSIS — R531 Weakness: Secondary | ICD-10-CM

## 2014-03-12 DIAGNOSIS — S0193XD Puncture wound without foreign body of unspecified part of head, subsequent encounter: Secondary | ICD-10-CM

## 2014-03-12 DIAGNOSIS — R269 Unspecified abnormalities of gait and mobility: Secondary | ICD-10-CM | POA: Diagnosis not present

## 2014-03-12 DIAGNOSIS — M6281 Muscle weakness (generalized): Secondary | ICD-10-CM | POA: Diagnosis not present

## 2014-03-12 DIAGNOSIS — I824Z2 Acute embolism and thrombosis of unspecified deep veins of left distal lower extremity: Secondary | ICD-10-CM

## 2014-03-12 MED ORDER — OMEPRAZOLE 40 MG PO CPDR: DELAYED_RELEASE_CAPSULE | ORAL | Status: DC

## 2014-03-12 NOTE — Patient Instructions (Signed)
Dressing: Lower Body   Cross leg tailor fashion to start clothing over foot. May use dressing aids to assist in pulling up. Keep back in neutral position. Avoid arching back or bending.  Copyright  VHI. All rights reserved.  Dressing: Shoes   Select slip-on shoes, shoes with Velcro fasteners, or with elastic laces. Use a long-handled shoehorn so you can slide your heel into shoe without bending over.   Copyright  VHI. All rights reserved.  Dressing: Putting on T-Shirt   With shirt facing down, guide affected arm from bottom opening through sleeve. Pull shirt up to shoulder and put other arm through sleeve. Hold back of neck opening to pull up and over head. Pull shirt down over trunk.  Copyright  VHI. All rights reserved.
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
## 2014-03-12 NOTE — Telephone Encounter (Signed)
Requesting refill of omeprazole (PRILOSEC) 40 MG capsule [[id number]] send to [facility]
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
## 2014-03-13 ENCOUNTER — Ambulatory Visit (HOSPITAL_COMMUNITY): Payer: Medicare Other

## 2014-03-13 ENCOUNTER — Ambulatory Visit (HOSPITAL_COMMUNITY): Payer: Medicare Other | Admitting: Specialist

## 2014-03-13 DIAGNOSIS — S069X0A Unspecified intracranial injury without loss of consciousness, initial encounter: Secondary | ICD-10-CM

## 2014-03-13 DIAGNOSIS — R2689 Other abnormalities of gait and mobility: Secondary | ICD-10-CM

## 2014-03-13 DIAGNOSIS — R531 Weakness: Secondary | ICD-10-CM

## 2014-03-13 DIAGNOSIS — M6281 Muscle weakness (generalized): Secondary | ICD-10-CM | POA: Diagnosis not present

## 2014-03-13 DIAGNOSIS — S06890S Other specified intracranial injury without loss of consciousness, sequela: Secondary | ICD-10-CM | POA: Diagnosis not present

## 2014-03-13 DIAGNOSIS — G8110 Spastic hemiplegia affecting unspecified side: Secondary | ICD-10-CM

## 2014-03-13 DIAGNOSIS — R269 Unspecified abnormalities of gait and mobility: Secondary | ICD-10-CM | POA: Diagnosis not present

## 2014-03-13 DIAGNOSIS — G8114 Spastic hemiplegia affecting left nondominant side: Secondary | ICD-10-CM | POA: Diagnosis not present

## 2014-03-13 DIAGNOSIS — R29898 Other symptoms and signs involving the musculoskeletal system: Secondary | ICD-10-CM
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
## 2014-03-16 ENCOUNTER — Ambulatory Visit (INDEPENDENT_AMBULATORY_CARE_PROVIDER_SITE_OTHER): Payer: Medicare Other | Admitting: *Deleted

## 2014-03-16 ENCOUNTER — Ambulatory Visit (HOSPITAL_COMMUNITY): Payer: Medicare Other | Admitting: Speech Pathology

## 2014-03-16 ENCOUNTER — Ambulatory Visit (HOSPITAL_COMMUNITY): Payer: Medicare Other | Admitting: Physical Therapy

## 2014-03-16 ENCOUNTER — Encounter (HOSPITAL_COMMUNITY): Payer: Self-pay

## 2014-03-16 ENCOUNTER — Ambulatory Visit (HOSPITAL_COMMUNITY): Payer: Medicare Other

## 2014-03-16 DIAGNOSIS — S0183XS Puncture wound without foreign body of other part of head, sequela: Secondary | ICD-10-CM

## 2014-03-16 DIAGNOSIS — R269 Unspecified abnormalities of gait and mobility: Secondary | ICD-10-CM | POA: Diagnosis not present

## 2014-03-16 DIAGNOSIS — R531 Weakness: Secondary | ICD-10-CM

## 2014-03-16 DIAGNOSIS — M6281 Muscle weakness (generalized): Secondary | ICD-10-CM | POA: Diagnosis not present

## 2014-03-16 DIAGNOSIS — S0193XS Puncture wound without foreign body of unspecified part of head, sequela: Secondary | ICD-10-CM

## 2014-03-16 DIAGNOSIS — S0990XS Unspecified injury of head, sequela: Principal | ICD-10-CM

## 2014-03-16 DIAGNOSIS — G8110 Spastic hemiplegia affecting unspecified side: Secondary | ICD-10-CM

## 2014-03-16 DIAGNOSIS — I824Z2 Acute embolism and thrombosis of unspecified deep veins of left distal lower extremity: Secondary | ICD-10-CM

## 2014-03-16 DIAGNOSIS — I480 Paroxysmal atrial fibrillation: Secondary | ICD-10-CM | POA: Diagnosis not present

## 2014-03-16 DIAGNOSIS — S0291XS Unspecified fracture of skull, sequela: Secondary | ICD-10-CM

## 2014-03-16 DIAGNOSIS — Z7901 Long term (current) use of anticoagulants: Secondary | ICD-10-CM

## 2014-03-16 DIAGNOSIS — W3400XS Accidental discharge from unspecified firearms or gun, sequela: Secondary | ICD-10-CM

## 2014-03-16 DIAGNOSIS — W3400XD Accidental discharge from unspecified firearms or gun, subsequent encounter: Secondary | ICD-10-CM

## 2014-03-16 DIAGNOSIS — R29898 Other symptoms and signs involving the musculoskeletal system: Secondary | ICD-10-CM

## 2014-03-16 DIAGNOSIS — F09 Unspecified mental disorder due to known physiological condition: Secondary | ICD-10-CM

## 2014-03-16 DIAGNOSIS — G8114 Spastic hemiplegia affecting left nondominant side: Secondary | ICD-10-CM | POA: Diagnosis not present

## 2014-03-16 DIAGNOSIS — S0193XD Puncture wound without foreign body of unspecified part of head, subsequent encounter: Secondary | ICD-10-CM

## 2014-03-16 DIAGNOSIS — S06890S Other specified intracranial injury without loss of consciousness, sequela: Secondary | ICD-10-CM | POA: Diagnosis not present

## 2014-03-16 DIAGNOSIS — S069X0A Unspecified intracranial injury without loss of consciousness, initial encounter: Secondary | ICD-10-CM

## 2014-03-16 DIAGNOSIS — R2689 Other abnormalities of gait and mobility: Secondary | ICD-10-CM

## 2014-03-16 LAB — POCT INR: INR: 2.5
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
## 2014-03-19 ENCOUNTER — Encounter (HOSPITAL_COMMUNITY): Payer: Self-pay

## 2014-03-19 ENCOUNTER — Ambulatory Visit (HOSPITAL_COMMUNITY): Payer: Medicare Other

## 2014-03-19 ENCOUNTER — Ambulatory Visit (HOSPITAL_COMMUNITY): Payer: Medicare Other | Admitting: Speech Pathology

## 2014-03-19 DIAGNOSIS — S06890S Other specified intracranial injury without loss of consciousness, sequela: Secondary | ICD-10-CM | POA: Diagnosis not present

## 2014-03-19 DIAGNOSIS — R531 Weakness: Secondary | ICD-10-CM

## 2014-03-19 DIAGNOSIS — F09 Unspecified mental disorder due to known physiological condition: Secondary | ICD-10-CM

## 2014-03-19 DIAGNOSIS — M6281 Muscle weakness (generalized): Secondary | ICD-10-CM | POA: Diagnosis not present

## 2014-03-19 DIAGNOSIS — R269 Unspecified abnormalities of gait and mobility: Secondary | ICD-10-CM | POA: Diagnosis not present

## 2014-03-19 DIAGNOSIS — R2689 Other abnormalities of gait and mobility: Secondary | ICD-10-CM

## 2014-03-19 DIAGNOSIS — G8114 Spastic hemiplegia affecting left nondominant side: Secondary | ICD-10-CM | POA: Diagnosis not present

## 2014-03-19 DIAGNOSIS — S0990XS Unspecified injury of head, sequela: Principal | ICD-10-CM

## 2014-03-19 DIAGNOSIS — S069X0A Unspecified intracranial injury without loss of consciousness, initial encounter: Secondary | ICD-10-CM

## 2014-03-19 DIAGNOSIS — R29898 Other symptoms and signs involving the musculoskeletal system: Secondary | ICD-10-CM
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
## 2014-03-23 ENCOUNTER — Ambulatory Visit (HOSPITAL_COMMUNITY): Payer: Medicare Other | Admitting: Physical Therapy

## 2014-03-23 ENCOUNTER — Ambulatory Visit (HOSPITAL_COMMUNITY): Payer: Medicare Other | Admitting: Speech Pathology

## 2014-03-23 ENCOUNTER — Encounter (HOSPITAL_COMMUNITY): Payer: Self-pay

## 2014-03-23 ENCOUNTER — Ambulatory Visit (HOSPITAL_COMMUNITY): Payer: Medicare Other

## 2014-03-23 DIAGNOSIS — S0990XS Unspecified injury of head, sequela: Principal | ICD-10-CM

## 2014-03-23 DIAGNOSIS — S06890S Other specified intracranial injury without loss of consciousness, sequela: Secondary | ICD-10-CM | POA: Diagnosis not present

## 2014-03-23 DIAGNOSIS — R269 Unspecified abnormalities of gait and mobility: Secondary | ICD-10-CM | POA: Diagnosis not present

## 2014-03-23 DIAGNOSIS — F09 Unspecified mental disorder due to known physiological condition: Secondary | ICD-10-CM

## 2014-03-23 DIAGNOSIS — R531 Weakness: Secondary | ICD-10-CM

## 2014-03-23 DIAGNOSIS — M6281 Muscle weakness (generalized): Secondary | ICD-10-CM | POA: Diagnosis not present

## 2014-03-23 DIAGNOSIS — R471 Dysarthria and anarthria: Secondary | ICD-10-CM

## 2014-03-23 DIAGNOSIS — R29898 Other symptoms and signs involving the musculoskeletal system: Secondary | ICD-10-CM

## 2014-03-23 DIAGNOSIS — G8114 Spastic hemiplegia affecting left nondominant side: Secondary | ICD-10-CM | POA: Diagnosis not present

## 2014-03-23 DIAGNOSIS — R2689 Other abnormalities of gait and mobility: Secondary | ICD-10-CM
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
## 2014-03-26 ENCOUNTER — Ambulatory Visit (HOSPITAL_COMMUNITY): Payer: Medicare Other | Admitting: Speech Pathology

## 2014-03-26 ENCOUNTER — Encounter (HOSPITAL_COMMUNITY): Payer: Self-pay | Admitting: Occupational Therapy

## 2014-03-26 ENCOUNTER — Ambulatory Visit (HOSPITAL_COMMUNITY): Payer: Medicare Other | Attending: Physical Medicine & Rehabilitation | Admitting: Occupational Therapy

## 2014-03-26 ENCOUNTER — Ambulatory Visit (HOSPITAL_COMMUNITY): Payer: Medicare Other | Admitting: Physical Therapy

## 2014-03-26 DIAGNOSIS — R29898 Other symptoms and signs involving the musculoskeletal system: Secondary | ICD-10-CM

## 2014-03-26 DIAGNOSIS — W3400XS Accidental discharge from unspecified firearms or gun, sequela: Secondary | ICD-10-CM

## 2014-03-26 DIAGNOSIS — F09 Unspecified mental disorder due to known physiological condition: Secondary | ICD-10-CM

## 2014-03-26 DIAGNOSIS — R269 Unspecified abnormalities of gait and mobility: Secondary | ICD-10-CM | POA: Insufficient documentation

## 2014-03-26 DIAGNOSIS — R531 Weakness: Secondary | ICD-10-CM

## 2014-03-26 DIAGNOSIS — R2689 Other abnormalities of gait and mobility: Secondary | ICD-10-CM

## 2014-03-26 DIAGNOSIS — S06890S Other specified intracranial injury without loss of consciousness, sequela: Secondary | ICD-10-CM | POA: Insufficient documentation

## 2014-03-26 DIAGNOSIS — S0193XS Puncture wound without foreign body of unspecified part of head, sequela: Secondary | ICD-10-CM

## 2014-03-26 DIAGNOSIS — G8114 Spastic hemiplegia affecting left nondominant side: Secondary | ICD-10-CM | POA: Insufficient documentation

## 2014-03-26 DIAGNOSIS — G8110 Spastic hemiplegia affecting unspecified side: Secondary | ICD-10-CM

## 2014-03-26 DIAGNOSIS — S069X0A Unspecified intracranial injury without loss of consciousness, initial encounter: Secondary | ICD-10-CM

## 2014-03-26 DIAGNOSIS — S0990XS Unspecified injury of head, sequela: Principal | ICD-10-CM

## 2014-03-26 DIAGNOSIS — M6281 Muscle weakness (generalized): Secondary | ICD-10-CM | POA: Insufficient documentation
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
## 2014-03-27 ENCOUNTER — Telehealth (HOSPITAL_COMMUNITY): Payer: Self-pay | Admitting: Specialist

## 2014-03-27 NOTE — Telephone Encounter (Signed)
Mrs. Scheibel called today, concerned about her husbands discharge from physical therapy on 03/26/14.  She would like to continue the month out, or continue coming as self pay.  Unfortunately, the PT that reevaluted him yesterday is on PAL until 04/06/14.  We agreed to wait until 3/14 when PT returns to decided if he would continue with his PT. Hazeline Junker

## 2014-03-30 ENCOUNTER — Encounter (HOSPITAL_COMMUNITY): Payer: Self-pay | Admitting: Occupational Therapy

## 2014-03-30 ENCOUNTER — Encounter (HOSPITAL_COMMUNITY): Payer: Medicare Other | Admitting: Speech Pathology

## 2014-03-30 ENCOUNTER — Ambulatory Visit (HOSPITAL_COMMUNITY): Payer: Medicare Other | Admitting: Physical Therapy

## 2014-03-30 ENCOUNTER — Ambulatory Visit (HOSPITAL_COMMUNITY): Payer: Medicare Other | Admitting: Occupational Therapy

## 2014-03-30 DIAGNOSIS — R269 Unspecified abnormalities of gait and mobility: Secondary | ICD-10-CM | POA: Diagnosis not present

## 2014-03-30 DIAGNOSIS — S06890S Other specified intracranial injury without loss of consciousness, sequela: Secondary | ICD-10-CM | POA: Diagnosis not present

## 2014-03-30 DIAGNOSIS — G8114 Spastic hemiplegia affecting left nondominant side: Secondary | ICD-10-CM | POA: Diagnosis not present

## 2014-03-30 DIAGNOSIS — G8110 Spastic hemiplegia affecting unspecified side: Secondary | ICD-10-CM

## 2014-03-30 DIAGNOSIS — M6281 Muscle weakness (generalized): Secondary | ICD-10-CM | POA: Diagnosis not present

## 2014-03-30 DIAGNOSIS — R531 Weakness: Secondary | ICD-10-CM
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
## 2014-04-02 ENCOUNTER — Ambulatory Visit (HOSPITAL_COMMUNITY): Payer: Medicare Other | Admitting: Physical Therapy

## 2014-04-02 ENCOUNTER — Ambulatory Visit (HOSPITAL_COMMUNITY): Payer: Medicare Other

## 2014-04-02 ENCOUNTER — Encounter (HOSPITAL_COMMUNITY): Payer: Self-pay

## 2014-04-02 ENCOUNTER — Encounter (HOSPITAL_COMMUNITY): Payer: Medicare Other | Admitting: Speech Pathology

## 2014-04-02 DIAGNOSIS — R531 Weakness: Secondary | ICD-10-CM

## 2014-04-02 DIAGNOSIS — M6281 Muscle weakness (generalized): Secondary | ICD-10-CM | POA: Diagnosis not present

## 2014-04-02 DIAGNOSIS — S06890S Other specified intracranial injury without loss of consciousness, sequela: Secondary | ICD-10-CM | POA: Diagnosis not present

## 2014-04-02 DIAGNOSIS — R269 Unspecified abnormalities of gait and mobility: Secondary | ICD-10-CM | POA: Diagnosis not present

## 2014-04-02 DIAGNOSIS — G8114 Spastic hemiplegia affecting left nondominant side: Secondary | ICD-10-CM | POA: Diagnosis not present
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
## 2014-04-06 ENCOUNTER — Ambulatory Visit (HOSPITAL_COMMUNITY): Payer: Medicare Other

## 2014-04-06 ENCOUNTER — Encounter (HOSPITAL_COMMUNITY): Payer: Medicare Other | Admitting: Speech Pathology

## 2014-04-06 ENCOUNTER — Encounter (HOSPITAL_COMMUNITY): Payer: Self-pay

## 2014-04-06 ENCOUNTER — Ambulatory Visit (HOSPITAL_COMMUNITY): Payer: Medicare Other | Admitting: Physical Therapy

## 2014-04-06 DIAGNOSIS — R269 Unspecified abnormalities of gait and mobility: Secondary | ICD-10-CM | POA: Diagnosis not present

## 2014-04-06 DIAGNOSIS — M436 Torticollis: Secondary | ICD-10-CM

## 2014-04-06 DIAGNOSIS — M6281 Muscle weakness (generalized): Secondary | ICD-10-CM | POA: Diagnosis not present

## 2014-04-06 DIAGNOSIS — R531 Weakness: Secondary | ICD-10-CM

## 2014-04-06 DIAGNOSIS — G8114 Spastic hemiplegia affecting left nondominant side: Secondary | ICD-10-CM | POA: Diagnosis not present

## 2014-04-06 DIAGNOSIS — M5382 Other specified dorsopathies, cervical region: Secondary | ICD-10-CM

## 2014-04-06 DIAGNOSIS — S06890S Other specified intracranial injury without loss of consciousness, sequela: Secondary | ICD-10-CM | POA: Diagnosis not present
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
## 2014-04-08 ENCOUNTER — Telehealth: Payer: Self-pay | Admitting: *Deleted

## 2014-04-08 NOTE — Telephone Encounter (Signed)
[name] scheduled for dental cleaning 3/30.  Wife wants to know if pt needs to hold coumadin.  Informed her he does not need to hold coumadin.

## 2014-04-08 NOTE — Telephone Encounter (Signed)
Patient's wife would like return phone call. / tg

## 2014-04-09 ENCOUNTER — Ambulatory Visit (HOSPITAL_COMMUNITY): Payer: Medicare Other | Admitting: Specialist

## 2014-04-09 ENCOUNTER — Encounter (HOSPITAL_COMMUNITY): Payer: Medicare Other | Admitting: Speech Pathology

## 2014-04-09 ENCOUNTER — Ambulatory Visit (HOSPITAL_COMMUNITY): Payer: Medicare Other | Admitting: Physical Therapy

## 2014-04-09 DIAGNOSIS — M6281 Muscle weakness (generalized): Secondary | ICD-10-CM | POA: Diagnosis not present

## 2014-04-09 DIAGNOSIS — M436 Torticollis: Secondary | ICD-10-CM

## 2014-04-09 DIAGNOSIS — R531 Weakness: Secondary | ICD-10-CM

## 2014-04-09 DIAGNOSIS — G8114 Spastic hemiplegia affecting left nondominant side: Secondary | ICD-10-CM | POA: Diagnosis not present

## 2014-04-09 DIAGNOSIS — R269 Unspecified abnormalities of gait and mobility: Secondary | ICD-10-CM | POA: Diagnosis not present

## 2014-04-09 DIAGNOSIS — S06890S Other specified intracranial injury without loss of consciousness, sequela: Secondary | ICD-10-CM | POA: Diagnosis not present

## 2014-04-09 DIAGNOSIS — G8110 Spastic hemiplegia affecting unspecified side: Secondary | ICD-10-CM

## 2014-04-09 DIAGNOSIS — S069X0A Unspecified intracranial injury without loss of consciousness, initial encounter: Secondary | ICD-10-CM

## 2014-04-09 DIAGNOSIS — M5382 Other specified dorsopathies, cervical region: Secondary | ICD-10-CM
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
## 2014-04-13 ENCOUNTER — Ambulatory Visit (INDEPENDENT_AMBULATORY_CARE_PROVIDER_SITE_OTHER): Payer: Medicare Other | Admitting: *Deleted

## 2014-04-13 ENCOUNTER — Ambulatory Visit (HOSPITAL_COMMUNITY): Payer: Medicare Other | Admitting: Physical Therapy

## 2014-04-13 ENCOUNTER — Encounter (HOSPITAL_COMMUNITY): Payer: Medicare Other | Admitting: Speech Pathology

## 2014-04-13 ENCOUNTER — Ambulatory Visit (HOSPITAL_COMMUNITY): Payer: Medicare Other

## 2014-04-13 ENCOUNTER — Encounter (HOSPITAL_COMMUNITY): Payer: Self-pay

## 2014-04-13 DIAGNOSIS — M5382 Other specified dorsopathies, cervical region: Secondary | ICD-10-CM

## 2014-04-13 DIAGNOSIS — M436 Torticollis: Secondary | ICD-10-CM

## 2014-04-13 DIAGNOSIS — M6281 Muscle weakness (generalized): Secondary | ICD-10-CM | POA: Diagnosis not present

## 2014-04-13 DIAGNOSIS — S06890S Other specified intracranial injury without loss of consciousness, sequela: Secondary | ICD-10-CM | POA: Diagnosis not present

## 2014-04-13 DIAGNOSIS — G8114 Spastic hemiplegia affecting left nondominant side: Secondary | ICD-10-CM | POA: Diagnosis not present

## 2014-04-13 DIAGNOSIS — G8110 Spastic hemiplegia affecting unspecified side: Secondary | ICD-10-CM

## 2014-04-13 DIAGNOSIS — I480 Paroxysmal atrial fibrillation: Secondary | ICD-10-CM | POA: Diagnosis not present

## 2014-04-13 DIAGNOSIS — R531 Weakness: Secondary | ICD-10-CM

## 2014-04-13 DIAGNOSIS — S069X0A Unspecified intracranial injury without loss of consciousness, initial encounter: Secondary | ICD-10-CM

## 2014-04-13 DIAGNOSIS — Z7901 Long term (current) use of anticoagulants: Secondary | ICD-10-CM | POA: Diagnosis not present

## 2014-04-13 DIAGNOSIS — R269 Unspecified abnormalities of gait and mobility: Secondary | ICD-10-CM | POA: Diagnosis not present

## 2014-04-13 LAB — POCT INR: INR: 2.2
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
## 2014-04-16 ENCOUNTER — Encounter (HOSPITAL_COMMUNITY): Payer: Medicare Other | Admitting: Speech Pathology

## 2014-04-16 ENCOUNTER — Encounter (HOSPITAL_COMMUNITY): Payer: Self-pay

## 2014-04-16 ENCOUNTER — Ambulatory Visit (HOSPITAL_COMMUNITY): Payer: Medicare Other | Admitting: Physical Therapy

## 2014-04-16 ENCOUNTER — Ambulatory Visit (HOSPITAL_COMMUNITY): Payer: Medicare Other

## 2014-04-16 DIAGNOSIS — M5382 Other specified dorsopathies, cervical region: Secondary | ICD-10-CM

## 2014-04-16 DIAGNOSIS — G8114 Spastic hemiplegia affecting left nondominant side: Secondary | ICD-10-CM | POA: Diagnosis not present

## 2014-04-16 DIAGNOSIS — G8110 Spastic hemiplegia affecting unspecified side: Secondary | ICD-10-CM

## 2014-04-16 DIAGNOSIS — R531 Weakness: Secondary | ICD-10-CM

## 2014-04-16 DIAGNOSIS — Z789 Other specified health status: Secondary | ICD-10-CM

## 2014-04-16 DIAGNOSIS — F09 Unspecified mental disorder due to known physiological condition: Secondary | ICD-10-CM

## 2014-04-16 DIAGNOSIS — R29898 Other symptoms and signs involving the musculoskeletal system: Secondary | ICD-10-CM

## 2014-04-16 DIAGNOSIS — S0990XS Unspecified injury of head, sequela: Secondary | ICD-10-CM

## 2014-04-16 DIAGNOSIS — S069X0A Unspecified intracranial injury without loss of consciousness, initial encounter: Secondary | ICD-10-CM

## 2014-04-16 DIAGNOSIS — R269 Unspecified abnormalities of gait and mobility: Secondary | ICD-10-CM | POA: Diagnosis not present

## 2014-04-16 DIAGNOSIS — W3400XS Accidental discharge from unspecified firearms or gun, sequela: Secondary | ICD-10-CM

## 2014-04-16 DIAGNOSIS — S06890S Other specified intracranial injury without loss of consciousness, sequela: Secondary | ICD-10-CM | POA: Diagnosis not present

## 2014-04-16 DIAGNOSIS — M6281 Muscle weakness (generalized): Secondary | ICD-10-CM | POA: Diagnosis not present

## 2014-04-16 DIAGNOSIS — M436 Torticollis: Secondary | ICD-10-CM

## 2014-04-16 DIAGNOSIS — S0193XS Puncture wound without foreign body of unspecified part of head, sequela: Secondary | ICD-10-CM

## 2014-04-16 DIAGNOSIS — R2689 Other abnormalities of gait and mobility: Secondary | ICD-10-CM
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
## 2014-04-20 ENCOUNTER — Encounter (HOSPITAL_COMMUNITY): Payer: Medicare Other | Admitting: Speech Pathology

## 2014-04-20 ENCOUNTER — Ambulatory Visit (HOSPITAL_COMMUNITY): Payer: Medicare Other

## 2014-04-20 ENCOUNTER — Encounter (HOSPITAL_COMMUNITY): Payer: Self-pay

## 2014-04-20 ENCOUNTER — Ambulatory Visit (HOSPITAL_COMMUNITY): Payer: Medicare Other | Admitting: Physical Therapy

## 2014-04-20 DIAGNOSIS — S06890S Other specified intracranial injury without loss of consciousness, sequela: Secondary | ICD-10-CM | POA: Diagnosis not present

## 2014-04-20 DIAGNOSIS — Z789 Other specified health status: Secondary | ICD-10-CM

## 2014-04-20 DIAGNOSIS — R269 Unspecified abnormalities of gait and mobility: Secondary | ICD-10-CM | POA: Diagnosis not present

## 2014-04-20 DIAGNOSIS — S0990XS Unspecified injury of head, sequela: Secondary | ICD-10-CM

## 2014-04-20 DIAGNOSIS — G8114 Spastic hemiplegia affecting left nondominant side: Secondary | ICD-10-CM | POA: Diagnosis not present

## 2014-04-20 DIAGNOSIS — F09 Unspecified mental disorder due to known physiological condition: Secondary | ICD-10-CM

## 2014-04-20 DIAGNOSIS — M6281 Muscle weakness (generalized): Secondary | ICD-10-CM | POA: Diagnosis not present

## 2014-04-20 DIAGNOSIS — M436 Torticollis: Secondary | ICD-10-CM

## 2014-04-20 DIAGNOSIS — G8110 Spastic hemiplegia affecting unspecified side: Secondary | ICD-10-CM
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
## 2014-04-21 ENCOUNTER — Ambulatory Visit: Payer: Medicare Other | Admitting: Physical Medicine & Rehabilitation

## 2014-04-22 ENCOUNTER — Ambulatory Visit: Payer: Medicare Other | Admitting: Physical Medicine & Rehabilitation

## 2014-04-23 ENCOUNTER — Ambulatory Visit (HOSPITAL_COMMUNITY): Payer: Medicare Other

## 2014-04-23 ENCOUNTER — Encounter (HOSPITAL_COMMUNITY): Payer: Medicare Other | Admitting: Speech Pathology

## 2014-04-23 ENCOUNTER — Encounter (HOSPITAL_COMMUNITY): Payer: Self-pay

## 2014-04-23 DIAGNOSIS — R2689 Other abnormalities of gait and mobility: Secondary | ICD-10-CM

## 2014-04-23 DIAGNOSIS — M436 Torticollis: Secondary | ICD-10-CM

## 2014-04-23 DIAGNOSIS — S069X0A Unspecified intracranial injury without loss of consciousness, initial encounter: Secondary | ICD-10-CM

## 2014-04-23 DIAGNOSIS — Z789 Other specified health status: Secondary | ICD-10-CM

## 2014-04-23 DIAGNOSIS — M6281 Muscle weakness (generalized): Secondary | ICD-10-CM | POA: Diagnosis not present

## 2014-04-23 DIAGNOSIS — F09 Unspecified mental disorder due to known physiological condition: Secondary | ICD-10-CM

## 2014-04-23 DIAGNOSIS — S0990XS Unspecified injury of head, sequela: Secondary | ICD-10-CM

## 2014-04-23 DIAGNOSIS — S06890S Other specified intracranial injury without loss of consciousness, sequela: Secondary | ICD-10-CM | POA: Diagnosis not present

## 2014-04-23 DIAGNOSIS — R269 Unspecified abnormalities of gait and mobility: Secondary | ICD-10-CM | POA: Diagnosis not present

## 2014-04-23 DIAGNOSIS — R531 Weakness: Secondary | ICD-10-CM

## 2014-04-23 DIAGNOSIS — R29898 Other symptoms and signs involving the musculoskeletal system: Secondary | ICD-10-CM

## 2014-04-23 DIAGNOSIS — G8114 Spastic hemiplegia affecting left nondominant side: Secondary | ICD-10-CM | POA: Diagnosis not present

## 2014-04-23 DIAGNOSIS — M5382 Other specified dorsopathies, cervical region: Secondary | ICD-10-CM
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
## 2014-04-28 ENCOUNTER — Ambulatory Visit (HOSPITAL_COMMUNITY): Payer: Medicare Other | Attending: Physical Medicine & Rehabilitation

## 2014-04-28 ENCOUNTER — Ambulatory Visit (HOSPITAL_COMMUNITY): Payer: Medicare Other | Admitting: Physical Therapy

## 2014-04-28 ENCOUNTER — Encounter (HOSPITAL_COMMUNITY): Payer: Self-pay

## 2014-04-28 DIAGNOSIS — R269 Unspecified abnormalities of gait and mobility: Secondary | ICD-10-CM | POA: Diagnosis not present

## 2014-04-28 DIAGNOSIS — M436 Torticollis: Secondary | ICD-10-CM

## 2014-04-28 DIAGNOSIS — M5382 Other specified dorsopathies, cervical region: Secondary | ICD-10-CM

## 2014-04-28 DIAGNOSIS — G8114 Spastic hemiplegia affecting left nondominant side: Secondary | ICD-10-CM | POA: Insufficient documentation

## 2014-04-28 DIAGNOSIS — R2689 Other abnormalities of gait and mobility: Secondary | ICD-10-CM

## 2014-04-28 DIAGNOSIS — Z789 Other specified health status: Secondary | ICD-10-CM

## 2014-04-28 DIAGNOSIS — S06890S Other specified intracranial injury without loss of consciousness, sequela: Secondary | ICD-10-CM | POA: Insufficient documentation

## 2014-04-28 DIAGNOSIS — R531 Weakness: Secondary | ICD-10-CM

## 2014-04-28 DIAGNOSIS — M6281 Muscle weakness (generalized): Secondary | ICD-10-CM | POA: Insufficient documentation
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
## 2014-04-30 ENCOUNTER — Encounter (HOSPITAL_COMMUNITY): Payer: Self-pay

## 2014-04-30 ENCOUNTER — Ambulatory Visit (HOSPITAL_COMMUNITY): Payer: Medicare Other

## 2014-04-30 ENCOUNTER — Ambulatory Visit (HOSPITAL_COMMUNITY): Payer: Medicare Other | Admitting: Physical Therapy

## 2014-04-30 DIAGNOSIS — G8114 Spastic hemiplegia affecting left nondominant side: Secondary | ICD-10-CM | POA: Diagnosis not present

## 2014-04-30 DIAGNOSIS — S06890S Other specified intracranial injury without loss of consciousness, sequela: Secondary | ICD-10-CM | POA: Diagnosis not present

## 2014-04-30 DIAGNOSIS — Z789 Other specified health status: Secondary | ICD-10-CM

## 2014-04-30 DIAGNOSIS — M436 Torticollis: Secondary | ICD-10-CM

## 2014-04-30 DIAGNOSIS — M6281 Muscle weakness (generalized): Secondary | ICD-10-CM | POA: Diagnosis not present

## 2014-04-30 DIAGNOSIS — R2689 Other abnormalities of gait and mobility: Secondary | ICD-10-CM

## 2014-04-30 DIAGNOSIS — M5382 Other specified dorsopathies, cervical region: Secondary | ICD-10-CM

## 2014-04-30 DIAGNOSIS — R269 Unspecified abnormalities of gait and mobility: Secondary | ICD-10-CM | POA: Diagnosis not present
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
## 2014-05-05 ENCOUNTER — Encounter: Payer: Medicare Other | Attending: Physical Medicine & Rehabilitation | Admitting: Physical Medicine & Rehabilitation

## 2014-05-05 ENCOUNTER — Ambulatory Visit (HOSPITAL_COMMUNITY): Payer: Medicare Other

## 2014-05-05 ENCOUNTER — Encounter (HOSPITAL_COMMUNITY): Payer: Self-pay

## 2014-05-05 ENCOUNTER — Encounter: Payer: Self-pay | Admitting: Physical Medicine & Rehabilitation

## 2014-05-05 VITALS — BP 137/60 | HR 73 | Resp 16

## 2014-05-05 DIAGNOSIS — R29898 Other symptoms and signs involving the musculoskeletal system: Secondary | ICD-10-CM | POA: Insufficient documentation

## 2014-05-05 DIAGNOSIS — M5382 Other specified dorsopathies, cervical region: Secondary | ICD-10-CM | POA: Insufficient documentation

## 2014-05-05 DIAGNOSIS — G8110 Spastic hemiplegia affecting unspecified side: Secondary | ICD-10-CM | POA: Diagnosis not present

## 2014-05-05 DIAGNOSIS — R2689 Other abnormalities of gait and mobility: Secondary | ICD-10-CM | POA: Diagnosis not present

## 2014-05-05 DIAGNOSIS — M436 Torticollis: Secondary | ICD-10-CM

## 2014-05-05 DIAGNOSIS — S06890S Other specified intracranial injury without loss of consciousness, sequela: Secondary | ICD-10-CM | POA: Diagnosis not present

## 2014-05-05 DIAGNOSIS — Z789 Other specified health status: Secondary | ICD-10-CM | POA: Diagnosis not present

## 2014-05-05 DIAGNOSIS — R531 Weakness: Secondary | ICD-10-CM | POA: Diagnosis not present

## 2014-05-05 DIAGNOSIS — S069X5S Unspecified intracranial injury with loss of consciousness greater than 24 hours with return to pre-existing conscious level, sequela: Secondary | ICD-10-CM

## 2014-05-05 DIAGNOSIS — F09 Unspecified mental disorder due to known physiological condition: Secondary | ICD-10-CM

## 2014-05-05 DIAGNOSIS — G8114 Spastic hemiplegia affecting left nondominant side: Secondary | ICD-10-CM

## 2014-05-05 DIAGNOSIS — R269 Unspecified abnormalities of gait and mobility: Secondary | ICD-10-CM | POA: Diagnosis not present

## 2014-05-05 DIAGNOSIS — S0990XS Unspecified injury of head, sequela: Secondary | ICD-10-CM

## 2014-05-05 DIAGNOSIS — M6281 Muscle weakness (generalized): Secondary | ICD-10-CM | POA: Diagnosis not present

## 2014-05-05 MED ORDER — BACLOFEN 10 MG PO TABS: 5.0000 mg | ORAL_TABLET | Freq: Three times a day (TID) | ORAL | Status: DC

## 2014-05-05 NOTE — Patient Instructions (Signed)
I WOULD LIKE FOR YOU TO CONTINUE WEARING THE WRIST SPLINT AT NIGHT.  PLEASE CALL ME WITH ANY PROBLEMS OR QUESTIONS (#[phone]).
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
## 2014-05-06 ENCOUNTER — Ambulatory Visit (INDEPENDENT_AMBULATORY_CARE_PROVIDER_SITE_OTHER): Payer: Medicare Other | Admitting: Internal Medicine

## 2014-05-06 ENCOUNTER — Encounter: Payer: Self-pay | Admitting: Internal Medicine

## 2014-05-06 ENCOUNTER — Telehealth: Payer: Self-pay | Admitting: *Deleted

## 2014-05-06 ENCOUNTER — Other Ambulatory Visit (INDEPENDENT_AMBULATORY_CARE_PROVIDER_SITE_OTHER): Payer: Medicare Other

## 2014-05-06 VITALS — BP 128/80 | HR 81 | Temp 98.5°F | Resp 18 | Ht 66.0 in | Wt 178.0 lb

## 2014-05-06 DIAGNOSIS — E785 Hyperlipidemia, unspecified: Secondary | ICD-10-CM

## 2014-05-06 DIAGNOSIS — S069X0A Unspecified intracranial injury without loss of consciousness, initial encounter: Secondary | ICD-10-CM

## 2014-05-06 DIAGNOSIS — I10 Essential (primary) hypertension: Secondary | ICD-10-CM | POA: Diagnosis not present

## 2014-05-06 LAB — CBC WITH DIFFERENTIAL/PLATELET
Basophils Absolute: 0 10*3/uL (ref 0.0–0.1)
Basophils Relative: 0.3 % (ref 0.0–3.0)
Eosinophils Absolute: 0.2 10*3/uL (ref 0.0–0.7)
Eosinophils Relative: 1.9 % (ref 0.0–5.0)
HCT: 35.6 % — ABNORMAL LOW (ref 39.0–52.0)
Hemoglobin: 12.2 g/dL — ABNORMAL LOW (ref 13.0–17.0)
Lymphocytes Relative: 18.7 % (ref 12.0–46.0)
Lymphs Abs: 1.7 10*3/uL (ref 0.7–4.0)
MCHC: 34.2 g/dL (ref 30.0–36.0)
MCV: 90.7 fl (ref 78.0–100.0)
Monocytes Absolute: 1 10*3/uL (ref 0.1–1.0)
Monocytes Relative: 10.5 % (ref 3.0–12.0)
Neutro Abs: 6.4 10*3/uL (ref 1.4–7.7)
Neutrophils Relative %: 68.6 % (ref 43.0–77.0)
Platelets: 231 10*3/uL (ref 150.0–400.0)
RBC: 3.93 Mil/uL — ABNORMAL LOW (ref 4.22–5.81)
RDW: 14 % (ref 11.5–15.5)
WBC: 9.3 10*3/uL (ref 4.0–10.5)

## 2014-05-06 LAB — BASIC METABOLIC PANEL
BUN: 27 mg/dL — ABNORMAL HIGH (ref 6–23)
CO2: 30 mEq/L (ref 19–32)
Calcium: 9.5 mg/dL (ref 8.4–10.5)
Chloride: 105 mEq/L (ref 96–112)
Creatinine, Ser: 1.81 mg/dL — ABNORMAL HIGH (ref 0.40–1.50)
GFR: 38.6 mL/min — ABNORMAL LOW (ref >60.00)
Glucose, Bld: 97 mg/dL (ref 70–99)
Potassium: 4.4 mEq/L (ref 3.5–5.1)
Sodium: 140 mEq/L (ref 135–145)

## 2014-05-06 LAB — HEPATIC FUNCTION PANEL
ALT: 7 U/L (ref 0–53)
AST: 10 U/L (ref 0–37)
Albumin: 3.9 g/dL (ref 3.5–5.2)
Alkaline Phosphatase: 55 U/L (ref 39–117)
Bilirubin, Direct: 0 mg/dL (ref 0.0–0.3)
Total Bilirubin: 0.4 mg/dL (ref 0.2–1.2)
Total Protein: 7.5 g/dL (ref 6.0–8.3)

## 2014-05-06 LAB — LIPID PANEL
Cholesterol: 208 mg/dL — ABNORMAL HIGH (ref 0–200)
HDL: 36.2 mg/dL — ABNORMAL LOW (ref >39.00)
NonHDL: 171.8
Total CHOL/HDL Ratio: 6
Triglycerides: 231 mg/dL — ABNORMAL HIGH (ref 0.0–149.0)
VLDL: 46.2 mg/dL — ABNORMAL HIGH (ref 0.0–40.0)

## 2014-05-06 LAB — LDL CHOLESTEROL, DIRECT: Direct LDL: 130 mg/dL

## 2014-05-06 LAB — TSH: TSH: 2.01 u[IU]/mL (ref 0.35–4.50)

## 2014-05-06 NOTE — Telephone Encounter (Signed)
They told me he was taking 52m at night. If he was taking 532mbid then increase to 1026mid.   If he was taking 46m43md, then increase to 46mg48m.  thx

## 2014-05-06 NOTE — Patient Instructions (Signed)
Please continue all other medications as before, and refills have been done if requested.  Please have the pharmacy call with any other refills you may need.  Please continue your efforts at low cholesterol diet, and weight control.  You are otherwise up to date with prevention measures today.  Please keep your appointments with your specialists as you may have planned  Please go to the LAB in the Basement (turn left off the elevator) for the tests to be done today  You will be contacted by phone if any changes need to be made immediately.  Otherwise, you will receive a letter about your results with an explanation, but please check with MyChart first.  Please return in 6 months, or sooner if needed

## 2014-05-06 NOTE — Progress Notes (Signed)
Pre visit review using our clinic review tool, if applicable. No additional management support is needed unless otherwise documented below in the visit note. 

## 2014-05-06 NOTE — Telephone Encounter (Signed)
Mrs Grob called to question order for baclofen.  She says he was taking it bid.  She is saying he was on 10 mg though I can only see that he was written for 10 mg but was to take 1/2 tab always.  She is needing clarification.  I read her your note from visit yesterday but it didn't help.

## 2014-05-06 NOTE — Telephone Encounter (Signed)
notified

## 2014-05-06 NOTE — Progress Notes (Signed)
Subjective:    Patient ID: Austin [name], male    DOB: February 12, 1932, 81 y.o.   MRN: [id number]  HPI  Here to f/u; overall doing ok,  Pt denies chest pain, increasing sob or doe, wheezing, orthopnea, PND, increased LE swelling, palpitations, dizziness or syncope.  Pt denies new neurological symptoms such as new headache, or facial or extremity weakness or numbness.  Pt denies polydipsia, polyuria, or low sugar episode.   Pt denies new neurological symptoms such as new headache, or facial or extremity weakness or numbness.   Pt states overall good compliance with meds, mostly trying to follow appropriate diet, with wt overall stable,  but little exercise however. Extremely well cared for by wife and family.  Denies worsening depressive symptoms, suicidal ideation, or panic; No current complaints Past Medical History  Diagnosis Date  . ADENOCARCINOMA, PROSTATE, GLEASON GRADE 5 01/21/2009  . ALLERGIC RHINITIS 01/14/2007  . COLONIC POLYPS, HX OF 01/14/2007  . ELEVATED PROSTATE SPECIFIC ANTIGEN 03/27/2008  . ESOPHAGITIS 01/14/2007  . HYPERLIPIDEMIA 01/14/2007  . HYPERTENSION 01/14/2007  . LIBIDO, DECREASED 01/15/2007  . Hypertension   . Hypercholesterolemia    Past Surgical History  Procedure Laterality Date  . Hernia repair    . Prostate cryoablation    . Esophagogastroduodenoscopy N/A 08/15/2013    Procedure: ESOPHAGOGASTRODUODENOSCOPY (EGD);  Surgeon: Gwenyth Ober, MD;  [facility];  Service: General;  Laterality: N/A;  . Peg placement N/A 09/02/2013    Procedure: PERCUTANEOUS ENDOSCOPIC GASTROSTOMY (PEG) PLACEMENT;  Surgeon: Gwenyth Ober, MD;  Location: [facility];  Service: General;  Laterality: N/A;    reports that he has quit smoking. He does not have any smokeless tobacco history on file. He reports that he does not drink alcohol or use illicit drugs. family history includes Arthritis in an other family member; Hypertension in his other; Lung cancer in his father. No  Known Allergies Current Outpatient Prescriptions on File Prior to Visit  Medication Sig Dispense Refill  . baclofen (LIORESAL) 10 MG tablet Take 0.5 tablets (5 mg total) by mouth 3 (three) times daily. (Patient taking differently: Take 10 mg by mouth 2 (two) times daily. ) 60 each 2  . Control Gel Formula Dressing (DUODERM CGF DRESSING) MISC Use as directed 1 per day as needed 40 each 2  . lisinopril-hydrochlorothiazide (PRINZIDE,ZESTORETIC) 10-12.5 MG per tablet Take 1 tablet by mouth daily.    . Multiple Vitamin (MULTIVITAMIN) LIQD Place 5 mLs into feeding tube daily. (Patient taking differently: Take 5 mLs by mouth daily. ) 240 mL 1  . omeprazole (PRILOSEC) 40 MG capsule Open capsule and place administer medication via PEG. (Patient taking differently: Take 40 mg by mouth daily. ) 30 capsule 1  . simvastatin (ZOCOR) 40 MG tablet Take 40 mg by mouth daily. Take one half tablet by mouth at bedtime    . warfarin (COUMADIN) 5 MG tablet Take 1 1/2 tablets daily except 1 tablet on M,W,F 45 tablet 3  . tiZANidine (ZANAFLEX) 4 MG tablet Place 1 tablet (4 mg total) into feeding tube 4 (four) times daily -  with meals and at bedtime. (Patient not taking: Reported on 05/06/2014) 120 tablet 2   No current facility-administered medications on file prior to visit.   Review of Systems  Constitutional: Negative for unusual diaphoresis or night sweats HENT: Negative for ringing in ear or discharge Eyes: Negative for double vision or worsening visual disturbance.  Respiratory: Negative for choking and stridor.   Gastrointestinal: Negative for  vomiting or other signifcant bowel change Genitourinary: Negative for hematuria or change in urine volume.  Musculoskeletal: Negative for other MSK pain or swelling Skin: Negative for color change and worsening wound.  Neurological: Negative for tremors and numbness other than noted  Psychiatric/Behavioral: Negative for decreased concentration or agitation other than  above       Objective:   Physical Exam BP 128/80 mmHg  Pulse 81  Temp(Src) 98.5 F (36.9 C) (Oral)  Resp 18  Ht 5' 6"  (1.676 m)  Wt 178 lb (80.74 kg)  BMI 28.74 kg/m2  SpO2 98% VS noted,  Constitutional: Pt appears in no significant distress HENT: Head: NCAT.  Right Ear: External ear normal.  Left Ear: External ear normal.  Eyes: . Pupils are equal, round, and reactive to light. Conjunctivae and EOM are normal Neck: Normal range of motion. Neck supple.  Cardiovascular: Normal rate and regular rhythm.   Pulmonary/Chest: Effort normal and breath sounds without rales or wheezing.  Abd:  Soft, NT, ND, + BS Neurological: [name] alert. Not confused , motor grossly intact Skin: Skin is warm. No rash, no LE edema Psychiatric: Pt behavior is normal. No agitation.     Assessment & Plan:

## 2014-05-07 ENCOUNTER — Ambulatory Visit (HOSPITAL_COMMUNITY): Payer: Medicare Other

## 2014-05-07 ENCOUNTER — Encounter (HOSPITAL_COMMUNITY): Payer: Self-pay

## 2014-05-07 DIAGNOSIS — M436 Torticollis: Secondary | ICD-10-CM

## 2014-05-07 DIAGNOSIS — M6281 Muscle weakness (generalized): Secondary | ICD-10-CM | POA: Diagnosis not present

## 2014-05-07 DIAGNOSIS — S06890S Other specified intracranial injury without loss of consciousness, sequela: Secondary | ICD-10-CM | POA: Diagnosis not present

## 2014-05-07 DIAGNOSIS — R2689 Other abnormalities of gait and mobility: Secondary | ICD-10-CM

## 2014-05-07 DIAGNOSIS — G8114 Spastic hemiplegia affecting left nondominant side: Secondary | ICD-10-CM | POA: Diagnosis not present

## 2014-05-07 DIAGNOSIS — M5382 Other specified dorsopathies, cervical region: Secondary | ICD-10-CM

## 2014-05-07 DIAGNOSIS — R531 Weakness: Secondary | ICD-10-CM

## 2014-05-07 DIAGNOSIS — S069X0A Unspecified intracranial injury without loss of consciousness, initial encounter: Secondary | ICD-10-CM

## 2014-05-07 DIAGNOSIS — R269 Unspecified abnormalities of gait and mobility: Secondary | ICD-10-CM | POA: Diagnosis not present

## 2014-05-07 DIAGNOSIS — Z789 Other specified health status: Secondary | ICD-10-CM

## 2014-05-07 NOTE — Assessment & Plan Note (Signed)
stable overall by history and exam, recent data reviewed with pt, and pt to continue medical treatment as before,  to f/u any worsening symptoms or concerns Lab Results  Component Value Date   WBC 9.3 05/06/2014   HGB 12.2* 05/06/2014   HCT 35.6* 05/06/2014   PLT 231.0 05/06/2014   GLUCOSE 97 05/06/2014   CHOL 208* 05/06/2014   TRIG 231.0* 05/06/2014   HDL 36.20* 05/06/2014   LDLDIRECT 130.0 05/06/2014   LDLCALC 110* 04/03/2013   ALT 7 05/06/2014   AST 10 05/06/2014   NA 140 05/06/2014   K 4.4 05/06/2014   CL 105 05/06/2014   CREATININE 1.81* 05/06/2014   BUN 27* 05/06/2014   CO2 30 05/06/2014   TSH 2.01 05/06/2014   PSA 7.27* 04/14/2008   INR 2.2 04/13/2014   HGBA1C 6.0* 09/04/2013

## 2014-05-07 NOTE — Assessment & Plan Note (Addendum)
stable overall by history and exam, recent data reviewed with pt, and pt to continue medical treatment as before,  to f/u any worsening symptoms or concerns Lab Results  Component Value Date   LDLCALC 110* 04/03/2013   For lower chol diet, for lab today
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
## 2014-05-07 NOTE — Patient Instructions (Signed)
Bladder diary and bladder retraining information given. See scan in chart.

## 2014-05-07 NOTE — Assessment & Plan Note (Signed)
stable overall by history and exam, recent data reviewed with pt, and pt to continue medical treatment as before,  to f/u any worsening symptoms or concerns BP Readings from Last 3 Encounters:  05/06/14 128/80  05/05/14 137/60  01/06/14 137/54

## 2014-05-11 ENCOUNTER — Ambulatory Visit (INDEPENDENT_AMBULATORY_CARE_PROVIDER_SITE_OTHER): Payer: Medicare Other | Admitting: *Deleted

## 2014-05-11 DIAGNOSIS — I480 Paroxysmal atrial fibrillation: Secondary | ICD-10-CM | POA: Diagnosis not present

## 2014-05-11 DIAGNOSIS — Z7901 Long term (current) use of anticoagulants: Secondary | ICD-10-CM | POA: Diagnosis not present

## 2014-05-11 LAB — POCT INR: INR: 1.8

## 2014-05-12 ENCOUNTER — Encounter (HOSPITAL_COMMUNITY): Payer: Self-pay

## 2014-05-12 ENCOUNTER — Ambulatory Visit (HOSPITAL_COMMUNITY): Payer: Medicare Other

## 2014-05-12 ENCOUNTER — Ambulatory Visit (HOSPITAL_COMMUNITY): Payer: Medicare Other | Admitting: Physical Therapy

## 2014-05-12 DIAGNOSIS — M436 Torticollis: Secondary | ICD-10-CM

## 2014-05-12 DIAGNOSIS — R269 Unspecified abnormalities of gait and mobility: Secondary | ICD-10-CM | POA: Diagnosis not present

## 2014-05-12 DIAGNOSIS — S06890S Other specified intracranial injury without loss of consciousness, sequela: Secondary | ICD-10-CM | POA: Diagnosis not present

## 2014-05-12 DIAGNOSIS — Z789 Other specified health status: Secondary | ICD-10-CM

## 2014-05-12 DIAGNOSIS — M5382 Other specified dorsopathies, cervical region: Secondary | ICD-10-CM

## 2014-05-12 DIAGNOSIS — M6281 Muscle weakness (generalized): Secondary | ICD-10-CM | POA: Diagnosis not present

## 2014-05-12 DIAGNOSIS — G8114 Spastic hemiplegia affecting left nondominant side: Secondary | ICD-10-CM | POA: Diagnosis not present

## 2014-05-12 DIAGNOSIS — R531 Weakness: Secondary | ICD-10-CM

## 2014-05-12 DIAGNOSIS — R2689 Other abnormalities of gait and mobility: Secondary | ICD-10-CM
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
## 2014-05-12 NOTE — Addendum Note (Signed)
Addended by: Ailene Ravel D on: 05/12/2014 03:46 PM   Modules accepted: Orders
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
## 2014-05-14 ENCOUNTER — Ambulatory Visit (HOSPITAL_COMMUNITY): Payer: Medicare Other

## 2014-05-14 ENCOUNTER — Encounter (HOSPITAL_COMMUNITY): Payer: Self-pay

## 2014-05-14 DIAGNOSIS — M6281 Muscle weakness (generalized): Secondary | ICD-10-CM | POA: Diagnosis not present

## 2014-05-14 DIAGNOSIS — R531 Weakness: Secondary | ICD-10-CM

## 2014-05-14 DIAGNOSIS — R2689 Other abnormalities of gait and mobility: Secondary | ICD-10-CM

## 2014-05-14 DIAGNOSIS — R269 Unspecified abnormalities of gait and mobility: Secondary | ICD-10-CM | POA: Diagnosis not present

## 2014-05-14 DIAGNOSIS — S06890S Other specified intracranial injury without loss of consciousness, sequela: Secondary | ICD-10-CM | POA: Diagnosis not present

## 2014-05-14 DIAGNOSIS — Z789 Other specified health status: Secondary | ICD-10-CM

## 2014-05-14 DIAGNOSIS — G8114 Spastic hemiplegia affecting left nondominant side: Secondary | ICD-10-CM | POA: Diagnosis not present

## 2014-05-14 DIAGNOSIS — M5382 Other specified dorsopathies, cervical region: Secondary | ICD-10-CM

## 2014-05-14 DIAGNOSIS — M436 Torticollis: Secondary | ICD-10-CM
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
## 2014-05-18 ENCOUNTER — Telehealth: Payer: Self-pay | Admitting: Internal Medicine

## 2014-05-18 MED ORDER — OMEPRAZOLE 40 MG PO CPDR: 40.0000 mg | DELAYED_RELEASE_CAPSULE | Freq: Every day | ORAL | Status: DC

## 2014-05-18 NOTE — Telephone Encounter (Signed)
Pt wife called in and and said that pt needs refill on his omeprazole (PRILOSEC) 40 MG capsule [[id number]]    Please send to  [facility] that pt has on file in chart

## 2014-05-19 ENCOUNTER — Encounter (HOSPITAL_COMMUNITY): Payer: Self-pay | Admitting: Occupational Therapy

## 2014-05-19 ENCOUNTER — Ambulatory Visit (HOSPITAL_COMMUNITY): Payer: Medicare Other | Admitting: Physical Therapy

## 2014-05-19 ENCOUNTER — Ambulatory Visit (HOSPITAL_COMMUNITY): Payer: Medicare Other | Admitting: Occupational Therapy

## 2014-05-19 DIAGNOSIS — G8114 Spastic hemiplegia affecting left nondominant side: Secondary | ICD-10-CM | POA: Diagnosis not present

## 2014-05-19 DIAGNOSIS — S0193XS Puncture wound without foreign body of unspecified part of head, sequela: Secondary | ICD-10-CM

## 2014-05-19 DIAGNOSIS — W3400XS Accidental discharge from unspecified firearms or gun, sequela: Secondary | ICD-10-CM

## 2014-05-19 DIAGNOSIS — M436 Torticollis: Secondary | ICD-10-CM

## 2014-05-19 DIAGNOSIS — R29898 Other symptoms and signs involving the musculoskeletal system: Secondary | ICD-10-CM

## 2014-05-19 DIAGNOSIS — G8110 Spastic hemiplegia affecting unspecified side: Secondary | ICD-10-CM

## 2014-05-19 DIAGNOSIS — M6281 Muscle weakness (generalized): Secondary | ICD-10-CM | POA: Diagnosis not present

## 2014-05-19 DIAGNOSIS — S069X0A Unspecified intracranial injury without loss of consciousness, initial encounter: Secondary | ICD-10-CM

## 2014-05-19 DIAGNOSIS — Z789 Other specified health status: Secondary | ICD-10-CM

## 2014-05-19 DIAGNOSIS — R269 Unspecified abnormalities of gait and mobility: Secondary | ICD-10-CM | POA: Diagnosis not present

## 2014-05-19 DIAGNOSIS — R531 Weakness: Secondary | ICD-10-CM

## 2014-05-19 DIAGNOSIS — M5382 Other specified dorsopathies, cervical region: Secondary | ICD-10-CM

## 2014-05-19 DIAGNOSIS — R2689 Other abnormalities of gait and mobility: Secondary | ICD-10-CM

## 2014-05-19 DIAGNOSIS — S06890S Other specified intracranial injury without loss of consciousness, sequela: Secondary | ICD-10-CM | POA: Diagnosis not present
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
## 2014-05-21 ENCOUNTER — Ambulatory Visit (HOSPITAL_COMMUNITY): Payer: Medicare Other

## 2014-05-21 ENCOUNTER — Encounter (HOSPITAL_COMMUNITY): Payer: Self-pay

## 2014-05-21 DIAGNOSIS — Z789 Other specified health status: Secondary | ICD-10-CM

## 2014-05-21 DIAGNOSIS — M6281 Muscle weakness (generalized): Secondary | ICD-10-CM | POA: Diagnosis not present

## 2014-05-21 DIAGNOSIS — M5382 Other specified dorsopathies, cervical region: Secondary | ICD-10-CM

## 2014-05-21 DIAGNOSIS — M436 Torticollis: Secondary | ICD-10-CM

## 2014-05-21 DIAGNOSIS — R531 Weakness: Secondary | ICD-10-CM

## 2014-05-21 DIAGNOSIS — R269 Unspecified abnormalities of gait and mobility: Secondary | ICD-10-CM | POA: Diagnosis not present

## 2014-05-21 DIAGNOSIS — G8114 Spastic hemiplegia affecting left nondominant side: Secondary | ICD-10-CM | POA: Diagnosis not present

## 2014-05-21 DIAGNOSIS — R2689 Other abnormalities of gait and mobility: Secondary | ICD-10-CM

## 2014-05-21 DIAGNOSIS — S06890S Other specified intracranial injury without loss of consciousness, sequela: Secondary | ICD-10-CM | POA: Diagnosis not present
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
## 2014-05-26 ENCOUNTER — Ambulatory Visit (HOSPITAL_COMMUNITY): Payer: Medicare Other | Attending: Physical Medicine & Rehabilitation

## 2014-05-26 ENCOUNTER — Encounter (HOSPITAL_COMMUNITY): Payer: Self-pay

## 2014-05-26 ENCOUNTER — Ambulatory Visit (HOSPITAL_COMMUNITY): Payer: Medicare Other | Admitting: Physical Therapy

## 2014-05-26 DIAGNOSIS — M436 Torticollis: Secondary | ICD-10-CM

## 2014-05-26 DIAGNOSIS — S06890S Other specified intracranial injury without loss of consciousness, sequela: Secondary | ICD-10-CM | POA: Insufficient documentation

## 2014-05-26 DIAGNOSIS — M5382 Other specified dorsopathies, cervical region: Secondary | ICD-10-CM

## 2014-05-26 DIAGNOSIS — G8114 Spastic hemiplegia affecting left nondominant side: Secondary | ICD-10-CM | POA: Insufficient documentation

## 2014-05-26 DIAGNOSIS — Z789 Other specified health status: Secondary | ICD-10-CM

## 2014-05-26 DIAGNOSIS — R2689 Other abnormalities of gait and mobility: Secondary | ICD-10-CM

## 2014-05-26 DIAGNOSIS — M6281 Muscle weakness (generalized): Secondary | ICD-10-CM | POA: Diagnosis not present

## 2014-05-26 DIAGNOSIS — R531 Weakness: Secondary | ICD-10-CM

## 2014-05-26 DIAGNOSIS — R269 Unspecified abnormalities of gait and mobility: Secondary | ICD-10-CM | POA: Diagnosis not present
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
## 2014-05-28 ENCOUNTER — Ambulatory Visit (HOSPITAL_COMMUNITY): Payer: Medicare Other | Admitting: Occupational Therapy

## 2014-05-28 ENCOUNTER — Ambulatory Visit (HOSPITAL_COMMUNITY): Payer: Medicare Other | Admitting: Physical Therapy

## 2014-05-28 ENCOUNTER — Encounter (HOSPITAL_COMMUNITY): Payer: Self-pay | Admitting: Occupational Therapy

## 2014-05-28 DIAGNOSIS — S06890S Other specified intracranial injury without loss of consciousness, sequela: Secondary | ICD-10-CM | POA: Diagnosis not present

## 2014-05-28 DIAGNOSIS — S069X0A Unspecified intracranial injury without loss of consciousness, initial encounter: Secondary | ICD-10-CM

## 2014-05-28 DIAGNOSIS — R531 Weakness: Secondary | ICD-10-CM

## 2014-05-28 DIAGNOSIS — Z789 Other specified health status: Secondary | ICD-10-CM

## 2014-05-28 DIAGNOSIS — R2689 Other abnormalities of gait and mobility: Secondary | ICD-10-CM

## 2014-05-28 DIAGNOSIS — R269 Unspecified abnormalities of gait and mobility: Secondary | ICD-10-CM | POA: Diagnosis not present

## 2014-05-28 DIAGNOSIS — M436 Torticollis: Secondary | ICD-10-CM

## 2014-05-28 DIAGNOSIS — M6281 Muscle weakness (generalized): Secondary | ICD-10-CM | POA: Diagnosis not present

## 2014-05-28 DIAGNOSIS — G8114 Spastic hemiplegia affecting left nondominant side: Secondary | ICD-10-CM | POA: Diagnosis not present

## 2014-05-28 DIAGNOSIS — R29898 Other symptoms and signs involving the musculoskeletal system: Secondary | ICD-10-CM
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
## 2014-05-28 NOTE — Patient Instructions (Signed)
   Copyright  VHI. All rights reserved.  Getting Into / Out of Bed   Lower self to lie down on one side by raising legs and lowering head at the same time. Use arms to assist moving without twisting. Bend both knees to roll onto back if desired. To sit up, start from lying on side, and use same move-ments in reverse. Keep trunk aligned with legs.   Copyright  VHI. All rights reserved.  Log Roll   Lying on back, bend left knee and place left arm across chest. Roll all in one movement to the right. Reverse to roll to the left. Always move as one unit.   Copyright  VHI. All rights reserved.
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
## 2014-06-01 ENCOUNTER — Ambulatory Visit (INDEPENDENT_AMBULATORY_CARE_PROVIDER_SITE_OTHER): Payer: Medicare Other | Admitting: *Deleted

## 2014-06-01 ENCOUNTER — Telehealth: Payer: Self-pay | Admitting: *Deleted

## 2014-06-01 DIAGNOSIS — Z7901 Long term (current) use of anticoagulants: Secondary | ICD-10-CM

## 2014-06-01 DIAGNOSIS — I480 Paroxysmal atrial fibrillation: Secondary | ICD-10-CM

## 2014-06-01 LAB — POCT INR: INR: 2.2

## 2014-06-01 NOTE — Telephone Encounter (Signed)
[name] called because I think the therapy is finished and they are saying that he is not making enough progress for insurance to deem necessary.  She feel it is helping him and is asking if there is anything more Dr Naaman Plummer can do.  They come in for appt 06/16/14.

## 2014-06-02 ENCOUNTER — Ambulatory Visit (HOSPITAL_COMMUNITY): Payer: Medicare Other

## 2014-06-02 ENCOUNTER — Encounter (HOSPITAL_COMMUNITY): Payer: Self-pay

## 2014-06-02 ENCOUNTER — Ambulatory Visit (HOSPITAL_COMMUNITY): Payer: Medicare Other | Admitting: Physical Therapy

## 2014-06-02 DIAGNOSIS — Z789 Other specified health status: Secondary | ICD-10-CM

## 2014-06-02 DIAGNOSIS — M5382 Other specified dorsopathies, cervical region: Secondary | ICD-10-CM

## 2014-06-02 DIAGNOSIS — R269 Unspecified abnormalities of gait and mobility: Secondary | ICD-10-CM | POA: Diagnosis not present

## 2014-06-02 DIAGNOSIS — M6281 Muscle weakness (generalized): Secondary | ICD-10-CM | POA: Diagnosis not present

## 2014-06-02 DIAGNOSIS — S06890S Other specified intracranial injury without loss of consciousness, sequela: Secondary | ICD-10-CM | POA: Diagnosis not present

## 2014-06-02 DIAGNOSIS — R2689 Other abnormalities of gait and mobility: Secondary | ICD-10-CM

## 2014-06-02 DIAGNOSIS — M436 Torticollis: Secondary | ICD-10-CM

## 2014-06-02 DIAGNOSIS — G8114 Spastic hemiplegia affecting left nondominant side: Secondary | ICD-10-CM | POA: Diagnosis not present

## 2014-06-02 NOTE — Telephone Encounter (Addendum)
I called Austin [name] back and relayed the information from Dr Naaman Plummer but he is not actually discharged from therapy and we have had no notification from the therapist that there is a problem so I am not sure what we can do.  I suggested that it be discussed at his upcoming appointment and Dr Naaman Plummer can assess the situation more accurately.

## 2014-06-02 NOTE — Telephone Encounter (Signed)
They can appeal to insurance.  From there we can find out what insurance needs to authorize further visits.
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
## 2014-06-04 ENCOUNTER — Ambulatory Visit (HOSPITAL_COMMUNITY): Payer: Medicare Other | Admitting: Occupational Therapy

## 2014-06-04 ENCOUNTER — Encounter (HOSPITAL_COMMUNITY): Payer: Self-pay | Admitting: Occupational Therapy

## 2014-06-04 DIAGNOSIS — R531 Weakness: Secondary | ICD-10-CM

## 2014-06-04 DIAGNOSIS — S06890S Other specified intracranial injury without loss of consciousness, sequela: Secondary | ICD-10-CM | POA: Diagnosis not present

## 2014-06-04 DIAGNOSIS — G8114 Spastic hemiplegia affecting left nondominant side: Secondary | ICD-10-CM | POA: Diagnosis not present

## 2014-06-04 DIAGNOSIS — Z789 Other specified health status: Secondary | ICD-10-CM

## 2014-06-04 DIAGNOSIS — M6281 Muscle weakness (generalized): Secondary | ICD-10-CM | POA: Diagnosis not present

## 2014-06-04 DIAGNOSIS — R269 Unspecified abnormalities of gait and mobility: Secondary | ICD-10-CM | POA: Diagnosis not present
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
## 2014-06-09 ENCOUNTER — Ambulatory Visit (HOSPITAL_COMMUNITY): Payer: Medicare Other | Admitting: Occupational Therapy

## 2014-06-09 ENCOUNTER — Ambulatory Visit (HOSPITAL_COMMUNITY): Payer: Medicare Other

## 2014-06-09 ENCOUNTER — Encounter (HOSPITAL_COMMUNITY): Payer: Self-pay | Admitting: Occupational Therapy

## 2014-06-09 DIAGNOSIS — R2689 Other abnormalities of gait and mobility: Secondary | ICD-10-CM

## 2014-06-09 DIAGNOSIS — S06890S Other specified intracranial injury without loss of consciousness, sequela: Secondary | ICD-10-CM | POA: Diagnosis not present

## 2014-06-09 DIAGNOSIS — R531 Weakness: Secondary | ICD-10-CM

## 2014-06-09 DIAGNOSIS — M6281 Muscle weakness (generalized): Secondary | ICD-10-CM | POA: Diagnosis not present

## 2014-06-09 DIAGNOSIS — Z789 Other specified health status: Secondary | ICD-10-CM

## 2014-06-09 DIAGNOSIS — R269 Unspecified abnormalities of gait and mobility: Secondary | ICD-10-CM | POA: Diagnosis not present

## 2014-06-09 DIAGNOSIS — G8114 Spastic hemiplegia affecting left nondominant side: Secondary | ICD-10-CM | POA: Diagnosis not present

## 2014-06-09 DIAGNOSIS — M436 Torticollis: Secondary | ICD-10-CM

## 2014-06-09 DIAGNOSIS — R29898 Other symptoms and signs involving the musculoskeletal system: Secondary | ICD-10-CM

## 2014-06-09 DIAGNOSIS — M5382 Other specified dorsopathies, cervical region: Secondary | ICD-10-CM
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
## 2014-06-11 ENCOUNTER — Encounter (HOSPITAL_COMMUNITY): Payer: Medicare Other | Admitting: Occupational Therapy

## 2014-06-11 ENCOUNTER — Ambulatory Visit (HOSPITAL_COMMUNITY): Payer: Medicare Other | Admitting: Physical Therapy

## 2014-06-11 DIAGNOSIS — M5382 Other specified dorsopathies, cervical region: Secondary | ICD-10-CM

## 2014-06-11 DIAGNOSIS — W3400XS Accidental discharge from unspecified firearms or gun, sequela: Secondary | ICD-10-CM

## 2014-06-11 DIAGNOSIS — M6281 Muscle weakness (generalized): Secondary | ICD-10-CM | POA: Diagnosis not present

## 2014-06-11 DIAGNOSIS — S0193XS Puncture wound without foreign body of unspecified part of head, sequela: Secondary | ICD-10-CM

## 2014-06-11 DIAGNOSIS — S06890S Other specified intracranial injury without loss of consciousness, sequela: Secondary | ICD-10-CM | POA: Diagnosis not present

## 2014-06-11 DIAGNOSIS — G8114 Spastic hemiplegia affecting left nondominant side: Secondary | ICD-10-CM | POA: Diagnosis not present

## 2014-06-11 DIAGNOSIS — R269 Unspecified abnormalities of gait and mobility: Secondary | ICD-10-CM | POA: Diagnosis not present

## 2014-06-11 DIAGNOSIS — R2689 Other abnormalities of gait and mobility: Secondary | ICD-10-CM

## 2014-06-11 DIAGNOSIS — M436 Torticollis: Secondary | ICD-10-CM
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
## 2014-06-16 ENCOUNTER — Encounter: Payer: Medicare Other | Attending: Physical Medicine & Rehabilitation | Admitting: Physical Medicine & Rehabilitation

## 2014-06-16 ENCOUNTER — Encounter (HOSPITAL_COMMUNITY): Payer: Medicare Other | Admitting: Occupational Therapy

## 2014-06-16 ENCOUNTER — Encounter: Payer: Self-pay | Admitting: Physical Medicine & Rehabilitation

## 2014-06-16 ENCOUNTER — Ambulatory Visit (HOSPITAL_COMMUNITY): Payer: Medicare Other

## 2014-06-16 VITALS — BP 134/58 | HR 76 | Resp 14

## 2014-06-16 DIAGNOSIS — R531 Weakness: Secondary | ICD-10-CM | POA: Diagnosis not present

## 2014-06-16 DIAGNOSIS — M7502 Adhesive capsulitis of left shoulder: Secondary | ICD-10-CM | POA: Diagnosis not present

## 2014-06-16 DIAGNOSIS — G8114 Spastic hemiplegia affecting left nondominant side: Secondary | ICD-10-CM

## 2014-06-16 DIAGNOSIS — G8110 Spastic hemiplegia affecting unspecified side: Secondary | ICD-10-CM

## 2014-06-16 DIAGNOSIS — Z789 Other specified health status: Secondary | ICD-10-CM | POA: Diagnosis not present

## 2014-06-16 DIAGNOSIS — R29898 Other symptoms and signs involving the musculoskeletal system: Secondary | ICD-10-CM | POA: Insufficient documentation

## 2014-06-16 DIAGNOSIS — M5382 Other specified dorsopathies, cervical region: Secondary | ICD-10-CM | POA: Diagnosis not present

## 2014-06-16 DIAGNOSIS — M436 Torticollis: Secondary | ICD-10-CM | POA: Insufficient documentation

## 2014-06-16 DIAGNOSIS — R2689 Other abnormalities of gait and mobility: Secondary | ICD-10-CM | POA: Diagnosis not present

## 2014-06-16 NOTE — Progress Notes (Signed)
Subjective:    Patient ID: Austin [name], male    DOB: [birth year], 81 y.o.   MRN: [id number]  HPI  Pain Inventory Average Pain 3 Pain Right Now 0 My pain is intermittent, stabbing and aching  In the last 24 hours, has pain interfered with the following? General activity 1 Relation with others 0 Enjoyment of life 2 What TIME of day is your pain at its worst? night Sleep (in general) Good  Pain is worse with: some activites Pain improves with: therapy/exercise and medication Relief from Meds: 7  Mobility walk with assistance use a walker how many minutes can you walk? 1-2 ability to climb steps?  no do you drive?  no use a wheelchair  Function retired  Neuro/Psych weakness trouble walking  Prior Studies Any changes since last visit?  no  Physicians involved in your care Any changes since last visit?  no   Family History  Problem Relation Age of Onset  . Lung cancer Father   . Hypertension Other   . Arthritis     History   Social History  . Marital Status: Married    Spouse Name: N/A  . Number of Children: 3  . Years of Education: 16   Occupational History  . Mortician    Social History Main Topics  . Smoking status: Former Research scientist (life sciences)  . Smokeless tobacco: Not on file  . Alcohol Use: No  . Drug Use: No  . Sexual Activity: Not on file   Other Topics Concern  . 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 Working full time. Discussion needed in regard to Advance Care Planning-DNR/DNI, no artificial feeding or hydration, No HD, no heroic or futile.                Past Surgical History  Procedure Laterality Date  . Hernia repair    . Prostate cryoablation    . Esophagogastroduodenoscopy N/A 08/15/2013    Procedure: ESOPHAGOGASTRODUODENOSCOPY (EGD);  Surgeon: Gwenyth Ober, MD;  Location: [facility] ENDOSCOPY;   Service: General;  Laterality: N/A;  . Peg placement N/A 09/02/2013    Procedure: PERCUTANEOUS ENDOSCOPIC GASTROSTOMY (PEG) PLACEMENT;  Surgeon: Gwenyth Ober, MD;  Location: [facility] ENDOSCOPY;  Service: General;  Laterality: N/A;   Past Medical History  Diagnosis Date  . ADENOCARCINOMA, PROSTATE, GLEASON GRADE 5 01/21/2009  . ALLERGIC RHINITIS 01/14/2007  . COLONIC POLYPS, HX OF 01/14/2007  . ELEVATED PROSTATE SPECIFIC ANTIGEN 03/27/2008  . ESOPHAGITIS 01/14/2007  . HYPERLIPIDEMIA 01/14/2007  . HYPERTENSION 01/14/2007  . LIBIDO, DECREASED 01/15/2007  . Hypertension   . Hypercholesterolemia    BP 134/58 mmHg  Pulse 76  Resp 14  SpO2 97%  Opioid Risk Score:   Fall Risk Score: Moderate Fall Risk (6-13 points)`1  Depression screen PHQ 2/9  Depression screen PHQ 2/9 05/05/2014  Decreased Interest 0  Down, Depressed, Hopeless 0  PHQ - 2 Score 0  Altered sleeping 0  Tired, decreased energy 0  Change in appetite 0  Feeling bad or failure about yourself  0  Trouble concentrating 0  Moving slowly or fidgety/restless 0  Suicidal thoughts 0  PHQ-9 Score 0     Review of Systems  HENT: Negative.   Eyes: Negative.   Respiratory: Negative.   Cardiovascular: Positive for leg swelling.  Gastrointestinal: Negative.  Endocrine: Negative.   Genitourinary: Negative.   Musculoskeletal: Positive for back pain.  Skin: Negative.   Allergic/Immunologic: Negative.   Neurological: Positive for weakness.       Trouble walking  Hematological: Negative.   Psychiatric/Behavioral: Negative.        Objective:   Physical Exam        Assessment & Plan:  Botox Injection for spasticity using needle EMG guidance Indication: spastic left hemiparesis, torticollis, Left upper ext, trunk, neck  Dilution: 100 Units/ml        Total Units Injected: 500 Indication: Severe spasticity which interferes with ADL,mobility and/or  hygiene and is unresponsive to medication management and other  conservative care Informed consent was obtained after describing risks and benefits of the procedure with the patient. This includes bleeding, bruising, infection, excessive weakness, or medication side effects. A REMS form is on file and signed.  Needle: 68m injectable monopolar needle electrode  Number of units per muscle Right SCM: 75 u Left SCM: 25 u Pectoralis Major 150 units Pectoralis Minor 50 units Biceps 0 units Brachioradialis 0 units FCR 0 units FCU 0 units FDS 75 units FDP 75 units FPL 0 units Pronator Teres 50 units Pronator Quadratus 0 units Quadriceps 0 units Gastroc/soleus 0 units Hamstrings 0 units Tibialis Posterior 0 units EHL 0 units All injections were done after obtaining appropriate EMG activity and after negative drawback for blood. The patient tolerated the procedure well. Post procedure instructions were given. A followup appointment was made.     Left shoulder injection:  After informed consent and preparation of the skin with betadine and isopropyl alcohol, I injected 652m(1cc) of celestone and 4cc of 1% lidocaine into the left subacromial space via lateral approach. Additionally, aspiration was performed prior to injection. The patient tolerated well, and no complications were encountered. Afterward the area was cleaned and dressed. Post- injection instructions were provided.

## 2014-06-16 NOTE — Patient Instructions (Signed)
PLEASE CALL ME WITH ANY PROBLEMS OR QUESTIONS (#[phone]).      

## 2014-06-17 ENCOUNTER — Ambulatory Visit (HOSPITAL_COMMUNITY): Payer: Medicare Other

## 2014-06-17 ENCOUNTER — Encounter (HOSPITAL_COMMUNITY): Payer: Medicare Other | Admitting: Specialist

## 2014-06-17 DIAGNOSIS — R531 Weakness: Secondary | ICD-10-CM

## 2014-06-17 DIAGNOSIS — M5382 Other specified dorsopathies, cervical region: Secondary | ICD-10-CM

## 2014-06-17 DIAGNOSIS — M436 Torticollis: Secondary | ICD-10-CM

## 2014-06-17 DIAGNOSIS — R2689 Other abnormalities of gait and mobility: Secondary | ICD-10-CM

## 2014-06-17 DIAGNOSIS — S06890S Other specified intracranial injury without loss of consciousness, sequela: Secondary | ICD-10-CM | POA: Diagnosis not present

## 2014-06-17 DIAGNOSIS — R269 Unspecified abnormalities of gait and mobility: Secondary | ICD-10-CM | POA: Diagnosis not present

## 2014-06-17 DIAGNOSIS — M6281 Muscle weakness (generalized): Secondary | ICD-10-CM | POA: Diagnosis not present

## 2014-06-17 DIAGNOSIS — G8114 Spastic hemiplegia affecting left nondominant side: Secondary | ICD-10-CM | POA: Diagnosis not present
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
## 2014-06-18 ENCOUNTER — Ambulatory Visit (HOSPITAL_COMMUNITY): Payer: Medicare Other | Admitting: Physical Therapy

## 2014-06-18 ENCOUNTER — Encounter (HOSPITAL_COMMUNITY): Payer: Medicare Other

## 2014-06-19 ENCOUNTER — Encounter (HOSPITAL_COMMUNITY): Payer: Medicare Other | Admitting: Occupational Therapy

## 2014-06-19 ENCOUNTER — Ambulatory Visit (HOSPITAL_COMMUNITY): Payer: Medicare Other | Admitting: Physical Therapy

## 2014-06-19 DIAGNOSIS — M5382 Other specified dorsopathies, cervical region: Secondary | ICD-10-CM

## 2014-06-19 DIAGNOSIS — G8114 Spastic hemiplegia affecting left nondominant side: Secondary | ICD-10-CM | POA: Diagnosis not present

## 2014-06-19 DIAGNOSIS — R2689 Other abnormalities of gait and mobility: Secondary | ICD-10-CM

## 2014-06-19 DIAGNOSIS — S06890S Other specified intracranial injury without loss of consciousness, sequela: Secondary | ICD-10-CM | POA: Diagnosis not present

## 2014-06-19 DIAGNOSIS — M6281 Muscle weakness (generalized): Secondary | ICD-10-CM | POA: Diagnosis not present

## 2014-06-19 DIAGNOSIS — R269 Unspecified abnormalities of gait and mobility: Secondary | ICD-10-CM | POA: Diagnosis not present

## 2014-06-19 DIAGNOSIS — M436 Torticollis: Secondary | ICD-10-CM
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
## 2014-06-21 ENCOUNTER — Encounter (HOSPITAL_COMMUNITY): Payer: Self-pay | Admitting: Emergency Medicine

## 2014-06-21 ENCOUNTER — Emergency Department (HOSPITAL_COMMUNITY)
Admission: EM | Admit: 2014-06-21 | Discharge: 2014-06-22 | Disposition: A | Discharge status: Home / Self Care | Payer: Medicare Other | Attending: Emergency Medicine | Admitting: Emergency Medicine

## 2014-06-21 ENCOUNTER — Emergency Department (HOSPITAL_COMMUNITY): Payer: Medicare Other

## 2014-06-21 DIAGNOSIS — I10 Essential (primary) hypertension: Secondary | ICD-10-CM | POA: Insufficient documentation

## 2014-06-21 DIAGNOSIS — Z7901 Long term (current) use of anticoagulants: Secondary | ICD-10-CM | POA: Insufficient documentation

## 2014-06-21 DIAGNOSIS — R591 Generalized enlarged lymph nodes: Secondary | ICD-10-CM

## 2014-06-21 DIAGNOSIS — L03221 Cellulitis of neck: Secondary | ICD-10-CM | POA: Diagnosis not present

## 2014-06-21 DIAGNOSIS — R220 Localized swelling, mass and lump, head: Secondary | ICD-10-CM | POA: Diagnosis present

## 2014-06-21 DIAGNOSIS — Z86010 Personal history of colonic polyps: Secondary | ICD-10-CM | POA: Diagnosis not present

## 2014-06-21 DIAGNOSIS — Z8719 Personal history of other diseases of the digestive system: Secondary | ICD-10-CM | POA: Insufficient documentation

## 2014-06-21 DIAGNOSIS — R221 Localized swelling, mass and lump, neck: Secondary | ICD-10-CM

## 2014-06-21 DIAGNOSIS — Z87891 Personal history of nicotine dependence: Secondary | ICD-10-CM | POA: Insufficient documentation

## 2014-06-21 DIAGNOSIS — E780 Pure hypercholesterolemia: Secondary | ICD-10-CM | POA: Diagnosis not present

## 2014-06-21 DIAGNOSIS — Z79899 Other long term (current) drug therapy: Secondary | ICD-10-CM | POA: Insufficient documentation

## 2014-06-21 DIAGNOSIS — R590 Localized enlarged lymph nodes: Secondary | ICD-10-CM | POA: Diagnosis not present

## 2014-06-21 DIAGNOSIS — J029 Acute pharyngitis, unspecified: Secondary | ICD-10-CM | POA: Insufficient documentation

## 2014-06-21 DIAGNOSIS — Z8546 Personal history of malignant neoplasm of prostate: Secondary | ICD-10-CM | POA: Diagnosis not present

## 2014-06-21 DIAGNOSIS — E785 Hyperlipidemia, unspecified: Secondary | ICD-10-CM | POA: Insufficient documentation

## 2014-06-21 LAB — CBC WITH DIFFERENTIAL/PLATELET
Basophils Absolute: 0 10*3/uL (ref 0.0–0.1)
Basophils Relative: 0 % (ref 0–1)
Eosinophils Absolute: 0.2 10*3/uL (ref 0.0–0.7)
Eosinophils Relative: 2 % (ref 0–5)
HCT: 37.6 % — ABNORMAL LOW (ref 39.0–52.0)
Hemoglobin: 12.3 g/dL — ABNORMAL LOW (ref 13.0–17.0)
Lymphocytes Relative: 19 % (ref 12–46)
Lymphs Abs: 2 10*3/uL (ref 0.7–4.0)
MCH: 30.6 pg (ref 26.0–34.0)
MCHC: 32.7 g/dL (ref 30.0–36.0)
MCV: 93.5 fL (ref 78.0–100.0)
Monocytes Absolute: 1 10*3/uL (ref 0.1–1.0)
Monocytes Relative: 10 % (ref 3–12)
Neutro Abs: 7.2 10*3/uL (ref 1.7–7.7)
Neutrophils Relative %: 69 % (ref 43–77)
Platelets: 206 10*3/uL (ref 150–400)
RBC: 4.02 MIL/uL — ABNORMAL LOW (ref 4.22–5.81)
RDW: 13.9 % (ref 11.5–15.5)
WBC: 10.4 10*3/uL (ref 4.0–10.5)

## 2014-06-21 LAB — BASIC METABOLIC PANEL
Anion gap: 9 (ref 5–15)
BUN: 24 mg/dL — ABNORMAL HIGH (ref 6–20)
CO2: 26 mmol/L (ref 22–32)
Calcium: 9 mg/dL (ref 8.9–10.3)
Chloride: 103 mmol/L (ref 101–111)
Creatinine, Ser: 1.61 mg/dL — ABNORMAL HIGH (ref 0.61–1.24)
GFR calc Af Amer: 45 mL/min — ABNORMAL LOW (ref >60)
GFR calc non Af Amer: 39 mL/min — ABNORMAL LOW (ref >60)
Glucose, Bld: 134 mg/dL — ABNORMAL HIGH (ref 65–99)
Potassium: 3.9 mmol/L (ref 3.5–5.1)
Sodium: 138 mmol/L (ref 135–145)

## 2014-06-21 LAB — PROTIME-INR
INR: 2.37 — ABNORMAL HIGH (ref 0.00–1.49)
Prothrombin Time: 25.7 seconds — ABNORMAL HIGH (ref 11.6–15.2)

## 2014-06-21 MED ORDER — SODIUM CHLORIDE 0.9 % IV SOLN
1.5000 g | Freq: Once | INTRAVENOUS | Status: AC
  Administered 2014-06-22: 1.5 g via INTRAVENOUS
  Filled 2014-06-21: qty 1.5

## 2014-06-21 MED ORDER — DEXAMETHASONE SODIUM PHOSPHATE 10 MG/ML IJ SOLN
10.0000 mg | Freq: Once | INTRAMUSCULAR | Status: AC
  Administered 2014-06-21: 10 mg via INTRAVENOUS
  Filled 2014-06-21: qty 1

## 2014-06-21 MED ORDER — IOHEXOL 300 MG/ML  SOLN
65.0000 mL | Freq: Once | INTRAMUSCULAR | Status: AC | PRN
  Administered 2014-06-21: 65 mL via INTRAVENOUS
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
## 2014-06-21 NOTE — ED Notes (Signed)
C/o swelling to L side of face- just in front of ear since this morning.  States he has taken Benadryl several times today.  Area seems to get larger after eating.  Denies sob.  States throat just started feeling a little sore.

## 2014-06-22 DIAGNOSIS — L03221 Cellulitis of neck: Secondary | ICD-10-CM | POA: Diagnosis not present

## 2014-06-22 MED ORDER — AMOXICILLIN-POT CLAVULANATE 875-125 MG PO TABS: 1.0000 | ORAL_TABLET | Freq: Two times a day (BID) | ORAL | Status: DC

## 2014-06-22 NOTE — Discharge Instructions (Signed)
Cellulitis Cellulitis is an infection of the skin and the tissue beneath it. The infected area is usually red and tender. Cellulitis occurs most often in the arms and lower legs.  CAUSES  Cellulitis is caused by bacteria that enter the skin through cracks or cuts in the skin. The most common types of bacteria that cause cellulitis are staphylococci and streptococci. SIGNS AND SYMPTOMS   Redness and warmth.  Swelling.  Tenderness or pain.  Fever. DIAGNOSIS  Your health care provider can usually determine what is wrong based on a physical exam. Blood tests may also be done. TREATMENT  Treatment usually involves taking an antibiotic medicine. HOME CARE INSTRUCTIONS   Take your antibiotic medicine as directed by your health care provider. Finish the antibiotic even if you start to feel better.  Keep the infected arm or leg elevated to reduce swelling.  Apply a warm cloth to the affected area up to 4 times per day to relieve pain.  Take medicines only as directed by your health care provider.  Keep all follow-up visits as directed by your health care provider. SEEK MEDICAL CARE IF:   You notice red streaks coming from the infected area.  Your red area gets larger or turns dark in color.  Your bone or joint underneath the infected area becomes painful after the skin has healed.  Your infection returns in the same area or another area.  You notice a swollen bump in the infected area.  You develop new symptoms.  You have a fever. SEEK IMMEDIATE MEDICAL CARE IF:   You feel very sleepy.  You develop vomiting or diarrhea.  You have a general ill feeling (malaise) with muscle aches and pains. MAKE SURE YOU:   Understand these instructions.  Will watch your condition.  Will get help right away if you are not doing well or get worse. Document Released: 10/19/2004 Document Revised: 05/26/2013 Document Reviewed: 03/27/2011 [facility] Patient Information 2015 [place], [place].  This information is not intended to replace advice given to you by your health care provider. Make sure you discuss any questions you have with your health care provider.  Swollen Lymph Nodes The lymphatic system filters fluid from around cells. It is like a system of blood vessels. These channels carry lymph instead of blood. The lymphatic system is an important part of the immune (disease fighting) system. When people talk about "swollen glands in the neck," they are usually talking about swollen lymph nodes. The lymph nodes are like the little traps for infection. You and your caregiver may be able to feel lymph nodes, especially swollen nodes, in these common areas: the groin (inguinal area), armpits (axilla), and above the clavicle (supraclavicular). You may also feel them in the neck (cervical) and the back of the head just above the hairline (occipital). Swollen glands occur when there is any condition in which the body responds with an allergic type of reaction. For instance, the glands in the neck can become swollen from insect bites or any type of minor infection on the head. These are very noticeable in children with only minor problems. Lymph nodes may also become swollen when there is a tumor or problem with the lymphatic system, such as Hodgkin's disease. TREATMENT   Most swollen glands do not require treatment. They can be observed (watched) for a short period of time, if your caregiver feels it is necessary. Most of the time, observation is not necessary.  Antibiotics (medicines that kill germs) may be prescribed by your  caregiver. Your caregiver may prescribe these if he or she feels the swollen glands are due to a bacterial (germ) infection. Antibiotics are not used if the swollen glands are caused by a virus. HOME CARE INSTRUCTIONS   Take medications as directed by your caregiver. Only take over-the-counter or prescription medicines for pain, discomfort, or fever as directed by your  caregiver. SEEK MEDICAL CARE IF:   If you begin to run a temperature greater than 102 F (38.9 C), or as your caregiver suggests. MAKE SURE YOU:   Understand these instructions.  Will watch your condition.  Will get help right away if you are not doing well or get worse. Document Released: 12/30/2001 Document Revised: 04/03/2011 Document Reviewed: 01/09/2005 [facility] Patient Information 2015 [place], [place]. This information is not intended to replace advice given to you by your health care provider. Make sure you discuss any questions you have with your health care provider.  Lymphadenopathy Lymphadenopathy means "disease of the lymph glands." But the term is usually used to describe swollen or enlarged lymph glands, also called lymph nodes. These are the bean-shaped organs found in many locations including the neck, underarm, and groin. Lymph glands are part of the immune system, which fights infections in your body. Lymphadenopathy can occur in just one area of the body, such as the neck, or it can be generalized, with lymph node enlargement in several areas. The nodes found in the neck are the most common sites of lymphadenopathy. CAUSES When your immune system responds to germs (such as viruses or bacteria ), infection-fighting cells and fluid build up. This causes the glands to grow in size. Usually, this is not something to worry about. Sometimes, the glands themselves can become infected and inflamed. This is called lymphadenitis. Enlarged lymph nodes can be caused by many diseases:  Bacterial disease, such as strep throat or a skin infection.  Viral disease, such as a common cold.  Other germs, such as Lyme disease, tuberculosis, or sexually transmitted diseases.  Cancers, such as lymphoma (cancer of the lymphatic system) or leukemia (cancer of the white blood cells).  Inflammatory diseases such as lupus or rheumatoid arthritis.  Reactions to medications. Many of the diseases  above are rare, but important. This is why you should see your caregiver if you have lymphadenopathy. SYMPTOMS  Swollen, enlarged lumps in the neck, back of the head, or other locations.  Tenderness.  Warmth or redness of the skin over the lymph nodes.  Fever. DIAGNOSIS Enlarged lymph nodes are often near the source of infection. They can help health care providers diagnose your illness. For instance:  Swollen lymph nodes around the jaw might be caused by an infection in the mouth.  Enlarged glands in the neck often signal a throat infection.  Lymph nodes that are swollen in more than one area often indicate an illness caused by a virus. Your caregiver will likely know what is causing your lymphadenopathy after listening to your history and examining you. Blood tests, x-rays, or other tests may be needed. If the cause of the enlarged lymph node cannot be found, and it does not go away by itself, then a biopsy may be needed. Your caregiver will discuss this with you. TREATMENT Treatment for your enlarged lymph nodes will depend on the cause. Many times the nodes will shrink to normal size by themselves, with no treatment. Antibiotics or other medicines may be needed for infection. Only take over-the-counter or prescription medicines for pain, discomfort, or fever as directed  by your caregiver. HOME CARE INSTRUCTIONS Swollen lymph glands usually return to normal when the underlying medical condition goes away. If they persist, contact your health-care provider. He/she might prescribe antibiotics or other treatments, depending on the diagnosis. Take any medications exactly as prescribed. Keep any follow-up appointments made to check on the condition of your enlarged nodes. SEEK MEDICAL CARE IF:  Swelling lasts for more than two weeks.  You have symptoms such as weight loss, night sweats, fatigue, or fever that does not go away.  The lymph nodes are hard, seem fixed to the skin, or are  growing rapidly.  Skin over the lymph nodes is red and inflamed. This could mean there is an infection. SEEK IMMEDIATE MEDICAL CARE IF:  Fluid starts leaking from the area of the enlarged lymph node.  You develop a fever of 102 F (38.9 C) or greater.  Severe pain develops (not necessarily at the site of a large lymph node).  You develop chest pain or shortness of breath.  You develop worsening abdominal pain. MAKE SURE YOU:  Understand these instructions.  Will watch your condition.  Will get help right away if you are not doing well or get worse. Document Released: 10/19/2007 Document Revised: 05/26/2013 Document Reviewed: 10/19/2007 [facility] Patient Information 2015 [place], [place]. This information is not intended to replace advice given to you by your health care provider. Make sure you discuss any questions you have with your health care provider.

## 2014-06-23 ENCOUNTER — Telehealth: Payer: Self-pay | Admitting: *Deleted

## 2014-06-23 ENCOUNTER — Encounter (HOSPITAL_COMMUNITY): Payer: Medicare Other

## 2014-06-23 ENCOUNTER — Ambulatory Visit (HOSPITAL_COMMUNITY): Payer: Medicare Other

## 2014-06-23 DIAGNOSIS — N319 Neuromuscular dysfunction of bladder, unspecified: Secondary | ICD-10-CM | POA: Diagnosis not present

## 2014-06-23 DIAGNOSIS — C61 Malignant neoplasm of prostate: Secondary | ICD-10-CM | POA: Diagnosis not present

## 2014-06-23 DIAGNOSIS — R972 Elevated prostate specific antigen [PSA]: Secondary | ICD-10-CM | POA: Diagnosis not present

## 2014-06-23 NOTE — Telephone Encounter (Signed)
Called patient's wife to let her know that patient's reaction over the weekend is NOT botox related.  Could not leave message and there was cell phone to contact the patient

## 2014-06-23 NOTE — Telephone Encounter (Signed)
Not botox related

## 2014-06-23 NOTE — Telephone Encounter (Signed)
Spoke to patients wife - over the weekend while eating his jaw got swollen and as the day went on they put ice packs on it and it went down.  Then through dinner as he was eating and it happened again.  Wend to ED and they did a CT scan. DX from ED neck swelling, cellulitis of the neck, swollen lymph glands and "gurgling cough". The wife did not tell ED doctor about the Botox injections.

## 2014-06-24 ENCOUNTER — Encounter (HOSPITAL_COMMUNITY): Payer: Medicare Other | Admitting: Occupational Therapy

## 2014-06-24 ENCOUNTER — Ambulatory Visit (INDEPENDENT_AMBULATORY_CARE_PROVIDER_SITE_OTHER): Payer: Medicare Other | Admitting: *Deleted

## 2014-06-24 ENCOUNTER — Ambulatory Visit (HOSPITAL_COMMUNITY): Payer: Medicare Other | Attending: Physical Medicine & Rehabilitation

## 2014-06-24 DIAGNOSIS — R269 Unspecified abnormalities of gait and mobility: Secondary | ICD-10-CM | POA: Diagnosis not present

## 2014-06-24 DIAGNOSIS — Z7901 Long term (current) use of anticoagulants: Secondary | ICD-10-CM

## 2014-06-24 DIAGNOSIS — M6281 Muscle weakness (generalized): Secondary | ICD-10-CM | POA: Insufficient documentation

## 2014-06-24 DIAGNOSIS — S06890S Other specified intracranial injury without loss of consciousness, sequela: Secondary | ICD-10-CM | POA: Diagnosis not present

## 2014-06-24 DIAGNOSIS — I480 Paroxysmal atrial fibrillation: Secondary | ICD-10-CM

## 2014-06-24 DIAGNOSIS — R2689 Other abnormalities of gait and mobility: Secondary | ICD-10-CM

## 2014-06-24 DIAGNOSIS — G8114 Spastic hemiplegia affecting left nondominant side: Secondary | ICD-10-CM | POA: Insufficient documentation

## 2014-06-24 DIAGNOSIS — M5382 Other specified dorsopathies, cervical region: Secondary | ICD-10-CM

## 2014-06-24 DIAGNOSIS — R531 Weakness: Secondary | ICD-10-CM

## 2014-06-24 DIAGNOSIS — M436 Torticollis: Secondary | ICD-10-CM

## 2014-06-24 LAB — POCT INR: INR: 2.6

## 2014-06-24 MED ORDER — WARFARIN SODIUM 5 MG PO TABS: ORAL_TABLET | ORAL | Status: DC
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
## 2014-06-24 NOTE — Telephone Encounter (Signed)
i spoke with wife. ?pharyngitis---has ENT follow up this week

## 2014-06-24 NOTE — Telephone Encounter (Signed)
Spoke to Mrs. Depaulo this am and reassured her that the facial swelling is not related to the Botox injection that the patient had last week.  She is concerned about this "girgling" type of cough he is having.  I suggested that if it persists that she needs to take him to his PCP.

## 2014-06-25 ENCOUNTER — Encounter (HOSPITAL_COMMUNITY): Payer: Medicare Other | Admitting: Occupational Therapy

## 2014-06-25 ENCOUNTER — Ambulatory Visit (HOSPITAL_COMMUNITY): Payer: Medicare Other | Admitting: Physical Therapy

## 2014-06-25 DIAGNOSIS — Z961 Presence of intraocular lens: Secondary | ICD-10-CM | POA: Diagnosis not present

## 2014-06-25 DIAGNOSIS — R1312 Dysphagia, oropharyngeal phase: Secondary | ICD-10-CM | POA: Diagnosis not present

## 2014-06-25 DIAGNOSIS — K1120 Sialoadenitis, unspecified: Secondary | ICD-10-CM | POA: Diagnosis not present

## 2014-06-25 DIAGNOSIS — H5340 Unspecified visual field defects: Secondary | ICD-10-CM | POA: Diagnosis not present

## 2014-06-25 DIAGNOSIS — H43813 Vitreous degeneration, bilateral: Secondary | ICD-10-CM | POA: Diagnosis not present

## 2014-06-25 DIAGNOSIS — H4720 Unspecified optic atrophy: Secondary | ICD-10-CM | POA: Diagnosis not present

## 2014-06-26 ENCOUNTER — Ambulatory Visit (HOSPITAL_COMMUNITY): Payer: Medicare Other | Admitting: Physical Therapy

## 2014-06-26 ENCOUNTER — Encounter (HOSPITAL_COMMUNITY): Payer: Medicare Other

## 2014-06-29 ENCOUNTER — Ambulatory Visit (HOSPITAL_COMMUNITY): Payer: Medicare Other | Admitting: Physical Therapy

## 2014-06-29 ENCOUNTER — Other Ambulatory Visit: Payer: Self-pay | Admitting: Otolaryngology

## 2014-06-29 DIAGNOSIS — R29898 Other symptoms and signs involving the musculoskeletal system: Secondary | ICD-10-CM

## 2014-06-29 DIAGNOSIS — G8110 Spastic hemiplegia affecting unspecified side: Secondary | ICD-10-CM

## 2014-06-29 DIAGNOSIS — R531 Weakness: Secondary | ICD-10-CM

## 2014-06-29 DIAGNOSIS — M6281 Muscle weakness (generalized): Secondary | ICD-10-CM | POA: Diagnosis not present

## 2014-06-29 DIAGNOSIS — M5382 Other specified dorsopathies, cervical region: Secondary | ICD-10-CM

## 2014-06-29 DIAGNOSIS — R1312 Dysphagia, oropharyngeal phase: Secondary | ICD-10-CM

## 2014-06-29 DIAGNOSIS — G8114 Spastic hemiplegia affecting left nondominant side: Secondary | ICD-10-CM | POA: Diagnosis not present

## 2014-06-29 DIAGNOSIS — M436 Torticollis: Secondary | ICD-10-CM

## 2014-06-29 DIAGNOSIS — R293 Abnormal posture: Secondary | ICD-10-CM

## 2014-06-29 DIAGNOSIS — S0193XS Puncture wound without foreign body of unspecified part of head, sequela: Secondary | ICD-10-CM

## 2014-06-29 DIAGNOSIS — W3400XS Accidental discharge from unspecified firearms or gun, sequela: Secondary | ICD-10-CM

## 2014-06-29 DIAGNOSIS — R269 Unspecified abnormalities of gait and mobility: Secondary | ICD-10-CM | POA: Diagnosis not present

## 2014-06-29 DIAGNOSIS — S06890S Other specified intracranial injury without loss of consciousness, sequela: Secondary | ICD-10-CM | POA: Diagnosis not present

## 2014-06-29 DIAGNOSIS — R2689 Other abnormalities of gait and mobility: Secondary | ICD-10-CM
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
## 2014-07-02 ENCOUNTER — Ambulatory Visit
Admission: RE | Admit: 2014-07-02 | Discharge: 2014-07-02 | Disposition: A | Discharge status: Home / Self Care | Payer: Medicare Other | Source: Ambulatory Visit | Attending: Otolaryngology | Admitting: Otolaryngology

## 2014-07-02 DIAGNOSIS — K449 Diaphragmatic hernia without obstruction or gangrene: Secondary | ICD-10-CM | POA: Diagnosis not present

## 2014-07-02 DIAGNOSIS — R1312 Dysphagia, oropharyngeal phase: Secondary | ICD-10-CM | POA: Diagnosis not present

## 2014-07-03 ENCOUNTER — Ambulatory Visit (HOSPITAL_COMMUNITY): Payer: Medicare Other | Admitting: Physical Therapy

## 2014-07-03 DIAGNOSIS — R29898 Other symptoms and signs involving the musculoskeletal system: Secondary | ICD-10-CM

## 2014-07-03 DIAGNOSIS — M436 Torticollis: Secondary | ICD-10-CM

## 2014-07-03 DIAGNOSIS — S06890S Other specified intracranial injury without loss of consciousness, sequela: Secondary | ICD-10-CM | POA: Diagnosis not present

## 2014-07-03 DIAGNOSIS — W3400XS Accidental discharge from unspecified firearms or gun, sequela: Secondary | ICD-10-CM

## 2014-07-03 DIAGNOSIS — R2689 Other abnormalities of gait and mobility: Secondary | ICD-10-CM

## 2014-07-03 DIAGNOSIS — G8110 Spastic hemiplegia affecting unspecified side: Secondary | ICD-10-CM

## 2014-07-03 DIAGNOSIS — S0193XS Puncture wound without foreign body of unspecified part of head, sequela: Secondary | ICD-10-CM

## 2014-07-03 DIAGNOSIS — G8114 Spastic hemiplegia affecting left nondominant side: Secondary | ICD-10-CM | POA: Diagnosis not present

## 2014-07-03 DIAGNOSIS — M6281 Muscle weakness (generalized): Secondary | ICD-10-CM | POA: Diagnosis not present

## 2014-07-03 DIAGNOSIS — M5382 Other specified dorsopathies, cervical region: Secondary | ICD-10-CM

## 2014-07-03 DIAGNOSIS — R531 Weakness: Secondary | ICD-10-CM

## 2014-07-03 DIAGNOSIS — R269 Unspecified abnormalities of gait and mobility: Secondary | ICD-10-CM | POA: Diagnosis not present

## 2014-07-03 DIAGNOSIS — R293 Abnormal posture: Secondary | ICD-10-CM
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
## 2014-07-07 ENCOUNTER — Ambulatory Visit (HOSPITAL_COMMUNITY): Payer: Medicare Other

## 2014-07-07 DIAGNOSIS — R2689 Other abnormalities of gait and mobility: Secondary | ICD-10-CM

## 2014-07-07 DIAGNOSIS — R1312 Dysphagia, oropharyngeal phase: Secondary | ICD-10-CM | POA: Diagnosis not present

## 2014-07-07 DIAGNOSIS — M5382 Other specified dorsopathies, cervical region: Secondary | ICD-10-CM

## 2014-07-07 DIAGNOSIS — S06890S Other specified intracranial injury without loss of consciousness, sequela: Secondary | ICD-10-CM | POA: Diagnosis not present

## 2014-07-07 DIAGNOSIS — G8114 Spastic hemiplegia affecting left nondominant side: Secondary | ICD-10-CM | POA: Diagnosis not present

## 2014-07-07 DIAGNOSIS — M436 Torticollis: Secondary | ICD-10-CM

## 2014-07-07 DIAGNOSIS — R531 Weakness: Secondary | ICD-10-CM

## 2014-07-07 DIAGNOSIS — R269 Unspecified abnormalities of gait and mobility: Secondary | ICD-10-CM | POA: Diagnosis not present

## 2014-07-07 DIAGNOSIS — M6281 Muscle weakness (generalized): Secondary | ICD-10-CM | POA: Diagnosis not present
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
## 2014-07-09 ENCOUNTER — Ambulatory Visit (HOSPITAL_COMMUNITY): Payer: Medicare Other

## 2014-07-09 DIAGNOSIS — R269 Unspecified abnormalities of gait and mobility: Secondary | ICD-10-CM | POA: Diagnosis not present

## 2014-07-09 DIAGNOSIS — R531 Weakness: Secondary | ICD-10-CM

## 2014-07-09 DIAGNOSIS — M6281 Muscle weakness (generalized): Secondary | ICD-10-CM | POA: Diagnosis not present

## 2014-07-09 DIAGNOSIS — M436 Torticollis: Secondary | ICD-10-CM

## 2014-07-09 DIAGNOSIS — R2689 Other abnormalities of gait and mobility: Secondary | ICD-10-CM

## 2014-07-09 DIAGNOSIS — G8114 Spastic hemiplegia affecting left nondominant side: Secondary | ICD-10-CM | POA: Diagnosis not present

## 2014-07-09 DIAGNOSIS — S06890S Other specified intracranial injury without loss of consciousness, sequela: Secondary | ICD-10-CM | POA: Diagnosis not present

## 2014-07-09 DIAGNOSIS — M5382 Other specified dorsopathies, cervical region: Secondary | ICD-10-CM
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
## 2014-07-13 DIAGNOSIS — H4720 Unspecified optic atrophy: Secondary | ICD-10-CM | POA: Diagnosis not present

## 2014-07-13 DIAGNOSIS — H5340 Unspecified visual field defects: Secondary | ICD-10-CM | POA: Diagnosis not present

## 2014-07-14 ENCOUNTER — Telehealth: Payer: Self-pay | Admitting: Internal Medicine

## 2014-07-14 ENCOUNTER — Ambulatory Visit (HOSPITAL_COMMUNITY): Payer: Medicare Other

## 2014-07-14 DIAGNOSIS — M436 Torticollis: Secondary | ICD-10-CM

## 2014-07-14 DIAGNOSIS — R269 Unspecified abnormalities of gait and mobility: Secondary | ICD-10-CM | POA: Diagnosis not present

## 2014-07-14 DIAGNOSIS — M6281 Muscle weakness (generalized): Secondary | ICD-10-CM | POA: Diagnosis not present

## 2014-07-14 DIAGNOSIS — S06890S Other specified intracranial injury without loss of consciousness, sequela: Secondary | ICD-10-CM | POA: Diagnosis not present

## 2014-07-14 DIAGNOSIS — R531 Weakness: Secondary | ICD-10-CM

## 2014-07-14 DIAGNOSIS — G8114 Spastic hemiplegia affecting left nondominant side: Secondary | ICD-10-CM | POA: Diagnosis not present

## 2014-07-14 DIAGNOSIS — M5382 Other specified dorsopathies, cervical region: Secondary | ICD-10-CM

## 2014-07-14 DIAGNOSIS — R2689 Other abnormalities of gait and mobility: Secondary | ICD-10-CM

## 2014-07-14 MED ORDER — OMEPRAZOLE 40 MG PO CPDR: 40.0000 mg | DELAYED_RELEASE_CAPSULE | Freq: Every day | ORAL | Status: DC

## 2014-07-14 NOTE — Telephone Encounter (Signed)
Patient need a refill of Omeprazole 40 mg
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
## 2014-07-16 ENCOUNTER — Ambulatory Visit (HOSPITAL_COMMUNITY): Payer: Medicare Other | Admitting: Physical Therapy

## 2014-07-16 DIAGNOSIS — R29898 Other symptoms and signs involving the musculoskeletal system: Secondary | ICD-10-CM

## 2014-07-16 DIAGNOSIS — M5382 Other specified dorsopathies, cervical region: Secondary | ICD-10-CM

## 2014-07-16 DIAGNOSIS — R269 Unspecified abnormalities of gait and mobility: Secondary | ICD-10-CM | POA: Diagnosis not present

## 2014-07-16 DIAGNOSIS — R293 Abnormal posture: Secondary | ICD-10-CM

## 2014-07-16 DIAGNOSIS — R531 Weakness: Secondary | ICD-10-CM

## 2014-07-16 DIAGNOSIS — R2689 Other abnormalities of gait and mobility: Secondary | ICD-10-CM

## 2014-07-16 DIAGNOSIS — M6281 Muscle weakness (generalized): Secondary | ICD-10-CM | POA: Diagnosis not present

## 2014-07-16 DIAGNOSIS — S06890S Other specified intracranial injury without loss of consciousness, sequela: Secondary | ICD-10-CM | POA: Diagnosis not present

## 2014-07-16 DIAGNOSIS — G8114 Spastic hemiplegia affecting left nondominant side: Secondary | ICD-10-CM | POA: Diagnosis not present

## 2014-07-16 DIAGNOSIS — S069X0A Unspecified intracranial injury without loss of consciousness, initial encounter: Secondary | ICD-10-CM
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
## 2014-07-20 ENCOUNTER — Ambulatory Visit (HOSPITAL_COMMUNITY): Payer: Medicare Other | Admitting: Physical Therapy

## 2014-07-20 ENCOUNTER — Other Ambulatory Visit (HOSPITAL_COMMUNITY): Payer: Self-pay | Admitting: Otolaryngology

## 2014-07-20 DIAGNOSIS — R1310 Dysphagia, unspecified: Secondary | ICD-10-CM

## 2014-07-22 ENCOUNTER — Other Ambulatory Visit (HOSPITAL_COMMUNITY): Payer: Medicare Other

## 2014-07-22 ENCOUNTER — Ambulatory Visit (HOSPITAL_COMMUNITY): Payer: Medicare Other

## 2014-07-22 ENCOUNTER — Ambulatory Visit (INDEPENDENT_AMBULATORY_CARE_PROVIDER_SITE_OTHER): Payer: Medicare Other | Admitting: *Deleted

## 2014-07-22 DIAGNOSIS — Z7901 Long term (current) use of anticoagulants: Secondary | ICD-10-CM

## 2014-07-22 DIAGNOSIS — I480 Paroxysmal atrial fibrillation: Secondary | ICD-10-CM

## 2014-07-22 LAB — POCT INR: INR: 2.1

## 2014-07-25 ENCOUNTER — Other Ambulatory Visit: Payer: Self-pay | Admitting: Physical Medicine & Rehabilitation

## 2014-07-29 ENCOUNTER — Ambulatory Visit (HOSPITAL_COMMUNITY)
Admission: RE | Admit: 2014-07-29 | Discharge: 2014-07-29 | Disposition: A | Discharge status: Home / Self Care | Payer: Medicare Other | Source: Ambulatory Visit | Attending: Otolaryngology | Admitting: Otolaryngology

## 2014-07-29 ENCOUNTER — Other Ambulatory Visit (HOSPITAL_COMMUNITY): Payer: Self-pay | Admitting: Otolaryngology

## 2014-07-29 ENCOUNTER — Ambulatory Visit (HOSPITAL_COMMUNITY): Payer: Medicare Other | Attending: Otolaryngology | Admitting: Speech Pathology

## 2014-07-29 DIAGNOSIS — R1310 Dysphagia, unspecified: Secondary | ICD-10-CM

## 2014-07-29 DIAGNOSIS — R1314 Dysphagia, pharyngoesophageal phase: Secondary | ICD-10-CM | POA: Diagnosis not present
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
## 2014-08-17 ENCOUNTER — Encounter: Payer: Medicare Other | Attending: Physical Medicine & Rehabilitation | Admitting: Physical Medicine & Rehabilitation

## 2014-08-17 ENCOUNTER — Encounter: Payer: Self-pay | Admitting: Physical Medicine & Rehabilitation

## 2014-08-17 VITALS — BP 135/78 | HR 74 | Resp 16

## 2014-08-17 DIAGNOSIS — R1314 Dysphagia, pharyngoesophageal phase: Secondary | ICD-10-CM

## 2014-08-17 DIAGNOSIS — R29898 Other symptoms and signs involving the musculoskeletal system: Secondary | ICD-10-CM | POA: Diagnosis not present

## 2014-08-17 DIAGNOSIS — R2689 Other abnormalities of gait and mobility: Secondary | ICD-10-CM | POA: Insufficient documentation

## 2014-08-17 DIAGNOSIS — Z789 Other specified health status: Secondary | ICD-10-CM | POA: Diagnosis not present

## 2014-08-17 DIAGNOSIS — M436 Torticollis: Secondary | ICD-10-CM | POA: Diagnosis not present

## 2014-08-17 DIAGNOSIS — S069X5S Unspecified intracranial injury with loss of consciousness greater than 24 hours with return to pre-existing conscious level, sequela: Secondary | ICD-10-CM

## 2014-08-17 DIAGNOSIS — R531 Weakness: Secondary | ICD-10-CM | POA: Diagnosis not present

## 2014-08-17 DIAGNOSIS — M5382 Other specified dorsopathies, cervical region: Secondary | ICD-10-CM | POA: Diagnosis not present

## 2014-08-17 DIAGNOSIS — G8110 Spastic hemiplegia affecting unspecified side: Secondary | ICD-10-CM | POA: Diagnosis not present

## 2014-08-17 DIAGNOSIS — G8114 Spastic hemiplegia affecting left nondominant side: Secondary | ICD-10-CM

## 2014-08-17 MED ORDER — BACLOFEN 10 MG PO TABS: 10.0000 mg | ORAL_TABLET | Freq: Four times a day (QID) | ORAL | Status: DC

## 2014-08-17 NOTE — Patient Instructions (Signed)
CONTINUE TO WORK AGGRESSIVELY ON RANGE OF MOTION.   TAKE BACLOFEN THREE X DAILY FOR ONE WEEK, THEN INCREASE TO FOUR TIMES DAILY.
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
## 2014-08-26 ENCOUNTER — Ambulatory Visit (INDEPENDENT_AMBULATORY_CARE_PROVIDER_SITE_OTHER): Payer: Medicare Other | Admitting: *Deleted

## 2014-08-26 DIAGNOSIS — I480 Paroxysmal atrial fibrillation: Secondary | ICD-10-CM

## 2014-08-26 DIAGNOSIS — Z7901 Long term (current) use of anticoagulants: Secondary | ICD-10-CM | POA: Diagnosis not present

## 2014-08-26 LAB — POCT INR: INR: 1.8

## 2014-08-28 ENCOUNTER — Telehealth: Payer: Self-pay | Admitting: *Deleted

## 2014-08-28 MED ORDER — LISINOPRIL-HYDROCHLOROTHIAZIDE 10-12.5 MG PO TABS: 1.0000 | ORAL_TABLET | Freq: Every day | ORAL | Status: DC

## 2014-08-28 NOTE — Telephone Encounter (Signed)
Received call from pt wife he is needing refill on his lisinopril. Verified pharmacy inform wife will send to [facility]. Sent electronically...[name]

## 2014-09-09 ENCOUNTER — Ambulatory Visit (INDEPENDENT_AMBULATORY_CARE_PROVIDER_SITE_OTHER): Payer: Medicare Other | Admitting: *Deleted

## 2014-09-09 DIAGNOSIS — I480 Paroxysmal atrial fibrillation: Secondary | ICD-10-CM

## 2014-09-09 DIAGNOSIS — Z7901 Long term (current) use of anticoagulants: Secondary | ICD-10-CM | POA: Diagnosis not present

## 2014-09-09 LAB — POCT INR: INR: 2

## 2014-09-09 MED ORDER — WARFARIN SODIUM 5 MG PO TABS: ORAL_TABLET | ORAL | Status: DC

## 2014-09-25 ENCOUNTER — Ambulatory Visit: Payer: Medicare Other | Admitting: Physical Medicine & Rehabilitation

## 2014-09-29 ENCOUNTER — Encounter: Payer: Self-pay | Admitting: Physical Medicine & Rehabilitation

## 2014-09-29 ENCOUNTER — Encounter: Payer: Medicare Other | Attending: Physical Medicine & Rehabilitation | Admitting: Physical Medicine & Rehabilitation

## 2014-09-29 VITALS — BP 142/62 | HR 71

## 2014-09-29 DIAGNOSIS — R531 Weakness: Secondary | ICD-10-CM | POA: Insufficient documentation

## 2014-09-29 DIAGNOSIS — G8110 Spastic hemiplegia affecting unspecified side: Secondary | ICD-10-CM

## 2014-09-29 DIAGNOSIS — M5382 Other specified dorsopathies, cervical region: Secondary | ICD-10-CM | POA: Diagnosis not present

## 2014-09-29 DIAGNOSIS — R29898 Other symptoms and signs involving the musculoskeletal system: Secondary | ICD-10-CM | POA: Diagnosis not present

## 2014-09-29 DIAGNOSIS — Z789 Other specified health status: Secondary | ICD-10-CM | POA: Diagnosis not present

## 2014-09-29 DIAGNOSIS — R2689 Other abnormalities of gait and mobility: Secondary | ICD-10-CM | POA: Insufficient documentation

## 2014-09-29 DIAGNOSIS — G8114 Spastic hemiplegia affecting left nondominant side: Secondary | ICD-10-CM

## 2014-09-29 DIAGNOSIS — M436 Torticollis: Secondary | ICD-10-CM | POA: Diagnosis not present

## 2014-09-29 NOTE — Progress Notes (Signed)
Botox Injection for spasticity using needle EMG guidance Indication: spastic left hemiparesis, torticollis, Left upper ext, trunk, neck  Dilution: 100 Units/ml Total Units Injected: 500 Indication: Severe spasticity which interferes with ADL,mobility and/or hygiene and is unresponsive to medication management and other conservative care Informed consent was obtained after describing risks and benefits of the procedure with the patient. This includes bleeding, bruising, infection, excessive weakness, or medication side effects. A REMS form is on file and signed.  Needle: 3m injectable monopolar needle electrode  Number of units per muscle Right SCM: 0u Left SCM: 0u Left Teres Major 75 u Left teres minor 25 u Pectoralis Major 25 units Pectoralis Minor 75 units Biceps 0 units Brachioradialis 0 units FCR 0 units FCU 0 units FDS 100 units FDP 100 units FPL 50 units Pronator Teres 50 units Pronator Quadratus 0 units Quadriceps 0 units Gastroc/soleus 0 units Hamstrings 0 units Tibialis Posterior 0 units EHL 0 units All injections were done after obtaining appropriate EMG activity and after negative drawback for blood. The patient tolerated the procedure well. Post procedure instructions were given. A followup appointment was made for 2 months.

## 2014-10-02 ENCOUNTER — Ambulatory Visit (INDEPENDENT_AMBULATORY_CARE_PROVIDER_SITE_OTHER): Payer: Medicare Other | Admitting: *Deleted

## 2014-10-02 DIAGNOSIS — Z7901 Long term (current) use of anticoagulants: Secondary | ICD-10-CM | POA: Diagnosis not present

## 2014-10-02 DIAGNOSIS — I480 Paroxysmal atrial fibrillation: Secondary | ICD-10-CM | POA: Diagnosis not present

## 2014-10-02 LAB — POCT INR: INR: 2.4

## 2014-10-28 ENCOUNTER — Ambulatory Visit (INDEPENDENT_AMBULATORY_CARE_PROVIDER_SITE_OTHER): Payer: Medicare Other | Admitting: *Deleted

## 2014-10-28 DIAGNOSIS — I480 Paroxysmal atrial fibrillation: Secondary | ICD-10-CM

## 2014-10-28 DIAGNOSIS — Z7901 Long term (current) use of anticoagulants: Secondary | ICD-10-CM

## 2014-10-28 LAB — POCT INR: INR: 2.5

## 2014-11-16 DIAGNOSIS — H4720 Unspecified optic atrophy: Secondary | ICD-10-CM | POA: Diagnosis not present

## 2014-11-16 DIAGNOSIS — H5340 Unspecified visual field defects: Secondary | ICD-10-CM | POA: Diagnosis not present

## 2014-11-19 ENCOUNTER — Ambulatory Visit (INDEPENDENT_AMBULATORY_CARE_PROVIDER_SITE_OTHER): Payer: Medicare Other | Admitting: Internal Medicine

## 2014-11-19 ENCOUNTER — Encounter: Payer: Self-pay | Admitting: Internal Medicine

## 2014-11-19 VITALS — BP 124/72 | HR 72 | Temp 98.4°F

## 2014-11-19 DIAGNOSIS — E785 Hyperlipidemia, unspecified: Secondary | ICD-10-CM | POA: Diagnosis not present

## 2014-11-19 DIAGNOSIS — I872 Venous insufficiency (chronic) (peripheral): Secondary | ICD-10-CM

## 2014-11-19 DIAGNOSIS — I10 Essential (primary) hypertension: Secondary | ICD-10-CM

## 2014-11-19 MED ORDER — SIMVASTATIN 40 MG PO TABS: 20.0000 mg | ORAL_TABLET | Freq: Every day | ORAL | Status: DC

## 2014-11-19 MED ORDER — OMEPRAZOLE 40 MG PO CPDR: 40.0000 mg | DELAYED_RELEASE_CAPSULE | Freq: Every day | ORAL | Status: DC

## 2014-11-19 MED ORDER — LISINOPRIL-HYDROCHLOROTHIAZIDE 10-12.5 MG PO TABS: 1.0000 | ORAL_TABLET | Freq: Every day | ORAL | Status: DC

## 2014-11-19 MED ORDER — ROSUVASTATIN CALCIUM 20 MG PO TABS: 20.0000 mg | ORAL_TABLET | Freq: Every day | ORAL | Status: DC

## 2014-11-19 NOTE — Assessment & Plan Note (Signed)
stable overall by history and exam, recent data reviewed with pt, and pt to continue medical treatment as before,  to f/u any worsening symptoms or concerns BP Readings from Last 3 Encounters:  11/19/14 124/72  09/29/14 142/62  08/17/14 135/78

## 2014-11-19 NOTE — Assessment & Plan Note (Signed)
Uncontrolled Lab Results  Component Value Date   LDLCALC 110* 04/03/2013   Ok to change to crestor 20 qd, f/u labs next visit

## 2014-11-19 NOTE — Progress Notes (Signed)
Pre visit review using our clinic review tool, if applicable. No additional management support is needed unless otherwise documented below in the visit note. 

## 2014-11-19 NOTE — Assessment & Plan Note (Signed)
Mild, dw pt leg elevation, compressoin stockings or support hose, activity as tolerated, and wt control

## 2014-11-19 NOTE — Progress Notes (Signed)
Subjective:    Patient ID: Austin [name], male    DOB: 1932-12-06, 81 y.o.   MRN: [id number]  HPI  Here to f/u; overall doing ok,  Pt denies chest pain, increasing sob or doe, wheezing, orthopnea, PND, increased LE swelling, palpitations, dizziness or syncope.  Pt denies new neurological symptoms such as new headache, or facial or extremity weakness or numbness.  Pt denies polydipsia, polyuria, or low sugar episode.   Pt denies new neurological symptoms such as new headache, or facial or extremity weakness or numbness.   Pt states overall good compliance with meds, mostly trying to follow appropriate diet, with wt overall stable,  but little exercise however. Declines immunizations today.  Past Medical History  Diagnosis Date  . ADENOCARCINOMA, PROSTATE, GLEASON GRADE 5 01/21/2009  . ALLERGIC RHINITIS 01/14/2007  . COLONIC POLYPS, HX OF 01/14/2007  . ELEVATED PROSTATE SPECIFIC ANTIGEN 03/27/2008  . ESOPHAGITIS 01/14/2007  . HYPERLIPIDEMIA 01/14/2007  . HYPERTENSION 01/14/2007  . LIBIDO, DECREASED 01/15/2007  . Hypertension   . Hypercholesterolemia    Past Surgical History  Procedure Laterality Date  . Hernia repair    . Prostate cryoablation    . Esophagogastroduodenoscopy N/A 08/15/2013    Procedure: ESOPHAGOGASTRODUODENOSCOPY (EGD);  Surgeon: Gwenyth Ober, MD;  [facility];  Service: General;  Laterality: N/A;  . Peg placement N/A 09/02/2013    Procedure: PERCUTANEOUS ENDOSCOPIC GASTROSTOMY (PEG) PLACEMENT;  Surgeon: Gwenyth Ober, MD;  Location: [place];  Service: General;  Laterality: N/A;    reports that he has quit smoking. He does not have any smokeless tobacco history on file. He reports that he does not drink alcohol or use illicit drugs. family history includes Arthritis in an other family member; Hypertension in his other; Lung cancer in his father. No Known Allergies Current Outpatient Prescriptions on File Prior to Visit  Medication Sig Dispense Refill    . baclofen (LIORESAL) 10 MG tablet Take 1 tablet (10 mg total) by mouth 4 (four) times daily. 120 each 3  . Multiple Vitamin (MULTIVITAMIN) LIQD Place 5 mLs into feeding tube daily. (Patient taking differently: Take 5 mLs by mouth daily. ) 240 mL 1  . warfarin (COUMADIN) 5 MG tablet Take 1 1/2 tablets daily except 2 tablets on Wednesdays and Saturdays 60 tablet 3   No current facility-administered medications on file prior to visit.    Review of Systems  Constitutional: Negative for unusual diaphoresis or night sweats HENT: Negative for ringing in ear or discharge Eyes: Negative for double vision or worsening visual disturbance.  Respiratory: Negative for choking and stridor.   Gastrointestinal: Negative for vomiting or other signifcant bowel change Genitourinary: Negative for hematuria or change in urine volume.  Musculoskeletal: Negative for other MSK pain or swelling Skin: Negative for color change and worsening wound.  Neurological: Negative for tremors and numbness other than noted  Psychiatric/Behavioral: Negative for decreased concentration or agitation other than above       Objective:   Physical Exam BP 124/72 mmHg  Pulse 72  Temp(Src) 98.4 F (36.9 C) (Oral)  SpO2 96% VS noted,  Constitutional: Pt appears in no significant distress HENT: Head: NCAT.  Right Ear: External ear normal.  Left Ear: External ear normal.  Eyes: . Pupils are equal, round, and reactive to light. Conjunctivae and EOM are normal Neck: Normal range of motion. Neck supple.  Cardiovascular: Normal rate and regular rhythm.   Pulmonary/Chest: Effort normal and breath sounds without rales or wheezing.  Abd:  Soft, NT, ND, + BS Neurological: [name] alert. Not confused , motor grossly intact Skin: Skin is warm. No rash, trace bilat LE edema Psychiatric: Pt behavior is normal. No agitation.      Assessment & Plan:

## 2014-11-19 NOTE — Patient Instructions (Signed)
Please continue all other medications as before, and refills have been done if requested.  Please have the pharmacy call with any other refills you may need.  Please continue your efforts at being more active, low cholesterol diet, and weight control.  You are otherwise up to date with prevention measures today.  Please keep your appointments with your specialists as you may have planned  Please return in 6 months, or sooner if needed

## 2014-11-23 ENCOUNTER — Telehealth: Payer: Self-pay | Admitting: Internal Medicine

## 2014-11-23 NOTE — Telephone Encounter (Signed)
They are calling to see if patient should be off of blood thinners before getting a filling procedure. Please fax clearance letter to fax # [phone]

## 2014-11-25 ENCOUNTER — Ambulatory Visit (INDEPENDENT_AMBULATORY_CARE_PROVIDER_SITE_OTHER): Payer: Medicare Other | Admitting: *Deleted

## 2014-11-25 DIAGNOSIS — I480 Paroxysmal atrial fibrillation: Secondary | ICD-10-CM | POA: Diagnosis not present

## 2014-11-25 DIAGNOSIS — Z7901 Long term (current) use of anticoagulants: Secondary | ICD-10-CM | POA: Diagnosis not present

## 2014-11-25 LAB — POCT INR: INR: 2.8

## 2014-11-25 NOTE — Telephone Encounter (Signed)
Please clarify:  "they" is who?  "filling procedure" - is this a simple dental cavity filling?  thanks

## 2014-11-26 NOTE — Telephone Encounter (Signed)
My apolgies, i left the contact info on the contacts tab: 11/23/2014 01:55 PM Phone (Incoming) Janett Billow- Dr Callie Fielding [phone]    The procedure is a simple dental cavity filling, based on what the hygienist advised me.

## 2014-11-27 NOTE — Telephone Encounter (Signed)
Called and advised.

## 2014-11-27 NOTE — Telephone Encounter (Signed)
OK for routine dental cavity filling without change in coumadin, thanks

## 2014-12-01 ENCOUNTER — Encounter: Payer: Medicare Other | Attending: Physical Medicine & Rehabilitation | Admitting: Physical Medicine & Rehabilitation

## 2014-12-01 ENCOUNTER — Encounter: Payer: Self-pay | Admitting: Physical Medicine & Rehabilitation

## 2014-12-01 VITALS — BP 142/63 | HR 83 | Resp 16

## 2014-12-01 DIAGNOSIS — S069X4S Unspecified intracranial injury with loss of consciousness of 6 hours to 24 hours, sequela: Secondary | ICD-10-CM | POA: Diagnosis not present

## 2014-12-01 DIAGNOSIS — G8114 Spastic hemiplegia affecting left nondominant side: Secondary | ICD-10-CM

## 2014-12-01 DIAGNOSIS — M436 Torticollis: Secondary | ICD-10-CM | POA: Diagnosis not present

## 2014-12-01 DIAGNOSIS — M5382 Other specified dorsopathies, cervical region: Secondary | ICD-10-CM | POA: Insufficient documentation

## 2014-12-01 DIAGNOSIS — R531 Weakness: Secondary | ICD-10-CM | POA: Diagnosis not present

## 2014-12-01 DIAGNOSIS — M7502 Adhesive capsulitis of left shoulder: Secondary | ICD-10-CM

## 2014-12-01 DIAGNOSIS — R29898 Other symptoms and signs involving the musculoskeletal system: Secondary | ICD-10-CM | POA: Insufficient documentation

## 2014-12-01 DIAGNOSIS — R2689 Other abnormalities of gait and mobility: Secondary | ICD-10-CM | POA: Insufficient documentation

## 2014-12-01 DIAGNOSIS — Z789 Other specified health status: Secondary | ICD-10-CM | POA: Diagnosis not present
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
## 2014-12-01 NOTE — Patient Instructions (Signed)
ELEVATE THE LEFT LEG WHEN YOU CAN.   CONSIDER AN ACE WRAP FOR THE RIGHT LEG IF SWELLING INCREASES. WRAP FROM FOOT TO KNEE.

## 2014-12-30 ENCOUNTER — Encounter: Payer: Self-pay | Admitting: Physical Medicine & Rehabilitation

## 2014-12-30 ENCOUNTER — Encounter: Payer: Medicare Other | Attending: Physical Medicine & Rehabilitation | Admitting: Physical Medicine & Rehabilitation

## 2014-12-30 VITALS — BP 148/60 | HR 74

## 2014-12-30 DIAGNOSIS — G8114 Spastic hemiplegia affecting left nondominant side: Secondary | ICD-10-CM | POA: Diagnosis not present

## 2014-12-30 DIAGNOSIS — Z789 Other specified health status: Secondary | ICD-10-CM | POA: Diagnosis not present

## 2014-12-30 DIAGNOSIS — M436 Torticollis: Secondary | ICD-10-CM | POA: Diagnosis not present

## 2014-12-30 DIAGNOSIS — R531 Weakness: Secondary | ICD-10-CM | POA: Insufficient documentation

## 2014-12-30 DIAGNOSIS — R2689 Other abnormalities of gait and mobility: Secondary | ICD-10-CM | POA: Diagnosis not present

## 2014-12-30 DIAGNOSIS — S069X5S Unspecified intracranial injury with loss of consciousness greater than 24 hours with return to pre-existing conscious level, sequela: Secondary | ICD-10-CM | POA: Diagnosis not present

## 2014-12-30 DIAGNOSIS — M5382 Other specified dorsopathies, cervical region: Secondary | ICD-10-CM | POA: Insufficient documentation

## 2014-12-30 DIAGNOSIS — R29898 Other symptoms and signs involving the musculoskeletal system: Secondary | ICD-10-CM | POA: Insufficient documentation

## 2014-12-30 MED ORDER — BACLOFEN 10 MG PO TABS: 10.0000 mg | ORAL_TABLET | Freq: Four times a day (QID) | ORAL | Status: DC

## 2014-12-30 NOTE — Progress Notes (Signed)
Botox Injection for spasticity using needle EMG guidance Indication: G81.14  Dilution: 100 Units/ml        Total Units Injected: 300 Indication: Severe spasticity which interferes with ADL,mobility and/or  hygiene and is unresponsive to medication management and other conservative care Informed consent was obtained after describing risks and benefits of the procedure with the patient. This includes bleeding, bruising, infection, excessive weakness, or medication side effects. A REMS form is on file and signed.  Needle: 79m injectable monopolar needle electrode  Number of units per muscle  Quadriceps 300 units in separate access points Gastroc/soleus 0 units Hamstrings 0 units Tibialis Posterior 0 units EHL 0 units All injections were done after obtaining appropriate EMG activity and after negative drawback for blood. The patient tolerated the procedure well. Post procedure instructions were given. A followup appointment was made.

## 2014-12-30 NOTE — Patient Instructions (Signed)
PLEASE CALL ME WITH ANY PROBLEMS OR QUESTIONS (#[phone]). HAVE A HAPPY HOLIDAY SEASON!!!    

## 2015-01-06 ENCOUNTER — Ambulatory Visit (INDEPENDENT_AMBULATORY_CARE_PROVIDER_SITE_OTHER): Payer: Medicare Other | Admitting: *Deleted

## 2015-01-06 ENCOUNTER — Other Ambulatory Visit: Payer: Self-pay | Admitting: Internal Medicine

## 2015-01-06 DIAGNOSIS — I480 Paroxysmal atrial fibrillation: Secondary | ICD-10-CM | POA: Diagnosis not present

## 2015-01-06 DIAGNOSIS — Z7901 Long term (current) use of anticoagulants: Secondary | ICD-10-CM

## 2015-01-06 LAB — POCT INR: INR: 3

## 2015-02-17 ENCOUNTER — Ambulatory Visit (INDEPENDENT_AMBULATORY_CARE_PROVIDER_SITE_OTHER): Payer: Medicare Other | Admitting: *Deleted

## 2015-02-17 DIAGNOSIS — I480 Paroxysmal atrial fibrillation: Secondary | ICD-10-CM

## 2015-02-17 DIAGNOSIS — Z7901 Long term (current) use of anticoagulants: Secondary | ICD-10-CM

## 2015-02-17 LAB — POCT INR: INR: 2.8

## 2015-02-26 ENCOUNTER — Other Ambulatory Visit: Payer: Self-pay | Admitting: Internal Medicine

## 2015-03-02 ENCOUNTER — Encounter: Payer: Self-pay | Admitting: Physical Medicine & Rehabilitation

## 2015-03-02 ENCOUNTER — Encounter: Payer: Medicare Other | Attending: Physical Medicine & Rehabilitation | Admitting: Physical Medicine & Rehabilitation

## 2015-03-02 VITALS — BP 132/67 | HR 75 | Resp 14

## 2015-03-02 DIAGNOSIS — M436 Torticollis: Secondary | ICD-10-CM | POA: Diagnosis not present

## 2015-03-02 DIAGNOSIS — M25532 Pain in left wrist: Secondary | ICD-10-CM | POA: Diagnosis not present

## 2015-03-02 DIAGNOSIS — R2689 Other abnormalities of gait and mobility: Secondary | ICD-10-CM | POA: Insufficient documentation

## 2015-03-02 DIAGNOSIS — S069X9S Unspecified intracranial injury with loss of consciousness of unspecified duration, sequela: Secondary | ICD-10-CM

## 2015-03-02 DIAGNOSIS — M7502 Adhesive capsulitis of left shoulder: Secondary | ICD-10-CM | POA: Diagnosis not present

## 2015-03-02 DIAGNOSIS — Z789 Other specified health status: Secondary | ICD-10-CM | POA: Insufficient documentation

## 2015-03-02 DIAGNOSIS — M5382 Other specified dorsopathies, cervical region: Secondary | ICD-10-CM | POA: Diagnosis not present

## 2015-03-02 DIAGNOSIS — R29898 Other symptoms and signs involving the musculoskeletal system: Secondary | ICD-10-CM | POA: Diagnosis not present

## 2015-03-02 DIAGNOSIS — R531 Weakness: Secondary | ICD-10-CM | POA: Diagnosis not present

## 2015-03-02 DIAGNOSIS — G8114 Spastic hemiplegia affecting left nondominant side: Secondary | ICD-10-CM

## 2015-03-02 MED ORDER — DICLOFENAC SODIUM 1 % TD GEL: 1.0000 "application " | Freq: Three times a day (TID) | TRANSDERMAL | Status: DC

## 2015-03-02 NOTE — Patient Instructions (Signed)
KEEP STRETCHING YOUR WRIST. WEAR YOUR SPLINT  APPLY HEAT AND OR ICE ALSO FOR RELIEF.
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
## 2015-03-03 ENCOUNTER — Ambulatory Visit (HOSPITAL_COMMUNITY)
Admission: RE | Admit: 2015-03-03 | Discharge: 2015-03-03 | Disposition: A | Discharge status: Home / Self Care | Payer: Medicare Other | Source: Ambulatory Visit | Attending: Physical Medicine & Rehabilitation | Admitting: Physical Medicine & Rehabilitation

## 2015-03-03 ENCOUNTER — Telehealth: Payer: Self-pay | Admitting: Physical Medicine & Rehabilitation

## 2015-03-03 DIAGNOSIS — M19032 Primary osteoarthritis, left wrist: Secondary | ICD-10-CM | POA: Diagnosis not present

## 2015-03-03 DIAGNOSIS — G8320 Monoplegia of upper limb affecting unspecified side: Secondary | ICD-10-CM | POA: Insufficient documentation

## 2015-03-03 DIAGNOSIS — M25532 Pain in left wrist: Secondary | ICD-10-CM | POA: Insufficient documentation

## 2015-03-03 NOTE — Progress Notes (Signed)
Xray shows mild oa of wrist/ wrist-thumb joints

## 2015-03-03 NOTE — Telephone Encounter (Signed)
Xray shows Mild arthritis of wrist/hand. Continue with plan i discussed at last visit for now.

## 2015-03-03 NOTE — Telephone Encounter (Signed)
Pt's wife advised.

## 2015-03-08 ENCOUNTER — Telehealth: Payer: Self-pay | Admitting: *Deleted

## 2015-03-08 NOTE — Telephone Encounter (Signed)
Pt fell over the weekend and his wife would like to know what they should do about his coumadin

## 2015-03-08 NOTE — Telephone Encounter (Signed)
Pt fell Sat night transferring from chair to wheelchair.  Has bruise on back where he it against buckle on transfer belt.  No pain or swelling at the site. Did not hit head.  Told pt to continue coumadin as ordered and call if bruising gets worse.  Wife verbalized understanding.

## 2015-03-17 DIAGNOSIS — H5340 Unspecified visual field defects: Secondary | ICD-10-CM | POA: Diagnosis not present

## 2015-03-17 DIAGNOSIS — H4720 Unspecified optic atrophy: Secondary | ICD-10-CM | POA: Diagnosis not present

## 2015-03-22 ENCOUNTER — Encounter (HOSPITAL_BASED_OUTPATIENT_CLINIC_OR_DEPARTMENT_OTHER): Payer: Medicare Other | Admitting: Physical Medicine & Rehabilitation

## 2015-03-22 ENCOUNTER — Encounter: Payer: Self-pay | Admitting: Physical Medicine & Rehabilitation

## 2015-03-22 VITALS — BP 133/71 | HR 72 | Resp 14

## 2015-03-22 DIAGNOSIS — M436 Torticollis: Secondary | ICD-10-CM | POA: Diagnosis not present

## 2015-03-22 DIAGNOSIS — R2689 Other abnormalities of gait and mobility: Secondary | ICD-10-CM | POA: Diagnosis not present

## 2015-03-22 DIAGNOSIS — R531 Weakness: Secondary | ICD-10-CM | POA: Diagnosis not present

## 2015-03-22 DIAGNOSIS — M5382 Other specified dorsopathies, cervical region: Secondary | ICD-10-CM | POA: Diagnosis not present

## 2015-03-22 DIAGNOSIS — G8114 Spastic hemiplegia affecting left nondominant side: Secondary | ICD-10-CM

## 2015-03-22 DIAGNOSIS — R29898 Other symptoms and signs involving the musculoskeletal system: Secondary | ICD-10-CM | POA: Diagnosis not present

## 2015-03-22 DIAGNOSIS — Z789 Other specified health status: Secondary | ICD-10-CM | POA: Diagnosis not present

## 2015-03-22 NOTE — Patient Instructions (Signed)
PLEASE CALL ME WITH ANY PROBLEMS OR QUESTIONS (#[phone]).

## 2015-03-22 NOTE — Progress Notes (Signed)
Botox Injection for spasticity using needle EMG guidance Indication: spastic left hemiparesis  Dilution: 100 Units/ml        Total Units Injected: 300 Indication: Severe spasticity which interferes with ADL,mobility and/or  hygiene and is unresponsive to medication management and other conservative care Informed consent was obtained after describing risks and benefits of the procedure with the patient. This includes bleeding, bruising, infection, excessive weakness, or medication side effects. A REMS form is on file and signed.  Needle: 43m injectable monopolar needle electrode  Number of units per muscle Pectoralis Major 0 units Pectoralis Minor 0 units Biceps 0 units Brachioradialis 0 units FCR 25 units FCU 25 units FDS 100 units FDP 100 units FPL 50 units Pronator Teres 0 units Pronator Quadratus 0 units Quadriceps 0 units Gastroc/soleus 0 units Hamstrings 0 units Tibialis Posterior 0 units EHL 0 units All injections were done after obtaining appropriate EMG activity and after negative drawback for blood. The patient tolerated the procedure well. Post procedure instructions were given. A followup appointment was made.

## 2015-03-31 ENCOUNTER — Ambulatory Visit (INDEPENDENT_AMBULATORY_CARE_PROVIDER_SITE_OTHER): Payer: Medicare Other | Admitting: Pharmacist

## 2015-03-31 DIAGNOSIS — Z7901 Long term (current) use of anticoagulants: Secondary | ICD-10-CM | POA: Diagnosis not present

## 2015-03-31 DIAGNOSIS — I480 Paroxysmal atrial fibrillation: Secondary | ICD-10-CM | POA: Diagnosis not present

## 2015-03-31 LAB — POCT INR: INR: 2.9

## 2015-04-20 ENCOUNTER — Encounter (HOSPITAL_COMMUNITY): Payer: Self-pay

## 2015-04-30 ENCOUNTER — Encounter: Payer: Self-pay | Admitting: Internal Medicine

## 2015-04-30 ENCOUNTER — Other Ambulatory Visit (INDEPENDENT_AMBULATORY_CARE_PROVIDER_SITE_OTHER): Payer: Medicare Other

## 2015-04-30 ENCOUNTER — Ambulatory Visit (INDEPENDENT_AMBULATORY_CARE_PROVIDER_SITE_OTHER): Payer: Medicare Other | Admitting: Internal Medicine

## 2015-04-30 ENCOUNTER — Telehealth: Payer: Self-pay

## 2015-04-30 ENCOUNTER — Other Ambulatory Visit: Payer: Self-pay | Admitting: Internal Medicine

## 2015-04-30 VITALS — BP 138/80 | HR 74 | Temp 98.2°F | Resp 20 | Wt 191.0 lb

## 2015-04-30 DIAGNOSIS — I872 Venous insufficiency (chronic) (peripheral): Secondary | ICD-10-CM

## 2015-04-30 DIAGNOSIS — E785 Hyperlipidemia, unspecified: Secondary | ICD-10-CM

## 2015-04-30 DIAGNOSIS — I10 Essential (primary) hypertension: Secondary | ICD-10-CM

## 2015-04-30 DIAGNOSIS — N183 Chronic kidney disease, stage 3 unspecified: Secondary | ICD-10-CM

## 2015-04-30 DIAGNOSIS — N1832 Chronic kidney disease, stage 3b: Secondary | ICD-10-CM | POA: Insufficient documentation

## 2015-04-30 HISTORY — DX: Chronic kidney disease, stage 3 unspecified: N18.30

## 2015-04-30 LAB — LIPID PANEL
Cholesterol: 161 mg/dL (ref 0–200)
HDL: 34.4 mg/dL — ABNORMAL LOW (ref >39.00)
NonHDL: 126.16
Total CHOL/HDL Ratio: 5
Triglycerides: 253 mg/dL — ABNORMAL HIGH (ref 0.0–149.0)
VLDL: 50.6 mg/dL — ABNORMAL HIGH (ref 0.0–40.0)

## 2015-04-30 LAB — BASIC METABOLIC PANEL
BUN: 21 mg/dL (ref 6–23)
CO2: 28 mEq/L (ref 19–32)
Calcium: 9.3 mg/dL (ref 8.4–10.5)
Chloride: 104 mEq/L (ref 96–112)
Creatinine, Ser: 1.53 mg/dL — ABNORMAL HIGH (ref 0.40–1.50)
GFR: 46.75 mL/min — ABNORMAL LOW (ref >60.00)
Glucose, Bld: 89 mg/dL (ref 70–99)
Potassium: 4.1 mEq/L (ref 3.5–5.1)
Sodium: 140 mEq/L (ref 135–145)

## 2015-04-30 LAB — CBC WITH DIFFERENTIAL/PLATELET
Basophils Absolute: 0 10*3/uL (ref 0.0–0.1)
Basophils Relative: 0.2 % (ref 0.0–3.0)
Eosinophils Absolute: 0.2 10*3/uL (ref 0.0–0.7)
Eosinophils Relative: 2.9 % (ref 0.0–5.0)
HCT: 40.2 % (ref 39.0–52.0)
Hemoglobin: 13.8 g/dL (ref 13.0–17.0)
Lymphocytes Relative: 20.5 % (ref 12.0–46.0)
Lymphs Abs: 1.7 10*3/uL (ref 0.7–4.0)
MCHC: 34.3 g/dL (ref 30.0–36.0)
MCV: 89.9 fl (ref 78.0–100.0)
Monocytes Absolute: 1 10*3/uL (ref 0.1–1.0)
Monocytes Relative: 11.6 % (ref 3.0–12.0)
Neutro Abs: 5.4 10*3/uL (ref 1.4–7.7)
Neutrophils Relative %: 64.8 % (ref 43.0–77.0)
Platelets: 179 10*3/uL (ref 150.0–400.0)
RBC: 4.47 Mil/uL (ref 4.22–5.81)
RDW: 14.8 % (ref 11.5–15.5)
WBC: 8.4 10*3/uL (ref 4.0–10.5)

## 2015-04-30 LAB — HEPATIC FUNCTION PANEL
ALT: 10 U/L (ref 0–53)
AST: 14 U/L (ref 0–37)
Albumin: 4.1 g/dL (ref 3.5–5.2)
Alkaline Phosphatase: 61 U/L (ref 39–117)
Bilirubin, Direct: 0.1 mg/dL (ref 0.0–0.3)
Total Bilirubin: 0.6 mg/dL (ref 0.2–1.2)
Total Protein: 7.3 g/dL (ref 6.0–8.3)

## 2015-04-30 LAB — TSH: TSH: 1.73 u[IU]/mL (ref 0.35–4.50)

## 2015-04-30 LAB — LDL CHOLESTEROL, DIRECT: Direct LDL: 83 mg/dL

## 2015-04-30 MED ORDER — LISINOPRIL-HYDROCHLOROTHIAZIDE 10-12.5 MG PO TABS: 1.0000 | ORAL_TABLET | Freq: Every day | ORAL | Status: DC

## 2015-04-30 NOTE — Telephone Encounter (Signed)
Medications sent to pharmacy

## 2015-04-30 NOTE — Patient Instructions (Signed)

## 2015-04-30 NOTE — Progress Notes (Addendum)
Subjective:    Patient ID: Austin [name], male    DOB: 10-15-1932, 82 y.o.   MRN: [id number]  HPI  Here for yearly f/u;  Overall doing ok;  Pt denies Chest pain, worsening SOB, DOE, wheezing, orthopnea, PND, worsening LE edema, palpitations, dizziness or syncope.  Pt denies neurological change such as new headache, facial or extremity weakness.  Pt denies polydipsia, polyuria, or low sugar symptoms. Pt states overall good compliance with treatment and medications, good tolerability, and has been trying to follow appropriate diet.  Pt denies worsening depressive symptoms, suicidal ideation or panic. No fever, night sweats, wt loss, loss of appetite, or other constitutional symptoms.  Pt states good ability with ADL's, has low fall risk, home safety reviewed and adequate, no other significant changes in hearing or vision, is able to ambulate with assist with walker at home, has a lift chair, and only one fall in the past yr, no injury.  No specific compalints. Denies urinary symptoms such as dysuria, frequency, urgency, flank pain, hematuria or n/v, fever, chills.  Denies worsening reflux, abd pain, dysphagia, n/v, bowel change or blood. Denies worsening depressive symptoms, suicidal ideation, or panic  Cont's to declines pneumovax Past Medical History  Diagnosis Date  . ADENOCARCINOMA, PROSTATE, GLEASON GRADE 5 01/21/2009  . ALLERGIC RHINITIS 01/14/2007  . COLONIC POLYPS, HX OF 01/14/2007  . ELEVATED PROSTATE SPECIFIC ANTIGEN 03/27/2008  . ESOPHAGITIS 01/14/2007  . HYPERLIPIDEMIA 01/14/2007  . HYPERTENSION 01/14/2007  . LIBIDO, DECREASED 01/15/2007  . Hypertension   . Hypercholesterolemia    Past Surgical History  Procedure Laterality Date  . Hernia repair    . Prostate cryoablation    . Esophagogastroduodenoscopy N/A 08/15/2013    Procedure: ESOPHAGOGASTRODUODENOSCOPY (EGD);  Surgeon: Gwenyth Ober, MD;  Location[facility] ENDOSCOPY;  Service: General;  Laterality: N/A;  . Peg placement N/A  09/02/2013    Procedure: PERCUTANEOUS ENDOSCOPIC GASTROSTOMY (PEG) PLACEMENT;  Surgeon: Gwenyth Ober, MD;  Location: [place];  Service: General;  Laterality: N/A;    reports that he has quit smoking. He does not have any smokeless tobacco history on file. He reports that he does not drink alcohol or use illicit drugs. family history includes Hypertension in his other; Lung cancer in his father. No Known Allergies Current Outpatient Prescriptions on File Prior to Visit  Medication Sig Dispense Refill  . baclofen (LIORESAL) 10 MG tablet Take 1 tablet (10 mg total) by mouth 4 (four) times daily. 120 each 3  . diclofenac sodium (VOLTAREN) 1 % GEL Apply 1 application topically 3 (three) times daily. Left wrist 3 Tube 4  . Multiple Vitamin (MULTIVITAMIN) LIQD Place 5 mLs into feeding tube daily. (Patient taking differently: Take 5 mLs by mouth daily. ) 240 mL 1  . omeprazole (PRILOSEC) 40 MG capsule Take 1 capsule (40 mg total) by mouth daily. 90 capsule 1  . rosuvastatin (CRESTOR) 20 MG tablet Take 1 tablet (20 mg total) by mouth daily. 90 tablet 3  . warfarin (COUMADIN) 5 MG tablet TAKE 1 & 1/2 TABLET BY MOUTH ONCE DAILY EXCEPT 2 TABLETS ON WEDNESDAYAND SATURDAY. 60 tablet 4   No current facility-administered medications on file prior to visit.   Review of Systems Constitutional: Negative for increased diaphoresis, or other activity, appetite or siginficant weight change other than noted HENT: Negative for worsening hearing loss, ear pain, facial swelling, mouth sores and neck stiffness.   Eyes: Negative for other worsening pain, redness or visual disturbance.  Respiratory: Negative for choking  or stridor Cardiovascular: Negative for other chest pain and palpitations.  Gastrointestinal: Negative for worsening diarrhea, blood in stool, or abdominal distention Genitourinary: Negative for hematuria, flank pain or change in urine volume.  Musculoskeletal: Negative for myalgias or other joint  complaints.  Skin: Negative for other color change and wound or drainage.  Neurological: Negative for syncope and numbness. other than noted Hematological: Negative for adenopathy. or other swelling Psychiatric/Behavioral: Negative for hallucinations, SI, self-injury, decreased concentration or other worsening agitation.      Objective:   Physical Exam BP 138/80 mmHg  Pulse 74  Temp(Src) 98.2 F (36.8 C) (Oral)  Resp 20  Wt 191 lb (86.637 kg)  SpO2 96% VS noted,  Constitutional: [name] oriented to person, place, and time. Appears well-developed and well-nourished, in no significant distress Head: Normocephalic and atraumatic  Eyes: Conjunctivae and EOM are normal. Pupils are equal, round, and reactive to light Right Ear: External ear normal.  Left Ear: External ear normal Nose: Nose normal.  Mouth/Throat: Oropharynx is clear and moist  Neck: Normal range of motion. Neck supple. No JVD present. No tracheal deviation present or significant neck LA or mass Cardiovascular: Normal rate, regular rhythm, normal heart sounds and intact distal pulses.   Pulmonary/Chest: Effort normal and breath sounds without rales or wheezing  Abdominal: Soft. Bowel sounds are normal. NT. No HSM  Musculoskeletal: Normal range of motion. Exhibits trace right ankle edema only, has chronic distal LUE contracture in brace Lymphadenopathy: Has no cervical adenopathy.  Neurological: [name] alert and oriented to person, place, and time. Pt has normal reflexes. No cranial nerve deficit. Motor grossly intact Skin: Skin is warm and dry. No rash noted or new ulcers Psychiatric:  Has mostly flat mood and affect. Behavior is normal. No agitation, irritability.     Assessment & Plan:

## 2015-04-30 NOTE — Assessment & Plan Note (Addendum)
Mild, for cont;d leg elevation with sitting,  to f/u any worsening symptoms or concerns

## 2015-04-30 NOTE — Assessment & Plan Note (Signed)
stable overall by history and exam, recent data reviewed with pt, and pt to continue medical treatment as before,  to f/u any worsening symptoms or concerns BP Readings from Last 3 Encounters:  04/30/15 138/80  03/22/15 133/71  03/02/15 132/67

## 2015-04-30 NOTE — Assessment & Plan Note (Signed)
stable overall by history and exam, recent data reviewed with pt, and pt to continue medical treatment as before,  to f/u any worsening symptoms or concerns Lab Results  Component Value Date   LDLCALC 110* 04/03/2013

## 2015-04-30 NOTE — Progress Notes (Signed)
Pre visit review using our clinic review tool, if applicable. No additional management support is needed unless otherwise documented below in the visit note. 

## 2015-04-30 NOTE — Assessment & Plan Note (Addendum)
stable overall by history and exam, recent data reviewed with pt, and pt to continue medical treatment as before,  to f/u any worsening symptoms or concerns Lab Results  Component Value Date   CREATININE 1.61* 06/21/2014   For f/u labs

## 2015-05-01 ENCOUNTER — Encounter: Payer: Self-pay | Admitting: Internal Medicine

## 2015-05-08 ENCOUNTER — Other Ambulatory Visit: Payer: Self-pay | Admitting: Physical Medicine & Rehabilitation

## 2015-05-12 ENCOUNTER — Ambulatory Visit (INDEPENDENT_AMBULATORY_CARE_PROVIDER_SITE_OTHER): Payer: Medicare Other | Admitting: *Deleted

## 2015-05-12 DIAGNOSIS — Z7901 Long term (current) use of anticoagulants: Secondary | ICD-10-CM

## 2015-05-12 DIAGNOSIS — I480 Paroxysmal atrial fibrillation: Secondary | ICD-10-CM

## 2015-05-12 LAB — POCT INR: INR: 2.7

## 2015-06-07 ENCOUNTER — Other Ambulatory Visit: Payer: Self-pay | Admitting: Physical Medicine & Rehabilitation

## 2015-06-07 ENCOUNTER — Other Ambulatory Visit: Payer: Self-pay | Admitting: Internal Medicine

## 2015-06-16 ENCOUNTER — Encounter: Payer: Medicare Other | Attending: Physical Medicine & Rehabilitation | Admitting: Physical Medicine & Rehabilitation

## 2015-06-16 ENCOUNTER — Encounter: Payer: Self-pay | Admitting: Physical Medicine & Rehabilitation

## 2015-06-16 VITALS — BP 134/56 | HR 81

## 2015-06-16 DIAGNOSIS — R2689 Other abnormalities of gait and mobility: Secondary | ICD-10-CM | POA: Insufficient documentation

## 2015-06-16 DIAGNOSIS — Z789 Other specified health status: Secondary | ICD-10-CM | POA: Diagnosis not present

## 2015-06-16 DIAGNOSIS — M5382 Other specified dorsopathies, cervical region: Secondary | ICD-10-CM | POA: Diagnosis not present

## 2015-06-16 DIAGNOSIS — R29898 Other symptoms and signs involving the musculoskeletal system: Secondary | ICD-10-CM | POA: Diagnosis not present

## 2015-06-16 DIAGNOSIS — R531 Weakness: Secondary | ICD-10-CM | POA: Insufficient documentation

## 2015-06-16 DIAGNOSIS — M436 Torticollis: Secondary | ICD-10-CM | POA: Diagnosis not present

## 2015-06-16 DIAGNOSIS — G8114 Spastic hemiplegia affecting left nondominant side: Secondary | ICD-10-CM

## 2015-06-16 DIAGNOSIS — S069X4S Unspecified intracranial injury with loss of consciousness of 6 hours to 24 hours, sequela: Secondary | ICD-10-CM | POA: Diagnosis not present

## 2015-06-16 DIAGNOSIS — M654 Radial styloid tenosynovitis [de Quervain]: Secondary | ICD-10-CM | POA: Diagnosis not present
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
## 2015-06-16 NOTE — Patient Instructions (Signed)
BACLOFEN:  11-01-08-20MG FOR 5 DAYS 20-10-10-20MG FOR 5 DAYS THEN 20-20-10-20 MG FOR 5 DAYS THEN 20MG 4X DAILY.   PLEASE CALL ME WITH ANY PROBLEMS OR QUESTIONS (#[phone]).

## 2015-06-23 ENCOUNTER — Ambulatory Visit (INDEPENDENT_AMBULATORY_CARE_PROVIDER_SITE_OTHER): Payer: Medicare Other | Admitting: *Deleted

## 2015-06-23 DIAGNOSIS — I480 Paroxysmal atrial fibrillation: Secondary | ICD-10-CM

## 2015-06-23 DIAGNOSIS — Z7901 Long term (current) use of anticoagulants: Secondary | ICD-10-CM

## 2015-06-23 LAB — POCT INR: INR: 3.6

## 2015-06-30 ENCOUNTER — Other Ambulatory Visit: Payer: Self-pay | Admitting: Internal Medicine

## 2015-06-30 ENCOUNTER — Other Ambulatory Visit: Payer: Self-pay | Admitting: Physical Medicine & Rehabilitation

## 2015-07-05 ENCOUNTER — Ambulatory Visit (INDEPENDENT_AMBULATORY_CARE_PROVIDER_SITE_OTHER): Payer: Medicare Other | Admitting: *Deleted

## 2015-07-05 DIAGNOSIS — I480 Paroxysmal atrial fibrillation: Secondary | ICD-10-CM

## 2015-07-05 DIAGNOSIS — Z7901 Long term (current) use of anticoagulants: Secondary | ICD-10-CM | POA: Diagnosis not present

## 2015-07-05 LAB — POCT INR: INR: 2

## 2015-07-13 ENCOUNTER — Encounter: Payer: Medicare Other | Attending: Physical Medicine & Rehabilitation | Admitting: Physical Medicine & Rehabilitation

## 2015-07-13 ENCOUNTER — Encounter: Payer: Self-pay | Admitting: Physical Medicine & Rehabilitation

## 2015-07-13 VITALS — BP 134/74 | HR 77 | Resp 14

## 2015-07-13 DIAGNOSIS — M654 Radial styloid tenosynovitis [de Quervain]: Secondary | ICD-10-CM

## 2015-07-13 DIAGNOSIS — M436 Torticollis: Secondary | ICD-10-CM | POA: Insufficient documentation

## 2015-07-13 DIAGNOSIS — G8114 Spastic hemiplegia affecting left nondominant side: Secondary | ICD-10-CM | POA: Diagnosis not present

## 2015-07-13 DIAGNOSIS — R531 Weakness: Secondary | ICD-10-CM | POA: Insufficient documentation

## 2015-07-13 DIAGNOSIS — Z789 Other specified health status: Secondary | ICD-10-CM | POA: Insufficient documentation

## 2015-07-13 DIAGNOSIS — M7502 Adhesive capsulitis of left shoulder: Secondary | ICD-10-CM | POA: Diagnosis not present

## 2015-07-13 DIAGNOSIS — M5382 Other specified dorsopathies, cervical region: Secondary | ICD-10-CM | POA: Diagnosis not present

## 2015-07-13 DIAGNOSIS — R29898 Other symptoms and signs involving the musculoskeletal system: Secondary | ICD-10-CM | POA: Diagnosis not present

## 2015-07-13 DIAGNOSIS — R2689 Other abnormalities of gait and mobility: Secondary | ICD-10-CM | POA: Diagnosis not present

## 2015-07-13 NOTE — Patient Instructions (Signed)
PLEASE CALL ME WITH ANY PROBLEMS OR QUESTIONS ([phone])  
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
## 2015-07-19 ENCOUNTER — Telehealth: Payer: Self-pay | Admitting: Physical Medicine & Rehabilitation

## 2015-07-19 NOTE — Telephone Encounter (Signed)
Austin [name] patients wife is calling about an infection that he has on his left hand.  If someone could please call her back.

## 2015-07-19 NOTE — Telephone Encounter (Signed)
Wife bathed the lesion with antibiotic soap and applied neosporin but no bandage. Not swollen. Gardiner Rhyme to continue what she is doing. Making an appointment with Zella Ball so you can drop in and take a look.

## 2015-07-21 ENCOUNTER — Encounter (HOSPITAL_BASED_OUTPATIENT_CLINIC_OR_DEPARTMENT_OTHER): Payer: Medicare Other | Admitting: Registered Nurse

## 2015-07-21 ENCOUNTER — Encounter: Payer: Self-pay | Admitting: Registered Nurse

## 2015-07-21 VITALS — BP 125/67 | HR 91 | Resp 16

## 2015-07-21 DIAGNOSIS — S069X4S Unspecified intracranial injury with loss of consciousness of 6 hours to 24 hours, sequela: Secondary | ICD-10-CM

## 2015-07-21 DIAGNOSIS — R531 Weakness: Secondary | ICD-10-CM | POA: Diagnosis not present

## 2015-07-21 DIAGNOSIS — R29898 Other symptoms and signs involving the musculoskeletal system: Secondary | ICD-10-CM | POA: Diagnosis not present

## 2015-07-21 DIAGNOSIS — G8114 Spastic hemiplegia affecting left nondominant side: Secondary | ICD-10-CM

## 2015-07-21 DIAGNOSIS — M7502 Adhesive capsulitis of left shoulder: Secondary | ICD-10-CM

## 2015-07-21 DIAGNOSIS — M5382 Other specified dorsopathies, cervical region: Secondary | ICD-10-CM | POA: Diagnosis not present

## 2015-07-21 DIAGNOSIS — Z789 Other specified health status: Secondary | ICD-10-CM | POA: Diagnosis not present

## 2015-07-21 DIAGNOSIS — M436 Torticollis: Secondary | ICD-10-CM | POA: Diagnosis not present

## 2015-07-21 DIAGNOSIS — R2689 Other abnormalities of gait and mobility: Secondary | ICD-10-CM | POA: Diagnosis not present
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
## 2015-07-24 ENCOUNTER — Other Ambulatory Visit: Payer: Self-pay | Admitting: Physical Medicine & Rehabilitation

## 2015-07-26 ENCOUNTER — Ambulatory Visit (INDEPENDENT_AMBULATORY_CARE_PROVIDER_SITE_OTHER): Payer: Medicare Other | Admitting: *Deleted

## 2015-07-26 DIAGNOSIS — I480 Paroxysmal atrial fibrillation: Secondary | ICD-10-CM

## 2015-07-26 DIAGNOSIS — Z7901 Long term (current) use of anticoagulants: Secondary | ICD-10-CM

## 2015-07-26 LAB — POCT INR: INR: 2.5

## 2015-07-28 DIAGNOSIS — Z87820 Personal history of traumatic brain injury: Secondary | ICD-10-CM | POA: Diagnosis not present

## 2015-07-28 DIAGNOSIS — N281 Cyst of kidney, acquired: Secondary | ICD-10-CM | POA: Diagnosis not present

## 2015-07-28 DIAGNOSIS — N5201 Erectile dysfunction due to arterial insufficiency: Secondary | ICD-10-CM | POA: Diagnosis not present

## 2015-07-28 DIAGNOSIS — C61 Malignant neoplasm of prostate: Secondary | ICD-10-CM | POA: Diagnosis not present

## 2015-07-30 DIAGNOSIS — H5340 Unspecified visual field defects: Secondary | ICD-10-CM | POA: Diagnosis not present

## 2015-07-30 DIAGNOSIS — H43813 Vitreous degeneration, bilateral: Secondary | ICD-10-CM | POA: Diagnosis not present

## 2015-07-30 DIAGNOSIS — Z961 Presence of intraocular lens: Secondary | ICD-10-CM | POA: Diagnosis not present

## 2015-07-30 DIAGNOSIS — H4720 Unspecified optic atrophy: Secondary | ICD-10-CM | POA: Diagnosis not present

## 2015-08-15 ENCOUNTER — Other Ambulatory Visit: Payer: Self-pay | Admitting: Physical Medicine & Rehabilitation

## 2015-08-17 ENCOUNTER — Telehealth: Payer: Self-pay | Admitting: *Deleted

## 2015-08-17 NOTE — Telephone Encounter (Signed)
error 

## 2015-08-18 NOTE — Telephone Encounter (Signed)
I spoke with Austin [name] and verified Austin [name] is taking his meds by mouth and not by peg tube currently.

## 2015-08-23 ENCOUNTER — Ambulatory Visit (INDEPENDENT_AMBULATORY_CARE_PROVIDER_SITE_OTHER): Payer: Medicare Other | Admitting: *Deleted

## 2015-08-23 DIAGNOSIS — Z7901 Long term (current) use of anticoagulants: Secondary | ICD-10-CM | POA: Diagnosis not present

## 2015-08-23 DIAGNOSIS — I480 Paroxysmal atrial fibrillation: Secondary | ICD-10-CM

## 2015-08-23 LAB — POCT INR: INR: 3.6

## 2015-08-30 ENCOUNTER — Ambulatory Visit (INDEPENDENT_AMBULATORY_CARE_PROVIDER_SITE_OTHER): Payer: Medicare Other | Admitting: *Deleted

## 2015-08-30 DIAGNOSIS — I480 Paroxysmal atrial fibrillation: Secondary | ICD-10-CM | POA: Diagnosis not present

## 2015-08-30 DIAGNOSIS — Z7901 Long term (current) use of anticoagulants: Secondary | ICD-10-CM

## 2015-08-30 LAB — POCT INR: INR: 2.4

## 2015-09-07 ENCOUNTER — Telehealth: Payer: Self-pay | Admitting: Physical Medicine & Rehabilitation

## 2015-09-07 ENCOUNTER — Other Ambulatory Visit: Payer: Self-pay | Admitting: Internal Medicine

## 2015-09-07 MED ORDER — BACLOFEN 10 MG PO TABS: 20.0000 mg | ORAL_TABLET | Freq: Three times a day (TID) | ORAL | 3 refills | Status: DC

## 2015-09-07 NOTE — Telephone Encounter (Signed)
Reordered. Naaman Plummer documented 20 mg tid. And Mrs Thorstenson notified

## 2015-09-07 NOTE — Telephone Encounter (Signed)
Patient is out of Baclofen.  He was told at a visit with Dr. Naaman Plummer his medicdation was changed to taking 2 pills a day 3 x day.  He will need a refill per his wife.  Please call her at [phone].

## 2015-09-15 ENCOUNTER — Other Ambulatory Visit: Payer: Self-pay | Admitting: Internal Medicine

## 2015-09-15 NOTE — Telephone Encounter (Signed)
Please refill. Thanks!

## 2015-09-20 ENCOUNTER — Ambulatory Visit (INDEPENDENT_AMBULATORY_CARE_PROVIDER_SITE_OTHER): Payer: Medicare Other | Admitting: *Deleted

## 2015-09-20 DIAGNOSIS — I480 Paroxysmal atrial fibrillation: Secondary | ICD-10-CM

## 2015-09-20 DIAGNOSIS — Z7901 Long term (current) use of anticoagulants: Secondary | ICD-10-CM

## 2015-09-20 LAB — POCT INR: INR: 3.2

## 2015-10-11 ENCOUNTER — Ambulatory Visit (INDEPENDENT_AMBULATORY_CARE_PROVIDER_SITE_OTHER): Payer: Medicare Other | Admitting: *Deleted

## 2015-10-11 DIAGNOSIS — Z7901 Long term (current) use of anticoagulants: Secondary | ICD-10-CM | POA: Diagnosis not present

## 2015-10-11 DIAGNOSIS — I480 Paroxysmal atrial fibrillation: Secondary | ICD-10-CM | POA: Diagnosis not present

## 2015-10-11 LAB — POCT INR: INR: 2.7

## 2015-10-13 ENCOUNTER — Encounter: Payer: Self-pay | Admitting: Physical Medicine & Rehabilitation

## 2015-10-13 ENCOUNTER — Encounter: Payer: Medicare Other | Attending: Physical Medicine & Rehabilitation | Admitting: Physical Medicine & Rehabilitation

## 2015-10-13 VITALS — BP 125/78 | HR 64 | Resp 16

## 2015-10-13 DIAGNOSIS — Z87891 Personal history of nicotine dependence: Secondary | ICD-10-CM | POA: Diagnosis not present

## 2015-10-13 DIAGNOSIS — M19032 Primary osteoarthritis, left wrist: Secondary | ICD-10-CM | POA: Diagnosis not present

## 2015-10-13 DIAGNOSIS — Z87820 Personal history of traumatic brain injury: Secondary | ICD-10-CM | POA: Diagnosis not present

## 2015-10-13 DIAGNOSIS — M25532 Pain in left wrist: Secondary | ICD-10-CM | POA: Diagnosis not present

## 2015-10-13 DIAGNOSIS — Z7901 Long term (current) use of anticoagulants: Secondary | ICD-10-CM | POA: Insufficient documentation

## 2015-10-13 DIAGNOSIS — E785 Hyperlipidemia, unspecified: Secondary | ICD-10-CM | POA: Diagnosis not present

## 2015-10-13 DIAGNOSIS — M19072 Primary osteoarthritis, left ankle and foot: Secondary | ICD-10-CM | POA: Diagnosis not present

## 2015-10-13 DIAGNOSIS — Z8546 Personal history of malignant neoplasm of prostate: Secondary | ICD-10-CM | POA: Diagnosis not present

## 2015-10-13 DIAGNOSIS — I129 Hypertensive chronic kidney disease with stage 1 through stage 4 chronic kidney disease, or unspecified chronic kidney disease: Secondary | ICD-10-CM | POA: Diagnosis not present

## 2015-10-13 DIAGNOSIS — M79645 Pain in left finger(s): Secondary | ICD-10-CM | POA: Insufficient documentation

## 2015-10-13 DIAGNOSIS — N183 Chronic kidney disease, stage 3 (moderate): Secondary | ICD-10-CM | POA: Diagnosis not present

## 2015-10-13 DIAGNOSIS — M654 Radial styloid tenosynovitis [de Quervain]: Secondary | ICD-10-CM

## 2015-10-13 DIAGNOSIS — M25571 Pain in right ankle and joints of right foot: Secondary | ICD-10-CM | POA: Diagnosis not present

## 2015-10-13 DIAGNOSIS — E7800 Pure hypercholesterolemia, unspecified: Secondary | ICD-10-CM | POA: Insufficient documentation

## 2015-10-13 DIAGNOSIS — G8114 Spastic hemiplegia affecting left nondominant side: Secondary | ICD-10-CM | POA: Diagnosis not present
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
## 2015-10-13 NOTE — Patient Instructions (Signed)
YOU CAN TAKE 650MG OF TYLENOL WITH BREAKFAST AND AT BEDTIME   ALSO, VOLTAREN GEL TO RIGHT ANKLE AND LEFT KNEE   CONTINUE TO WORK ON YOUR FORM AND MECHANICS, STRETCH AS MUCH AS YOU CAN.    PLEASE CALL ME WITH ANY PROBLEMS OR QUESTIONS [phone])

## 2015-11-08 ENCOUNTER — Ambulatory Visit (INDEPENDENT_AMBULATORY_CARE_PROVIDER_SITE_OTHER): Payer: Medicare Other | Admitting: *Deleted

## 2015-11-08 DIAGNOSIS — I480 Paroxysmal atrial fibrillation: Secondary | ICD-10-CM | POA: Diagnosis not present

## 2015-11-08 DIAGNOSIS — Z7901 Long term (current) use of anticoagulants: Secondary | ICD-10-CM | POA: Diagnosis not present

## 2015-11-08 LAB — POCT INR: INR: 2.6

## 2015-11-18 ENCOUNTER — Ambulatory Visit (INDEPENDENT_AMBULATORY_CARE_PROVIDER_SITE_OTHER): Payer: Medicare Other | Admitting: Internal Medicine

## 2015-11-18 ENCOUNTER — Encounter: Payer: Self-pay | Admitting: Internal Medicine

## 2015-11-18 VITALS — BP 132/84 | HR 69 | Temp 98.2°F

## 2015-11-18 DIAGNOSIS — N183 Chronic kidney disease, stage 3 unspecified: Secondary | ICD-10-CM

## 2015-11-18 DIAGNOSIS — M25511 Pain in right shoulder: Secondary | ICD-10-CM | POA: Insufficient documentation

## 2015-11-18 DIAGNOSIS — E785 Hyperlipidemia, unspecified: Secondary | ICD-10-CM | POA: Diagnosis not present

## 2015-11-18 DIAGNOSIS — I10 Essential (primary) hypertension: Secondary | ICD-10-CM | POA: Diagnosis not present

## 2015-11-18 MED ORDER — ROSUVASTATIN CALCIUM 20 MG PO TABS: 20.0000 mg | ORAL_TABLET | Freq: Every day | ORAL | 1 refills | Status: DC

## 2015-11-18 MED ORDER — OMEPRAZOLE 40 MG PO CPDR: 40.0000 mg | DELAYED_RELEASE_CAPSULE | Freq: Every day | ORAL | 1 refills | Status: DC

## 2015-11-18 MED ORDER — LISINOPRIL-HYDROCHLOROTHIAZIDE 10-12.5 MG PO TABS: 1.0000 | ORAL_TABLET | Freq: Every day | ORAL | 1 refills | Status: DC

## 2015-11-18 NOTE — Progress Notes (Signed)
Pre visit review using our clinic review tool, if applicable. No additional management support is needed unless otherwise documented below in the visit note. 

## 2015-11-18 NOTE — Progress Notes (Signed)
Subjective:    Patient ID: Austin [name], male    DOB: 08-01-1932, 82 y.o.   MRN: [id number]  HPI  Here to f/u; overall doing ok,  Pt denies chest pain, increasing sob or doe, wheezing, orthopnea, PND, increased LE swelling, palpitations, dizziness or syncope.  Pt denies new neurological symptoms such as new headache, or facial or extremity weakness or numbness.  Pt denies polydipsia, polyuria, or low sugar episode.   Pt denies new neurological symptoms such as new headache, or facial or extremity weakness or numbness.   Pt states overall good compliance with meds, mostly trying to follow appropriate diet, with wt overall up several lbs. Planning to try to be more active.  Limited recently due to 1 mo onset right shoulder pain, mild, intermitent, no neck or other arm pain, has some pain to movement, better to rest BP Readings from Last 3 Encounters:  11/18/15 132/84  10/13/15 125/78  07/21/15 125/67   Wt Readings from Last 3 Encounters:  04/30/15 191 lb (86.6 kg)  06/21/14 182 lb (82.6 kg)  05/06/14 178 lb (80.7 kg)   Past Medical History:  Diagnosis Date  . ADENOCARCINOMA, PROSTATE, GLEASON GRADE 5 01/21/2009  . ALLERGIC RHINITIS 01/14/2007  . CKD (chronic kidney disease) stage 3, GFR 30-59 ml/min 04/30/2015  . COLONIC POLYPS, HX OF 01/14/2007  . ELEVATED PROSTATE SPECIFIC ANTIGEN 03/27/2008  . ESOPHAGITIS 01/14/2007  . Hypercholesterolemia   . HYPERLIPIDEMIA 01/14/2007  . HYPERTENSION 01/14/2007  . Hypertension   . LIBIDO, DECREASED 01/15/2007   Past Surgical History:  Procedure Laterality Date  . ESOPHAGOGASTRODUODENOSCOPY N/A 08/15/2013   Procedure: ESOPHAGOGASTRODUODENOSCOPY (EGD);  Surgeon: Gwenyth Ober, MD;  Location: [place];  Service: General;  Laterality: N/A;  . HERNIA REPAIR    . PEG PLACEMENT N/A 09/02/2013   Procedure: PERCUTANEOUS ENDOSCOPIC GASTROSTOMY (PEG) PLACEMENT;  Surgeon: Gwenyth Ober, MD;  Location: [place];  Service: General;  Laterality: N/A;    . PROSTATE CRYOABLATION      reports that he has quit smoking. He does not have any smokeless tobacco history on file. He reports that he does not drink alcohol or use drugs. family history includes Hypertension in his other; Lung cancer in his father. No Known Allergies Current Outpatient Prescriptions on File Prior to Visit  Medication Sig Dispense Refill  . baclofen (LIORESAL) 10 MG tablet Take 2 tablets (20 mg total) by mouth 3 (three) times daily. 180 tablet 3  . diclofenac sodium (VOLTAREN) 1 % GEL Apply 1 application topically 3 (three) times daily. Left wrist 3 Tube 4  . Multiple Vitamin (MULTIVITAMIN) LIQD Place 5 mLs into feeding tube daily. (Patient taking differently: Take 5 mLs by mouth daily. ) 240 mL 1  . warfarin (COUMADIN) 5 MG tablet Take 1 1/2 tablets daily except 2 tablets on Monday 60 tablet 3   No current facility-administered medications on file prior to visit.    Review of Systems  Constitutional: Negative for unusual diaphoresis or night sweats HENT: Negative for ear swelling or discharge Eyes: Negative for worsening visual haziness  Respiratory: Negative for choking and stridor.   Gastrointestinal: Negative for distension or worsening eructation Genitourinary: Negative for retention or change in urine volume.  Musculoskeletal: Negative for other MSK pain or swelling Skin: Negative for color change and worsening wound Neurological: Negative for tremors and numbness other than noted  Psychiatric/Behavioral: Negative for decreased concentration or agitation other than above   All other system [place]    Objective:  Physical Exam BP 132/84 (BP Location: Right Arm)   Pulse 69   Temp 98.2 F (36.8 C) (Oral)   SpO2 97%  VS noted,  Constitutional: Pt appears in no apparent distress HENT: Head: NCAT.  Right Ear: External ear normal.  Left Ear: External ear normal.  Eyes: . Pupils are equal, round, and reactive to light. Conjunctivae and EOM are normal Neck:  Normal range of motion. Neck supple.  Cardiovascular: Normal rate and regular rhythm.   Pulmonary/Chest: Effort normal and breath sounds without rales or wheezing.  Right shoulder NT but with reduced ROm and pain to forward elevation and abduction Neurological: [name] alert. Not confused , motor grossly intact Skin: Skin is warm. No rash, no LE edema Psychiatric: Pt behavior is normal. No agitation.  No there signfiicant exam changes    Assessment & Plan:

## 2015-11-18 NOTE — Patient Instructions (Signed)
Please continue all other medications as before, and refills have been done if requested.  Please have the pharmacy call with any other refills you may need.  Please continue your efforts at being more active, low cholesterol diet, and weight control.  You are otherwise up to date with prevention measures today.  Please keep your appointments with your specialists as you may have planned  You will be contacted regarding the referral for: Dr Tamala Julian for the right shoulder (although you can make an appt as you leave today)  Please return in 6 months, or sooner if needed

## 2015-11-20 NOTE — Assessment & Plan Note (Signed)
Etiology unclear, for sport med referral,  to f/u any worsening symptoms or concerns

## 2015-11-20 NOTE — Assessment & Plan Note (Signed)
stable overall by history and exam, recent data reviewed with pt, and pt to continue medical treatment as before,  to f/u any worsening symptoms or concerns Lab Results  Component Value Date   CREATININE 1.53 (H) 04/30/2015

## 2015-11-20 NOTE — Assessment & Plan Note (Signed)
stable overall by history and exam, recent data reviewed with pt, and pt to continue medical treatment as before,  to f/u any worsening symptoms or concerns BP Readings from Last 3 Encounters:  11/18/15 132/84  10/13/15 125/78  07/21/15 125/67

## 2015-11-20 NOTE — Assessment & Plan Note (Signed)
stable overall by history and exam, recent data reviewed with pt, and pt to continue medical treatment as before,  to f/u any worsening symptoms or concerns Lab Results  Component Value Date   LDLCALC 110 (H) 04/03/2013

## 2015-11-30 NOTE — Progress Notes (Signed)
[name] [address] [place], [place] [address] Phone: [phone] Subjective:    I'm seeing this patient by the request  of:  Cathlean Cower, MD   CC: Right shoulder pain  JSE:[id number]  Austin [name] is a 82 y.o. male coming in with complaint of right shoulder pain patient is had this pain for 6-8 weeks. Describes it as a mild, intermittent pain. No significant association with any neck pain. Seems worse with certain movements. Better with rest. Has tried some over-the-counter medications with minimal improvement. Patient states Starting affect some daily activities such as dressing. Patient states it is keeping him up at night. Denies any weakness. Denies any radiation down the arm.     Past Medical History:  Diagnosis Date  . ADENOCARCINOMA, PROSTATE, GLEASON GRADE 5 01/21/2009  . ALLERGIC RHINITIS 01/14/2007  . CKD (chronic kidney disease) stage 3, GFR 30-59 ml/min 04/30/2015  . COLONIC POLYPS, HX OF 01/14/2007  . ELEVATED PROSTATE SPECIFIC ANTIGEN 03/27/2008  . ESOPHAGITIS 01/14/2007  . Hypercholesterolemia   . HYPERLIPIDEMIA 01/14/2007  . HYPERTENSION 01/14/2007  . Hypertension   . LIBIDO, DECREASED 01/15/2007   Past Surgical History:  Procedure Laterality Date  . ESOPHAGOGASTRODUODENOSCOPY N/A 08/15/2013   Procedure: ESOPHAGOGASTRODUODENOSCOPY (EGD);  Surgeon: Gwenyth Ober, MD;  Location: [facility];  Service: General;  Laterality: N/A;  . HERNIA REPAIR    . PEG PLACEMENT N/A 09/02/2013   Procedure: PERCUTANEOUS ENDOSCOPIC GASTROSTOMY (PEG) PLACEMENT;  Surgeon: Gwenyth Ober, MD;  Location: [facility];  Service: General;  Laterality: N/A;  . PROSTATE CRYOABLATION     Social History   Social History  . Marital status: Married    Spouse name: N/A  . Number of children: 3  . Years of education: 26   Occupational History  . [employer]ician [employer]   Social History Main Topics  . Smoking status: Former Research scientist (life sciences)  .  Smokeless tobacco: None  . Alcohol use No  . Drug use: No  . Sexual activity: Not Asked   Other Topics Concern  . 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 Working full time. Discussion needed in regard to Advance Care Planning-DNR/DNI, no artificial feeding or hydration, No HD, no heroic or futile.                No Known Allergies Family History  Problem Relation Age of Onset  . Lung cancer Father   . Hypertension Other   . Arthritis      Past medical history, social, surgical and family history all reviewed in electronic medical record.  No pertanent information unless stated regarding to the chief complaint.   Review of Systems:Review of systems updated and as accurate as of 12/01/15  No headache, visual changes, nausea, vomiting, diarrhea, constipation, dizziness, abdominal pain, skin rash, fevers, chills, night sweats, weight loss, swollen lymph nodes, body aches, joint swelling, muscle aches, chest pain, shortness of breath, mood changes.   Objective  Blood pressure 122/72, pulse 73, SpO2 96 %. Systems examined below as of 12/01/15   General: No apparent distress alert and oriented x3 mood and affect normal, dressed appropriately. Mild masked feces HEENT: Pupils equal, extraocular movements intact  Respiratory: Patient's speak in full sentences and does not appear short of breath  Cardiovascular: +1  lower extremity edema, non tender, no erythema  Skin: Warm dry intact with no signs of infection or rash on extremities or on axial skeleton.  Abdomen: Soft nontender  Neuro: Cranial nerves II through XII are intact, neurovascularly intact in all extremities Lymph: No lymphadenopathy of posterior or anterior cervical chain or axillae bilaterally.  Gait In a wheelchair MSK:  Patient does have weakness of the lower extremities a 4  out of 5 bilaterally as well as tightness with patient's having weakness of the left upper extremity the patient does not use.  Shoulder: Right Mild atrophy noted Palpation is normal with no tenderness over AC joint or bicipital groove. ROM lacks the last 5 degrees . 4 out of 5 compared to the contralateral side signs of impingement with positive Neer and Hawkin's tests, but negative empty can sign. Speeds and Yergason's tests normal. No labral pathology noted with negative Obrien's, negative clunk and good stability. Normal scapular function observed. No painful arc and no drop arm sign. No apprehension sign Contralateral shoulder unremarkable  MSK US performed of: Right This study was ordered, performed, and interpreted by Charlann Boxer D.O.  Shoulder:   Supraspinatus:  Large degenerative tear noted Infraspinatus:  Appears normal on long and transverse views. Significant increase in Doppler flow Subscapularis:  Large degenerative tear with underlying arthritic changes AC joint:  Severe arthritis. Glenohumeral Joint:  Narrowing of the glenohumeral joint noted Glenoid Labrum:  Intact without visualized tears. Biceps Tendon:  Appears normal on long and transverse views, no fraying of tendon, tendon located in intertubercular groove, no subluxation with shoulder internal or external rotation.  Impression: Rotator cuff arthropathy  Procedure: Real-time Ultrasound Guided Injection of right glenohumeral joint Device: GE Logiq E  Ultrasound guided injection is preferred based studies that show increased duration, increased effect, greater accuracy, decreased procedural pain, increased response rate with ultrasound guided versus blind injection.  Verbal informed consent obtained.  Time-out conducted.  Noted no overlying erythema, induration, or other signs of local infection.  Skin prepped in a sterile fashion.  Local anesthesia: Topical Ethyl chloride.  With sterile technique and under  real time ultrasound guidance:  Joint visualized.  23g 1  inch needle inserted posterior approach. Pictures taken for needle placement. Patient did have injection of 2 cc of 1% lidocaine, 2 cc of 0.5% Marcaine, and 1.0 cc of Kenalog 40 mg/dL. Completed without difficulty  Pain immediately resolved suggesting accurate placement of the medication.  Advised to call if fevers/chills, erythema, induration, drainage, or persistent bleeding.  Images permanently stored and available for review in the ultrasound unit.  Impression: Technically successful ultrasound guided injection.    Impression and Recommendations:     This case required medical decision making of moderate complexity.      Note: This dictation was prepared with Dragon dictation along with smaller phrase technology. Any transcriptional errors that result from this process are unintentional.

## 2015-12-01 ENCOUNTER — Encounter: Payer: Self-pay | Admitting: Family Medicine

## 2015-12-01 ENCOUNTER — Ambulatory Visit (INDEPENDENT_AMBULATORY_CARE_PROVIDER_SITE_OTHER): Payer: Medicare Other | Admitting: Family Medicine

## 2015-12-01 ENCOUNTER — Ambulatory Visit: Payer: Self-pay

## 2015-12-01 VITALS — BP 122/72 | HR 73

## 2015-12-01 DIAGNOSIS — G8929 Other chronic pain: Secondary | ICD-10-CM

## 2015-12-01 DIAGNOSIS — M25511 Pain in right shoulder: Secondary | ICD-10-CM

## 2015-12-01 DIAGNOSIS — M12811 Other specific arthropathies, not elsewhere classified, right shoulder: Secondary | ICD-10-CM | POA: Diagnosis not present

## 2015-12-01 NOTE — Patient Instructions (Addendum)
Good to see you  Ice 20 minutes 2 times daily. Usually after activity and before bed. Exercises 3 times a week.  pennsaid pinkie amount topically 2 times daily as needed.  Avoid any activity where hands are outside of peripheral vision.  Vitamin D 2000 IU daily  Tart cherry extract at night any dose See me again when you need me.

## 2015-12-01 NOTE — Assessment & Plan Note (Signed)
Patient given injection today and tolerated the procedure very well. Patient has a history of a traumatic brain injury with left-sided weakness. Patient has responded fairly well to conservative therapy including injection today. We discussed with patient because of his other comorbidities and would not do any advanced imaging. Patient given trial of topical anti-inflammatory but warned of potential bruising. We discussed over-the-counter medications and which ones to avoid as well. Patient will come back and see me again every 3 months if injection is necessary.

## 2015-12-06 ENCOUNTER — Ambulatory Visit (INDEPENDENT_AMBULATORY_CARE_PROVIDER_SITE_OTHER): Payer: Medicare Other | Admitting: *Deleted

## 2015-12-06 DIAGNOSIS — Z7901 Long term (current) use of anticoagulants: Secondary | ICD-10-CM

## 2015-12-06 DIAGNOSIS — I480 Paroxysmal atrial fibrillation: Secondary | ICD-10-CM

## 2015-12-06 LAB — POCT INR: INR: 2.5

## 2016-01-06 ENCOUNTER — Other Ambulatory Visit: Payer: Self-pay | Admitting: Physical Medicine & Rehabilitation

## 2016-01-12 ENCOUNTER — Ambulatory Visit (INDEPENDENT_AMBULATORY_CARE_PROVIDER_SITE_OTHER): Payer: Medicare Other | Admitting: *Deleted

## 2016-01-12 DIAGNOSIS — I480 Paroxysmal atrial fibrillation: Secondary | ICD-10-CM

## 2016-01-12 DIAGNOSIS — Z7901 Long term (current) use of anticoagulants: Secondary | ICD-10-CM | POA: Diagnosis not present

## 2016-01-12 LAB — POCT INR: INR: 2.6

## 2016-01-31 ENCOUNTER — Telehealth: Payer: Self-pay | Admitting: Internal Medicine

## 2016-01-31 NOTE — Telephone Encounter (Signed)
Called patient to schedule awv. Left msg for patient to call office to schedule appt.

## 2016-02-02 DIAGNOSIS — H4720 Unspecified optic atrophy: Secondary | ICD-10-CM | POA: Diagnosis not present

## 2016-02-02 DIAGNOSIS — H5340 Unspecified visual field defects: Secondary | ICD-10-CM | POA: Diagnosis not present

## 2016-02-09 ENCOUNTER — Encounter: Payer: Medicare Other | Admitting: Physical Medicine & Rehabilitation

## 2016-02-23 ENCOUNTER — Ambulatory Visit (INDEPENDENT_AMBULATORY_CARE_PROVIDER_SITE_OTHER): Payer: Medicare Other | Admitting: *Deleted

## 2016-02-23 DIAGNOSIS — Z7901 Long term (current) use of anticoagulants: Secondary | ICD-10-CM

## 2016-02-23 DIAGNOSIS — I480 Paroxysmal atrial fibrillation: Secondary | ICD-10-CM | POA: Diagnosis not present

## 2016-02-23 LAB — POCT INR: INR: 2.7

## 2016-02-23 MED ORDER — WARFARIN SODIUM 5 MG PO TABS: ORAL_TABLET | ORAL | 4 refills | Status: DC

## 2016-02-28 ENCOUNTER — Encounter: Payer: Medicare Other | Attending: Physical Medicine & Rehabilitation | Admitting: Physical Medicine & Rehabilitation

## 2016-02-28 ENCOUNTER — Encounter: Payer: Self-pay | Admitting: Physical Medicine & Rehabilitation

## 2016-02-28 VITALS — BP 130/64 | HR 98

## 2016-02-28 DIAGNOSIS — Z87891 Personal history of nicotine dependence: Secondary | ICD-10-CM | POA: Insufficient documentation

## 2016-02-28 DIAGNOSIS — M7502 Adhesive capsulitis of left shoulder: Secondary | ICD-10-CM

## 2016-02-28 DIAGNOSIS — Z7901 Long term (current) use of anticoagulants: Secondary | ICD-10-CM | POA: Insufficient documentation

## 2016-02-28 DIAGNOSIS — G8114 Spastic hemiplegia affecting left nondominant side: Secondary | ICD-10-CM

## 2016-02-28 DIAGNOSIS — R1314 Dysphagia, pharyngoesophageal phase: Secondary | ICD-10-CM

## 2016-02-28 DIAGNOSIS — N183 Chronic kidney disease, stage 3 (moderate): Secondary | ICD-10-CM | POA: Insufficient documentation

## 2016-02-28 DIAGNOSIS — M654 Radial styloid tenosynovitis [de Quervain]: Secondary | ICD-10-CM | POA: Insufficient documentation

## 2016-02-28 DIAGNOSIS — Z8546 Personal history of malignant neoplasm of prostate: Secondary | ICD-10-CM | POA: Insufficient documentation

## 2016-02-28 DIAGNOSIS — E7800 Pure hypercholesterolemia, unspecified: Secondary | ICD-10-CM | POA: Insufficient documentation

## 2016-02-28 DIAGNOSIS — M19032 Primary osteoarthritis, left wrist: Secondary | ICD-10-CM | POA: Insufficient documentation

## 2016-02-28 DIAGNOSIS — S069X3S Unspecified intracranial injury with loss of consciousness of 1 hour to 5 hours 59 minutes, sequela: Secondary | ICD-10-CM

## 2016-02-28 DIAGNOSIS — Z87820 Personal history of traumatic brain injury: Secondary | ICD-10-CM | POA: Diagnosis not present

## 2016-02-28 DIAGNOSIS — I129 Hypertensive chronic kidney disease with stage 1 through stage 4 chronic kidney disease, or unspecified chronic kidney disease: Secondary | ICD-10-CM | POA: Diagnosis not present

## 2016-02-28 DIAGNOSIS — M25532 Pain in left wrist: Secondary | ICD-10-CM | POA: Diagnosis not present

## 2016-02-28 MED ORDER — DICLOFENAC SODIUM 1 % TD GEL: 1.0000 "application " | Freq: Three times a day (TID) | TRANSDERMAL | 4 refills | Status: DC

## 2016-02-28 MED ORDER — BACLOFEN 20 MG PO TABS: 20.0000 mg | ORAL_TABLET | Freq: Three times a day (TID) | ORAL | 3 refills | Status: DC
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
## 2016-02-28 NOTE — Patient Instructions (Signed)
PLEASE FEEL FREE TO CALL OUR OFFICE WITH ANY PROBLEMS OR QUESTIONS ([phone])      

## 2016-04-02 NOTE — Progress Notes (Deleted)
[name] [address] [place], [place] [address] Phone: [phone] Subjective:    I'm seeing this patient by the request  of:  Cathlean Cower, MD   CC: Right shoulder pain f/u  GDJ:[id number]  Austin [name] is a 83 y.o. male coming in with complaint of right shoulder pain. Patient was seen for this for months ago and was diagnosed with more of a rotator cuff arthropathy. She was given reviewed direction and discussed conservative therapy. Patient states     Past Medical History:  Diagnosis Date  . ADENOCARCINOMA, PROSTATE, GLEASON GRADE 5 01/21/2009  . ALLERGIC RHINITIS 01/14/2007  . CKD (chronic kidney disease) stage 3, GFR 30-59 ml/min 04/30/2015  . COLONIC POLYPS, HX OF 01/14/2007  . ELEVATED PROSTATE SPECIFIC ANTIGEN 03/27/2008  . ESOPHAGITIS 01/14/2007  . Hypercholesterolemia   . HYPERLIPIDEMIA 01/14/2007  . HYPERTENSION 01/14/2007  . Hypertension   . LIBIDO, DECREASED 01/15/2007   Past Surgical History:  Procedure Laterality Date  . ESOPHAGOGASTRODUODENOSCOPY N/A 08/15/2013   Procedure: ESOPHAGOGASTRODUODENOSCOPY (EGD);  Surgeon: Gwenyth Ober, MD;  Location: [facility];  Service: General;  Laterality: N/A;  . HERNIA REPAIR    . PEG PLACEMENT N/A 09/02/2013   Procedure: PERCUTANEOUS ENDOSCOPIC GASTROSTOMY (PEG) PLACEMENT;  Surgeon: Gwenyth Ober, MD;  Location: [facility];  Service: General;  Laterality: N/A;  . PROSTATE CRYOABLATION     Social History   Social History  . Marital status: Married    Spouse name: N/A  . Number of children: 3  . Years of education: 16   Occupational History  . [employer]ician [employer]   Social History Main Topics  . Smoking status: Former Research scientist (life sciences)  . Smokeless tobacco: Never Used  . Alcohol use No  . Drug use: No  . Sexual activity: Not on file   Other Topics Concern  . Not on file   Social History Narrative   ** Merged History Encounter **       Building services engineer. Married 1961.  2 sons- '63, '69 & 1 daughter '55   Grandchildren 5. 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 Working full time. Discussion needed in regard to Advance Care Planning-DNR/DNI, no artificial feeding or hydration, No HD, no heroic or futile.                No Known Allergies Family History  Problem Relation Age of Onset  . Lung cancer Father   . Hypertension Other   . Arthritis      Past medical history, social, surgical and family history all reviewed in electronic medical record.  No pertanent information unless stated regarding to the chief complaint.   Review of Systems:Review of systems updated and as accurate as of 04/02/16  No headache, visual changes, nausea, vomiting, diarrhea, constipation, dizziness, abdominal pain, skin rash, fevers, chills, night sweats, weight loss, swollen lymph nodes, body aches, joint swelling, muscle aches, chest pain, shortness of breath, mood changes.   Objective  There were no vitals taken for this visit. Systems examined below as of 04/02/16   General: No apparent distress alert and oriented x3 mood and affect normal, dressed appropriately. Mild masked feces HEENT: Pupils equal, extraocular movements intact  Respiratory: Patient's speak in full sentences and does not appear short of breath  Cardiovascular: +1 lower extremity edema, non tender, no erythema  Skin: Warm dry intact with no signs of infection or rash on extremities or on axial skeleton.  Abdomen: Soft nontender  Neuro: Cranial  nerves II through XII are intact, neurovascularly intact in all extremities Lymph: No lymphadenopathy of posterior or anterior cervical chain or axillae bilaterally.  Gait In a wheelchair MSK:  Patient does have weakness of the lower extremities a 4 out of 5 bilaterally as well as tightness with patient's having weakness of the left upper extremity the patient does not use.  Shoulder: Right Mild atrophy noted Palpation is normal with no tenderness over AC joint or  bicipital groove. ROM lacks the last 5 degrees . 4 out of 5 compared to the contralateral side signs of impingement with positive Neer and Hawkin's tests, but negative empty can sign. Speeds and Yergason's tests normal. No labral pathology noted with negative Obrien's, negative clunk and good stability. Normal scapular function observed. No painful arc and no drop arm sign. No apprehension sign Contralateral shoulder unremarkable        Impression and Recommendations:     This case required medical decision making of moderate complexity.      Note: This dictation was prepared with Dragon dictation along with smaller phrase technology. Any transcriptional errors that result from this process are unintentional.

## 2016-04-03 ENCOUNTER — Ambulatory Visit: Payer: Medicare Other | Admitting: Family Medicine

## 2016-04-05 ENCOUNTER — Ambulatory Visit (INDEPENDENT_AMBULATORY_CARE_PROVIDER_SITE_OTHER): Payer: Medicare Other | Admitting: *Deleted

## 2016-04-05 DIAGNOSIS — Z7901 Long term (current) use of anticoagulants: Secondary | ICD-10-CM | POA: Diagnosis not present

## 2016-04-05 DIAGNOSIS — I480 Paroxysmal atrial fibrillation: Secondary | ICD-10-CM | POA: Diagnosis not present

## 2016-04-05 LAB — POCT INR: INR: 3.8

## 2016-04-06 ENCOUNTER — Other Ambulatory Visit: Payer: Self-pay | Admitting: Family Medicine

## 2016-04-06 ENCOUNTER — Ambulatory Visit (INDEPENDENT_AMBULATORY_CARE_PROVIDER_SITE_OTHER)
Admission: RE | Admit: 2016-04-06 | Discharge: 2016-04-06 | Disposition: A | Discharge status: Home / Self Care | Payer: Medicare Other | Source: Ambulatory Visit | Attending: Family Medicine | Admitting: Family Medicine

## 2016-04-06 ENCOUNTER — Ambulatory Visit (INDEPENDENT_AMBULATORY_CARE_PROVIDER_SITE_OTHER): Payer: Medicare Other | Admitting: Family Medicine

## 2016-04-06 ENCOUNTER — Encounter: Payer: Self-pay | Admitting: Family Medicine

## 2016-04-06 VITALS — BP 130/70 | HR 70 | Ht 66.5 in | Wt 186.0 lb

## 2016-04-06 DIAGNOSIS — M545 Low back pain, unspecified: Secondary | ICD-10-CM

## 2016-04-06 DIAGNOSIS — M1612 Unilateral primary osteoarthritis, left hip: Secondary | ICD-10-CM | POA: Diagnosis not present

## 2016-04-06 DIAGNOSIS — M5416 Radiculopathy, lumbar region: Secondary | ICD-10-CM

## 2016-04-06 DIAGNOSIS — G8114 Spastic hemiplegia affecting left nondominant side: Secondary | ICD-10-CM | POA: Diagnosis not present

## 2016-04-06 MED ORDER — PREDNISONE 20 MG PO TABS: 40.0000 mg | ORAL_TABLET | Freq: Every day | ORAL | 0 refills | Status: DC

## 2016-04-06 MED ORDER — GABAPENTIN 100 MG PO CAPS: 200.0000 mg | ORAL_CAPSULE | Freq: Every day | ORAL | 3 refills | Status: DC

## 2016-04-06 NOTE — Assessment & Plan Note (Signed)
We'll continue to monitor closely. Possibly some mild worsening.

## 2016-04-06 NOTE — Patient Instructions (Signed)
Good to see you.  Ice 20 minutes 3 times daily  Prednisone 57m daily for 5 days Gabapentin 2058mat night Xrays downstairs.  See me again in 7-12 days.

## 2016-04-06 NOTE — Progress Notes (Signed)
[name] [address] [place], [place] [address] Phone: [phone] Subjective:    I'm seeing this patient by the request  of:    CC: Left hip pain  [name]  Austin [name] is a 83 y.o. male coming in with complaint of left hip pain. Patient states his been going on for weeks. Patient has noticed more and more weakness in the hip. Patient does have a past medical history significant for left-sided hemiparesis after traumatic brain injury. Patient does need help with transferring at baseline and usually tries to walk with a walker. Patient is concerned because things in his legs have the overall. Denies though any significant groin pain but states that he can and leg pain. Rates the severity of pain a 7 out of 10.     patient did have x-rays taken today. Lumbar spine x-rays show multilevel degenerative disc disease and spondylosis mostly mild to moderate in severity. Patient also had x-rays of both hips that show very mild osteophytic changes.  Past Medical History:  Diagnosis Date  . ADENOCARCINOMA, PROSTATE, GLEASON GRADE 5 01/21/2009  . ALLERGIC RHINITIS 01/14/2007  . CKD (chronic kidney disease) stage 3, GFR 30-59 ml/min 04/30/2015  . COLONIC POLYPS, HX OF 01/14/2007  . ELEVATED PROSTATE SPECIFIC ANTIGEN 03/27/2008  . ESOPHAGITIS 01/14/2007  . Hypercholesterolemia   . HYPERLIPIDEMIA 01/14/2007  . HYPERTENSION 01/14/2007  . Hypertension   . LIBIDO, DECREASED 01/15/2007   Past Surgical History:  Procedure Laterality Date  . ESOPHAGOGASTRODUODENOSCOPY N/A 08/15/2013   Procedure: ESOPHAGOGASTRODUODENOSCOPY (EGD);  Surgeon: Gwenyth Ober, MD;  Location: [facility];  Service: General;  Laterality: N/A;  . HERNIA REPAIR    . PEG PLACEMENT N/A 09/02/2013   Procedure: PERCUTANEOUS ENDOSCOPIC GASTROSTOMY (PEG) PLACEMENT;  Surgeon: Gwenyth Ober, MD;  Location: [facility];  Service: General;  Laterality: N/A;  . PROSTATE CRYOABLATION     Social  History   Social History  . Marital status: Married    Spouse name: N/A  . Number of children: 3  . Years of education: 34   Occupational History  . [employer]   Social History Main Topics  . Smoking status: Former Research scientist (life sciences)  . Smokeless tobacco: Never Used  . Alcohol use No  . Drug use: No  . Sexual activity: Not Asked   Other Topics Concern  . 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 Working full time. Discussion needed in regard to Advance Care Planning-DNR/DNI, no artificial feeding or hydration, No HD, no heroic or futile.                No Known Allergies Family History  Problem Relation Age of Onset  . Lung cancer Father   . Hypertension Other   . Arthritis      Past medical history, social, surgical and family history all reviewed in electronic medical record.  No pertanent information unless stated regarding to the chief complaint.   Review of Systems:Review of systems updated and as accurate as of 04/06/16  No headache, visual changes, nausea, vomiting, diarrhea, constipation, dizziness, abdominal pain, skin rash, fevers, chills, night sweats, weight loss, swollen lymph nodes,joint swelling,chest pain, shortness of breath, mood changes. Positive muscle aches, body aches  Objective  Blood pressure 130/70, pulse 70, height  5' 6.5" (1.689 m), weight 186 lb (84.4 kg). Systems examined below as of 04/06/16   General: No apparent distress alert and oriented x3 mood and affect normal, dressed appropriately.  HEENT: Pupils equal, extraocular movements intact  Respiratory: Patient's speak in full sentences and does not appear short of breath  Cardiovascular: No lower extremity edema, non tender, no erythema  Skin: Warm dry intact with no signs of infection or rash on extremities or on axial skeleton.   Abdomen: Soft nontender  Neuro: Cranial nerves II through XII are intact, neurovascularly intact in all extremities with 2+ pulses. 1+ tendon reflexes in the lower Jimmy's bilaterally. Lymph: No lymphadenopathy of posterior or anterior cervical chain or axillae bilaterally.  Gait Patient is in a wheelchair. MGY:[id number] arthritic changes of multiple joints. Patient does have left-sided hemiparesis secondary to injury. Cogwheeling noted. Patient's left leg shows significant weakness. Comparing from previous exam patient is mildly increased weakness and plantarflexion. She also has weakness in hip flexion compared to the contralateral side. Would put a 3 out of 5 strength. No beats of clonus noted seems to be neurovascularly intact distally    Impression and Recommendations:     This case required medical decision making of moderate complexity.      Note: This dictation was prepared with Dragon dictation along with smaller phrase technology. Any transcriptional errors that result from this process are unintentional.

## 2016-04-06 NOTE — Assessment & Plan Note (Signed)
Believe the patient is having more of a lumbar radiculopathy. Difficult to assess secondary to patient's femur paresis. Patient's x-ray did show degenerative disc disease. Started on a low dose gabapentin to help with nighttime relief as well as started him on a very low dose of prednisone. Warned him that this couldn't contribute to changes and his Coumadin. Differential includes left hip arthritis very mild overall. Occult fracture is within the differential but I no recent falls. Patient does not have any signs or symptoms any recent stroke. Possibly worsening paresis and possibly will need further workup for this as well. Discussed with him and his family at great length and patient will be following up in the next 10 days for further evaluation.

## 2016-04-18 ENCOUNTER — Telehealth: Payer: Self-pay | Admitting: Internal Medicine

## 2016-04-18 NOTE — Progress Notes (Signed)
[name] [address], [place] [address] Phone: [phone] Subjective:    I'm seeing this patient by the request  of:    CC: Left hip pain f/u   [id number]  Austin [name] is a 83 y.o. male coming in with complaint of left hip pain. Patient was found to have more of a lumbar radiculopathy. Patient did have degenerative disc disease of the lumbar spine noted previously. Patient's had x-rays showing very mild arthritic changes. Patient was started on gabapentin and states that he has made significant improvement at this time. States that he is feeling 80% better. States that he is married barely complaint of pain. Is able to get up in hold his weight better on this leg as well. Feels that changes in his walker but they made also helped.     patient did have x-rays taken today. Lumbar spine x-rays show multilevel degenerative disc disease and spondylosis mostly mild to moderate in severity. Patient also had x-rays of both hips that show very mild osteophytic changes.  Past Medical History:  Diagnosis Date  . ADENOCARCINOMA, PROSTATE, GLEASON GRADE 5 01/21/2009  . ALLERGIC RHINITIS 01/14/2007  . CKD (chronic kidney disease) stage 3, GFR 30-59 ml/min 04/30/2015  . COLONIC POLYPS, HX OF 01/14/2007  . ELEVATED PROSTATE SPECIFIC ANTIGEN 03/27/2008  . ESOPHAGITIS 01/14/2007  . Hypercholesterolemia   . HYPERLIPIDEMIA 01/14/2007  . HYPERTENSION 01/14/2007  . Hypertension   . LIBIDO, DECREASED 01/15/2007   Past Surgical History:  Procedure Laterality Date  . ESOPHAGOGASTRODUODENOSCOPY N/A 08/15/2013   Procedure: ESOPHAGOGASTRODUODENOSCOPY (EGD);  Surgeon: Austin [facility], MD;  Location: [facility];  Service: General;  Laterality: N/A;  . HERNIA REPAIR    . PEG PLACEMENT N/A 09/02/2013   Procedure: PERCUTANEOUS ENDOSCOPIC GASTROSTOMY (PEG) PLACEMENT;  Surgeon: Austin Ober, MD;  Location: [facility];  Service: General;  Laterality: N/A;  .  PROSTATE CRYOABLATION     Social History   Social History  . Marital status: Married    Spouse name: N/A  . Number of children: 3  . Years of education: 46   Occupational History  . [employer]   Social History Main Topics  . Smoking status: Former Research scientist (life sciences)  . Smokeless tobacco: Never Used  . Alcohol use No  . Drug use: No  . Sexual activity: Not Asked   Other Topics Concern  . 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 Working full time. Discussion needed in regard to Advance Care Planning-DNR/DNI, no artificial feeding or hydration, No HD, no heroic or futile.                No Known Allergies Family History  Problem Relation Age of Onset  . Lung cancer Father   . Hypertension Other   . Arthritis      Past medical history, social, surgical and family history all reviewed in electronic medical record.  No pertanent information unless stated regarding to the chief complaint.   Review of Systems: No headache, visual changes, nausea, vomiting, diarrhea, constipation, dizziness, abdominal pain, skin rash, fevers, chills, night sweats, weight loss, swollen lymph nodes,  chest pain, shortness of breath, mood changes.  Still positive for  muscle aches  Objective  Blood pressure 136/80, pulse 82, height 5'  6.5" (1.689 m), weight 189 lb 9.6 oz (86 kg), SpO2 95 %. Systems examined below as of 04/19/16   General: No apparent distress alert and oriented x3 mood and affect normal, dressed appropriately. Hemiparesis on the left side of the body. HEENT: Pupils equal, extraocular movements intact  Respiratory: Patient's speak in full sentences and does not appear short of breath  Cardiovascular: No lower extremity edema, non tender, no erythema  Skin: Warm dry intact with no signs of infection or rash on extremities  or on axial skeleton.  Abdomen: Soft nontender  Neuro: Cranial nerves II through XII are intact, neurovascularly intact in all extremities with 2+ pulses. 1+ tendon reflexes in the lower Jimmy's bilaterally. Lymph: No lymphadenopathy of posterior or anterior cervical chain or axillae bilaterally.  Gait Patient is in a wheelchair. RVA:CQPEAK arthritic changes of multiple joints. Patient does have left-sided hemiparesis secondary to injury. Cogwheeling noted. Patient's left leg shows significant weakness.  Weakness seems to be somewhat improved with the plantar flexion was seen previously. Better neurovascularly intact as well. Negative straight leg test.   Impression and Recommendations:     This case required medical decision making of moderate complexity.      Note: This dictation was prepared with Dragon dictation along with smaller phrase technology. Any transcriptional errors that result from this process are unintentional.

## 2016-04-18 NOTE — Telephone Encounter (Signed)
Called patient to schedule awv. lvm for patient to call office to schedule appt.

## 2016-04-19 ENCOUNTER — Ambulatory Visit (INDEPENDENT_AMBULATORY_CARE_PROVIDER_SITE_OTHER): Payer: Medicare Other | Admitting: Family Medicine

## 2016-04-19 ENCOUNTER — Encounter: Payer: Self-pay | Admitting: Family Medicine

## 2016-04-19 DIAGNOSIS — M5416 Radiculopathy, lumbar region: Secondary | ICD-10-CM

## 2016-04-19 NOTE — Patient Instructions (Signed)
Good to see you  Austin [name] is your friend.  Stay active Enjoy the weather Use the gabapentin  See me when you need me.

## 2016-04-19 NOTE — Assessment & Plan Note (Signed)
Improve with conservative therapy and gabapentin. No change in management. Follow-up as needed.

## 2016-04-26 ENCOUNTER — Ambulatory Visit (INDEPENDENT_AMBULATORY_CARE_PROVIDER_SITE_OTHER): Payer: Medicare Other | Admitting: *Deleted

## 2016-04-26 DIAGNOSIS — Z7901 Long term (current) use of anticoagulants: Secondary | ICD-10-CM

## 2016-04-26 DIAGNOSIS — I480 Paroxysmal atrial fibrillation: Secondary | ICD-10-CM

## 2016-04-26 LAB — POCT INR: INR: 2.5

## 2016-05-18 ENCOUNTER — Other Ambulatory Visit (INDEPENDENT_AMBULATORY_CARE_PROVIDER_SITE_OTHER): Payer: Medicare Other

## 2016-05-18 ENCOUNTER — Encounter: Payer: Self-pay | Admitting: Internal Medicine

## 2016-05-18 ENCOUNTER — Ambulatory Visit (INDEPENDENT_AMBULATORY_CARE_PROVIDER_SITE_OTHER): Payer: Medicare Other | Admitting: Internal Medicine

## 2016-05-18 VITALS — BP 116/64 | HR 64

## 2016-05-18 DIAGNOSIS — J309 Allergic rhinitis, unspecified: Secondary | ICD-10-CM | POA: Diagnosis not present

## 2016-05-18 DIAGNOSIS — N183 Chronic kidney disease, stage 3 unspecified: Secondary | ICD-10-CM

## 2016-05-18 DIAGNOSIS — I10 Essential (primary) hypertension: Secondary | ICD-10-CM | POA: Diagnosis not present

## 2016-05-18 DIAGNOSIS — E785 Hyperlipidemia, unspecified: Secondary | ICD-10-CM

## 2016-05-18 LAB — CBC WITH DIFFERENTIAL/PLATELET
Basophils Absolute: 0.1 10*3/uL (ref 0.0–0.1)
Basophils Relative: 0.7 % (ref 0.0–3.0)
Eosinophils Absolute: 0.2 10*3/uL (ref 0.0–0.7)
Eosinophils Relative: 1.7 % (ref 0.0–5.0)
HCT: 43.5 % (ref 39.0–52.0)
Hemoglobin: 14.3 g/dL (ref 13.0–17.0)
Lymphocytes Relative: 20 % (ref 12.0–46.0)
Lymphs Abs: 2 10*3/uL (ref 0.7–4.0)
MCHC: 32.9 g/dL (ref 30.0–36.0)
MCV: 93.5 fl (ref 78.0–100.0)
Monocytes Absolute: 1.1 10*3/uL — ABNORMAL HIGH (ref 0.1–1.0)
Monocytes Relative: 11.5 % (ref 3.0–12.0)
Neutro Abs: 6.6 10*3/uL (ref 1.4–7.7)
Neutrophils Relative %: 66.1 % (ref 43.0–77.0)
Platelets: 176 10*3/uL (ref 150.0–400.0)
RBC: 4.66 Mil/uL (ref 4.22–5.81)
RDW: 14.3 % (ref 11.5–15.5)
WBC: 9.9 10*3/uL (ref 4.0–10.5)

## 2016-05-18 LAB — BASIC METABOLIC PANEL
BUN: 30 mg/dL — ABNORMAL HIGH (ref 6–23)
CO2: 28 mEq/L (ref 19–32)
Calcium: 9.3 mg/dL (ref 8.4–10.5)
Chloride: 104 mEq/L (ref 96–112)
Creatinine, Ser: 1.79 mg/dL — ABNORMAL HIGH (ref 0.40–1.50)
GFR: 38.9 mL/min — ABNORMAL LOW (ref >60.00)
Glucose, Bld: 101 mg/dL — ABNORMAL HIGH (ref 70–99)
Potassium: 4.2 mEq/L (ref 3.5–5.1)
Sodium: 137 mEq/L (ref 135–145)

## 2016-05-18 LAB — LIPID PANEL
Cholesterol: 165 mg/dL (ref 0–200)
HDL: 35 mg/dL — ABNORMAL LOW (ref >39.00)
NonHDL: 129.98
Total CHOL/HDL Ratio: 5
Triglycerides: 217 mg/dL — ABNORMAL HIGH (ref 0.0–149.0)
VLDL: 43.4 mg/dL — ABNORMAL HIGH (ref 0.0–40.0)

## 2016-05-18 LAB — HEPATIC FUNCTION PANEL
ALT: 8 U/L (ref 0–53)
AST: 11 U/L (ref 0–37)
Albumin: 4.1 g/dL (ref 3.5–5.2)
Alkaline Phosphatase: 59 U/L (ref 39–117)
Bilirubin, Direct: 0.1 mg/dL (ref 0.0–0.3)
Total Bilirubin: 0.5 mg/dL (ref 0.2–1.2)
Total Protein: 7 g/dL (ref 6.0–8.3)

## 2016-05-18 LAB — LDL CHOLESTEROL, DIRECT: Direct LDL: 84 mg/dL

## 2016-05-18 MED ORDER — ROSUVASTATIN CALCIUM 20 MG PO TABS: 20.0000 mg | ORAL_TABLET | Freq: Every day | ORAL | 3 refills | Status: DC

## 2016-05-18 MED ORDER — LISINOPRIL-HYDROCHLOROTHIAZIDE 10-12.5 MG PO TABS: 1.0000 | ORAL_TABLET | Freq: Every day | ORAL | 3 refills | Status: DC

## 2016-05-18 MED ORDER — OMEPRAZOLE 40 MG PO CPDR: 40.0000 mg | DELAYED_RELEASE_CAPSULE | Freq: Every day | ORAL | 3 refills | Status: DC

## 2016-05-18 NOTE — Assessment & Plan Note (Signed)
stable overall by history and exam, recent data reviewed with pt, and pt to continue medical treatment as before,  to f/u any worsening symptoms or concerns Lab Results  Component Value Date   LDLCALC 110 (H) 04/03/2013   Goal < 70, for f/u lab today, cont crestor, encouraged compliance with med and diet

## 2016-05-18 NOTE — Progress Notes (Signed)
Pre visit review using our clinic review tool, if applicable. No additional management support is needed unless otherwise documented below in the visit note. 

## 2016-05-18 NOTE — Patient Instructions (Addendum)
OK to try OTC Claritin and Nasacort for the allergies  Please continue all other medications as before, and refills have been done if requested.  Please have the pharmacy call with any other refills you may need.  Please continue your efforts at being more active, low cholesterol diet, and weight control.  You are otherwise up to date with prevention measures today.  Please keep your appointments with your specialists as you may have planned  Please go to the LAB in the Basement (turn left off the elevator) for the tests to be done today  You will be contacted by phone if any changes need to be made immediately.  Otherwise, you will receive a letter about your results with an explanation, but please check with MyChart first.  Please remember to sign up for MyChart if you have not done so, as this will be important to you in the future with finding out test results, communicating by private email, and scheduling acute appointments online when needed.  Please return in 6 months, or sooner if needed

## 2016-05-18 NOTE — Assessment & Plan Note (Signed)
Mild to mod, for otc clairitin and nasacort,  to f/u any worsening symptoms or concerns

## 2016-05-18 NOTE — Assessment & Plan Note (Signed)
stable overall by history and exam, recent data reviewed with pt, and pt to continue medical treatment as before,  to f/u any worsening symptoms or concerns Lab Results  Component Value Date   CREATININE 1.53 (H) 04/30/2015    

## 2016-05-18 NOTE — Assessment & Plan Note (Signed)
stable overall by history and exam, recent data reviewed with pt, and pt to continue medical treatment as before,  to f/u any worsening symptoms or concerns BP Readings from Last 3 Encounters:  05/18/16 116/64  04/19/16 136/80  04/06/16 130/70

## 2016-05-18 NOTE — Progress Notes (Signed)
Subjective:    Patient ID: Austin [name], male    DOB: 03-Apr-1932, 83 y.o.   MRN: [id number]  HPI  Here to f/u; overall doing ok,  Pt denies chest pain, increasing sob or doe, wheezing, orthopnea, PND, increased LE swelling, palpitations, dizziness or syncope.  Pt denies new neurological symptoms such as new headache, or facial or extremity weakness or numbness.  Pt denies polydipsia, polyuria, or low sugar episode.   Pt denies new neurological symptoms such as new headache, or facial or extremity weakness or numbness.   Pt states overall good compliance with meds, mostly trying to follow appropriate diet, with wt overall stable,  but little exercise however.  Does have several wks ongoing nasal allergy symptoms with clearish congestion, itch and sneezing, without fever, pain, ST, cough, swelling or wheezing. No new complaints Past Medical History:  Diagnosis Date  . ADENOCARCINOMA, PROSTATE, GLEASON GRADE 5 01/21/2009  . ALLERGIC RHINITIS 01/14/2007  . CKD (chronic kidney disease) stage 3, GFR 30-59 ml/min 04/30/2015  . COLONIC POLYPS, HX OF 01/14/2007  . ELEVATED PROSTATE SPECIFIC ANTIGEN 03/27/2008  . ESOPHAGITIS 01/14/2007  . Hypercholesterolemia   . HYPERLIPIDEMIA 01/14/2007  . HYPERTENSION 01/14/2007  . Hypertension   . LIBIDO, DECREASED 01/15/2007   Past Surgical History:  Procedure Laterality Date  . ESOPHAGOGASTRODUODENOSCOPY N/A 08/15/2013   Procedure: ESOPHAGOGASTRODUODENOSCOPY (EGD);  Surgeon: Gwenyth Ober, MD;  Location: [facility];  Service: General;  Laterality: N/A;  . HERNIA REPAIR    . PEG PLACEMENT N/A 09/02/2013   Procedure: PERCUTANEOUS ENDOSCOPIC GASTROSTOMY (PEG) PLACEMENT;  Surgeon: Gwenyth Ober, MD;  Location: [facility];  Service: General;  Laterality: N/A;  . PROSTATE CRYOABLATION      reports that he has quit smoking. He has never used smokeless tobacco. He reports that he does not drink alcohol or use drugs. family history includes Hypertension in his  other; Lung cancer in his father. No Known Allergies Current Outpatient Prescriptions on File Prior to Visit  Medication Sig Dispense Refill  . diclofenac sodium (VOLTAREN) 1 % GEL Apply 1 application topically 3 (three) times daily. Left wrist 3 Tube 4  . gabapentin (NEURONTIN) 100 MG capsule Take 2 capsules (200 mg total) by mouth at bedtime. 60 capsule 3  . Multiple Vitamin (MULTIVITAMIN) tablet Take 1 tablet by mouth daily.    [name] warfarin (COUMADIN) 5 MG tablet Take 1 1/2 tablets daily 60 tablet 4   No current facility-administered medications on file prior to visit.    Review of Systems  Constitutional: Negative for other unusual diaphoresis or sweats HENT: Negative for ear discharge or swelling Eyes: Negative for other worsening visual disturbances Respiratory: Negative for stridor or other swelling  Gastrointestinal: Negative for worsening distension or other blood Genitourinary: Negative for retention or other urinary change Musculoskeletal: Negative for other MSK pain or swelling Skin: Negative for color change or other new lesions Neurological: Negative for worsening tremors and other numbness  Psychiatric/Behavioral: Negative for worsening agitation or other fatigue All other system neg per pt and family with him    Objective:   Physical Exam BP 116/64   Pulse 64   SpO2 99%  VS noted,  Constitutional: Pt appears in NAD HENT: Head: NCAT.  Right Ear: External ear normal.  Left Ear: External ear normal.  Bilat tm's with mild erythema.  Max sinus areas non tender.  Pharynx with mild erythema, no exudate Eyes: . Pupils are equal, round, and reactive to light. Conjunctivae and EOM are normal  Nose: without d/c or deformity Neck: Neck supple. Gross normal ROM Cardiovascular: Normal rate and regular rhythm.   Pulmonary/Chest: Effort normal and breath sounds without rales or wheezing.  Abd:  Soft, NT, ND, + BS, no organomegaly Neurological: [name] alert. At baseline  orientation, motor no significant change Skin: Skin is warm. No rashes, other new lesions, no LE edema Psychiatric: Pt behavior is normal without agitation  No other exam findings    Assessment & Plan:

## 2016-05-19 ENCOUNTER — Encounter: Payer: Self-pay | Admitting: Internal Medicine

## 2016-05-24 ENCOUNTER — Ambulatory Visit (INDEPENDENT_AMBULATORY_CARE_PROVIDER_SITE_OTHER): Payer: Medicare Other | Admitting: *Deleted

## 2016-05-24 DIAGNOSIS — I480 Paroxysmal atrial fibrillation: Secondary | ICD-10-CM | POA: Diagnosis not present

## 2016-05-24 DIAGNOSIS — Z7901 Long term (current) use of anticoagulants: Secondary | ICD-10-CM

## 2016-05-24 LAB — POCT INR: INR: 2.6

## 2016-06-21 ENCOUNTER — Ambulatory Visit (INDEPENDENT_AMBULATORY_CARE_PROVIDER_SITE_OTHER): Payer: Medicare Other | Admitting: *Deleted

## 2016-06-21 DIAGNOSIS — I480 Paroxysmal atrial fibrillation: Secondary | ICD-10-CM

## 2016-06-21 DIAGNOSIS — Z7901 Long term (current) use of anticoagulants: Secondary | ICD-10-CM

## 2016-06-21 LAB — POCT INR: INR: 2.6

## 2016-07-27 ENCOUNTER — Emergency Department (HOSPITAL_COMMUNITY): Payer: Medicare Other

## 2016-07-27 ENCOUNTER — Observation Stay (HOSPITAL_COMMUNITY)
Admission: EM | Admit: 2016-07-27 | Discharge: 2016-07-29 | Disposition: A | Discharge status: Home / Self Care | Payer: Medicare Other | Attending: Internal Medicine | Admitting: Internal Medicine

## 2016-07-27 ENCOUNTER — Telehealth: Payer: Self-pay | Admitting: Internal Medicine

## 2016-07-27 ENCOUNTER — Encounter (HOSPITAL_COMMUNITY): Payer: Self-pay | Admitting: Emergency Medicine

## 2016-07-27 DIAGNOSIS — N1832 Chronic kidney disease, stage 3b: Secondary | ICD-10-CM | POA: Diagnosis present

## 2016-07-27 DIAGNOSIS — Z7901 Long term (current) use of anticoagulants: Secondary | ICD-10-CM | POA: Diagnosis not present

## 2016-07-27 DIAGNOSIS — L8990 Pressure ulcer of unspecified site, unspecified stage: Secondary | ICD-10-CM | POA: Insufficient documentation

## 2016-07-27 DIAGNOSIS — N183 Chronic kidney disease, stage 3 unspecified: Secondary | ICD-10-CM | POA: Diagnosis present

## 2016-07-27 DIAGNOSIS — Z87891 Personal history of nicotine dependence: Secondary | ICD-10-CM | POA: Diagnosis not present

## 2016-07-27 DIAGNOSIS — Z79899 Other long term (current) drug therapy: Secondary | ICD-10-CM | POA: Insufficient documentation

## 2016-07-27 DIAGNOSIS — M545 Low back pain, unspecified: Secondary | ICD-10-CM

## 2016-07-27 DIAGNOSIS — G8114 Spastic hemiplegia affecting left nondominant side: Secondary | ICD-10-CM | POA: Diagnosis present

## 2016-07-27 DIAGNOSIS — I872 Venous insufficiency (chronic) (peripheral): Secondary | ICD-10-CM | POA: Diagnosis not present

## 2016-07-27 DIAGNOSIS — M546 Pain in thoracic spine: Secondary | ICD-10-CM | POA: Diagnosis not present

## 2016-07-27 DIAGNOSIS — M549 Dorsalgia, unspecified: Secondary | ICD-10-CM | POA: Diagnosis present

## 2016-07-27 DIAGNOSIS — S069X9S Unspecified intracranial injury with loss of consciousness of unspecified duration, sequela: Secondary | ICD-10-CM | POA: Diagnosis not present

## 2016-07-27 DIAGNOSIS — W3400XA Accidental discharge from unspecified firearms or gun, initial encounter: Secondary | ICD-10-CM

## 2016-07-27 DIAGNOSIS — I10 Essential (primary) hypertension: Secondary | ICD-10-CM | POA: Diagnosis not present

## 2016-07-27 DIAGNOSIS — R52 Pain, unspecified: Secondary | ICD-10-CM | POA: Diagnosis not present

## 2016-07-27 DIAGNOSIS — S069X9A Unspecified intracranial injury with loss of consciousness of unspecified duration, initial encounter: Secondary | ICD-10-CM | POA: Diagnosis present

## 2016-07-27 DIAGNOSIS — I129 Hypertensive chronic kidney disease with stage 1 through stage 4 chronic kidney disease, or unspecified chronic kidney disease: Secondary | ICD-10-CM | POA: Insufficient documentation

## 2016-07-27 DIAGNOSIS — S0193XA Puncture wound without foreign body of unspecified part of head, initial encounter: Secondary | ICD-10-CM

## 2016-07-27 DIAGNOSIS — W3400XS Accidental discharge from unspecified firearms or gun, sequela: Secondary | ICD-10-CM | POA: Diagnosis not present

## 2016-07-27 DIAGNOSIS — S0190XS Unspecified open wound of unspecified part of head, sequela: Secondary | ICD-10-CM | POA: Diagnosis not present

## 2016-07-27 DIAGNOSIS — S069XAA Unspecified intracranial injury with loss of consciousness status unknown, initial encounter: Secondary | ICD-10-CM | POA: Diagnosis present

## 2016-07-27 HISTORY — DX: Paralytic syndrome, unspecified: G83.9

## 2016-07-27 LAB — CBC WITH DIFFERENTIAL/PLATELET
Basophils Absolute: 0 10*3/uL (ref 0.0–0.1)
Basophils Relative: 0 %
Eosinophils Absolute: 0.1 10*3/uL (ref 0.0–0.7)
Eosinophils Relative: 2 %
HCT: 41.8 % (ref 39.0–52.0)
Hemoglobin: 14.1 g/dL (ref 13.0–17.0)
Lymphocytes Relative: 20 %
Lymphs Abs: 1.4 10*3/uL (ref 0.7–4.0)
MCH: 31.4 pg (ref 26.0–34.0)
MCHC: 33.7 g/dL (ref 30.0–36.0)
MCV: 93.1 fL (ref 78.0–100.0)
Monocytes Absolute: 0.8 10*3/uL (ref 0.1–1.0)
Monocytes Relative: 11 %
Neutro Abs: 4.7 10*3/uL (ref 1.7–7.7)
Neutrophils Relative %: 67 %
Platelets: 158 10*3/uL (ref 150–400)
RBC: 4.49 MIL/uL (ref 4.22–5.81)
RDW: 14.4 % (ref 11.5–15.5)
WBC: 7 10*3/uL (ref 4.0–10.5)

## 2016-07-27 LAB — BASIC METABOLIC PANEL
Anion gap: 9 (ref 5–15)
BUN: 23 mg/dL — ABNORMAL HIGH (ref 6–20)
CO2: 26 mmol/L (ref 22–32)
Calcium: 9.2 mg/dL (ref 8.9–10.3)
Chloride: 104 mmol/L (ref 101–111)
Creatinine, Ser: 1.56 mg/dL — ABNORMAL HIGH (ref 0.61–1.24)
GFR calc Af Amer: 46 mL/min — ABNORMAL LOW (ref >60)
GFR calc non Af Amer: 40 mL/min — ABNORMAL LOW (ref >60)
Glucose, Bld: 101 mg/dL — ABNORMAL HIGH (ref 65–99)
Potassium: 3.8 mmol/L (ref 3.5–5.1)
Sodium: 139 mmol/L (ref 135–145)

## 2016-07-27 LAB — PROTIME-INR
INR: 2.17
Prothrombin Time: 24.5 seconds — ABNORMAL HIGH (ref 11.4–15.2)

## 2016-07-27 MED ORDER — SODIUM CHLORIDE 0.9% FLUSH
3.0000 mL | Freq: Two times a day (BID) | INTRAVENOUS | Status: DC
  Administered 2016-07-27 – 2016-07-29 (×4): 3 mL via INTRAVENOUS

## 2016-07-27 MED ORDER — MORPHINE SULFATE (PF) 4 MG/ML IV SOLN
4.0000 mg | INTRAVENOUS | Status: AC | PRN
  Administered 2016-07-27 (×2): 4 mg via INTRAVENOUS
  Filled 2016-07-27 (×2): qty 1

## 2016-07-27 MED ORDER — LISINOPRIL 10 MG PO TABS
10.0000 mg | ORAL_TABLET | Freq: Every day | ORAL | Status: DC
  Administered 2016-07-28 – 2016-07-29 (×2): 10 mg via ORAL
  Filled 2016-07-27 (×2): qty 1

## 2016-07-27 MED ORDER — WARFARIN - PHARMACIST DOSING INPATIENT
Freq: Every day | Status: DC
  Administered 2016-07-28: 18:00:00

## 2016-07-27 MED ORDER — ACETAMINOPHEN ER 650 MG PO TBCR: 1300.0000 mg | EXTENDED_RELEASE_TABLET | Freq: Three times a day (TID) | ORAL | Status: DC

## 2016-07-27 MED ORDER — ADULT MULTIVITAMIN W/MINERALS CH
1.0000 | ORAL_TABLET | Freq: Every day | ORAL | Status: DC
  Administered 2016-07-28 – 2016-07-29 (×2): 1 via ORAL
  Filled 2016-07-27 (×2): qty 1

## 2016-07-27 MED ORDER — ROSUVASTATIN CALCIUM 20 MG PO TABS
20.0000 mg | ORAL_TABLET | Freq: Every day | ORAL | Status: DC
  Administered 2016-07-27 – 2016-07-28 (×2): 20 mg via ORAL
  Filled 2016-07-27 (×2): qty 1

## 2016-07-27 MED ORDER — SODIUM CHLORIDE 0.9 % IV SOLN: 250.0000 mL | INTRAVENOUS | Status: DC | PRN

## 2016-07-27 MED ORDER — GABAPENTIN 300 MG PO CAPS: ORAL_CAPSULE | ORAL | 3 refills | Status: DC

## 2016-07-27 MED ORDER — HYDROCHLOROTHIAZIDE 12.5 MG PO CAPS
12.5000 mg | ORAL_CAPSULE | Freq: Every day | ORAL | Status: DC
  Administered 2016-07-28 – 2016-07-29 (×2): 12.5 mg via ORAL
  Filled 2016-07-27 (×2): qty 1

## 2016-07-27 MED ORDER — HYDROCODONE-ACETAMINOPHEN 5-325 MG PO TABS: 1.0000 | ORAL_TABLET | Freq: Four times a day (QID) | ORAL | 0 refills | Status: DC | PRN

## 2016-07-27 MED ORDER — LISINOPRIL-HYDROCHLOROTHIAZIDE 10-12.5 MG PO TABS: 1.0000 | ORAL_TABLET | Freq: Every day | ORAL | Status: DC

## 2016-07-27 MED ORDER — ACETAMINOPHEN 325 MG PO TABS
650.0000 mg | ORAL_TABLET | Freq: Four times a day (QID) | ORAL | Status: DC
  Administered 2016-07-27 – 2016-07-29 (×6): 650 mg via ORAL
  Filled 2016-07-27 (×6): qty 2

## 2016-07-27 MED ORDER — SODIUM CHLORIDE 0.9% FLUSH: 3.0000 mL | INTRAVENOUS | Status: DC | PRN

## 2016-07-27 MED ORDER — GABAPENTIN 100 MG PO CAPS
200.0000 mg | ORAL_CAPSULE | Freq: Every day | ORAL | Status: DC
  Administered 2016-07-27 – 2016-07-28 (×2): 200 mg via ORAL
  Filled 2016-07-27 (×2): qty 2

## 2016-07-27 MED ORDER — OXYCODONE HCL 5 MG PO TABS
5.0000 mg | ORAL_TABLET | ORAL | Status: DC | PRN
  Administered 2016-07-28 – 2016-07-29 (×2): 5 mg via ORAL
  Filled 2016-07-27 (×2): qty 1

## 2016-07-27 MED ORDER — CYCLOBENZAPRINE HCL 10 MG PO TABS
10.0000 mg | ORAL_TABLET | Freq: Three times a day (TID) | ORAL | Status: DC | PRN
  Administered 2016-07-28 – 2016-07-29 (×3): 10 mg via ORAL
  Filled 2016-07-27 (×3): qty 1

## 2016-07-27 MED ORDER — WARFARIN SODIUM 5 MG PO TABS
7.5000 mg | ORAL_TABLET | Freq: Once | ORAL | Status: AC
  Administered 2016-07-27: 7.5 mg via ORAL
  Filled 2016-07-27: qty 1

## 2016-07-27 NOTE — ED Triage Notes (Signed)
Pt reports having right flank pain worsening since Tuesday.  States hurts worse to hit a bump or be jostled around.

## 2016-07-27 NOTE — ED Notes (Signed)
Pt c/o right side back pain x 2 days. Denies injury or gi/gu sx.
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
## 2016-07-27 NOTE — Telephone Encounter (Signed)
Discussed with pt. She states that she is going to call the ambulance to transport him to [facility] because she can't even touch him he is in so much pain. I advised pt that she is doing the right thing.

## 2016-07-27 NOTE — Discharge Instructions (Signed)
Take the medications as needed for pain, follow up with your primary care doctor, return for fever, abdominal pain, worsening symptoms

## 2016-07-27 NOTE — Telephone Encounter (Signed)
Pt wife called back, her husband is much worse, she would like a call back

## 2016-07-27 NOTE — Telephone Encounter (Signed)
I would increase gabapentin to 300mg  at night Tylenol 500mg  3 times a day  1/2 the crestor. If worsening over the weekend needs to be seen immediately.
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
## 2016-07-27 NOTE — Telephone Encounter (Signed)
Patient's wife called and stated the patients back and legs are giving him a hard time. She states the medication use to work and now it seems it does not all. They have tired everything you spoke of before, nothing seems to be working. She wants to know if she can have another medication for him. She really didn't want to bring him in unless she had too. Please advise.

## 2016-07-28 ENCOUNTER — Observation Stay (HOSPITAL_COMMUNITY): Payer: Medicare Other

## 2016-07-28 DIAGNOSIS — W3400XS Accidental discharge from unspecified firearms or gun, sequela: Secondary | ICD-10-CM

## 2016-07-28 DIAGNOSIS — G8114 Spastic hemiplegia affecting left nondominant side: Secondary | ICD-10-CM | POA: Diagnosis not present

## 2016-07-28 DIAGNOSIS — I10 Essential (primary) hypertension: Secondary | ICD-10-CM

## 2016-07-28 DIAGNOSIS — N183 Chronic kidney disease, stage 3 (moderate): Secondary | ICD-10-CM | POA: Diagnosis not present

## 2016-07-28 DIAGNOSIS — M5440 Lumbago with sciatica, unspecified side: Secondary | ICD-10-CM

## 2016-07-28 DIAGNOSIS — L8990 Pressure ulcer of unspecified site, unspecified stage: Secondary | ICD-10-CM | POA: Insufficient documentation

## 2016-07-28 DIAGNOSIS — R52 Pain, unspecified: Secondary | ICD-10-CM

## 2016-07-28 DIAGNOSIS — M545 Low back pain: Secondary | ICD-10-CM | POA: Diagnosis not present

## 2016-07-28 DIAGNOSIS — S0190XS Unspecified open wound of unspecified part of head, sequela: Secondary | ICD-10-CM

## 2016-07-28 DIAGNOSIS — M5126 Other intervertebral disc displacement, lumbar region: Secondary | ICD-10-CM | POA: Diagnosis not present

## 2016-07-28 LAB — CBC
HCT: 40.4 % (ref 39.0–52.0)
Hemoglobin: 13.3 g/dL (ref 13.0–17.0)
MCH: 30.8 pg (ref 26.0–34.0)
MCHC: 32.9 g/dL (ref 30.0–36.0)
MCV: 93.5 fL (ref 78.0–100.0)
Platelets: 152 10*3/uL (ref 150–400)
RBC: 4.32 MIL/uL (ref 4.22–5.81)
RDW: 14.5 % (ref 11.5–15.5)
WBC: 7.2 10*3/uL (ref 4.0–10.5)

## 2016-07-28 LAB — BASIC METABOLIC PANEL
Anion gap: 8 (ref 5–15)
BUN: 22 mg/dL — ABNORMAL HIGH (ref 6–20)
CO2: 26 mmol/L (ref 22–32)
Calcium: 9 mg/dL (ref 8.9–10.3)
Chloride: 103 mmol/L (ref 101–111)
Creatinine, Ser: 1.43 mg/dL — ABNORMAL HIGH (ref 0.61–1.24)
GFR calc Af Amer: 51 mL/min — ABNORMAL LOW (ref >60)
GFR calc non Af Amer: 44 mL/min — ABNORMAL LOW (ref >60)
Glucose, Bld: 96 mg/dL (ref 65–99)
Potassium: 3.6 mmol/L (ref 3.5–5.1)
Sodium: 137 mmol/L (ref 135–145)

## 2016-07-28 LAB — PROTIME-INR
INR: 2.18
Prothrombin Time: 24.6 seconds — ABNORMAL HIGH (ref 11.4–15.2)

## 2016-07-28 MED ORDER — OXYCODONE HCL 5 MG PO TABS: 5.0000 mg | ORAL_TABLET | ORAL | 0 refills | Status: DC | PRN

## 2016-07-28 MED ORDER — PREDNISONE 10 MG PO TABS: 60.0000 mg | ORAL_TABLET | Freq: Every day | ORAL | 0 refills | Status: DC

## 2016-07-28 MED ORDER — WARFARIN SODIUM 5 MG PO TABS
7.5000 mg | ORAL_TABLET | Freq: Once | ORAL | Status: AC
  Administered 2016-07-28: 7.5 mg via ORAL
  Filled 2016-07-28: qty 1

## 2016-07-28 MED ORDER — FENTANYL CITRATE (PF) 100 MCG/2ML IJ SOLN: 25.0000 ug | INTRAMUSCULAR | Status: DC | PRN

## 2016-07-28 MED ORDER — CYCLOBENZAPRINE HCL 10 MG PO TABS: 10.0000 mg | ORAL_TABLET | Freq: Three times a day (TID) | ORAL | 0 refills | Status: DC | PRN
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
## 2016-07-28 NOTE — Care Management Note (Signed)
Case Management Note  Patient Details  Name: Austin [name] MRN: [id number] Date of Birth: 17-Sep-1932  Subjective/Objective:                  Admitted with back pain. Pt from home, lives with wife and has PD aid 40hrs per week. Pt uses hemi walker with ambulation. PT has recommended SNF and pt is agreeable, he understands he will have to pay OOP. Wife at bedside understands also. CSW aware.   Action/Plan: CSW to arrange for placement. No CM needs anticipated.   Expected Discharge Date:  07/28/16               Expected Discharge Plan:  [place]  In-House Referral:  Clinical Social Work  Discharge planning Services  CM Consult  Post Acute Care Choice:  NA Choice offered to:  NA  Status of Service:  Completed, signed off  Sherald Barge, RN 07/28/2016, 3:43 PM

## 2016-07-28 NOTE — Clinical Social Work Placement (Signed)
   CLINICAL SOCIAL WORK PLACEMENT  NOTE  Date:  07/28/2016  Patient Details  Name: Austin [name] MRN: [id number] Date of Birth: 1932/10/30  Clinical Social Work is seeking post-discharge placement for this patient at the [place] level of care (*CSW will initial, date and re-position this form in  chart as items are completed):  Yes   Patient/family provided with [place] Work Department's list of facilities offering this level of care within the geographic area requested by the patient (or if unable, by the patient's family).  Yes   Patient/family informed of their freedom to choose among providers that offer the needed level of care, that participate in Medicare, Medicaid or managed care program needed by the patient, have an available bed and are willing to accept the patient.  Yes   Patient/family informed of [facility]'s ownership interest in [facility] and [facility], as well as of the fact that they are under no obligation to receive care at these facilities.  PASRR submitted to EDS on 07/28/16     PASRR number received on 07/28/16     Existing PASRR number confirmed on       FL2 transmitted to all facilities in geographic area requested by pt/family on       FL2 transmitted to all facilities within larger geographic area on       Patient informed that his/her managed care company has contracts with or will negotiate with certain facilities, including the following:            Patient/family informed of bed offers received.  Patient chooses bed at       Physician recommends and patient chooses bed at      Patient to be transferred to   on  .  Patient to be transferred to facility by       Patient family notified on   of transfer.  Name of family member notified:        PHYSICIAN       Additional Comment:    _______________________________________________ Ihor Gully, LCSW 07/28/2016, 5:21 PM
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
## 2016-07-28 NOTE — Clinical Social Work Note (Signed)
LCSW spoke with Tami at [facility]. They currently have no male beds. Patient would also like to be referred to [facility] in [place].  LCSW spoke with Sydell Axon at [facility] who indicated that she was unsure if she could accept patient but would review his paperwork.  LCSW faxed referral information to [place] at [facility] via route function as they are not in [employer].   Reta Norgren, Clydene Pugh, LCSW

## 2016-07-28 NOTE — Progress Notes (Signed)
Patient and family state that they are not prepared to take patient home at this time, stating they are willing to take him home tomorrow morning. MD made aware. MD stated he was okay with the patient staying tonight.  Celestia Khat, RN

## 2016-07-28 NOTE — Clinical Social Work Note (Signed)
Patient Information   Patient Name Austin [name], Austin [name] ([id number]) Sex Male DOB [birth year] SSN [id number]   Room Bed  A325 A325-01  Patient Demographics   Address [address] [place] [address] Contact Numbers [phone]  Patient Ethnicity & Race   Ethnic Group Patient Race  Not Hispanic or Latino White or Caucasian Black or African American  Emergency Contact(s)   Name Relation Home Work Mobile  [place] Spouse [phone] [place]   [phone]  Documents on File    Status Date Received Description  Documents for the Patient  EMR Medication Summary Not Received    EMR Problem Summary Not Received    EMR Patient Summary Not Received    AMB Correspondence Not Received  [facility] 04/12 [facility] Spec   AMB Correspondence Not Received  Uro 06/12 [facility] Spec   [place] HIPAA NOTICE OF PRIVACY - Scanned Received 08/09/11   [place] E-Signature HIPAA Notice of Privacy Received 02/01/11   [place] E-Signature HIPAA Notice of Privacy Spanish Not Received    Driver's License Not Received    Insurance Card Received 08/09/11   Advance Directives/Living Will/HCPOA/POA Not Received    Financial Application Not Received    Insurance Card Received 02/01/11 MCR & BCBS  AMB Correspondence Not Received  06/13 [place] HIPAA NOTICE OF PRIVACY - Scanned Not Received    Insurance Card Not Received    AMB Correspondence Not Received  01/14 [facility]  AMB Correspondence  04/12/13 03/15 [place] HIPAA NOTICE OF PRIVACY - Scanned Not Received    [place] E-Signature HIPAA Notice of Privacy Received 67/61/95   Driver's License Not Received    Insurance Card Received 06/16/15 BCBS MCR SUPP/AP Outpt Rehab  Advance Directives/Living Will/HCPOA/POA Not Received    [employer] Notice of Privacy - Scanned Not Received 09/22/13   [employer] Notice  of Privacy - E Signature  09/22/13   Other Not Received 09/22/13   AMB HH/NH/Hospice  10/23/13 ORDERS [facility]  AMB HH/NH/Hospice  10/24/13 ORDERS [facility]  AMB HH/NH/Hospice  10/27/13 ORDERS [facility]  AMB HH/NH/Hospice  10/24/13 ORDER [facility]  AMB HH/NH/Hospice  10/23/13 ORDERS [facility]  AMB HH/NH/Hospice  10/05/13 POC [facility]  AMB HH/NH/Hospice  10/22/13 ORDER [facility]  AMB HH/NH/Hospice  11/07/13 ORDER [facility]  AMB HH/NH/Hospice  12/16/13 10/15 ORDERS [facility]  AMB HH/NH/Hospice  11/27/13 ORDERS [facility]  AMB HH/NH/Hospice  12/05/13 ORDER [facility]  AMB HH/NH/Hospice  11/24/13 ORDER [facility]  AMB HH/NH/Hospice  12/03/13 ORDERS [facility]  AMB HH/NH/Hospice  09/32/67 CERTIFICATION/POC [facility]  AMB HH/NH/Hospice  12/10/13 ORDER [facility]  AMB Correspondence  01/12/14 ORDERS [facility]  AMB HH/NH/Hospice  01/12/14 ORDERS [facility]  AMB HH/NH/Hospice  12/31/13 ORDER [facility] [place]  AMB HH/NH/Hospice  01/14/14 ORDERS [facility]  AMB HH/NH/Hospice  12/26/13 ORDER MEDICAL MODALITIES  AMB HH/NH/Hospice  10/02/13 ORDER [facility]  Insurance Card Received 02/03/14 medicare/bcbs sup/tp/lbpce  Other Photo ID Not Received    AMB Provider Completed Forms  04/06/14 REFERRAL FORM [facility]  AMB Correspondence  05/14/14 PATIENT HANDOUT/KEG[place] EXERCISES  AMB Correspondence  12/45/80 PT POC/CERT/RECERT [facility]  AMB Correspondence  04/06/14 REFERRAL FORM [place] Card Received 07/02/14 [facility]  [place] HIPAA NOTICE OF PRIVACY - Scanned Received 07/02/14 [facility]  AMB Correspondence  06/25/14 OFFICE NOTE [facility]  AMB Correspondence  06/23/14 OFFICE NOTE [facility]  AMB Correspondence  05/28/14 REFERRAL FORM [facility]   AMB Correspondence  13/24/40 POC/CERT [place] [facility]  AMB HH/NH/Hospice  11/11/14 LETTER/CARE PLAN [facility]  Insurance Card Received 03/02/15 bcbs iss ued 8.1.12  AMB Provider Completed Forms  11/19/23 RX/LETTER/CERTIFICATE OF [facility]  AMB Correspondence  07/13/15 LETTER  AMB Correspondence  07/28/15 OFFICE NOTES [facility]  AMB Correspondence  11/24/15 LETTER/CARE PLAN [facility]  AMB Correspondence (Deleted) 04/12/13   AMB Correspondence (Deleted) 12/31/13 ORDER [facility] [place]  AMB Correspondence (Deleted) 36/64/40 POC/CERT  AMB HH/NH/Hospice (Deleted) 11/11/14 LETTER [facility]  AMB Correspondence (Deleted) 07/13/15 LETTER  Patient Photo   Photo of Patient  Documents for the Encounter  AOB (Assignment of Insurance Benefits) Not Received    E-signature AOB Signed 07/27/16   MEDICARE RIGHTS Not Received    E-signature Medicare Rights Signed 07/27/16   Discharge Attachment   Back Pain Adult (English)  After Visit Summary   ED After Visit Summary  ED Patient Billing Extract   ED PB Summary  ED Patient Billing Extract   ED Encounter Summary  ED Discharge     Admission Information   Attending Provider Admitting Provider Admission Type Admission Date/Time  Tat, Shanon Brow, MD Phillips Grout, MD Emergency 07/27/16 1355  Discharge Date Hospital Service Auth/Cert Status Service Area   Internal Medicine Incomplete [place]  Unit Room/Bed Admission Status   AP-DEPT 300 A325/A325-01 Admission (Confirmed)   Admission   Complaint  Back Pain  Hospital Account   Name Acct ID Class Status Primary Coverage  Austin [name], Austin [name] [id number] Observation Open MEDICARE - MEDICARE PART A AND B      Guarantor Account (for Hospital Account [id number])   Name Relation to Pt Service Area Active? Acct Type  Austin [name] Self CHSA Yes Personal/Family  Address Phone    [address] [place], [place] [address]  [phone])        Coverage Information (for Hospital Account [id number])   1. [place] PART A AND B   F/O Payor/Plan Precert #  MEDICARE/MEDICARE PART A AND B   Subscriber Subscriber #  Austin [name], Austin [name] [id number]  Address Phone  [address] [place], [place] [address]   2. BLUE CROSS BLUE SHIELD/BCBS SUPPLEMENT   F/O Payor/Plan Precert #  BLUE CROSS BLUE SHIELD/BCBS SUPPLEMENT   Subscriber Subscriber #  Austin [name], Austin [name] [id number]  Address Phone  [address] [place], [place] [address] [phone]

## 2016-07-28 NOTE — Care Management Obs Status (Signed)
[place] NOTIFICATION   Patient Details  Name: Austin [name] MRN: [id number] Date of Birth: [birth year]   Medicare Observation Status Notification Given:  Yes    Sherald Barge, RN 07/28/2016, 1:46 PM
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
## 2016-07-29 DIAGNOSIS — M545 Low back pain: Secondary | ICD-10-CM | POA: Diagnosis not present

## 2016-07-29 DIAGNOSIS — R279 Unspecified lack of coordination: Secondary | ICD-10-CM | POA: Diagnosis not present

## 2016-07-29 DIAGNOSIS — N183 Chronic kidney disease, stage 3 (moderate): Secondary | ICD-10-CM | POA: Diagnosis not present

## 2016-07-29 DIAGNOSIS — G8114 Spastic hemiplegia affecting left nondominant side: Secondary | ICD-10-CM | POA: Diagnosis not present

## 2016-07-29 DIAGNOSIS — Z7401 Bed confinement status: Secondary | ICD-10-CM | POA: Diagnosis not present

## 2016-07-29 DIAGNOSIS — R52 Pain, unspecified: Secondary | ICD-10-CM | POA: Diagnosis not present

## 2016-07-29 DIAGNOSIS — I10 Essential (primary) hypertension: Secondary | ICD-10-CM | POA: Diagnosis not present

## 2016-07-29 LAB — PROTIME-INR
INR: 2.32
Prothrombin Time: 25.9 seconds — ABNORMAL HIGH (ref 11.4–15.2)

## 2016-07-29 MED ORDER — WARFARIN - PHARMACIST DOSING INPATIENT: Status: DC

## 2016-07-29 MED ORDER — WARFARIN SODIUM 5 MG PO TABS: 7.5000 mg | ORAL_TABLET | Freq: Once | ORAL | Status: DC

## 2016-07-29 NOTE — Progress Notes (Signed)
Patient is to be discharged home and in stable condition. Patient's family opted to be discharged home as opposed to SNF stating they will place him from home upon their discretion. Patient and family present for discharge teaching and both verbalize understanding. Patient transported home by [facility].   Celestia Khat, RN

## 2016-07-29 NOTE — Care Management (Signed)
Late Entry: CM received call from [facility] on 07/28/16 stating they were unable to find pt bed at facility. CM spoke with pt wife to make arrangements for [facility]. They have used [facility] in the past and would like to use them again. Orders placed by Dr. Carles Collet and referral called to [place], [facility] rep. Who will obtain pt info from chart.
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
## 2016-07-29 NOTE — Clinical Social Work Note (Signed)
CSW attempted to reach [place] at [facility]. CSW received call from RN stating wife will take pt home and place in SNF from home if needed. CSW signing off as no further Social Work needs identified.   Oretha Ellis, [place], [place] Work Letitia Libra coverage) [phone]

## 2016-08-01 DIAGNOSIS — N312 Flaccid neuropathic bladder, not elsewhere classified: Secondary | ICD-10-CM | POA: Diagnosis not present

## 2016-08-01 DIAGNOSIS — C61 Malignant neoplasm of prostate: Secondary | ICD-10-CM | POA: Diagnosis not present

## 2016-08-02 ENCOUNTER — Ambulatory Visit (INDEPENDENT_AMBULATORY_CARE_PROVIDER_SITE_OTHER): Payer: Medicare Other | Admitting: *Deleted

## 2016-08-02 DIAGNOSIS — I480 Paroxysmal atrial fibrillation: Secondary | ICD-10-CM

## 2016-08-02 DIAGNOSIS — Z7901 Long term (current) use of anticoagulants: Secondary | ICD-10-CM | POA: Diagnosis not present

## 2016-08-02 LAB — POCT INR: INR: 3

## 2016-08-10 ENCOUNTER — Telehealth: Payer: Self-pay | Admitting: Internal Medicine

## 2016-08-10 DIAGNOSIS — I129 Hypertensive chronic kidney disease with stage 1 through stage 4 chronic kidney disease, or unspecified chronic kidney disease: Secondary | ICD-10-CM | POA: Diagnosis not present

## 2016-08-10 DIAGNOSIS — N183 Chronic kidney disease, stage 3 (moderate): Secondary | ICD-10-CM | POA: Diagnosis not present

## 2016-08-10 DIAGNOSIS — Z86718 Personal history of other venous thrombosis and embolism: Secondary | ICD-10-CM | POA: Diagnosis not present

## 2016-08-10 DIAGNOSIS — G8929 Other chronic pain: Secondary | ICD-10-CM | POA: Diagnosis not present

## 2016-08-10 DIAGNOSIS — I872 Venous insufficiency (chronic) (peripheral): Secondary | ICD-10-CM | POA: Diagnosis not present

## 2016-08-10 DIAGNOSIS — Z7901 Long term (current) use of anticoagulants: Secondary | ICD-10-CM | POA: Diagnosis not present

## 2016-08-10 DIAGNOSIS — G8112 Spastic hemiplegia affecting left dominant side: Secondary | ICD-10-CM | POA: Diagnosis not present

## 2016-08-10 DIAGNOSIS — Y249XXS Unspecified firearm discharge, undetermined intent, sequela: Secondary | ICD-10-CM | POA: Diagnosis not present

## 2016-08-10 DIAGNOSIS — Z87891 Personal history of nicotine dependence: Secondary | ICD-10-CM | POA: Diagnosis not present

## 2016-08-10 DIAGNOSIS — S069X9S Unspecified intracranial injury with loss of consciousness of unspecified duration, sequela: Secondary | ICD-10-CM | POA: Diagnosis not present

## 2016-08-10 DIAGNOSIS — Z5181 Encounter for therapeutic drug level monitoring: Secondary | ICD-10-CM | POA: Diagnosis not present

## 2016-08-10 DIAGNOSIS — M549 Dorsalgia, unspecified: Secondary | ICD-10-CM | POA: Diagnosis not present

## 2016-08-10 NOTE — Telephone Encounter (Signed)
Verbal orders were given 

## 2016-08-10 NOTE — Telephone Encounter (Signed)
[place] for verbal if ok to do it that way, thanks

## 2016-08-10 NOTE — Telephone Encounter (Signed)
Needs orders for occupational therapy for an evaluation

## 2016-08-11 DIAGNOSIS — M549 Dorsalgia, unspecified: Secondary | ICD-10-CM | POA: Diagnosis not present

## 2016-08-11 DIAGNOSIS — G8112 Spastic hemiplegia affecting left dominant side: Secondary | ICD-10-CM | POA: Diagnosis not present

## 2016-08-11 DIAGNOSIS — N183 Chronic kidney disease, stage 3 (moderate): Secondary | ICD-10-CM | POA: Diagnosis not present

## 2016-08-11 DIAGNOSIS — S069X9S Unspecified intracranial injury with loss of consciousness of unspecified duration, sequela: Secondary | ICD-10-CM | POA: Diagnosis not present

## 2016-08-11 DIAGNOSIS — I129 Hypertensive chronic kidney disease with stage 1 through stage 4 chronic kidney disease, or unspecified chronic kidney disease: Secondary | ICD-10-CM | POA: Diagnosis not present

## 2016-08-11 DIAGNOSIS — G8929 Other chronic pain: Secondary | ICD-10-CM | POA: Diagnosis not present

## 2016-08-14 DIAGNOSIS — G8929 Other chronic pain: Secondary | ICD-10-CM | POA: Diagnosis not present

## 2016-08-14 DIAGNOSIS — S069X9S Unspecified intracranial injury with loss of consciousness of unspecified duration, sequela: Secondary | ICD-10-CM | POA: Diagnosis not present

## 2016-08-14 DIAGNOSIS — N183 Chronic kidney disease, stage 3 (moderate): Secondary | ICD-10-CM | POA: Diagnosis not present

## 2016-08-14 DIAGNOSIS — I129 Hypertensive chronic kidney disease with stage 1 through stage 4 chronic kidney disease, or unspecified chronic kidney disease: Secondary | ICD-10-CM | POA: Diagnosis not present

## 2016-08-14 DIAGNOSIS — M549 Dorsalgia, unspecified: Secondary | ICD-10-CM | POA: Diagnosis not present

## 2016-08-14 DIAGNOSIS — G8112 Spastic hemiplegia affecting left dominant side: Secondary | ICD-10-CM | POA: Diagnosis not present

## 2016-08-15 ENCOUNTER — Ambulatory Visit (INDEPENDENT_AMBULATORY_CARE_PROVIDER_SITE_OTHER): Payer: Medicare Other | Admitting: Family Medicine

## 2016-08-15 ENCOUNTER — Encounter: Payer: Self-pay | Admitting: Family Medicine

## 2016-08-15 DIAGNOSIS — M549 Dorsalgia, unspecified: Secondary | ICD-10-CM | POA: Diagnosis not present

## 2016-08-15 DIAGNOSIS — M5416 Radiculopathy, lumbar region: Secondary | ICD-10-CM

## 2016-08-15 DIAGNOSIS — S069X9S Unspecified intracranial injury with loss of consciousness of unspecified duration, sequela: Secondary | ICD-10-CM | POA: Diagnosis not present

## 2016-08-15 DIAGNOSIS — G8112 Spastic hemiplegia affecting left dominant side: Secondary | ICD-10-CM | POA: Diagnosis not present

## 2016-08-15 DIAGNOSIS — I129 Hypertensive chronic kidney disease with stage 1 through stage 4 chronic kidney disease, or unspecified chronic kidney disease: Secondary | ICD-10-CM | POA: Diagnosis not present

## 2016-08-15 DIAGNOSIS — G8929 Other chronic pain: Secondary | ICD-10-CM | POA: Diagnosis not present

## 2016-08-15 DIAGNOSIS — N183 Chronic kidney disease, stage 3 (moderate): Secondary | ICD-10-CM | POA: Diagnosis not present

## 2016-08-15 NOTE — Assessment & Plan Note (Signed)
Seems stable at this time. We discussed more of an action plan at this time. We discussed the possibility of prednisone but with patient being on a blood thinner we went to avoid this as much as possible. Encourage him to continue the gabapentin if it is deathly helping at night at the higher dose and we'll refill accordingly. We discussed which activities to do in which ones to avoid. Patient will start to increase activity slowly. We will start with home health physical therapy that I think could be beneficial. Following up again with me in 4-6 weeks.
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
## 2016-08-15 NOTE — Patient Instructions (Signed)
Good to see you  If worsening pain then give me a call and can do prednisone but hopefully this will not be needed Lets continue the gabapentin where itr is  I think therapy will be great at home pennsaid pinkie amount topically 2 times daily as needed.   Ice 20 minutes 2 times daily. Usually after activity and before bed. See me again in 4 weeks if any worsening symptoms.

## 2016-08-17 ENCOUNTER — Telehealth: Payer: Self-pay | Admitting: Internal Medicine

## 2016-08-17 DIAGNOSIS — G8112 Spastic hemiplegia affecting left dominant side: Secondary | ICD-10-CM | POA: Diagnosis not present

## 2016-08-17 DIAGNOSIS — I129 Hypertensive chronic kidney disease with stage 1 through stage 4 chronic kidney disease, or unspecified chronic kidney disease: Secondary | ICD-10-CM | POA: Diagnosis not present

## 2016-08-17 DIAGNOSIS — M549 Dorsalgia, unspecified: Secondary | ICD-10-CM | POA: Diagnosis not present

## 2016-08-17 DIAGNOSIS — G8929 Other chronic pain: Secondary | ICD-10-CM | POA: Diagnosis not present

## 2016-08-17 DIAGNOSIS — S069X9S Unspecified intracranial injury with loss of consciousness of unspecified duration, sequela: Secondary | ICD-10-CM | POA: Diagnosis not present

## 2016-08-17 DIAGNOSIS — N183 Chronic kidney disease, stage 3 (moderate): Secondary | ICD-10-CM | POA: Diagnosis not present

## 2016-08-17 NOTE — Telephone Encounter (Signed)
Called [name], LVM informing her of the ok to proceed with verbal orders.

## 2016-08-17 NOTE — Telephone Encounter (Signed)
Ok for verbal 

## 2016-08-17 NOTE — Telephone Encounter (Signed)
Austin [name] from [facility] called requesting an "OK" to be able to go out to see the pt and discuss social work services/community resources.

## 2016-08-18 DIAGNOSIS — G8929 Other chronic pain: Secondary | ICD-10-CM | POA: Diagnosis not present

## 2016-08-18 DIAGNOSIS — S069X9S Unspecified intracranial injury with loss of consciousness of unspecified duration, sequela: Secondary | ICD-10-CM | POA: Diagnosis not present

## 2016-08-18 DIAGNOSIS — I129 Hypertensive chronic kidney disease with stage 1 through stage 4 chronic kidney disease, or unspecified chronic kidney disease: Secondary | ICD-10-CM | POA: Diagnosis not present

## 2016-08-18 DIAGNOSIS — G8112 Spastic hemiplegia affecting left dominant side: Secondary | ICD-10-CM | POA: Diagnosis not present

## 2016-08-18 DIAGNOSIS — N183 Chronic kidney disease, stage 3 (moderate): Secondary | ICD-10-CM | POA: Diagnosis not present

## 2016-08-18 DIAGNOSIS — M549 Dorsalgia, unspecified: Secondary | ICD-10-CM | POA: Diagnosis not present

## 2016-08-22 DIAGNOSIS — G8112 Spastic hemiplegia affecting left dominant side: Secondary | ICD-10-CM | POA: Diagnosis not present

## 2016-08-22 DIAGNOSIS — I872 Venous insufficiency (chronic) (peripheral): Secondary | ICD-10-CM | POA: Diagnosis not present

## 2016-08-22 DIAGNOSIS — Y249XXS Unspecified firearm discharge, undetermined intent, sequela: Secondary | ICD-10-CM | POA: Diagnosis not present

## 2016-08-22 DIAGNOSIS — Z86718 Personal history of other venous thrombosis and embolism: Secondary | ICD-10-CM | POA: Diagnosis not present

## 2016-08-22 DIAGNOSIS — I129 Hypertensive chronic kidney disease with stage 1 through stage 4 chronic kidney disease, or unspecified chronic kidney disease: Secondary | ICD-10-CM | POA: Diagnosis not present

## 2016-08-22 DIAGNOSIS — M549 Dorsalgia, unspecified: Secondary | ICD-10-CM | POA: Diagnosis not present

## 2016-08-22 DIAGNOSIS — S069X9S Unspecified intracranial injury with loss of consciousness of unspecified duration, sequela: Secondary | ICD-10-CM | POA: Diagnosis not present

## 2016-08-22 DIAGNOSIS — N183 Chronic kidney disease, stage 3 (moderate): Secondary | ICD-10-CM | POA: Diagnosis not present

## 2016-08-22 DIAGNOSIS — Z7901 Long term (current) use of anticoagulants: Secondary | ICD-10-CM | POA: Diagnosis not present

## 2016-08-22 DIAGNOSIS — G8929 Other chronic pain: Secondary | ICD-10-CM | POA: Diagnosis not present

## 2016-08-22 DIAGNOSIS — Z87891 Personal history of nicotine dependence: Secondary | ICD-10-CM | POA: Diagnosis not present

## 2016-08-22 DIAGNOSIS — Z5181 Encounter for therapeutic drug level monitoring: Secondary | ICD-10-CM | POA: Diagnosis not present

## 2016-08-23 ENCOUNTER — Ambulatory Visit (INDEPENDENT_AMBULATORY_CARE_PROVIDER_SITE_OTHER): Payer: Medicare Other | Admitting: *Deleted

## 2016-08-23 DIAGNOSIS — I129 Hypertensive chronic kidney disease with stage 1 through stage 4 chronic kidney disease, or unspecified chronic kidney disease: Secondary | ICD-10-CM | POA: Diagnosis not present

## 2016-08-23 DIAGNOSIS — N183 Chronic kidney disease, stage 3 (moderate): Secondary | ICD-10-CM | POA: Diagnosis not present

## 2016-08-23 DIAGNOSIS — G8112 Spastic hemiplegia affecting left dominant side: Secondary | ICD-10-CM | POA: Diagnosis not present

## 2016-08-23 DIAGNOSIS — S069X9S Unspecified intracranial injury with loss of consciousness of unspecified duration, sequela: Secondary | ICD-10-CM | POA: Diagnosis not present

## 2016-08-23 DIAGNOSIS — M549 Dorsalgia, unspecified: Secondary | ICD-10-CM | POA: Diagnosis not present

## 2016-08-23 DIAGNOSIS — Z7901 Long term (current) use of anticoagulants: Secondary | ICD-10-CM

## 2016-08-23 DIAGNOSIS — G8929 Other chronic pain: Secondary | ICD-10-CM | POA: Diagnosis not present

## 2016-08-23 LAB — POCT INR: INR: 2.5

## 2016-08-24 DIAGNOSIS — M549 Dorsalgia, unspecified: Secondary | ICD-10-CM | POA: Diagnosis not present

## 2016-08-24 DIAGNOSIS — I129 Hypertensive chronic kidney disease with stage 1 through stage 4 chronic kidney disease, or unspecified chronic kidney disease: Secondary | ICD-10-CM | POA: Diagnosis not present

## 2016-08-24 DIAGNOSIS — N183 Chronic kidney disease, stage 3 (moderate): Secondary | ICD-10-CM | POA: Diagnosis not present

## 2016-08-24 DIAGNOSIS — S069X9S Unspecified intracranial injury with loss of consciousness of unspecified duration, sequela: Secondary | ICD-10-CM | POA: Diagnosis not present

## 2016-08-24 DIAGNOSIS — G8929 Other chronic pain: Secondary | ICD-10-CM | POA: Diagnosis not present

## 2016-08-24 DIAGNOSIS — G8112 Spastic hemiplegia affecting left dominant side: Secondary | ICD-10-CM | POA: Diagnosis not present

## 2016-08-28 DIAGNOSIS — G8112 Spastic hemiplegia affecting left dominant side: Secondary | ICD-10-CM | POA: Diagnosis not present

## 2016-08-28 DIAGNOSIS — G8929 Other chronic pain: Secondary | ICD-10-CM | POA: Diagnosis not present

## 2016-08-28 DIAGNOSIS — I129 Hypertensive chronic kidney disease with stage 1 through stage 4 chronic kidney disease, or unspecified chronic kidney disease: Secondary | ICD-10-CM | POA: Diagnosis not present

## 2016-08-28 DIAGNOSIS — S069X9S Unspecified intracranial injury with loss of consciousness of unspecified duration, sequela: Secondary | ICD-10-CM | POA: Diagnosis not present

## 2016-08-28 DIAGNOSIS — N183 Chronic kidney disease, stage 3 (moderate): Secondary | ICD-10-CM | POA: Diagnosis not present

## 2016-08-28 DIAGNOSIS — M549 Dorsalgia, unspecified: Secondary | ICD-10-CM | POA: Diagnosis not present

## 2016-08-29 DIAGNOSIS — I129 Hypertensive chronic kidney disease with stage 1 through stage 4 chronic kidney disease, or unspecified chronic kidney disease: Secondary | ICD-10-CM | POA: Diagnosis not present

## 2016-08-29 DIAGNOSIS — G8112 Spastic hemiplegia affecting left dominant side: Secondary | ICD-10-CM | POA: Diagnosis not present

## 2016-08-29 DIAGNOSIS — N183 Chronic kidney disease, stage 3 (moderate): Secondary | ICD-10-CM | POA: Diagnosis not present

## 2016-08-29 DIAGNOSIS — G8929 Other chronic pain: Secondary | ICD-10-CM | POA: Diagnosis not present

## 2016-08-29 DIAGNOSIS — S069X9S Unspecified intracranial injury with loss of consciousness of unspecified duration, sequela: Secondary | ICD-10-CM | POA: Diagnosis not present

## 2016-08-29 DIAGNOSIS — M549 Dorsalgia, unspecified: Secondary | ICD-10-CM | POA: Diagnosis not present

## 2016-08-30 DIAGNOSIS — I129 Hypertensive chronic kidney disease with stage 1 through stage 4 chronic kidney disease, or unspecified chronic kidney disease: Secondary | ICD-10-CM | POA: Diagnosis not present

## 2016-08-30 DIAGNOSIS — G8929 Other chronic pain: Secondary | ICD-10-CM | POA: Diagnosis not present

## 2016-08-30 DIAGNOSIS — N183 Chronic kidney disease, stage 3 (moderate): Secondary | ICD-10-CM | POA: Diagnosis not present

## 2016-08-30 DIAGNOSIS — S069X9S Unspecified intracranial injury with loss of consciousness of unspecified duration, sequela: Secondary | ICD-10-CM | POA: Diagnosis not present

## 2016-08-30 DIAGNOSIS — M549 Dorsalgia, unspecified: Secondary | ICD-10-CM | POA: Diagnosis not present

## 2016-08-30 DIAGNOSIS — G8112 Spastic hemiplegia affecting left dominant side: Secondary | ICD-10-CM | POA: Diagnosis not present

## 2016-08-31 DIAGNOSIS — S069X9S Unspecified intracranial injury with loss of consciousness of unspecified duration, sequela: Secondary | ICD-10-CM | POA: Diagnosis not present

## 2016-08-31 DIAGNOSIS — G8929 Other chronic pain: Secondary | ICD-10-CM | POA: Diagnosis not present

## 2016-08-31 DIAGNOSIS — N183 Chronic kidney disease, stage 3 (moderate): Secondary | ICD-10-CM | POA: Diagnosis not present

## 2016-08-31 DIAGNOSIS — I129 Hypertensive chronic kidney disease with stage 1 through stage 4 chronic kidney disease, or unspecified chronic kidney disease: Secondary | ICD-10-CM | POA: Diagnosis not present

## 2016-08-31 DIAGNOSIS — M549 Dorsalgia, unspecified: Secondary | ICD-10-CM | POA: Diagnosis not present

## 2016-08-31 DIAGNOSIS — G8112 Spastic hemiplegia affecting left dominant side: Secondary | ICD-10-CM | POA: Diagnosis not present

## 2016-09-04 ENCOUNTER — Encounter: Payer: Self-pay | Admitting: Physical Medicine & Rehabilitation

## 2016-09-04 ENCOUNTER — Encounter: Payer: Medicare Other | Attending: Physical Medicine & Rehabilitation | Admitting: Physical Medicine & Rehabilitation

## 2016-09-04 VITALS — BP 114/69 | HR 73

## 2016-09-04 DIAGNOSIS — E7800 Pure hypercholesterolemia, unspecified: Secondary | ICD-10-CM | POA: Diagnosis not present

## 2016-09-04 DIAGNOSIS — G8114 Spastic hemiplegia affecting left nondominant side: Secondary | ICD-10-CM | POA: Diagnosis not present

## 2016-09-04 DIAGNOSIS — S069X0S Unspecified intracranial injury without loss of consciousness, sequela: Secondary | ICD-10-CM | POA: Diagnosis not present

## 2016-09-04 DIAGNOSIS — G839 Paralytic syndrome, unspecified: Secondary | ICD-10-CM | POA: Insufficient documentation

## 2016-09-04 DIAGNOSIS — M479 Spondylosis, unspecified: Secondary | ICD-10-CM | POA: Diagnosis not present

## 2016-09-04 DIAGNOSIS — M545 Low back pain: Secondary | ICD-10-CM | POA: Diagnosis not present

## 2016-09-04 DIAGNOSIS — N183 Chronic kidney disease, stage 3 (moderate): Secondary | ICD-10-CM | POA: Diagnosis not present

## 2016-09-04 DIAGNOSIS — M19032 Primary osteoarthritis, left wrist: Secondary | ICD-10-CM | POA: Diagnosis not present

## 2016-09-04 DIAGNOSIS — S069X4S Unspecified intracranial injury with loss of consciousness of 6 hours to 24 hours, sequela: Secondary | ICD-10-CM | POA: Diagnosis not present

## 2016-09-04 DIAGNOSIS — I129 Hypertensive chronic kidney disease with stage 1 through stage 4 chronic kidney disease, or unspecified chronic kidney disease: Secondary | ICD-10-CM | POA: Insufficient documentation

## 2016-09-04 DIAGNOSIS — Z87891 Personal history of nicotine dependence: Secondary | ICD-10-CM | POA: Diagnosis not present

## 2016-09-04 DIAGNOSIS — W3400XS Accidental discharge from unspecified firearms or gun, sequela: Secondary | ICD-10-CM | POA: Diagnosis not present

## 2016-09-04 DIAGNOSIS — I69114 Frontal lobe and executive function deficit following nontraumatic intracerebral hemorrhage: Secondary | ICD-10-CM | POA: Diagnosis not present

## 2016-09-04 NOTE — Patient Instructions (Addendum)
WORK ON POSTURE AND STRENGTHENING YOUR BACK MUSCLES. IMPROVE FLEXIBILITY AND POSTURE WHEN YOU CAN   PLEASE FEEL FREE TO CALL OUR OFFICE WITH ANY PROBLEMS OR QUESTIONS ([phone])
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
## 2016-09-07 DIAGNOSIS — G8112 Spastic hemiplegia affecting left dominant side: Secondary | ICD-10-CM | POA: Diagnosis not present

## 2016-09-07 DIAGNOSIS — G8929 Other chronic pain: Secondary | ICD-10-CM | POA: Diagnosis not present

## 2016-09-07 DIAGNOSIS — I129 Hypertensive chronic kidney disease with stage 1 through stage 4 chronic kidney disease, or unspecified chronic kidney disease: Secondary | ICD-10-CM | POA: Diagnosis not present

## 2016-09-07 DIAGNOSIS — S069X9S Unspecified intracranial injury with loss of consciousness of unspecified duration, sequela: Secondary | ICD-10-CM | POA: Diagnosis not present

## 2016-09-07 DIAGNOSIS — N183 Chronic kidney disease, stage 3 (moderate): Secondary | ICD-10-CM | POA: Diagnosis not present

## 2016-09-07 DIAGNOSIS — M549 Dorsalgia, unspecified: Secondary | ICD-10-CM | POA: Diagnosis not present

## 2016-09-07 LAB — POCT INR: INR: 2.2

## 2016-09-08 ENCOUNTER — Ambulatory Visit (INDEPENDENT_AMBULATORY_CARE_PROVIDER_SITE_OTHER): Payer: Medicare Other

## 2016-09-08 DIAGNOSIS — Z7901 Long term (current) use of anticoagulants: Secondary | ICD-10-CM

## 2016-09-11 ENCOUNTER — Other Ambulatory Visit: Payer: Self-pay | Admitting: *Deleted

## 2016-09-11 ENCOUNTER — Other Ambulatory Visit: Payer: Self-pay | Admitting: Internal Medicine

## 2016-09-11 MED ORDER — WARFARIN SODIUM 5 MG PO TABS: ORAL_TABLET | ORAL | 4 refills | Status: DC

## 2016-09-11 NOTE — Telephone Encounter (Signed)
Please refill.

## 2016-09-22 DIAGNOSIS — M549 Dorsalgia, unspecified: Secondary | ICD-10-CM | POA: Diagnosis not present

## 2016-09-22 DIAGNOSIS — N183 Chronic kidney disease, stage 3 (moderate): Secondary | ICD-10-CM | POA: Diagnosis not present

## 2016-09-22 DIAGNOSIS — S069X9S Unspecified intracranial injury with loss of consciousness of unspecified duration, sequela: Secondary | ICD-10-CM | POA: Diagnosis not present

## 2016-09-22 DIAGNOSIS — G8929 Other chronic pain: Secondary | ICD-10-CM | POA: Diagnosis not present

## 2016-09-22 DIAGNOSIS — G8112 Spastic hemiplegia affecting left dominant side: Secondary | ICD-10-CM | POA: Diagnosis not present

## 2016-09-22 DIAGNOSIS — I129 Hypertensive chronic kidney disease with stage 1 through stage 4 chronic kidney disease, or unspecified chronic kidney disease: Secondary | ICD-10-CM | POA: Diagnosis not present

## 2016-09-27 ENCOUNTER — Ambulatory Visit (INDEPENDENT_AMBULATORY_CARE_PROVIDER_SITE_OTHER): Payer: Medicare Other | Admitting: *Deleted

## 2016-09-27 DIAGNOSIS — I480 Paroxysmal atrial fibrillation: Secondary | ICD-10-CM | POA: Diagnosis not present

## 2016-09-27 DIAGNOSIS — Z5181 Encounter for therapeutic drug level monitoring: Secondary | ICD-10-CM

## 2016-09-27 DIAGNOSIS — Z7901 Long term (current) use of anticoagulants: Secondary | ICD-10-CM

## 2016-09-27 LAB — POCT INR: INR: 2.2

## 2016-10-04 DIAGNOSIS — I129 Hypertensive chronic kidney disease with stage 1 through stage 4 chronic kidney disease, or unspecified chronic kidney disease: Secondary | ICD-10-CM | POA: Diagnosis not present

## 2016-10-04 DIAGNOSIS — N183 Chronic kidney disease, stage 3 (moderate): Secondary | ICD-10-CM | POA: Diagnosis not present

## 2016-10-04 DIAGNOSIS — G8112 Spastic hemiplegia affecting left dominant side: Secondary | ICD-10-CM | POA: Diagnosis not present

## 2016-10-04 DIAGNOSIS — M549 Dorsalgia, unspecified: Secondary | ICD-10-CM | POA: Diagnosis not present

## 2016-10-04 DIAGNOSIS — S069X9S Unspecified intracranial injury with loss of consciousness of unspecified duration, sequela: Secondary | ICD-10-CM | POA: Diagnosis not present

## 2016-10-04 DIAGNOSIS — G8929 Other chronic pain: Secondary | ICD-10-CM | POA: Diagnosis not present

## 2016-10-25 ENCOUNTER — Ambulatory Visit (INDEPENDENT_AMBULATORY_CARE_PROVIDER_SITE_OTHER): Payer: Medicare Other | Admitting: *Deleted

## 2016-10-25 DIAGNOSIS — I480 Paroxysmal atrial fibrillation: Secondary | ICD-10-CM

## 2016-10-25 DIAGNOSIS — Z5181 Encounter for therapeutic drug level monitoring: Secondary | ICD-10-CM | POA: Diagnosis not present

## 2016-10-25 DIAGNOSIS — Z7901 Long term (current) use of anticoagulants: Secondary | ICD-10-CM

## 2016-10-25 LAB — POCT INR: INR: 2.5

## 2016-10-25 MED ORDER — WARFARIN SODIUM 5 MG PO TABS: ORAL_TABLET | ORAL | 4 refills | Status: DC

## 2016-11-08 ENCOUNTER — Encounter: Payer: Self-pay | Admitting: Family Medicine

## 2016-11-08 ENCOUNTER — Ambulatory Visit (INDEPENDENT_AMBULATORY_CARE_PROVIDER_SITE_OTHER): Payer: Medicare Other | Admitting: Family Medicine

## 2016-11-08 VITALS — BP 130/80 | HR 70 | Ht 66.0 in

## 2016-11-08 DIAGNOSIS — K4090 Unilateral inguinal hernia, without obstruction or gangrene, not specified as recurrent: Secondary | ICD-10-CM

## 2016-11-08 DIAGNOSIS — M549 Dorsalgia, unspecified: Secondary | ICD-10-CM

## 2016-11-08 MED ORDER — PREDNISONE 20 MG PO TABS: 20.0000 mg | ORAL_TABLET | Freq: Every day | ORAL | 0 refills | Status: DC

## 2016-11-08 MED ORDER — GABAPENTIN 100 MG PO CAPS: 200.0000 mg | ORAL_CAPSULE | Freq: Every day | ORAL | 3 refills | Status: DC

## 2016-11-08 NOTE — Assessment & Plan Note (Signed)
Patient does have a right-sided inguinal hernia. Patient will be referred to Gen. surgery for further evaluation. Patient is a high risk surgical candidate. Patient does have underlying arthritis but likely not contribute in. Also has lumbar radiculopathy. Has responded fairly well to prednisone in the past one give him some now. We discussed worsening symptoms though patient should go to the emergency room immediately. Especially any nausea vomiting or change in bowel movement. Follow-up again as needed.

## 2016-11-08 NOTE — Patient Instructions (Signed)
oo dot see yo u Sorry for the bad news I know you have arthritis of the hip and back but I think it is a hernia We will get you to see general surgery to discuss your options but it may be difficult for them to do too much Prednisone daily for 5 days to help with a little of the pain  If pain gets significantly worse you need to go to emergency room.  Refilled gabapentin  See me again when you need me.
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
## 2016-11-21 DIAGNOSIS — R1031 Right lower quadrant pain: Secondary | ICD-10-CM | POA: Diagnosis not present

## 2016-11-22 ENCOUNTER — Encounter: Payer: Self-pay | Admitting: Internal Medicine

## 2016-11-22 ENCOUNTER — Ambulatory Visit (INDEPENDENT_AMBULATORY_CARE_PROVIDER_SITE_OTHER): Payer: Medicare Other | Admitting: Internal Medicine

## 2016-11-22 VITALS — BP 126/72 | HR 70 | Temp 97.9°F | Ht 66.0 in | Wt 194.0 lb

## 2016-11-22 DIAGNOSIS — R05 Cough: Secondary | ICD-10-CM

## 2016-11-22 DIAGNOSIS — N183 Chronic kidney disease, stage 3 unspecified: Secondary | ICD-10-CM

## 2016-11-22 DIAGNOSIS — I10 Essential (primary) hypertension: Secondary | ICD-10-CM | POA: Diagnosis not present

## 2016-11-22 DIAGNOSIS — R059 Cough, unspecified: Secondary | ICD-10-CM

## 2016-11-22 MED ORDER — LOSARTAN POTASSIUM-HCTZ 50-12.5 MG PO TABS: 1.0000 | ORAL_TABLET | Freq: Every day | ORAL | 3 refills | Status: DC

## 2016-11-22 NOTE — Patient Instructions (Signed)
Ok to stop the Lisinopril-HCT medication  Please take all new medication as prescribed - the losartan HCT 50-12.5 mg (which works about the same even though the numbers are different)  Please continue all other medications as before, and refills have been done if requested.  Please have the pharmacy call with any other refills you may need.  Please keep your appointments with your specialists as you may have planned  Please return in 6 months, or sooner if needed
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
## 2016-11-24 ENCOUNTER — Other Ambulatory Visit: Payer: Self-pay | Admitting: General Surgery

## 2016-11-24 DIAGNOSIS — R059 Cough, unspecified: Secondary | ICD-10-CM | POA: Insufficient documentation

## 2016-11-24 DIAGNOSIS — R05 Cough: Secondary | ICD-10-CM | POA: Insufficient documentation

## 2016-11-24 DIAGNOSIS — R1031 Right lower quadrant pain: Secondary | ICD-10-CM

## 2016-11-24 NOTE — Assessment & Plan Note (Signed)
Exam benign, ok to try trial off ACE,  to f/u any worsening symptoms or concerns, consider cxr if not improved

## 2016-11-24 NOTE — Assessment & Plan Note (Signed)
Lab Results  Component Value Date   CREATININE 1.43 (H) 07/28/2016  stable overall by history and exam, recent data reviewed with pt, and pt to continue medical treatment as before,  to f/u any worsening symptoms or concerns

## 2016-11-24 NOTE — Assessment & Plan Note (Signed)
Mild uncontrolled, for change med to losartan HCT 50/12.5 mg prn,  to f/u any worsening symptoms or concerns

## 2016-12-01 ENCOUNTER — Ambulatory Visit
Admission: RE | Admit: 2016-12-01 | Discharge: 2016-12-01 | Disposition: A | Discharge status: Home / Self Care | Payer: Medicare Other | Source: Ambulatory Visit | Attending: General Surgery | Admitting: General Surgery

## 2016-12-01 DIAGNOSIS — R1030 Lower abdominal pain, unspecified: Secondary | ICD-10-CM | POA: Diagnosis not present

## 2016-12-01 DIAGNOSIS — R1031 Right lower quadrant pain: Secondary | ICD-10-CM

## 2016-12-06 ENCOUNTER — Ambulatory Visit (INDEPENDENT_AMBULATORY_CARE_PROVIDER_SITE_OTHER): Payer: Medicare Other | Admitting: *Deleted

## 2016-12-06 DIAGNOSIS — I480 Paroxysmal atrial fibrillation: Secondary | ICD-10-CM | POA: Diagnosis not present

## 2016-12-06 DIAGNOSIS — Z7901 Long term (current) use of anticoagulants: Secondary | ICD-10-CM | POA: Diagnosis not present

## 2016-12-06 LAB — POCT INR: INR: 3

## 2016-12-19 DIAGNOSIS — H4720 Unspecified optic atrophy: Secondary | ICD-10-CM | POA: Diagnosis not present

## 2016-12-19 DIAGNOSIS — H26491 Other secondary cataract, right eye: Secondary | ICD-10-CM | POA: Diagnosis not present

## 2016-12-19 DIAGNOSIS — Z961 Presence of intraocular lens: Secondary | ICD-10-CM | POA: Diagnosis not present

## 2016-12-19 DIAGNOSIS — H43813 Vitreous degeneration, bilateral: Secondary | ICD-10-CM | POA: Diagnosis not present

## 2017-01-08 ENCOUNTER — Ambulatory Visit: Payer: Self-pay | Admitting: *Deleted

## 2017-01-08 NOTE — Telephone Encounter (Signed)
  Reason for Disposition . [1] MODERATE back pain (e.g., interferes with normal activities) AND [2] present > 3 days  Answer Assessment - Initial Assessment Questions 1. ONSET: "When did the pain begin?"      Over the weekend 2. LOCATION: "Where does it hurt?" (upper, mid or lower back)     Lower 3. SEVERITY: "How bad is the pain?"  (e.g., Scale 1-10; mild, moderate, or severe)   - MILD (1-3): doesn't interfere with normal activities    - MODERATE (4-7): interferes with normal activities or awakens from sleep    - SEVERE (8-10): excruciating pain, unable to do any normal activities      7 4. PATTERN: "Is the pain constant?" (e.g., yes, no; constant, intermittent)      Come and go as he moves around 5. RADIATION: "Does the pain shoot into your legs or elsewhere?"     Shoots into left leg 6. CAUSE:  "What do you think is causing the back pain?"      Pt sometimes has flare ups that causes the pain to be worse 7. BACK OVERUSE:  "Any recent lifting of heavy objects, strenuous work or exercise?"     No 8. MEDICATIONS: "What have you taken so far for the pain?" (e.g., nothing, acetaminophen, NSAIDS)     Tylenol for arthritis and gabapentin 9. NEUROLOGIC SYMPTOMS: "Do you have any weakness, numbness, or problems with bowel/bladder control?"     Numbness to right leg in hip area, regardless to pain, not a new symptom 10. OTHER SYMPTOMS: "Do you have any other symptoms?" (e.g., fever, abdominal pain, burning with urination, blood in urine)       No  Protocols used: BACK PAIN-A-AH  Pt's wife calling stating that the pt had a flare up of back pain and had been experiencing increased pain over the weekend.[name] being seen by Dr. Tamala Julian to be treated for nerve pain is is currently on Gabapentin. Pt's wife states he was prescribed Hydrocodone and Flexeril from the hospital and wanted to know if he could take these medications to help with the pain. Pt's wife advised that he could take the medications,  but not at the same time to see if the pt could get some relief until appt on Thursday, 12/20.Pt's wife stating that that the pt had medications prescribed from the hospital previously.

## 2017-01-10 ENCOUNTER — Ambulatory Visit (INDEPENDENT_AMBULATORY_CARE_PROVIDER_SITE_OTHER): Payer: Medicare Other | Admitting: *Deleted

## 2017-01-10 DIAGNOSIS — I480 Paroxysmal atrial fibrillation: Secondary | ICD-10-CM | POA: Diagnosis not present

## 2017-01-10 DIAGNOSIS — Z7901 Long term (current) use of anticoagulants: Secondary | ICD-10-CM

## 2017-01-10 LAB — POCT INR: INR: 3.4

## 2017-01-11 ENCOUNTER — Encounter: Payer: Self-pay | Admitting: Family Medicine

## 2017-01-11 ENCOUNTER — Ambulatory Visit (INDEPENDENT_AMBULATORY_CARE_PROVIDER_SITE_OTHER): Payer: Medicare Other | Admitting: Family Medicine

## 2017-01-11 DIAGNOSIS — M5416 Radiculopathy, lumbar region: Secondary | ICD-10-CM

## 2017-01-11 MED ORDER — GABAPENTIN 300 MG PO CAPS
ORAL_CAPSULE | ORAL | 3 refills | Status: DC
Start: 2017-01-11 — End: 2017-04-27

## 2017-01-11 MED ORDER — PREDNISONE 50 MG PO TABS: 50.0000 mg | ORAL_TABLET | Freq: Every day | ORAL | 0 refills | Status: DC

## 2017-01-11 NOTE — Assessment & Plan Note (Signed)
I believe the patient is having worsening lumbar radiculopathy.  We discussed with patient again at great length discussed icing regimen and home exercises.  Discussed which activities of doing which wants to avoid.  Patient given prednisone again and we increase gabapentin to 300 mg at night.  We discussed the possibility of injections with patient declined.  Follow-up with me again in 3 weeks

## 2017-01-11 NOTE — Patient Instructions (Signed)
Good to see you  Increase gabapentin to 300 mg at night Prednisone daily for 5 days  Ice is your friend.  Try the brace at night Keep working with your PT  See me again in 3 weeks
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
## 2017-02-01 ENCOUNTER — Ambulatory Visit (INDEPENDENT_AMBULATORY_CARE_PROVIDER_SITE_OTHER): Payer: Medicare Other | Admitting: Family Medicine

## 2017-02-01 ENCOUNTER — Encounter: Payer: Self-pay | Admitting: Family Medicine

## 2017-02-01 DIAGNOSIS — M5416 Radiculopathy, lumbar region: Secondary | ICD-10-CM

## 2017-02-01 MED ORDER — GABAPENTIN 100 MG PO CAPS
100.0000 mg | ORAL_CAPSULE | Freq: Two times a day (BID) | ORAL | 3 refills | Status: DC
Start: 2017-02-01 — End: 2017-04-27

## 2017-02-01 NOTE — Assessment & Plan Note (Signed)
Still believe that likely some of this is more secondary to lumbar radiculopathy.  Patient does not want any type of injections.  We discussed with patient about icing regimen, home exercises, topical anti-inflammatories and increasing gabapentin.  Discussed that there is a possibility that this could be also just going from muscle atrophy with the revision.  Patient will follow-up again in 3-4 weeks
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
## 2017-02-01 NOTE — Patient Instructions (Signed)
Good to see you  Austin [name] is your friend.  Gabapentin 100mg  in AM, 100mg  in PM and 300mg  at night Consider tylenol 2 times a day scheduled.  CoQ10 400mg  daily  See me again in 3 weeks

## 2017-02-07 ENCOUNTER — Ambulatory Visit (INDEPENDENT_AMBULATORY_CARE_PROVIDER_SITE_OTHER): Payer: Medicare Other | Admitting: *Deleted

## 2017-02-07 DIAGNOSIS — Z7901 Long term (current) use of anticoagulants: Secondary | ICD-10-CM | POA: Diagnosis not present

## 2017-02-07 DIAGNOSIS — I480 Paroxysmal atrial fibrillation: Secondary | ICD-10-CM | POA: Diagnosis not present

## 2017-02-07 LAB — POCT INR: INR: 2.8

## 2017-02-07 NOTE — Patient Instructions (Signed)
Continue coumadin 1 1/2 tablets daily except 1 tablet on Sundays and Wednesdays. Recheck in 4 weeks.

## 2017-02-22 ENCOUNTER — Other Ambulatory Visit: Payer: Self-pay | Admitting: Internal Medicine
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
## 2017-02-26 ENCOUNTER — Ambulatory Visit (INDEPENDENT_AMBULATORY_CARE_PROVIDER_SITE_OTHER): Payer: Medicare Other | Admitting: Family Medicine

## 2017-02-26 ENCOUNTER — Encounter: Payer: Self-pay | Admitting: Family Medicine

## 2017-02-26 DIAGNOSIS — M7062 Trochanteric bursitis, left hip: Secondary | ICD-10-CM | POA: Diagnosis not present

## 2017-02-26 DIAGNOSIS — M5416 Radiculopathy, lumbar region: Secondary | ICD-10-CM

## 2017-02-26 NOTE — Patient Instructions (Signed)
Good to see you Austin [name] is your friend.  pennsaid pinkie amount topically 2 times daily as needed.  If the hip gets worse let me try an injection  Continue the gabapentin  Feet higher then your heart to help the swelling See me when you need me

## 2017-02-26 NOTE — Assessment & Plan Note (Signed)
Seems to be doing better overall.  Continues to have more of a greater trochanteric bursitis.  We discussed icing regimen and home exercises.  Discussed which activities to do which will avoid.  Increase activity slowly over the course the next several days.  Follow-up with me again in 4-6 weeks

## 2017-03-07 ENCOUNTER — Encounter: Payer: Self-pay | Admitting: Physical Medicine & Rehabilitation

## 2017-03-07 ENCOUNTER — Encounter: Payer: Medicare Other | Attending: Physical Medicine & Rehabilitation | Admitting: Physical Medicine & Rehabilitation

## 2017-03-07 VITALS — BP 107/51 | HR 66

## 2017-03-07 DIAGNOSIS — Z87891 Personal history of nicotine dependence: Secondary | ICD-10-CM | POA: Insufficient documentation

## 2017-03-07 DIAGNOSIS — M719 Bursopathy, unspecified: Secondary | ICD-10-CM | POA: Insufficient documentation

## 2017-03-07 DIAGNOSIS — M19072 Primary osteoarthritis, left ankle and foot: Secondary | ICD-10-CM | POA: Insufficient documentation

## 2017-03-07 DIAGNOSIS — M1712 Unilateral primary osteoarthritis, left knee: Secondary | ICD-10-CM | POA: Insufficient documentation

## 2017-03-07 DIAGNOSIS — R252 Cramp and spasm: Secondary | ICD-10-CM | POA: Diagnosis not present

## 2017-03-07 DIAGNOSIS — E7800 Pure hypercholesterolemia, unspecified: Secondary | ICD-10-CM | POA: Insufficient documentation

## 2017-03-07 DIAGNOSIS — M79645 Pain in left finger(s): Secondary | ICD-10-CM | POA: Diagnosis not present

## 2017-03-07 DIAGNOSIS — Z801 Family history of malignant neoplasm of trachea, bronchus and lung: Secondary | ICD-10-CM | POA: Diagnosis not present

## 2017-03-07 DIAGNOSIS — G8114 Spastic hemiplegia affecting left nondominant side: Secondary | ICD-10-CM | POA: Diagnosis not present

## 2017-03-07 DIAGNOSIS — M549 Dorsalgia, unspecified: Secondary | ICD-10-CM | POA: Diagnosis not present

## 2017-03-07 DIAGNOSIS — Z9889 Other specified postprocedural states: Secondary | ICD-10-CM | POA: Diagnosis not present

## 2017-03-07 DIAGNOSIS — M7502 Adhesive capsulitis of left shoulder: Secondary | ICD-10-CM | POA: Diagnosis not present

## 2017-03-07 DIAGNOSIS — Z8261 Family history of arthritis: Secondary | ICD-10-CM | POA: Diagnosis not present

## 2017-03-07 DIAGNOSIS — Z8249 Family history of ischemic heart disease and other diseases of the circulatory system: Secondary | ICD-10-CM | POA: Insufficient documentation

## 2017-03-07 DIAGNOSIS — S069X4S Unspecified intracranial injury with loss of consciousness of 6 hours to 24 hours, sequela: Secondary | ICD-10-CM | POA: Diagnosis not present

## 2017-03-07 DIAGNOSIS — Z8546 Personal history of malignant neoplasm of prostate: Secondary | ICD-10-CM | POA: Insufficient documentation

## 2017-03-07 DIAGNOSIS — M19071 Primary osteoarthritis, right ankle and foot: Secondary | ICD-10-CM | POA: Diagnosis not present

## 2017-03-07 DIAGNOSIS — W3400XS Accidental discharge from unspecified firearms or gun, sequela: Secondary | ICD-10-CM | POA: Insufficient documentation

## 2017-03-07 DIAGNOSIS — N183 Chronic kidney disease, stage 3 (moderate): Secondary | ICD-10-CM | POA: Insufficient documentation

## 2017-03-07 DIAGNOSIS — I129 Hypertensive chronic kidney disease with stage 1 through stage 4 chronic kidney disease, or unspecified chronic kidney disease: Secondary | ICD-10-CM | POA: Diagnosis not present

## 2017-03-07 NOTE — Patient Instructions (Signed)
PLEASE FEEL FREE TO CALL OUR OFFICE WITH ANY PROBLEMS OR QUESTIONS ([phone])      
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
## 2017-03-12 ENCOUNTER — Ambulatory Visit (INDEPENDENT_AMBULATORY_CARE_PROVIDER_SITE_OTHER): Payer: Medicare Other | Admitting: *Deleted

## 2017-03-12 DIAGNOSIS — I480 Paroxysmal atrial fibrillation: Secondary | ICD-10-CM

## 2017-03-12 DIAGNOSIS — Z7901 Long term (current) use of anticoagulants: Secondary | ICD-10-CM

## 2017-03-12 LAB — POCT INR: INR: 1.8

## 2017-03-12 NOTE — Patient Instructions (Signed)
Take 2 tablets tonight and tomorrow night then resume 1 1/2 tablets daily except 1 tablet on Sundays and Wednesdays. Recheck in 3 weeks.

## 2017-03-14 ENCOUNTER — Ambulatory Visit: Payer: Medicare Other | Admitting: Internal Medicine

## 2017-04-02 ENCOUNTER — Ambulatory Visit (INDEPENDENT_AMBULATORY_CARE_PROVIDER_SITE_OTHER): Payer: Medicare Other | Admitting: *Deleted

## 2017-04-02 DIAGNOSIS — I480 Paroxysmal atrial fibrillation: Secondary | ICD-10-CM | POA: Diagnosis not present

## 2017-04-02 DIAGNOSIS — Z7901 Long term (current) use of anticoagulants: Secondary | ICD-10-CM | POA: Diagnosis not present

## 2017-04-02 LAB — POCT INR: INR: 2.9

## 2017-04-02 NOTE — Patient Instructions (Signed)
Continue coumadin 1 1/2 tablets daily except 1 tablet on Sundays and Wednesdays. Recheck in 4 weeks.

## 2017-04-09 ENCOUNTER — Encounter (HOSPITAL_COMMUNITY): Payer: Self-pay | Admitting: Emergency Medicine

## 2017-04-09 ENCOUNTER — Emergency Department (HOSPITAL_COMMUNITY): Payer: Medicare Other

## 2017-04-09 ENCOUNTER — Ambulatory Visit: Payer: Self-pay | Admitting: *Deleted

## 2017-04-09 ENCOUNTER — Inpatient Hospital Stay (HOSPITAL_COMMUNITY)
Admission: EM | Admit: 2017-04-09 | Discharge: 2017-04-27 | DRG: 871 | Disposition: A | Discharge status: Skilled Nursing Facility | Payer: Medicare Other | Attending: Family Medicine | Admitting: Family Medicine

## 2017-04-09 DIAGNOSIS — E872 Acidosis: Secondary | ICD-10-CM | POA: Diagnosis present

## 2017-04-09 DIAGNOSIS — G9340 Encephalopathy, unspecified: Secondary | ICD-10-CM | POA: Diagnosis present

## 2017-04-09 DIAGNOSIS — Z931 Gastrostomy status: Secondary | ICD-10-CM

## 2017-04-09 DIAGNOSIS — R410 Disorientation, unspecified: Secondary | ICD-10-CM | POA: Diagnosis not present

## 2017-04-09 DIAGNOSIS — R7881 Bacteremia: Secondary | ICD-10-CM | POA: Diagnosis not present

## 2017-04-09 DIAGNOSIS — R061 Stridor: Secondary | ICD-10-CM | POA: Diagnosis not present

## 2017-04-09 DIAGNOSIS — D6959 Other secondary thrombocytopenia: Secondary | ICD-10-CM | POA: Diagnosis present

## 2017-04-09 DIAGNOSIS — J9601 Acute respiratory failure with hypoxia: Secondary | ICD-10-CM | POA: Diagnosis not present

## 2017-04-09 DIAGNOSIS — I69354 Hemiplegia and hemiparesis following cerebral infarction affecting left non-dominant side: Secondary | ICD-10-CM | POA: Diagnosis not present

## 2017-04-09 DIAGNOSIS — Z79899 Other long term (current) drug therapy: Secondary | ICD-10-CM

## 2017-04-09 DIAGNOSIS — R6520 Severe sepsis without septic shock: Secondary | ICD-10-CM | POA: Diagnosis not present

## 2017-04-09 DIAGNOSIS — D62 Acute posthemorrhagic anemia: Secondary | ICD-10-CM | POA: Diagnosis present

## 2017-04-09 DIAGNOSIS — K8020 Calculus of gallbladder without cholecystitis without obstruction: Secondary | ICD-10-CM | POA: Diagnosis not present

## 2017-04-09 DIAGNOSIS — H9191 Unspecified hearing loss, right ear: Secondary | ICD-10-CM | POA: Diagnosis present

## 2017-04-09 DIAGNOSIS — R1110 Vomiting, unspecified: Secondary | ICD-10-CM

## 2017-04-09 DIAGNOSIS — G8114 Spastic hemiplegia affecting left nondominant side: Secondary | ICD-10-CM | POA: Diagnosis present

## 2017-04-09 DIAGNOSIS — Z8701 Personal history of pneumonia (recurrent): Secondary | ICD-10-CM

## 2017-04-09 DIAGNOSIS — G9009 Other idiopathic peripheral autonomic neuropathy: Secondary | ICD-10-CM | POA: Diagnosis not present

## 2017-04-09 DIAGNOSIS — R1111 Vomiting without nausea: Secondary | ICD-10-CM | POA: Diagnosis not present

## 2017-04-09 DIAGNOSIS — R2681 Unsteadiness on feet: Secondary | ICD-10-CM | POA: Diagnosis not present

## 2017-04-09 DIAGNOSIS — R531 Weakness: Secondary | ICD-10-CM | POA: Diagnosis not present

## 2017-04-09 DIAGNOSIS — I130 Hypertensive heart and chronic kidney disease with heart failure and stage 1 through stage 4 chronic kidney disease, or unspecified chronic kidney disease: Secondary | ICD-10-CM | POA: Diagnosis present

## 2017-04-09 DIAGNOSIS — Z7901 Long term (current) use of anticoagulants: Secondary | ICD-10-CM | POA: Diagnosis not present

## 2017-04-09 DIAGNOSIS — M6281 Muscle weakness (generalized): Secondary | ICD-10-CM | POA: Diagnosis not present

## 2017-04-09 DIAGNOSIS — A419 Sepsis, unspecified organism: Secondary | ICD-10-CM

## 2017-04-09 DIAGNOSIS — I4510 Unspecified right bundle-branch block: Secondary | ICD-10-CM | POA: Diagnosis present

## 2017-04-09 DIAGNOSIS — I5042 Chronic combined systolic (congestive) and diastolic (congestive) heart failure: Secondary | ICD-10-CM | POA: Diagnosis present

## 2017-04-09 DIAGNOSIS — G629 Polyneuropathy, unspecified: Secondary | ICD-10-CM | POA: Diagnosis present

## 2017-04-09 DIAGNOSIS — R29810 Facial weakness: Secondary | ICD-10-CM | POA: Diagnosis not present

## 2017-04-09 DIAGNOSIS — N170 Acute kidney failure with tubular necrosis: Secondary | ICD-10-CM | POA: Diagnosis present

## 2017-04-09 DIAGNOSIS — J9602 Acute respiratory failure with hypercapnia: Secondary | ICD-10-CM | POA: Diagnosis present

## 2017-04-09 DIAGNOSIS — I959 Hypotension, unspecified: Secondary | ICD-10-CM

## 2017-04-09 DIAGNOSIS — G9389 Other specified disorders of brain: Secondary | ICD-10-CM | POA: Diagnosis not present

## 2017-04-09 DIAGNOSIS — Z01818 Encounter for other preprocedural examination: Secondary | ICD-10-CM | POA: Diagnosis not present

## 2017-04-09 DIAGNOSIS — K810 Acute cholecystitis: Secondary | ICD-10-CM | POA: Diagnosis not present

## 2017-04-09 DIAGNOSIS — R0602 Shortness of breath: Secondary | ICD-10-CM

## 2017-04-09 DIAGNOSIS — N179 Acute kidney failure, unspecified: Secondary | ICD-10-CM | POA: Diagnosis not present

## 2017-04-09 DIAGNOSIS — Z8249 Family history of ischemic heart disease and other diseases of the circulatory system: Secondary | ICD-10-CM

## 2017-04-09 DIAGNOSIS — Z87891 Personal history of nicotine dependence: Secondary | ICD-10-CM

## 2017-04-09 DIAGNOSIS — E162 Hypoglycemia, unspecified: Secondary | ICD-10-CM | POA: Diagnosis not present

## 2017-04-09 DIAGNOSIS — I4891 Unspecified atrial fibrillation: Secondary | ICD-10-CM | POA: Diagnosis not present

## 2017-04-09 DIAGNOSIS — N183 Chronic kidney disease, stage 3 (moderate): Secondary | ICD-10-CM | POA: Diagnosis not present

## 2017-04-09 DIAGNOSIS — K828 Other specified diseases of gallbladder: Secondary | ICD-10-CM | POA: Diagnosis present

## 2017-04-09 DIAGNOSIS — K811 Chronic cholecystitis: Secondary | ICD-10-CM | POA: Diagnosis not present

## 2017-04-09 DIAGNOSIS — A4151 Sepsis due to Escherichia coli [E. coli]: Principal | ICD-10-CM | POA: Diagnosis present

## 2017-04-09 DIAGNOSIS — K8000 Calculus of gallbladder with acute cholecystitis without obstruction: Secondary | ICD-10-CM | POA: Diagnosis present

## 2017-04-09 DIAGNOSIS — Z801 Family history of malignant neoplasm of trachea, bronchus and lung: Secondary | ICD-10-CM

## 2017-04-09 DIAGNOSIS — K859 Acute pancreatitis without necrosis or infection, unspecified: Secondary | ICD-10-CM | POA: Diagnosis not present

## 2017-04-09 DIAGNOSIS — J9600 Acute respiratory failure, unspecified whether with hypoxia or hypercapnia: Secondary | ICD-10-CM | POA: Diagnosis not present

## 2017-04-09 DIAGNOSIS — I480 Paroxysmal atrial fibrillation: Secondary | ICD-10-CM | POA: Diagnosis present

## 2017-04-09 DIAGNOSIS — R404 Transient alteration of awareness: Secondary | ICD-10-CM | POA: Diagnosis not present

## 2017-04-09 DIAGNOSIS — R740 Nonspecific elevation of levels of transaminase and lactic acid dehydrogenase [LDH]: Secondary | ICD-10-CM | POA: Diagnosis not present

## 2017-04-09 DIAGNOSIS — K9186 Retained cholelithiasis following cholecystectomy: Secondary | ICD-10-CM | POA: Diagnosis not present

## 2017-04-09 DIAGNOSIS — Z8546 Personal history of malignant neoplasm of prostate: Secondary | ICD-10-CM

## 2017-04-09 DIAGNOSIS — R112 Nausea with vomiting, unspecified: Secondary | ICD-10-CM | POA: Diagnosis not present

## 2017-04-09 DIAGNOSIS — I361 Nonrheumatic tricuspid (valve) insufficiency: Secondary | ICD-10-CM | POA: Diagnosis not present

## 2017-04-09 DIAGNOSIS — Z452 Encounter for adjustment and management of vascular access device: Secondary | ICD-10-CM | POA: Diagnosis not present

## 2017-04-09 DIAGNOSIS — R579 Shock, unspecified: Secondary | ICD-10-CM | POA: Diagnosis present

## 2017-04-09 DIAGNOSIS — R6521 Severe sepsis with septic shock: Secondary | ICD-10-CM | POA: Diagnosis not present

## 2017-04-09 DIAGNOSIS — E785 Hyperlipidemia, unspecified: Secondary | ICD-10-CM | POA: Diagnosis not present

## 2017-04-09 DIAGNOSIS — Z4682 Encounter for fitting and adjustment of non-vascular catheter: Secondary | ICD-10-CM | POA: Diagnosis not present

## 2017-04-09 LAB — CBC WITH DIFFERENTIAL/PLATELET
Basophils Absolute: 0 10*3/uL (ref 0.0–0.1)
Basophils Relative: 0 %
Eosinophils Absolute: 0 10*3/uL (ref 0.0–0.7)
Eosinophils Relative: 0 %
HCT: 42.5 % (ref 39.0–52.0)
Hemoglobin: 13.5 g/dL (ref 13.0–17.0)
Lymphocytes Relative: 5 %
Lymphs Abs: 0.3 10*3/uL — ABNORMAL LOW (ref 0.7–4.0)
MCH: 30.8 pg (ref 26.0–34.0)
MCHC: 31.8 g/dL (ref 30.0–36.0)
MCV: 96.8 fL (ref 78.0–100.0)
Monocytes Absolute: 0.1 10*3/uL (ref 0.1–1.0)
Monocytes Relative: 1 %
Neutro Abs: 5.9 10*3/uL (ref 1.7–7.7)
Neutrophils Relative %: 94 %
Platelets: 103 10*3/uL — ABNORMAL LOW (ref 150–400)
RBC: 4.39 MIL/uL (ref 4.22–5.81)
RDW: 14.9 % (ref 11.5–15.5)
WBC: 6.3 10*3/uL (ref 4.0–10.5)

## 2017-04-09 LAB — COMPREHENSIVE METABOLIC PANEL
ALT: 309 U/L — ABNORMAL HIGH (ref 17–63)
AST: 415 U/L — ABNORMAL HIGH (ref 15–41)
Albumin: 3.7 g/dL (ref 3.5–5.0)
Alkaline Phosphatase: 266 U/L — ABNORMAL HIGH (ref 38–126)
Anion gap: 18 — ABNORMAL HIGH (ref 5–15)
BUN: 32 mg/dL — ABNORMAL HIGH (ref 6–20)
CO2: 22 mmol/L (ref 22–32)
Calcium: 8.7 mg/dL — ABNORMAL LOW (ref 8.9–10.3)
Chloride: 102 mmol/L (ref 101–111)
Creatinine, Ser: 2.42 mg/dL — ABNORMAL HIGH (ref 0.61–1.24)
GFR calc Af Amer: 27 mL/min — ABNORMAL LOW (ref >60)
GFR calc non Af Amer: 23 mL/min — ABNORMAL LOW (ref >60)
Glucose, Bld: 138 mg/dL — ABNORMAL HIGH (ref 65–99)
Potassium: 3.1 mmol/L — ABNORMAL LOW (ref 3.5–5.1)
Sodium: 142 mmol/L (ref 135–145)
Total Bilirubin: 5.9 mg/dL — ABNORMAL HIGH (ref 0.3–1.2)
Total Protein: 7.3 g/dL (ref 6.5–8.1)

## 2017-04-09 LAB — URINALYSIS, ROUTINE W REFLEX MICROSCOPIC
Glucose, UA: NEGATIVE mg/dL
Ketones, ur: NEGATIVE mg/dL
Leukocytes, UA: NEGATIVE
Nitrite: NEGATIVE
Protein, ur: 30 mg/dL — AB
Specific Gravity, Urine: 1.019 (ref 1.005–1.030)
pH: 5 (ref 5.0–8.0)

## 2017-04-09 LAB — BLOOD GAS, ARTERIAL
Acid-base deficit: 7 mmol/L — ABNORMAL HIGH (ref 0.0–2.0)
Bicarbonate: 19.2 mmol/L — ABNORMAL LOW (ref 20.0–28.0)
Drawn by: 23534
FIO2: 100
MECHVT: 510 mL
O2 Saturation: 99.1 %
PEEP: 5 cmH2O
Patient temperature: 37
RATE: 14 resp/min
pCO2 arterial: 30.2 mmHg — ABNORMAL LOW (ref 32.0–48.0)
pH, Arterial: 7.375 (ref 7.350–7.450)
pO2, Arterial: 280 mmHg — ABNORMAL HIGH (ref 83.0–108.0)

## 2017-04-09 LAB — APTT: aPTT: 36 seconds (ref 24–36)

## 2017-04-09 LAB — LACTIC ACID, PLASMA: Lactic Acid, Venous: 5.3 mmol/L (ref 0.5–1.9)

## 2017-04-09 LAB — I-STAT TROPONIN, ED: Troponin i, poc: 0.01 ng/mL (ref 0.00–0.08)

## 2017-04-09 LAB — I-STAT CHEM 8, ED
BUN: 44 mg/dL — ABNORMAL HIGH (ref 6–20)
Calcium, Ion: 1 mmol/L — ABNORMAL LOW (ref 1.15–1.40)
Chloride: 106 mmol/L (ref 101–111)
Creatinine, Ser: 2.7 mg/dL — ABNORMAL HIGH (ref 0.61–1.24)
Glucose, Bld: 140 mg/dL — ABNORMAL HIGH (ref 65–99)
HCT: 37 % — ABNORMAL LOW (ref 39.0–52.0)
Hemoglobin: 12.6 g/dL — ABNORMAL LOW (ref 13.0–17.0)
Potassium: 4.6 mmol/L (ref 3.5–5.1)
Sodium: 143 mmol/L (ref 135–145)
TCO2: 22 mmol/L (ref 22–32)

## 2017-04-09 LAB — INFLUENZA PANEL BY PCR (TYPE A & B)
Influenza A By PCR: NEGATIVE
Influenza B By PCR: NEGATIVE

## 2017-04-09 LAB — PROTIME-INR
INR: 2.35
Prothrombin Time: 25.6 seconds — ABNORMAL HIGH (ref 11.4–15.2)

## 2017-04-09 LAB — LIPASE, BLOOD: Lipase: 742 U/L — ABNORMAL HIGH (ref 11–51)

## 2017-04-09 LAB — BRAIN NATRIURETIC PEPTIDE: B Natriuretic Peptide: 109 pg/mL — ABNORMAL HIGH (ref 0.0–100.0)

## 2017-04-09 LAB — I-STAT CG4 LACTIC ACID, ED
Lactic Acid, Venous: 9.53 mmol/L (ref 0.5–1.9)
Lactic Acid, Venous: 5.54 mmol/L (ref 0.5–1.9)

## 2017-04-09 LAB — GLUCOSE, CAPILLARY: Glucose-Capillary: 124 mg/dL — ABNORMAL HIGH (ref 65–99)

## 2017-04-09 MED ORDER — ACETAMINOPHEN 500 MG PO TABS: 1000.0000 mg | ORAL_TABLET | Freq: Once | ORAL | Status: DC

## 2017-04-09 MED ORDER — ACETAMINOPHEN 650 MG RE SUPP
650.0000 mg | Freq: Once | RECTAL | Status: AC
  Administered 2017-04-09: 650 mg via RECTAL
  Filled 2017-04-09: qty 1

## 2017-04-09 MED ORDER — NOREPINEPHRINE BITARTRATE 1 MG/ML IV SOLN
0.0000 ug/min | INTRAVENOUS | Status: DC
  Administered 2017-04-09: 2 ug/min via INTRAVENOUS
  Administered 2017-04-10 (×2): 40 ug/min via INTRAVENOUS
  Filled 2017-04-09: qty 4

## 2017-04-09 MED ORDER — PIPERACILLIN-TAZOBACTAM 3.375 G IVPB 30 MIN
3.3750 g | Freq: Once | INTRAVENOUS | Status: AC
  Administered 2017-04-09: 3.375 g via INTRAVENOUS
  Filled 2017-04-09: qty 50

## 2017-04-09 MED ORDER — ETOMIDATE 2 MG/ML IV SOLN
20.0000 mg | Freq: Once | INTRAVENOUS | Status: AC
  Administered 2017-04-09: 20 mg via INTRAVENOUS

## 2017-04-09 MED ORDER — SUCCINYLCHOLINE CHLORIDE 20 MG/ML IJ SOLN
100.0000 mg | Freq: Once | INTRAMUSCULAR | Status: AC
  Administered 2017-04-09: 100 mg via INTRAVENOUS

## 2017-04-09 MED ORDER — FENTANYL CITRATE (PF) 100 MCG/2ML IJ SOLN
INTRAMUSCULAR | Status: AC
  Filled 2017-04-09: qty 2

## 2017-04-09 MED ORDER — VANCOMYCIN HCL IN DEXTROSE 1-5 GM/200ML-% IV SOLN
1000.0000 mg | Freq: Once | INTRAVENOUS | Status: AC
  Administered 2017-04-09: 1000 mg via INTRAVENOUS
  Filled 2017-04-09: qty 200

## 2017-04-09 MED ORDER — PHENYLEPHRINE 40 MCG/ML (10ML) SYRINGE FOR IV PUSH (FOR BLOOD PRESSURE SUPPORT)
PREFILLED_SYRINGE | INTRAVENOUS | Status: AC
  Administered 2017-04-09: 120 ug via INTRAVENOUS
  Filled 2017-04-09: qty 10

## 2017-04-09 MED ORDER — SODIUM CHLORIDE 0.9 % IV BOLUS (SEPSIS)
1000.0000 mL | Freq: Once | INTRAVENOUS | Status: AC
  Administered 2017-04-09: 1000 mL via INTRAVENOUS

## 2017-04-09 MED ORDER — NOREPINEPHRINE BITARTRATE 1 MG/ML IV SOLN
INTRAVENOUS | Status: AC
  Filled 2017-04-09: qty 4

## 2017-04-09 MED ORDER — FENTANYL CITRATE (PF) 100 MCG/2ML IJ SOLN
50.0000 ug | Freq: Once | INTRAMUSCULAR | Status: AC
  Administered 2017-04-09: 50 ug via INTRAVENOUS

## 2017-04-09 MED ORDER — PHENYLEPHRINE 40 MCG/ML (10ML) SYRINGE FOR IV PUSH (FOR BLOOD PRESSURE SUPPORT)
120.0000 ug | PREFILLED_SYRINGE | Freq: Once | INTRAVENOUS | Status: AC
  Administered 2017-04-09: 120 ug via INTRAVENOUS

## 2017-04-09 MED ORDER — PROPOFOL 1000 MG/100ML IV EMUL
5.0000 ug/kg/min | Freq: Once | INTRAVENOUS | Status: DC
  Filled 2017-04-09: qty 100

## 2017-04-09 MED ORDER — FENTANYL CITRATE (PF) 100 MCG/2ML IJ SOLN
100.0000 ug | Freq: Once | INTRAMUSCULAR | Status: AC
  Administered 2017-04-09: 100 ug via INTRAVENOUS

## 2017-04-09 MED ORDER — SODIUM CHLORIDE 0.9 % IV SOLN
20.0000 ug/h | INTRAVENOUS | Status: DC
  Administered 2017-04-09: 25 ug/h via INTRAVENOUS
  Administered 2017-04-09: 20 ug/h via INTRAVENOUS
  Filled 2017-04-09: qty 50

## 2017-04-09 NOTE — ED Notes (Signed)
Patient opens eyes and gags. Shakes head to questions. Verbal order for fentanyl 100 mcg IV to be given.

## 2017-04-09 NOTE — ED Notes (Signed)
CRITICAL VALUE ALERT  Critical Value:  Lactic 5.3   Date & Time Notied: 04/09/17 at 2121  Provider Notified: EDP Travas Schexnayder   Orders Received/Actions taken: No orders at this time

## 2017-04-09 NOTE — ED Notes (Signed)
Am speaking with E Link on the phone. Dr Laverta [facility] currently putting in antibiotics

## 2017-04-09 NOTE — ED Notes (Signed)
Date and time results received: 04/09/17  2123 (use smartphrase ".now" to insert current time)  Test: Lactic Acid Critical Value: 5.3  Name of Provider Notified: Long  Orders Received? Or Actions Taken?:

## 2017-04-09 NOTE — Progress Notes (Signed)
Pharmacy Note:  Initial antibiotic(s) regimen of Vancomycin and Zosyn ordered by EDP to treat Sepsis.  Estimated Creatinine Clearance: 21.3 mL/min (A) (by C-G formula based on SCr of 2.7 mg/dL (H)).   No Known Allergies  Vitals:   04/09/17 2015 04/09/17 2020  BP: (!) 60/34 (!) 63/55  Pulse:    Resp: 17 (!) 22  Temp: (!) 100.8 F (38.2 C) (!) 100.8 F (38.2 C)  SpO2: 100%     Anti-infectives (From admission, onward)   Start     Dose/Rate Route Frequency Ordered Stop   04/09/17 2015  piperacillin-tazobactam (ZOSYN) IVPB 3.375 g     3.375 g 100 mL/hr over 30 Minutes Intravenous  Once 04/09/17 2008     04/09/17 2015  vancomycin (VANCOCIN) IVPB 1000 mg/200 mL premix     1,000 mg 200 mL/hr over 60 Minutes Intravenous  Once 04/09/17 2008        Plan: Initial dose(s) of Vancomycin and Zosyn  X 1 ordered. F/U admission orders for further dosing if therapy continued.  Pricilla Larsson, [facility] 04/09/2017 8:24 PM
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
## 2017-04-09 NOTE — ED Notes (Signed)
Patient arrived to ER via EMS, patient is alert and oriented. Speech is clear. Left sided facial droop noted at triage. States this happened last night at approximately 2000.

## 2017-04-09 NOTE — Telephone Encounter (Signed)
Noted. Will forward to PCP.

## 2017-04-09 NOTE — ED Notes (Signed)
Stopped fentanyl due to hypotension. B/P 62/52.

## 2017-04-09 NOTE — Telephone Encounter (Signed)
  Pt's wife called with her husband having symptoms of nausea and vomiting last night and this morning having a b/p of 154/88 and HR of 114, his normal b/p is 120/60 and HR 60-70s. He does have some left side weakness already but today he can not walk, were as before he was using a hemi walker to get around. He has slurred speech and mouth seems to droop more on the left.  Per protocol pt needs to go to the emergency department to r/o stroke. [facility] is the closest hospital.  Wife will call [phone]. Reason for Disposition . [1] SEVERE weakness (i.e., unable to walk or barely able to walk, requires support) AND [2] new onset or worsening  Answer Assessment - Initial Assessment Questions 1. SYMPTOM: "What is the main symptom you are concerned about?" (e.g., weakness, numbness)     weakness 2. ONSET: "When did this start?" (minutes, hours, days; while sleeping)     Last night before vomiting 3. LAST NORMAL: "When was the last time you were normal (no symptoms)?"     On Saturday 4. PATTERN "Does this come and go, or has it been constant since it started?"  "Is it present now?"     Constant, and present now 5. CARDIAC SYMPTOMS: "Have you had any of the following symptoms: chest pain, difficulty breathing, palpitations?"     Breathing hard, sluggish 6. NEUROLOGIC SYMPTOMS: "Have you had any of the following symptoms: headache, dizziness, vision loss, double vision, changes in speech, unsteady on your feet?"     Slurred speech, left lip drooped 7. OTHER SYMPTOMS: "Do you have any other symptoms?"     No other 8. PREGNANCY: "Is there any chance you are pregnant?" "When was your last menstrual period?"     no  Protocols used: NEUROLOGIC DEFICIT-A-AH

## 2017-04-09 NOTE — ED Notes (Signed)
Fentanyl 2,500 mcg/219ml wasted a total of 2452mcg in sink and witnessed by Brigid Re

## 2017-04-09 NOTE — ED Notes (Signed)
RSI underway, RT at bedside assisting EDP.

## 2017-04-09 NOTE — ED Triage Notes (Signed)
Patient from home complaining of vomiting starting last night. Also complaining of left sided facial droop starting at 2000 last night. Denies pain. Patient is paralyzed on left side from traumatic brain injury.

## 2017-04-09 NOTE — ED Notes (Signed)
CRITICAL VALUE ALERT  Critical Value:  Lactic 5.54  Date & Time Notied:  04/09/17 2022  Provider Notified: EDP Elizabelle Fite  Orders Received/Actions taken: No orders at this time

## 2017-04-09 NOTE — ED Notes (Signed)
While at the bedside, patient started moaning. Speech inaudible when asked questions. Able to tell me his name but speech very slurred and garbled at this time. Dr Laverta [facility] summoned to bedside. Patient's eyes deviated to left. Respirations 50 bpm at this time.

## 2017-04-09 NOTE — ED Notes (Signed)
Dr Laverta [facility] at bedside attempting central line.
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
## 2017-04-10 ENCOUNTER — Inpatient Hospital Stay (HOSPITAL_COMMUNITY): Payer: Medicare Other

## 2017-04-10 DIAGNOSIS — N183 Chronic kidney disease, stage 3 (moderate): Secondary | ICD-10-CM

## 2017-04-10 DIAGNOSIS — R579 Shock, unspecified: Secondary | ICD-10-CM | POA: Diagnosis present

## 2017-04-10 DIAGNOSIS — N179 Acute kidney failure, unspecified: Secondary | ICD-10-CM

## 2017-04-10 DIAGNOSIS — A419 Sepsis, unspecified organism: Secondary | ICD-10-CM

## 2017-04-10 DIAGNOSIS — R29810 Facial weakness: Secondary | ICD-10-CM

## 2017-04-10 DIAGNOSIS — I361 Nonrheumatic tricuspid (valve) insufficiency: Secondary | ICD-10-CM

## 2017-04-10 DIAGNOSIS — K859 Acute pancreatitis without necrosis or infection, unspecified: Secondary | ICD-10-CM

## 2017-04-10 DIAGNOSIS — R740 Nonspecific elevation of levels of transaminase and lactic acid dehydrogenase [LDH]: Secondary | ICD-10-CM

## 2017-04-10 DIAGNOSIS — J9601 Acute respiratory failure with hypoxia: Secondary | ICD-10-CM

## 2017-04-10 DIAGNOSIS — R6521 Severe sepsis with septic shock: Secondary | ICD-10-CM

## 2017-04-10 HISTORY — PX: IR PERC CHOLECYSTOSTOMY: IMG2326

## 2017-04-10 LAB — CBC WITH DIFFERENTIAL/PLATELET
Basophils Absolute: 0 10*3/uL (ref 0.0–0.1)
Basophils Relative: 0 %
Eosinophils Absolute: 0 10*3/uL (ref 0.0–0.7)
Eosinophils Relative: 0 %
HCT: 32.7 % — ABNORMAL LOW (ref 39.0–52.0)
Hemoglobin: 10.7 g/dL — ABNORMAL LOW (ref 13.0–17.0)
Lymphocytes Relative: 2 %
Lymphs Abs: 0.6 10*3/uL — ABNORMAL LOW (ref 0.7–4.0)
MCH: 30.7 pg (ref 26.0–34.0)
MCHC: 32.7 g/dL (ref 30.0–36.0)
MCV: 94 fL (ref 78.0–100.0)
Monocytes Absolute: 0.3 10*3/uL (ref 0.1–1.0)
Monocytes Relative: 1 %
Neutro Abs: 27.8 10*3/uL — ABNORMAL HIGH (ref 1.7–7.7)
Neutrophils Relative %: 97 %
Platelets: 73 10*3/uL — ABNORMAL LOW (ref 150–400)
RBC: 3.48 MIL/uL — ABNORMAL LOW (ref 4.22–5.81)
RDW: 15.9 % — ABNORMAL HIGH (ref 11.5–15.5)
WBC: 28.7 10*3/uL — ABNORMAL HIGH (ref 4.0–10.5)

## 2017-04-10 LAB — RESPIRATORY PANEL BY PCR

## 2017-04-10 LAB — COMPREHENSIVE METABOLIC PANEL
ALT: 289 U/L — ABNORMAL HIGH (ref 17–63)
AST: 305 U/L — ABNORMAL HIGH (ref 15–41)
Albumin: 2.7 g/dL — ABNORMAL LOW (ref 3.5–5.0)
Alkaline Phosphatase: 152 U/L — ABNORMAL HIGH (ref 38–126)
Anion gap: 14 (ref 5–15)
BUN: 41 mg/dL — ABNORMAL HIGH (ref 6–20)
CO2: 19 mmol/L — ABNORMAL LOW (ref 22–32)
Calcium: 7.2 mg/dL — ABNORMAL LOW (ref 8.9–10.3)
Chloride: 104 mmol/L (ref 101–111)
Creatinine, Ser: 5.01 mg/dL — ABNORMAL HIGH (ref 0.61–1.24)
GFR calc Af Amer: 11 mL/min — ABNORMAL LOW (ref >60)
GFR calc non Af Amer: 10 mL/min — ABNORMAL LOW (ref >60)
Glucose, Bld: 171 mg/dL — ABNORMAL HIGH (ref 65–99)
Potassium: 4.2 mmol/L (ref 3.5–5.1)
Sodium: 137 mmol/L (ref 135–145)
Total Bilirubin: 5.1 mg/dL — ABNORMAL HIGH (ref 0.3–1.2)
Total Protein: 5.9 g/dL — ABNORMAL LOW (ref 6.5–8.1)

## 2017-04-10 LAB — BLOOD GAS, ARTERIAL
Acid-base deficit: 8.4 mmol/L — ABNORMAL HIGH (ref 0.0–2.0)
Acid-base deficit: 8.2 mmol/L — ABNORMAL HIGH (ref 0.0–2.0)
Acid-base deficit: 7.2 mmol/L — ABNORMAL HIGH (ref 0.0–2.0)
Bicarbonate: 16 mmol/L — ABNORMAL LOW (ref 20.0–28.0)
Bicarbonate: 17.1 mmol/L — ABNORMAL LOW (ref 20.0–28.0)
Bicarbonate: 18.8 mmol/L — ABNORMAL LOW (ref 20.0–28.0)
Drawn by: 41977
Drawn by: 249101
FIO2: 40
FIO2: 30
FIO2: 40
MECHVT: 510 mL
MECHVT: 510 mL
MECHVT: 510 mL
O2 Saturation: 94.3 %
O2 Saturation: 93.6 %
O2 Saturation: 91.7 %
PEEP: 5 cmH2O
PEEP: 5 cmH2O
PEEP: 5 cmH2O
Patient temperature: 98.6
Patient temperature: 98.6
Patient temperature: 98.6
RATE: 16 resp/min
RATE: 16 resp/min
RATE: 16 resp/min
pCO2 arterial: 29.2 mmHg — ABNORMAL LOW (ref 32.0–48.0)
pCO2 arterial: 36.3 mmHg (ref 32.0–48.0)
pCO2 arterial: 44.1 mmHg (ref 32.0–48.0)
pH, Arterial: 7.358 (ref 7.350–7.450)
pH, Arterial: 7.295 — ABNORMAL LOW (ref 7.350–7.450)
pH, Arterial: 7.252 — ABNORMAL LOW (ref 7.350–7.450)
pO2, Arterial: 76.4 mmHg — ABNORMAL LOW (ref 83.0–108.0)
pO2, Arterial: 74.2 mmHg — ABNORMAL LOW (ref 83.0–108.0)
pO2, Arterial: 71.4 mmHg — ABNORMAL LOW (ref 83.0–108.0)

## 2017-04-10 LAB — BASIC METABOLIC PANEL
Anion gap: 17 — ABNORMAL HIGH (ref 5–15)
Anion gap: 16 — ABNORMAL HIGH (ref 5–15)
BUN: 37 mg/dL — ABNORMAL HIGH (ref 6–20)
BUN: 45 mg/dL — ABNORMAL HIGH (ref 6–20)
CO2: 15 mmol/L — ABNORMAL LOW (ref 22–32)
CO2: 18 mmol/L — ABNORMAL LOW (ref 22–32)
Calcium: 7.5 mg/dL — ABNORMAL LOW (ref 8.9–10.3)
Calcium: 6.5 mg/dL — ABNORMAL LOW (ref 8.9–10.3)
Chloride: 105 mmol/L (ref 101–111)
Chloride: 104 mmol/L (ref 101–111)
Creatinine, Ser: 4.31 mg/dL — ABNORMAL HIGH (ref 0.61–1.24)
Creatinine, Ser: 5.14 mg/dL — ABNORMAL HIGH (ref 0.61–1.24)
GFR calc Af Amer: 13 mL/min — ABNORMAL LOW (ref >60)
GFR calc Af Amer: 11 mL/min — ABNORMAL LOW (ref >60)
GFR calc non Af Amer: 12 mL/min — ABNORMAL LOW (ref >60)
GFR calc non Af Amer: 9 mL/min — ABNORMAL LOW (ref >60)
Glucose, Bld: 193 mg/dL — ABNORMAL HIGH (ref 65–99)
Glucose, Bld: 179 mg/dL — ABNORMAL HIGH (ref 65–99)
Potassium: 3.6 mmol/L (ref 3.5–5.1)
Potassium: 3.8 mmol/L (ref 3.5–5.1)
Sodium: 137 mmol/L (ref 135–145)
Sodium: 138 mmol/L (ref 135–145)

## 2017-04-10 LAB — CBC
HCT: 36.3 % — ABNORMAL LOW (ref 39.0–52.0)
HCT: 36.2 % — ABNORMAL LOW (ref 39.0–52.0)
Hemoglobin: 11.6 g/dL — ABNORMAL LOW (ref 13.0–17.0)
Hemoglobin: 11.5 g/dL — ABNORMAL LOW (ref 13.0–17.0)
MCH: 30.4 pg (ref 26.0–34.0)
MCH: 30.3 pg (ref 26.0–34.0)
MCHC: 32 g/dL (ref 30.0–36.0)
MCHC: 31.8 g/dL (ref 30.0–36.0)
MCV: 95.3 fL (ref 78.0–100.0)
MCV: 95.3 fL (ref 78.0–100.0)
Platelets: 108 10*3/uL — ABNORMAL LOW (ref 150–400)
Platelets: 81 10*3/uL — ABNORMAL LOW (ref 150–400)
RBC: 3.81 MIL/uL — ABNORMAL LOW (ref 4.22–5.81)
RBC: 3.8 MIL/uL — ABNORMAL LOW (ref 4.22–5.81)
RDW: 15.5 % (ref 11.5–15.5)
RDW: 15.8 % — ABNORMAL HIGH (ref 11.5–15.5)
WBC: 26 10*3/uL — ABNORMAL HIGH (ref 4.0–10.5)
WBC: 25.8 10*3/uL — ABNORMAL HIGH (ref 4.0–10.5)

## 2017-04-10 LAB — ETHANOL: Alcohol, Ethyl (B): <10 mg/dL (ref <10)

## 2017-04-10 LAB — COOXEMETRY PANEL
Carboxyhemoglobin: 0.4 % — ABNORMAL LOW (ref 0.5–1.5)
Carboxyhemoglobin: 0.8 % (ref 0.5–1.5)
Methemoglobin: 1.6 % — ABNORMAL HIGH (ref 0.0–1.5)
Methemoglobin: 1.2 % (ref 0.0–1.5)
O2 Saturation: 52.8 %
O2 Saturation: 45.5 %
Total hemoglobin: 11.9 g/dL — ABNORMAL LOW (ref 12.0–16.0)
Total hemoglobin: 12 g/dL (ref 12.0–16.0)

## 2017-04-10 LAB — RAPID URINE DRUG SCREEN, HOSP PERFORMED
Amphetamines: NOT DETECTED
Barbiturates: NOT DETECTED
Benzodiazepines: NOT DETECTED
Cocaine: NOT DETECTED
Opiates: NOT DETECTED
Tetrahydrocannabinol: NOT DETECTED

## 2017-04-10 LAB — PROTIME-INR
INR: 2.79
INR: 2.19
Prothrombin Time: 29.2 seconds — ABNORMAL HIGH (ref 11.4–15.2)
Prothrombin Time: 24.2 seconds — ABNORMAL HIGH (ref 11.4–15.2)

## 2017-04-10 LAB — BLOOD CULTURE ID PANEL (REFLEXED)

## 2017-04-10 LAB — ACETAMINOPHEN LEVEL: Acetaminophen (Tylenol), Serum: <10 ug/mL — ABNORMAL LOW (ref 10–30)

## 2017-04-10 LAB — LACTIC ACID, PLASMA
Lactic Acid, Venous: 5.5 mmol/L (ref 0.5–1.9)
Lactic Acid, Venous: 6 mmol/L (ref 0.5–1.9)
Lactic Acid, Venous: 4.7 mmol/L (ref 0.5–1.9)
Lactic Acid, Venous: 4.2 mmol/L (ref 0.5–1.9)
Lactic Acid, Venous: 4 mmol/L (ref 0.5–1.9)
Lactic Acid, Venous: 3.1 mmol/L (ref 0.5–1.9)

## 2017-04-10 LAB — MRSA PCR SCREENING: MRSA by PCR: NEGATIVE

## 2017-04-10 LAB — HEPATIC FUNCTION PANEL
ALT: 247 U/L — ABNORMAL HIGH (ref 17–63)
AST: 263 U/L — ABNORMAL HIGH (ref 15–41)
Albumin: 2.7 g/dL — ABNORMAL LOW (ref 3.5–5.0)
Alkaline Phosphatase: 168 U/L — ABNORMAL HIGH (ref 38–126)
Bilirubin, Direct: 3.8 mg/dL — ABNORMAL HIGH (ref 0.1–0.5)
Indirect Bilirubin: 2.3 mg/dL — ABNORMAL HIGH (ref 0.3–0.9)
Total Bilirubin: 6.1 mg/dL — ABNORMAL HIGH (ref 0.3–1.2)
Total Protein: 5.5 g/dL — ABNORMAL LOW (ref 6.5–8.1)

## 2017-04-10 LAB — GLUCOSE, CAPILLARY
Glucose-Capillary: 93 mg/dL (ref 65–99)
Glucose-Capillary: 117 mg/dL — ABNORMAL HIGH (ref 65–99)
Glucose-Capillary: 80 mg/dL (ref 65–99)

## 2017-04-10 LAB — MAGNESIUM
Magnesium: 1.2 mg/dL — ABNORMAL LOW (ref 1.7–2.4)
Magnesium: 1.5 mg/dL — ABNORMAL LOW (ref 1.7–2.4)

## 2017-04-10 LAB — ECHOCARDIOGRAM COMPLETE
Height: 66 in
Weight: 3231.06 oz

## 2017-04-10 LAB — LIPASE, BLOOD: Lipase: 329 U/L — ABNORMAL HIGH (ref 11–51)

## 2017-04-10 LAB — PHOSPHORUS
Phosphorus: 5.1 mg/dL — ABNORMAL HIGH (ref 2.5–4.6)
Phosphorus: 5.6 mg/dL — ABNORMAL HIGH (ref 2.5–4.6)

## 2017-04-10 LAB — CORTISOL: Cortisol, Plasma: 33.2 ug/dL

## 2017-04-10 LAB — SALICYLATE LEVEL: Salicylate Lvl: <7 mg/dL (ref 2.8–30.0)

## 2017-04-10 LAB — PROCALCITONIN: Procalcitonin: >150 ng/mL

## 2017-04-10 MED ORDER — VITAMIN K1 10 MG/ML IJ SOLN
5.0000 mg | Freq: Once | INTRAVENOUS | Status: DC
  Filled 2017-04-10: qty 0.5

## 2017-04-10 MED ORDER — SODIUM BICARBONATE 8.4 % IV SOLN
INTRAVENOUS | Status: DC
  Administered 2017-04-10 – 2017-04-11 (×2): via INTRAVENOUS
  Filled 2017-04-10 (×3): qty 150

## 2017-04-10 MED ORDER — FENTANYL CITRATE (PF) 100 MCG/2ML IJ SOLN
INTRAMUSCULAR | Status: AC
  Filled 2017-04-10: qty 2

## 2017-04-10 MED ORDER — IOPAMIDOL (ISOVUE-300) INJECTION 61%
INTRAVENOUS | Status: AC
  Administered 2017-04-10: 15 mL
  Filled 2017-04-10: qty 50

## 2017-04-10 MED ORDER — LIDOCAINE HCL 1 % IJ SOLN
INTRAMUSCULAR | Status: AC
  Filled 2017-04-10: qty 20

## 2017-04-10 MED ORDER — DOBUTAMINE IN D5W 4-5 MG/ML-% IV SOLN
2.5000 ug/kg/min | INTRAVENOUS | Status: DC
  Administered 2017-04-10: 5 ug/kg/min via INTRAVENOUS
  Filled 2017-04-10: qty 250

## 2017-04-10 MED ORDER — PANTOPRAZOLE SODIUM 40 MG IV SOLR
40.0000 mg | Freq: Every day | INTRAVENOUS | Status: DC
  Administered 2017-04-10 – 2017-04-12 (×3): 40 mg via INTRAVENOUS
  Filled 2017-04-10 (×3): qty 40

## 2017-04-10 MED ORDER — SODIUM BICARBONATE 8.4 % IV SOLN
INTRAVENOUS | Status: AC
  Filled 2017-04-10: qty 100

## 2017-04-10 MED ORDER — FENTANYL 2500MCG IN NS 250ML (10MCG/ML) PREMIX INFUSION
0.0000 ug/h | INTRAVENOUS | Status: DC
  Administered 2017-04-10: 150 ug/h via INTRAVENOUS
  Administered 2017-04-10: 25 ug/h via INTRAVENOUS
  Administered 2017-04-11: 300 ug/h via INTRAVENOUS
  Administered 2017-04-11: 225 ug/h via INTRAVENOUS
  Filled 2017-04-10 (×4): qty 250

## 2017-04-10 MED ORDER — SODIUM CHLORIDE 0.9 % IV BOLUS (SEPSIS)
500.0000 mL | Freq: Once | INTRAVENOUS | Status: AC
  Administered 2017-04-10: 500 mL via INTRAVENOUS

## 2017-04-10 MED ORDER — VANCOMYCIN HCL IN DEXTROSE 750-5 MG/150ML-% IV SOLN
750.0000 mg | INTRAVENOUS | Status: DC
  Administered 2017-04-10: 750 mg via INTRAVENOUS
  Filled 2017-04-10: qty 150

## 2017-04-10 MED ORDER — PIPERACILLIN-TAZOBACTAM IN DEX 2-0.25 GM/50ML IV SOLN
2.2500 g | Freq: Four times a day (QID) | INTRAVENOUS | Status: DC
  Administered 2017-04-10: 2.25 g via INTRAVENOUS
  Filled 2017-04-10 (×2): qty 50

## 2017-04-10 MED ORDER — IOPAMIDOL (ISOVUE-300) INJECTION 61%
INTRAVENOUS | Status: AC
  Administered 2017-04-10 (×2)
  Filled 2017-04-10: qty 30

## 2017-04-10 MED ORDER — SODIUM CHLORIDE 0.9 % IV SOLN
2.0000 g | INTRAVENOUS | Status: AC
  Administered 2017-04-10 – 2017-04-23 (×14): 2 g via INTRAVENOUS
  Filled 2017-04-10 (×15): qty 20

## 2017-04-10 MED ORDER — PIPERACILLIN-TAZOBACTAM 3.375 G IVPB
3.3750 g | Freq: Three times a day (TID) | INTRAVENOUS | Status: DC
Start: 2017-04-10 — End: 2017-04-10
  Administered 2017-04-10: 3.375 g via INTRAVENOUS
  Filled 2017-04-10 (×2): qty 50

## 2017-04-10 MED ORDER — SODIUM BICARBONATE 8.4 % IV SOLN
100.0000 meq | Freq: Once | INTRAVENOUS | Status: AC
  Administered 2017-04-10: 100 meq via INTRAVENOUS

## 2017-04-10 MED ORDER — MIDAZOLAM HCL 2 MG/2ML IJ SOLN: 1.0000 mg | INTRAMUSCULAR | Status: DC | PRN

## 2017-04-10 MED ORDER — LIDOCAINE HCL (PF) 1 % IJ SOLN
INTRAMUSCULAR | Status: AC | PRN
  Administered 2017-04-10: 5 mL

## 2017-04-10 MED ORDER — SODIUM CHLORIDE 0.9 % IV BOLUS (SEPSIS)
1000.0000 mL | Freq: Once | INTRAVENOUS | Status: AC
  Administered 2017-04-10: 1000 mL via INTRAVENOUS

## 2017-04-10 MED ORDER — PERFLUTREN LIPID MICROSPHERE
1.0000 mL | INTRAVENOUS | Status: AC | PRN
  Administered 2017-04-10: 3.5 mL via INTRAVENOUS
  Filled 2017-04-10: qty 10

## 2017-04-10 MED ORDER — MIDAZOLAM HCL 2 MG/2ML IJ SOLN
INTRAMUSCULAR | Status: AC
  Filled 2017-04-10: qty 2

## 2017-04-10 MED ORDER — VASOPRESSIN 20 UNIT/ML IV SOLN
0.0300 [IU]/min | INTRAVENOUS | Status: DC
  Administered 2017-04-10 – 2017-04-11 (×2): 0.03 [IU]/min via INTRAVENOUS
  Filled 2017-04-10 (×2): qty 2

## 2017-04-10 MED ORDER — VITAMIN K1 10 MG/ML IJ SOLN
10.0000 mg | Freq: Once | INTRAMUSCULAR | Status: AC
  Administered 2017-04-10: 10 mg via INTRAVENOUS
  Filled 2017-04-10: qty 1

## 2017-04-10 MED ORDER — NOREPINEPHRINE BITARTRATE 1 MG/ML IV SOLN
0.0000 ug/min | INTRAVENOUS | Status: DC
  Administered 2017-04-10: 60 ug/min via INTRAVENOUS
  Administered 2017-04-10: 55 ug/min via INTRAVENOUS
  Administered 2017-04-10 (×2): 65 ug/min via INTRAVENOUS
  Administered 2017-04-11: 35 ug/min via INTRAVENOUS
  Administered 2017-04-11: 43 ug/min via INTRAVENOUS
  Administered 2017-04-12: 15 ug/min via INTRAVENOUS
  Filled 2017-04-10 (×7): qty 16

## 2017-04-10 MED ORDER — SODIUM CHLORIDE 0.9 % IV SOLN
Freq: Once | INTRAVENOUS | Status: AC
  Administered 2017-04-10: 21:00:00 via INTRAVENOUS

## 2017-04-10 MED ORDER — ORAL CARE MOUTH RINSE
15.0000 mL | Freq: Four times a day (QID) | OROMUCOSAL | Status: DC
  Administered 2017-04-10 – 2017-04-13 (×13): 15 mL via OROMUCOSAL

## 2017-04-10 MED ORDER — SODIUM CHLORIDE 0.9 % IV SOLN
250.0000 mL | INTRAVENOUS | Status: DC | PRN
  Administered 2017-04-12 – 2017-04-15 (×3): 250 mL via INTRAVENOUS

## 2017-04-10 MED ORDER — LACTATED RINGERS IV BOLUS (SEPSIS)
1000.0000 mL | Freq: Once | INTRAVENOUS | Status: AC
  Administered 2017-04-10: 1000 mL via INTRAVENOUS

## 2017-04-10 MED ORDER — MIDAZOLAM HCL 2 MG/2ML IJ SOLN
1.0000 mg | INTRAMUSCULAR | Status: DC | PRN
  Administered 2017-04-10: 1 mg via INTRAVENOUS
  Filled 2017-04-10 (×4): qty 2

## 2017-04-10 MED ORDER — CHLORHEXIDINE GLUCONATE 0.12% ORAL RINSE (MEDLINE KIT)
15.0000 mL | Freq: Two times a day (BID) | OROMUCOSAL | Status: DC
  Administered 2017-04-10 – 2017-04-12 (×6): 15 mL via OROMUCOSAL

## 2017-04-10 MED ORDER — MIDAZOLAM HCL 2 MG/2ML IJ SOLN
INTRAMUSCULAR | Status: AC | PRN
  Administered 2017-04-10: 0.5 mg via INTRAVENOUS
  Administered 2017-04-10: 1 mg via INTRAVENOUS
  Administered 2017-04-10: 0.5 mg via INTRAVENOUS

## 2017-04-10 MED ORDER — HYDROCORTISONE NA SUCCINATE PF 100 MG IJ SOLR
50.0000 mg | Freq: Four times a day (QID) | INTRAMUSCULAR | Status: DC
  Administered 2017-04-10 – 2017-04-12 (×11): 50 mg via INTRAVENOUS
  Filled 2017-04-10 (×4): qty 2
  Filled 2017-04-10 (×2): qty 1
  Filled 2017-04-10 (×3): qty 2

## 2017-04-10 MED ORDER — MAGNESIUM SULFATE 2 GM/50ML IV SOLN
2.0000 g | Freq: Once | INTRAVENOUS | Status: AC
  Administered 2017-04-10: 2 g via INTRAVENOUS
  Filled 2017-04-10: qty 50

## 2017-04-10 MED ORDER — METRONIDAZOLE IN NACL 5-0.79 MG/ML-% IV SOLN
500.0000 mg | Freq: Three times a day (TID) | INTRAVENOUS | Status: DC
  Administered 2017-04-10 – 2017-04-12 (×7): 500 mg via INTRAVENOUS
  Filled 2017-04-10 (×8): qty 100

## 2017-04-10 MED ORDER — MIDAZOLAM HCL 2 MG/2ML IJ SOLN: 4.0000 mg | Freq: Once | INTRAMUSCULAR | Status: DC

## 2017-04-10 MED ORDER — HEPARIN SODIUM (PORCINE) 5000 UNIT/ML IJ SOLN: 5000.0000 [IU] | Freq: Three times a day (TID) | INTRAMUSCULAR | Status: DC

## 2017-04-10 MED ORDER — FENTANYL CITRATE (PF) 100 MCG/2ML IJ SOLN: 50.0000 ug | INTRAMUSCULAR | Status: DC | PRN

## 2017-04-10 MED ORDER — FENTANYL CITRATE (PF) 100 MCG/2ML IJ SOLN
50.0000 ug | INTRAMUSCULAR | Status: DC | PRN
  Administered 2017-04-10 – 2017-04-12 (×2): 50 ug via INTRAVENOUS
  Filled 2017-04-10 (×2): qty 2

## 2017-04-10 NOTE — Sedation Documentation (Signed)
[name] on a fentanyl gtt
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
## 2017-04-10 NOTE — Progress Notes (Signed)
CRITICAL VALUE ALERT  Critical Value:  Lactic Acid 4.7  Date & Time Notied:  04/10/17 @ 0943  Provider Notified: Dr. Einar Grad  Orders Received/Actions taken: no new orders at this time

## 2017-04-10 NOTE — Procedures (Signed)
Pre procedural Dx: Acute cholecystitis Post procedural Dx: Same  Technically successful [place] and Fluoro guided placed of a 10 Fr drainage catheter placement into the gallbladder for acute cholecystitis. Sample of aspirated bile sent to lab for analysis. Chole tube connected to gravity bag.  EBL: None No immediate post procedural complications.   Ronny Bacon, MD Pager #: [phone]

## 2017-04-10 NOTE — Progress Notes (Signed)
Patient returned to 3M04 from [place].

## 2017-04-10 NOTE — Progress Notes (Signed)
eLink Physician-Brief Progress Note Patient Name: ANMOL FLECK DOB: 16-Sep-1932 MRN: [id number]   Date of Service  04/10/2017  HPI/Events of Note  84 year old male with past medical history which is significant for CKD stage III, hypertension, atrial fibrillation on AC, prostate cancer, and chronic left-sided weakness.  He presented to any pain emergency department 3/18 with complaints of new onset left-sided facial droop and approximately 24 hours of malaise with vomiting and subjective fevers.  There is no evidence of stroke or hemorrhage on CT of the head, and he was deemed to be outside the window for TPA.  As a neurologic workup was reaching his conclusion the patient acutely decompensated.  His mental status declined and he developed respiratory distress along with hypotension.  He was initially DNR, however, his wife reversed his CODE STATUS with the thoughts that this could potentially be a temporary issue.  He was intubated for airway protection and started on vasopressors for hypotension.  He was started on broad-spectrum antibiotics for presumed sepsis, however, source remains unclear.  He was transferred to [facility] for ICU admission. Admitted to PCCM. VSS.   eICU Interventions  No new orders.      Intervention Category Evaluation Type: New Patient Evaluation  Lysle Dingwall 04/10/2017, 12:02 AM

## 2017-04-10 NOTE — Progress Notes (Signed)
  Echocardiogram 2D Echocardiogram with definity has been performed.  Darlina Sicilian M 04/10/2017, 2:06 PM

## 2017-04-10 NOTE — Sedation Documentation (Signed)
Patient denies pain and is resting comfortably.  
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
## 2017-04-10 NOTE — Procedures (Signed)
Arterial Catheter Insertion Procedure Note Austin [name] [id number] 1932-08-30  Procedure: Insertion of Arterial Catheter  Indications: Blood pressure monitoring  Procedure Details Consent: Risks of procedure as well as the alternatives and risks of each were explained to the (patient/caregiver).  Consent for procedure obtained. Time Out: Verified patient identification, verified procedure, site/side was marked, verified correct patient position, special equipment/implants available, medications/allergies/relevent history reviewed, required imaging and test results available.  Performed  Maximum sterile technique was used including antiseptics, cap, gloves, gown, hand hygiene, mask and sheet. Skin prep: Chlorhexidine; local anesthetic administered 20 gauge catheter was inserted into right radial artery using the Seldinger technique.  Evaluation Blood flow good; BP tracing good. Complications: No apparent complications.   Blanchie Serve 04/10/2017

## 2017-04-10 NOTE — Progress Notes (Signed)
Patient transported from 3M04 to CT and back without any complications.

## 2017-04-10 NOTE — Progress Notes (Signed)
[facility] Progress Note Patient Name: Austin [name] DOB: [birth year] MRN: [id number]   Date of Service  04/10/2017  HPI/Events of Note  Lactic Acid = 5.5 --> 6.0. BP improved at 97/76, However, HR = 121.   eICU Interventions  Will order:  1. Decrease Dobutamine IV infusion to 2.5 mcg/kg/min.  2. Bolus with 0.9 NaCl 500 mL IV over 30 minutes now.      Intervention Category Major Interventions: Hypertension - evaluation and management;Shock - evaluation and management  Sommer,Steven Cornelia Copa 04/10/2017, 6:52 AM

## 2017-04-10 NOTE — Progress Notes (Signed)
[facility] Progress Note Patient Name: Austin [name] DOB: 19-Mar-1932 MRN: [id number]   Date of Service  04/10/2017  HPI/Events of Note  Lactic Acid level = 5.4 --> 5.3 --> 5.5. BP = 78/60 with MAP = 64. CVP = 18 (questionable as it is from a femoral CVL) and Hgb = 12.6.   eICU Interventions  Will order: 1. COOX now.  2. Increase ceiling on Norepinephrine IV infusion to 70 mcg/min.      Intervention Category Major Interventions: Acid-Base disturbance - evaluation and management  [name] 04/10/2017, 3:08 AM

## 2017-04-10 NOTE — Progress Notes (Signed)
Pharmacy Antibiotic Note  Austin [name] is a 84 y.o. male admitted on 04/09/2017 with PNA/sepsis.  Pharmacy has been consulted for Vancomycin and Zosyn dosing.  Vancomycin 1 g IV given in ED at  2146  Plan: Vancomycin 750 mg IV q48h Zosyn 3.375 g IV q8h   Height: 5\' 6"  (167.6 cm) Weight: 183 lb (83 kg) IBW/kg (Calculated) : 63.8  Temp (24hrs), Avg:101.3 F (38.5 C), Min:98.4 F (36.9 C), Max:104 F (40 C)  Recent Labs  Lab 04/09/17 1509 04/09/17 1611 04/09/17 1655 04/09/17 2006 04/09/17 2042  WBC 6.3  --   --   --   --   CREATININE 2.42* 2.70*  --   --   --   LATICACIDVEN  --   --  9.53* 5.54* 5.3*    Estimated Creatinine Clearance: 21.3 mL/min (A) (by C-G formula based on SCr of 2.7 mg/dL (H)).    No Known Allergies   Caryl Pina 04/10/2017 1:06 AM

## 2017-04-10 NOTE — Progress Notes (Signed)
[facility] Progress Note Patient Name: RONON FERGER DOB: 07-16-1932 MRN: [id number]   Date of Service  04/10/2017  HPI/Events of Note  Hypotension and Lactic Acidosis - Last Hgb = 12.6. COOX = 52.8%. Currently on Vasopressin and Norepinephrine IV infusion.   eICU Interventions  Will order: 1. Dobutamine IV infusion at 5 mcg/kg/min.  2. Repeat COOX at 7 AM.     Intervention Category Major Interventions: Acid-Base disturbance - evaluation and management;Hypotension - evaluation and management  Sommer,Steven Cornelia Copa 04/10/2017, 5:06 AM

## 2017-04-10 NOTE — Progress Notes (Signed)
Patient with acute cholecystitis with septic shock and bacteremia, VDRF, AKI-on-CKD. Patient's vasopressor requirement has not improved throughout the day despite broad spectrum antibiotics. Remains on 2-3 vasopressors. RUQ US performed 3/19 @ 2am. CT Abdomen performed at 7am. Patient is too unstable for operative cholecystectomy, therefore have not consulted Surgery to evaluate the patient.   Have called IR (Dr Pascal Lux) for evaluation for percutaneous cholecystostomy tube placement. Patient's INR is therapeutic (on coumadin as an outpatient). Will give Vit K 74m IV and 2u FFP STAT. Patient's family was present at bedside prior to my initiating consult to IR, but they then left. Called family and they explained they have been at the hospital all day and wanted to go home to rest. I spoke with his wife BPamala Hurryon the phone and obtained consent for transfusion as well as the drain placement. She is agreeable and says she will remain by the phone in case IR wants to call her back. Of note, although intubated, patient is wide awake and oriented and is capable in my opinion of making his own decisions at this time.   Patient's wife's contact info is as follows: [facility] [phone]: [phone]

## 2017-04-10 NOTE — Progress Notes (Signed)
CRITICAL VALUE ALERT  Critical Value:  Lactic acid 5.5  Date & Time Notied:  04/10/17 0300  Provider Notified: Dr. Oletta Darter  Orders Received/Actions taken: MD to place orders

## 2017-04-10 NOTE — Progress Notes (Signed)
CRITICAL VALUE ALERT  Critical Value:  Lactic acid 6.0  Date & Time Notied:  04/10/17 6:44 AM   Provider Notified: e link  Orders Received/Actions taken: MD to place orders if necessary
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
## 2017-04-10 NOTE — Progress Notes (Signed)
CRITICAL VALUE ALERT  Critical Value:  3 positive Blood Culture Bottles, 2 anaerobic, 1 aerobic bottle: Gram negative Rods  Date & Time Notied:  04/10/17 0451 (notified by [facility])  Provider Notified: E-Link RN  Orders Received/Actions taken: No new orders at this time, will notify [facility]. Pt currently ordered for vanco and zosyn.

## 2017-04-10 NOTE — Progress Notes (Addendum)
PHARMACY - PHYSICIAN COMMUNICATION CRITICAL VALUE ALERT - BLOOD CULTURE IDENTIFICATION (BCID)  Austin [name] is an 84 y.o. male who presented to [facility] on 04/09/2017 with a chief complaint of sepsis  Assessment:  Admitted with sepsis due to cholecystitis. He now has gram negative bacteremia with E coli detected on BCID.  Name of physician (or Provider) Contacted: Dr. Lynetta Mare  Current antibiotics: Vancomycin and Zosyn  Changes to prescribed antibiotics recommended:  Recommendations accepted by provider  Change antibiotics to Ceftriaxone 2g IV q12h and Metronidazole 500mg  IV q8h  Results for orders placed or performed during the hospital encounter of 04/09/17  Blood Culture ID Panel (Reflexed) (Collected: 04/09/2017  3:31 PM)  Result Value Ref Range   Enterococcus species NOT DETECTED NOT DETECTED   Listeria monocytogenes NOT DETECTED NOT DETECTED   Staphylococcus species NOT DETECTED NOT DETECTED   Staphylococcus aureus NOT DETECTED NOT DETECTED   Streptococcus species NOT DETECTED NOT DETECTED   Streptococcus agalactiae NOT DETECTED NOT DETECTED   Streptococcus pneumoniae NOT DETECTED NOT DETECTED   Streptococcus pyogenes NOT DETECTED NOT DETECTED   Acinetobacter baumannii NOT DETECTED NOT DETECTED   Enterobacteriaceae species DETECTED (A) NOT DETECTED   Enterobacter cloacae complex NOT DETECTED NOT DETECTED   Escherichia coli DETECTED (A) NOT DETECTED   Klebsiella oxytoca NOT DETECTED NOT DETECTED   Klebsiella pneumoniae NOT DETECTED NOT DETECTED   Proteus species NOT DETECTED NOT DETECTED   Serratia marcescens NOT DETECTED NOT DETECTED   Carbapenem resistance NOT DETECTED NOT DETECTED   Haemophilus influenzae NOT DETECTED NOT DETECTED   Neisseria meningitidis NOT DETECTED NOT DETECTED   Pseudomonas aeruginosa NOT DETECTED NOT DETECTED   Candida albicans NOT DETECTED NOT DETECTED   Candida glabrata NOT DETECTED NOT DETECTED   Candida krusei NOT DETECTED NOT DETECTED   Candida parapsilosis NOT DETECTED NOT DETECTED   Candida tropicalis NOT DETECTED NOT DETECTED    Legrand Como, Pharm.D., BCPS, BCIDP Clinical Pharmacist Phone: [phone] or [phone] 04/10/2017, 9:37 AM

## 2017-04-11 ENCOUNTER — Encounter (HOSPITAL_COMMUNITY): Payer: Self-pay | Admitting: Interventional Radiology

## 2017-04-11 ENCOUNTER — Inpatient Hospital Stay (HOSPITAL_COMMUNITY): Payer: Medicare Other

## 2017-04-11 DIAGNOSIS — K810 Acute cholecystitis: Secondary | ICD-10-CM

## 2017-04-11 LAB — PROTIME-INR
INR: 1.82
Prothrombin Time: 20.9 seconds — ABNORMAL HIGH (ref 11.4–15.2)

## 2017-04-11 LAB — HEPATITIS PANEL, ACUTE
HCV Ab: <0.1 s/co ratio (ref 0.0–0.9)
Hep A IgM: NEGATIVE
Hep B C IgM: NEGATIVE
Hepatitis B Surface Ag: NEGATIVE

## 2017-04-11 LAB — PREPARE FRESH FROZEN PLASMA
Unit division: 0
Unit division: 0

## 2017-04-11 LAB — BPAM FFP
Blood Product Expiration Date: 201903212359
Blood Product Expiration Date: 201903212359
ISSUE DATE / TIME: 201903192058
ISSUE DATE / TIME: 201903192119
Unit Type and Rh: 6200
Unit Type and Rh: 6200

## 2017-04-11 LAB — GLUCOSE, CAPILLARY
Glucose-Capillary: 105 mg/dL — ABNORMAL HIGH (ref 65–99)
Glucose-Capillary: 107 mg/dL — ABNORMAL HIGH (ref 65–99)
Glucose-Capillary: 118 mg/dL — ABNORMAL HIGH (ref 65–99)
Glucose-Capillary: 122 mg/dL — ABNORMAL HIGH (ref 65–99)
Glucose-Capillary: 86 mg/dL (ref 65–99)

## 2017-04-11 LAB — URINE CULTURE
Culture: NO GROWTH
Special Requests: NORMAL

## 2017-04-11 LAB — LACTIC ACID, PLASMA: Lactic Acid, Venous: 3.4 mmol/L (ref 0.5–1.9)

## 2017-04-11 MED ORDER — MAGNESIUM SULFATE 2 GM/50ML IV SOLN
2.0000 g | Freq: Once | INTRAVENOUS | Status: AC
  Administered 2017-04-11: 2 g via INTRAVENOUS

## 2017-04-11 MED ORDER — STERILE WATER FOR INJECTION IV SOLN
INTRAVENOUS | Status: DC
  Administered 2017-04-11 – 2017-04-12 (×2): via INTRAVENOUS
  Filled 2017-04-11 (×4): qty 850

## 2017-04-11 MED ORDER — CHLORHEXIDINE GLUCONATE CLOTH 2 % EX PADS: 6.0000 | MEDICATED_PAD | Freq: Every day | CUTANEOUS | Status: DC

## 2017-04-11 MED ORDER — SODIUM CHLORIDE 0.9% FLUSH
10.0000 mL | Freq: Two times a day (BID) | INTRAVENOUS | Status: DC
  Administered 2017-04-11 – 2017-04-13 (×4): 10 mL
  Administered 2017-04-14: 20 mL
  Administered 2017-04-14 – 2017-04-25 (×16): 10 mL

## 2017-04-11 MED ORDER — DEXMEDETOMIDINE HCL IN NACL 200 MCG/50ML IV SOLN
0.4000 ug/kg/h | INTRAVENOUS | Status: DC
  Administered 2017-04-11: 1.2 ug/kg/h via INTRAVENOUS
  Filled 2017-04-11: qty 50

## 2017-04-11 MED ORDER — HEPARIN SODIUM (PORCINE) 5000 UNIT/ML IJ SOLN
5000.0000 [IU] | Freq: Three times a day (TID) | INTRAMUSCULAR | Status: DC
  Administered 2017-04-11 – 2017-04-20 (×28): 5000 [IU] via SUBCUTANEOUS
  Filled 2017-04-11 (×29): qty 1

## 2017-04-11 MED ORDER — HALOPERIDOL LACTATE 5 MG/ML IJ SOLN
2.5000 mg | Freq: Four times a day (QID) | INTRAMUSCULAR | Status: DC | PRN
  Administered 2017-04-11: 2.5 mg via INTRAVENOUS
  Filled 2017-04-11: qty 1

## 2017-04-11 MED ORDER — SODIUM CHLORIDE 0.9% FLUSH
10.0000 mL | INTRAVENOUS | Status: DC | PRN
  Administered 2017-04-19 – 2017-04-21 (×3): 10 mL
  Filled 2017-04-11 (×3): qty 40

## 2017-04-11 MED ORDER — VITAL HIGH PROTEIN PO LIQD
1000.0000 mL | ORAL | Status: DC
  Administered 2017-04-11: 1000 mL
  Filled 2017-04-11 (×3): qty 1000

## 2017-04-11 MED ORDER — DEXMEDETOMIDINE HCL IN NACL 400 MCG/100ML IV SOLN
0.4000 ug/kg/h | INTRAVENOUS | Status: DC
  Administered 2017-04-11: 1.2 ug/kg/h via INTRAVENOUS
  Administered 2017-04-12: 1 ug/kg/h via INTRAVENOUS
  Administered 2017-04-12: 1.1 ug/kg/h via INTRAVENOUS
  Administered 2017-04-12: 1 ug/kg/h via INTRAVENOUS
  Administered 2017-04-12: 0.8 ug/kg/h via INTRAVENOUS
  Administered 2017-04-12: 1 ug/kg/h via INTRAVENOUS
  Administered 2017-04-13: 0.8 ug/kg/h via INTRAVENOUS
  Administered 2017-04-13: 0.9 ug/kg/h via INTRAVENOUS
  Filled 2017-04-11 (×8): qty 100

## 2017-04-11 MED FILL — Norepinephrine Bitartrate IV Soln 1 MG/ML (Base Equivalent): INTRAVENOUS | Qty: 8 | Status: AC

## 2017-04-11 NOTE — ED Notes (Signed)
CRITICAL VALUE ALERT  Critical Value:  Lactic Acid - 9.53  Date & Time Notied:  04/09/17  1715  Provider Notified: Dr Laverta Baltimore  Orders Received/Actions taken: None at this time.
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
## 2017-04-11 NOTE — ED Notes (Signed)
Received call from [place] concerning code sepsis. Advised Dr Laverta Baltimore, per Dr Laverta Baltimore continue with code sepsis. No further orders at this time.
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
## 2017-04-12 LAB — GLUCOSE, CAPILLARY
Glucose-Capillary: 125 mg/dL — ABNORMAL HIGH (ref 65–99)
Glucose-Capillary: 144 mg/dL — ABNORMAL HIGH (ref 65–99)
Glucose-Capillary: 148 mg/dL — ABNORMAL HIGH (ref 65–99)
Glucose-Capillary: 156 mg/dL — ABNORMAL HIGH (ref 65–99)
Glucose-Capillary: 165 mg/dL — ABNORMAL HIGH (ref 65–99)
Glucose-Capillary: 169 mg/dL — ABNORMAL HIGH (ref 65–99)

## 2017-04-12 LAB — CULTURE, BLOOD (ROUTINE X 2): Special Requests: ADEQUATE

## 2017-04-12 LAB — RENAL FUNCTION PANEL
Albumin: 2.3 g/dL — ABNORMAL LOW (ref 3.5–5.0)
Anion gap: 16 — ABNORMAL HIGH (ref 5–15)
BUN: 63 mg/dL — ABNORMAL HIGH (ref 6–20)
CO2: 23 mmol/L (ref 22–32)
Calcium: 6 mg/dL — CL (ref 8.9–10.3)
Chloride: 96 mmol/L — ABNORMAL LOW (ref 101–111)
Creatinine, Ser: 6.95 mg/dL — ABNORMAL HIGH (ref 0.61–1.24)
GFR calc Af Amer: 8 mL/min — ABNORMAL LOW (ref >60)
GFR calc non Af Amer: 7 mL/min — ABNORMAL LOW (ref >60)
Glucose, Bld: 163 mg/dL — ABNORMAL HIGH (ref 65–99)
Phosphorus: 4.3 mg/dL (ref 2.5–4.6)
Potassium: 3.9 mmol/L (ref 3.5–5.1)
Sodium: 135 mmol/L (ref 135–145)

## 2017-04-12 LAB — PROTIME-INR
INR: 1.58
Prothrombin Time: 18.7 seconds — ABNORMAL HIGH (ref 11.4–15.2)

## 2017-04-12 MED ORDER — QUETIAPINE FUMARATE 50 MG PO TABS
50.0000 mg | ORAL_TABLET | Freq: Two times a day (BID) | ORAL | Status: DC
  Administered 2017-04-12 – 2017-04-13 (×2): 50 mg via ORAL
  Filled 2017-04-12 (×4): qty 1

## 2017-04-12 MED ORDER — HYDROCORTISONE NA SUCCINATE PF 100 MG IJ SOLR
25.0000 mg | Freq: Four times a day (QID) | INTRAMUSCULAR | Status: AC
  Administered 2017-04-12 – 2017-04-13 (×3): 25 mg via INTRAVENOUS
  Filled 2017-04-12 (×2): qty 0.5

## 2017-04-12 MED ORDER — NEPRO/CARBSTEADY PO LIQD
1000.0000 mL | ORAL | Status: DC
  Administered 2017-04-12: 1000 mL
  Filled 2017-04-12 (×3): qty 1000

## 2017-04-12 MED ORDER — CHLORHEXIDINE GLUCONATE CLOTH 2 % EX PADS
6.0000 | MEDICATED_PAD | Freq: Every day | CUTANEOUS | Status: DC
  Administered 2017-04-12 – 2017-04-24 (×13): 6 via TOPICAL
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
## 2017-04-12 NOTE — Progress Notes (Signed)
CRITICAL VALUE ALERT  Critical Value:  Calcium 6.0  Date & Time Notied:  04/12/2017 04:25  Provider Notified: Mortimer Fries  Orders Received/Actions taken: TBA
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
## 2017-04-12 NOTE — Progress Notes (Signed)
Fentanyl drip 160cc wasted in sink with Donald Siva Rn
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
## 2017-04-13 ENCOUNTER — Other Ambulatory Visit: Payer: Self-pay

## 2017-04-13 ENCOUNTER — Encounter (HOSPITAL_COMMUNITY): Payer: Self-pay

## 2017-04-13 DIAGNOSIS — A4151 Sepsis due to Escherichia coli [E. coli]: Principal | ICD-10-CM

## 2017-04-13 LAB — GLUCOSE, CAPILLARY
Glucose-Capillary: 210 mg/dL — ABNORMAL HIGH (ref 65–99)
Glucose-Capillary: 222 mg/dL — ABNORMAL HIGH (ref 65–99)
Glucose-Capillary: 136 mg/dL — ABNORMAL HIGH (ref 65–99)
Glucose-Capillary: 110 mg/dL — ABNORMAL HIGH (ref 65–99)
Glucose-Capillary: 79 mg/dL (ref 65–99)

## 2017-04-13 LAB — BASIC METABOLIC PANEL
Anion gap: 17 — ABNORMAL HIGH (ref 5–15)
BUN: 110 mg/dL — ABNORMAL HIGH (ref 6–20)
CO2: 23 mmol/L (ref 22–32)
Calcium: 6.5 mg/dL — ABNORMAL LOW (ref 8.9–10.3)
Chloride: 98 mmol/L — ABNORMAL LOW (ref 101–111)
Creatinine, Ser: 9.01 mg/dL — ABNORMAL HIGH (ref 0.61–1.24)
GFR calc Af Amer: 6 mL/min — ABNORMAL LOW (ref >60)
GFR calc non Af Amer: 5 mL/min — ABNORMAL LOW (ref >60)
Glucose, Bld: 106 mg/dL — ABNORMAL HIGH (ref 65–99)
Potassium: 3.2 mmol/L — ABNORMAL LOW (ref 3.5–5.1)
Sodium: 138 mmol/L (ref 135–145)

## 2017-04-13 LAB — PROTIME-INR
INR: 1.39
Prothrombin Time: 17 seconds — ABNORMAL HIGH (ref 11.4–15.2)

## 2017-04-13 LAB — COMPREHENSIVE METABOLIC PANEL
ALT: 99 U/L — ABNORMAL HIGH (ref 17–63)
AST: 41 U/L (ref 15–41)
Albumin: 1.9 g/dL — ABNORMAL LOW (ref 3.5–5.0)
Alkaline Phosphatase: 107 U/L (ref 38–126)
Anion gap: 19 — ABNORMAL HIGH (ref 5–15)
BUN: 97 mg/dL — ABNORMAL HIGH (ref 6–20)
CO2: 22 mmol/L (ref 22–32)
Calcium: 6.3 mg/dL — CL (ref 8.9–10.3)
Chloride: 96 mmol/L — ABNORMAL LOW (ref 101–111)
Creatinine, Ser: 8.22 mg/dL — ABNORMAL HIGH (ref 0.61–1.24)
GFR calc Af Amer: 6 mL/min — ABNORMAL LOW (ref >60)
GFR calc non Af Amer: 5 mL/min — ABNORMAL LOW (ref >60)
Glucose, Bld: 208 mg/dL — ABNORMAL HIGH (ref 65–99)
Potassium: 3.5 mmol/L (ref 3.5–5.1)
Sodium: 137 mmol/L (ref 135–145)
Total Bilirubin: 1.5 mg/dL — ABNORMAL HIGH (ref 0.3–1.2)
Total Protein: 5.1 g/dL — ABNORMAL LOW (ref 6.5–8.1)

## 2017-04-13 LAB — CULTURE, BLOOD (ROUTINE X 2): Special Requests: ADEQUATE

## 2017-04-13 LAB — CBC WITH DIFFERENTIAL/PLATELET
Band Neutrophils: 6 %
Basophils Absolute: 0 10*3/uL (ref 0.0–0.1)
Basophils Relative: 0 %
Blasts: 0 %
Eosinophils Absolute: 0 10*3/uL (ref 0.0–0.7)
Eosinophils Relative: 0 %
HCT: 30.7 % — ABNORMAL LOW (ref 39.0–52.0)
Hemoglobin: 10.7 g/dL — ABNORMAL LOW (ref 13.0–17.0)
Lymphocytes Relative: 6 %
Lymphs Abs: 1 10*3/uL (ref 0.7–4.0)
MCH: 31.6 pg (ref 26.0–34.0)
MCHC: 34.9 g/dL (ref 30.0–36.0)
MCV: 90.6 fL (ref 78.0–100.0)
Metamyelocytes Relative: 0 %
Monocytes Absolute: 0.7 10*3/uL (ref 0.1–1.0)
Monocytes Relative: 4 %
Myelocytes: 0 %
Neutro Abs: 15.7 10*3/uL — ABNORMAL HIGH (ref 1.7–7.7)
Neutrophils Relative %: 84 %
Other: 0 %
Platelets: 62 10*3/uL — ABNORMAL LOW (ref 150–400)
Promyelocytes Absolute: 0 %
RBC: 3.39 MIL/uL — ABNORMAL LOW (ref 4.22–5.81)
RDW: 14.7 % (ref 11.5–15.5)
WBC: 17.4 10*3/uL — ABNORMAL HIGH (ref 4.0–10.5)
nRBC: 0 /100 WBC

## 2017-04-13 MED ORDER — INSULIN ASPART 100 UNIT/ML ~~LOC~~ SOLN
0.0000 [IU] | Freq: Three times a day (TID) | SUBCUTANEOUS | Status: DC
  Administered 2017-04-13: 2 [IU] via SUBCUTANEOUS

## 2017-04-13 MED ORDER — POTASSIUM CHLORIDE 10 MEQ/50ML IV SOLN
10.0000 meq | INTRAVENOUS | Status: AC
  Administered 2017-04-13 (×2): 10 meq via INTRAVENOUS
  Filled 2017-04-13 (×2): qty 50

## 2017-04-13 MED ORDER — LACTULOSE 10 GM/15ML PO SOLN
30.0000 g | Freq: Once | ORAL | Status: AC
  Administered 2017-04-13: 30 g via ORAL
  Filled 2017-04-13: qty 45

## 2017-04-13 MED ORDER — POTASSIUM CHLORIDE 10 MEQ/100ML IV SOLN
10.0000 meq | INTRAVENOUS | Status: DC
  Filled 2017-04-13 (×2): qty 100

## 2017-04-13 MED ORDER — ORAL CARE MOUTH RINSE
15.0000 mL | Freq: Two times a day (BID) | OROMUCOSAL | Status: DC
  Administered 2017-04-13 – 2017-04-27 (×28): 15 mL via OROMUCOSAL

## 2017-04-13 MED ORDER — INSULIN ASPART 100 UNIT/ML ~~LOC~~ SOLN: 0.0000 [IU] | Freq: Every day | SUBCUTANEOUS | Status: DC

## 2017-04-13 MED ORDER — FUROSEMIDE 10 MG/ML IJ SOLN
120.0000 mg | Freq: Once | INTRAMUSCULAR | Status: AC
  Administered 2017-04-13: 120 mg via INTRAVENOUS
  Filled 2017-04-13: qty 12

## 2017-04-13 MED ORDER — CHLORHEXIDINE GLUCONATE 0.12% ORAL RINSE (MEDLINE KIT)
15.0000 mL | Freq: Two times a day (BID) | OROMUCOSAL | Status: DC
  Administered 2017-04-13 (×2): 15 mL via OROMUCOSAL

## 2017-04-13 MED ORDER — ORAL CARE MOUTH RINSE
15.0000 mL | OROMUCOSAL | Status: DC
  Administered 2017-04-13 (×6): 15 mL via OROMUCOSAL
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
## 2017-04-13 NOTE — Progress Notes (Signed)
[name]  DOCUMENTATION CODES:   Obesity unspecified  INTERVENTION:   If pt remains on ventilator support today, recommend decreasing rate of TF to better meet pt's needs.  Recommend Nepro at 20mL/hr x24 hours with 72mL prostat QID. This provides total volume of 465mL, 1232kcal, 139g protein, and 175mL free water. Meets 100% of pt's needs.   NUTRITION DIAGNOSIS:   Inadequate oral intake related to acute illness as evidenced by NPO status. Ongoing.  GOAL:   Provide needs based on ASPEN/SCCM guidelines Progressing.  MONITOR:   TF tolerance, Vent status, Labs, Weight trends  REASON FOR ASSESSMENT:   Consult, Ventilator Enteral/tube feeding initiation and management  ASSESSMENT:     84 yo male admitted with N/V x 2 days, fever x 1 day with septic shock and bacteremia in setting of acute cholecystitis, oligruic AKI on CKD, VDRF. Pt with hx of HTN, HLD, CKD, adenocarcinoma of prostate (2020), paralysis from [place] (2015)   3/20  Percutaneous cholecystostomy tube placed. Trickle TF initiated; Vital HP @ 80ml/hr advanced 55mL every 4 hours to goal of 34ml/hr with 11mL prostat TID. 3/21 TF switched to Nepro at 2115. Pt off levophed and sodium bicarbonate  Pt now with anuric AKI. Potential dialytic intervention in the next 24-48hrs.  At time of visit, pt remains on ventilator support. Per RN, SBT planned today, hopefull pt will pass and subsequently soon pass swallow evaluation.  At time of visit, pt receiving Nepro at 28mL/hr. This exceeds pt's estimated calorie needs.   Net positive 13L since admit 63mL urine output x24 hours 78mL drain output Wt up 29lb since 3/19 likely due to fluid.  Medications; Rocephin d/c today.  Labs: BUN (H;97), Cr (H;8.22), Corrected Ca (L;7.98), Albumin (L;1.9), K WNL Phos WNL on 3/21  CBG (last 3)  Recent Labs    04/12/17 2328 04/13/17 0342 04/13/17 0726  GLUCAP 156* 210* 222*    Diet Order:  Diet NPO time  specified  EDUCATION NEEDS:   Not appropriate for education at this time  Skin:  Skin Assessment: Reviewed RN Assessment  Last BM:  3/19  Height:   Ht Readings from Last 1 Encounters:  04/09/17 5\' 6"  (1.676 m)    Weight:   Wt Readings from Last 1 Encounters:  04/13/17 230 lb 9.6 oz (104.6 kg)  3/19  201lb 15.1oz  Ideal Body Weight:  64.5 kg  BMI:  Body mass index is 37.22 kg/m.  Estimated Nutritional Needs:   Kcal:  2774-1287 kcals  Protein:  129-162 g  Fluid:  1.3-1.5 L   Carlen Fils, MS, Dietetic Intern Pager # [phone]

## 2017-04-13 NOTE — Care Management Note (Signed)
Case Management Note  Patient Details  Name: Austin [name] MRN: [id number] Date of Birth: Jan 28, 1932  Subjective/Objective:       Pt admitted with septic shcok             Action/Plan:  PTA from home with wife  - pt remains ventilated with family at bedside.  AKI - Pt will likely need HD initiated this admit.  CM will continue to follow for discharge needs   Expected Discharge Date:                  Expected Discharge Plan:     In-House Referral:  Clinical Social Work  Discharge planning Services  CM Consult  Post Acute Care Choice:    Choice offered to:     DME Arranged:    DME Agency:     HH Arranged:    [place] Agency:     Status of Service:     If discussed at [employer], dates discussed:    Additional Comments:  Maryclare Labrador, RN 04/13/2017, 3:20 PM
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
## 2017-04-13 NOTE — Procedures (Signed)
Extubation Procedure Note  Patient Details:   Name: Austin [name] DOB: February 17, 1932 MRN: [id number]   Airway Documentation:     Evaluation  O2 sats: stable throughout Complications: No apparent complications Patient did tolerate procedure well. Bilateral Breath Sounds: Clear, Diminished   Yes   Pt extubated to 3L [place] per MD order. Pt stable throughout with no complications. VS within normal limits. Clear/Diminished BS throughout. Positive cuff leak noted prior to extubation. Pt able to speak and has a strong non-productive cough post extubation and was encouraged to use [place] to clear secretions.   Jesse Sans 04/13/2017, 4:29 PM
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
## 2017-04-14 DIAGNOSIS — J9600 Acute respiratory failure, unspecified whether with hypoxia or hypercapnia: Secondary | ICD-10-CM

## 2017-04-14 LAB — CBC WITH DIFFERENTIAL/PLATELET
Basophils Absolute: 0 10*3/uL (ref 0.0–0.1)
Basophils Relative: 0 %
Eosinophils Absolute: 0.2 10*3/uL (ref 0.0–0.7)
Eosinophils Relative: 1 %
HCT: 33.2 % — ABNORMAL LOW (ref 39.0–52.0)
Hemoglobin: 11.4 g/dL — ABNORMAL LOW (ref 13.0–17.0)
Lymphocytes Relative: 8 %
Lymphs Abs: 1.6 10*3/uL (ref 0.7–4.0)
MCH: 31.3 pg (ref 26.0–34.0)
MCHC: 34.3 g/dL (ref 30.0–36.0)
MCV: 91.2 fL (ref 78.0–100.0)
Monocytes Absolute: 1 10*3/uL (ref 0.1–1.0)
Monocytes Relative: 5 %
Neutro Abs: 16.8 10*3/uL — ABNORMAL HIGH (ref 1.7–7.7)
Neutrophils Relative %: 86 %
Platelets: 77 10*3/uL — ABNORMAL LOW (ref 150–400)
RBC: 3.64 MIL/uL — ABNORMAL LOW (ref 4.22–5.81)
RDW: 14.7 % (ref 11.5–15.5)
WBC: 19.6 10*3/uL — ABNORMAL HIGH (ref 4.0–10.5)

## 2017-04-14 LAB — GLUCOSE, CAPILLARY
Glucose-Capillary: 117 mg/dL — ABNORMAL HIGH (ref 65–99)
Glucose-Capillary: 70 mg/dL (ref 65–99)
Glucose-Capillary: 85 mg/dL (ref 65–99)
Glucose-Capillary: 72 mg/dL (ref 65–99)
Glucose-Capillary: 87 mg/dL (ref 65–99)
Glucose-Capillary: 66 mg/dL (ref 65–99)
Glucose-Capillary: 112 mg/dL — ABNORMAL HIGH (ref 65–99)

## 2017-04-14 LAB — RENAL FUNCTION PANEL
Albumin: 2.1 g/dL — ABNORMAL LOW (ref 3.5–5.0)
Anion gap: 19 — ABNORMAL HIGH (ref 5–15)
BUN: 127 mg/dL — ABNORMAL HIGH (ref 6–20)
CO2: 22 mmol/L (ref 22–32)
Calcium: 6.9 mg/dL — ABNORMAL LOW (ref 8.9–10.3)
Chloride: 98 mmol/L — ABNORMAL LOW (ref 101–111)
Creatinine, Ser: 9.9 mg/dL — ABNORMAL HIGH (ref 0.61–1.24)
GFR calc Af Amer: 5 mL/min — ABNORMAL LOW (ref >60)
GFR calc non Af Amer: 4 mL/min — ABNORMAL LOW (ref >60)
Glucose, Bld: 77 mg/dL (ref 65–99)
Phosphorus: 4.9 mg/dL — ABNORMAL HIGH (ref 2.5–4.6)
Potassium: 3 mmol/L — ABNORMAL LOW (ref 3.5–5.1)
Sodium: 139 mmol/L (ref 135–145)

## 2017-04-14 LAB — COMPREHENSIVE METABOLIC PANEL
ALT: 81 U/L — ABNORMAL HIGH (ref 17–63)
AST: 30 U/L (ref 15–41)
Albumin: 2.1 g/dL — ABNORMAL LOW (ref 3.5–5.0)
Alkaline Phosphatase: 108 U/L (ref 38–126)
Anion gap: 18 — ABNORMAL HIGH (ref 5–15)
BUN: 126 mg/dL — ABNORMAL HIGH (ref 6–20)
CO2: 21 mmol/L — ABNORMAL LOW (ref 22–32)
Calcium: 6.8 mg/dL — ABNORMAL LOW (ref 8.9–10.3)
Chloride: 98 mmol/L — ABNORMAL LOW (ref 101–111)
Creatinine, Ser: 9.83 mg/dL — ABNORMAL HIGH (ref 0.61–1.24)
GFR calc Af Amer: 5 mL/min — ABNORMAL LOW (ref >60)
GFR calc non Af Amer: 4 mL/min — ABNORMAL LOW (ref >60)
Glucose, Bld: 119 mg/dL — ABNORMAL HIGH (ref 65–99)
Potassium: 2.9 mmol/L — ABNORMAL LOW (ref 3.5–5.1)
Sodium: 137 mmol/L (ref 135–145)
Total Bilirubin: 1.2 mg/dL (ref 0.3–1.2)
Total Protein: 5.2 g/dL — ABNORMAL LOW (ref 6.5–8.1)

## 2017-04-14 LAB — PROTIME-INR
INR: 1.42
Prothrombin Time: 17.2 seconds — ABNORMAL HIGH (ref 11.4–15.2)

## 2017-04-14 MED ORDER — POTASSIUM CHLORIDE 10 MEQ/50ML IV SOLN
10.0000 meq | INTRAVENOUS | Status: AC
  Administered 2017-04-14 (×3): 10 meq via INTRAVENOUS
  Filled 2017-04-14 (×3): qty 50

## 2017-04-14 MED ORDER — DEXTROSE 50 % IV SOLN
INTRAVENOUS | Status: AC
  Filled 2017-04-14: qty 50

## 2017-04-14 MED ORDER — DEXTROSE 50 % IV SOLN
INTRAVENOUS | Status: AC
  Administered 2017-04-14: 25 mL
  Filled 2017-04-14: qty 50

## 2017-04-14 MED ORDER — ONDANSETRON HCL 4 MG/2ML IJ SOLN
4.0000 mg | Freq: Four times a day (QID) | INTRAMUSCULAR | Status: DC | PRN
  Administered 2017-04-14 – 2017-04-24 (×5): 4 mg via INTRAVENOUS
  Filled 2017-04-14 (×5): qty 2

## 2017-04-14 MED ORDER — DEXTROSE 50 % IV SOLN
25.0000 mL | Freq: Once | INTRAVENOUS | Status: AC
  Administered 2017-04-14: 25 mL via INTRAVENOUS
  Filled 2017-04-14: qty 50
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
## 2017-04-14 NOTE — Progress Notes (Signed)
Hypoglycemic Event  CBG: 66  Treatment: D50 IV 25 mL  Symptoms: None  Follow-up CBG: Time:2233 CBG Result:112  Possible Reasons for Event: Inadequate meal intake     Raenell Mensing Dewain Penning

## 2017-04-15 LAB — CBC WITH DIFFERENTIAL/PLATELET
Band Neutrophils: 0 %
Basophils Absolute: 0 10*3/uL (ref 0.0–0.1)
Basophils Relative: 0 %
Blasts: 0 %
Eosinophils Absolute: 0.2 10*3/uL (ref 0.0–0.7)
Eosinophils Relative: 1 %
HCT: 34.7 % — ABNORMAL LOW (ref 39.0–52.0)
Hemoglobin: 11.5 g/dL — ABNORMAL LOW (ref 13.0–17.0)
Lymphocytes Relative: 9 %
Lymphs Abs: 1.7 10*3/uL (ref 0.7–4.0)
MCH: 30.6 pg (ref 26.0–34.0)
MCHC: 33.1 g/dL (ref 30.0–36.0)
MCV: 92.3 fL (ref 78.0–100.0)
Metamyelocytes Relative: 0 %
Monocytes Absolute: 1.7 10*3/uL — ABNORMAL HIGH (ref 0.1–1.0)
Monocytes Relative: 9 %
Myelocytes: 0 %
Neutro Abs: 15.8 10*3/uL — ABNORMAL HIGH (ref 1.7–7.7)
Neutrophils Relative %: 81 %
Other: 0 %
Platelets: 96 10*3/uL — ABNORMAL LOW (ref 150–400)
Promyelocytes Absolute: 0 %
RBC: 3.76 MIL/uL — ABNORMAL LOW (ref 4.22–5.81)
RDW: 15 % (ref 11.5–15.5)
WBC: 19.4 10*3/uL — ABNORMAL HIGH (ref 4.0–10.5)
nRBC: 0 /100 WBC

## 2017-04-15 LAB — PROTIME-INR
INR: 1.33
Prothrombin Time: 16.4 seconds — ABNORMAL HIGH (ref 11.4–15.2)

## 2017-04-15 LAB — COMPREHENSIVE METABOLIC PANEL
ALT: 66 U/L — ABNORMAL HIGH (ref 17–63)
AST: 29 U/L (ref 15–41)
Albumin: 2.2 g/dL — ABNORMAL LOW (ref 3.5–5.0)
Alkaline Phosphatase: 115 U/L (ref 38–126)
Anion gap: 18 — ABNORMAL HIGH (ref 5–15)
BUN: 139 mg/dL — ABNORMAL HIGH (ref 6–20)
CO2: 23 mmol/L (ref 22–32)
Calcium: 7.5 mg/dL — ABNORMAL LOW (ref 8.9–10.3)
Chloride: 100 mmol/L — ABNORMAL LOW (ref 101–111)
Creatinine, Ser: 11.14 mg/dL — ABNORMAL HIGH (ref 0.61–1.24)
GFR calc Af Amer: 4 mL/min — ABNORMAL LOW (ref >60)
GFR calc non Af Amer: 4 mL/min — ABNORMAL LOW (ref >60)
Glucose, Bld: 79 mg/dL (ref 65–99)
Potassium: 3.3 mmol/L — ABNORMAL LOW (ref 3.5–5.1)
Sodium: 141 mmol/L (ref 135–145)
Total Bilirubin: 1.4 mg/dL — ABNORMAL HIGH (ref 0.3–1.2)
Total Protein: 5.6 g/dL — ABNORMAL LOW (ref 6.5–8.1)

## 2017-04-15 LAB — CULTURE, BLOOD (ROUTINE X 2): Culture: NO GROWTH

## 2017-04-15 LAB — GLUCOSE, CAPILLARY
Glucose-Capillary: 76 mg/dL (ref 65–99)
Glucose-Capillary: 71 mg/dL (ref 65–99)
Glucose-Capillary: 104 mg/dL — ABNORMAL HIGH (ref 65–99)
Glucose-Capillary: 112 mg/dL — ABNORMAL HIGH (ref 65–99)

## 2017-04-15 MED ORDER — DEXTROSE 10 % IV SOLN
INTRAVENOUS | Status: DC
  Administered 2017-04-15: 09:00:00 via INTRAVENOUS

## 2017-04-15 MED ORDER — FUROSEMIDE 10 MG/ML IJ SOLN
120.0000 mg | Freq: Once | INTRAVENOUS | Status: AC
  Administered 2017-04-15: 120 mg via INTRAVENOUS
  Filled 2017-04-15: qty 10

## 2017-04-15 MED ORDER — FUROSEMIDE 10 MG/ML IJ SOLN
160.0000 mg | Freq: Once | INTRAVENOUS | Status: AC
  Administered 2017-04-15: 160 mg via INTRAVENOUS
  Filled 2017-04-15: qty 16

## 2017-04-15 NOTE — Progress Notes (Addendum)
[facility] Progress Note Patient Name: Austin [name] DOB: [birth year] MRN: [id number]   Date of Service  04/15/2017  HPI/Events of Note  Request from Dr. Elsworth Soho, who rounded on patient today, to check urine output and re-dose Lasix. Urine output post AM dose of Lasix 120 mg IV is only 400 mL per bedside nurse.   eICU Interventions  Will order: 1. Lasix 160 mg IV X 1 now.      Intervention Category Major Interventions: Other: Intermediate Interventions: Oliguria - evaluation and management  Sommer,Steven Eugene 04/15/2017, 7:57 PM
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
## 2017-04-16 LAB — CBC WITH DIFFERENTIAL/PLATELET
Band Neutrophils: 0 %
Basophils Absolute: 0 K/uL (ref 0.0–0.1)
Basophils Relative: 0 %
Blasts: 0 %
Eosinophils Absolute: 1.1 K/uL — ABNORMAL HIGH (ref 0.0–0.7)
Eosinophils Relative: 6 %
HCT: 34.1 % — ABNORMAL LOW (ref 39.0–52.0)
Hemoglobin: 11.4 g/dL — ABNORMAL LOW (ref 13.0–17.0)
Lymphocytes Relative: 16 %
Lymphs Abs: 2.9 K/uL (ref 0.7–4.0)
MCH: 30.5 pg (ref 26.0–34.0)
MCHC: 33.4 g/dL (ref 30.0–36.0)
MCV: 91.2 fL (ref 78.0–100.0)
Metamyelocytes Relative: 0 %
Monocytes Absolute: 1.8 K/uL — ABNORMAL HIGH (ref 0.1–1.0)
Monocytes Relative: 10 %
Myelocytes: 0 %
Neutro Abs: 12.2 K/uL — ABNORMAL HIGH (ref 1.7–7.7)
Neutrophils Relative %: 68 %
Platelets: 132 K/uL — ABNORMAL LOW (ref 150–400)
Promyelocytes Absolute: 0 %
RBC: 3.74 MIL/uL — ABNORMAL LOW (ref 4.22–5.81)
RDW: 14.9 % (ref 11.5–15.5)
WBC: 18 K/uL — ABNORMAL HIGH (ref 4.0–10.5)
nRBC: 0 /100{WBCs}

## 2017-04-16 LAB — RENAL FUNCTION PANEL
Albumin: 2.2 g/dL — ABNORMAL LOW (ref 3.5–5.0)
Anion gap: 20 — ABNORMAL HIGH (ref 5–15)
BUN: 146 mg/dL — ABNORMAL HIGH (ref 6–20)
CO2: 23 mmol/L (ref 22–32)
Calcium: 7.5 mg/dL — ABNORMAL LOW (ref 8.9–10.3)
Chloride: 96 mmol/L — ABNORMAL LOW (ref 101–111)
Creatinine, Ser: 12.03 mg/dL — ABNORMAL HIGH (ref 0.61–1.24)
GFR calc Af Amer: 4 mL/min — ABNORMAL LOW (ref >60)
GFR calc non Af Amer: 3 mL/min — ABNORMAL LOW (ref >60)
Glucose, Bld: 116 mg/dL — ABNORMAL HIGH (ref 65–99)
Phosphorus: 6.5 mg/dL — ABNORMAL HIGH (ref 2.5–4.6)
Potassium: 3.5 mmol/L (ref 3.5–5.1)
Sodium: 139 mmol/L (ref 135–145)

## 2017-04-16 LAB — GLUCOSE, CAPILLARY
Glucose-Capillary: 93 mg/dL (ref 65–99)
Glucose-Capillary: 114 mg/dL — ABNORMAL HIGH (ref 65–99)
Glucose-Capillary: 112 mg/dL — ABNORMAL HIGH (ref 65–99)
Glucose-Capillary: 159 mg/dL — ABNORMAL HIGH (ref 65–99)
Glucose-Capillary: 85 mg/dL (ref 65–99)

## 2017-04-16 LAB — AEROBIC/ANAEROBIC CULTURE W GRAM STAIN (SURGICAL/DEEP WOUND)

## 2017-04-16 LAB — CULTURE, BLOOD (ROUTINE X 2): Culture: NO GROWTH

## 2017-04-16 LAB — PROTIME-INR
INR: 1.33
Prothrombin Time: 16.3 seconds — ABNORMAL HIGH (ref 11.4–15.2)

## 2017-04-16 MED ORDER — SODIUM CHLORIDE 0.9% FLUSH
5.0000 mL | Freq: Three times a day (TID) | INTRAVENOUS | Status: DC
  Administered 2017-04-16 – 2017-04-26 (×26): 5 mL

## 2017-04-16 MED ORDER — NEPRO/CARBSTEADY PO LIQD
237.0000 mL | Freq: Two times a day (BID) | ORAL | Status: DC
  Administered 2017-04-16 – 2017-04-27 (×15): 237 mL via ORAL
  Filled 2017-04-16 (×25): qty 237

## 2017-04-16 NOTE — Progress Notes (Signed)
[name]  DOCUMENTATION CODES:   Obesity unspecified  INTERVENTION:    Nepro Shake po BID, each supplement provides 425 kcal and 19 grams protein  NUTRITION DIAGNOSIS:   Inadequate oral intake related to poor appetite, acute illness as evidenced by meal completion < 50%.  Ongoing   GOAL:   Patient will meet greater than or equal to 90% of their needs  Unmet  MONITOR:   PO intake, Supplement acceptance  ASSESSMENT:     84 yo male admitted with N/V x 2 days, fever x 1 day with septic shock and bacteremia in setting of acute cholecystitis, oligruic AKI on CKD, VDRF. Pt with hx of HTN, HLD, CKD, adenocarcinoma of prostate (2020), paralysis from [place] (2015)  Discussed patient in ICU rounds and with RN today.  Extubated 3/22 and started on clear liquids. Diet advanced to regular this morning. Intake of clear liquids has been poor. Patient c/o poor appetite. Family at bedside.  Labs and medications reviewed.  Phosphorus 6.5 (H) Palliative Care team has been consulted for discussion regarding goals of care. Patient is not a long term HD candidate.   Diet Order:  Diet regular Room service appropriate? Yes; Fluid consistency: Thin  EDUCATION NEEDS:   Not appropriate for education at this time  Skin:  Skin Assessment: Reviewed RN Assessment  Last BM:  3/24 (type 7)  Height:   Ht Readings from Last 1 Encounters:  04/09/17 5' 6"  (1.676 m)    Weight:   Wt Readings from Last 1 Encounters:  04/16/17 222 lb 7.1 oz (100.9 kg)    Ideal Body Weight:  64.5 kg  BMI:  Body mass index is 35.9 kg/m.  Estimated Nutritional Needs:   Kcal:  1800-2000  Protein:  100 gm  Fluid:  1 L + UOP    Molli Barrows, RD, LDN, [place] Pager [phone] After Hours Pager [phone]

## 2017-04-16 NOTE — Evaluation (Signed)
Physical Therapy Evaluation Patient Details Name: Austin [name] MRN: [id number] DOB: 27-Nov-1932 Today's Date: 04/16/2017   History of Present Illness   84 yo male admitted with N/V x 2 days, fever x 1 day with septic shock and bacteremia in setting of acute cholecystitis, oligruic AKI on CKD, VDRF. Pt with hx of HTN, HLD, CKD, adenocarcinoma of prostate (2020), paralysis from Chris[place] (2015).  cholecytostomy tube placed 3/19.  Pt needs dialysis currently and is not a candidate for long term dialysis.   Clinical Impression  Pt admitted with above diagnosis. Pt currently with functional limitations due to the deficits listed below (see PT Problem List). Pt was able to partially stand and pivot from recliner to bed with +2 max assist.  Pt very motivated to get better and was walking with CNA and wifes assist and hemiwalker PTA.  Medical needs are still being assessed as unsure if pt is going to initiate dialysis today or tomorrow and d/c plans may need to be updated if pt status changes.  However, given that pt was min assist with transfers and ambulation with hemiwalker PTA, recommend Rehab and will proceed based on pts progress.  Will follow acutely.  Pt will benefit from skilled PT to increase their independence and safety with mobility to allow discharge to the venue listed below.      Follow Up Recommendations CIR;Supervision/Assistance - 24 hour    Equipment Recommendations  None recommended by PT    Recommendations for Other Services Rehab consult     Precautions / Restrictions Precautions Precautions: Fall Restrictions Weight Bearing Restrictions: No      Mobility  Bed Mobility Overal bed mobility: Needs Assistance Bed Mobility: Rolling;Sit to Sidelying Rolling: Max assist;+2 for physical assistance;Total assist       Sit to sidelying: Total assist;+2 for physical assistance General bed mobility comments: Needed max assist to roll to left due to assist needed to bend LE  and  total assist to roll right.   Pt total assist sit to sidelying to lie down.   Transfers Overall transfer level: Needs assistance Equipment used: 1 person hand held assist Transfers: Sit to/from [employer] Sit to Stand: Max assist;Total assist;+2 physical assistance   Squat pivot transfers: Max assist;+2 physical assistance     General transfer comment: Pt required max assist to power up.  Once up, pt was able to use bedrail to hold onto to partially stand.  Pt however had difficulty pivoting therefore PT and nurse assisted pt by assisting with rotating hips to sit on EOB.  Pt's left LE extends with mobility.  Flexiseal leaking therefore had to roll several times to clean pt and change all linens.   Ambulation/Gait                Stairs            Wheelchair Mobility    Modified Rankin (Stroke Patients Only) Modified Rankin (Stroke Patients Only) Pre-Morbid Rankin Score: Moderately severe disability Modified Rankin: Severe disability     Balance Overall balance assessment: Needs assistance Sitting-balance support: Bilateral upper extremity supported;Feet supported Sitting balance-Leahy Scale: Poor Sitting balance - Comments: cannot sit unsupported due to posterior lean and postural issues. Postural control: Posterior lean Standing balance support: Single extremity supported;During functional activity Standing balance-Leahy Scale: Zero Standing balance comment: REquires +2 total assist with pt =40-50% to stand partially with single UE support.  Pertinent Vitals/Pain Pain Assessment: Faces Faces Pain Scale: Hurts even more Pain Location: left UE  Pain Descriptors / Indicators: Grimacing;Guarding;Discomfort Pain Intervention(s): Limited activity within patient's tolerance;Monitored during session;Repositioned  VSS  Home Living Family/patient expects to be discharged to:: Private residence Living  Arrangements: Spouse/significant other Available Help at Discharge: Personal care attendant;Available 24 hours/day;Family(8hrs/5days) Type of Home: House Home Access: Ramped entrance     Home Layout: Full bath on main level;Laundry or work area in [employer]: Bedside commode;Shower seat;Grab bars - toilet;Grab bars - tub/shower;Wheelchair - Education administrator (comment);Hand held shower head(hemi walker )      Prior Function Level of Independence: Needs assistance   Gait / Transfers Assistance Needed: short distance AMB with HW, modI for transfers with grab bars.   ADL's / Homemaking Assistance Needed: Pt fed himself and assisted with washing his face.  Could not dress himself.   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 1/5 movement    Cervical / Trunk Assessment Cervical / Trunk Assessment: Kyphotic(extensor tone trunk)  Communication   Communication: No difficulties  Cognition Arousal/Alertness: Awake/alert Behavior During Therapy: Flat affect Overall Cognitive Status: Within Functional Limits for tasks assessed                                        General Comments      Exercises     Assessment/Plan    PT Assessment Patient needs continued PT services  PT Problem List Decreased activity tolerance;Decreased balance;Decreased mobility;Decreased knowledge of use of DME;Decreased safety awareness;Decreased knowledge of precautions;Cardiopulmonary status limiting activity;Pain;Decreased strength;Decreased range of motion       PT Treatment Interventions DME instruction;Gait  training;Functional mobility training;Therapeutic activities;Therapeutic exercise;Balance training;Patient/family education    PT Goals (Current goals can be found in the Care Plan section)  Acute Rehab PT Goals Patient Stated Goal: to get stronger and go home PT Goal Formulation: With patient Time For Goal Achievement: 04/30/17 Potential to Achieve Goals: Fair    Frequency Min 3X/week   Barriers to discharge Decreased caregiver support      Co-evaluation               AM-PAC PT "6 Clicks" Daily Activity  Outcome Measure Difficulty turning over in bed (including adjusting bedclothes, sheets and blankets)?: Unable Difficulty moving from lying on back to sitting on the side of the bed? : Unable Difficulty sitting down on and standing up from a chair with arms (e.g., wheelchair, bedside commode, etc,.)?: Unable Help needed moving to and from a bed to chair (including a wheelchair)?: Total Help needed walking in hospital room?: Total Help needed climbing 3-5 steps with a railing? : Total 6 Click Score: 6    End of Session Equipment Utilized During Treatment: Gait belt Activity Tolerance: Patient limited by fatigue Patient left: with call bell/phone within reach;in bed;with bed alarm set;with family/visitor present Nurse Communication: Mobility status;Need for lift equipment PT Visit Diagnosis: Unsteadiness on feet (R26.81);Muscle weakness (generalized) (M62.81)    Time: 9924-2683 PT Time Calculation (min) (ACUTE ONLY): 40 min   Charges:  PT Evaluation $PT Eval Moderate Complexity: 1 Mod PT Treatments $Therapeutic Activity: 23-37 mins   PT G Codes:        Macee Venables,PT Acute Rehabilitation [phone]  Denice Paradise 04/16/2017, 3:56 PM
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
## 2017-04-16 NOTE — Progress Notes (Signed)
Referring Physician(s): Dr Lake Bells  Supervising Physician: Daryll Brod  Patient Status:  Austin [name] - In-pt  Chief Complaint:  Perc chole drain placed 04/10/17  Subjective:  Pt alert today Up in bed Talkative Better daily  Allergies: Patient has no known allergies.  Medications: Prior to Admission medications   Medication Sig Start Date End Date Taking? Authorizing Provider  acetaminophen (TYLENOL) 650 MG CR tablet Take 650 mg by mouth every 8 (eight) hours as needed for pain.   Yes [provider]  co-enzyme Q-10 30 MG capsule Take 400 mg by mouth daily.    Yes [provider]  gabapentin (NEURONTIN) 100 MG capsule Take 1 capsule (100 mg total) by mouth 2 (two) times daily. 02/01/17  Yes Lyndal Pulley, DO  gabapentin (NEURONTIN) 300 MG capsule nightly 01/11/17  Yes Hulan Saas M, DO  losartan-hydrochlorothiazide (HYZAAR) 50-12.5 MG tablet Take 1 tablet by mouth daily. 11/22/16  Yes [name], MD  Misc Natural Products (TART CHERRY ADVANCED PO) Take 1,200 mg by mouth.   Yes [provider]  Multiple Vitamin (MULTIVITAMIN) tablet Take 1 tablet by mouth daily.   Yes [provider]  omeprazole (PRILOSEC) 40 MG capsule Take 1 capsule (40 mg total) by mouth daily. 05/18/16  Yes [name], MD  rosuvastatin (CRESTOR) 20 MG tablet TAKE ONE TABLET BY MOUTH ONCE DAILY. 02/22/17  Yes [name], MD  warfarin (COUMADIN) 5 MG tablet Take 1 1/2 tablets daily or as directed 10/25/16  Yes [name], MD     Vital Signs: BP (!) 155/75   Pulse 80   Temp (!) 97.4 F (36.3 C) (Oral)   Resp 15   Ht 5\' 6"  (1.676 m)   Wt 222 lb 7.1 oz (100.9 kg)   SpO2 99%   BMI 35.90 kg/m   Physical Exam  Abdominal: Soft. There is no tenderness.  Neurological: He is alert.  Skin: Skin is warm and dry.  Site is clean and dry NT no bleeding No sign of infection OP bilious 100 cc in bag 200 cc yesterday  Nursing note and vitals  reviewed.   Imaging: No results found.  Labs:  CBC: Recent Labs    04/13/17 0441 04/14/17 0417 04/15/17 0441 04/16/17 0444  WBC 17.4* 19.6* 19.4* 18.0*  HGB 10.7* 11.4* 11.5* 11.4*  HCT 30.7* 33.2* 34.7* 34.1*  PLT 62* 77* 96* 132*    COAGS: Recent Labs    04/09/17 1509  04/13/17 0441 04/14/17 0417 04/15/17 0441 04/16/17 0444  INR 2.35   < > 1.39 1.42 1.33 1.33  APTT 36  --   --   --   --   --    < > = values in this interval not displayed.    BMP: Recent Labs    04/14/17 0417 04/14/17 0844 04/15/17 0441 04/16/17 0444  NA 137 139 141 139  K 2.9* 3.0* 3.3* 3.5  CL 98* 98* 100* 96*  CO2 21* 22 23 23   GLUCOSE 119* 77 79 116*  BUN 126* 127* 139* 146*  CALCIUM 6.8* 6.9* 7.5* 7.5*  CREATININE 9.83* 9.90* 11.14* 12.03*  GFRNONAA 4* 4* 4* 3*  GFRAA 5* 5* 4* 4*    LIVER FUNCTION TESTS: Recent Labs    04/10/17 1317  04/13/17 0441 04/14/17 0417 04/14/17 0844 04/15/17 0441 04/16/17 0444  BILITOT 5.1*  --  1.5* 1.2  --  1.4*  --   AST 305*  --  41 30  --  29  --   ALT 289*  --  99* 81*  --  66*  --   ALKPHOS 152*  --  107 108  --  115  --   PROT 5.9*  --  5.1* 5.2*  --  5.6*  --   ALBUMIN 2.7*   < > 1.9* 2.1* 2.1* 2.2* 2.2*   < > = values in this interval not displayed.    Assessment and Plan:  Percutaneous cholecystostomy drain placed 3/19 Draining well Stays in placed for 6-8 weeks Will follow in OP IR Clinic   Electronically Signed: Shernita Rabinovich A, PA-C 04/16/2017, 10:28 AM   I spent a total of 15 Minutes at the the patient's bedside AND on the patient's hospital floor or unit, greater than 50% of which was counseling/coordinating care for perc chole drain

## 2017-04-16 NOTE — Progress Notes (Signed)
Rehab Admissions Coordinator Note:  Patient was screened by Cleatrice Burke for appropriateness for an Inpatient Acute Rehab Consult per PT recommendation.   At this time, we are recommending await further medical progress before proceeding with dispo options. I will follow.Cleatrice Burke 04/16/2017, 4:05 PM  I can be reached at [phone].
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
## 2017-04-16 NOTE — Progress Notes (Signed)
Pt's Grand-daughter called to check on pt, requesting lab results, this RN informed her that the doctor usually give out results of test and that I can put in a sticky note for the doctor to give her a call tomorrow, she was however reassured that the pt was okay and stable at this time, pt also informed that his grand-daughter called to check on him and was reassured as well, will continue to monitor. Obasogie-Asidi, Taven Strite Efe

## 2017-04-16 NOTE — Progress Notes (Signed)
Palliative care consult received. I have reviewed his chart in detail and will arrange for a family meeting to discuss goals of care. Extubated 3/22 after acute decompensation in the ED and ICU stay for septic shock related to acute cholecystitis. Question if he will need long term hemodialysis now and need to get some clarity around acceptable QOL for him.Of note in the ED documentation there is a note that says he has a DNR order, but at the time they elected to move forward with a time limited trial of aggressive intervention. Plan to schedule a meeting at earliest possible available time.  Lane Hacker, DO Palliative Medicine [phone]

## 2017-04-16 NOTE — Progress Notes (Signed)
Received visitation request from family members. I visited with the patient and family and offered spiritual support. Led in prayer with the patient and family members. Chaplain is available for follow up visit if needed.    04/16/17 1700  Clinical Encounter Type  Visited With Patient and family together  Visit Type Spiritual support  Referral From Patient;Family  Consult/Referral To Chaplain  Spiritual Encounters  Spiritual Needs Prayer  Stress Factors  Patient Stress Factors None identified  Family Stress Factors None identified   Redgie Grayer

## 2017-04-17 LAB — CBC WITH DIFFERENTIAL/PLATELET
Basophils Absolute: 0 10*3/uL (ref 0.0–0.1)
Basophils Relative: 0 %
Eosinophils Absolute: 0.8 10*3/uL — ABNORMAL HIGH (ref 0.0–0.7)
Eosinophils Relative: 4 %
HCT: 33.1 % — ABNORMAL LOW (ref 39.0–52.0)
Hemoglobin: 11.2 g/dL — ABNORMAL LOW (ref 13.0–17.0)
Lymphocytes Relative: 7 %
Lymphs Abs: 1.3 10*3/uL (ref 0.7–4.0)
MCH: 30.7 pg (ref 26.0–34.0)
MCHC: 33.8 g/dL (ref 30.0–36.0)
MCV: 90.7 fL (ref 78.0–100.0)
Monocytes Absolute: 1.5 10*3/uL — ABNORMAL HIGH (ref 0.1–1.0)
Monocytes Relative: 8 %
Neutro Abs: 15 10*3/uL — ABNORMAL HIGH (ref 1.7–7.7)
Neutrophils Relative %: 81 %
Platelets: 157 10*3/uL (ref 150–400)
RBC: 3.65 MIL/uL — ABNORMAL LOW (ref 4.22–5.81)
RDW: 14.8 % (ref 11.5–15.5)
WBC: 18.5 10*3/uL — ABNORMAL HIGH (ref 4.0–10.5)

## 2017-04-17 LAB — GLUCOSE, CAPILLARY
Glucose-Capillary: 105 mg/dL — ABNORMAL HIGH (ref 65–99)
Glucose-Capillary: 125 mg/dL — ABNORMAL HIGH (ref 65–99)
Glucose-Capillary: 76 mg/dL (ref 65–99)
Glucose-Capillary: 81 mg/dL (ref 65–99)
Glucose-Capillary: 83 mg/dL (ref 65–99)
Glucose-Capillary: 93 mg/dL (ref 65–99)
Glucose-Capillary: 94 mg/dL (ref 65–99)

## 2017-04-17 LAB — RENAL FUNCTION PANEL
Albumin: 2.3 g/dL — ABNORMAL LOW (ref 3.5–5.0)
Anion gap: 22 — ABNORMAL HIGH (ref 5–15)
BUN: 151 mg/dL — ABNORMAL HIGH (ref 6–20)
CO2: 22 mmol/L (ref 22–32)
Calcium: 7.6 mg/dL — ABNORMAL LOW (ref 8.9–10.3)
Chloride: 97 mmol/L — ABNORMAL LOW (ref 101–111)
Creatinine, Ser: 12.83 mg/dL — ABNORMAL HIGH (ref 0.61–1.24)
GFR calc Af Amer: 4 mL/min — ABNORMAL LOW (ref >60)
GFR calc non Af Amer: 3 mL/min — ABNORMAL LOW (ref >60)
Glucose, Bld: 90 mg/dL (ref 65–99)
Phosphorus: 7.3 mg/dL — ABNORMAL HIGH (ref 2.5–4.6)
Potassium: 3.6 mmol/L (ref 3.5–5.1)
Sodium: 141 mmol/L (ref 135–145)

## 2017-04-17 LAB — PROTIME-INR
INR: 1.23
Prothrombin Time: 15.4 seconds — ABNORMAL HIGH (ref 11.4–15.2)

## 2017-04-17 NOTE — Care Management Important Message (Signed)
Important Message  Patient Details  Name: Austin [name] MRN: [id number] Date of Birth: February 14, 1932   Medicare Important Message Given:  Yes    [name] 04/17/2017, 1:42 PM

## 2017-04-17 NOTE — Evaluation (Signed)
Occupational Therapy Evaluation Patient Details Name: Austin [name] MRN: [id number] DOB: 1932-02-09 Today's Date: 04/17/2017    History of Present Illness  84 yo male admitted with N/V x 2 days, fever x 1 day with septic shock and bacteremia in setting of acute cholecystitis, oligruic AKI on CKD, VDRF. Pt with hx of HTN, HLD, CKD, adenocarcinoma of prostate (2020), paralysis from [place] (2015).  cholecytostomy tube placed 3/19.  Pt needs dialysis currently and is not a candidate for long term dialysis.    Clinical Impression   PTA, pt was living with his wife and required assistance for ADLs and functional transfers. Pt was able to perform basic transfers with hemi walker. Pt requiring Min A for grooming/self feeding and Max-Total A for bathing, dressing, and toileting. Pt highly motivated to participate in therapy and return to PLOF. Pt and wife agree this is not pt's functional baseline and is a "set back" for him. Pt demonstrating decreased strength, balance, and functional use of LUE. Pt would benefit from further acute OT to facilitate safe dc. Recommend dc to CIR for further intensive OT to optimize safety, independence with ADLs, and return to PLOF.      Follow Up Recommendations  CIR;Supervision/Assistance - 24 hour    Equipment Recommendations  Other (comment)(Defer to next venue)    Recommendations for Other Services Rehab consult;PT consult     Precautions / Restrictions Precautions Precautions: Fall Restrictions Weight Bearing Restrictions: No      Mobility Bed Mobility Overal bed mobility: Needs Assistance Bed Mobility: Rolling Rolling: Max assist;+2 for physical assistance;Total assist         General bed mobility comments: Pt requiring Max A +2 to roll toward left and right during toilet hygiene. Pt abel to reach RUE and hold onto bedrail demosntrating increase motivation and participation during ADL. Due to decreased funcitonal use of LUE, pt requiring increase  assistance to roll right.  Transfers Overall transfer level: Needs assistance               General transfer comment: Pt highly motivated to perform OOB activity and sit up in chair. Pt participating to manage LUE with RUE during use of maximove to recliner.     Balance Overall balance assessment: Needs assistance Sitting-balance support: Bilateral upper extremity supported;Feet supported Sitting balance-Leahy Scale: Poor Sitting balance - Comments: Requiring support on right side for right lean in chair. Postural control: Right lateral lean                                 ADL either performed or assessed with clinical judgement   ADL Overall ADL's : Needs assistance/impaired Eating/Feeding: Minimal assistance;Sitting   Grooming: Wash/dry face;Set up;Sitting Grooming Details (indicate cue type and reason): supported sitting in recliner. Able to use RUE to wash his face. Upper Body Bathing: Maximal assistance;Bed level   Lower Body Bathing: Total assistance;Bed level;+2 for physical assistance   Upper Body Dressing : Maximal assistance;Sitting   Lower Body Dressing: Total assistance;+2 for physical assistance;Bed level       Toileting- Clothing Manipulation and Hygiene: Total assistance;+2 for physical assistance;Bed level Toileting - Clothing Manipulation Details (indicate cue type and reason): Pt requiring Max A to roll towards left; able to hold onto bed rail with RUE with Max A to maintain position. Second person performing toilet hygiene.       General ADL Comments: Pt performing rolling left and right to  perform toilet hygiene and place lift pad. Using lift pad for safe transfer to recliner. Pt highly motivated to perform OOB activity     Vision Baseline Vision/History: Wears glasses Wears Glasses: Reading only Patient Visual Report: No change from baseline       Perception     Praxis      Pertinent Vitals/Pain Pain Assessment: Faces Faces  Pain Scale: Hurts even more Pain Location: LUE movement Pain Descriptors / Indicators: Grimacing;Guarding;Discomfort Pain Intervention(s): Monitored during session;Repositioned     Hand Dominance Right   Extremity/Trunk Assessment Upper Extremity Assessment Upper Extremity Assessment: LUE deficits/detail LUE Deficits / Details: LUE flexing during pain reactions; especially during PROM of LUE. Rigid UE with hand slight contracted in a composite flexion of fingers.  LUE: Unable to fully assess due to pain LUE Coordination: decreased fine motor;decreased gross motor   Lower Extremity Assessment Lower Extremity Assessment: Defer to PT evaluation LLE Deficits / Details: extensor tone noted. 1/5 movement   Cervical / Trunk Assessment Cervical / Trunk Assessment: Kyphotic(extensor tone trunk)   Communication Communication Communication: No difficulties   Cognition Arousal/Alertness: Awake/alert Behavior During Therapy: WFL for tasks assessed/performed Overall Cognitive Status: Within Functional Limits for tasks assessed                                     General Comments  Wife present throughout session. RN and NT assisted    Exercises     Shoulder Instructions      Home Living Family/patient expects to be discharged to:: Private residence Living Arrangements: Spouse/significant other Available Help at Discharge: Personal care attendant;Available 24 hours/day;Family(8hrs/5days) Type of Home: House Home Access: Ramped entrance     Home Layout: Full bath on main level;Laundry or work area in Consulting civil engineer Shower/Tub: Teacher, early years/pre: [place]: Bedside commode;Shower seat;Grab bars - toilet;Grab bars - tub/shower;Wheelchair - Education administrator (comment);Hand held shower head(hemi walker )          Prior Functioning/Environment Level of Independence: Needs assistance  Gait / Transfers Assistance  Needed: short distance AMB with HW, modI for transfers with grab bars.  ADL's / Homemaking Assistance Needed: Pt fed himself and assisted with washing his face.  Could not dress himself.    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        OT Problem List: Decreased strength;Decreased range of motion;Decreased activity tolerance;Impaired balance (sitting and/or standing);Impaired UE functional use;Pain      OT Treatment/Interventions: Self-care/ADL training;Therapeutic exercise;Energy conservation;DME and/or AE instruction;Therapeutic activities;Patient/family education    OT Goals(Current goals can be found in the care plan section) Acute Rehab OT Goals Patient Stated Goal: to get stronger and go home OT Goal Formulation: With patient/family Time For Goal Achievement: 05/01/17 Potential to Achieve Goals: Good ADL Goals Pt Will Perform Eating: with set-up;with supervision;sitting Pt Will Perform Grooming: with set-up;with supervision;sitting Pt Will Transfer to Toilet: with min assist;stand pivot transfer;bedside commode;with +2 assist(hemiwalker) Additional ADL Goal #1: Pt will perform bed mobility with Min A +2 in preparation for ADLs Additional ADL Goal #2: Pt will maintain sitting posture with Min A for balance during ADLs  OT Frequency: Min 3X/week   Barriers to D/C:            Co-evaluation              AM-PAC PT "6 Clicks" Daily Activity  Outcome Measure Help from another person eating meals?: A Little Help from another person taking care of personal grooming?: A Little Help from another person toileting, which includes using toliet, bedpan, or urinal?: A Lot Help from another person bathing (including washing, rinsing, drying)?: A Lot Help from another person to put on and taking off regular upper body clothing?: A Lot Help from another person to put on and taking off regular lower body clothing?: A Lot 6 Click Score: 14   End of Session Equipment Utilized During  Treatment: Other (comment)(Maximove) Nurse Communication: Mobility status;Need for lift equipment  Activity Tolerance: Patient tolerated treatment well Patient left: in chair;with call bell/phone within reach;with family/visitor present  OT Visit Diagnosis: Unsteadiness on feet (R26.81);Other abnormalities of gait and mobility (R26.89);Muscle weakness (generalized) (M62.81);Other symptoms and signs involving cognitive function;Hemiplegia and hemiparesis;Pain Hemiplegia - Right/Left: Left Hemiplegia - dominant/non-dominant: Non-Dominant Hemiplegia - caused by: Unspecified(Prior CVA) Pain - Right/Left: Left Pain - part of body: Arm                Time: 4514-6047 OT Time Calculation (min): 26 min Charges:  OT General Charges $OT Visit: 1 Visit OT Evaluation $OT Eval Moderate Complexity: 1 Mod OT Treatments $Self Care/Home Management : 8-22 mins G-Codes:     Hazel, OTR/L Acute Rehab Pager: [phone] Office: [place] 04/17/2017, 5:15 PM
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
## 2017-04-18 ENCOUNTER — Inpatient Hospital Stay (HOSPITAL_COMMUNITY): Payer: Medicare Other

## 2017-04-18 DIAGNOSIS — K810 Acute cholecystitis: Secondary | ICD-10-CM

## 2017-04-18 DIAGNOSIS — Z452 Encounter for adjustment and management of vascular access device: Secondary | ICD-10-CM

## 2017-04-18 DIAGNOSIS — Z01818 Encounter for other preprocedural examination: Secondary | ICD-10-CM

## 2017-04-18 LAB — GLUCOSE, CAPILLARY
Glucose-Capillary: 83 mg/dL (ref 65–99)
Glucose-Capillary: 82 mg/dL (ref 65–99)
Glucose-Capillary: 83 mg/dL (ref 65–99)
Glucose-Capillary: 136 mg/dL — ABNORMAL HIGH (ref 65–99)
Glucose-Capillary: 100 mg/dL — ABNORMAL HIGH (ref 65–99)

## 2017-04-18 LAB — CBC WITH DIFFERENTIAL/PLATELET
Basophils Absolute: 0 10*3/uL (ref 0.0–0.1)
Basophils Relative: 0 %
Eosinophils Absolute: 0.6 10*3/uL (ref 0.0–0.7)
Eosinophils Relative: 4 %
HCT: 31.4 % — ABNORMAL LOW (ref 39.0–52.0)
Hemoglobin: 10.6 g/dL — ABNORMAL LOW (ref 13.0–17.0)
Lymphocytes Relative: 9 %
Lymphs Abs: 1.4 10*3/uL (ref 0.7–4.0)
MCH: 30.8 pg (ref 26.0–34.0)
MCHC: 33.8 g/dL (ref 30.0–36.0)
MCV: 91.3 fL (ref 78.0–100.0)
Monocytes Absolute: 1 10*3/uL (ref 0.1–1.0)
Monocytes Relative: 6 %
Neutro Abs: 13.4 10*3/uL — ABNORMAL HIGH (ref 1.7–7.7)
Neutrophils Relative %: 81 %
Platelets: 182 10*3/uL (ref 150–400)
RBC: 3.44 MIL/uL — ABNORMAL LOW (ref 4.22–5.81)
RDW: 14.9 % (ref 11.5–15.5)
WBC: 16.4 10*3/uL — ABNORMAL HIGH (ref 4.0–10.5)

## 2017-04-18 LAB — PROTIME-INR
INR: 1.22
Prothrombin Time: 15.3 seconds — ABNORMAL HIGH (ref 11.4–15.2)

## 2017-04-18 LAB — RENAL FUNCTION PANEL
Albumin: 2.3 g/dL — ABNORMAL LOW (ref 3.5–5.0)
Anion gap: 23 — ABNORMAL HIGH (ref 5–15)
BUN: 152 mg/dL — ABNORMAL HIGH (ref 6–20)
CO2: 21 mmol/L — ABNORMAL LOW (ref 22–32)
Calcium: 7.6 mg/dL — ABNORMAL LOW (ref 8.9–10.3)
Chloride: 97 mmol/L — ABNORMAL LOW (ref 101–111)
Creatinine, Ser: 13.34 mg/dL — ABNORMAL HIGH (ref 0.61–1.24)
GFR calc Af Amer: 3 mL/min — ABNORMAL LOW (ref >60)
GFR calc non Af Amer: 3 mL/min — ABNORMAL LOW (ref >60)
Glucose, Bld: 91 mg/dL (ref 65–99)
Phosphorus: 8 mg/dL — ABNORMAL HIGH (ref 2.5–4.6)
Potassium: 3.7 mmol/L (ref 3.5–5.1)
Sodium: 141 mmol/L (ref 135–145)

## 2017-04-18 NOTE — Progress Notes (Signed)
   04/18/17 1300  Clinical Encounter Type  Visited With Patient and family together  Visit Type Spiritual support  Consult/Referral To Chaplain  Spiritual Encounters  Spiritual Needs Prayer  Stress Factors  Patient Stress Factors None identified  Family Stress Factors None identified  Chaplain visited with the PT at request of the nurse.  The PT was up sitting in chair and wife was at bedside.  PT had been visited by multiple Chaplains and family pastor is one of those Chaplains.
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
## 2017-04-18 NOTE — Progress Notes (Signed)
Physical Therapy Treatment Patient Details Name: Austin [name] MRN: [id number] DOB: 30-Oct-1932 Today's Date: 04/18/2017    History of Present Illness  84 yo male admitted with N/V x 2 days, fever x 1 day with septic shock and bacteremia in setting of acute cholecystitis, oligruic AKI on CKD, VDRF. Pt with hx of HTN, HLD, CKD, adenocarcinoma of prostate (2020), paralysis from [place] (2015).  cholecytostomy tube placed 3/19.  Pt needs dialysis currently and is not a candidate for long term dialysis.     PT Comments    Pt up in chair on arrival and wishing to remain OOB. Today's session focused on providing pt with HEP to perform while admitted. RN reports she transferred pt to recliner via maxi move this AM and he was total assist. Will continue to follow to progress safety with mobility and functional independence.     Follow Up Recommendations  CIR;Supervision/Assistance - 24 hour     Equipment Recommendations  None recommended by PT    Recommendations for Other Services Rehab consult     Precautions / Restrictions Precautions Precautions: Fall Restrictions Weight Bearing Restrictions: No    Mobility  Bed Mobility               General bed mobility comments: In chair on arrival  Transfers                    Ambulation/Gait             General Gait Details: Unable at this time   Stairs            Wheelchair Mobility    Modified Rankin (Stroke Patients Only) Modified Rankin (Stroke Patients Only) Pre-Morbid Rankin Score: Moderately severe disability Modified Rankin: Severe disability     Balance Overall balance assessment: Needs assistance Sitting-balance support: Bilateral upper extremity supported;Feet supported Sitting balance-Leahy Scale: Poor Sitting balance - Comments: Requiring support on right side for right lean in chair. Postural control: Right lateral lean   Standing balance-Leahy Scale: Zero                               Cognition Arousal/Alertness: Awake/alert Behavior During Therapy: WFL for tasks assessed/performed Overall Cognitive Status: Within Functional Limits for tasks assessed                                        Exercises Total Joint Exercises Ankle Circles/Pumps: AAROM;Both;10 reps;Seated(assist for LLE secondary to paralysis) Heel Slides: AAROM;Both;10 reps;Seated(assist for LLE secondary to paralysis) Hip ABduction/ADduction: AAROM;Both;10 reps;Seated(assist for LLE secondary to paralysis) General Exercises - Upper Extremity Shoulder Flexion: AROM;Right;10 reps;Seated(Too painful to attempt on L side) Elbow Flexion: AROM;Right;10 reps;Seated(Too painful to attempt on L side)    General Comments General comments (skin integrity, edema, etc.): Pt in chair on arrival. Rn reports she transfered pt via maxi move as he is total assist.      Pertinent Vitals/Pain Pain Assessment: Faces Faces Pain Scale: Hurts even more Pain Location: LUE movement Pain Descriptors / Indicators: Grimacing;Guarding;Discomfort Pain Intervention(s): Monitored during session;Limited activity within patient's tolerance    Home Living                      Prior Function            PT Goals (current goals can  now be found in the care plan section) Acute Rehab PT Goals Patient Stated Goal: to get stronger and go home PT Goal Formulation: With patient Time For Goal Achievement: 04/30/17 Potential to Achieve Goals: Fair Progress towards PT goals: Progressing toward goals    Frequency    Min 3X/week      PT Plan Current plan remains appropriate    Co-evaluation              AM-PAC PT "6 Clicks" Daily Activity  Outcome Measure  Difficulty turning over in bed (including adjusting bedclothes, sheets and blankets)?: Unable Difficulty moving from lying on back to sitting on the side of the bed? : Unable Difficulty sitting down on and standing up from a chair  with arms (e.g., wheelchair, bedside commode, etc,.)?: Unable Help needed moving to and from a bed to chair (including a wheelchair)?: Total Help needed walking in hospital room?: Total Help needed climbing 3-5 steps with a railing? : Total 6 Click Score: 6    End of Session   Activity Tolerance: Patient limited by pain;Patient tolerated treatment well Patient left: with call bell/phone within reach;in chair;with chair alarm set Nurse Communication: Mobility status PT Visit Diagnosis: Unsteadiness on feet (R26.81);Muscle weakness (generalized) (M62.81)     Time: 2244-9753 PT Time Calculation (min) (ACUTE ONLY): 15 min  Charges:  $Therapeutic Exercise: 8-22 mins                    G Codes:       Benjiman Core, [place] Pager [phone] Acute Rehab   Allena Katz 04/18/2017, 10:31 AM
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
## 2017-04-18 NOTE — Procedures (Signed)
Hemodialysis Catheter Insertion Procedure Note Austin [name] [id number] [birth year]  Procedure: Insertion of Hemodialysis Catheter Indications: Hemodialysis  Procedure Details Consent: Risks of procedure as well as the alternatives and risks of each were explained to the (patient/caregiver).  Consent for procedure obtained. Time Out: Verified patient identification, verified procedure, site/side was marked, verified correct patient position, special equipment/implants available, medications/allergies/relevent history reviewed, required imaging and test results available.  Performed  Maximum sterile technique was used including antiseptics, cap, gloves, gown, hand hygiene, mask and sheet. Skin prep: Chlorhexidine; local anesthetic administered A antimicrobial bonded/coated triple lumen catheter was placed in the left internal jugular vein using the Seldinger technique.  Evaluation Blood flow good Complications: No apparent complications Patient did tolerate procedure well. Chest X-ray ordered to verify placement.  CXR: pending.  Procedure performed under direct ultrasound guidance for real time vessel cannulation.      Montey Hora, [facility] Pgr: [phone]  or [phone] 04/18/2017, 5:02 PM

## 2017-04-19 LAB — CBC WITH DIFFERENTIAL/PLATELET
Basophils Absolute: 0 10*3/uL (ref 0.0–0.1)
Basophils Relative: 0 %
Eosinophils Absolute: 0.5 10*3/uL (ref 0.0–0.7)
Eosinophils Relative: 3 %
HCT: 32.6 % — ABNORMAL LOW (ref 39.0–52.0)
Hemoglobin: 10.6 g/dL — ABNORMAL LOW (ref 13.0–17.0)
Lymphocytes Relative: 6 %
Lymphs Abs: 0.9 10*3/uL (ref 0.7–4.0)
MCH: 30.1 pg (ref 26.0–34.0)
MCHC: 32.5 g/dL (ref 30.0–36.0)
MCV: 92.6 fL (ref 78.0–100.0)
Monocytes Absolute: 1.3 10*3/uL — ABNORMAL HIGH (ref 0.1–1.0)
Monocytes Relative: 8 %
Neutro Abs: 12.7 10*3/uL — ABNORMAL HIGH (ref 1.7–7.7)
Neutrophils Relative %: 83 %
Platelets: 232 10*3/uL (ref 150–400)
RBC: 3.52 MIL/uL — ABNORMAL LOW (ref 4.22–5.81)
RDW: 15.1 % (ref 11.5–15.5)
WBC: 15.3 10*3/uL — ABNORMAL HIGH (ref 4.0–10.5)

## 2017-04-19 LAB — RENAL FUNCTION PANEL
Albumin: 2.5 g/dL — ABNORMAL LOW (ref 3.5–5.0)
Anion gap: 22 — ABNORMAL HIGH (ref 5–15)
BUN: 154 mg/dL — ABNORMAL HIGH (ref 6–20)
CO2: 20 mmol/L — ABNORMAL LOW (ref 22–32)
Calcium: 7.6 mg/dL — ABNORMAL LOW (ref 8.9–10.3)
Chloride: 99 mmol/L — ABNORMAL LOW (ref 101–111)
Creatinine, Ser: 14.08 mg/dL — ABNORMAL HIGH (ref 0.61–1.24)
GFR calc Af Amer: 3 mL/min — ABNORMAL LOW (ref >60)
GFR calc non Af Amer: 3 mL/min — ABNORMAL LOW (ref >60)
Glucose, Bld: 110 mg/dL — ABNORMAL HIGH (ref 65–99)
Phosphorus: 8.8 mg/dL — ABNORMAL HIGH (ref 2.5–4.6)
Potassium: 3.9 mmol/L (ref 3.5–5.1)
Sodium: 141 mmol/L (ref 135–145)

## 2017-04-19 LAB — GLUCOSE, CAPILLARY
Glucose-Capillary: 101 mg/dL — ABNORMAL HIGH (ref 65–99)
Glucose-Capillary: 115 mg/dL — ABNORMAL HIGH (ref 65–99)
Glucose-Capillary: 140 mg/dL — ABNORMAL HIGH (ref 65–99)
Glucose-Capillary: 212 mg/dL — ABNORMAL HIGH (ref 65–99)
Glucose-Capillary: 81 mg/dL (ref 65–99)
Glucose-Capillary: 99 mg/dL (ref 65–99)

## 2017-04-19 MED ORDER — ALTEPLASE 2 MG IJ SOLR
2.0000 mg | Freq: Once | INTRAMUSCULAR | Status: DC | PRN
  Filled 2017-04-19: qty 2

## 2017-04-19 MED ORDER — SODIUM CHLORIDE 0.9 % IV SOLN: 100.0000 mL | INTRAVENOUS | Status: DC | PRN

## 2017-04-19 MED ORDER — HEPARIN SODIUM (PORCINE) 1000 UNIT/ML DIALYSIS
1000.0000 [IU] | INTRAMUSCULAR | Status: DC | PRN
  Filled 2017-04-19: qty 1

## 2017-04-19 MED ORDER — LIDOCAINE HCL (PF) 1 % IJ SOLN: 5.0000 mL | INTRAMUSCULAR | Status: DC | PRN

## 2017-04-19 MED ORDER — PENTAFLUOROPROP-TETRAFLUOROETH EX AERO: 1.0000 "application " | INHALATION_SPRAY | CUTANEOUS | Status: DC | PRN

## 2017-04-19 MED ORDER — LIDOCAINE-PRILOCAINE 2.5-2.5 % EX CREA
1.0000 "application " | TOPICAL_CREAM | CUTANEOUS | Status: DC | PRN
  Filled 2017-04-19: qty 5
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
## 2017-04-19 NOTE — Progress Notes (Signed)
Pt still in dialysis at this time. Obasogie-Asidi, Avni Traore Efe

## 2017-04-19 NOTE — Progress Notes (Signed)
Pt came back from dialysis, alert and oriented, settled in room with visiting family, night meds given accordingly, v/e stable, will however continue to monitor. Obasogie-Asidi, Austin [name]
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
## 2017-04-19 NOTE — Progress Notes (Signed)
Report given to Dialysis. Family aware of patient going to HD at this time.   Ave Filter, RN

## 2017-04-19 NOTE — Progress Notes (Signed)
Referring Physician(s): Dr. Jimmey Ralph  Supervising Physician: Marybelle Killings  Patient Status:  [facility] - In-pt  Chief Complaint: Acute cholecystitis  Subjective: Extubated, now on floor.  Doing better.  Conversant.   Allergies: Patient has no known allergies.  Medications: Prior to Admission medications   Medication Sig Start Date End Date Taking? Authorizing Provider  acetaminophen (TYLENOL) 650 MG CR tablet Take 650 mg by mouth every 8 (eight) hours as needed for pain.   Yes [provider]  co-enzyme Q-10 30 MG capsule Take 400 mg by mouth daily.    Yes [provider]  gabapentin (NEURONTIN) 100 MG capsule Take 1 capsule (100 mg total) by mouth 2 (two) times daily. 02/01/17  Yes Lyndal Pulley, DO  gabapentin (NEURONTIN) 300 MG capsule nightly 01/11/17  Yes Hulan Saas M, DO  losartan-hydrochlorothiazide (HYZAAR) 50-12.5 MG tablet Take 1 tablet by mouth daily. 11/22/16  Yes [name], MD  Misc Natural Products (TART CHERRY ADVANCED PO) Take 1,200 mg by mouth.   Yes [provider]  Multiple Vitamin (MULTIVITAMIN) tablet Take 1 tablet by mouth daily.   Yes [provider]  omeprazole (PRILOSEC) 40 MG capsule Take 1 capsule (40 mg total) by mouth daily. 05/18/16  Yes [name], MD  rosuvastatin (CRESTOR) 20 MG tablet TAKE ONE TABLET BY MOUTH ONCE DAILY. 02/22/17  Yes [name], MD  warfarin (COUMADIN) 5 MG tablet Take 1 1/2 tablets daily or as directed 10/25/16  Yes [name], MD     Vital Signs: BP (!) 145/64 (BP Location: Left Arm)   Pulse 85   Temp (!) 97.5 F (36.4 C) (Oral)   Resp 20   Ht 5' 6"  (1.676 m)   Wt 224 lb 13.9 oz (102 kg)   SpO2 97%   BMI 36.29 kg/m   Physical Exam  Constitutional: He appears well-developed.  Nursing note and vitals reviewed. Abd:  Percutaneous cholecystostomy in place.  Insertion site c/d/i. Dark, bilious output in bag.   Imaging: Dg Chest Portable 1 View  Result Date:  04/18/2017 CLINICAL DATA:  Check central line placement EXAM: PORTABLE CHEST 1 VIEW COMPARISON:  04/11/2017 FINDINGS: Endotracheal tube and nasogastric catheter have been removed in the interval. Cardiac shadow is stable. Left jugular central line is noted with the tip in the mid superior vena cava. No pneumothorax is seen. No focal infiltrate is noted. IMPRESSION: No pneumothorax following central line placement Electronically Signed   By: Inez Catalina M.D.   On: 04/18/2017 17:28    Labs:  CBC: Recent Labs    04/15/17 0441 04/16/17 0444 04/17/17 0717 04/18/17 0412  WBC 19.4* 18.0* 18.5* 16.4*  HGB 11.5* 11.4* 11.2* 10.6*  HCT 34.7* 34.1* 33.1* 31.4*  PLT 96* 132* 157 182    COAGS: Recent Labs    04/09/17 1509  04/15/17 0441 04/16/17 0444 04/17/17 0717 04/18/17 0412  INR 2.35   < > 1.33 1.33 1.23 1.22  APTT 36  --   --   --   --   --    < > = values in this interval not displayed.    BMP: Recent Labs    04/15/17 0441 04/16/17 0444 04/17/17 0717 04/18/17 0412  NA 141 139 141 141  K 3.3* 3.5 3.6 3.7  CL 100* 96* 97* 97*  CO2 23 23 22  21*  GLUCOSE 79 116* 90 91  BUN 139* 146* 151* 152*  CALCIUM 7.5* 7.5* 7.6* 7.6*  CREATININE 11.14*  12.03* 12.83* 13.34*  GFRNONAA 4* 3* 3* 3*  GFRAA 4* 4* 4* 3*    LIVER FUNCTION TESTS: Recent Labs    04/10/17 1317  04/13/17 0441 04/14/17 0417  04/15/17 0441 04/16/17 0444 04/17/17 0717 04/18/17 0412  BILITOT 5.1*  --  1.5* 1.2  --  1.4*  --   --   --   AST 305*  --  41 30  --  29  --   --   --   ALT 289*  --  99* 81*  --  66*  --   --   --   ALKPHOS 152*  --  107 108  --  115  --   --   --   PROT 5.9*  --  5.1* 5.2*  --  5.6*  --   --   --   ALBUMIN 2.7*   < > 1.9* 2.1*   < > 2.2* 2.2* 2.3* 2.3*   < > = values in this interval not displayed.    Assessment and Plan: Acute cholecystitis s/p drain placement 3/19 Patient continues to improve after drain placement.  On floor today working with therapy.  Cholecystostomy  functioning well.  Continue current management. IR to follow periodically.  Electronically Signed: Docia Barrier, PA 04/19/2017, 12:49 PM   I spent a total of 15 Minutes at the the patient's bedside AND on the patient's hospital floor or unit, greater than 50% of which was counseling/coordinating care for acute cholecystitis.

## 2017-04-20 LAB — GLUCOSE, CAPILLARY
Glucose-Capillary: 100 mg/dL — ABNORMAL HIGH (ref 65–99)
Glucose-Capillary: 103 mg/dL — ABNORMAL HIGH (ref 65–99)
Glucose-Capillary: 154 mg/dL — ABNORMAL HIGH (ref 65–99)
Glucose-Capillary: 87 mg/dL (ref 65–99)
Glucose-Capillary: 92 mg/dL (ref 65–99)
Glucose-Capillary: 97 mg/dL (ref 65–99)

## 2017-04-20 LAB — RENAL FUNCTION PANEL
Albumin: 2.5 g/dL — ABNORMAL LOW (ref 3.5–5.0)
Anion gap: 17 — ABNORMAL HIGH (ref 5–15)
BUN: 91 mg/dL — ABNORMAL HIGH (ref 6–20)
CO2: 22 mmol/L (ref 22–32)
Calcium: 8 mg/dL — ABNORMAL LOW (ref 8.9–10.3)
Chloride: 103 mmol/L (ref 101–111)
Creatinine, Ser: 10.1 mg/dL — ABNORMAL HIGH (ref 0.61–1.24)
GFR calc Af Amer: 5 mL/min — ABNORMAL LOW (ref >60)
GFR calc non Af Amer: 4 mL/min — ABNORMAL LOW (ref >60)
Glucose, Bld: 87 mg/dL (ref 65–99)
Phosphorus: 7.3 mg/dL — ABNORMAL HIGH (ref 2.5–4.6)
Potassium: 4 mmol/L (ref 3.5–5.1)
Sodium: 142 mmol/L (ref 135–145)

## 2017-04-20 LAB — CBC WITH DIFFERENTIAL/PLATELET
Basophils Absolute: 0 10*3/uL (ref 0.0–0.1)
Basophils Relative: 0 %
Eosinophils Absolute: 0.6 10*3/uL (ref 0.0–0.7)
Eosinophils Relative: 4 %
HCT: 32.8 % — ABNORMAL LOW (ref 39.0–52.0)
Hemoglobin: 10.5 g/dL — ABNORMAL LOW (ref 13.0–17.0)
Lymphocytes Relative: 6 %
Lymphs Abs: 0.9 10*3/uL (ref 0.7–4.0)
MCH: 30.3 pg (ref 26.0–34.0)
MCHC: 32 g/dL (ref 30.0–36.0)
MCV: 94.5 fL (ref 78.0–100.0)
Monocytes Absolute: 1.4 10*3/uL — ABNORMAL HIGH (ref 0.1–1.0)
Monocytes Relative: 10 %
Neutro Abs: 12 10*3/uL — ABNORMAL HIGH (ref 1.7–7.7)
Neutrophils Relative %: 80 %
Platelets: 228 10*3/uL (ref 150–400)
RBC: 3.47 MIL/uL — ABNORMAL LOW (ref 4.22–5.81)
RDW: 15.2 % (ref 11.5–15.5)
WBC: 14.9 10*3/uL — ABNORMAL HIGH (ref 4.0–10.5)

## 2017-04-20 MED ORDER — HEPARIN (PORCINE) IN NACL 100-0.45 UNIT/ML-% IJ SOLN
1450.0000 [IU]/h | INTRAMUSCULAR | Status: DC
  Administered 2017-04-20: 1200 [IU]/h via INTRAVENOUS
  Administered 2017-04-22: 900 [IU]/h via INTRAVENOUS
  Administered 2017-04-23: 1250 [IU]/h via INTRAVENOUS
  Administered 2017-04-24 – 2017-04-27 (×4): 1400 [IU]/h via INTRAVENOUS
  Filled 2017-04-20 (×9): qty 250
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
## 2017-04-20 NOTE — Progress Notes (Signed)
Physical Therapy Treatment Patient Details Name: Austin [name] MRN: [id number] DOB: October 17, 1932 Today's Date: 04/20/2017    History of Present Illness  84 yo male admitted with N/V x 2 days, fever x 1 day with septic shock and bacteremia in setting of acute cholecystitis, oligruic AKI on CKD, VDRF. Pt with hx of HTN, HLD, CKD, adenocarcinoma of prostate (2020), paralysis from [place] (2015).  cholecytostomy tube placed 3/19.  Pt needs dialysis currently and is not a candidate for long term dialysis.     PT Comments    Patient treatment limited by patient fatigue. Able to sit edge of bed for 15 minutes and participate in sitting balance activities including functional reaching; able to sit unsupported for < 5 seconds at a time. Attempted sit to stand transfer with total A + 2 but patient unable to achieve significant hip clearance to try a squat pivot to the recliner. Discussed with NT to maxi move from bed to chair.     Follow Up Recommendations  Supervision/Assistance - 24 hour;SNF     Equipment Recommendations  None recommended by PT    Recommendations for Other Services       Precautions / Restrictions Precautions Precautions: Fall Restrictions Weight Bearing Restrictions: No    Mobility  Bed Mobility Overal bed mobility: Needs Assistance Bed Mobility: Rolling;Sit to Sidelying Rolling: +2 for physical assistance;Total assist       Sit to sidelying: Total assist;+2 for physical assistance General bed mobility comments: Total assist for all bed mobility. Patient not able to reach for railing with RUE.    Transfers Overall transfer level: Needs assistance Equipment used: 2 person hand held assist Transfers: Sit to/from Stand Sit to Stand: Total assist;+2 physical assistance         General transfer comment: Attempted sit to stand with total assist + 2 but patient unable to achieve significant clearance of hips to attempt squat pivot.   Ambulation/Gait                 Stairs            Wheelchair Mobility    Modified Rankin (Stroke Patients Only) Modified Rankin (Stroke Patients Only) Pre-Morbid Rankin Score: Moderately severe disability Modified Rankin: Severe disability     Balance Overall balance assessment: Needs assistance Sitting-balance support: Bilateral upper extremity supported;Feet supported Sitting balance-Leahy Scale: Poor Sitting balance - Comments: Sitting balance activities: sat edge of bed for ~15 minutes progressing from RUE support to hand in lap, to intermittent functional reaching with RUE. Patient able to sit unsupported for <5 seconds at a time.  Postural control: Posterior lean Standing balance support: Single extremity supported;During functional activity Standing balance-Leahy Scale: Zero                              Cognition Arousal/Alertness: Awake/alert Behavior During Therapy: Flat affect Overall Cognitive Status: Within Functional Limits for tasks assessed                                        Exercises Total Joint Exercises Ankle Circles/Pumps: 5 reps;Right Heel Slides: Right;5 reps    General Comments        Pertinent Vitals/Pain Pain Assessment: Faces Faces Pain Scale: Hurts even more Pain Location: left UE  Pain Descriptors / Indicators: Grimacing;Guarding;Discomfort Pain Intervention(s): Limited activity within patient's tolerance;Monitored during  session    Home Living                      Prior Function            PT Goals (current goals can now be found in the care plan section) Acute Rehab PT Goals Patient Stated Goal: to get stronger and go home PT Goal Formulation: With patient Time For Goal Achievement: 04/30/17 Potential to Achieve Goals: Fair Progress towards PT goals: Progressing toward goals    Frequency    Min 3X/week      PT Plan Current plan remains appropriate    Co-evaluation              AM-PAC PT "6  Clicks" Daily Activity  Outcome Measure  Difficulty turning over in bed (including adjusting bedclothes, sheets and blankets)?: Unable Difficulty moving from lying on back to sitting on the side of the bed? : Unable Difficulty sitting down on and standing up from a chair with arms (e.g., wheelchair, bedside commode, etc,.)?: Unable Help needed moving to and from a bed to chair (including a wheelchair)?: Total Help needed walking in hospital room?: Total Help needed climbing 3-5 steps with a railing? : Total 6 Click Score: 6    End of Session Equipment Utilized During Treatment: Gait belt Activity Tolerance: Patient limited by fatigue Patient left: with call bell/phone within reach;in bed;with family/visitor present Nurse Communication: Mobility status;Need for lift equipment PT Visit Diagnosis: Unsteadiness on feet (R26.81);Muscle weakness (generalized) (M62.81)     Time: 1000-1030 PT Time Calculation (min) (ACUTE ONLY): 30 min  Charges:  $Therapeutic Activity: 23-37 mins                    G Codes:       Ellamae Sia, PT, DPT Acute Rehabilitation Services  Pager: [phone]    Willy Eddy 04/20/2017, 12:43 PM

## 2017-04-20 NOTE — Progress Notes (Signed)
Received request for a visit from patient and family. I provided spiritual support and encouragement by leading a prayer. Shared with the family that the Chaplain is available for future visits, if needed.    04/20/17 1400  Clinical Encounter Type  Visited With Patient and family together  Visit Type Spiritual support  Referral From Patient;Family  Consult/Referral To Chaplain  Spiritual Encounters  Spiritual Needs Prayer  Stress Factors  Patient Stress Factors None identified  Family Stress Factors None identified   Redgie Grayer

## 2017-04-20 NOTE — Care Management Note (Signed)
Case Management Note  Patient Details  Name: Austin [name] MRN: [id number] Date of Birth: Jun 09, 1932  Subjective/Objective:                    Action/Plan: Pt with new HD access and just started HD 04/19/2017. Pt continues on IV rocephin. CM following for d/c disposition.  Expected Discharge Date:                  Expected Discharge Plan:     In-House Referral:  Clinical Social Work  Discharge planning Services  CM Consult  Post Acute Care Choice:    Choice offered to:     DME Arranged:    DME Agency:     HH Arranged:    HH Agency:     Status of Service:     If discussed at [employer], dates discussed:    Additional Comments:  Pollie Friar, RN 04/20/2017, 10:44 AM
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
## 2017-04-21 LAB — RENAL FUNCTION PANEL
Albumin: 2.5 g/dL — ABNORMAL LOW (ref 3.5–5.0)
Anion gap: 18 — ABNORMAL HIGH (ref 5–15)
BUN: 96 mg/dL — ABNORMAL HIGH (ref 6–20)
CO2: 21 mmol/L — ABNORMAL LOW (ref 22–32)
Calcium: 8.3 mg/dL — ABNORMAL LOW (ref 8.9–10.3)
Chloride: 103 mmol/L (ref 101–111)
Creatinine, Ser: 10.5 mg/dL — ABNORMAL HIGH (ref 0.61–1.24)
GFR calc Af Amer: 5 mL/min — ABNORMAL LOW (ref >60)
GFR calc non Af Amer: 4 mL/min — ABNORMAL LOW (ref >60)
Glucose, Bld: 89 mg/dL (ref 65–99)
Phosphorus: 8.3 mg/dL — ABNORMAL HIGH (ref 2.5–4.6)
Potassium: 3.9 mmol/L (ref 3.5–5.1)
Sodium: 142 mmol/L (ref 135–145)

## 2017-04-21 LAB — HEPARIN LEVEL (UNFRACTIONATED)
Heparin Unfractionated: 0.71 IU/mL — ABNORMAL HIGH (ref 0.30–0.70)
Heparin Unfractionated: 0.73 IU/mL — ABNORMAL HIGH (ref 0.30–0.70)
Heparin Unfractionated: 0.7 IU/mL (ref 0.30–0.70)

## 2017-04-21 LAB — GLUCOSE, CAPILLARY
Glucose-Capillary: 85 mg/dL (ref 65–99)
Glucose-Capillary: 84 mg/dL (ref 65–99)
Glucose-Capillary: 74 mg/dL (ref 65–99)
Glucose-Capillary: 109 mg/dL — ABNORMAL HIGH (ref 65–99)

## 2017-04-21 LAB — CBC WITH DIFFERENTIAL/PLATELET
Basophils Absolute: 0 10*3/uL (ref 0.0–0.1)
Basophils Relative: 0 %
Eosinophils Absolute: 0.5 10*3/uL (ref 0.0–0.7)
Eosinophils Relative: 4 %
HCT: 31 % — ABNORMAL LOW (ref 39.0–52.0)
Hemoglobin: 9.9 g/dL — ABNORMAL LOW (ref 13.0–17.0)
Lymphocytes Relative: 7 %
Lymphs Abs: 1 10*3/uL (ref 0.7–4.0)
MCH: 30.4 pg (ref 26.0–34.0)
MCHC: 31.9 g/dL (ref 30.0–36.0)
MCV: 95.1 fL (ref 78.0–100.0)
Monocytes Absolute: 1.5 10*3/uL — ABNORMAL HIGH (ref 0.1–1.0)
Monocytes Relative: 11 %
Neutro Abs: 10.6 10*3/uL — ABNORMAL HIGH (ref 1.7–7.7)
Neutrophils Relative %: 78 %
Platelets: 234 10*3/uL (ref 150–400)
RBC: 3.26 MIL/uL — ABNORMAL LOW (ref 4.22–5.81)
RDW: 15.1 % (ref 11.5–15.5)
WBC: 13.5 10*3/uL — ABNORMAL HIGH (ref 4.0–10.5)

## 2017-04-21 NOTE — Progress Notes (Addendum)
Pt left unit for HD  Pt returned to unit 1537
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
## 2017-04-22 LAB — CBC WITH DIFFERENTIAL/PLATELET
Basophils Absolute: 0 10*3/uL (ref 0.0–0.1)
Basophils Relative: 0 %
Eosinophils Absolute: 0.4 10*3/uL (ref 0.0–0.7)
Eosinophils Relative: 3 %
HCT: 29.6 % — ABNORMAL LOW (ref 39.0–52.0)
Hemoglobin: 9.3 g/dL — ABNORMAL LOW (ref 13.0–17.0)
Lymphocytes Relative: 15 %
Lymphs Abs: 1.8 10*3/uL (ref 0.7–4.0)
MCH: 30.2 pg (ref 26.0–34.0)
MCHC: 31.4 g/dL (ref 30.0–36.0)
MCV: 96.1 fL (ref 78.0–100.0)
Monocytes Absolute: 1 10*3/uL (ref 0.1–1.0)
Monocytes Relative: 8 %
Neutro Abs: 8.9 10*3/uL — ABNORMAL HIGH (ref 1.7–7.7)
Neutrophils Relative %: 74 %
Platelets: 245 10*3/uL (ref 150–400)
RBC: 3.08 MIL/uL — ABNORMAL LOW (ref 4.22–5.81)
RDW: 15 % (ref 11.5–15.5)
WBC: 12.1 10*3/uL — ABNORMAL HIGH (ref 4.0–10.5)

## 2017-04-22 LAB — RENAL FUNCTION PANEL
Albumin: 2.4 g/dL — ABNORMAL LOW (ref 3.5–5.0)
Anion gap: 11 (ref 5–15)
BUN: 40 mg/dL — ABNORMAL HIGH (ref 6–20)
CO2: 28 mmol/L (ref 22–32)
Calcium: 8.3 mg/dL — ABNORMAL LOW (ref 8.9–10.3)
Chloride: 100 mmol/L — ABNORMAL LOW (ref 101–111)
Creatinine, Ser: 6.1 mg/dL — ABNORMAL HIGH (ref 0.61–1.24)
GFR calc Af Amer: 9 mL/min — ABNORMAL LOW (ref >60)
GFR calc non Af Amer: 8 mL/min — ABNORMAL LOW (ref >60)
Glucose, Bld: 86 mg/dL (ref 65–99)
Phosphorus: 5.9 mg/dL — ABNORMAL HIGH (ref 2.5–4.6)
Potassium: 3.7 mmol/L (ref 3.5–5.1)
Sodium: 139 mmol/L (ref 135–145)

## 2017-04-22 LAB — GLUCOSE, CAPILLARY
Glucose-Capillary: 77 mg/dL (ref 65–99)
Glucose-Capillary: 79 mg/dL (ref 65–99)
Glucose-Capillary: 82 mg/dL (ref 65–99)
Glucose-Capillary: 83 mg/dL (ref 65–99)
Glucose-Capillary: 87 mg/dL (ref 65–99)
Glucose-Capillary: 92 mg/dL (ref 65–99)

## 2017-04-22 LAB — HEPARIN LEVEL (UNFRACTIONATED): Heparin Unfractionated: 0.32 IU/mL (ref 0.30–0.70)
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
## 2017-04-22 NOTE — Plan of Care (Signed)
  Problem: Education: Goal: Knowledge of disease and its progression will improve Outcome: Progressing  Education on Heparin and other meds.
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
## 2017-04-23 LAB — HEPARIN LEVEL (UNFRACTIONATED)
Heparin Unfractionated: <0.1 IU/mL — ABNORMAL LOW (ref 0.30–0.70)
Heparin Unfractionated: 0.16 IU/mL — ABNORMAL LOW (ref 0.30–0.70)
Heparin Unfractionated: 0.27 IU/mL — ABNORMAL LOW (ref 0.30–0.70)

## 2017-04-23 LAB — CBC WITH DIFFERENTIAL/PLATELET
Basophils Absolute: 0 10*3/uL (ref 0.0–0.1)
Basophils Relative: 0 %
Eosinophils Absolute: 0.2 10*3/uL (ref 0.0–0.7)
Eosinophils Relative: 2 %
HCT: 31.9 % — ABNORMAL LOW (ref 39.0–52.0)
Hemoglobin: 9.9 g/dL — ABNORMAL LOW (ref 13.0–17.0)
Lymphocytes Relative: 12 %
Lymphs Abs: 1.5 10*3/uL (ref 0.7–4.0)
MCH: 30.3 pg (ref 26.0–34.0)
MCHC: 31 g/dL (ref 30.0–36.0)
MCV: 97.6 fL (ref 78.0–100.0)
Monocytes Absolute: 0.9 10*3/uL (ref 0.1–1.0)
Monocytes Relative: 7 %
Neutro Abs: 10.3 10*3/uL — ABNORMAL HIGH (ref 1.7–7.7)
Neutrophils Relative %: 79 %
Platelets: 269 10*3/uL (ref 150–400)
RBC: 3.27 MIL/uL — ABNORMAL LOW (ref 4.22–5.81)
RDW: 15 % (ref 11.5–15.5)
WBC: 12.9 10*3/uL — ABNORMAL HIGH (ref 4.0–10.5)

## 2017-04-23 LAB — RENAL FUNCTION PANEL
Albumin: 2.7 g/dL — ABNORMAL LOW (ref 3.5–5.0)
Anion gap: 12 (ref 5–15)
BUN: 48 mg/dL — ABNORMAL HIGH (ref 6–20)
CO2: 24 mmol/L (ref 22–32)
Calcium: 8.7 mg/dL — ABNORMAL LOW (ref 8.9–10.3)
Chloride: 104 mmol/L (ref 101–111)
Creatinine, Ser: 7.15 mg/dL — ABNORMAL HIGH (ref 0.61–1.24)
GFR calc Af Amer: 7 mL/min — ABNORMAL LOW (ref >60)
GFR calc non Af Amer: 6 mL/min — ABNORMAL LOW (ref >60)
Glucose, Bld: 111 mg/dL — ABNORMAL HIGH (ref 65–99)
Phosphorus: 7.2 mg/dL — ABNORMAL HIGH (ref 2.5–4.6)
Potassium: 3.9 mmol/L (ref 3.5–5.1)
Sodium: 140 mmol/L (ref 135–145)

## 2017-04-23 LAB — GLUCOSE, CAPILLARY: Glucose-Capillary: 76 mg/dL (ref 65–99)

## 2017-04-23 MED ORDER — HYDROCORTISONE ACETATE 25 MG RE SUPP
25.0000 mg | Freq: Two times a day (BID) | RECTAL | Status: AC
  Administered 2017-04-23 – 2017-04-24 (×2): 25 mg via RECTAL
  Filled 2017-04-23 (×2): qty 1

## 2017-04-23 NOTE — Progress Notes (Signed)
ANTICOAGULATION CONSULT NOTE   Pharmacy Consult:  Heparin Indication: atrial fibrillation  No Known Allergies  Patient Measurements: Height: 5\' 6"  (167.6 cm) Weight: 209 lb 7 oz (95 kg) IBW/kg (Calculated) : 63.8 Heparin Dosing Weight: 85 kg   Vital Signs: Temp: 99.1 F (37.3 C) (04/01 2000) Temp Source: Axillary (04/01 2000) BP: 136/65 (04/01 2000) Pulse Rate: 88 (04/01 2000)  Labs: Recent Labs    04/21/17 0539  04/22/17 0500 04/23/17 0437 04/23/17 1406 04/23/17 2323  HGB 9.9*  --  9.3* 9.9*  --   --   HCT 31.0*  --  29.6* 31.9*  --   --   PLT 234  --  245 269  --   --   HEPARINUNFRC 0.73*   < > 0.32 <0.10* 0.16* 0.27*  CREATININE 10.50*  --  6.10* 7.15*  --   --    < > = values in this interval not displayed.    Estimated Creatinine Clearance: 8.6 mL/min (A) (by C-G formula based on SCr of 7.15 mg/dL (H)).   Assessment: 84 y.o. male with h/o Afib, Coumadin on hold, for heparin  Goal of Therapy:  Heparin level 0.3-0.7 units/ml Monitor platelets by anticoagulation protocol: Yes   Plan:  Increase Heparin 1350 units/hr Follow-up am labs.  Phillis Knack, PharmD, BCPS  04/23/2017, 11:58 PM

## 2017-04-23 NOTE — Progress Notes (Signed)
ANTICOAGULATION CONSULT NOTE   Pharmacy Consult:  Heparin Indication: atrial fibrillation  No Known Allergies  Patient Measurements: Height: 5\' 6"  (167.6 cm) Weight: 209 lb 7 oz (95 kg) IBW/kg (Calculated) : 63.8 Heparin Dosing Weight: 85 kg   Vital Signs: Temp: 97.6 F (36.4 C) (04/01 1155) Temp Source: Oral (04/01 1155) BP: 144/71 (04/01 1155) Pulse Rate: 97 (04/01 1155)  Labs: Recent Labs    04/21/17 0539  04/22/17 0500 04/23/17 0437 04/23/17 1406  HGB 9.9*  --  9.3* 9.9*  --   HCT 31.0*  --  29.6* 31.9*  --   PLT 234  --  245 269  --   HEPARINUNFRC 0.73*   < > 0.32 <0.10* 0.16*  CREATININE 10.50*  --  6.10* 7.15*  --    < > = values in this interval not displayed.    Estimated Creatinine Clearance: 8.6 mL/min (A) (by C-G formula based on SCr of 7.15 mg/dL (H)).   Assessment: 26 yom with history of atrial fibrillation on warfarin PTA. Last INR on 3/27 was 1.22 - has been on hold since 3/16.  Pharmacy has been consulted for heparin dosing.  Heparin level is sub-therapeutic but trending up.  No bleeding reported.   Goal of Therapy:  Heparin level 0.3-0.7 units/ml Monitor platelets by anticoagulation protocol: Yes    Plan:  Increase heparin gtt to 1250 units/hr Check 8 hr heparin level Daily heparin level and CBC   Dima Ferrufino D. Mina Marble, PharmD, BCPS Pager:  [phone] 04/23/2017, 2:47 PM

## 2017-04-23 NOTE — Care Management Note (Signed)
Case Management Note  Patient Details  Name: Austin [name] MRN: [id number] Date of Birth: 03-12-1932  Subjective/Objective:                    Action/Plan: Plan is for SNF when medically ready. CM following.  Expected Discharge Date:                  Expected Discharge Plan:  Skilled Nursing Facility  In-House Referral:  Clinical Social Work  Discharge planning Services  CM Consult  Post Acute Care Choice:    Choice offered to:     DME Arranged:    DME Agency:     HH Arranged:    [place] Agency:     Status of Service:  In process, will continue to follow  If discussed at Long Length of Stay Meetings, dates discussed:    Additional Comments:  Pollie Friar, RN 04/23/2017, 11:53 AM
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
## 2017-04-23 NOTE — Progress Notes (Signed)
Physical Therapy Treatment Patient Details Name: Austin [name] MRN: [id number] DOB: February 17, 1932 Today's Date: 04/23/2017    History of Present Illness  84 yo male admitted with N/V x 2 days, fever x 1 day with septic shock and bacteremia in setting of acute cholecystitis, oligruic AKI on CKD, VDRF. Pt with hx of HTN, HLD, CKD, adenocarcinoma of prostate (2020), paralysis from [place] (84).  cholecytostomy tube placed 3/19.  Pt needs dialysis currently and is not a candidate for long term dialysis.     PT Comments    Patient maintaining functional mobility with therapy. Utilized Denna Haggard from bed to chair today with max assist + 2 to power up to achieve adequate hip clearance. Patient achieving some hip extension with use of RUE to help pull up, but unable to achieve full standing position. Continue to recommend SNF based on patient level of deficits to maximize functional independence.     Follow Up Recommendations  Supervision/Assistance - 24 hour;SNF     Equipment Recommendations  None recommended by PT    Recommendations for Other Services       Precautions / Restrictions Precautions Precautions: Fall Restrictions Weight Bearing Restrictions: No    Mobility  Bed Mobility Overal bed mobility: Needs Assistance Bed Mobility: Sidelying to Sit;Rolling Rolling: Max assist Sidelying to sit: Total assist;+2 for physical assistance       General bed mobility comments: Patient able to bend R knee and reach with R arm for railing to initiate rolling to right but needed max assist to achieve complete sidelying. +2 total assist at trunk and BLE's with HOB elevated to progress from sidelying to sitting.   Transfers Overall transfer level: Needs assistance   Transfers: Sit to/from Stand Sit to Stand: +2 physical assistance;Max assist         General transfer comment: Max assist + 2 to power up to achieve adequate hip clearance to utilize [place]. Patient with use of RUE to pull  with LUE supported in lap; pillow placed in front of LLE to prevent anterior slide. Transfer from bed to chair on [place]. Not able to achieve full upright standing position.    Ambulation/Gait                 Stairs            Wheelchair Mobility    Modified Rankin (Stroke Patients Only) Modified Rankin (Stroke Patients Only) Pre-Morbid Rankin Score: Moderately severe disability Modified Rankin: Severe disability     Balance Overall balance assessment: Needs assistance Sitting-balance support: Bilateral upper extremity supported;Feet supported Sitting balance-Leahy Scale: Poor Sitting balance - Comments: Patient able to sit on edge of bed with use of RUE support. No overt LOB.  Postural control: Posterior lean                                  Cognition Arousal/Alertness: Awake/alert Behavior During Therapy: WFL for tasks assessed/performed Overall Cognitive Status: Within Functional Limits for tasks assessed                                        Exercises      General Comments        Pertinent Vitals/Pain Pain Assessment: Faces Faces Pain Scale: Hurts even more Pain Location: left UE  Pain Descriptors / Indicators: Grimacing;Guarding;Discomfort Pain Intervention(s):  Monitored during session;Limited activity within patient's tolerance;Repositioned    Home Living                      Prior Function            PT Goals (current goals can now be found in the care plan section) Acute Rehab PT Goals Patient Stated Goal: to get stronger and go home PT Goal Formulation: With patient Time For Goal Achievement: 04/30/17 Potential to Achieve Goals: Fair Progress towards PT goals: Goals downgraded-see care plan    Frequency    Min 2X/week      PT Plan Current plan remains appropriate    Co-evaluation              AM-PAC PT "6 Clicks" Daily Activity  Outcome Measure  Difficulty turning over in bed  (including adjusting bedclothes, sheets and blankets)?: Unable Difficulty moving from lying on back to sitting on the side of the bed? : Unable Difficulty sitting down on and standing up from a chair with arms (e.g., wheelchair, bedside commode, etc,.)?: Unable Help needed moving to and from a bed to chair (including a wheelchair)?: Total Help needed walking in hospital room?: Total Help needed climbing 3-5 steps with a railing? : Total 6 Click Score: 6    End of Session Equipment Utilized During Treatment: Gait belt Activity Tolerance: Patient limited by fatigue Patient left: with family/visitor present;in chair;with call bell/phone within reach Nurse Communication: Mobility status;Need for lift equipment PT Visit Diagnosis: Unsteadiness on feet (R26.81);Muscle weakness (generalized) (M62.81)     Time: 1005-1036 PT Time Calculation (min) (ACUTE ONLY): 31 min  Charges:  $Therapeutic Activity: 23-37 mins                    G Codes:       Ellamae Sia, PT, DPT Acute Rehabilitation Services  Pager: [phone]    Willy Eddy 04/23/2017, 12:39 PM

## 2017-04-23 NOTE — Progress Notes (Signed)
[name]  DOCUMENTATION CODES:   Obesity unspecified  INTERVENTION:  Continue Nepro Shake po BID, each supplement provides 425 kcal and 19 grams protein  NUTRITION DIAGNOSIS:   Inadequate oral intake related to poor appetite, acute illness as evidenced by meal completion < 50%. -ongoing  GOAL:   Patient will meet greater than or equal to 90% of their needs -progressing  MONITOR:   PO intake, Supplement acceptance  ASSESSMENT:     84 yo male admitted with N/V x 2 days, fever x 1 day with septic shock and bacteremia in setting of acute cholecystitis, oligruic AKI on CKD, VDRF. Pt with hx of HTN, HLD, CKD, adenocarcinoma of prostate (2020), paralysis from [place] (2015)  Last dialyzed 3/29 Cholecystostomy tube placed 3/19  Spoke with patient and wife at bedside. He ate grits and peaches this morning, has been drinking NePro as well. For lunch he really only ate bites. Patient states "I'm just not hungry." Reports good appetite. Denies early satiety. Decreased PO is likely related to age and AKI, will monitor  Labs reviewed:  BUN/Creatinine 48/7.15; Phos 7.2 Medications reviewed and include:  Heparin gtt  Diet Order:  Diet renal with fluid restriction Fluid restriction: Other (see comments); Room service appropriate? Yes; Fluid consistency: Thin  EDUCATION NEEDS:   Not appropriate for education at this time  Skin:  Skin Assessment: Reviewed RN Assessment  Last BM:  04/22/2017  Height:   Ht Readings from Last 1 Encounters:  04/20/17 5\' 6"  (1.676 m)    Weight:   Wt Readings from Last 1 Encounters:  04/23/17 209 lb 7 oz (95 kg)    Ideal Body Weight:  64.5 kg  BMI:  Body mass index is 33.8 kg/m.  Estimated Nutritional Needs:   Kcal:  1800-2000  Protein:  100 gm  Fluid:  1 L + UOP  Jinx Gilden M. Deric Bocock, MS, RD LDN Inpatient Clinical Dietitian Pager [phone]

## 2017-04-23 NOTE — Progress Notes (Signed)
S: Feels better no complaints O:BP (!) 144/71 (BP Location: Left Arm)   Pulse 97   Temp 97.6 F (36.4 C) (Oral)   Resp 20   Ht 5\' 6"  (1.676 m)   Wt 95 kg (209 lb 7 oz)   SpO2 97%   BMI 33.80 kg/m   Intake/Output Summary (Last 24 hours) at 04/23/2017 1239 Last data filed at 04/23/2017 0612 Gross per 24 hour  Intake 162 ml  Output 2050 ml  Net -1888 ml   Intake/Output: I/O last 3 completed shifts: In: 416.8 [P.O.:75; I.V.:341.8] Out: 3205 [Urine:2425; Drains:780]  Intake/Output this shift:  No intake/output data recorded. Weight change: -3.2 kg (-7 lb 0.9 oz) Gen: NAD CVS: no rub Resp: cta Abd: benign Ext: trace - 1 + edema  Recent Labs  Lab 04/17/17 0717 04/18/17 0412 04/19/17 1916 04/20/17 0721 04/21/17 0539 04/22/17 0500 04/23/17 0437  NA 141 141 141 142 142 139 140  K 3.6 3.7 3.9 4.0 3.9 3.7 3.9  CL 97* 97* 99* 103 103 100* 104  CO2 22 21* 20* 22 21* 28 24  GLUCOSE 90 91 110* 87 89 86 111*  BUN 151* 152* 154* 91* 96* 40* 48*  CREATININE 12.83* 13.34* 14.08* 10.10* 10.50* 6.10* 7.15*  ALBUMIN 2.3* 2.3* 2.5* 2.5* 2.5* 2.4* 2.7*  CALCIUM 7.6* 7.6* 7.6* 8.0* 8.3* 8.3* 8.7*  PHOS 7.3* 8.0* 8.8* 7.3* 8.3* 5.9* 7.2*   Liver Function Tests: Recent Labs  Lab 04/21/17 0539 04/22/17 0500 04/23/17 0437  ALBUMIN 2.5* 2.4* 2.7*   No results for input(s): LIPASE, AMYLASE in the last 168 hours. No results for input(s): AMMONIA in the last 168 hours. CBC: Recent Labs  Lab 04/19/17 1923 04/20/17 0721 04/21/17 0539 04/22/17 0500 04/23/17 0437  WBC 15.3* 14.9* 13.5* 12.1* 12.9*  NEUTROABS 12.7* 12.0* 10.6* 8.9* 10.3*  HGB 10.6* 10.5* 9.9* 9.3* 9.9*  HCT 32.6* 32.8* 31.0* 29.6* 31.9*  MCV 92.6 94.5 95.1 96.1 97.6  PLT 232 228 234 245 269   Cardiac Enzymes: No results for input(s): CKTOTAL, CKMB, CKMBINDEX, TROPONINI in the last 168 hours. CBG: Recent Labs  Lab 04/22/17 0739 04/22/17 1135 04/22/17 1625 04/22/17 2051 04/23/17 0003  GLUCAP 77 87 79 82 76     Iron Studies: No results for input(s): IRON, TIBC, TRANSFERRIN, FERRITIN in the last 72 hours. Studies/Results: No results found. . Chlorhexidine Gluconate Cloth  6 each Topical Q0600  . feeding supplement (NEPRO CARB STEADY)  237 mL Oral BID BM  . mouth rinse  15 mL Mouth Rinse BID  . sodium chloride flush  10-40 mL Intracatheter Q12H  . sodium chloride flush  5 mL Intracatheter Q8H    BMET    Component Value Date/Time   NA 140 04/23/2017 0437   K 3.9 04/23/2017 0437   CL 104 04/23/2017 0437   CO2 24 04/23/2017 0437   GLUCOSE 111 (H) 04/23/2017 0437   BUN 48 (H) 04/23/2017 0437   CREATININE 7.15 (H) 04/23/2017 0437   CALCIUM 8.7 (L) 04/23/2017 0437   GFRNONAA 6 (L) 04/23/2017 0437   GFRAA 7 (L) 04/23/2017 0437   CBC    Component Value Date/Time   WBC 12.9 (H) 04/23/2017 0437   RBC 3.27 (L) 04/23/2017 0437   HGB 9.9 (L) 04/23/2017 0437   HCT 31.9 (L) 04/23/2017 0437   PLT 269 04/23/2017 0437   MCV 97.6 04/23/2017 0437   MCH 30.3 04/23/2017 0437   MCHC 31.0 04/23/2017 0437   RDW 15.0 04/23/2017 0437  LYMPHSABS 1.5 04/23/2017 0437   MONOABS 0.9 04/23/2017 0437   EOSABS 0.2 04/23/2017 0437   BASOSABS 0.0 04/23/2017 0437     Assessment/Plan:  1. AKI/CKD stage 3- in setting of ischemic ATN related to acute cholecystitis s/p cholecystostomy tube placement.  SCr baselien ~1.5 peaked at 13, s/p HD on 04/18/17 and 04/20/17.  Good UOP and Scr increased slightly since last HD session but much slower rate of rise.  Continue to hold off on HD and follow UOP and Cr levels.  Hopefully we will start to see Cr improve without HD as pt is not a long term dialysis candidate given advanced age and poor functional status. 2. Acute cholecystitis and septic shock- s/p cholecystostomy tube 04/10/17 and being treated with rocephin.   3. ABLA/anemia of critical illness- transfuse prn. 4. Systolic CHF- stable 5. Left hemiplegia- PT/OT. 6. Disposition- will need SNF placement when stable.    Donetta Potts, MD [employer] [phone]

## 2017-04-24 LAB — RENAL FUNCTION PANEL
Albumin: 2.6 g/dL — ABNORMAL LOW (ref 3.5–5.0)
Anion gap: 17 — ABNORMAL HIGH (ref 5–15)
BUN: 53 mg/dL — ABNORMAL HIGH (ref 6–20)
CO2: 21 mmol/L — ABNORMAL LOW (ref 22–32)
Calcium: 8.4 mg/dL — ABNORMAL LOW (ref 8.9–10.3)
Chloride: 101 mmol/L (ref 101–111)
Creatinine, Ser: 7.09 mg/dL — ABNORMAL HIGH (ref 0.61–1.24)
GFR calc Af Amer: 7 mL/min — ABNORMAL LOW (ref >60)
GFR calc non Af Amer: 6 mL/min — ABNORMAL LOW (ref >60)
Glucose, Bld: 78 mg/dL (ref 65–99)
Phosphorus: 7 mg/dL — ABNORMAL HIGH (ref 2.5–4.6)
Potassium: 4 mmol/L (ref 3.5–5.1)
Sodium: 139 mmol/L (ref 135–145)

## 2017-04-24 LAB — CBC WITH DIFFERENTIAL/PLATELET
Basophils Absolute: 0 10*3/uL (ref 0.0–0.1)
Basophils Relative: 0 %
Eosinophils Absolute: 0.3 10*3/uL (ref 0.0–0.7)
Eosinophils Relative: 3 %
HCT: 29.6 % — ABNORMAL LOW (ref 39.0–52.0)
Hemoglobin: 9.4 g/dL — ABNORMAL LOW (ref 13.0–17.0)
Lymphocytes Relative: 15 %
Lymphs Abs: 1.5 10*3/uL (ref 0.7–4.0)
MCH: 30.8 pg (ref 26.0–34.0)
MCHC: 31.8 g/dL (ref 30.0–36.0)
MCV: 97 fL (ref 78.0–100.0)
Monocytes Absolute: 1.1 10*3/uL — ABNORMAL HIGH (ref 0.1–1.0)
Monocytes Relative: 11 %
Neutro Abs: 7 10*3/uL (ref 1.7–7.7)
Neutrophils Relative %: 71 %
Platelets: 241 10*3/uL (ref 150–400)
RBC: 3.05 MIL/uL — ABNORMAL LOW (ref 4.22–5.81)
RDW: 14.9 % (ref 11.5–15.5)
WBC: 9.9 10*3/uL (ref 4.0–10.5)

## 2017-04-24 LAB — HEPARIN LEVEL (UNFRACTIONATED): Heparin Unfractionated: 0.3 IU/mL (ref 0.30–0.70)

## 2017-04-24 MED ORDER — PANTOPRAZOLE SODIUM 40 MG PO TBEC
40.0000 mg | DELAYED_RELEASE_TABLET | Freq: Every day | ORAL | Status: DC
  Filled 2017-04-24: qty 1

## 2017-04-24 MED ORDER — STERILE WATER FOR INJECTION IV SOLN
INTRAVENOUS | Status: AC
  Administered 2017-04-24: 15:00:00 via INTRAVENOUS
  Filled 2017-04-24: qty 850

## 2017-04-24 MED ORDER — PANTOPRAZOLE SODIUM 40 MG PO TBEC
40.0000 mg | DELAYED_RELEASE_TABLET | Freq: Every day | ORAL | Status: DC
  Administered 2017-04-24 – 2017-04-27 (×4): 40 mg via ORAL
  Filled 2017-04-24 (×3): qty 1

## 2017-04-24 NOTE — Progress Notes (Signed)
ANTICOAGULATION CONSULT NOTE   Pharmacy Consult:  Heparin Indication: atrial fibrillation  No Known Allergies  Patient Measurements: Height: 5\' 6"  (167.6 cm) Weight: 206 lb 5.6 oz (93.6 kg) IBW/kg (Calculated) : 63.8 Heparin Dosing Weight: 85 kg   Vital Signs: Temp: 97.6 F (36.4 C) (04/02 0800) Temp Source: Oral (04/02 0800) BP: 136/70 (04/02 0800) Pulse Rate: 89 (04/02 0800)  Labs: Recent Labs    04/22/17 0500 04/23/17 0437 04/23/17 1406 04/23/17 2323 04/24/17 0343  HGB 9.3* 9.9*  --   --  9.4*  HCT 29.6* 31.9*  --   --  29.6*  PLT 245 269  --   --  241  HEPARINUNFRC 0.32 <0.10* 0.16* 0.27* 0.30  CREATININE 6.10* 7.15*  --   --  7.09*    Estimated Creatinine Clearance: 8.6 mL/min (A) (by C-G formula based on SCr of 7.09 mg/dL (H)).   Assessment: Austin [name] with history of atrial fibrillation on warfarin PTA. Last INR on 3/27 was 1.22 - has been on hold since 3/16.  Pharmacy has been consulted for heparin dosing.  Heparin level is therapeutic and toward the low end of normal.  No bleeding reported.   Goal of Therapy:  Heparin level 0.3-0.7 units/ml Monitor platelets by anticoagulation protocol: Yes    Plan:  Increase heparin gtt to 1400 units/hr  Daily heparin level and CBC   Azriel Jakob D. Mina Marble, PharmD, BCPS Pager:  [phone] 04/24/2017, 9:39 AM

## 2017-04-24 NOTE — Progress Notes (Signed)
S: Feels well, no complaints O:BP 136/70 (BP Location: Right Arm)   Pulse 89   Temp 97.6 F (36.4 C) (Oral)   Resp 18   Ht 5\' 6"  (1.676 m)   Wt 93.6 kg (206 lb 5.6 oz)   SpO2 97%   BMI 33.31 kg/m   Intake/Output Summary (Last 24 hours) at 04/24/2017 1202 Last data filed at 04/24/2017 0900 Gross per 24 hour  Intake 650 ml  Output 2535 ml  Net -1885 ml   Intake/Output: I/O last 3 completed shifts: In: 572 [I.V.:162; Other:10; IV Piggyback:400] Out: 8938 [Urine:3425; Drains:1160]  Intake/Output this shift:  Total I/O In: 240 [P.O.:240] Out: -  Weight change: -1.4 kg (-3 lb 1.4 oz) Gen: NAD CVS: no rub Resp: cta Abd: benign Ext: trace presacral edema  Recent Labs  Lab 04/18/17 0412 04/19/17 1916 04/20/17 0721 04/21/17 0539 04/22/17 0500 04/23/17 0437 04/24/17 0343  NA 141 141 142 142 139 140 139  K 3.7 3.9 4.0 3.9 3.7 3.9 4.0  CL 97* 99* 103 103 100* 104 101  CO2 21* 20* 22 21* 28 24 21*  GLUCOSE 91 110* 87 89 86 111* 78  BUN 152* 154* 91* 96* 40* 48* 53*  CREATININE 13.34* 14.08* 10.10* 10.50* 6.10* 7.15* 7.09*  ALBUMIN 2.3* 2.5* 2.5* 2.5* 2.4* 2.7* 2.6*  CALCIUM 7.6* 7.6* 8.0* 8.3* 8.3* 8.7* 8.4*  PHOS 8.0* 8.8* 7.3* 8.3* 5.9* 7.2* 7.0*   Liver Function Tests: Recent Labs  Lab 04/22/17 0500 04/23/17 0437 04/24/17 0343  ALBUMIN 2.4* 2.7* 2.6*   No results for input(s): LIPASE, AMYLASE in the last 168 hours. No results for input(s): AMMONIA in the last 168 hours. CBC: Recent Labs  Lab 04/20/17 0721 04/21/17 0539 04/22/17 0500 04/23/17 0437 04/24/17 0343  WBC 14.9* 13.5* 12.1* 12.9* 9.9  NEUTROABS 12.0* 10.6* 8.9* 10.3* 7.0  HGB 10.5* 9.9* 9.3* 9.9* 9.4*  HCT 32.8* 31.0* 29.6* 31.9* 29.6*  MCV 94.5 95.1 96.1 97.6 97.0  PLT 228 234 245 269 241   Cardiac Enzymes: No results for input(s): CKTOTAL, CKMB, CKMBINDEX, TROPONINI in the last 168 hours. CBG: Recent Labs  Lab 04/22/17 0739 04/22/17 1135 04/22/17 1625 04/22/17 2051 04/23/17 0003   GLUCAP 77 87 79 82 76    Iron Studies: No results for input(s): IRON, TIBC, TRANSFERRIN, FERRITIN in the last 72 hours. Studies/Results: No results found. . Chlorhexidine Gluconate Cloth  6 each Topical Q0600  . feeding supplement (NEPRO CARB STEADY)  237 mL Oral BID BM  . hydrocortisone  25 mg Rectal BID  . mouth rinse  15 mL Mouth Rinse BID  . sodium chloride flush  10-40 mL Intracatheter Q12H  . sodium chloride flush  5 mL Intracatheter Q8H    BMET    Component Value Date/Time   NA 139 04/24/2017 0343   K 4.0 04/24/2017 0343   CL 101 04/24/2017 0343   CO2 21 (L) 04/24/2017 0343   GLUCOSE 78 04/24/2017 0343   BUN 53 (H) 04/24/2017 0343   CREATININE 7.09 (H) 04/24/2017 0343   CALCIUM 8.4 (L) 04/24/2017 0343   GFRNONAA 6 (L) 04/24/2017 0343   GFRAA 7 (L) 04/24/2017 0343   CBC    Component Value Date/Time   WBC 9.9 04/24/2017 0343   RBC 3.05 (L) 04/24/2017 0343   HGB 9.4 (L) 04/24/2017 0343   HCT 29.6 (L) 04/24/2017 0343   PLT 241 04/24/2017 0343   MCV 97.0 04/24/2017 0343   MCH 30.8 04/24/2017 0343   MCHC  31.8 04/24/2017 0343   RDW 14.9 04/24/2017 0343   LYMPHSABS 1.5 04/24/2017 0343   MONOABS 1.1 (H) 04/24/2017 0343   EOSABS 0.3 04/24/2017 0343   BASOSABS 0.0 04/24/2017 0343     Assessment/Plan:  1. AKI/CKD stage 3- in setting of ischemic ATN related to acute cholecystitis s/p cholecystostomy tube placement.  SCr baselien ~1.5 peaked at 13, s/p HD on 04/18/17 and 04/20/17.  Good UOP and Scr increased slightly since last HD session but much slower rate of rise.  Continue to hold off on HD and follow UOP and Cr levels.  He has started to have improvement of his Cr level without HD and is having vigorous diuretic phase of recovery.  Will start some IVF's due to marked net negative of 2 liters overnight and metabolic acidosis.  Hopefully we will see continued improvement in Cr levels without HD as pt is not a long term dialysis candidate given advanced age and poor  functional status. 2. Acute cholecystitis and septic shock- s/p cholecystostomy tube 04/10/17 and being treated with rocephin.   3. ABLA/anemia of critical illness- transfuse prn. 4. Metabolic acidosis due to #1. Will start isotonic bicarb gtt and follow.  5. Systolic CHF- stable 6. Left hemiplegia- PT/OT. 7. Disposition- will need SNF placement when stable.    Donetta Potts, MD [employer] [phone]
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
## 2017-04-24 NOTE — Care Management Important Message (Signed)
Important Message  Patient Details  Name: Austin [name] MRN: [id number] Date of Birth: Sep 09, 1932   Medicare Important Message Given:  Yes    [name] 04/24/2017, 2:29 PM

## 2017-04-24 NOTE — Progress Notes (Signed)
Occupational Therapy Treatment Patient Details Name: Austin [name] MRN: [id number] DOB: August 03, 1932 Today's Date: 04/24/2017    History of present illness  84 yo male admitted with N/V x 2 days, fever x 1 day with septic shock and bacteremia in setting of acute cholecystitis, oligruic AKI on CKD, VDRF. Pt with hx of HTN, HLD, CKD, adenocarcinoma of prostate (2020), paralysis from [place] (2015).  cholecytostomy tube placed 3/19.  Pt needs dialysis currently and is not a candidate for long term dialysis.    OT comments  Pt progressing towards goals, presents supine in bed pleasant and agreeable to OT tx session. Pt requiring MaxA+2 for bed mobility and sit<>stand at [facility] this session, with use of Stedy to transfer OOB to recliner. Pt requiring total assist (+2) for peri-care after incontinent BM while standing at [facility]. Pt motivated to work with therapy and to return to PLOF. Feel CIR remains appropriate recommendation at this time. Will continue to follow acutely to progress pt towards established OT goals.   Follow Up Recommendations  CIR;Supervision/Assistance - 24 hour    Equipment Recommendations  Other (comment)(TBD in next venue )          Precautions / Restrictions Precautions Precautions: Fall Restrictions Weight Bearing Restrictions: No       Mobility Bed Mobility Overal bed mobility: Needs Assistance Bed Mobility: Supine to Sit     Supine to sit: Max assist;+2 for physical assistance;+2 for safety/equipment     General bed mobility comments: Pt able to advance RLE towards EOB, and reach with R arm for railing to assist with transfer, assist for LLE over EOB and to bring trunk upright into sitting  Transfers Overall transfer level: Needs assistance   Transfers: Sit to/from Stand Sit to Stand: +2 physical assistance;Max assist         General transfer comment: MaxA+2 to boost up into standing with RUE pulling up on Stedy; utilized pillow as added support to  lateral aspect LLE to maintain proper LE alignment as pt's LE tending to roll; pt reports he wears a brace at baseline     Balance Overall balance assessment: Needs assistance Sitting-balance support: Bilateral upper extremity supported;Feet supported Sitting balance-Leahy Scale: Fair Sitting balance - Comments: Patient able to sit on edge of bed with use of RUE support and close MinGuard Postural control: Posterior lean Standing balance support: Single extremity supported;During functional activity Standing balance-Leahy Scale: Zero                             ADL either performed or assessed with clinical judgement   ADL Overall ADL's : Needs assistance/impaired                             Toileting- Clothing Manipulation and Hygiene: Total assistance;+2 for physical assistance;Sit to/from stand         General ADL Comments: pt completing bed mobility and transfer EOB to recliner with use of Stedy and +2 assist; noted to have incontinent BM during transfer, requiring total assist+2 for peri-care while standing at [place]: Awake/alert Behavior During Therapy: [facility] for tasks assessed/performed Overall Cognitive Status: Within Functional Limits for tasks assessed  Pertinent Vitals/ Pain       Pain Assessment: Faces Faces Pain Scale: Hurts even more Pain Location: left UE  Pain Descriptors / Indicators: Grimacing;Guarding;Discomfort Pain Intervention(s): Limited activity within patient's tolerance;Monitored during session;Repositioned                                                          Frequency  Min 3X/week        Progress Toward Goals  OT Goals(current goals can now be found in the care plan section)  Progress towards OT goals: Progressing toward goals  Acute Rehab OT  Goals Patient Stated Goal: to get stronger and go home; return to baseline  OT Goal Formulation: With patient/family Time For Goal Achievement: 05/01/17 Potential to Achieve Goals: Good  Plan Discharge plan remains appropriate                     AM-PAC PT "6 Clicks" Daily Activity     Outcome Measure   Help from another person eating meals?: A Little Help from another person taking care of personal grooming?: A Little Help from another person toileting, which includes using toliet, bedpan, or urinal?: A Lot Help from another person bathing (including washing, rinsing, drying)?: A Lot Help from another person to put on and taking off regular upper body clothing?: A Lot Help from another person to put on and taking off regular lower body clothing?: A Lot 6 Click Score: 14    End of Session Equipment Utilized During Treatment: Gait belt;Other (comment)(Stedy)  OT Visit Diagnosis: Unsteadiness on feet (R26.81);Other abnormalities of gait and mobility (R26.89);Muscle weakness (generalized) (M62.81);Other symptoms and signs involving cognitive function;Hemiplegia and hemiparesis;Pain Hemiplegia - Right/Left: Left Hemiplegia - dominant/non-dominant: Non-Dominant Hemiplegia - caused by: Unspecified(prior CVA) Pain - Right/Left: Left Pain - part of body: Arm   Activity Tolerance Patient tolerated treatment well   Patient Left in chair;with call bell/phone within reach;with family/visitor present;with chair alarm set   Nurse Communication Mobility status;Need for lift equipment        Time: 6659-9357 OT Time Calculation (min): 25 min  Charges: OT General Charges $OT Visit: 1 Visit OT Treatments $Self Care/Home Management : 8-22 mins $Therapeutic Activity: 8-22 mins  Lou Cal, OT Pager [phone] 04/24/2017    [name] 04/24/2017, 4:13 PM

## 2017-04-25 LAB — CBC WITH DIFFERENTIAL/PLATELET
Basophils Absolute: 0 10*3/uL (ref 0.0–0.1)
Basophils Relative: 1 %
Eosinophils Absolute: 0.3 10*3/uL (ref 0.0–0.7)
Eosinophils Relative: 4 %
HCT: 28.7 % — ABNORMAL LOW (ref 39.0–52.0)
Hemoglobin: 9.3 g/dL — ABNORMAL LOW (ref 13.0–17.0)
Lymphocytes Relative: 21 %
Lymphs Abs: 1.8 10*3/uL (ref 0.7–4.0)
MCH: 31.3 pg (ref 26.0–34.0)
MCHC: 32.4 g/dL (ref 30.0–36.0)
MCV: 96.6 fL (ref 78.0–100.0)
Monocytes Absolute: 0.8 10*3/uL (ref 0.1–1.0)
Monocytes Relative: 9 %
Neutro Abs: 5.7 10*3/uL (ref 1.7–7.7)
Neutrophils Relative %: 65 %
Platelets: 256 10*3/uL (ref 150–400)
RBC: 2.97 MIL/uL — ABNORMAL LOW (ref 4.22–5.81)
RDW: 14.9 % (ref 11.5–15.5)
WBC: 8.6 10*3/uL (ref 4.0–10.5)

## 2017-04-25 LAB — RENAL FUNCTION PANEL
Albumin: 2.6 g/dL — ABNORMAL LOW (ref 3.5–5.0)
Anion gap: 15 (ref 5–15)
BUN: 47 mg/dL — ABNORMAL HIGH (ref 6–20)
CO2: 22 mmol/L (ref 22–32)
Calcium: 8.5 mg/dL — ABNORMAL LOW (ref 8.9–10.3)
Chloride: 103 mmol/L (ref 101–111)
Creatinine, Ser: 6.61 mg/dL — ABNORMAL HIGH (ref 0.61–1.24)
GFR calc Af Amer: 8 mL/min — ABNORMAL LOW (ref >60)
GFR calc non Af Amer: 7 mL/min — ABNORMAL LOW (ref >60)
Glucose, Bld: 78 mg/dL (ref 65–99)
Phosphorus: 6.8 mg/dL — ABNORMAL HIGH (ref 2.5–4.6)
Potassium: 3.4 mmol/L — ABNORMAL LOW (ref 3.5–5.1)
Sodium: 140 mmol/L (ref 135–145)

## 2017-04-25 LAB — HEPARIN LEVEL (UNFRACTIONATED): Heparin Unfractionated: 0.41 IU/mL (ref 0.30–0.70)

## 2017-04-25 MED ORDER — WARFARIN SODIUM 7.5 MG PO TABS
7.5000 mg | ORAL_TABLET | Freq: Once | ORAL | Status: AC
  Administered 2017-04-25: 7.5 mg via ORAL
  Filled 2017-04-25: qty 1

## 2017-04-25 MED ORDER — WARFARIN - PHARMACIST DOSING INPATIENT: Freq: Every day | Status: DC

## 2017-04-25 NOTE — Progress Notes (Signed)
S: No complaints O:BP 123/65 (BP Location: Left Arm)   Pulse 99   Temp 98 F (36.7 C) (Oral)   Resp 18   Ht 5\' 6"  (1.676 m)   Wt 91.4 kg (201 lb 8 oz)   SpO2 94%   BMI 32.52 kg/m   Intake/Output Summary (Last 24 hours) at 04/25/2017 1148 Last data filed at 04/25/2017 0619 Gross per 24 hour  Intake 994.27 ml  Output 2135 ml  Net -1140.73 ml   Intake/Output: I/O last 3 completed shifts: In: 1239.3 [P.O.:480; I.V.:754.3; Other:5] Out: 1308 [Urine:2525; Drains:970]  Intake/Output this shift:  No intake/output data recorded. Weight change: -2.2 kg (-4 lb 13.6 oz) Gen: NAD CVS: no rub Resp: scattered rhonchi Abd: +BS, soft, cholecystostomy tube and drain in place Ext: trace edema  Recent Labs  Lab 04/19/17 1916 04/20/17 0721 04/21/17 0539 04/22/17 0500 04/23/17 0437 04/24/17 0343 04/25/17 0249  NA 141 142 142 139 140 139 140  K 3.9 4.0 3.9 3.7 3.9 4.0 3.4*  CL 99* 103 103 100* 104 101 103  CO2 20* 22 21* 28 24 21* 22  GLUCOSE 110* 87 89 86 111* 78 78  BUN 154* 91* 96* 40* 48* 53* 47*  CREATININE 14.08* 10.10* 10.50* 6.10* 7.15* 7.09* 6.61*  ALBUMIN 2.5* 2.5* 2.5* 2.4* 2.7* 2.6* 2.6*  CALCIUM 7.6* 8.0* 8.3* 8.3* 8.7* 8.4* 8.5*  PHOS 8.8* 7.3* 8.3* 5.9* 7.2* 7.0* 6.8*   Liver Function Tests: Recent Labs  Lab 04/23/17 0437 04/24/17 0343 04/25/17 0249  ALBUMIN 2.7* 2.6* 2.6*   No results for input(s): LIPASE, AMYLASE in the last 168 hours. No results for input(s): AMMONIA in the last 168 hours. CBC: Recent Labs  Lab 04/21/17 0539 04/22/17 0500 04/23/17 0437 04/24/17 0343 04/25/17 0249  WBC 13.5* 12.1* 12.9* 9.9 8.6  NEUTROABS 10.6* 8.9* 10.3* 7.0 5.7  HGB 9.9* 9.3* 9.9* 9.4* 9.3*  HCT 31.0* 29.6* 31.9* 29.6* 28.7*  MCV 95.1 96.1 97.6 97.0 96.6  PLT 234 245 269 241 256   Cardiac Enzymes: No results for input(s): CKTOTAL, CKMB, CKMBINDEX, TROPONINI in the last 168 hours. CBG: Recent Labs  Lab 04/22/17 0739 04/22/17 1135 04/22/17 1625 04/22/17 2051  04/23/17 0003  GLUCAP 77 87 79 82 76    Iron Studies: No results for input(s): IRON, TIBC, TRANSFERRIN, FERRITIN in the last 72 hours. Studies/Results: No results found. . feeding supplement (NEPRO CARB STEADY)  237 mL Oral BID BM  . mouth rinse  15 mL Mouth Rinse BID  . pantoprazole  40 mg Oral Daily  . sodium chloride flush  10-40 mL Intracatheter Q12H  . sodium chloride flush  5 mL Intracatheter Q8H    BMET    Component Value Date/Time   NA 140 04/25/2017 0249   K 3.4 (L) 04/25/2017 0249   CL 103 04/25/2017 0249   CO2 22 04/25/2017 0249   GLUCOSE 78 04/25/2017 0249   BUN 47 (H) 04/25/2017 0249   CREATININE 6.61 (H) 04/25/2017 0249   CALCIUM 8.5 (L) 04/25/2017 0249   GFRNONAA 7 (L) 04/25/2017 0249   GFRAA 8 (L) 04/25/2017 0249   CBC    Component Value Date/Time   WBC 8.6 04/25/2017 0249   RBC 2.97 (L) 04/25/2017 0249   HGB 9.3 (L) 04/25/2017 0249   HCT 28.7 (L) 04/25/2017 0249   PLT 256 04/25/2017 0249   MCV 96.6 04/25/2017 0249   MCH 31.3 04/25/2017 0249   MCHC 32.4 04/25/2017 0249   RDW 14.9 04/25/2017 0249  LYMPHSABS 1.8 04/25/2017 0249   MONOABS 0.8 04/25/2017 0249   EOSABS 0.3 04/25/2017 0249   BASOSABS 0.0 04/25/2017 0249    Assessment/Plan:  1. AKI/CKD stage 3- in setting of ischemic ATN related to acute cholecystitis s/p cholecystostomy tube placement. SCr baselien ~1.5 peaked at 13, s/p HD on 04/18/17 and 04/20/17. Good UOP and Scr increased slightly since last HD session but much slower rate of rise.  1. Will not provide any further dialysis given his slow recovery and poor candidacy for long term HD. 2. Will have HD catheter removed to prevent infection 3. Continue to follow his Cr level as he is having vigorous diuretic phase of recovery.   4. May need to restart IVF's if he continues to remain net negative with I's/O's. 2. Acute cholecystitis and septic shock- s/p cholecystostomy tube 04/10/17 and being treated with rocephin.  3. ABLA/anemia of  critical illness- transfuse prn. 4. Metabolic acidosis due to #1. Will start isotonic bicarb gtt and follow.  5. Systolic CHF- stable 6. Left hemiplegia- PT/OT. 7. Atrial fib- ok to start coumadin from our standpoint. 8. Disposition- will need SNF placement when stable.Need Palliative care to help set goals and limits of care given his poor functional status and prognosis (he remains a full code)  Donetta Potts, MD [employer] [phone]

## 2017-04-25 NOTE — Clinical Social Work Note (Signed)
Clinical Social Work Assessment  Patient Details  Name: Austin [name] MRN: [id number] Date of Birth: 11/17/1932  Date of referral:  04/25/17               Reason for consult:  Facility Placement                Permission sought to share information with:  Facility Sport and exercise psychologist, Family Supports Permission granted to share information::  Yes, Verbal Permission Granted  Name::     Haematologist::  SNF  Relationship::  Wife  Contact Information:     Housing/Transportation Living arrangements for the past 2 months:  Single Family Home Source of Information:  Patient, Spouse Patient Interpreter Needed:  None Criminal Activity/Legal Involvement Pertinent to Current Situation/Hospitalization:  No - Comment as needed Significant Relationships:  Spouse Lives with:  Self, Spouse Do you feel safe going back to the place where you live?  Yes Need for family participation in patient care:  No (Coment)  Care giving concerns:  Patient lives at home with wife, and was previously able to get up with min assist and use hemiwalker for mobility. Patient will need short term rehab at discharge to hopefully return to prior level of functioning where he can be at home with min assist from the wife.   Social Worker assessment / plan:  CSW met with patient and patient's wife at bedside to discuss recommendation for SNF. CSW explained that the recommendation changed to SNF from CIR due to patient not being able to tolerate. CSW discussed facility options with patient and wife, and confirmed preference for [facility]. CSW explained referral process and expectations. CSW faxed out referral and left voicemail for Admissions at [facility]. CSW will continue to follow.  Employment status:  Retired Forensic scientist:  Medicare PT Recommendations:  [place] / Referral to community resources:  [place]  Patient/Family's Response to care:  Patient and  wife agreeable to SNF placement.  Patient/Family's Understanding of and Emotional Response to Diagnosis, Current Treatment, and Prognosis:  Patient and wife discussed patient's previous level of functioning, and patient wants to get back to being able to get up easier and walking with his walker when he gets back home. Patient and wife appreciative of CSW assistance.  Emotional Assessment Appearance:  Appears stated age Attitude/Demeanor/Rapport:  Engaged Affect (typically observed):  Pleasant Orientation:  Oriented to Self, Oriented to Place, Oriented to  Time Alcohol / Substance use:  Not Applicable Psych involvement (Current and /or in the community):  No (Comment)  Discharge Needs  Concerns to be addressed:  Care Coordination Readmission within the last 30 days:  No Current discharge risk:  Dependent with Mobility Barriers to Discharge:  Continued Medical Work up   [employer], [place] 04/25/2017, 11:40 AM

## 2017-04-25 NOTE — Progress Notes (Signed)
ANTICOAGULATION CONSULT NOTE   Pharmacy Consult:  Heparin Indication: atrial fibrillation  No Known Allergies  Patient Measurements: Height: 5\' 6"  (167.6 cm) Weight: 201 lb 8 oz (91.4 kg) IBW/kg (Calculated) : 63.8 Heparin Dosing Weight: 85 kg   Vital Signs: Temp: 98 F (36.7 C) (04/03 0840) Temp Source: Oral (04/03 0840) BP: 123/65 (04/03 0840) Pulse Rate: 99 (04/03 0840)  Labs: Recent Labs    04/23/17 0437  04/23/17 2323 04/24/17 0343 04/25/17 0249  HGB 9.9*  --   --  9.4* 9.3*  HCT 31.9*  --   --  29.6* 28.7*  PLT 269  --   --  241 256  HEPARINUNFRC <0.10*   < > 0.27* 0.30 0.41  CREATININE 7.15*  --   --  7.09* 6.61*   < > = values in this interval not displayed.    Estimated Creatinine Clearance: 9.1 mL/min (A) (by C-G formula based on SCr of 6.61 mg/dL (H)).   Assessment: 33 yom with history of atrial fibrillation on warfarin PTA. Last INR on 3/27 was 1.22 - has been on hold since 3/16.  Pharmacy has been consulted for heparin dosing.  Heparin level is therapeutic; no bleeding reported.   Goal of Therapy:  Heparin level 0.3-0.7 units/ml Monitor platelets by anticoagulation protocol: Yes    Plan:  Continue heparin gtt at 1400 units/hr  Daily heparin level and CBC   Dawn Convery D. Mina Marble, PharmD, BCPS Pager:  [phone] 04/25/2017, 9:49 AM
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
## 2017-04-25 NOTE — Progress Notes (Signed)
ANTICOAGULATION CONSULT NOTE   Pharmacy Consult:  Heparin Indication: atrial fibrillation  No Known Allergies  Patient Measurements: Height: 5\' 6"  (167.6 cm) Weight: 201 lb 8 oz (91.4 kg) IBW/kg (Calculated) : 63.8 Heparin Dosing Weight: 85 kg   Vital Signs: Temp: 98.2 F (36.8 C) (04/03 1712) Temp Source: Oral (04/03 1712) BP: 124/65 (04/03 1712) Pulse Rate: 90 (04/03 1712)  Labs: Recent Labs    04/23/17 0437  04/23/17 2323 04/24/17 0343 04/25/17 0249  HGB 9.9*  --   --  9.4* 9.3*  HCT 31.9*  --   --  29.6* 28.7*  PLT 269  --   --  241 256  HEPARINUNFRC <0.10*   < > 0.27* 0.30 0.41  CREATININE 7.15*  --   --  7.09* 6.61*   < > = values in this interval not displayed.    Estimated Creatinine Clearance: 9.1 mL/min (A) (by C-G formula based on SCr of 6.61 mg/dL (H)).   Assessment: 38 yom with history of atrial fibrillation on warfarin PTA. Last INR on 3/27 was 1.22 - has been on hold since 3/16.  Pt has been on therapeutic IV heparin, now to resume coumadin.  *home dose: 7.5mg  daily, last dose 3/16  Goal of Therapy:  INR 2-3 Heparin level 0.3-0.7 units/ml Monitor platelets by anticoagulation protocol: Yes    Plan:  Coumadin 7.5 mg po x 1 tonight Daily PT/INR with AM labs.  Maryanna Shape, PharmD, BCPS  Clinical Pharmacist  Pager: [phone]   04/25/2017, 6:20 PM

## 2017-04-26 LAB — CBC WITH DIFFERENTIAL/PLATELET
Basophils Absolute: 0 10*3/uL (ref 0.0–0.1)
Basophils Relative: 0 %
Eosinophils Absolute: 0.2 10*3/uL (ref 0.0–0.7)
Eosinophils Relative: 3 %
HCT: 29.4 % — ABNORMAL LOW (ref 39.0–52.0)
Hemoglobin: 9.2 g/dL — ABNORMAL LOW (ref 13.0–17.0)
Lymphocytes Relative: 14 %
Lymphs Abs: 1.1 10*3/uL (ref 0.7–4.0)
MCH: 30.1 pg (ref 26.0–34.0)
MCHC: 31.3 g/dL (ref 30.0–36.0)
MCV: 96.1 fL (ref 78.0–100.0)
Monocytes Absolute: 1.3 10*3/uL — ABNORMAL HIGH (ref 0.1–1.0)
Monocytes Relative: 17 %
Neutro Abs: 5 10*3/uL (ref 1.7–7.7)
Neutrophils Relative %: 66 %
Platelets: 248 10*3/uL (ref 150–400)
RBC: 3.06 MIL/uL — ABNORMAL LOW (ref 4.22–5.81)
RDW: 14.5 % (ref 11.5–15.5)
WBC: 7.7 10*3/uL (ref 4.0–10.5)

## 2017-04-26 LAB — RENAL FUNCTION PANEL
Albumin: 2.8 g/dL — ABNORMAL LOW (ref 3.5–5.0)
Anion gap: 14 (ref 5–15)
BUN: 47 mg/dL — ABNORMAL HIGH (ref 6–20)
CO2: 22 mmol/L (ref 22–32)
Calcium: 8.6 mg/dL — ABNORMAL LOW (ref 8.9–10.3)
Chloride: 104 mmol/L (ref 101–111)
Creatinine, Ser: 6.12 mg/dL — ABNORMAL HIGH (ref 0.61–1.24)
GFR calc Af Amer: 9 mL/min — ABNORMAL LOW (ref >60)
GFR calc non Af Amer: 8 mL/min — ABNORMAL LOW (ref >60)
Glucose, Bld: 79 mg/dL (ref 65–99)
Phosphorus: 6.6 mg/dL — ABNORMAL HIGH (ref 2.5–4.6)
Potassium: 3.3 mmol/L — ABNORMAL LOW (ref 3.5–5.1)
Sodium: 140 mmol/L (ref 135–145)

## 2017-04-26 LAB — PROTIME-INR
INR: 1.18
Prothrombin Time: 15 seconds (ref 11.4–15.2)

## 2017-04-26 LAB — HEPARIN LEVEL (UNFRACTIONATED): Heparin Unfractionated: 0.44 IU/mL (ref 0.30–0.70)

## 2017-04-26 MED ORDER — WARFARIN SODIUM 5 MG PO TABS
10.0000 mg | ORAL_TABLET | Freq: Once | ORAL | Status: AC
  Administered 2017-04-26: 10 mg via ORAL
  Filled 2017-04-26: qty 2

## 2017-04-26 NOTE — Progress Notes (Signed)
Physical Therapy Treatment Patient Details Name: Austin [name] MRN: [id number] DOB: [birth year] Today's Date: 04/26/2017    History of Present Illness  84 yo male admitted with N/V x 2 days, fever x 1 day with septic shock and bacteremia in setting of acute cholecystitis, oligruic AKI on CKD, VDRF. Pt with hx of HTN, HLD, CKD, adenocarcinoma of prostate (2020), paralysis from [place] (2015).  cholecytostomy tube placed 3/19.  Pt needs dialysis currently and is not a candidate for long term dialysis.     PT Comments    Session focused on sitting balance, posture, and transferring with Clarise Cruz Plus from bed to chair. Patient able to sit edge of bed for 20 minutes with supervision and RUE support. In sitting, patient provided tactile cueing for midline positioning; patient tends to demonstrate increased R lateral lean with increased fatigue. With Clarise Cruz Plus transfer, left arm protected and supported in internal rotation with a pillow. Bed to chair transfer performed quickly with + 2 due to patient's decreased ability to maintain hip extension. Will continue to follow and progress as tolerated.    Follow Up Recommendations  SNF     Equipment Recommendations  None recommended by PT    Recommendations for Other Services       Precautions / Restrictions Precautions Precautions: Fall Restrictions Weight Bearing Restrictions: No    Mobility  Bed Mobility Overal bed mobility: Needs Assistance Bed Mobility: Rolling;Sidelying to Sit Rolling: Mod assist Sidelying to sit: Max assist;+2 for physical assistance       General bed mobility comments: Rolling with mod assist and sidelying to sit with max assist + 2. VC's for bending R knee and reaching R hand for railing to initiate rolling. Max assist to bring BLE's off of bed and at trunk to achieve upright position.  Transfers Overall transfer level: Needs assistance   Transfers: Sit to/from Stand Sit to Stand: +2 safety/equipment;Total  assist((Sara Plus))         General transfer comment: Clarise Cruz plus to boost up from sit to stand and transfer to recliner. LUE protected/supported with pillow in internal rotation. VC's and tactile cueing for hip extension  Ambulation/Gait                 Stairs            Wheelchair Mobility    Modified Rankin (Stroke Patients Only) Modified Rankin (Stroke Patients Only) Pre-Morbid Rankin Score: Moderately severe disability Modified Rankin: Severe disability     Balance Overall balance assessment: Needs assistance Sitting-balance support: Feet supported;Single extremity supported Sitting balance-Leahy Scale: Poor Sitting balance - Comments: Requires support of R arm for sitting balance. Tactile cueing for anterior tilt and achieving midline. Sat EOB for 20 minutes. Requiring support on right side for right lean in chair. Postural control: Right lateral lean   Standing balance-Leahy Scale: Zero                              Cognition Arousal/Alertness: Awake/alert Behavior During Therapy: WFL for tasks assessed/performed Overall Cognitive Status: Within Functional Limits for tasks assessed                                        Exercises      General Comments        Pertinent Vitals/Pain Pain Assessment: No/denies pain  Home Living                      Prior Function            PT Goals (current goals can now be found in the care plan section) Acute Rehab PT Goals Patient Stated Goal: to get stronger and go home PT Goal Formulation: With patient Time For Goal Achievement: 04/30/17 Potential to Achieve Goals: Fair Progress towards PT goals: Progressing toward goals    Frequency    Min 2X/week      PT Plan Current plan remains appropriate    Co-evaluation              AM-PAC PT "6 Clicks" Daily Activity  Outcome Measure  Difficulty turning over in bed (including adjusting bedclothes, sheets  and blankets)?: Unable Difficulty moving from lying on back to sitting on the side of the bed? : Unable Difficulty sitting down on and standing up from a chair with arms (e.g., wheelchair, bedside commode, etc,.)?: Unable Help needed moving to and from a bed to chair (including a wheelchair)?: Total Help needed walking in hospital room?: Total Help needed climbing 3-5 steps with a railing? : Total 6 Click Score: 6    End of Session Equipment Utilized During Treatment: Gait belt Activity Tolerance: Patient tolerated treatment well Patient left: with call bell/phone within reach;in chair;with chair alarm set Nurse Communication: Mobility status PT Visit Diagnosis: Unsteadiness on feet (R26.81);Muscle weakness (generalized) (M62.81)     Time: 6195-0932 PT Time Calculation (min) (ACUTE ONLY): 46 min  Charges:  $Therapeutic Activity: 38-52 mins                    G Codes:       Ellamae Sia, PT, DPT Acute Rehabilitation Services  Pager: [phone]    Willy Eddy 04/26/2017, 11:06 AM

## 2017-04-26 NOTE — Discharge Instructions (Signed)

## 2017-04-26 NOTE — Progress Notes (Signed)
S:No complaints O:BP (!) 144/67 (BP Location: Right Arm)   Pulse 96   Temp 98.1 F (36.7 C) (Oral)   Resp 20   Ht 5\' 6"  (1.676 m)   Wt 90.4 kg (199 lb 4.7 oz)   SpO2 97%   BMI 32.17 kg/m   Intake/Output Summary (Last 24 hours) at 04/26/2017 1117 Last data filed at 04/26/2017 1100 Gross per 24 hour  Intake 1270.27 ml  Output 2325 ml  Net -1054.73 ml   Intake/Output: I/O last 3 completed shifts: In: 6378 [P.O.:840; I.V.:350] Out: 3410 [Urine:2650; Drains:760]  Intake/Output this shift:  Total I/O In: 464 [P.O.:240; I.V.:224] Out: 300 [Urine:300] Weight change: -1 kg (-2 lb 3.3 oz) Gen: nad CVS: no rub Resp: occ rhonchi Abd: benign Ext: trace edema  Recent Labs  Lab 04/20/17 0721 04/21/17 0539 04/22/17 0500 04/23/17 0437 04/24/17 0343 04/25/17 0249 04/26/17 0442  NA 142 142 139 140 139 140 140  K 4.0 3.9 3.7 3.9 4.0 3.4* 3.3*  CL 103 103 100* 104 101 103 104  CO2 22 21* 28 24 21* 22 22  GLUCOSE 87 89 86 111* 78 78 79  BUN 91* 96* 40* 48* 53* 47* 47*  CREATININE 10.10* 10.50* 6.10* 7.15* 7.09* 6.61* 6.12*  ALBUMIN 2.5* 2.5* 2.4* 2.7* 2.6* 2.6* 2.8*  CALCIUM 8.0* 8.3* 8.3* 8.7* 8.4* 8.5* 8.6*  PHOS 7.3* 8.3* 5.9* 7.2* 7.0* 6.8* 6.6*   Liver Function Tests: Recent Labs  Lab 04/24/17 0343 04/25/17 0249 04/26/17 0442  ALBUMIN 2.6* 2.6* 2.8*   No results for input(s): LIPASE, AMYLASE in the last 168 hours. No results for input(s): AMMONIA in the last 168 hours. CBC: Recent Labs  Lab 04/22/17 0500 04/23/17 0437 04/24/17 0343 04/25/17 0249 04/26/17 0442  WBC 12.1* 12.9* 9.9 8.6 7.7  NEUTROABS 8.9* 10.3* 7.0 5.7 5.0  HGB 9.3* 9.9* 9.4* 9.3* 9.2*  HCT 29.6* 31.9* 29.6* 28.7* 29.4*  MCV 96.1 97.6 97.0 96.6 96.1  PLT 245 269 241 256 248   Cardiac Enzymes: No results for input(s): CKTOTAL, CKMB, CKMBINDEX, TROPONINI in the last 168 hours. CBG: Recent Labs  Lab 04/22/17 0739 04/22/17 1135 04/22/17 1625 04/22/17 2051 04/23/17 0003  GLUCAP 77 87 79 82  76    Iron Studies: No results for input(s): IRON, TIBC, TRANSFERRIN, FERRITIN in the last 72 hours. Studies/Results: No results found. . feeding supplement (NEPRO CARB STEADY)  237 mL Oral BID BM  . mouth rinse  15 mL Mouth Rinse BID  . pantoprazole  40 mg Oral Daily  . sodium chloride flush  10-40 mL Intracatheter Q12H  . sodium chloride flush  5 mL Intracatheter Q8H  . warfarin  10 mg Oral ONCE-1800  . Warfarin - Pharmacist Dosing Inpatient   Does not apply q1800    BMET    Component Value Date/Time   NA 140 04/26/2017 0442   K 3.3 (L) 04/26/2017 0442   CL 104 04/26/2017 0442   CO2 22 04/26/2017 0442   GLUCOSE 79 04/26/2017 0442   BUN 47 (H) 04/26/2017 0442   CREATININE 6.12 (H) 04/26/2017 0442   CALCIUM 8.6 (L) 04/26/2017 0442   GFRNONAA 8 (L) 04/26/2017 0442   GFRAA 9 (L) 04/26/2017 0442   CBC    Component Value Date/Time   WBC 7.7 04/26/2017 0442   RBC 3.06 (L) 04/26/2017 0442   HGB 9.2 (L) 04/26/2017 0442   HCT 29.4 (L) 04/26/2017 0442   PLT 248 04/26/2017 0442   MCV 96.1 04/26/2017 0442  MCH 30.1 04/26/2017 0442   MCHC 31.3 04/26/2017 0442   RDW 14.5 04/26/2017 0442   LYMPHSABS 1.1 04/26/2017 0442   MONOABS 1.3 (H) 04/26/2017 0442   EOSABS 0.2 04/26/2017 0442   BASOSABS 0.0 04/26/2017 0442     Assessment/Plan:  1. AKI/CKD stage 3- in setting of ischemic ATN related to acute cholecystitis s/p cholecystostomy tube placement. SCr baselien ~1.5 peaked at 13, s/p HD on 04/18/17 and 04/20/17. Good UOP and Scr increased slightly since last HD session but much slower rate of rise.  1. Will not provide any further dialysis given his slow recovery and poor candidacy for long term HD. 2. Will have HD catheter removed to prevent infection 3. Continue to follow his Cr level as he is having vigorous diuretic phase of recovery.  4. May need to restart IVF's if he continues to remain net negative with I's/O's. 5. Nothing further to add.  Will sign off.  Please call  with questions or concerns 2. Acute cholecystitis and septic shock- s/p cholecystostomy tube 04/10/17 and being treated with rocephin.  3. ABLA/anemia of critical illness- transfuse prn. 4. Metabolic acidosis due to #1. Will start isotonic bicarb gtt and follow. 5. Systolic CHF- stable 6. Left hemiplegia- PT/OT. 7. Atrial fib- ok to start coumadin from our standpoint. 8. Disposition- will need SNF placement when stable.Need Palliative care to help set goals and limits of care given his poor functional status and prognosis (he remains a full code)   Donetta Potts, MD [employer] [phone]
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
## 2017-04-26 NOTE — Progress Notes (Signed)
ANTICOAGULATION CONSULT NOTE   Pharmacy Consult:  Heparin / Coumadin Indication: atrial fibrillation  No Known Allergies  Patient Measurements: Height: 5\' 6"  (167.6 cm) Weight: 199 lb 4.7 oz (90.4 kg) IBW/kg (Calculated) : 63.8 Heparin Dosing Weight: 85 kg   Vital Signs: Temp: 98.2 F (36.8 C) (04/04 0858) Temp Source: Oral (04/04 0858) BP: 130/74 (04/04 0858) Pulse Rate: 86 (04/04 0858)  Labs: Recent Labs    04/24/17 0343 04/25/17 0249 04/26/17 0442  HGB 9.4* 9.3* 9.2*  HCT 29.6* 28.7* 29.4*  PLT 241 256 248  LABPROT  --   --  15.0  INR  --   --  1.18  HEPARINUNFRC 0.30 0.41 0.44  CREATININE 7.09* 6.61* 6.12*    Estimated Creatinine Clearance: 9.8 mL/min (A) (by C-G formula based on SCr of 6.12 mg/dL (H)).   Assessment: 50 yom with history of atrial fibrillation on Coumadin PTA.  INR was reversed on 04/10/17 for percutaneous tube placement and patient was transitioned to IV heparin.  Coumadin was restarted on 04/25/17.  Heparin level is therapeutic and stable.  INR is sub-therapeutic as expected.  No bleeding reported.  Patient is eating 25-75% of meals.  Home Coumadin dose: 7.5mg  PO daily   Goal of Therapy:  Heparin level 0.3-0.7 units/ml  INR 2 - 3 Monitor platelets by anticoagulation protocol: Yes    Plan:  Continue heparin gtt at 1400 units/hr  Coumadin 10mg  PO today Daily heparin level, PT / INR and CBC   Syesha Thaw D. Mina Marble, PharmD, BCPS Pager:  [phone] 04/26/2017, 10:43 AM

## 2017-04-26 NOTE — Progress Notes (Signed)
Initial Nutrition Assessment  DOCUMENTATION CODES:   Obesity unspecified  INTERVENTION:  Nepro Shake po BID, each supplement provides 425 kcal and 19 grams protein  NUTRITION DIAGNOSIS:   Inadequate oral intake related to poor appetite, acute illness as evidenced by meal completion < 50%. -ongoing  GOAL:   Patient will meet greater than or equal to 90% of their needs -unmet  MONITOR:   PO intake, Supplement acceptance  ASSESSMENT:     84 yo male admitted with N/V x 2 days, fever x 1 day with septic shock and bacteremia in setting of acute cholecystitis, oligruic AKI on CKD, VDRF. Pt with hx of HTN, HLD, CKD, adenocarcinoma of prostate (2020), paralysis from [place] (2015)   cholecystostomy tube 04/10/17  Spoke with patient and wife. He was sliding down in char, pulled him up. Ate [place] bacon, oatmeal, eggs, and cranberry juice this morning. Appetite is ok. He eats best at breakfast wife says. Avg PO 50%. Considered not a long term HD candidate by Nephrology. Discussed with wife Renal Diet and it's implications. Re-iterated to her it is not expected to be long term at this point.  Labs reviewed:  K+ 3.3, Phos 6.6,  Medications reviewed and include: Coumadin, Heparin gtt  Diet Order:  Diet renal with fluid restriction Fluid restriction: Other (see comments); Room service appropriate? Yes; Fluid consistency: Thin  EDUCATION NEEDS:   Not appropriate for education at this time  Skin:  Skin Assessment: Reviewed RN Assessment  Last BM:  04/24/2017  Height:   Ht Readings from Last 1 Encounters:  04/20/17 5\' 6"  (1.676 m)    Weight:   Wt Readings from Last 1 Encounters:  04/26/17 199 lb 4.7 oz (90.4 kg)    Ideal Body Weight:  64.5 kg  BMI:  Body mass index is 32.17 kg/m.  Estimated Nutritional Needs:   Kcal:  1800-2000  Protein:  100 gm  Fluid:  1 L + UOP  Illya Gienger M. Melida Northington, MS, RD LDN Inpatient Clinical Dietitian Pager [phone]

## 2017-04-27 DIAGNOSIS — R5381 Other malaise: Secondary | ICD-10-CM | POA: Diagnosis not present

## 2017-04-27 DIAGNOSIS — I4891 Unspecified atrial fibrillation: Secondary | ICD-10-CM | POA: Diagnosis not present

## 2017-04-27 DIAGNOSIS — R6521 Severe sepsis with septic shock: Secondary | ICD-10-CM | POA: Diagnosis not present

## 2017-04-27 DIAGNOSIS — Z8546 Personal history of malignant neoplasm of prostate: Secondary | ICD-10-CM | POA: Diagnosis not present

## 2017-04-27 DIAGNOSIS — I5042 Chronic combined systolic (congestive) and diastolic (congestive) heart failure: Secondary | ICD-10-CM | POA: Diagnosis present

## 2017-04-27 DIAGNOSIS — Y846 Urinary catheterization as the cause of abnormal reaction of the patient, or of later complication, without mention of misadventure at the time of the procedure: Secondary | ICD-10-CM | POA: Diagnosis present

## 2017-04-27 DIAGNOSIS — K8010 Calculus of gallbladder with chronic cholecystitis without obstruction: Secondary | ICD-10-CM | POA: Diagnosis present

## 2017-04-27 DIAGNOSIS — K219 Gastro-esophageal reflux disease without esophagitis: Secondary | ICD-10-CM | POA: Diagnosis present

## 2017-04-27 DIAGNOSIS — N390 Urinary tract infection, site not specified: Secondary | ICD-10-CM | POA: Diagnosis not present

## 2017-04-27 DIAGNOSIS — K858 Other acute pancreatitis without necrosis or infection: Secondary | ICD-10-CM | POA: Diagnosis not present

## 2017-04-27 DIAGNOSIS — D631 Anemia in chronic kidney disease: Secondary | ICD-10-CM | POA: Diagnosis present

## 2017-04-27 DIAGNOSIS — E785 Hyperlipidemia, unspecified: Secondary | ICD-10-CM | POA: Diagnosis not present

## 2017-04-27 DIAGNOSIS — R410 Disorientation, unspecified: Secondary | ICD-10-CM | POA: Diagnosis not present

## 2017-04-27 DIAGNOSIS — I482 Chronic atrial fibrillation: Secondary | ICD-10-CM | POA: Diagnosis not present

## 2017-04-27 DIAGNOSIS — I130 Hypertensive heart and chronic kidney disease with heart failure and stage 1 through stage 4 chronic kidney disease, or unspecified chronic kidney disease: Secondary | ICD-10-CM | POA: Diagnosis present

## 2017-04-27 DIAGNOSIS — A419 Sepsis, unspecified organism: Secondary | ICD-10-CM | POA: Diagnosis not present

## 2017-04-27 DIAGNOSIS — G8194 Hemiplegia, unspecified affecting left nondominant side: Secondary | ICD-10-CM | POA: Diagnosis present

## 2017-04-27 DIAGNOSIS — I69354 Hemiplegia and hemiparesis following cerebral infarction affecting left non-dominant side: Secondary | ICD-10-CM | POA: Diagnosis not present

## 2017-04-27 DIAGNOSIS — R2681 Unsteadiness on feet: Secondary | ICD-10-CM | POA: Diagnosis not present

## 2017-04-27 DIAGNOSIS — N179 Acute kidney failure, unspecified: Secondary | ICD-10-CM | POA: Diagnosis not present

## 2017-04-27 DIAGNOSIS — N183 Chronic kidney disease, stage 3 (moderate): Secondary | ICD-10-CM | POA: Diagnosis present

## 2017-04-27 DIAGNOSIS — E7800 Pure hypercholesterolemia, unspecified: Secondary | ICD-10-CM | POA: Diagnosis present

## 2017-04-27 DIAGNOSIS — R112 Nausea with vomiting, unspecified: Secondary | ICD-10-CM | POA: Diagnosis not present

## 2017-04-27 DIAGNOSIS — J9602 Acute respiratory failure with hypercapnia: Secondary | ICD-10-CM | POA: Diagnosis not present

## 2017-04-27 DIAGNOSIS — M6281 Muscle weakness (generalized): Secondary | ICD-10-CM | POA: Diagnosis not present

## 2017-04-27 DIAGNOSIS — D649 Anemia, unspecified: Secondary | ICD-10-CM | POA: Diagnosis not present

## 2017-04-27 DIAGNOSIS — R6520 Severe sepsis without septic shock: Secondary | ICD-10-CM | POA: Diagnosis not present

## 2017-04-27 DIAGNOSIS — I480 Paroxysmal atrial fibrillation: Secondary | ICD-10-CM | POA: Diagnosis present

## 2017-04-27 DIAGNOSIS — K810 Acute cholecystitis: Secondary | ICD-10-CM | POA: Diagnosis not present

## 2017-04-27 DIAGNOSIS — Z7901 Long term (current) use of anticoagulants: Secondary | ICD-10-CM | POA: Diagnosis not present

## 2017-04-27 DIAGNOSIS — G9009 Other idiopathic peripheral autonomic neuropathy: Secondary | ICD-10-CM | POA: Diagnosis not present

## 2017-04-27 DIAGNOSIS — A4151 Sepsis due to Escherichia coli [E. coli]: Secondary | ICD-10-CM | POA: Diagnosis not present

## 2017-04-27 DIAGNOSIS — J479 Bronchiectasis, uncomplicated: Secondary | ICD-10-CM | POA: Diagnosis present

## 2017-04-27 DIAGNOSIS — I10 Essential (primary) hypertension: Secondary | ICD-10-CM | POA: Diagnosis not present

## 2017-04-27 DIAGNOSIS — Z86718 Personal history of other venous thrombosis and embolism: Secondary | ICD-10-CM | POA: Diagnosis not present

## 2017-04-27 DIAGNOSIS — R100 Acute abdomen: Secondary | ICD-10-CM | POA: Diagnosis not present

## 2017-04-27 DIAGNOSIS — R7881 Bacteremia: Secondary | ICD-10-CM | POA: Diagnosis not present

## 2017-04-27 DIAGNOSIS — T83511A Infection and inflammatory reaction due to indwelling urethral catheter, initial encounter: Secondary | ICD-10-CM | POA: Diagnosis not present

## 2017-04-27 DIAGNOSIS — R319 Hematuria, unspecified: Secondary | ICD-10-CM | POA: Diagnosis present

## 2017-04-27 DIAGNOSIS — Z87891 Personal history of nicotine dependence: Secondary | ICD-10-CM | POA: Diagnosis not present

## 2017-04-27 DIAGNOSIS — K9186 Retained cholelithiasis following cholecystectomy: Secondary | ICD-10-CM | POA: Diagnosis not present

## 2017-04-27 DIAGNOSIS — R338 Other retention of urine: Secondary | ICD-10-CM | POA: Diagnosis not present

## 2017-04-27 DIAGNOSIS — Z79899 Other long term (current) drug therapy: Secondary | ICD-10-CM | POA: Diagnosis not present

## 2017-04-27 LAB — CBC WITH DIFFERENTIAL/PLATELET
Band Neutrophils: 0 %
Basophils Absolute: 0 10*3/uL (ref 0.0–0.1)
Basophils Relative: 0 %
Blasts: 0 %
Eosinophils Absolute: 0.1 10*3/uL (ref 0.0–0.7)
Eosinophils Relative: 2 %
HCT: 29.1 % — ABNORMAL LOW (ref 39.0–52.0)
Hemoglobin: 9.4 g/dL — ABNORMAL LOW (ref 13.0–17.0)
Lymphocytes Relative: 22 %
Lymphs Abs: 1.5 10*3/uL (ref 0.7–4.0)
MCH: 31.3 pg (ref 26.0–34.0)
MCHC: 32.3 g/dL (ref 30.0–36.0)
MCV: 97 fL (ref 78.0–100.0)
Metamyelocytes Relative: 0 %
Monocytes Absolute: 0.7 10*3/uL (ref 0.1–1.0)
Monocytes Relative: 10 %
Myelocytes: 0 %
Neutro Abs: 4.3 10*3/uL (ref 1.7–7.7)
Neutrophils Relative %: 66 %
Other: 0 %
Platelets: 237 10*3/uL (ref 150–400)
Promyelocytes Absolute: 0 %
RBC: 3 MIL/uL — ABNORMAL LOW (ref 4.22–5.81)
RDW: 14.5 % (ref 11.5–15.5)
WBC: 6.6 10*3/uL (ref 4.0–10.5)
nRBC: 0 /100 WBC

## 2017-04-27 LAB — RENAL FUNCTION PANEL
Albumin: 2.8 g/dL — ABNORMAL LOW (ref 3.5–5.0)
Anion gap: 14 (ref 5–15)
BUN: 45 mg/dL — ABNORMAL HIGH (ref 6–20)
CO2: 22 mmol/L (ref 22–32)
Calcium: 8.7 mg/dL — ABNORMAL LOW (ref 8.9–10.3)
Chloride: 104 mmol/L (ref 101–111)
Creatinine, Ser: 5.37 mg/dL — ABNORMAL HIGH (ref 0.61–1.24)
GFR calc Af Amer: 10 mL/min — ABNORMAL LOW (ref >60)
GFR calc non Af Amer: 9 mL/min — ABNORMAL LOW (ref >60)
Glucose, Bld: 78 mg/dL (ref 65–99)
Phosphorus: 5.5 mg/dL — ABNORMAL HIGH (ref 2.5–4.6)
Potassium: 3.4 mmol/L — ABNORMAL LOW (ref 3.5–5.1)
Sodium: 140 mmol/L (ref 135–145)

## 2017-04-27 LAB — PROTIME-INR
INR: 1.19
Prothrombin Time: 15 seconds (ref 11.4–15.2)

## 2017-04-27 LAB — HEPARIN LEVEL (UNFRACTIONATED): Heparin Unfractionated: 0.32 IU/mL (ref 0.30–0.70)

## 2017-04-27 MED ORDER — NEPRO/CARBSTEADY PO LIQD: 237.0000 mL | Freq: Two times a day (BID) | ORAL | 0 refills | Status: DC

## 2017-04-27 MED ORDER — HYDROCORTISONE ACETATE 25 MG RE SUPP
25.0000 mg | Freq: Two times a day (BID) | RECTAL | Status: DC | PRN
  Filled 2017-04-27 (×2): qty 1

## 2017-04-27 MED ORDER — WARFARIN SODIUM 5 MG PO TABS
10.0000 mg | ORAL_TABLET | Freq: Once | ORAL | Status: AC
  Administered 2017-04-27: 10 mg via ORAL
  Filled 2017-04-27: qty 2

## 2017-04-27 NOTE — Progress Notes (Signed)
Patient  discharged to [facility] health, picked up by [facility]  , Pt's belongings went with him. Condition at discharge is stable.

## 2017-04-27 NOTE — Care Management Note (Signed)
Case Management Note  Patient Details  Name: Austin [name] MRN: [id number] Date of Birth: 1932-05-05  Subjective/Objective:                    Action/Plan: Pt discharging to [facility]. CM signing off.    Expected Discharge Date:  04/27/17               Expected Discharge Plan:  Skilled Nursing Facility  In-House Referral:  Clinical Social Work  Discharge planning Services  CM Consult  Post Acute Care Choice:    Choice offered to:     DME Arranged:    DME Agency:     HH Arranged:    [place] Agency:     Status of Service:  Completed, signed off  If discussed at [employer], dates discussed:    Additional Comments:  Pollie Friar, RN 04/27/2017, 4:41 PM

## 2017-04-27 NOTE — Progress Notes (Signed)
OT Treatment Note  Pt seen for additional visit to fit with L palm guard due to L hand contracture and potential for skin breakdown. Wife present for education. Recommend guard be removed initially q 2 hrs. If pt tolerates without complaints, recommend pt continue to wear palm guard as needed.    04/27/17 1400  OT Visit Information  Last OT Received On 04/27/17  Assistance Needed +2  History of Present Illness  84 yo male admitted with N/V x 2 days, fever x 1 day with septic shock and bacteremia in setting of acute cholecystitis, oligruic AKI on CKD, VDRF. Pt with hx of HTN, HLD, CKD, adenocarcinoma of prostate (2020), paralysis from [place] (2015).  cholecytostomy tube placed 3/19.  Pt needs dialysis currently and is not a candidate for long term dialysis.   Precautions  Precautions Fall  Precaution Comments LUE contracture; hand at risk for skin breakdown  Pain Assessment  Pain Assessment No/denies pain  Cognition  Arousal/Alertness Awake/alert  Behavior During Therapy WFL for tasks assessed/performed  Overall Cognitive Status History of cognitive impairments - at baseline  General Comments per wife  ADL  Overall ADL's  Needs assistance/impaired  General Comments  General comments (skin integrity, edema, etc.) Pt fit with adapted palm guard for L hand. Due to ring finger scissoring over middle contracted finger, guaze place between fingers to "bridge fingers and allow air to get to slightly macerated skin. Wife present for education and verbalized understanding. Recommend staff remove palm guard q 2 hrs initially to ckeck for pressure areas. If pt tolerates without difficulty, recommend pt wear palm guard as needed.   OT - End of Session  Activity Tolerance Patient tolerated treatment well  Patient left in chair;with call bell/phone within reach;with family/visitor present  Nurse Communication Mobility status;Other (comment) (palm guard)  OT Assessment/Plan  OT Plan Discharge plan remains  appropriate;Frequency needs to be updated  OT Visit Diagnosis Unsteadiness on feet (R26.81);Other abnormalities of gait and mobility (R26.89);Muscle weakness (generalized) (M62.81);Other symptoms and signs involving cognitive function;Hemiplegia and hemiparesis;Pain  Hemiplegia - Right/Left Left  Hemiplegia - dominant/non-dominant Non-Dominant  Hemiplegia - caused by Unspecified  Pain - Right/Left Left  Pain - part of body Arm  OT Frequency (ACUTE ONLY) Min 2X/week  Follow Up Recommendations SNF  AM-PAC OT "6 Clicks" Daily Activity Outcome Measure  Help from another person eating meals? 3  Help from another person taking care of personal grooming? 3  Help from another person toileting, which includes using toliet, bedpan, or urinal? 2  Help from another person bathing (including washing, rinsing, drying)? 2  Help from another person to put on and taking off regular upper body clothing? 2  Help from another person to put on and taking off regular lower body clothing? 2  6 Click Score 14  ADL G Code Conversion CK  OT Goal Progression  Progress towards OT goals Progressing toward goals  Acute Rehab OT Goals  Patient Stated Goal to get stronger and go home  OT Goal Formulation With patient  Time For Goal Achievement 05/01/17  Potential to Achieve Goals Good  ADL Goals  Pt Will Perform Eating with set-up;with supervision;sitting  Pt Will Perform Grooming with set-up;with supervision;sitting  Pt Will Transfer to Toilet with min assist;stand pivot transfer;bedside commode;with +2 assist  Additional ADL Goal #1 Pt will perform bed mobility with Min A +2 in preparation for ADLs  Additional ADL Goal #2 Pt will maintain sitting posture with Min A for balance during ADLs  OT Time Calculation  OT Start Time (ACUTE ONLY) 1404  OT Stop Time (ACUTE ONLY) 1416  OT Time Calculation (min) 12 min  [facility], OT/L  [phone] 04/27/2017

## 2017-04-27 NOTE — Progress Notes (Signed)
ANTICOAGULATION CONSULT NOTE   Pharmacy Consult:  Heparin / Coumadin Indication: atrial fibrillation  No Known Allergies  Patient Measurements: Height: 5\' 6"  (167.6 cm) Weight: 195 lb 8.8 oz (88.7 kg) IBW/kg (Calculated) : 63.8 Heparin Dosing Weight: 85 kg   Vital Signs: Temp: 98.1 F (36.7 C) (04/05 0428) Temp Source: Oral (04/05 0428) BP: 141/69 (04/05 0428) Pulse Rate: 84 (04/05 0428)  Labs: Recent Labs    04/25/17 0249 04/26/17 0442 04/27/17 0557  HGB 9.3* 9.2* 9.4*  HCT 28.7* 29.4* 29.1*  PLT 256 248 237  LABPROT  --  15.0 15.0  INR  --  1.18 1.19  HEPARINUNFRC 0.41 0.44 0.32  CREATININE 6.61* 6.12* 5.37*    Estimated Creatinine Clearance: 11.1 mL/min (A) (by C-G formula based on SCr of 5.37 mg/dL (H)).   Assessment: 68 yom with history of atrial fibrillation on Coumadin PTA.  INR was reversed on 04/10/17 for percutaneous tube placement and patient was transitioned to IV heparin.  Coumadin was restarted on 04/25/17.  Heparin level is therapeutic and trended down today.  INR is sub-therapeutic as expected.  No bleeding reported.  Patient is eating 25-75% of meals.  Home Coumadin dose: 7.5mg  PO daily   Goal of Therapy:  Heparin level 0.3-0.7 units/ml  INR 2 - 3 Monitor platelets by anticoagulation protocol: Yes    Plan:  Increase heparin gtt slightly to 1450 units/hr Repeat Coumadin 10mg  PO today Daily PT / INR, heparin level and CBC   Emilee Market D. Mina Marble, PharmD, BCPS Pager:  [phone] 04/27/2017, 11:02 AM

## 2017-04-27 NOTE — Progress Notes (Signed)
Occupational Therapy Treatment Patient Details Name: Austin [name] MRN: [id number] DOB: 1933/01/04 Today's Date: 04/27/2017    History of present illness  84 yo male admitted with N/V x 2 days, fever x 1 day with septic shock and bacteremia in setting of acute cholecystitis, oligruic AKI on CKD, VDRF. Pt with hx of HTN, HLD, CKD, adenocarcinoma of prostate (2020), paralysis from [place] (2015).  cholecytostomy tube placed 3/19.  Pt needs dialysis currently and is not a candidate for long term dialysis.    OT comments  Pt making progress toward goals. Will benefit form use of Palm protector L palm. Continue to recommend rehab at SNF  Follow Up Recommendations  CIR;Supervision/Assistance - 24 hour    Equipment Recommendations  Other (comment)    Recommendations for Other Services Rehab consult;PT consult    Precautions / Restrictions Precautions Precautions: Fall Precaution Comments: LUE contracture; hand at risk for skin breakdown Restrictions Weight Bearing Restrictions: No       Mobility Bed Mobility               General bed mobility comments: sitting EOB with NT  Transfers Overall transfer level: Needs assistance   Transfers: Sit to/from Stand Sit to Stand: +2 physical assistance;Max assist         General transfer comment: Pt able to pull up using R strong UE; L foot stabilized and positioned to prevent rolling on ankle    Balance     Sitting balance-Leahy Scale: Fair       Standing balance-Leahy Scale: Poor                             ADL either performed or assessed with clinical judgement   ADL Overall ADL's : Needs assistance/impaired     Grooming: Set up;Sitting                               Functional mobility during ADLs: Maximal assistance;+2 for physical assistance(with use of stedy)       Vision       Perception     Praxis      Cognition Arousal/Alertness: Awake/alert Behavior During Therapy: WFL for  tasks assessed/performed Overall Cognitive Status: No family/caregiver present to determine baseline cognitive functioning                                          Exercises Exercises: Other exercises Other Exercises Other Exercises: Pt with L hand contracture and staes he has splint at home; discused need to take that to facility to modify as needed; May benefit from use of palm protector   Shoulder Instructions       General Comments      Pertinent Vitals/ Pain       Pain Assessment: No/denies pain  Home Living                                          Prior Functioning/Environment              Frequency  Min 3X/week        Progress Toward Goals  OT Goals(current goals can now be found in the care plan section)  Progress towards OT goals: Progressing toward goals  Acute Rehab OT Goals Patient Stated Goal: to get stronger and go home OT Goal Formulation: With patient Time For Goal Achievement: 05/01/17 Potential to Achieve Goals: Good ADL Goals Pt Will Perform Eating: with set-up;with supervision;sitting Pt Will Perform Grooming: with set-up;with supervision;sitting Pt Will Transfer to Toilet: with min assist;stand pivot transfer;bedside commode;with +2 assist Additional ADL Goal #1: Pt will perform bed mobility with Min A +2 in preparation for ADLs Additional ADL Goal #2: Pt will maintain sitting posture with Min A for balance during ADLs  Plan Discharge plan remains appropriate    Co-evaluation                 AM-PAC PT "6 Clicks" Daily Activity     Outcome Measure   Help from another person eating meals?: A Little Help from another person taking care of personal grooming?: A Little Help from another person toileting, which includes using toliet, bedpan, or urinal?: A Lot Help from another person bathing (including washing, rinsing, drying)?: A Lot Help from another person to put on and taking off regular upper  body clothing?: A Lot Help from another person to put on and taking off regular lower body clothing?: A Lot 6 Click Score: 14    End of Session Equipment Utilized During Treatment: Gait belt;Other (comment)(Stedy)  OT Visit Diagnosis: Unsteadiness on feet (R26.81);Other abnormalities of gait and mobility (R26.89);Muscle weakness (generalized) (M62.81);Other symptoms and signs involving cognitive function;Hemiplegia and hemiparesis;Pain Hemiplegia - Right/Left: Left Hemiplegia - dominant/non-dominant: Non-Dominant Hemiplegia - caused by: Unspecified Pain - Right/Left: Left Pain - part of body: Arm   Activity Tolerance Patient tolerated treatment well   Patient Left in chair;with call bell/phone within reach;with chair alarm set   Nurse Communication Mobility status;Need for lift equipment        Time: 1045-1103 OT Time Calculation (min): 18 min  Charges: OT General Charges $OT Visit: 1 Visit OT Treatments $Self Care/Home Management : 8-22 mins  [place], OT/L  [phone] 04/27/2017   Kaeya Schiffer,HILLARY 04/27/2017, 1:19 PM

## 2017-04-27 NOTE — Progress Notes (Signed)
Report given to nurse at [facility] health care.
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
## 2017-04-27 NOTE — Clinical Social Work Placement (Addendum)
Nurse to call report to [phone], Room 120A    CLINICAL SOCIAL WORK PLACEMENT  NOTE  Date:  04/27/2017  Patient Details  Name: Austin [name] MRN: [id number] Date of Birth: 13-Jan-1933  Clinical Social Work is seeking post-discharge placement for this patient at the [place] level of care (*CSW will initial, date and re-position this form in  chart as items are completed):  Yes   Patient/family provided with [place] Work Department's list of facilities offering this level of care within the geographic area requested by the patient (or if unable, by the patient's family).  Yes   Patient/family informed of their freedom to choose among providers that offer the needed level of care, that participate in Medicare, Medicaid or managed care program needed by the patient, have an available bed and are willing to accept the patient.  Yes   Patient/family informed of 's ownership interest in [facility] and [facility], as well as of the fact that they are under no obligation to receive care at these facilities.  PASRR submitted to EDS on       PASRR number received on       Existing PASRR number confirmed on       FL2 transmitted to all facilities in geographic area requested by pt/family on       FL2 transmitted to all facilities within larger geographic area on       Patient informed that his/her managed care company has contracts with or will negotiate with certain facilities, including the following:        Yes   Patient/family informed of bed offers received.  Patient chooses bed at [facility]     Physician recommends and patient chooses bed at      Patient to be transferred to [facility] on 04/27/17.  Patient to be transferred to facility by [facility]     Patient family notified on 04/27/17 of transfer.  Name of family member notified:  [name]     PHYSICIAN       Additional Comment:     _______________________________________________ Geralynn Ochs, LCSW 04/27/2017, 3:49 PM

## 2017-04-30 ENCOUNTER — Other Ambulatory Visit: Payer: Self-pay | Admitting: General Surgery

## 2017-04-30 DIAGNOSIS — K810 Acute cholecystitis: Secondary | ICD-10-CM

## 2017-05-01 DIAGNOSIS — K810 Acute cholecystitis: Secondary | ICD-10-CM | POA: Diagnosis not present

## 2017-05-01 DIAGNOSIS — R5381 Other malaise: Secondary | ICD-10-CM | POA: Diagnosis not present

## 2017-05-01 DIAGNOSIS — I10 Essential (primary) hypertension: Secondary | ICD-10-CM | POA: Diagnosis not present

## 2017-05-01 DIAGNOSIS — I482 Chronic atrial fibrillation: Secondary | ICD-10-CM | POA: Diagnosis not present

## 2017-05-05 ENCOUNTER — Other Ambulatory Visit: Payer: Self-pay

## 2017-05-05 ENCOUNTER — Inpatient Hospital Stay (HOSPITAL_COMMUNITY)
Admission: EM | Admit: 2017-05-05 | Discharge: 2017-05-11 | DRG: 698 | Disposition: A | Discharge status: Skilled Nursing Facility | Payer: Medicare Other | Attending: Internal Medicine | Admitting: Internal Medicine

## 2017-05-05 DIAGNOSIS — K858 Other acute pancreatitis without necrosis or infection: Secondary | ICD-10-CM | POA: Diagnosis present

## 2017-05-05 DIAGNOSIS — R319 Hematuria, unspecified: Secondary | ICD-10-CM | POA: Diagnosis present

## 2017-05-05 DIAGNOSIS — K8010 Calculus of gallbladder with chronic cholecystitis without obstruction: Secondary | ICD-10-CM | POA: Diagnosis present

## 2017-05-05 DIAGNOSIS — I480 Paroxysmal atrial fibrillation: Secondary | ICD-10-CM | POA: Diagnosis present

## 2017-05-05 DIAGNOSIS — G8194 Hemiplegia, unspecified affecting left nondominant side: Secondary | ICD-10-CM | POA: Diagnosis not present

## 2017-05-05 DIAGNOSIS — Z7901 Long term (current) use of anticoagulants: Secondary | ICD-10-CM

## 2017-05-05 DIAGNOSIS — I5042 Chronic combined systolic (congestive) and diastolic (congestive) heart failure: Secondary | ICD-10-CM | POA: Diagnosis present

## 2017-05-05 DIAGNOSIS — Y846 Urinary catheterization as the cause of abnormal reaction of the patient, or of later complication, without mention of misadventure at the time of the procedure: Secondary | ICD-10-CM | POA: Diagnosis present

## 2017-05-05 DIAGNOSIS — A419 Sepsis, unspecified organism: Secondary | ICD-10-CM | POA: Diagnosis not present

## 2017-05-05 DIAGNOSIS — I130 Hypertensive heart and chronic kidney disease with heart failure and stage 1 through stage 4 chronic kidney disease, or unspecified chronic kidney disease: Secondary | ICD-10-CM | POA: Diagnosis present

## 2017-05-05 DIAGNOSIS — N390 Urinary tract infection, site not specified: Secondary | ICD-10-CM

## 2017-05-05 DIAGNOSIS — J479 Bronchiectasis, uncomplicated: Secondary | ICD-10-CM | POA: Diagnosis present

## 2017-05-05 DIAGNOSIS — R112 Nausea with vomiting, unspecified: Secondary | ICD-10-CM | POA: Diagnosis not present

## 2017-05-05 DIAGNOSIS — K449 Diaphragmatic hernia without obstruction or gangrene: Secondary | ICD-10-CM | POA: Diagnosis not present

## 2017-05-05 DIAGNOSIS — T83511A Infection and inflammatory reaction due to indwelling urethral catheter, initial encounter: Principal | ICD-10-CM | POA: Diagnosis present

## 2017-05-05 DIAGNOSIS — Z79899 Other long term (current) drug therapy: Secondary | ICD-10-CM

## 2017-05-05 DIAGNOSIS — N183 Chronic kidney disease, stage 3 (moderate): Secondary | ICD-10-CM | POA: Diagnosis present

## 2017-05-05 DIAGNOSIS — Z87891 Personal history of nicotine dependence: Secondary | ICD-10-CM

## 2017-05-05 DIAGNOSIS — R000 Tachycardia, unspecified: Secondary | ICD-10-CM | POA: Diagnosis not present

## 2017-05-05 DIAGNOSIS — D631 Anemia in chronic kidney disease: Secondary | ICD-10-CM | POA: Diagnosis present

## 2017-05-05 DIAGNOSIS — K859 Acute pancreatitis without necrosis or infection, unspecified: Secondary | ICD-10-CM

## 2017-05-05 DIAGNOSIS — E7800 Pure hypercholesterolemia, unspecified: Secondary | ICD-10-CM | POA: Diagnosis present

## 2017-05-05 DIAGNOSIS — K219 Gastro-esophageal reflux disease without esophagitis: Secondary | ICD-10-CM | POA: Diagnosis present

## 2017-05-05 DIAGNOSIS — Z86718 Personal history of other venous thrombosis and embolism: Secondary | ICD-10-CM

## 2017-05-05 DIAGNOSIS — Z8546 Personal history of malignant neoplasm of prostate: Secondary | ICD-10-CM

## 2017-05-05 NOTE — ED Triage Notes (Signed)
Per EMS pt from [facility].  Has history of gallbladder issues, vomiting and hiccoughs beginning tonight.  Refused all treatment options at the facility because he wants to be evaluated at the hospital.  [name] non ambulatory.  A & O

## 2017-05-06 ENCOUNTER — Encounter (HOSPITAL_COMMUNITY): Payer: Self-pay | Admitting: Internal Medicine

## 2017-05-06 ENCOUNTER — Emergency Department (HOSPITAL_COMMUNITY): Payer: Medicare Other

## 2017-05-06 DIAGNOSIS — R410 Disorientation, unspecified: Secondary | ICD-10-CM | POA: Diagnosis not present

## 2017-05-06 DIAGNOSIS — E785 Hyperlipidemia, unspecified: Secondary | ICD-10-CM | POA: Diagnosis not present

## 2017-05-06 DIAGNOSIS — R000 Tachycardia, unspecified: Secondary | ICD-10-CM | POA: Diagnosis not present

## 2017-05-06 DIAGNOSIS — K449 Diaphragmatic hernia without obstruction or gangrene: Secondary | ICD-10-CM | POA: Diagnosis not present

## 2017-05-06 DIAGNOSIS — R338 Other retention of urine: Secondary | ICD-10-CM | POA: Diagnosis not present

## 2017-05-06 DIAGNOSIS — N390 Urinary tract infection, site not specified: Secondary | ICD-10-CM

## 2017-05-06 DIAGNOSIS — A419 Sepsis, unspecified organism: Secondary | ICD-10-CM | POA: Diagnosis present

## 2017-05-06 DIAGNOSIS — I130 Hypertensive heart and chronic kidney disease with heart failure and stage 1 through stage 4 chronic kidney disease, or unspecified chronic kidney disease: Secondary | ICD-10-CM | POA: Diagnosis present

## 2017-05-06 DIAGNOSIS — Z87891 Personal history of nicotine dependence: Secondary | ICD-10-CM | POA: Diagnosis not present

## 2017-05-06 DIAGNOSIS — M6281 Muscle weakness (generalized): Secondary | ICD-10-CM | POA: Diagnosis not present

## 2017-05-06 DIAGNOSIS — K219 Gastro-esophageal reflux disease without esophagitis: Secondary | ICD-10-CM | POA: Diagnosis present

## 2017-05-06 DIAGNOSIS — I480 Paroxysmal atrial fibrillation: Secondary | ICD-10-CM | POA: Diagnosis not present

## 2017-05-06 DIAGNOSIS — J9602 Acute respiratory failure with hypercapnia: Secondary | ICD-10-CM | POA: Diagnosis not present

## 2017-05-06 DIAGNOSIS — R319 Hematuria, unspecified: Secondary | ICD-10-CM | POA: Diagnosis present

## 2017-05-06 DIAGNOSIS — T83511A Infection and inflammatory reaction due to indwelling urethral catheter, initial encounter: Secondary | ICD-10-CM | POA: Diagnosis not present

## 2017-05-06 DIAGNOSIS — R4182 Altered mental status, unspecified: Secondary | ICD-10-CM | POA: Diagnosis not present

## 2017-05-06 DIAGNOSIS — Z79899 Other long term (current) drug therapy: Secondary | ICD-10-CM | POA: Diagnosis not present

## 2017-05-06 DIAGNOSIS — E7800 Pure hypercholesterolemia, unspecified: Secondary | ICD-10-CM | POA: Diagnosis present

## 2017-05-06 DIAGNOSIS — I5042 Chronic combined systolic (congestive) and diastolic (congestive) heart failure: Secondary | ICD-10-CM | POA: Diagnosis present

## 2017-05-06 DIAGNOSIS — I69354 Hemiplegia and hemiparesis following cerebral infarction affecting left non-dominant side: Secondary | ICD-10-CM | POA: Diagnosis not present

## 2017-05-06 DIAGNOSIS — R7881 Bacteremia: Secondary | ICD-10-CM | POA: Diagnosis not present

## 2017-05-06 DIAGNOSIS — D631 Anemia in chronic kidney disease: Secondary | ICD-10-CM | POA: Diagnosis present

## 2017-05-06 DIAGNOSIS — R112 Nausea with vomiting, unspecified: Secondary | ICD-10-CM | POA: Insufficient documentation

## 2017-05-06 DIAGNOSIS — K858 Other acute pancreatitis without necrosis or infection: Secondary | ICD-10-CM | POA: Diagnosis not present

## 2017-05-06 DIAGNOSIS — N183 Chronic kidney disease, stage 3 (moderate): Secondary | ICD-10-CM | POA: Diagnosis not present

## 2017-05-06 DIAGNOSIS — K9186 Retained cholelithiasis following cholecystectomy: Secondary | ICD-10-CM | POA: Diagnosis not present

## 2017-05-06 DIAGNOSIS — G8194 Hemiplegia, unspecified affecting left nondominant side: Secondary | ICD-10-CM | POA: Diagnosis present

## 2017-05-06 DIAGNOSIS — Z7901 Long term (current) use of anticoagulants: Secondary | ICD-10-CM | POA: Diagnosis not present

## 2017-05-06 DIAGNOSIS — I4891 Unspecified atrial fibrillation: Secondary | ICD-10-CM | POA: Diagnosis not present

## 2017-05-06 DIAGNOSIS — J479 Bronchiectasis, uncomplicated: Secondary | ICD-10-CM | POA: Diagnosis present

## 2017-05-06 DIAGNOSIS — R2681 Unsteadiness on feet: Secondary | ICD-10-CM | POA: Diagnosis not present

## 2017-05-06 DIAGNOSIS — A4151 Sepsis due to Escherichia coli [E. coli]: Secondary | ICD-10-CM | POA: Diagnosis not present

## 2017-05-06 DIAGNOSIS — G9009 Other idiopathic peripheral autonomic neuropathy: Secondary | ICD-10-CM | POA: Diagnosis not present

## 2017-05-06 DIAGNOSIS — D649 Anemia, unspecified: Secondary | ICD-10-CM | POA: Diagnosis not present

## 2017-05-06 DIAGNOSIS — K810 Acute cholecystitis: Secondary | ICD-10-CM | POA: Diagnosis not present

## 2017-05-06 DIAGNOSIS — Y846 Urinary catheterization as the cause of abnormal reaction of the patient, or of later complication, without mention of misadventure at the time of the procedure: Secondary | ICD-10-CM | POA: Diagnosis present

## 2017-05-06 DIAGNOSIS — Z8546 Personal history of malignant neoplasm of prostate: Secondary | ICD-10-CM | POA: Diagnosis not present

## 2017-05-06 DIAGNOSIS — K8010 Calculus of gallbladder with chronic cholecystitis without obstruction: Secondary | ICD-10-CM | POA: Diagnosis present

## 2017-05-06 DIAGNOSIS — R6520 Severe sepsis without septic shock: Secondary | ICD-10-CM | POA: Diagnosis not present

## 2017-05-06 DIAGNOSIS — Z86718 Personal history of other venous thrombosis and embolism: Secondary | ICD-10-CM | POA: Diagnosis not present

## 2017-05-06 LAB — CBC
HCT: 40.8 % (ref 39.0–52.0)
Hemoglobin: 13.7 g/dL (ref 13.0–17.0)
MCH: 31.4 pg (ref 26.0–34.0)
MCHC: 33.6 g/dL (ref 30.0–36.0)
MCV: 93.4 fL (ref 78.0–100.0)
Platelets: 225 10*3/uL (ref 150–400)
RBC: 4.37 MIL/uL (ref 4.22–5.81)
RDW: 14.2 % (ref 11.5–15.5)
WBC: 14.9 10*3/uL — ABNORMAL HIGH (ref 4.0–10.5)

## 2017-05-06 LAB — URINALYSIS, ROUTINE W REFLEX MICROSCOPIC
Bilirubin Urine: NEGATIVE
Glucose, UA: NEGATIVE mg/dL
Ketones, ur: NEGATIVE mg/dL
Nitrite: NEGATIVE
Protein, ur: 30 mg/dL — AB
Specific Gravity, Urine: 1.015 (ref 1.005–1.030)
pH: 5 (ref 5.0–8.0)

## 2017-05-06 LAB — COMPREHENSIVE METABOLIC PANEL
ALT: 46 U/L (ref 17–63)
AST: 34 U/L (ref 15–41)
Albumin: 3.4 g/dL — ABNORMAL LOW (ref 3.5–5.0)
Alkaline Phosphatase: 107 U/L (ref 38–126)
Anion gap: 14 (ref 5–15)
BUN: 64 mg/dL — ABNORMAL HIGH (ref 6–20)
CO2: 17 mmol/L — ABNORMAL LOW (ref 22–32)
Calcium: 9.4 mg/dL (ref 8.9–10.3)
Chloride: 105 mmol/L (ref 101–111)
Creatinine, Ser: 3.71 mg/dL — ABNORMAL HIGH (ref 0.61–1.24)
GFR calc Af Amer: 16 mL/min — ABNORMAL LOW (ref >60)
GFR calc non Af Amer: 14 mL/min — ABNORMAL LOW (ref >60)
Glucose, Bld: 127 mg/dL — ABNORMAL HIGH (ref 65–99)
Potassium: 4.6 mmol/L (ref 3.5–5.1)
Sodium: 136 mmol/L (ref 135–145)
Total Bilirubin: 0.7 mg/dL (ref 0.3–1.2)
Total Protein: 8 g/dL (ref 6.5–8.1)

## 2017-05-06 LAB — I-STAT CG4 LACTIC ACID, ED: Lactic Acid, Venous: 1.11 mmol/L (ref 0.5–1.9)

## 2017-05-06 LAB — LIPASE, BLOOD: Lipase: 52 U/L — ABNORMAL HIGH (ref 11–51)

## 2017-05-06 LAB — PROTIME-INR
INR: 1.64
Prothrombin Time: 19.2 seconds — ABNORMAL HIGH (ref 11.4–15.2)

## 2017-05-06 MED ORDER — ONDANSETRON HCL 4 MG/2ML IJ SOLN
4.0000 mg | Freq: Once | INTRAMUSCULAR | Status: AC
  Administered 2017-05-06: 4 mg via INTRAVENOUS
  Filled 2017-05-06: qty 2

## 2017-05-06 MED ORDER — PIPERACILLIN-TAZOBACTAM 3.375 G IVPB 30 MIN
3.3750 g | Freq: Once | INTRAVENOUS | Status: AC
  Administered 2017-05-06: 3.375 g via INTRAVENOUS
  Filled 2017-05-06: qty 50

## 2017-05-06 MED ORDER — VANCOMYCIN HCL 10 G IV SOLR
1500.0000 mg | Freq: Once | INTRAVENOUS | Status: AC
Start: 2017-05-06 — End: 2017-05-06
  Administered 2017-05-06: 1500 mg via INTRAVENOUS
  Filled 2017-05-06: qty 1500

## 2017-05-06 MED ORDER — PIPERACILLIN-TAZOBACTAM 3.375 G IVPB
3.3750 g | Freq: Three times a day (TID) | INTRAVENOUS | Status: DC
Start: 2017-05-06 — End: 2017-05-06
  Filled 2017-05-06: qty 50

## 2017-05-06 MED ORDER — WARFARIN SODIUM 7.5 MG PO TABS
7.5000 mg | ORAL_TABLET | Freq: Once | ORAL | Status: AC
  Administered 2017-05-06: 7.5 mg via ORAL
  Filled 2017-05-06: qty 1

## 2017-05-06 MED ORDER — SODIUM CHLORIDE 0.9 % IV BOLUS
500.0000 mL | Freq: Once | INTRAVENOUS | Status: AC
  Administered 2017-05-06: 500 mL via INTRAVENOUS

## 2017-05-06 MED ORDER — VANCOMYCIN HCL IN DEXTROSE 1-5 GM/200ML-% IV SOLN
1000.0000 mg | INTRAVENOUS | Status: DC
  Filled 2017-05-06: qty 200

## 2017-05-06 MED ORDER — WARFARIN - PHARMACIST DOSING INPATIENT
Freq: Every day | Status: DC
  Administered 2017-05-06 – 2017-05-10 (×5)

## 2017-05-06 MED ORDER — PIPERACILLIN-TAZOBACTAM IN DEX 2-0.25 GM/50ML IV SOLN
2.2500 g | Freq: Three times a day (TID) | INTRAVENOUS | Status: DC
  Administered 2017-05-06 – 2017-05-11 (×16): 2.25 g via INTRAVENOUS
  Filled 2017-05-06 (×19): qty 50

## 2017-05-06 NOTE — Progress Notes (Signed)
Pharmacy Note - Antibiotics and Anticoagulation  Austin [name] is a 84 y.o. male admitted on 05/05/2017 with sepsis.  Pharmacy has been consulted for vancomycin and zosyn dosing. Patient takes warfarin PTA for history of Afib, pharmacy consulted to dose warfarin as well.  Patient admitted with N/V and abdominal pain x 3 days. UA concerning for UTI with leukocytes, many bacteria. Patient is afebrile, WBC 14.9, LA 1.11. Also with renal dysfunction at baseline CKD3, Scr is 3.71, CrCl ~16. Patient has received 1 dose of Zosyn ~0600.   Warfarin home dose (per last clinic visit 04/02/17): 7.5 mg daily except 5 mg on Sun/Wed. INR low on admission 1.64. CBC is stable, some hematuria noted on UA.   Plan: Zosyn 2.25 gram IV q8 hours Vancomycin 1500 mg IV x 1, then 1000 mg IV q48h. Goal trough 15-20. -Monitor renal fxn, culture results, clinical status, LOT/de-escalation plans, trough as indicated Warfarin 7.5 mg PO x 1 tonight Monitor daily INR, CBC, s/sx of bleeding  Height: 5\' 6"  (167.6 cm) Weight: 177 lb 0.5 oz (80.3 kg) IBW/kg (Calculated) : 63.8  Temp (24hrs), Avg:98.8 F (37.1 C), Min:98.2 F (36.8 C), Max:99.3 F (37.4 C)  Recent Labs  Lab 05/06/17 0150 05/06/17 0417  WBC 14.9*  --   CREATININE 3.71*  --   LATICACIDVEN  --  1.11    Estimated Creatinine Clearance: 15.3 mL/min (A) (by C-G formula based on SCr of 3.71 mg/dL (H)).    No Known Allergies  Antimicrobials this admission: Vanc 4/14>> Zosyn 4/14>> (first dose 0559)  Dose adjustments this admission: n/a  Microbiology results: 4/14 Blood: sent 4/14 urine: sent  Thank you for allowing pharmacy to be a part of this patient's care.  Charlene Brooke, PharmD PGY1 Pharmacy Resident Phone: [phone] After 3:30PM please call [place] [phone] 05/06/2017 8:26 AM

## 2017-05-06 NOTE — ED Notes (Signed)
Patient transported to CT 
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
## 2017-05-06 NOTE — ED Notes (Signed)
Pt currently in CT.

## 2017-05-06 NOTE — Progress Notes (Signed)
Received report from Kari,RN in the ED. 
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
## 2017-05-07 DIAGNOSIS — K858 Other acute pancreatitis without necrosis or infection: Secondary | ICD-10-CM

## 2017-05-07 LAB — COMPREHENSIVE METABOLIC PANEL
ALT: 50 U/L (ref 17–63)
AST: 34 U/L (ref 15–41)
Albumin: 3 g/dL — ABNORMAL LOW (ref 3.5–5.0)
Alkaline Phosphatase: 100 U/L (ref 38–126)
Anion gap: 12 (ref 5–15)
BUN: 57 mg/dL — ABNORMAL HIGH (ref 6–20)
CO2: 18 mmol/L — ABNORMAL LOW (ref 22–32)
Calcium: 8.9 mg/dL (ref 8.9–10.3)
Chloride: 106 mmol/L (ref 101–111)
Creatinine, Ser: 3.66 mg/dL — ABNORMAL HIGH (ref 0.61–1.24)
GFR calc Af Amer: 16 mL/min — ABNORMAL LOW (ref >60)
GFR calc non Af Amer: 14 mL/min — ABNORMAL LOW (ref >60)
Glucose, Bld: 93 mg/dL (ref 65–99)
Potassium: 4.5 mmol/L (ref 3.5–5.1)
Sodium: 136 mmol/L (ref 135–145)
Total Bilirubin: 0.9 mg/dL (ref 0.3–1.2)
Total Protein: 7.3 g/dL (ref 6.5–8.1)

## 2017-05-07 LAB — TROPONIN I
Troponin I: <0.03 ng/mL (ref <0.03)
Troponin I: <0.03 ng/mL (ref <0.03)
Troponin I: <0.03 ng/mL (ref <0.03)

## 2017-05-07 LAB — CBC
HCT: 32.3 % — ABNORMAL LOW (ref 39.0–52.0)
Hemoglobin: 10.5 g/dL — ABNORMAL LOW (ref 13.0–17.0)
MCH: 31 pg (ref 26.0–34.0)
MCHC: 32.5 g/dL (ref 30.0–36.0)
MCV: 95.3 fL (ref 78.0–100.0)
Platelets: 258 10*3/uL (ref 150–400)
RBC: 3.39 MIL/uL — ABNORMAL LOW (ref 4.22–5.81)
RDW: 14.8 % (ref 11.5–15.5)
WBC: 11.6 10*3/uL — ABNORMAL HIGH (ref 4.0–10.5)

## 2017-05-07 LAB — PROTIME-INR
INR: 2.22
Prothrombin Time: 24.5 seconds — ABNORMAL HIGH (ref 11.4–15.2)

## 2017-05-07 LAB — LIPASE, BLOOD: Lipase: 47 U/L (ref 11–51)

## 2017-05-07 LAB — CK TOTAL AND CKMB (NOT AT ARMC)
CK, MB: 0.9 ng/mL (ref 0.5–5.0)
Relative Index: INVALID (ref 0.0–2.5)
Total CK: 52 U/L (ref 49–397)

## 2017-05-07 MED ORDER — VANCOMYCIN HCL IN DEXTROSE 1-5 GM/200ML-% IV SOLN
1000.0000 mg | INTRAVENOUS | Status: DC
Start: 2017-05-08 — End: 2017-05-10
  Administered 2017-05-08 – 2017-05-10 (×2): 1000 mg via INTRAVENOUS
  Filled 2017-05-07 (×2): qty 200

## 2017-05-07 MED ORDER — ACETAMINOPHEN 650 MG RE SUPP: 650.0000 mg | Freq: Four times a day (QID) | RECTAL | Status: DC | PRN

## 2017-05-07 MED ORDER — ONDANSETRON HCL 4 MG/2ML IJ SOLN: 4.0000 mg | Freq: Four times a day (QID) | INTRAMUSCULAR | Status: DC | PRN

## 2017-05-07 MED ORDER — PANTOPRAZOLE SODIUM 40 MG PO TBEC
40.0000 mg | DELAYED_RELEASE_TABLET | Freq: Every day | ORAL | Status: DC
  Administered 2017-05-07 – 2017-05-11 (×5): 40 mg via ORAL
  Filled 2017-05-07 (×5): qty 1

## 2017-05-07 MED ORDER — ACETAMINOPHEN 325 MG PO TABS: 650.0000 mg | ORAL_TABLET | Freq: Four times a day (QID) | ORAL | Status: DC | PRN

## 2017-05-07 MED ORDER — SODIUM CHLORIDE 0.9 % IV SOLN
INTRAVENOUS | Status: AC
  Administered 2017-05-07: 05:00:00 via INTRAVENOUS

## 2017-05-07 MED ORDER — ROSUVASTATIN CALCIUM 5 MG PO TABS
5.0000 mg | ORAL_TABLET | Freq: Every day | ORAL | Status: DC
  Administered 2017-05-07 – 2017-05-11 (×5): 5 mg via ORAL
  Filled 2017-05-07 (×5): qty 1

## 2017-05-07 MED ORDER — WARFARIN SODIUM 7.5 MG PO TABS
7.5000 mg | ORAL_TABLET | Freq: Once | ORAL | Status: AC
  Administered 2017-05-07: 7.5 mg via ORAL
  Filled 2017-05-07: qty 1

## 2017-05-07 NOTE — Discharge Instructions (Addendum)
Information on my medicine - Coumadin   (Warfarin)  This medication education was reviewed with me or my healthcare representative as part of my discharge preparation.  The pharmacist that spoke with me during my hospital stay was:  Wayland Salinas, [facility]  Why was Coumadin prescribed for you? Coumadin was prescribed for you because you have a blood clot or a medical condition that can cause an increased risk of forming blood clots. Blood clots can cause serious health problems by blocking the flow of blood to the heart, lung, or brain. Coumadin can prevent harmful blood clots from forming. As a reminder your indication for Coumadin is:   Stroke Prevention Because Of Atrial Fibrillation  What test will check on my response to Coumadin? While on Coumadin (warfarin) you will need to have an INR test regularly to ensure that your dose is keeping you in the desired range. The INR (international normalized ratio) number is calculated from the result of the laboratory test called prothrombin time (PT).  If an INR APPOINTMENT HAS NOT ALREADY BEEN MADE FOR YOU please schedule an appointment to have this lab work done by your health care provider within 7 days. Your INR goal is usually a number between:  2 to 3 or your provider may give you a more narrow range like 2-2.5.  Ask your health care provider during an office visit what your goal INR is.  What  do you need to  know  About  COUMADIN? Take Coumadin (warfarin) exactly as prescribed by your healthcare provider about the same time each day.  DO NOT stop taking without talking to the doctor who prescribed the medication.  Stopping without other blood clot prevention medication to take the place of Coumadin may increase your risk of developing a new clot or stroke.  Get refills before you run out.  What do you do if you miss a dose? If you miss a dose, take it as soon as you remember on the same day then continue your regularly scheduled  regimen the next day.  Do not take two doses of Coumadin at the same time.  Important Safety Information A possible side effect of Coumadin (Warfarin) is an increased risk of bleeding. You should call your healthcare provider right away if you experience any of the following: ? Bleeding from an injury or your nose that does not stop. ? Unusual colored urine (red or dark brown) or unusual colored stools (red or black). ? Unusual bruising for unknown reasons. ? A serious fall or if you hit your head (even if there is no bleeding).  Some foods or medicines interact with Coumadin (warfarin) and might alter your response to warfarin. To help avoid this: ? Eat a balanced diet, maintaining a consistent amount of Vitamin K. ? Notify your provider about major diet changes you plan to make. ? Avoid alcohol or limit your intake to 1 drink for women and 2 drinks for men per day. (1 drink is 5 oz. wine, 12 oz. beer, or 1.5 oz. liquor.)  Make sure that ANY health care provider who prescribes medication for you knows that you are taking Coumadin (warfarin).  Also make sure the healthcare provider who is monitoring your Coumadin knows when you have started a new medication including herbals and non-prescription products.  Coumadin (Warfarin)  Major Drug Interactions  Increased Warfarin Effect Decreased Warfarin Effect  Alcohol (large quantities) Antibiotics (esp. Septra/Bactrim, Flagyl, Cipro) Amiodarone (Cordarone) Aspirin (ASA) Cimetidine (Tagamet) Megestrol (Megace) NSAIDs (ibuprofen,  naproxen, etc.) Piroxicam (Feldene) Propafenone (Rythmol SR) Propranolol (Inderal) Isoniazid (INH) Posaconazole (Noxafil) Barbiturates (Phenobarbital) Carbamazepine (Tegretol) Chlordiazepoxide (Librium) Cholestyramine (Questran) Griseofulvin Oral Contraceptives Rifampin Sucralfate (Carafate) Vitamin K   Coumadin (Warfarin) Major Herbal Interactions  Increased Warfarin Effect Decreased Warfarin Effect    Garlic Ginseng Ginkgo biloba Coenzyme Q10 Green tea St. Johns wort    Coumadin (Warfarin) FOOD Interactions  Eat a consistent number of servings per week of foods HIGH in Vitamin K (1 serving =  cup)  Collards (cooked, or boiled & drained) Kale (cooked, or boiled & drained) Mustard greens (cooked, or boiled & drained) Parsley *serving size only =  cup Spinach (cooked, or boiled & drained) Swiss chard (cooked, or boiled & drained) Turnip greens (cooked, or boiled & drained)  Eat a consistent number of servings per week of foods MEDIUM-HIGH in Vitamin K (1 serving = 1 cup)  Asparagus (cooked, or boiled & drained) Broccoli (cooked, boiled & drained, or raw & chopped) Brussel sprouts (cooked, or boiled & drained) *serving size only =  cup Lettuce, raw (green leaf, endive, romaine) Spinach, raw Turnip greens, raw & chopped   These websites have more information on Coumadin (warfarin):  FailFactory.se; VeganReport.com.au;   Additional discharge instructions  Please get your medications reviewed and adjusted by your Primary MD.  Please request your Primary MD to go over all Hospital Tests and Procedure/Radiological results at the follow up, please get all Hospital records sent to your Prim MD by signing hospital release before you go home.  If you had Pneumonia of Lung problems at the Hospital: Please get a 2 view Chest X ray done in 6-8 weeks after hospital discharge or sooner if instructed by your Primary MD.  If you have Congestive Heart Failure: Please call your Cardiologist or Primary MD anytime you have any of the following symptoms:  1) 3 pound weight gain in 24 hours or 5 pounds in 1 week  2) shortness of breath, with or without a dry hacking cough  3) swelling in the hands, feet or stomach  4) if you have to sleep on extra pillows at night in order to breathe  Follow cardiac low salt diet and 1.5 lit/day fluid restriction.  If you have  diabetes Accuchecks 4 times/day, Once in AM empty stomach and then before each meal. Log in all results and show them to your primary doctor at your next visit. If any glucose reading is under 80 or above 300 call your primary MD immediately.  If you have Seizure/Convulsions/Epilepsy: Please do not drive, operate heavy machinery, participate in activities at heights or participate in high speed sports until you have seen by Primary MD or a Neurologist and advised to do so again.  If you had Gastrointestinal Bleeding: Please ask your Primary MD to check a complete blood count within one week of discharge or at your next visit. Your endoscopic/colonoscopic biopsies that are pending at the time of discharge, will also need to followed by your Primary MD.  Get Medicines reviewed and adjusted. Please take all your medications with you for your next visit with your Primary MD  Please request your Primary MD to go over all hospital tests and procedure/radiological results at the follow up, please ask your Primary MD to get all Hospital records sent to his/her office.  If you experience worsening of your admission symptoms, develop shortness of breath, life threatening emergency, suicidal or homicidal thoughts you must seek medical attention immediately by calling 911 or calling your  MD immediately  if symptoms less severe.  You must read complete instructions/literature along with all the possible adverse reactions/side effects for all the Medicines you take and that have been prescribed to you. Take any new Medicines after you have completely understood and accpet all the possible adverse reactions/side effects.   Do not drive or operate heavy machinery when taking Pain medications.   Do not take more than prescribed Pain, Sleep and Anxiety Medications  Special Instructions: If you have smoked or chewed Tobacco  in the last 2 yrs please stop smoking, stop any regular Alcohol  and or any  Recreational drug use.  Wear Seat belts while driving.  Please note You were cared for by a hospitalist during your hospital stay. If you have any questions about your discharge medications or the care you received while you were in the hospital after you are discharged, you can call the unit and asked to speak with the hospitalist on call if the hospitalist that took care of you is not available. Once you are discharged, your primary care physician will handle any further medical issues. Please note that NO REFILLS for any discharge medications will be authorized once you are discharged, as it is imperative that you return to your primary care physician (or establish a relationship with a primary care physician if you do not have one) for your aftercare needs so that they can reassess your need for medications and monitor your lab values.  You can reach the hospitalist office at phone [phone] or fax [phone]   If you do not have a primary care physician, you can call [phone] for a physician referral.
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
## 2017-05-07 NOTE — Plan of Care (Signed)
  Problem: Clinical Measurements: Goal: Will remain free from infection Outcome: Progressing Goal: Respiratory complications will improve Outcome: Progressing   Problem: Coping: Goal: Level of anxiety will decrease Outcome: Progressing   Problem: Pain Managment: Goal: General experience of comfort will improve Outcome: Progressing Note:  Pt has had no pain during my shift.

## 2017-05-07 NOTE — Progress Notes (Signed)
ANTICOAGULATION CONSULT NOTE - Follow Up Consult  Pharmacy Consult for Coumadin Indication: atrial fibrillation  No Known Allergies  Patient Measurements: Height: 5\' 6"  (167.6 cm) Weight: 177 lb 11.2 oz (80.6 kg) IBW/kg (Calculated) : 63.8   Vital Signs: Temp: 98.6 F (37 C) (04/15 0554) Temp Source: Oral (04/15 0554) BP: 107/69 (04/15 0554) Pulse Rate: 97 (04/15 0554)  Labs: Recent Labs    05/06/17 0150 05/06/17 0408 05/07/17 0455  HGB 13.7  --  10.5*  HCT 40.8  --  32.3*  PLT 225  --  258  LABPROT  --  19.2* 24.5*  INR  --  1.64 2.22  CREATININE 3.71*  --  3.66*  CKTOTAL  --   --  52  CKMB  --   --  0.9  TROPONINI  --   --  <0.03    Estimated Creatinine Clearance: 15.5 mL/min (A) (by C-G formula based on SCr of 3.66 mg/dL (H)).   Assessment:  Anticoag: warfarin PTA for afib. INR 1.64>2.22 today. Hgb 13.7?>10.5 (closer to baseline) today.  Plts 258 ok. - Home dose (last clinic visit 3/11): 7.5 mg daily except 5 mg Sun/Wed  Goal of Therapy:  INR 2-3 Monitor platelets by anticoagulation protocol: Yes   Plan:  Repeat Coumadin 7.5mg  po x 1 tonight Daily INR  Dawnielle Christiana S. [name], PharmD, BCPS Clinical Staff Pharmacist Pager [phone]  Eilene Ghazi Stillinger 05/07/2017,8:41 AM

## 2017-05-08 DIAGNOSIS — A4151 Sepsis due to Escherichia coli [E. coli]: Secondary | ICD-10-CM

## 2017-05-08 LAB — CBC
HCT: 32.1 % — ABNORMAL LOW (ref 39.0–52.0)
Hemoglobin: 10.3 g/dL — ABNORMAL LOW (ref 13.0–17.0)
MCH: 30.5 pg (ref 26.0–34.0)
MCHC: 32.1 g/dL (ref 30.0–36.0)
MCV: 95 fL (ref 78.0–100.0)
Platelets: 241 10*3/uL (ref 150–400)
RBC: 3.38 MIL/uL — ABNORMAL LOW (ref 4.22–5.81)
RDW: 14.5 % (ref 11.5–15.5)
WBC: 8.8 10*3/uL (ref 4.0–10.5)

## 2017-05-08 LAB — PROTIME-INR
INR: 2.42
Prothrombin Time: 26.1 seconds — ABNORMAL HIGH (ref 11.4–15.2)

## 2017-05-08 MED ORDER — WARFARIN SODIUM 5 MG PO TABS: 5.0000 mg | ORAL_TABLET | ORAL | Status: DC

## 2017-05-08 MED ORDER — WARFARIN SODIUM 7.5 MG PO TABS
7.5000 mg | ORAL_TABLET | ORAL | Status: DC
  Administered 2017-05-08: 7.5 mg via ORAL
  Filled 2017-05-08: qty 1

## 2017-05-08 NOTE — Progress Notes (Signed)
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 No results for input(s): HGBA1C in the last 72 hours. CBG: No results for input(s): GLUCAP in the last 168 hours. Lipid Profile: No results for input(s): CHOL, HDL, LDLCALC, TRIG, CHOLHDL, LDLDIRECT in the last 72 hours. Thyroid Function Tests: No results for input(s): TSH, T4TOTAL, FREET4, T3FREE, THYROIDAB in the last 72 hours. Anemia Panel: No results for input(s): VITAMINB12, FOLATE, FERRITIN, TIBC, IRON, RETICCTPCT in the last 72 hours. Urine analysis:    Component Value Date/Time   COLORURINE YELLOW 05/06/2017 0056   APPEARANCEUR CLOUDY (A) 05/06/2017 0056   LABSPEC 1.015 05/06/2017 0056   PHURINE 5.0 05/06/2017 0056   GLUCOSEU NEGATIVE 05/06/2017 0056   HGBUR LARGE (A) 05/06/2017 0056   BILIRUBINUR NEGATIVE 05/06/2017 0056   KETONESUR NEGATIVE 05/06/2017 0056   PROTEINUR 30 (A) 05/06/2017 0056   UROBILINOGEN 0.2 10/17/2013 1631   NITRITE NEGATIVE 05/06/2017 0056   LEUKOCYTESUR LARGE (A) 05/06/2017 0056      Azlan Hanway M.D. [facility] 05/08/2017, 12:09 PM  Pager: [phone] Between 7am to 7pm - call Pager - [phone]  After 7pm go to [email or web address] - password [email or web address]  Call night coverage person covering after 7pm

## 2017-05-08 NOTE — Progress Notes (Signed)
ANTICOAGULATION CONSULT NOTE - Follow Up Consult  Pharmacy Consult for Coumadin Indication: atrial fibrillation  No Known Allergies  Patient Measurements: Height: 5\' 6"  (167.6 cm) Weight: 177 lb 11.2 oz (80.6 kg) IBW/kg (Calculated) : 63.8   Vital Signs: Temp: 98.4 F (36.9 C) (04/16 0529) Temp Source: Oral (04/16 0529) BP: 122/72 (04/16 0529) Pulse Rate: 77 (04/16 0529)  Labs: Recent Labs    05/06/17 0150 05/06/17 0408 05/07/17 0455 05/07/17 1042 05/07/17 1605 05/08/17 0413  HGB 13.7  --  10.5*  --   --  10.3*  HCT 40.8  --  32.3*  --   --  32.1*  PLT 225  --  258  --   --  241  LABPROT  --  19.2* 24.5*  --   --  26.1*  INR  --  1.64 2.22  --   --  2.42  CREATININE 3.71*  --  3.66*  --   --   --   CKTOTAL  --   --  52  --   --   --   CKMB  --   --  0.9  --   --   --   TROPONINI  --   --  <0.03 <0.03 <0.03  --     Estimated Creatinine Clearance: 15.5 mL/min (A) (by C-G formula based on SCr of 3.66 mg/dL (H)).   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 INR 1.64>2.22>2.42 today. Hgb 10.3 stable.  Plts 241 ok. - Home dose (last clinic visit 3/11): 7.5 mg daily except 5 mg Sun/Wed (admit INR 1.64)  Goal of Therapy:  INR 2-3 Monitor platelets by anticoagulation protocol: Yes   Plan:  Try resuming Coumadin home dose of 7.5mg  daily except 5mg  Sun/Wed  Anita Laguna S. [name], PharmD, BCPS Clinical Staff Pharmacist Pager [phone]  Eilene Ghazi Stillinger 05/08/2017,10:52 AM

## 2017-05-08 NOTE — Consult Note (Signed)
   [facility] Inpatient Consult   05/08/2017  KOLSTON LACOUNT 01/05/1933 [id number]  Patient screened for re-admission less than 30 days and assessed for [place] Management services. Patient is in the [place] of the [facility] services under patient's Medicare plan.  Patient's primary care provider is Dr. Cathlean Cower in which this office is listed to provide the transition of care follow up. Patient recently from [place].  Patient admitted with sepsis, health HX includes but not limited to CKD stage 3, and prostates CA.  Came by to speak with patient and he was just receiving his lunch.  Will follow up on potential [facility] Care Management needs.     Please place a [facility] Care Management consult or for questions contact:   Natividad Brood, RN BSN [facility] Liaison  [phone] business mobile phone Toll free office [phone]

## 2017-05-09 DIAGNOSIS — K859 Acute pancreatitis without necrosis or infection, unspecified: Secondary | ICD-10-CM

## 2017-05-09 LAB — BASIC METABOLIC PANEL
Anion gap: 8 (ref 5–15)
BUN: 41 mg/dL — ABNORMAL HIGH (ref 6–20)
CO2: 20 mmol/L — ABNORMAL LOW (ref 22–32)
Calcium: 8.7 mg/dL — ABNORMAL LOW (ref 8.9–10.3)
Chloride: 109 mmol/L (ref 101–111)
Creatinine, Ser: 2.88 mg/dL — ABNORMAL HIGH (ref 0.61–1.24)
GFR calc Af Amer: 22 mL/min — ABNORMAL LOW (ref >60)
GFR calc non Af Amer: 19 mL/min — ABNORMAL LOW (ref >60)
Glucose, Bld: 83 mg/dL (ref 65–99)
Potassium: 3.9 mmol/L (ref 3.5–5.1)
Sodium: 137 mmol/L (ref 135–145)

## 2017-05-09 LAB — CBC
HCT: 31.2 % — ABNORMAL LOW (ref 39.0–52.0)
Hemoglobin: 10.1 g/dL — ABNORMAL LOW (ref 13.0–17.0)
MCH: 30.7 pg (ref 26.0–34.0)
MCHC: 32.4 g/dL (ref 30.0–36.0)
MCV: 94.8 fL (ref 78.0–100.0)
Platelets: 268 10*3/uL (ref 150–400)
RBC: 3.29 MIL/uL — ABNORMAL LOW (ref 4.22–5.81)
RDW: 14.3 % (ref 11.5–15.5)
WBC: 9 10*3/uL (ref 4.0–10.5)

## 2017-05-09 LAB — PROTIME-INR
INR: 2.93
Prothrombin Time: 30.3 seconds — ABNORMAL HIGH (ref 11.4–15.2)

## 2017-05-09 MED ORDER — WARFARIN SODIUM 2.5 MG PO TABS
2.5000 mg | ORAL_TABLET | Freq: Once | ORAL | Status: AC
  Administered 2017-05-09: 2.5 mg via ORAL
  Filled 2017-05-09: qty 1

## 2017-05-09 NOTE — Evaluation (Signed)
Physical Therapy Evaluation Patient Details Name: Austin [name] MRN: [id number] DOB: April 03, 1932 Today's Date: 05/09/2017   History of Present Illness  [name] an 84 y/o male with recent placement of cholecystostomy tube, who presented with nausea/vomiting, and abdominal pain. Per notes, pt with possible UTI leading to sepsis. CT of abdomen showed cholecystostomy tube in place with numerous gall stones.   Clinical Impression  Pt admitted secondary to problem above with deficits below. Attempted to assist pt with transfer to bed using stedy, however, pt with increased extensor tone in LLE/trunk and unable to maintain appropriate position. Pt unable to tolerate deep pressure at L hip to break extensor tone, so maximove used to transfer pt back to bed safely. Recommend return to SNF for continued PT to address mobility deficits. Will continue to follow acutely to maximize functional mobility independence and safety.     Follow Up Recommendations SNF    Equipment Recommendations  None recommended by PT    Recommendations for Other Services OT consult     Precautions / Restrictions Precautions Precautions: Fall Restrictions Weight Bearing Restrictions: No      Mobility  Bed Mobility Overal bed mobility: Needs Assistance Bed Mobility: Rolling Rolling: Mod assist;Max assist         General bed mobility comments: Required use of maximove to return to supine. Mod A to roll to L side. Pt able to use bed rails to assist with rolling. rEquired max A to roll to R to remove lift pad.   Transfers Overall transfer level: Needs assistance               General transfer comment: Attempted to perform sit<>Stand transfer using stedy, however, pt with increased extensor tone and unable to perform anterior translation to stand. Attempted to apply deep pressure to hip, however, pt unable to tolerate secondary to soreness. RN present in room to assist with transfer using maximove. Increased  extensor tone noted in trunk as well as pt with limited anterior translation.   Ambulation/Gait             General Gait Details: Unable at this time  Financial trader Rankin (Stroke Patients Only)       Balance Overall balance assessment: Needs assistance Sitting-balance support: Feet supported;Single extremity supported Sitting balance-Leahy Scale: Poor                                       Pertinent Vitals/Pain Pain Assessment: Faces Faces Pain Scale: Hurts little more Pain Location: LUE/LLE  Pain Descriptors / Indicators: Grimacing;Guarding;Discomfort Pain Intervention(s): Limited activity within patient's tolerance;Monitored during session;Repositioned    Home Living Family/patient expects to be discharged to:: Skilled nursing facility                      Prior Function Level of Independence: Needs assistance   Gait / Transfers Assistance Needed: Was working with PT at SNF on mobility tasks.   ADL's / Homemaking Assistance Needed: Pt reports staff help with ADL tasks.         Hand Dominance   Dominant Hand: Right    Extremity/Trunk Assessment   Upper Extremity Assessment Upper Extremity Assessment: Defer to OT evaluation    Lower Extremity Assessment Lower Extremity Assessment: LLE deficits/detail LLE Deficits / Details: Increased extensor tone  noted in LLE. Unable to break at hip in sitting secondary to increased soreness with deep pressure. Able to break at knee with deep pressure.     Cervical / Trunk Assessment Cervical / Trunk Assessment: Other exceptions Cervical / Trunk Exceptions: extensor tone in trunk   Communication   Communication: No difficulties  Cognition Arousal/Alertness: Awake/alert Behavior During Therapy: WFL for tasks assessed/performed Overall Cognitive Status: History of cognitive impairments - at baseline                                         General Comments General comments (skin integrity, edema, etc.): Pt's wife present in room throughout     Exercises     Assessment/Plan    PT Assessment Patient needs continued PT services  PT Problem List Decreased balance;Decreased mobility;Decreased knowledge of use of DME;Decreased safety awareness;Decreased knowledge of precautions;Cardiopulmonary status limiting activity;Pain;Decreased strength;Decreased range of motion       PT Treatment Interventions DME instruction;Gait training;Functional mobility training;Therapeutic activities;Therapeutic exercise;Balance training;Patient/family education;Wheelchair mobility training;Neuromuscular re-education    PT Goals (Current goals can be found in the Care Plan section)  Acute Rehab PT Goals Patient Stated Goal: to get better  PT Goal Formulation: With patient Time For Goal Achievement: 05/23/17 Potential to Achieve Goals: Fair    Frequency Min 2X/week   Barriers to discharge        Co-evaluation               AM-PAC PT "6 Clicks" Daily Activity  Outcome Measure Difficulty turning over in bed (including adjusting bedclothes, sheets and blankets)?: Unable Difficulty moving from lying on back to sitting on the side of the bed? : Unable Difficulty sitting down on and standing up from a chair with arms (e.g., wheelchair, bedside commode, etc,.)?: Unable Help needed moving to and from a bed to chair (including a wheelchair)?: Total Help needed walking in hospital room?: Total Help needed climbing 3-5 steps with a railing? : Total 6 Click Score: 6    End of Session Equipment Utilized During Treatment: Gait belt Activity Tolerance: Patient tolerated treatment well Patient left: in bed;with call bell/phone within reach;with family/visitor present Nurse Communication: Mobility status;Need for lift equipment PT Visit Diagnosis: Unsteadiness on feet (R26.81);Muscle weakness (generalized) (M62.81);Other symptoms and signs  involving the nervous system (R29.898);Hemiplegia and hemiparesis Hemiplegia - Right/Left: Left    Time: 8309-4076 PT Time Calculation (min) (ACUTE ONLY): 42 min   Charges:   PT Evaluation $PT Eval Moderate Complexity: 1 Mod PT Treatments $Therapeutic Activity: 23-37 mins   PT G Codes:        Leighton Ruff, PT, DPT  Acute Rehabilitation Services  Pager: [phone]   Rudean Hitt 05/09/2017, 6:45 PM

## 2017-05-09 NOTE — Progress Notes (Signed)
Assessment completed, pt complained of burning pain at IV site, IV to RFA discontinued

## 2017-05-09 NOTE — Progress Notes (Signed)
[place] for Coumadin + Vanco/Zosyn Indication: h/o DVT and afib,  sepsis  No Known Allergies  Patient Measurements: Height: 5\' 6"  (167.6 cm) Weight: 176 lb 12.9 oz (80.2 kg) IBW/kg (Calculated) : 63.8  Vital Signs:    Labs: Recent Labs    05/07/17 0455 05/07/17 1042 05/07/17 1605 05/08/17 0413 05/09/17 0647  HGB 10.5*  --   --  10.3* 10.1*  HCT 32.3*  --   --  32.1* 31.2*  PLT 258  --   --  241 268  LABPROT 24.5*  --   --  26.1* 30.3*  INR 2.22  --   --  2.42 2.93  CREATININE 3.66*  --   --   --  2.88*  CKTOTAL 52  --   --   --   --   CKMB 0.9  --   --   --   --   TROPONINI <0.03 <0.03 <0.03  --   --     Estimated Creatinine Clearance: 19.7 mL/min (A) (by C-G formula based on SCr of 2.88 mg/dL (H)).   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 INR 1.64>2.22>2.42>2.93 today. Hgb 10.1 stable.  Plts 268 ok. - Home dose (last clinic visit 3/11): 7.5 mg daily except 5 mg Sun/Wed (admit INR 1.64)  ID: Sepsis 2/2 UTI vs GI. WBC 8.8 down, LA 1.11, Afebrile. + chronic foley. UA + leuk, many bacteria, wbc TNTC. CXR neg. 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 - Because B cepacia is almost always a colonizer, antimicrobial treatment is unnecessary and may be harmful unless infection is proven. - B cepacia, as a non-aeruginosa pseudomonad, is usually resistant to aminoglycosides, antipseudomonal penicillins, and antipseudomonal third-generation cephalosporins and polymyxin B. - B cepacia is often susceptible to trimethoprim plus sulfamethoxazole (TMP-SMX), cefepime, meropenem, minocycline, and  tigecycline and has varying susceptibility to fluoroquinolones.  Goal of Therapy:  INR 2-3 Monitor platelets by anticoagulation protocol: Yes   Plan:  Coumadin 2.5mg  po x 1 tonight. Daily INR Spoke with Dr. Tana Coast and would like to continue current antibiotics for now as patient has improved and we are awaiting sensitivities - Zosyn 2.25 gram IV q8 hours (Burkholderia may or may not be susceptible) - Vancomycin 1000 mg IV q48h-may be able to d/c  Doyce Stonehouse S. [name], PharmD, BCPS Clinical Staff Pharmacist Pager [phone]  Eilene Ghazi Stillinger 05/09/2017,11:27 AM

## 2017-05-09 NOTE — Progress Notes (Addendum)
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 8760 hours. HbA1C: No results for input(s): HGBA1C in the last 72 hours. CBG: No results for input(s): GLUCAP in the last 168 hours. Lipid Profile: No results for input(s): CHOL, HDL, LDLCALC, TRIG, CHOLHDL, LDLDIRECT in the last 72 hours. Thyroid Function Tests: No results for input(s): TSH, T4TOTAL, FREET4, T3FREE, THYROIDAB in the last 72 hours. Anemia Panel: No results for input(s): VITAMINB12, FOLATE, FERRITIN, TIBC, IRON, RETICCTPCT in the last 72 hours. Urine analysis:    Component Value Date/Time   COLORURINE YELLOW 05/06/2017 0056   APPEARANCEUR CLOUDY (A) 05/06/2017 0056   LABSPEC 1.015 05/06/2017 0056   PHURINE 5.0 05/06/2017 0056   GLUCOSEU NEGATIVE 05/06/2017 0056   HGBUR LARGE (A) 05/06/2017 0056   BILIRUBINUR NEGATIVE 05/06/2017 0056   KETONESUR NEGATIVE 05/06/2017 0056   PROTEINUR 30 (A) 05/06/2017 0056   UROBILINOGEN 0.2 10/17/2013 1631   NITRITE NEGATIVE 05/06/2017 0056   LEUKOCYTESUR LARGE (A)  05/06/2017 0056     Austin [name] M.D. [facility] 05/09/2017, 1:37 PM  Pager: [phone] Between 7am to 7pm - call Pager - [phone]  After 7pm go to [email or web address] - password [email or web address]  Call night coverage person covering after 7pm

## 2017-05-10 LAB — CBC
HCT: 30.7 % — ABNORMAL LOW (ref 39.0–52.0)
Hemoglobin: 10 g/dL — ABNORMAL LOW (ref 13.0–17.0)
MCH: 30.5 pg (ref 26.0–34.0)
MCHC: 32.6 g/dL (ref 30.0–36.0)
MCV: 93.6 fL (ref 78.0–100.0)
Platelets: 289 10*3/uL (ref 150–400)
RBC: 3.28 MIL/uL — ABNORMAL LOW (ref 4.22–5.81)
RDW: 14.2 % (ref 11.5–15.5)
WBC: 10.3 10*3/uL (ref 4.0–10.5)

## 2017-05-10 LAB — BASIC METABOLIC PANEL
Anion gap: 8 (ref 5–15)
BUN: 38 mg/dL — ABNORMAL HIGH (ref 6–20)
CO2: 19 mmol/L — ABNORMAL LOW (ref 22–32)
Calcium: 8.6 mg/dL — ABNORMAL LOW (ref 8.9–10.3)
Chloride: 107 mmol/L (ref 101–111)
Creatinine, Ser: 2.91 mg/dL — ABNORMAL HIGH (ref 0.61–1.24)
GFR calc Af Amer: 22 mL/min — ABNORMAL LOW (ref >60)
GFR calc non Af Amer: 19 mL/min — ABNORMAL LOW (ref >60)
Glucose, Bld: 94 mg/dL (ref 65–99)
Potassium: 4.1 mmol/L (ref 3.5–5.1)
Sodium: 134 mmol/L — ABNORMAL LOW (ref 135–145)

## 2017-05-10 LAB — PROTIME-INR
INR: 2.94
Prothrombin Time: 30.4 seconds — ABNORMAL HIGH (ref 11.4–15.2)

## 2017-05-10 MED ORDER — WARFARIN SODIUM 2.5 MG PO TABS
2.5000 mg | ORAL_TABLET | Freq: Once | ORAL | Status: AC
  Administered 2017-05-10: 2.5 mg via ORAL
  Filled 2017-05-10: qty 1

## 2017-05-10 MED ORDER — SODIUM CHLORIDE 0.9 % IV SOLN
INTRAVENOUS | Status: AC
  Administered 2017-05-10 – 2017-05-11 (×2): via INTRAVENOUS

## 2017-05-10 NOTE — Care Management Important Message (Signed)
Important Message  Patient Details  Name: Austin [name] MRN: [id number] Date of Birth: 1932/10/13   Medicare Important Message Given:  Yes    [name] 05/10/2017, 3:16 PM

## 2017-05-10 NOTE — Consult Note (Signed)
[facility] CM Inpatient Consult   05/10/2017  Austin [name] December 04, 1932 [id number]  Follow up:  Met with the patient and his wife at the bedside to introduce them to [facility] services. Wife was really guarded in the conversation.  The patient is an 84 year old with prostate CA, s/p cholecystectomy tube in the past for gallstones. Patient was assessed for [facility] program.  Spoke with inpatient and the plan currently is to return to [facility].   Wife accepted the brochure, 24 hour nurse advise line magnet and contact information.  Encouraged her to contact [place].  No needs verbalized.  Natividad Brood, RN BSN [facility] Liaison  [phone] business mobile phone Toll free office [phone]

## 2017-05-10 NOTE — Progress Notes (Signed)
ANTICOAGULATION CONSULT NOTE   Pharmacy Consult for Coumadin Indication: h/o DVT and afib  No Known Allergies  Patient Measurements: Height: 5\' 6"  (167.6 cm) Weight: 172 lb 13.5 oz (78.4 kg) IBW/kg (Calculated) : 63.8  Vital Signs: Temp: 97.9 F (36.6 C) (04/18 1306) Temp Source: Oral (04/18 0857) BP: 111/73 (04/18 1306) Pulse Rate: 80 (04/18 1306)  Labs: Recent Labs    05/07/17 1605  05/08/17 0413 05/09/17 0647 05/10/17 0426  HGB  --    < > 10.3* 10.1* 10.0*  HCT  --   --  32.1* 31.2* 30.7*  PLT  --   --  241 268 289  LABPROT  --   --  26.1* 30.3* 30.4*  INR  --   --  2.42 2.93 2.94  CREATININE  --   --   --  2.88* 2.91*  TROPONINI <0.03  --   --   --   --    < > = values in this interval not displayed.    Estimated Creatinine Clearance: 19.3 mL/min (A) (by C-G formula based on SCr of 2.91 mg/dL (H)).   Assessment:   Anticoag: warfarin PTA for afib, History of bilateral lower extremity DVT status post IVC. INR 2.94 today. Hgb 10 stable.  Plts 289 ok. - Home dose (last clinic visit 3/11): 7.5 mg daily except 5 mg Sun/Wed (admit INR 1.64)   Goal of Therapy:  INR 2-3 Monitor platelets by anticoagulation protocol: Yes   Plan:  Coumadin 2.5mg  po x 1 again tonight. Daily INR   Hollie Wojahn S. [name], PharmD, BCPS Clinical Staff Pharmacist Pager [phone]  Eilene Ghazi Stillinger 05/10/2017,1:14 PM

## 2017-05-10 NOTE — Progress Notes (Signed)
[facility]                                                                              Patient Demographics  Austin [name], is a 84 y.o. male, DOB - [birth year], TFT:[id number]  Admit date - 05/05/2017   Admitting Physician Jani Gravel, MD  Outpatient Primary MD for the patient is [name], MD  Outpatient specialists:   LOS - 4  days   Medical records reviewed and are as summarized below:    Chief Complaint  Patient presents with  . Emesis       Brief summary   Patient is a 84 year old male with CKD stage III, hypertension, hyperlipidemia, atrial fibrillation, prostate CA who was recently admitted in 03/2017 for cholecystitis with sepsis and status post percutaneous cholecystostomy tube.  Patient presented to ED with nausea, vomiting, abdominal pain for the last 3 days.  Tachycardiac on arrival, WBCs 14.9, patient was admitted for further workup.   Assessment & Plan    Principal Problem:   Sepsis secondary to UTI ([place]) -Met sepsis criteria due to tachycardia, leukocytosis, source likely UTI -Recent history of cholecystitis, sepsis and status post cholecystostomy tube -CT abdomen showed cholecystostomy tube in place and numerous gallstones but no evidence of biliary abscess or dilatation. -Overall improving, urine culture showed more than 100,000 colonies of burkholderia species.  Blood cultures negative -Continue IV Zosyn, still awaiting sensitivities.  Discussed with Dr. Johnnye Sima, ID on call, Burkholderia is Pseudomonad bacteria, recommended to continue IV Zosyn for 5 days total, then stop. Today is day #5. However await for sensitivities as it may not be sensitive to Zosyn, at which point patient will need 5 days of imipenem or cefepime at SNF.  If patient is sensitive to oral fluoroquinolones, can be discharged on Cipro or Levaquin.  Active Problems: Chronic kidney disease stage III -Recent admission for sepsis, creatinine had been elevated up to 14.0  requiring temporary hemodialysis, was discharged with creatinine of 5.37 -Creatinine 3.71 on admission, creatinine improving, 2.9 today  History of cholecystitis status post cholecystostomy Currently stable, per CT abdomen, drain in place -If worsening abdominal discomfort, fevers, transaminitis, will need surgical reevaluation.    Tachycardia -Resolved likely due to #1 -2D echo showed EF of 40-45% with moderate diffuse hypokinesis, grade 1 diastolic dysfunction.  No prior echo to compare  Right lower lung bronchiectasis on CT scan -Follow-up outpatient with pulmonology  Hyperlipidemia -Continue Crestor, decrease to 5 mg due to renal dysfunction  History of bilateral lower extremity DVT status post IVC Continue Coumadin per pharmacy  GERD -Continue PPI   Prostate CA, Gleason 5 Continue Foley, follow-up urology outpatient  Code Status: Full CODE STATUS DVT Prophylaxis: Warfarin Family Communication: Discussed in detail with the patient, all imaging results, lab results explained to the patient   Disposition Plan:, awaiting urine culture sensitivities  Time Spent in minutes 25 minutes  Procedures:  None  Consultants:   Discussed with Dr. Johnnye Sima on phone  Antimicrobials:   IV vancomycin 4/14-> 4/18  IV Zosyn 4/14   Medications  Scheduled Meds: . pantoprazole  40 mg Oral Daily  . rosuvastatin  5  mg Oral q1800  . Warfarin - Pharmacist Dosing Inpatient   Does not apply q1800   Continuous Infusions: . piperacillin-tazobactam (ZOSYN)  IV 2.25 g (05/10/17 0537)   PRN Meds:.acetaminophen **OR** acetaminophen, ondansetron (ZOFRAN) IV   Antibiotics   Anti-infectives (From admission, onward)   Start     Dose/Rate Route Frequency Ordered Stop   05/08/17 0500  vancomycin (VANCOCIN) IVPB 1000 mg/200 mL premix  Status:  Discontinued     1,000 mg 200 mL/hr over 60 Minutes Intravenous Every 48 hours 05/07/17 2121 05/10/17 0914   05/08/17 0000  vancomycin (VANCOCIN)  IVPB 1000 mg/200 mL premix  Status:  Discontinued     1,000 mg 200 mL/hr over 60 Minutes Intravenous Every 48 hours 05/06/17 0810 05/07/17 2121   05/06/17 1400  piperacillin-tazobactam (ZOSYN) IVPB 3.375 g  Status:  Discontinued     3.375 g 12.5 mL/hr over 240 Minutes Intravenous Every 8 hours 05/06/17 0808 05/06/17 0813   05/06/17 1400  piperacillin-tazobactam (ZOSYN) IVPB 2.25 g     2.25 g 100 mL/hr over 30 Minutes Intravenous Every 8 hours 05/06/17 0813     05/06/17 0815  vancomycin (VANCOCIN) 1,500 mg in sodium chloride 0.9 % 500 mL IVPB     1,500 mg 250 mL/hr over 120 Minutes Intravenous  Once 05/06/17 0808 05/06/17 1129   05/06/17 0530  piperacillin-tazobactam (ZOSYN) IVPB 3.375 g     3.375 g 100 mL/hr over 30 Minutes Intravenous  Once 05/06/17 0528 05/06/17 1610        Subjective:   Austin [name] was seen and examined today.  Feeling better, no leukocytosis, appetite improving, no fevers or chills.  No nausea vomiting, no acute issues overnight.  Patient denies dizziness, chest pain, shortness of breath, new weakness, numbess, tingling.   Objective:   Vitals:   05/09/17 1402 05/09/17 2144 05/10/17 0547 05/10/17 0857  BP: 118/75 138/82 121/66 117/73  Pulse: 84 95 85 83  Resp: 14 18 18 20   Temp: 97.7 F (36.5 C) 98.2 F (36.8 C) 98 F (36.7 C) 97.7 F (36.5 C)  TempSrc: Oral Oral Oral Oral  SpO2: 100% 99% 100% 99%  Weight:   78.4 kg (172 lb 13.5 oz)   Height:        Intake/Output Summary (Last 24 hours) at 05/10/2017 1149 Last data filed at 05/10/2017 1022 Gross per 24 hour  Intake 300 ml  Output 900 ml  Net -600 ml     Wt Readings from Last 3 Encounters:  05/10/17 78.4 kg (172 lb 13.5 oz)  04/27/17 88.7 kg (195 lb 8.8 oz)  02/26/17 90.7 kg (200 lb)     Exam  General: Alert and oriented x 3, NAD Eyes: HEENT:  Atraumatic, normocephalic Cardiovascular: S1 S2 auscultated,  Regular rate and rhythm. No pedal edema b/l Respiratory: Clear to auscultation  bilaterally, no wheezing, rales or rhonchi Gastrointestinal: Soft, nontender, nondistended, + bowel sounds, + drain Ext: no pedal edema bilaterally Neuro: no new deficits Musculoskeletal: No digital cyanosis, clubbing Skin: No rashes Psych: Normal affect and demeanor, alert and oriented x3      Data Reviewed:  I have personally reviewed following labs and imaging studies  Micro Results Recent Results (from the past 240 hour(s))  Urine culture     Status: Abnormal (Preliminary result)   Collection Time: 05/06/17 12:56 AM  Result Value Ref Range Status   Specimen Description URINE, CATHETERIZED  Final   Special Requests NONE  Final   Culture (A)  Final    >=  100,000 COLONIES/mL BURKHOLDERIA SPECIES Sent to [place] for further susceptibility testing. Performed at [facility], [place] [address], [place], [place] [address]    Report Status PENDING  Incomplete  Blood Culture (routine x 2)     Status: None (Preliminary result)   Collection Time: 05/06/17  1:54 AM  Result Value Ref Range Status   Specimen Description BLOOD RIGHT HAND  Final   Special Requests   Final    BOTTLES DRAWN AEROBIC ONLY Blood Culture adequate volume   Culture   Final    NO GROWTH 3 DAYS Performed at [facility], [place] [address] [place], [place] [address]    Report Status PENDING  Incomplete  Blood Culture (routine x 2)     Status: None (Preliminary result)   Collection Time: 05/06/17  4:08 AM  Result Value Ref Range Status   Specimen Description BLOOD RIGHT ANTECUBITAL  Final   Special Requests   Final    BOTTLES DRAWN AEROBIC ONLY Blood Culture results may not be optimal due to an inadequate volume of blood received in culture bottles   Culture   Final    NO GROWTH 3 DAYS Performed at [facility], [place] [address] [place],  [address]    Report Status PENDING  Incomplete    Radiology Reports Ct Abdomen Pelvis Wo Contrast  Result Date: 05/06/2017 CLINICAL DATA:  Abdominal  distention, nausea and vomiting EXAM: CT ABDOMEN AND PELVIS WITHOUT CONTRAST TECHNIQUE: Multidetector CT imaging of the abdomen and pelvis was performed following the standard protocol without IV contrast. COMPARISON:  None. FINDINGS: Lower chest: There appears to be bronchiectasis in the right lower lobe with mucus inspissation noted within. No effusion or pneumothorax. Heart size is normal without pericardial effusion. Hepatobiliary: Numerous gallstones are seen within the gallbladder with percutaneous cholecystostomy tube in place projecting over the region of the gallbladder fundus. No abnormal fluid collection noted. The unenhanced liver is unremarkable. Pancreas: Mild hazy peripancreatic fatty induration is identified without ductal dilatation or mass. A mild pancreatitis is not excluded. Spleen: Normal size spleen. Adrenals/Urinary Tract: No adrenal mass. No obstructive uropathy. Foley decompressed urinary bladder. Stomach/Bowel: Moderate to large hiatal hernia. Decompressed stomach with normal small bowel rotation. No bowel obstruction or inflammation. Normal gallbladder, distal and terminal ileum. Moderate fecal retention within the right colon. No large bowel dilatation nor inflammation. Scattered left-sided colonic diverticulosis without acute diverticulitis. Moderate stool burden in the rectal vault. Vascular/Lymphatic: IVC filter in place. There is mild aortoiliac atherosclerosis without aneurysm. No lymphadenopathy. Reproductive: Prostate within normal limits with peripheral zone calcifications seen posteriorly. Other: No free air nor free fluid. Status post left inguinal hernia repair with plug in place. Fat containing right inguinal hernia. Musculoskeletal: Degenerative disc disease with vacuum disc phenomenon at L4-5. Lower lumbar facet arthropathy. No acute fracture, pars defects or listhesis. IMPRESSION: 1. Cholecystostomy tube in place. Numerous gallstones are seen without biliary dilatation. 2.  Mild peripancreatic hazy edema query pancreatitis without complication. 3. Large hiatal hernia. 4. Right lower lobe bronchiectasis with mild mucus inspissation. 5. No bowel obstruction or inflammation. Scattered colonic diverticulosis without acute diverticulitis. Moderate stool burden in the rectum. 6. Decompressed thick-walled bladder likely due to underdistention with Foley catheter in place. Electronically Signed   By: Ashley Royalty M.D.   On: 05/06/2017 03:38   Ir Perc Cholecystostomy  Result Date: 04/11/2017 INDICATION: Admitted with septic shock secondary to acute cholecystitis. Poor surgical candidate. EXAM: ULTRASOUND AND FLUOROSCOPIC-GUIDED CHOLECYSTOSTOMY TUBE PLACEMENT COMPARISON:  CT abdomen pelvis; right upper quadrant abdominal ultrasound - 04/10/2017 MEDICATIONS: The patient is currently admitted to the hospital and on intravenous antibiotics. Antibiotics were administered within an appropriate time frame prior to skin puncture. ANESTHESIA/SEDATION: Moderate (conscious) sedation was employed during this procedure. A total of Versed 2 mg was administered intravenously. Moderate Sedation Time: 10 minutes. The patient's level of consciousness and vital signs were monitored continuously by radiology nursing throughout the procedure under my direct supervision. CONTRAST:  10 cc Isovue-300 - administered into the gallbladder fossa. FLUOROSCOPY TIME:  30 seconds (5 mGy) COMPLICATIONS: None immediate. PROCEDURE: Informed written consent was obtained from the patient after a discussion of the risks, benefits and alternatives to treatment. Questions regarding the procedure were encouraged and answered. A timeout was performed prior to the initiation of the procedure. The right upper abdominal quadrant was prepped and draped in the usual sterile fashion, and a sterile drape was applied covering the operative field. Maximum barrier sterile technique with sterile gowns and gloves were used for the procedure. A  timeout was performed prior to the initiation of the procedure. Local anesthesia was provided with 1% lidocaine with epinephrine. Ultrasound scanning of the right upper quadrant demonstrates a markedly dilated gallbladder with echogenic shadowing gallstones. Utilizing a transhepatic approach, a 22 gauge needle was advanced into the gallbladder under direct ultrasound guidance. An ultrasound image was saved for documentation purposes. Appropriate intraluminal puncture was confirmed with the efflux of bile and advancement of an 0.018 wire into the gallbladder lumen. The needle was exchanged for an [place] set. A small amount of contrast was injected to confirm appropriate intraluminal positioning. Over a Benson wire, a 30.2-French Cook cholecystomy tube was advanced into the gallbladder fossa, coiled and locked. Bile was aspirated and a small amount of contrast was injected as several post procedural spot radiographic images were obtained in various obliquities. The catheter was secured to the skin with suture, connected to a drainage bag and a dressing was placed. The patient tolerated the procedure well without immediate post procedural complication. IMPRESSION: Successful ultrasound and fluoroscopic guided placement of a 10.2 French cholecystostomy tube. Electronically Signed   By: Sandi Mariscal M.D.   On: 04/11/2017 12:13   Dg Chest Port 1 View  Result Date: 05/06/2017 CLINICAL DATA:  Tachycardia, fevers, RIGHT upper quadrant pain. EXAM: PORTABLE CHEST 1 VIEW COMPARISON:  Chest radiograph April 18, 2017 FINDINGS: Cardiac silhouette is upper limits of normal in size. Calcified aortic arch. Tortuous calcified aorta versus hiatal hernia. No pleural effusion or focal consolidation. No pneumothorax. Osteopenia. Interval removal of LEFT central line without retained fragments. Osteopenia. IMPRESSION: Borderline cardiomegaly, no acute pulmonary process. Aortic Atherosclerosis (ICD10-I70.0). Electronically Signed    By: Elon Alas M.D.   On: 05/06/2017 01:49   Dg Chest Portable 1 View  Result Date: 04/18/2017 CLINICAL DATA:  Check central line placement EXAM: PORTABLE CHEST 1 VIEW COMPARISON:  04/11/2017 FINDINGS: Endotracheal tube and nasogastric catheter have been removed in the interval. Cardiac shadow is stable. Left jugular central line is noted with the tip in the mid superior vena cava. No pneumothorax is seen. No focal infiltrate is noted. IMPRESSION: No pneumothorax following central line placement Electronically Signed   By: Inez Catalina M.D.   On: 04/18/2017 17:28   Dg Chest Port 1 View  Result Date: 04/11/2017 CLINICAL DATA:  Intubated patient, septic shock, prostate malignancy, former smoker. EXAM: PORTABLE CHEST 1 VIEW COMPARISON:  Portable chest x-ray of April 09, 2016 FINDINGS: The lungs are hypoinflated  today. The endotracheal tube tip lies 3 cm above the carina. The interstitial markings of both lungs are coarse especially in the mid and lower lungs. The left hemidiaphragm is now obscured and the left retrocardiac region dense. The cardiac silhouette is enlarged. The pulmonary vascularity is not clearly engorged. The esophagogastric tube tip in proximal port project below the inferior margin of the image. IMPRESSION: Bilateral hypoinflation. Left lower lobe atelectasis or pneumonia. The endotracheal tube is in reasonable position radiographically. Electronically Signed   By: David  Martinique M.D.   On: 04/11/2017 08:35    Lab Data:  CBC: Recent Labs  Lab 05/06/17 0150 05/07/17 0455 05/08/17 0413 05/09/17 0647 05/10/17 0426  WBC 14.9* 11.6* 8.8 9.0 10.3  HGB 13.7 10.5* 10.3* 10.1* 10.0*  HCT 40.8 32.3* 32.1* 31.2* 30.7*  MCV 93.4 95.3 95.0 94.8 93.6  PLT 225 258 241 268 233   Basic Metabolic Panel: Recent Labs  Lab 05/06/17 0150 05/07/17 0455 05/09/17 0647 05/10/17 0426  NA 136 136 137 134*  K 4.6 4.5 3.9 4.1  CL 105 106 109 107  CO2 17* 18* 20* 19*  GLUCOSE 127* 93 83  94  BUN 64* 57* 41* 38*  CREATININE 3.71* 3.66* 2.88* 2.91*  CALCIUM 9.4 8.9 8.7* 8.6*   GFR: Estimated Creatinine Clearance: 19.3 mL/min (A) (by C-G formula based on SCr of 2.91 mg/dL (H)). Liver Function Tests: Recent Labs  Lab 05/06/17 0150 05/07/17 0455  AST 34 34  ALT 46 50  ALKPHOS 107 100  BILITOT 0.7 0.9  PROT 8.0 7.3  ALBUMIN 3.4* 3.0*   Recent Labs  Lab 05/06/17 0150 05/07/17 0455  LIPASE 52* 47   No results for input(s): AMMONIA in the last 168 hours. Coagulation Profile: Recent Labs  Lab 05/06/17 0408 05/07/17 0455 05/08/17 0413 05/09/17 0647 05/10/17 0426  INR 1.64 2.22 2.42 2.93 2.94   Cardiac Enzymes: Recent Labs  Lab 05/07/17 0455 05/07/17 1042 05/07/17 1605  CKTOTAL 52  --   --   CKMB 0.9  --   --   TROPONINI <0.03 <0.03 <0.03   BNP (last 3 results) No results for input(s): PROBNP in the last 8760 hours. HbA1C: No results for input(s): HGBA1C in the last 72 hours. CBG: No results for input(s): GLUCAP in the last 168 hours. Lipid Profile: No results for input(s): CHOL, HDL, LDLCALC, TRIG, CHOLHDL, LDLDIRECT in the last 72 hours. Thyroid Function Tests: No results for input(s): TSH, T4TOTAL, FREET4, T3FREE, THYROIDAB in the last 72 hours. Anemia Panel: No results for input(s): VITAMINB12, FOLATE, FERRITIN, TIBC, IRON, RETICCTPCT in the last 72 hours. Urine analysis:    Component Value Date/Time   COLORURINE YELLOW 05/06/2017 0056   APPEARANCEUR CLOUDY (A) 05/06/2017 0056   LABSPEC 1.015 05/06/2017 0056   PHURINE 5.0 05/06/2017 0056   GLUCOSEU NEGATIVE 05/06/2017 0056   HGBUR LARGE (A) 05/06/2017 0056   BILIRUBINUR NEGATIVE 05/06/2017 0056   KETONESUR NEGATIVE 05/06/2017 0056   PROTEINUR 30 (A) 05/06/2017 0056   UROBILINOGEN 0.2 10/17/2013 1631   NITRITE NEGATIVE 05/06/2017 0056   LEUKOCYTESUR LARGE (A) 05/06/2017 0056     Stokely Jeancharles M.D. [facility] 05/10/2017, 11:49 AM  Pager: [phone] Between 7am to 7pm - call  Pager - [phone]  After 7pm go to [email or web address] - password [email or web address]  Call night coverage person covering after 7pm

## 2017-05-10 NOTE — Evaluation (Signed)
Occupational Therapy Evaluation Patient Details Name: Austin [name] MRN: [id number] DOB: [birth year] Today's Date: 05/10/2017    History of Present Illness [name] an 84 y/o male with recent placement of cholecystostomy tube, who presented with nausea/vomiting, and abdominal pain. Per notes, pt with possible UTI leading to sepsis. CT of abdomen showed cholecystostomy tube in place with numerous gall stones.    Clinical Impression   Pt requires extensive assist for mobility and ADL. He is participating in OT at [facility] and plans to return. Will defer further OT to SNF.    Follow Up Recommendations  SNF;Supervision/Assistance - 24 hour    Equipment Recommendations       Recommendations for Other Services       Precautions / Restrictions Precautions Precautions: Fall Precaution Comments: L hand contracture with palm protector Restrictions Weight Bearing Restrictions: No      Mobility Bed Mobility Overal bed mobility: Needs Assistance Bed Mobility: Rolling;Sidelying to Sit Rolling: Max assist Sidelying to sit: Max assist Supine to sit: Total assist        Transfers                 General transfer comment: pt requires lift equipment    Balance Overall balance assessment: Needs assistance   Sitting balance-Leahy Scale: Poor Sitting balance - Comments: requires R UE propping Postural control: Right lateral lean                                 ADL either performed or assessed with clinical judgement   ADL Overall ADL's : At baseline                                             Vision Baseline Vision/History: Wears glasses Wears Glasses: Reading only Patient Visual Report: No change from baseline       Perception     Praxis      Pertinent Vitals/Pain Pain Assessment: No/denies pain     Hand Dominance Right   Extremity/Trunk Assessment Upper Extremity Assessment Upper Extremity Assessment: LUE  deficits/detail LUE Deficits / Details: extensor tone in elbow, flexion contracture in hand, longstanding LUE Coordination: decreased fine motor;decreased gross motor   Lower Extremity Assessment Lower Extremity Assessment: Defer to PT evaluation   Cervical / Trunk Assessment Cervical / Trunk Assessment: Other exceptions Cervical / Trunk Exceptions: extensor tone in trunk, weakness   Communication Communication Communication: No difficulties   Cognition Arousal/Alertness: Awake/alert Behavior During Therapy: WFL for tasks assessed/performed Overall Cognitive Status: History of cognitive impairments - at baseline                                     General Comments       Exercises     Shoulder Instructions      Home Living Family/patient expects to be discharged to:: Skilled nursing facility                                        Prior Functioning/Environment Level of Independence: Needs assistance  Gait / Transfers Assistance Needed: lifted with hoyer, was working on sitting balance and core strength  ADL's / Homemaking Assistance Needed: self feeds and participates in grooming, otherwise dependent   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        OT Problem List:        OT Treatment/Interventions:      OT Goals(Current goals can be found in the care plan section) Acute Rehab OT Goals Patient Stated Goal: to get better   OT Frequency:     Barriers to D/C:            Co-evaluation              AM-PAC PT "6 Clicks" Daily Activity     Outcome Measure Help from another person eating meals?: A Little Help from another person taking care of personal grooming?: A Little Help from another person toileting, which includes using toliet, bedpan, or urinal?: Total Help from another person bathing (including washing, rinsing, drying)?: Total Help from another person to put on and taking off regular upper body clothing?: Total Help from  another person to put on and taking off regular lower body clothing?: Total 6 Click Score: 10   End of Session    Activity Tolerance: Patient tolerated treatment well Patient left: in bed;with call bell/phone within reach;with nursing/sitter in room  OT Visit Diagnosis: Muscle weakness (generalized) (M62.81);Hemiplegia and hemiparesis Hemiplegia - Right/Left: Left Hemiplegia - dominant/non-dominant: Non-Dominant Hemiplegia - caused by: Unspecified                Time: 5852-7782 OT Time Calculation (min): 19 min Charges:  OT General Charges $OT Visit: 1 Visit OT Evaluation $OT Eval Moderate Complexity: 1 Mod G-Codes:     May 14, 2017 Nestor Lewandowsky, OTR/L Pager: [phone]  Werner Lean, Haze Boyden 05/14/2017, 10:20 AM

## 2017-05-10 NOTE — Plan of Care (Signed)
Patient is making progress towards discharge goal

## 2017-05-10 NOTE — Clinical Social Work Note (Signed)
Clinical Social Work Assessment  Patient Details  Name: Austin [name] MRN: [id number] Date of Birth: 1933-01-11  Date of referral:  05/10/17               Reason for consult:  Facility Placement, Discharge Planning                Permission sought to share information with:  Family Supports, Customer service manager Permission granted to share information::     Name::     Austin [name]  Agency::  [facility]  Relationship::  wife  Contact Information:  [phone]  Housing/Transportation Living arrangements for the past 2 months:  [place] of Information:  Patient, Spouse Patient Interpreter Needed:  None Criminal Activity/Legal Involvement Pertinent to Current Situation/Hospitalization:  No - Comment as needed Significant Relationships:  Adult Children, Spouse Lives with:  Self, Spouse Do you feel safe going back to the place where you live?  Yes Need for family participation in patient care:  Yes (Comment)  Care giving concerns:  Patients spouse at bedside. Patient came from [facility] and he has been at facility receiving short term rehab. Patient spouse Austin [name] stated patient will return back to facility   Social Worker assessment / plan:  CSW met family at bedside. Patient was engaged during assessment but let his wife do the talking. Austin [name] is very supportive of patient and does want patient to return to [facility]. CSW will follow up with facility for bed placement.  Employment status:  Retired Forensic scientist:  [employer] PT Recommendations:  [place] / Referral to community resources:  [place]  Patient/Family's Response to care:  Patient appreciate CSW role in care  Patient/Family's Understanding of and Emotional Response to Diagnosis, Current Treatment, and Prognosis:  Spouse wanting patient to go back to rehab to get strength back up   Emotional Assessment Appearance:  Appears older than stated  age Attitude/Demeanor/Rapport:  Engaged Affect (typically observed):  Accepting Orientation:  Oriented to Situation, Oriented to Self, Oriented to Place, Oriented to  Time Alcohol / Substance use:  Not Applicable Psych involvement (Current and /or in the community):  No (Comment)  Discharge Needs  Concerns to be addressed:  No discharge needs identified Readmission within the last 30 days:  No Current discharge risk:  Dependent with Mobility Barriers to Discharge:  Continued Medical Work up   [employer], LCSW 05/10/2017, 2:32 PM
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
## 2017-05-11 DIAGNOSIS — T849XXA Unspecified complication of internal orthopedic prosthetic device, implant and graft, initial encounter: Secondary | ICD-10-CM | POA: Diagnosis not present

## 2017-05-11 DIAGNOSIS — I959 Hypotension, unspecified: Secondary | ICD-10-CM | POA: Diagnosis present

## 2017-05-11 DIAGNOSIS — Z4659 Encounter for fitting and adjustment of other gastrointestinal appliance and device: Secondary | ICD-10-CM | POA: Diagnosis not present

## 2017-05-11 DIAGNOSIS — E785 Hyperlipidemia, unspecified: Secondary | ICD-10-CM | POA: Diagnosis present

## 2017-05-11 DIAGNOSIS — N183 Chronic kidney disease, stage 3 (moderate): Secondary | ICD-10-CM | POA: Diagnosis not present

## 2017-05-11 DIAGNOSIS — Z9889 Other specified postprocedural states: Secondary | ICD-10-CM | POA: Diagnosis not present

## 2017-05-11 DIAGNOSIS — R310 Gross hematuria: Secondary | ICD-10-CM | POA: Diagnosis not present

## 2017-05-11 DIAGNOSIS — Z6827 Body mass index (BMI) 27.0-27.9, adult: Secondary | ICD-10-CM | POA: Diagnosis not present

## 2017-05-11 DIAGNOSIS — K819 Cholecystitis, unspecified: Secondary | ICD-10-CM | POA: Diagnosis not present

## 2017-05-11 DIAGNOSIS — E7800 Pure hypercholesterolemia, unspecified: Secondary | ICD-10-CM | POA: Diagnosis present

## 2017-05-11 DIAGNOSIS — J9602 Acute respiratory failure with hypercapnia: Secondary | ICD-10-CM | POA: Diagnosis not present

## 2017-05-11 DIAGNOSIS — I4891 Unspecified atrial fibrillation: Secondary | ICD-10-CM | POA: Diagnosis present

## 2017-05-11 DIAGNOSIS — R197 Diarrhea, unspecified: Secondary | ICD-10-CM | POA: Diagnosis not present

## 2017-05-11 DIAGNOSIS — D72829 Elevated white blood cell count, unspecified: Secondary | ICD-10-CM | POA: Diagnosis not present

## 2017-05-11 DIAGNOSIS — T45515A Adverse effect of anticoagulants, initial encounter: Secondary | ICD-10-CM | POA: Diagnosis present

## 2017-05-11 DIAGNOSIS — R338 Other retention of urine: Secondary | ICD-10-CM

## 2017-05-11 DIAGNOSIS — T82519A Breakdown (mechanical) of unspecified cardiac and vascular devices and implants, initial encounter: Secondary | ICD-10-CM | POA: Diagnosis not present

## 2017-05-11 DIAGNOSIS — I10 Essential (primary) hypertension: Secondary | ICD-10-CM | POA: Diagnosis not present

## 2017-05-11 DIAGNOSIS — R079 Chest pain, unspecified: Secondary | ICD-10-CM | POA: Diagnosis not present

## 2017-05-11 DIAGNOSIS — A419 Sepsis, unspecified organism: Secondary | ICD-10-CM

## 2017-05-11 DIAGNOSIS — Z7901 Long term (current) use of anticoagulants: Secondary | ICD-10-CM | POA: Diagnosis not present

## 2017-05-11 DIAGNOSIS — I482 Chronic atrial fibrillation: Secondary | ICD-10-CM | POA: Diagnosis not present

## 2017-05-11 DIAGNOSIS — G9009 Other idiopathic peripheral autonomic neuropathy: Secondary | ICD-10-CM | POA: Diagnosis not present

## 2017-05-11 DIAGNOSIS — Z9049 Acquired absence of other specified parts of digestive tract: Secondary | ICD-10-CM | POA: Diagnosis not present

## 2017-05-11 DIAGNOSIS — G8194 Hemiplegia, unspecified affecting left nondominant side: Secondary | ICD-10-CM | POA: Diagnosis present

## 2017-05-11 DIAGNOSIS — Z8249 Family history of ischemic heart disease and other diseases of the circulatory system: Secondary | ICD-10-CM | POA: Diagnosis not present

## 2017-05-11 DIAGNOSIS — R7881 Bacteremia: Secondary | ICD-10-CM | POA: Diagnosis not present

## 2017-05-11 DIAGNOSIS — N179 Acute kidney failure, unspecified: Secondary | ICD-10-CM | POA: Diagnosis not present

## 2017-05-11 DIAGNOSIS — M6281 Muscle weakness (generalized): Secondary | ICD-10-CM | POA: Diagnosis not present

## 2017-05-11 DIAGNOSIS — I129 Hypertensive chronic kidney disease with stage 1 through stage 4 chronic kidney disease, or unspecified chronic kidney disease: Secondary | ICD-10-CM | POA: Diagnosis present

## 2017-05-11 DIAGNOSIS — R5381 Other malaise: Secondary | ICD-10-CM | POA: Diagnosis not present

## 2017-05-11 DIAGNOSIS — K8000 Calculus of gallbladder with acute cholecystitis without obstruction: Secondary | ICD-10-CM | POA: Diagnosis not present

## 2017-05-11 DIAGNOSIS — E860 Dehydration: Secondary | ICD-10-CM | POA: Diagnosis not present

## 2017-05-11 DIAGNOSIS — Z86010 Personal history of colonic polyps: Secondary | ICD-10-CM | POA: Diagnosis not present

## 2017-05-11 DIAGNOSIS — I69354 Hemiplegia and hemiparesis following cerebral infarction affecting left non-dominant side: Secondary | ICD-10-CM | POA: Diagnosis not present

## 2017-05-11 DIAGNOSIS — Z7401 Bed confinement status: Secondary | ICD-10-CM | POA: Diagnosis not present

## 2017-05-11 DIAGNOSIS — K8020 Calculus of gallbladder without cholecystitis without obstruction: Secondary | ICD-10-CM | POA: Diagnosis not present

## 2017-05-11 DIAGNOSIS — I5042 Chronic combined systolic (congestive) and diastolic (congestive) heart failure: Secondary | ICD-10-CM | POA: Diagnosis not present

## 2017-05-11 DIAGNOSIS — Z8261 Family history of arthritis: Secondary | ICD-10-CM | POA: Diagnosis not present

## 2017-05-11 DIAGNOSIS — F0390 Unspecified dementia without behavioral disturbance: Secondary | ICD-10-CM | POA: Diagnosis present

## 2017-05-11 DIAGNOSIS — Z8546 Personal history of malignant neoplasm of prostate: Secondary | ICD-10-CM | POA: Diagnosis not present

## 2017-05-11 DIAGNOSIS — J309 Allergic rhinitis, unspecified: Secondary | ICD-10-CM | POA: Diagnosis present

## 2017-05-11 DIAGNOSIS — Z87891 Personal history of nicotine dependence: Secondary | ICD-10-CM | POA: Diagnosis not present

## 2017-05-11 DIAGNOSIS — Z801 Family history of malignant neoplasm of trachea, bronchus and lung: Secondary | ICD-10-CM | POA: Diagnosis not present

## 2017-05-11 DIAGNOSIS — I480 Paroxysmal atrial fibrillation: Secondary | ICD-10-CM

## 2017-05-11 DIAGNOSIS — K812 Acute cholecystitis with chronic cholecystitis: Secondary | ICD-10-CM | POA: Diagnosis not present

## 2017-05-11 DIAGNOSIS — R410 Disorientation, unspecified: Secondary | ICD-10-CM | POA: Diagnosis not present

## 2017-05-11 DIAGNOSIS — D649 Anemia, unspecified: Secondary | ICD-10-CM

## 2017-05-11 DIAGNOSIS — R2681 Unsteadiness on feet: Secondary | ICD-10-CM | POA: Diagnosis not present

## 2017-05-11 DIAGNOSIS — E43 Unspecified severe protein-calorie malnutrition: Secondary | ICD-10-CM | POA: Diagnosis present

## 2017-05-11 DIAGNOSIS — N312 Flaccid neuropathic bladder, not elsewhere classified: Secondary | ICD-10-CM | POA: Diagnosis not present

## 2017-05-11 DIAGNOSIS — N390 Urinary tract infection, site not specified: Secondary | ICD-10-CM | POA: Diagnosis not present

## 2017-05-11 DIAGNOSIS — T85520A Displacement of bile duct prosthesis, initial encounter: Secondary | ICD-10-CM | POA: Diagnosis not present

## 2017-05-11 DIAGNOSIS — R4182 Altered mental status, unspecified: Secondary | ICD-10-CM | POA: Diagnosis not present

## 2017-05-11 DIAGNOSIS — A0472 Enterocolitis due to Clostridium difficile, not specified as recurrent: Secondary | ICD-10-CM | POA: Diagnosis present

## 2017-05-11 DIAGNOSIS — Z87820 Personal history of traumatic brain injury: Secondary | ICD-10-CM | POA: Diagnosis not present

## 2017-05-11 DIAGNOSIS — R112 Nausea with vomiting, unspecified: Secondary | ICD-10-CM | POA: Diagnosis not present

## 2017-05-11 DIAGNOSIS — A09 Infectious gastroenteritis and colitis, unspecified: Secondary | ICD-10-CM | POA: Diagnosis not present

## 2017-05-11 DIAGNOSIS — K8010 Calculus of gallbladder with chronic cholecystitis without obstruction: Secondary | ICD-10-CM | POA: Diagnosis present

## 2017-05-11 DIAGNOSIS — T85628A Displacement of other specified internal prosthetic devices, implants and grafts, initial encounter: Secondary | ICD-10-CM | POA: Diagnosis not present

## 2017-05-11 DIAGNOSIS — Z79899 Other long term (current) drug therapy: Secondary | ICD-10-CM | POA: Diagnosis not present

## 2017-05-11 DIAGNOSIS — Z87828 Personal history of other (healed) physical injury and trauma: Secondary | ICD-10-CM | POA: Diagnosis not present

## 2017-05-11 DIAGNOSIS — C61 Malignant neoplasm of prostate: Secondary | ICD-10-CM | POA: Diagnosis not present

## 2017-05-11 DIAGNOSIS — G839 Paralytic syndrome, unspecified: Secondary | ICD-10-CM | POA: Diagnosis present

## 2017-05-11 DIAGNOSIS — K9186 Retained cholelithiasis following cholecystectomy: Secondary | ICD-10-CM | POA: Diagnosis not present

## 2017-05-11 DIAGNOSIS — R6520 Severe sepsis without septic shock: Secondary | ICD-10-CM | POA: Diagnosis not present

## 2017-05-11 DIAGNOSIS — K810 Acute cholecystitis: Secondary | ICD-10-CM | POA: Diagnosis not present

## 2017-05-11 LAB — BASIC METABOLIC PANEL
Anion gap: 10 (ref 5–15)
BUN: 31 mg/dL — ABNORMAL HIGH (ref 6–20)
CO2: 19 mmol/L — ABNORMAL LOW (ref 22–32)
Calcium: 8.8 mg/dL — ABNORMAL LOW (ref 8.9–10.3)
Chloride: 108 mmol/L (ref 101–111)
Creatinine, Ser: 2.65 mg/dL — ABNORMAL HIGH (ref 0.61–1.24)
GFR calc Af Amer: 24 mL/min — ABNORMAL LOW (ref >60)
GFR calc non Af Amer: 21 mL/min — ABNORMAL LOW (ref >60)
Glucose, Bld: 81 mg/dL (ref 65–99)
Potassium: 4 mmol/L (ref 3.5–5.1)
Sodium: 137 mmol/L (ref 135–145)

## 2017-05-11 LAB — CULTURE, BLOOD (ROUTINE X 2)
Culture: NO GROWTH
Culture: NO GROWTH
Special Requests: ADEQUATE

## 2017-05-11 LAB — CBC
HCT: 30.7 % — ABNORMAL LOW (ref 39.0–52.0)
Hemoglobin: 9.9 g/dL — ABNORMAL LOW (ref 13.0–17.0)
MCH: 30.6 pg (ref 26.0–34.0)
MCHC: 32.2 g/dL (ref 30.0–36.0)
MCV: 94.8 fL (ref 78.0–100.0)
Platelets: 282 10*3/uL (ref 150–400)
RBC: 3.24 MIL/uL — ABNORMAL LOW (ref 4.22–5.81)
RDW: 14.3 % (ref 11.5–15.5)
WBC: 10.1 10*3/uL (ref 4.0–10.5)

## 2017-05-11 LAB — PROTIME-INR
INR: 2.53
Prothrombin Time: 27.1 seconds — ABNORMAL HIGH (ref 11.4–15.2)

## 2017-05-11 MED ORDER — ROSUVASTATIN CALCIUM 10 MG PO TABS: 10.0000 mg | ORAL_TABLET | Freq: Every day | ORAL | Status: DC

## 2017-05-11 MED ORDER — WARFARIN SODIUM 7.5 MG PO TABS
7.5000 mg | ORAL_TABLET | Freq: Once | ORAL | Status: AC
  Administered 2017-05-11: 7.5 mg via ORAL
  Filled 2017-05-11: qty 1

## 2017-05-11 MED ORDER — WARFARIN SODIUM 5 MG PO TABS: 5.0000 mg | ORAL_TABLET | Freq: Every day | ORAL | Status: DC

## 2017-05-11 NOTE — Progress Notes (Signed)
Physical Therapy Treatment Patient Details Name: Austin [name] MRN: [id number] DOB: 1932/12/22 Today's Date: 05/11/2017    History of Present Illness [name] an 84 y/o male with recent placement of cholecystostomy tube, who presented with nausea/vomiting, and abdominal pain. Per notes, pt with possible UTI leading to sepsis. CT of abdomen showed cholecystostomy tube in place with numerous gall stones.     PT Comments    Pt tolerated treatment well and had improved mobility after NMR in supine. Pt continues with strong extensor tone limiting functional transfers and will continue to benefit from SNF level of care at d/c.   Follow Up Recommendations  SNF     Equipment Recommendations  None recommended by PT    Recommendations for Other Services       Precautions / Restrictions Precautions Precautions: Fall Precaution Comments: L hand contracture with palm protector Restrictions Weight Bearing Restrictions: No    Mobility  Bed Mobility     Rolling: Max assist   Supine to sit: Max assist   Sit to sidelying: Max assist General bed mobility comments: pt limited by Lt LE and trunk extensor tone. Requires assist to move LT LE and to flex at trunk to come into sitting  Transfers                    Ambulation/Gait                 Stairs             Wheelchair Mobility    Modified Rankin (Stroke Patients Only)       Balance       Sitting balance - Comments: pt performed sitting balance with reaching tasks with pt requiring min A for sitting balance without UE support. pt prefers Rt UE support for sitting balance. pt able to reach all directions and promote trunk rotation with min A and manual facilitation                                    Cognition Arousal/Alertness: Awake/alert Behavior During Therapy: Flat affect Overall Cognitive Status: History of cognitive impairments - at baseline                                         Exercises Other Exercises Other Exercises: supine lower trunk rotation with increased time to flex Lt LE.  PROM lower trunk rotation to reduce tone and improve mobility.  lower trunk rotation does improve pt's mobility before attempting seated balance tasks    General Comments        Pertinent Vitals/Pain Pain Assessment: No/denies pain    Home Living                      Prior Function            PT Goals (current goals can now be found in the care plan section) Progress towards PT goals: Progressing toward goals    Frequency    Min 2X/week      PT Plan Current plan remains appropriate    Co-evaluation              AM-PAC PT "6 Clicks" Daily Activity  Outcome Measure  Difficulty turning over in bed (including adjusting bedclothes, sheets and blankets)?: A  Lot Difficulty moving from lying on back to sitting on the side of the bed? : A Lot Difficulty sitting down on and standing up from a chair with arms (e.g., wheelchair, bedside commode, etc,.)?: Unable Help needed moving to and from a bed to chair (including a wheelchair)?: Total Help needed walking in hospital room?: Total Help needed climbing 3-5 steps with a railing? : Total 6 Click Score: 8    End of Session   Activity Tolerance: Patient tolerated treatment well Patient left: in bed;with call bell/phone within reach;with family/visitor present;with bed alarm set   PT Visit Diagnosis: Unsteadiness on feet (R26.81);Muscle weakness (generalized) (M62.81);Other symptoms and signs involving the nervous system (R29.898);Hemiplegia and hemiparesis Hemiplegia - Right/Left: Left     Time: 2162-4469 PT Time Calculation (min) (ACUTE ONLY): 30 min  Charges:  $Neuromuscular Re-education: 23-37 mins                    G Codes:       Isabelle Course, PT, DPT   Fumio Vandam 05/11/2017, 8:27 AM

## 2017-05-11 NOTE — Progress Notes (Signed)
[name] to be D/C'd to [facility] per MD order. Discussed with the patient and wife and all questions fully answered. Report called to Lanelle Bal, LPN at [facility]. VVS, Skin clean and dry. No evidence of skin tears noted.  IV catheter discontinued intact. Site without signs and symptoms of complications. Dressing and pressure applied.  An After Visit Summary was printed and given to the patient's wife and another copy on chart for PTAR.  Patient to be escorted via PTAR.   Austin [name]  05/11/2017 6:54 PM

## 2017-05-11 NOTE — Progress Notes (Signed)
[place] for Coumadin Indication: h/o DVT and afib  No Known Allergies  Patient Measurements: Height: 5\' 6"  (167.6 cm) Weight: 179 lb 0.2 oz (81.2 kg) IBW/kg (Calculated) : 63.8  Vital Signs: Temp: 98.6 F (37 C) (04/19 0440) Temp Source: Oral (04/19 0440) BP: 122/74 (04/19 0440) Pulse Rate: 85 (04/19 0440)  Labs: Recent Labs    05/09/17 0647 05/10/17 0426 05/11/17 0704  HGB 10.1* 10.0* 9.9*  HCT 31.2* 30.7* 30.7*  PLT 268 289 282  LABPROT 30.3* 30.4* 27.1*  INR 2.93 2.94 2.53  CREATININE 2.88* 2.91* 2.65*    Estimated Creatinine Clearance: 21.5 mL/min (A) (by C-G formula based on SCr of 2.65 mg/dL (H)).   Assessment:   Anticoag: warfarin PTA for afib, History of bilateral lower extremity DVT status post IVC. INR 2.53 today. Hgb 9.9 stable.  Plts 282 ok. - Home dose (last clinic visit 3/11): 7.5 mg daily except 5 mg Sun/Wed (admit INR 1.64)   Goal of Therapy:  INR 2-3 Monitor platelets by anticoagulation protocol: Yes   Plan:  Coumadin 7.5mg  po x 1 . Daily INR  Ledora Delker A. Levada Dy, PharmD, [place] Pager: [phone] 05/11/2017,10:50 AM
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
## 2017-05-11 NOTE — Progress Notes (Signed)
Clinical Social Worker facilitated patient discharge including contacting patient family and facility to confirm patient discharge plans.  Clinical information faxed to facility and family agreeable with plan.  CSW arranged ambulance transport via [facility] to [facility] .  RN to call [phone] (pt will go in room 105) for report prior to discharge.  Clinical Social Worker will sign off for now as social work intervention is no longer needed. Please consult [place] again if new need arises.  Rhea Pink, MSW, [place]

## 2017-05-11 NOTE — Clinical Social Work Placement (Signed)
   CLINICAL SOCIAL WORK PLACEMENT  NOTE  Date:  05/11/2017  Patient Details  Name: Austin [name] MRN: [id number] Date of Birth: 11-09-1932  Clinical Social Work is seeking post-discharge placement for this patient at the [place] level of care (*CSW will initial, date and re-position this form in  chart as items are completed):  Yes   Patient/family provided with [place] Work Department's list of facilities offering this level of care within the geographic area requested by the patient (or if unable, by the patient's family).  Yes   Patient/family informed of their freedom to choose among providers that offer the needed level of care, that participate in Medicare, Medicaid or managed care program needed by the patient, have an available bed and are willing to accept the patient.  Yes   Patient/family informed of [place]'s ownership interest in [facility] and [facility], as well as of the fact that they are under no obligation to receive care at these facilities.  PASRR submitted to EDS on       PASRR number received on       Existing PASRR number confirmed on       FL2 transmitted to all facilities in geographic area requested by pt/family on       FL2 transmitted to all facilities within larger geographic area on       Patient informed that his/her managed care company has contracts with or will negotiate with certain facilities, including the following:        Yes   Patient/family informed of bed offers received.  Patient chooses bed at [facility]     Physician recommends and patient chooses bed at      Patient to be transferred to [facility] on 05/11/17.  Patient to be transferred to facility by [facility]     Patient family notified on 05/11/17 of transfer.  Name of family member notified:  spouse     PHYSICIAN       Additional Comment:    _______________________________________________ Wende Neighbors, LCSW 05/11/2017, 1:39 PM

## 2017-05-13 DIAGNOSIS — I10 Essential (primary) hypertension: Secondary | ICD-10-CM | POA: Diagnosis not present

## 2017-05-13 DIAGNOSIS — N183 Chronic kidney disease, stage 3 (moderate): Secondary | ICD-10-CM | POA: Diagnosis not present

## 2017-05-13 DIAGNOSIS — N390 Urinary tract infection, site not specified: Secondary | ICD-10-CM | POA: Diagnosis not present

## 2017-05-13 DIAGNOSIS — I482 Chronic atrial fibrillation: Secondary | ICD-10-CM | POA: Diagnosis not present

## 2017-05-16 DIAGNOSIS — I4891 Unspecified atrial fibrillation: Secondary | ICD-10-CM | POA: Diagnosis not present

## 2017-05-16 DIAGNOSIS — R310 Gross hematuria: Secondary | ICD-10-CM | POA: Diagnosis not present

## 2017-05-16 DIAGNOSIS — Z7901 Long term (current) use of anticoagulants: Secondary | ICD-10-CM | POA: Diagnosis not present

## 2017-05-16 DIAGNOSIS — N183 Chronic kidney disease, stage 3 (moderate): Secondary | ICD-10-CM | POA: Diagnosis not present

## 2017-05-17 LAB — SUSCEPTIBILITY RESULT

## 2017-05-17 LAB — SUSCEPTIBILITY, AER + ANAEROB

## 2017-05-18 DIAGNOSIS — R338 Other retention of urine: Secondary | ICD-10-CM | POA: Diagnosis not present

## 2017-05-18 DIAGNOSIS — N312 Flaccid neuropathic bladder, not elsewhere classified: Secondary | ICD-10-CM | POA: Diagnosis not present

## 2017-05-18 DIAGNOSIS — C61 Malignant neoplasm of prostate: Secondary | ICD-10-CM | POA: Diagnosis not present

## 2017-05-18 LAB — URINE CULTURE: Culture: >=100000 — AB

## 2017-05-21 DIAGNOSIS — N183 Chronic kidney disease, stage 3 (moderate): Secondary | ICD-10-CM | POA: Diagnosis not present

## 2017-05-21 DIAGNOSIS — R197 Diarrhea, unspecified: Secondary | ICD-10-CM | POA: Diagnosis not present

## 2017-05-21 DIAGNOSIS — D649 Anemia, unspecified: Secondary | ICD-10-CM | POA: Diagnosis not present

## 2017-05-21 DIAGNOSIS — R112 Nausea with vomiting, unspecified: Secondary | ICD-10-CM | POA: Diagnosis not present

## 2017-05-22 ENCOUNTER — Other Ambulatory Visit (HOSPITAL_COMMUNITY): Payer: Self-pay | Admitting: Interventional Radiology

## 2017-05-22 DIAGNOSIS — K819 Cholecystitis, unspecified: Secondary | ICD-10-CM

## 2017-05-22 DIAGNOSIS — R197 Diarrhea, unspecified: Secondary | ICD-10-CM | POA: Diagnosis not present

## 2017-05-22 DIAGNOSIS — I10 Essential (primary) hypertension: Secondary | ICD-10-CM | POA: Diagnosis not present

## 2017-05-22 DIAGNOSIS — R310 Gross hematuria: Secondary | ICD-10-CM | POA: Diagnosis not present

## 2017-05-22 DIAGNOSIS — K810 Acute cholecystitis: Secondary | ICD-10-CM | POA: Diagnosis not present

## 2017-05-22 DIAGNOSIS — I5042 Chronic combined systolic (congestive) and diastolic (congestive) heart failure: Secondary | ICD-10-CM | POA: Diagnosis not present

## 2017-05-23 ENCOUNTER — Other Ambulatory Visit: Payer: Self-pay | Admitting: Radiology

## 2017-05-23 ENCOUNTER — Ambulatory Visit: Payer: Medicare Other | Admitting: Internal Medicine

## 2017-05-24 ENCOUNTER — Other Ambulatory Visit (HOSPITAL_COMMUNITY): Payer: Self-pay | Admitting: Interventional Radiology

## 2017-05-24 ENCOUNTER — Encounter (HOSPITAL_COMMUNITY): Payer: Self-pay

## 2017-05-24 ENCOUNTER — Ambulatory Visit (HOSPITAL_COMMUNITY)
Admission: RE | Admit: 2017-05-24 | Discharge: 2017-05-24 | Disposition: A | Discharge status: Home / Self Care | Payer: Medicare Other | Source: Ambulatory Visit | Attending: Interventional Radiology | Admitting: Interventional Radiology

## 2017-05-24 DIAGNOSIS — G839 Paralytic syndrome, unspecified: Secondary | ICD-10-CM | POA: Diagnosis not present

## 2017-05-24 DIAGNOSIS — Z7901 Long term (current) use of anticoagulants: Secondary | ICD-10-CM | POA: Insufficient documentation

## 2017-05-24 DIAGNOSIS — D649 Anemia, unspecified: Secondary | ICD-10-CM | POA: Diagnosis not present

## 2017-05-24 DIAGNOSIS — N183 Chronic kidney disease, stage 3 (moderate): Secondary | ICD-10-CM | POA: Insufficient documentation

## 2017-05-24 DIAGNOSIS — Z87828 Personal history of other (healed) physical injury and trauma: Secondary | ICD-10-CM | POA: Insufficient documentation

## 2017-05-24 DIAGNOSIS — T85520A Displacement of bile duct prosthesis, initial encounter: Secondary | ICD-10-CM | POA: Diagnosis not present

## 2017-05-24 DIAGNOSIS — Z79899 Other long term (current) drug therapy: Secondary | ICD-10-CM | POA: Insufficient documentation

## 2017-05-24 DIAGNOSIS — Z4659 Encounter for fitting and adjustment of other gastrointestinal appliance and device: Secondary | ICD-10-CM | POA: Insufficient documentation

## 2017-05-24 DIAGNOSIS — K819 Cholecystitis, unspecified: Secondary | ICD-10-CM

## 2017-05-24 DIAGNOSIS — I129 Hypertensive chronic kidney disease with stage 1 through stage 4 chronic kidney disease, or unspecified chronic kidney disease: Secondary | ICD-10-CM | POA: Insufficient documentation

## 2017-05-24 DIAGNOSIS — Z8261 Family history of arthritis: Secondary | ICD-10-CM | POA: Insufficient documentation

## 2017-05-24 DIAGNOSIS — Z87891 Personal history of nicotine dependence: Secondary | ICD-10-CM | POA: Insufficient documentation

## 2017-05-24 DIAGNOSIS — R310 Gross hematuria: Secondary | ICD-10-CM | POA: Diagnosis not present

## 2017-05-24 DIAGNOSIS — Z8546 Personal history of malignant neoplasm of prostate: Secondary | ICD-10-CM | POA: Insufficient documentation

## 2017-05-24 DIAGNOSIS — Z8249 Family history of ischemic heart disease and other diseases of the circulatory system: Secondary | ICD-10-CM | POA: Insufficient documentation

## 2017-05-24 DIAGNOSIS — T85628A Displacement of other specified internal prosthetic devices, implants and grafts, initial encounter: Secondary | ICD-10-CM | POA: Diagnosis not present

## 2017-05-24 DIAGNOSIS — Z86010 Personal history of colonic polyps: Secondary | ICD-10-CM | POA: Insufficient documentation

## 2017-05-24 DIAGNOSIS — I4891 Unspecified atrial fibrillation: Secondary | ICD-10-CM | POA: Insufficient documentation

## 2017-05-24 DIAGNOSIS — K8000 Calculus of gallbladder with acute cholecystitis without obstruction: Secondary | ICD-10-CM | POA: Diagnosis not present

## 2017-05-24 DIAGNOSIS — R5381 Other malaise: Secondary | ICD-10-CM | POA: Diagnosis not present

## 2017-05-24 DIAGNOSIS — E7800 Pure hypercholesterolemia, unspecified: Secondary | ICD-10-CM | POA: Insufficient documentation

## 2017-05-24 DIAGNOSIS — R197 Diarrhea, unspecified: Secondary | ICD-10-CM | POA: Diagnosis not present

## 2017-05-24 DIAGNOSIS — Z9049 Acquired absence of other specified parts of digestive tract: Secondary | ICD-10-CM | POA: Insufficient documentation

## 2017-05-24 DIAGNOSIS — Z801 Family history of malignant neoplasm of trachea, bronchus and lung: Secondary | ICD-10-CM | POA: Insufficient documentation

## 2017-05-24 DIAGNOSIS — Z9889 Other specified postprocedural states: Secondary | ICD-10-CM | POA: Insufficient documentation

## 2017-05-24 HISTORY — PX: IR EXCHANGE BILIARY DRAIN: IMG6046

## 2017-05-24 MED ORDER — LIDOCAINE HCL 1 % IJ SOLN
INTRAMUSCULAR | Status: AC
  Filled 2017-05-24: qty 20

## 2017-05-24 MED ORDER — IOPAMIDOL (ISOVUE-300) INJECTION 61%
INTRAVENOUS | Status: AC
  Administered 2017-05-24: 15 mL
  Filled 2017-05-24: qty 50

## 2017-05-24 MED ORDER — LIDOCAINE HCL (PF) 1 % IJ SOLN
INTRAMUSCULAR | Status: DC | PRN
  Administered 2017-05-24: 2 mL

## 2017-05-24 MED ORDER — SODIUM CHLORIDE 0.9 % IV SOLN: INTRAVENOUS | Status: DC

## 2017-05-24 NOTE — Procedures (Signed)
Interventional Radiology Procedure Note  Procedure: Rescue of the withdrawn perc chole, with placement of a new 32F drain into gallbladder.   Complications: None Recommendations:  - continue current care - Do not submerge - Routine drain care  - Routine exchanges  Signed,  Dulcy Fanny. Earleen Newport, DO

## 2017-05-24 NOTE — Progress Notes (Signed)
Called [facility] for surgical evaluation for gallstones.   Appointment made with Dr. Hulen Skains 05/28/17 at [place] wife appointment information.   Brynda Greathouse, MS RD PA-C 11:11 AM
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
## 2017-05-24 NOTE — Progress Notes (Signed)
Patty,RN called to say pt does not need sedation so he does not need labs or IV

## 2017-05-28 DIAGNOSIS — K812 Acute cholecystitis with chronic cholecystitis: Secondary | ICD-10-CM | POA: Diagnosis not present

## 2017-05-30 DIAGNOSIS — D72829 Elevated white blood cell count, unspecified: Secondary | ICD-10-CM | POA: Diagnosis not present

## 2017-05-30 DIAGNOSIS — E860 Dehydration: Secondary | ICD-10-CM | POA: Diagnosis not present

## 2017-05-30 DIAGNOSIS — K819 Cholecystitis, unspecified: Secondary | ICD-10-CM | POA: Diagnosis not present

## 2017-05-30 DIAGNOSIS — N179 Acute kidney failure, unspecified: Secondary | ICD-10-CM | POA: Diagnosis not present

## 2017-05-31 ENCOUNTER — Other Ambulatory Visit: Payer: Self-pay

## 2017-05-31 ENCOUNTER — Inpatient Hospital Stay (HOSPITAL_COMMUNITY)
Admission: EM | Admit: 2017-05-31 | Discharge: 2017-06-03 | DRG: 682 | Disposition: A | Discharge status: Skilled Nursing Facility | Payer: Medicare Other | Attending: Internal Medicine | Admitting: Internal Medicine

## 2017-05-31 ENCOUNTER — Emergency Department (HOSPITAL_COMMUNITY): Payer: Medicare Other

## 2017-05-31 ENCOUNTER — Encounter (HOSPITAL_COMMUNITY): Payer: Self-pay | Admitting: Emergency Medicine

## 2017-05-31 ENCOUNTER — Inpatient Hospital Stay (HOSPITAL_COMMUNITY): Payer: Medicare Other

## 2017-05-31 DIAGNOSIS — N390 Urinary tract infection, site not specified: Secondary | ICD-10-CM | POA: Diagnosis present

## 2017-05-31 DIAGNOSIS — R310 Gross hematuria: Secondary | ICD-10-CM | POA: Diagnosis not present

## 2017-05-31 DIAGNOSIS — F0390 Unspecified dementia without behavioral disturbance: Secondary | ICD-10-CM | POA: Diagnosis not present

## 2017-05-31 DIAGNOSIS — N183 Chronic kidney disease, stage 3 unspecified: Secondary | ICD-10-CM | POA: Diagnosis present

## 2017-05-31 DIAGNOSIS — D72829 Elevated white blood cell count, unspecified: Secondary | ICD-10-CM | POA: Diagnosis not present

## 2017-05-31 DIAGNOSIS — J9602 Acute respiratory failure with hypercapnia: Secondary | ICD-10-CM | POA: Diagnosis not present

## 2017-05-31 DIAGNOSIS — R7881 Bacteremia: Secondary | ICD-10-CM | POA: Diagnosis not present

## 2017-05-31 DIAGNOSIS — I129 Hypertensive chronic kidney disease with stage 1 through stage 4 chronic kidney disease, or unspecified chronic kidney disease: Secondary | ICD-10-CM | POA: Diagnosis present

## 2017-05-31 DIAGNOSIS — R5381 Other malaise: Secondary | ICD-10-CM | POA: Diagnosis not present

## 2017-05-31 DIAGNOSIS — E43 Unspecified severe protein-calorie malnutrition: Secondary | ICD-10-CM

## 2017-05-31 DIAGNOSIS — Z801 Family history of malignant neoplasm of trachea, bronchus and lung: Secondary | ICD-10-CM | POA: Diagnosis not present

## 2017-05-31 DIAGNOSIS — D649 Anemia, unspecified: Secondary | ICD-10-CM | POA: Diagnosis present

## 2017-05-31 DIAGNOSIS — G9009 Other idiopathic peripheral autonomic neuropathy: Secondary | ICD-10-CM | POA: Diagnosis not present

## 2017-05-31 DIAGNOSIS — Z87891 Personal history of nicotine dependence: Secondary | ICD-10-CM | POA: Diagnosis not present

## 2017-05-31 DIAGNOSIS — G839 Paralytic syndrome, unspecified: Secondary | ICD-10-CM | POA: Diagnosis present

## 2017-05-31 DIAGNOSIS — K8010 Calculus of gallbladder with chronic cholecystitis without obstruction: Secondary | ICD-10-CM | POA: Diagnosis present

## 2017-05-31 DIAGNOSIS — R791 Abnormal coagulation profile: Secondary | ICD-10-CM

## 2017-05-31 DIAGNOSIS — M6281 Muscle weakness (generalized): Secondary | ICD-10-CM | POA: Diagnosis not present

## 2017-05-31 DIAGNOSIS — E7800 Pure hypercholesterolemia, unspecified: Secondary | ICD-10-CM | POA: Diagnosis present

## 2017-05-31 DIAGNOSIS — A09 Infectious gastroenteritis and colitis, unspecified: Secondary | ICD-10-CM | POA: Diagnosis not present

## 2017-05-31 DIAGNOSIS — T849XXA Unspecified complication of internal orthopedic prosthetic device, implant and graft, initial encounter: Secondary | ICD-10-CM | POA: Diagnosis not present

## 2017-05-31 DIAGNOSIS — I10 Essential (primary) hypertension: Secondary | ICD-10-CM | POA: Diagnosis present

## 2017-05-31 DIAGNOSIS — Z743 Need for continuous supervision: Secondary | ICD-10-CM | POA: Diagnosis not present

## 2017-05-31 DIAGNOSIS — I4891 Unspecified atrial fibrillation: Secondary | ICD-10-CM | POA: Diagnosis present

## 2017-05-31 DIAGNOSIS — R2681 Unsteadiness on feet: Secondary | ICD-10-CM | POA: Diagnosis not present

## 2017-05-31 DIAGNOSIS — K8020 Calculus of gallbladder without cholecystitis without obstruction: Secondary | ICD-10-CM | POA: Diagnosis not present

## 2017-05-31 DIAGNOSIS — A0472 Enterocolitis due to Clostridium difficile, not specified as recurrent: Secondary | ICD-10-CM | POA: Diagnosis not present

## 2017-05-31 DIAGNOSIS — G8194 Hemiplegia, unspecified affecting left nondominant side: Secondary | ICD-10-CM | POA: Diagnosis present

## 2017-05-31 DIAGNOSIS — Z87820 Personal history of traumatic brain injury: Secondary | ICD-10-CM

## 2017-05-31 DIAGNOSIS — K810 Acute cholecystitis: Secondary | ICD-10-CM | POA: Diagnosis not present

## 2017-05-31 DIAGNOSIS — Z8546 Personal history of malignant neoplasm of prostate: Secondary | ICD-10-CM | POA: Diagnosis not present

## 2017-05-31 DIAGNOSIS — R279 Unspecified lack of coordination: Secondary | ICD-10-CM | POA: Diagnosis not present

## 2017-05-31 DIAGNOSIS — E785 Hyperlipidemia, unspecified: Secondary | ICD-10-CM | POA: Diagnosis present

## 2017-05-31 DIAGNOSIS — R197 Diarrhea, unspecified: Secondary | ICD-10-CM | POA: Diagnosis not present

## 2017-05-31 DIAGNOSIS — Z7401 Bed confinement status: Secondary | ICD-10-CM | POA: Diagnosis not present

## 2017-05-31 DIAGNOSIS — Z86010 Personal history of colonic polyps: Secondary | ICD-10-CM

## 2017-05-31 DIAGNOSIS — Z6827 Body mass index (BMI) 27.0-27.9, adult: Secondary | ICD-10-CM

## 2017-05-31 DIAGNOSIS — N1832 Chronic kidney disease, stage 3b: Secondary | ICD-10-CM | POA: Diagnosis present

## 2017-05-31 DIAGNOSIS — Z7901 Long term (current) use of anticoagulants: Secondary | ICD-10-CM

## 2017-05-31 DIAGNOSIS — T45515A Adverse effect of anticoagulants, initial encounter: Secondary | ICD-10-CM | POA: Diagnosis present

## 2017-05-31 DIAGNOSIS — A498 Other bacterial infections of unspecified site: Secondary | ICD-10-CM

## 2017-05-31 DIAGNOSIS — N179 Acute kidney failure, unspecified: Principal | ICD-10-CM | POA: Diagnosis present

## 2017-05-31 DIAGNOSIS — J309 Allergic rhinitis, unspecified: Secondary | ICD-10-CM | POA: Diagnosis present

## 2017-05-31 DIAGNOSIS — K811 Chronic cholecystitis: Secondary | ICD-10-CM

## 2017-05-31 DIAGNOSIS — I959 Hypotension, unspecified: Secondary | ICD-10-CM | POA: Diagnosis present

## 2017-05-31 DIAGNOSIS — T82519A Breakdown (mechanical) of unspecified cardiac and vascular devices and implants, initial encounter: Secondary | ICD-10-CM | POA: Diagnosis not present

## 2017-05-31 DIAGNOSIS — I69354 Hemiplegia and hemiparesis following cerebral infarction affecting left non-dominant side: Secondary | ICD-10-CM | POA: Diagnosis not present

## 2017-05-31 DIAGNOSIS — R079 Chest pain, unspecified: Secondary | ICD-10-CM | POA: Diagnosis not present

## 2017-05-31 DIAGNOSIS — K9186 Retained cholelithiasis following cholecystectomy: Secondary | ICD-10-CM | POA: Diagnosis not present

## 2017-05-31 DIAGNOSIS — R410 Disorientation, unspecified: Secondary | ICD-10-CM | POA: Diagnosis not present

## 2017-05-31 DIAGNOSIS — R112 Nausea with vomiting, unspecified: Secondary | ICD-10-CM | POA: Diagnosis not present

## 2017-05-31 DIAGNOSIS — K5100 Ulcerative (chronic) pancolitis without complications: Secondary | ICD-10-CM

## 2017-05-31 DIAGNOSIS — K219 Gastro-esophageal reflux disease without esophagitis: Secondary | ICD-10-CM | POA: Diagnosis not present

## 2017-05-31 DIAGNOSIS — I5042 Chronic combined systolic (congestive) and diastolic (congestive) heart failure: Secondary | ICD-10-CM | POA: Diagnosis not present

## 2017-05-31 LAB — CBC WITH DIFFERENTIAL/PLATELET
Basophils Absolute: 0 10*3/uL (ref 0.0–0.1)
Basophils Relative: 0 %
Eosinophils Absolute: 0 10*3/uL (ref 0.0–0.7)
Eosinophils Relative: 0 %
HCT: 28.9 % — ABNORMAL LOW (ref 39.0–52.0)
Hemoglobin: 10 g/dL — ABNORMAL LOW (ref 13.0–17.0)
Lymphocytes Relative: 9 %
Lymphs Abs: 2 10*3/uL (ref 0.7–4.0)
MCH: 30.8 pg (ref 26.0–34.0)
MCHC: 34.6 g/dL (ref 30.0–36.0)
MCV: 88.9 fL (ref 78.0–100.0)
Monocytes Absolute: 1.5 10*3/uL — ABNORMAL HIGH (ref 0.1–1.0)
Monocytes Relative: 7 %
Neutro Abs: 18.5 10*3/uL — ABNORMAL HIGH (ref 1.7–7.7)
Neutrophils Relative %: 84 %
Platelets: 331 10*3/uL (ref 150–400)
RBC: 3.25 MIL/uL — ABNORMAL LOW (ref 4.22–5.81)
RDW: 15.2 % (ref 11.5–15.5)
WBC: 22 10*3/uL — ABNORMAL HIGH (ref 4.0–10.5)

## 2017-05-31 LAB — PROTIME-INR
INR: >10
Prothrombin Time: >90 seconds — ABNORMAL HIGH (ref 11.4–15.2)

## 2017-05-31 LAB — COMPREHENSIVE METABOLIC PANEL
ALT: 10 U/L — ABNORMAL LOW (ref 17–63)
AST: 13 U/L — ABNORMAL LOW (ref 15–41)
Albumin: 2.2 g/dL — ABNORMAL LOW (ref 3.5–5.0)
Alkaline Phosphatase: 89 U/L (ref 38–126)
Anion gap: 15 (ref 5–15)
BUN: 91 mg/dL — ABNORMAL HIGH (ref 6–20)
CO2: 18 mmol/L — ABNORMAL LOW (ref 22–32)
Calcium: 7.7 mg/dL — ABNORMAL LOW (ref 8.9–10.3)
Chloride: 100 mmol/L — ABNORMAL LOW (ref 101–111)
Creatinine, Ser: 5.85 mg/dL — ABNORMAL HIGH (ref 0.61–1.24)
GFR calc Af Amer: 9 mL/min — ABNORMAL LOW (ref >60)
GFR calc non Af Amer: 8 mL/min — ABNORMAL LOW (ref >60)
Glucose, Bld: 94 mg/dL (ref 65–99)
Potassium: 3.5 mmol/L (ref 3.5–5.1)
Sodium: 133 mmol/L — ABNORMAL LOW (ref 135–145)
Total Bilirubin: 0.6 mg/dL (ref 0.3–1.2)
Total Protein: 5.8 g/dL — ABNORMAL LOW (ref 6.5–8.1)

## 2017-05-31 LAB — I-STAT CG4 LACTIC ACID, ED: Lactic Acid, Venous: 0.72 mmol/L (ref 0.5–1.9)

## 2017-05-31 LAB — MRSA PCR SCREENING: MRSA by PCR: POSITIVE — AB

## 2017-05-31 LAB — C DIFFICILE QUICK SCREEN W PCR REFLEX
C Diff antigen: POSITIVE — AB
C Diff toxin: NEGATIVE

## 2017-05-31 LAB — I-STAT TROPONIN, ED: Troponin i, poc: 0.02 ng/mL (ref 0.00–0.08)

## 2017-05-31 LAB — LIPASE, BLOOD: Lipase: 27 U/L (ref 11–51)

## 2017-05-31 MED ORDER — SODIUM CHLORIDE 0.9 % IV BOLUS
1000.0000 mL | Freq: Once | INTRAVENOUS | Status: AC
  Administered 2017-05-31: 1000 mL via INTRAVENOUS

## 2017-05-31 MED ORDER — DEXTROSE 5 % IV SOLN
500.0000 mg | Freq: Once | INTRAVENOUS | Status: AC
  Administered 2017-05-31: 500 mg via INTRAVENOUS
  Filled 2017-05-31: qty 0.5

## 2017-05-31 MED ORDER — VITAMIN K1 10 MG/ML IJ SOLN
1.0000 mg | Freq: Once | INTRAVENOUS | Status: AC
  Administered 2017-05-31: 1 mg via INTRAVENOUS
  Filled 2017-05-31: qty 0.1

## 2017-05-31 MED ORDER — SODIUM CHLORIDE 0.9 % IV SOLN
Freq: Once | INTRAVENOUS | Status: AC
  Administered 2017-05-31: 14:00:00 via INTRAVENOUS

## 2017-05-31 MED ORDER — VANCOMYCIN HCL IN DEXTROSE 1-5 GM/200ML-% IV SOLN
1000.0000 mg | Freq: Once | INTRAVENOUS | Status: AC
  Administered 2017-05-31: 1000 mg via INTRAVENOUS
  Filled 2017-05-31: qty 200

## 2017-05-31 MED ORDER — METRONIDAZOLE IN NACL 5-0.79 MG/ML-% IV SOLN
500.0000 mg | Freq: Three times a day (TID) | INTRAVENOUS | Status: DC
  Administered 2017-05-31 (×2): 500 mg via INTRAVENOUS
  Filled 2017-05-31 (×3): qty 100

## 2017-05-31 MED ORDER — ACETAMINOPHEN 650 MG RE SUPP: 650.0000 mg | Freq: Four times a day (QID) | RECTAL | Status: DC | PRN

## 2017-05-31 MED ORDER — ACETAMINOPHEN 325 MG PO TABS: 650.0000 mg | ORAL_TABLET | Freq: Four times a day (QID) | ORAL | Status: DC | PRN

## 2017-05-31 MED ORDER — DEXTROSE 5 % IV SOLN: 500.0000 mg | INTRAVENOUS | Status: DC

## 2017-05-31 MED ORDER — ONDANSETRON HCL 4 MG PO TABS: 4.0000 mg | ORAL_TABLET | Freq: Four times a day (QID) | ORAL | Status: DC | PRN

## 2017-05-31 MED ORDER — ONDANSETRON HCL 4 MG/2ML IJ SOLN: 4.0000 mg | Freq: Four times a day (QID) | INTRAMUSCULAR | Status: DC | PRN

## 2017-05-31 MED ORDER — SODIUM CHLORIDE 0.9 % IV BOLUS
500.0000 mL | Freq: Once | INTRAVENOUS | Status: AC
  Administered 2017-05-31: 500 mL via INTRAVENOUS

## 2017-05-31 MED ORDER — SODIUM CHLORIDE 0.9 % IV SOLN
INTRAVENOUS | Status: DC
  Administered 2017-05-31 – 2017-06-03 (×6): via INTRAVENOUS

## 2017-05-31 NOTE — ED Triage Notes (Signed)
Pt to ED via [facility] from [facility].reports abnormal labs
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
## 2017-05-31 NOTE — Progress Notes (Signed)
[facility] for warfarin, cefepime, vancomycin Indication: atrial fibrillation and sepsis possible intra-abd infection  No Known Allergies  Patient Measurements:    Vital Signs: Temp: 97.1 F (36.2 C) (05/09 1059) Temp Source: Oral (05/09 1059) BP: 98/52 (05/09 1430) Pulse Rate: 96 (05/09 1430)  Labs: Recent Labs    05/31/17 1107 05/31/17 1319  HGB 10.0*  --   HCT 28.9*  --   PLT 331  --   LABPROT  --  >90.0*  INR  --  >10.00*  CREATININE 5.85*  --     Estimated Creatinine Clearance: 9.7 mL/min (A) (by C-G formula based on SCr of 5.85 mg/dL (H)).  Assessment: CC/HPI: 84 yo m presenting with diarrhea poor po intake, code sepsis  PMH: afib on warfarin, HTN, CKD3, CKD, prostate ca, hemiplegia  Anticoag: warfarin pta for afib - admit INR> 10 1 mg Vit k given  CHA2DS2/VAS Stroke Risk Points  Current as of 21 minutes ago     5 >= 2 Points: High Risk  1 - 1.99 Points: Medium Risk  0 Points: Low Risk    The patient's score has not changed in the past year.:  No Change     Details    This score determines the patient's risk of having a stroke if the  patient has atrial fibrillation.       Points Metrics  0 Has Congestive Heart Failure:  No    Current as of 21 minutes ago  0 Has Vascular Disease:  No    Current as of 21 minutes ago  1 Has Hypertension:  Yes    Current as of 21 minutes ago  2 Age:  40    Current as of 21 minutes ago  0 Has Diabetes:  No    Current as of 21 minutes ago  2 Had Stroke:  No  Had TIA:  No  Had thromboembolism:  Yes    Current as of 21 minutes ago  0 Male:  No    Current as of 21 minutes ago    ID: abx for sepsis/cdif/possible colitis.   Metronidazole 5/9>> Cefepime 5/9>> Vanc 5/9>>  Urine Cdif  Renal: SCr 5.85  Heme/Onc: H&H 10/28.9, Plt 331  Goal of Therapy:  INR 2-3 Monitor platelets by anticoagulation protocol: Yes   Plan:  Hold warfarin Daily INR Cefepime 500 mg q24h Vanc iv 1 g x  1 Flagyl per MD F/u further dosing of vanc based on plan Fri Monitor renal fx cx vt prn  Levester Fresh, PharmD, BCPS, BCCCP Clinical Pharmacist Clinical phone for 05/31/2017 from 1430 - 2300: [phone] If after 2300, please call main pharmacy at: [phone] 05/31/2017 4:53 PM

## 2017-06-01 ENCOUNTER — Encounter (HOSPITAL_COMMUNITY): Payer: Self-pay

## 2017-06-01 DIAGNOSIS — E43 Unspecified severe protein-calorie malnutrition: Secondary | ICD-10-CM

## 2017-06-01 DIAGNOSIS — A09 Infectious gastroenteritis and colitis, unspecified: Secondary | ICD-10-CM

## 2017-06-01 DIAGNOSIS — A498 Other bacterial infections of unspecified site: Secondary | ICD-10-CM

## 2017-06-01 LAB — URINALYSIS, ROUTINE W REFLEX MICROSCOPIC
Bilirubin Urine: NEGATIVE
Glucose, UA: NEGATIVE mg/dL
Ketones, ur: NEGATIVE mg/dL
Leukocytes, UA: NEGATIVE
Nitrite: NEGATIVE
Protein, ur: NEGATIVE mg/dL
Specific Gravity, Urine: 1.015 (ref 1.005–1.030)
pH: 5 (ref 5.0–8.0)

## 2017-06-01 LAB — BASIC METABOLIC PANEL
Anion gap: 14 (ref 5–15)
BUN: 82 mg/dL — ABNORMAL HIGH (ref 6–20)
CO2: 16 mmol/L — ABNORMAL LOW (ref 22–32)
Calcium: 7.8 mg/dL — ABNORMAL LOW (ref 8.9–10.3)
Chloride: 104 mmol/L (ref 101–111)
Creatinine, Ser: 4.56 mg/dL — ABNORMAL HIGH (ref 0.61–1.24)
GFR calc Af Amer: 13 mL/min — ABNORMAL LOW (ref >60)
GFR calc non Af Amer: 11 mL/min — ABNORMAL LOW (ref >60)
Glucose, Bld: 89 mg/dL (ref 65–99)
Potassium: 3.3 mmol/L — ABNORMAL LOW (ref 3.5–5.1)
Sodium: 134 mmol/L — ABNORMAL LOW (ref 135–145)

## 2017-06-01 LAB — CBC
HCT: 28 % — ABNORMAL LOW (ref 39.0–52.0)
Hemoglobin: 9.4 g/dL — ABNORMAL LOW (ref 13.0–17.0)
MCH: 29.8 pg (ref 26.0–34.0)
MCHC: 33.6 g/dL (ref 30.0–36.0)
MCV: 88.9 fL (ref 78.0–100.0)
Platelets: 295 10*3/uL (ref 150–400)
RBC: 3.15 MIL/uL — ABNORMAL LOW (ref 4.22–5.81)
RDW: 15 % (ref 11.5–15.5)
WBC: 18 10*3/uL — ABNORMAL HIGH (ref 4.0–10.5)

## 2017-06-01 LAB — CLOSTRIDIUM DIFFICILE BY PCR, REFLEXED: Toxigenic C. Difficile by PCR: POSITIVE — AB

## 2017-06-01 LAB — PROTIME-INR
INR: 7.81
Prothrombin Time: 65.3 seconds — ABNORMAL HIGH (ref 11.4–15.2)

## 2017-06-01 MED ORDER — SODIUM CHLORIDE 0.9% FLUSH: 10.0000 mL | INTRAVENOUS | Status: DC | PRN

## 2017-06-01 MED ORDER — VANCOMYCIN 50 MG/ML ORAL SOLUTION
500.0000 mg | Freq: Four times a day (QID) | ORAL | Status: DC
  Administered 2017-06-01 – 2017-06-03 (×9): 500 mg via ORAL
  Filled 2017-06-01 (×12): qty 10

## 2017-06-01 MED ORDER — PHYTONADIONE 5 MG PO TABS
10.0000 mg | ORAL_TABLET | Freq: Once | ORAL | Status: AC
  Administered 2017-06-01: 10 mg via ORAL
  Filled 2017-06-01: qty 2

## 2017-06-01 MED ORDER — SODIUM CHLORIDE 0.9% FLUSH
10.0000 mL | Freq: Two times a day (BID) | INTRAVENOUS | Status: DC
  Administered 2017-06-01 – 2017-06-03 (×4): 10 mL

## 2017-06-01 MED ORDER — BOOST PLUS PO LIQD
237.0000 mL | Freq: Two times a day (BID) | ORAL | Status: DC
  Administered 2017-06-01 – 2017-06-03 (×5): 237 mL via ORAL
  Filled 2017-06-01 (×8): qty 237

## 2017-06-01 MED ORDER — VANCOMYCIN 50 MG/ML ORAL SOLUTION
125.0000 mg | Freq: Four times a day (QID) | ORAL | Status: DC
  Administered 2017-06-01: 125 mg via ORAL
  Filled 2017-06-01 (×2): qty 2.5
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
## 2017-06-01 NOTE — Progress Notes (Signed)
CRITICAL VALUE ALERT  Critical Value:  PT 65.3 INR 7.81  Date & Time Notied:  06/01/17    1234  Provider Notified: A Adhikar  Orders Received/Actions taken:

## 2017-06-01 NOTE — Progress Notes (Signed)
ANTICOAGULATION CONSULT NOTE - Follow Up Consult  Pharmacy Consult for Coumadin Indication: atrial fibrillation  No Known Allergies  Patient Measurements: Height: 5' 6.5" (168.9 cm) Weight: 173 lb 1 oz (78.5 kg) IBW/kg (Calculated) : 64.95  Vital Signs: Temp: 99.6 F (37.6 C) (05/10 0829) Temp Source: Oral (05/10 0829) BP: 134/63 (05/10 0829) Pulse Rate: 92 (05/10 0829)  Labs: Recent Labs    05/31/17 1107 05/31/17 1319 06/01/17 0417 06/01/17 1101  HGB 10.0*  --  9.4*  --   HCT 28.9*  --  28.0*  --   PLT 331  --  295  --   LABPROT  --  >90.0*  --  65.3*  INR  --  >10.00*  --  7.81*  CREATININE 5.85*  --  4.56*  --     Estimated Creatinine Clearance: 12.4 mL/min (A) (by C-G formula based on SCr of 4.56 mg/dL (H)).   Medications:  Coumadin 5mg  daily, last dose 5/8  Assessment: 84 year old male admitted with Cdiff colitis. He is on chronic anticoagulation with Coumadin for atrial fibrillation. His INR was supratherapeutic on admission and remains supratherapeutic today.  Goal of Therapy:  INR 2-3 Monitor platelets by anticoagulation protocol: Yes   Plan:  No Coumadin today Daily PT/INR Monitor for bleeding complications  Legrand Como, Pharm.D., BCPS, BCIDP Clinical Pharmacist Phone: [phone] 06/01/2017, 2:09 PM

## 2017-06-01 NOTE — Consult Note (Signed)
   [facility] Inpatient Consult   06/01/2017  Austin [name] 1932-11-11 [id number]    Patient screened for potential [facility] Care Management program due to multiple hospitalizations and extreme risk of unplanned readmission score of 33%.  Chart review reveals Austin [name] is from [facility]. Also alerted of hospitalization by [place] Coordinator.   Went to bedside to speak with Austin [name]. Wife not present at the time. Austin [name] states he thinks his wife will be visiting soon. Made Austin [name] aware that writer will follow up later to speak with wife as well about [facility] Care Management follow up. [facility] Care Management brochure and contact information at bedside.    Marthenia Rolling, MSN-Ed, RN,BSN [facility] Liaison [phone]
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
## 2017-06-01 NOTE — Progress Notes (Addendum)
Initial Nutrition Assessment  DOCUMENTATION CODES:   Severe malnutrition in context of acute illness/injury  INTERVENTION:    Boost Plus po BID, each supplement provides 350 kcal and 20 grams of protein  NUTRITION DIAGNOSIS:   Severe Malnutrition related to acute illness(C. diff infection) as evidenced by energy intake < or equal to 50% for > or equal to 5 days, 11% weight loss x 1 month  GOAL:   Patient will meet greater than or equal to 90% of their needs  MONITOR:   PO intake, Supplement acceptance, Labs, Skin, Weight trends, I & O's  REASON FOR ASSESSMENT:   Malnutrition Screening Tool  ASSESSMENT:   84 yo Male with CKD stage III, atrial fibrillation, prostate CA who was recently admitted in 03/2017 for cholecystitis with sepsis and status post percutaneous cholecystostomy tube.  Patient presented to ED with nausea, vomiting, abdominal pain for the last 3 days.  Tachycardiac on arrival, WBCs 14.9, patient was admitted for further workup.  RD met with pt at bedside. He is from [facility]. Pt reports his appetite "comes and goes;" was poor 2 weeks PTA. He was "pretty much" only consuming liquids (water and tea).  Pt reveals no dysphagia with food but needs pills crushed. States he's lost 15 lbs within the 2 weeks period PTA. Per wt readings below, pt has lost 11% x 1 month.  Weight loss is severe for time frame. Labs reviewed. Na 134 (L). K 3.3 (L). Pt likes chocolate Boost Plus supplements.  NUTRITION - FOCUSED PHYSICAL EXAM:  Deferred at this time.  Diet Order:   Diet Order           Diet Heart Room service appropriate? Yes; Fluid consistency: Thin  Diet effective now         EDUCATION NEEDS:   No education needs have been identified at this time  Skin:  Skin Assessment: Reviewed RN Assessment  Last BM:  5/9  Height:   Ht Readings from Last 1 Encounters:  05/31/17 5' 6.5" (1.689 m)   Weight:   Wt Readings from Last 1 Encounters:   05/31/17 173 lb 1 oz (78.5 kg)   Wt Readings from Last 10 Encounters:  05/31/17 173 lb 1 oz (78.5 kg)  05/24/17 175 lb (79.4 kg)  05/11/17 179 lb 0.2 oz (81.2 kg)  04/27/17 195 lb 8.8 oz (88.7 kg)  02/26/17 200 lb (90.7 kg)  02/01/17 182 lb (82.6 kg)  01/11/17 197 lb (89.4 kg)  11/22/16 194 lb (88 kg)  07/29/16 195 lb 14.4 oz (88.9 kg)  04/19/16 189 lb 9.6 oz (86 kg)   Ideal Body Weight:  64.5 kg  BMI:  Body mass index is 27.51 kg/m.  Estimated Nutritional Needs:   Kcal:  2000-2200  Protein:  100-110 gm  Fluid:  2.0-2.2 L  Arthur Holms, RD, LDN Pager #: [phone] After-Hours Pager #: [phone]

## 2017-06-02 ENCOUNTER — Encounter (HOSPITAL_COMMUNITY): Payer: Self-pay

## 2017-06-02 LAB — CBC
HCT: 27.8 % — ABNORMAL LOW (ref 39.0–52.0)
Hemoglobin: 9.4 g/dL — ABNORMAL LOW (ref 13.0–17.0)
MCH: 30.2 pg (ref 26.0–34.0)
MCHC: 33.8 g/dL (ref 30.0–36.0)
MCV: 89.4 fL (ref 78.0–100.0)
Platelets: 319 10*3/uL (ref 150–400)
RBC: 3.11 MIL/uL — ABNORMAL LOW (ref 4.22–5.81)
RDW: 15.4 % (ref 11.5–15.5)
WBC: 13.8 10*3/uL — ABNORMAL HIGH (ref 4.0–10.5)

## 2017-06-02 LAB — BASIC METABOLIC PANEL
Anion gap: 11 (ref 5–15)
BUN: 66 mg/dL — ABNORMAL HIGH (ref 6–20)
CO2: 18 mmol/L — ABNORMAL LOW (ref 22–32)
Calcium: 8.2 mg/dL — ABNORMAL LOW (ref 8.9–10.3)
Chloride: 107 mmol/L (ref 101–111)
Creatinine, Ser: 3.15 mg/dL — ABNORMAL HIGH (ref 0.61–1.24)
GFR calc Af Amer: 20 mL/min — ABNORMAL LOW (ref >60)
GFR calc non Af Amer: 17 mL/min — ABNORMAL LOW (ref >60)
Glucose, Bld: 83 mg/dL (ref 65–99)
Potassium: 3.2 mmol/L — ABNORMAL LOW (ref 3.5–5.1)
Sodium: 136 mmol/L (ref 135–145)

## 2017-06-02 LAB — PROTIME-INR
INR: 7.67
Prothrombin Time: 64.4 seconds — ABNORMAL HIGH (ref 11.4–15.2)

## 2017-06-02 MED ORDER — SACCHAROMYCES BOULARDII 250 MG PO CAPS
250.0000 mg | ORAL_CAPSULE | Freq: Two times a day (BID) | ORAL | Status: DC
  Administered 2017-06-02 – 2017-06-03 (×3): 250 mg via ORAL
  Filled 2017-06-02 (×3): qty 1

## 2017-06-02 MED ORDER — POTASSIUM CHLORIDE CRYS ER 20 MEQ PO TBCR
40.0000 meq | EXTENDED_RELEASE_TABLET | Freq: Once | ORAL | Status: AC
  Administered 2017-06-02: 40 meq via ORAL
  Filled 2017-06-02: qty 2

## 2017-06-02 MED ORDER — VITAMIN K1 10 MG/ML IJ SOLN
5.0000 mg | Freq: Once | INTRAVENOUS | Status: AC
  Administered 2017-06-02: 5 mg via INTRAVENOUS
  Filled 2017-06-02: qty 0.5

## 2017-06-02 NOTE — Progress Notes (Signed)
CSW following for discharge back to SNF. Per chart review, patient from [facility] for rehab; most recent assessment completed on 05/10/17 (see below). CSW will continue to follow.  Laveda Abbe, [place] Clinical Social Worker [phone]       Clinical Social Work Assessment  Patient Details  Name: Austin [name] MRN: [id number] Date of Birth: 02-07-1932  Date of referral:  05/10/17               Reason for consult:  Facility Placement, Discharge Planning                    Permission sought to share information with:  Family Supports, Customer service manager Permission granted to share information::                Name::     Austin [name]             Agency::  [facility]             Relationship::  wife             Contact Information:  [phone]  Housing/Transportation Living arrangements for the past 2 months:  [place] of Information:  Patient, Spouse Patient Interpreter Needed:  None Criminal Activity/Legal Involvement Pertinent to Current Situation/Hospitalization:  No - Comment as needed Significant Relationships:  Adult Children, Spouse Lives with:  Self, Spouse Do you feel safe going back to the place where you live?  Yes Need for family participation in patient care:  Yes (Comment)  Care giving concerns:  Patients spouse at bedside. Patient came from [facility] and he has been at facility receiving short term rehab. Patient spouse Austin [name] stated patient will return back to facility   Social Worker assessment / plan:  CSW met family at bedside. Patient was engaged during assessment but let his wife do the talking. Austin [name] is very supportive of patient and does want patient to return to [facility] CSW will follow up with facility for bed placement.  Employment status:  Retired Forensic scientist:  [employer] PT Recommendations:  [place] / Referral to community resources:  [place]  Patient/Family's Response to care:  Patient appreciate CSW role in care  Patient/Family's Understanding of and Emotional Response to Diagnosis, Current Treatment, and Prognosis:  Spouse wanting patient to go back to rehab to get strength back up   Emotional Assessment Appearance:  Appears older than stated age Attitude/Demeanor/Rapport:  Engaged Affect (typically observed):  Accepting Orientation:  Oriented to Situation, Oriented to Self, Oriented to Place, Oriented to  Time Alcohol / Substance use:  Not Applicable Psych involvement (Current and /or in the community):  No (Comment)  Discharge Needs  Concerns to be addressed:  No discharge needs identified Readmission within the last 30 days:  No Current discharge risk:  Dependent with Mobility Barriers to Discharge:  Continued Medical Work up   [employer], LCSW 05/10/2017, 2:32 PM
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
## 2017-06-02 NOTE — Progress Notes (Signed)
Subjective: No diarrhea today. Feels much better.  Objective: Vital signs in last 24 hours: Temp:  [97.3 F (36.3 C)-98 F (36.7 C)] 97.3 F (36.3 C) (05/11 0800) Pulse Rate:  [89-98] 90 (05/11 0800) Resp:  [18-20] 20 (05/11 0800) BP: (119-144)/(59-69) 119/59 (05/11 0800) SpO2:  [97 %-100 %] 99 % (05/11 0800) Weight change:  Last BM Date: 05/31/17  PE: GEN:  Younger-appearing than stated age, NAD ABD:  Soft, non-tender  Lab Results: CBC    Component Value Date/Time   WBC 13.8 (H) 06/02/2017 0545   RBC 3.11 (L) 06/02/2017 0545   HGB 9.4 (L) 06/02/2017 0545   HCT 27.8 (L) 06/02/2017 0545   PLT 319 06/02/2017 0545   MCV 89.4 06/02/2017 0545   MCH 30.2 06/02/2017 0545   MCHC 33.8 06/02/2017 0545   RDW 15.4 06/02/2017 0545   LYMPHSABS 2.0 05/31/2017 1107   MONOABS 1.5 (H) 05/31/2017 1107   EOSABS 0.0 05/31/2017 1107   BASOSABS 0.0 05/31/2017 1107    Assessment:  1.  Urosepsis, recent. 2.  Cholecystitis, recent. 3.  C. Diff, with hypotension and leukocytosis (improving), in face of antibiotics for #1 and #2 above, improving with high-dose vancomycin.  Plan:  1.  Vancomycin 500 mg PO QID for 10-day total. 2.  Florastor 250 mg po bid x 4 weeks, then d/c. 3.  Advance diet slowly as tolerated. 4.  [facility] will sign-off; please call with questions; thank you for the consultation.   Landry Dyke 06/02/2017, 10:30 AM   Cell [phone] If no answer or after 5 PM call [phone]

## 2017-06-02 NOTE — Progress Notes (Addendum)
ANTICOAGULATION CONSULT NOTE - Follow Up Consult  Pharmacy Consult for Coumadin Indication: atrial fibrillation  No Known Allergies  Patient Measurements: Height: 5' 6.5" (168.9 cm) Weight: 173 lb 1 oz (78.5 kg) IBW/kg (Calculated) : 64.95  Vital Signs: Temp: 97.3 F (36.3 C) (05/11 0356) Temp Source: Oral (05/11 0356) BP: 136/64 (05/11 0356) Pulse Rate: 89 (05/11 0356)  Labs: Recent Labs    05/31/17 1107 05/31/17 1319 06/01/17 0417 06/01/17 1101 06/02/17 0545  HGB 10.0*  --  9.4*  --  9.4*  HCT 28.9*  --  28.0*  --  27.8*  PLT 331  --  295  --  319  LABPROT  --  >90.0*  --  65.3* 64.4*  INR  --  >10.00*  --  7.81* 7.67*  CREATININE 5.85*  --  4.56*  --  3.15*    Estimated Creatinine Clearance: 18 mL/min (A) (by C-G formula based on SCr of 3.15 mg/dL (H)).   Medications:  Coumadin 5mg  daily, last dose 5/8  Assessment: 84 year old male admitted with Cdiff colitis. He is on chronic anticoagulation with Coumadin for atrial fibrillation. His INR was supratherapeutic on admission and remains supratherapeutic today. Patient received 10 mg PO vitamin K yesterday and is getting a 5 mg IV dose today. Hgb/plts stable.  Goal of Therapy:  INR 2-3 Monitor platelets by anticoagulation protocol: Yes   Plan:  No Coumadin today Daily PT/INR Monitor for bleeding complications  Angus Seller, PharmD Pharmacy Resident Clinical Phone for 06/02/2017 until 3:30pm: [phone] If after 3:30pm, please call main pharmacy at [phone] 06/02/2017 7:21 AM

## 2017-06-03 ENCOUNTER — Encounter (HOSPITAL_COMMUNITY): Payer: Self-pay | Admitting: *Deleted

## 2017-06-03 DIAGNOSIS — I129 Hypertensive chronic kidney disease with stage 1 through stage 4 chronic kidney disease, or unspecified chronic kidney disease: Secondary | ICD-10-CM | POA: Diagnosis not present

## 2017-06-03 DIAGNOSIS — I482 Chronic atrial fibrillation: Secondary | ICD-10-CM | POA: Diagnosis not present

## 2017-06-03 DIAGNOSIS — M6281 Muscle weakness (generalized): Secondary | ICD-10-CM | POA: Diagnosis not present

## 2017-06-03 DIAGNOSIS — R112 Nausea with vomiting, unspecified: Secondary | ICD-10-CM | POA: Diagnosis not present

## 2017-06-03 DIAGNOSIS — Z01818 Encounter for other preprocedural examination: Secondary | ICD-10-CM | POA: Diagnosis not present

## 2017-06-03 DIAGNOSIS — G8114 Spastic hemiplegia affecting left nondominant side: Secondary | ICD-10-CM | POA: Diagnosis not present

## 2017-06-03 DIAGNOSIS — I5042 Chronic combined systolic (congestive) and diastolic (congestive) heart failure: Secondary | ICD-10-CM | POA: Diagnosis not present

## 2017-06-03 DIAGNOSIS — R410 Disorientation, unspecified: Secondary | ICD-10-CM | POA: Diagnosis not present

## 2017-06-03 DIAGNOSIS — R2681 Unsteadiness on feet: Secondary | ICD-10-CM | POA: Diagnosis not present

## 2017-06-03 DIAGNOSIS — R7881 Bacteremia: Secondary | ICD-10-CM | POA: Diagnosis not present

## 2017-06-03 DIAGNOSIS — Z7901 Long term (current) use of anticoagulants: Secondary | ICD-10-CM | POA: Diagnosis not present

## 2017-06-03 DIAGNOSIS — K9186 Retained cholelithiasis following cholecystectomy: Secondary | ICD-10-CM | POA: Diagnosis not present

## 2017-06-03 DIAGNOSIS — S72065D Nondisplaced articular fracture of head of left femur, subsequent encounter for closed fracture with routine healing: Secondary | ICD-10-CM | POA: Diagnosis not present

## 2017-06-03 DIAGNOSIS — I69354 Hemiplegia and hemiparesis following cerebral infarction affecting left non-dominant side: Secondary | ICD-10-CM | POA: Diagnosis not present

## 2017-06-03 DIAGNOSIS — K811 Chronic cholecystitis: Secondary | ICD-10-CM | POA: Diagnosis not present

## 2017-06-03 DIAGNOSIS — G9009 Other idiopathic peripheral autonomic neuropathy: Secondary | ICD-10-CM | POA: Diagnosis not present

## 2017-06-03 DIAGNOSIS — K810 Acute cholecystitis: Secondary | ICD-10-CM | POA: Diagnosis not present

## 2017-06-03 DIAGNOSIS — N179 Acute kidney failure, unspecified: Secondary | ICD-10-CM | POA: Diagnosis not present

## 2017-06-03 DIAGNOSIS — M79605 Pain in left leg: Secondary | ICD-10-CM | POA: Diagnosis not present

## 2017-06-03 DIAGNOSIS — R279 Unspecified lack of coordination: Secondary | ICD-10-CM | POA: Diagnosis not present

## 2017-06-03 DIAGNOSIS — A0472 Enterocolitis due to Clostridium difficile, not specified as recurrent: Secondary | ICD-10-CM | POA: Diagnosis not present

## 2017-06-03 DIAGNOSIS — K219 Gastro-esophageal reflux disease without esophagitis: Secondary | ICD-10-CM | POA: Diagnosis not present

## 2017-06-03 DIAGNOSIS — I4891 Unspecified atrial fibrillation: Secondary | ICD-10-CM | POA: Diagnosis not present

## 2017-06-03 DIAGNOSIS — Z8673 Personal history of transient ischemic attack (TIA), and cerebral infarction without residual deficits: Secondary | ICD-10-CM | POA: Diagnosis not present

## 2017-06-03 DIAGNOSIS — I10 Essential (primary) hypertension: Secondary | ICD-10-CM | POA: Diagnosis not present

## 2017-06-03 DIAGNOSIS — N183 Chronic kidney disease, stage 3 (moderate): Secondary | ICD-10-CM | POA: Diagnosis not present

## 2017-06-03 DIAGNOSIS — J9602 Acute respiratory failure with hypercapnia: Secondary | ICD-10-CM | POA: Diagnosis not present

## 2017-06-03 DIAGNOSIS — E43 Unspecified severe protein-calorie malnutrition: Secondary | ICD-10-CM | POA: Diagnosis not present

## 2017-06-03 DIAGNOSIS — E785 Hyperlipidemia, unspecified: Secondary | ICD-10-CM | POA: Diagnosis not present

## 2017-06-03 DIAGNOSIS — R5381 Other malaise: Secondary | ICD-10-CM | POA: Diagnosis not present

## 2017-06-03 DIAGNOSIS — Z8249 Family history of ischemic heart disease and other diseases of the circulatory system: Secondary | ICD-10-CM | POA: Diagnosis not present

## 2017-06-03 DIAGNOSIS — R197 Diarrhea, unspecified: Secondary | ICD-10-CM | POA: Diagnosis not present

## 2017-06-03 DIAGNOSIS — R262 Difficulty in walking, not elsewhere classified: Secondary | ICD-10-CM | POA: Diagnosis not present

## 2017-06-03 DIAGNOSIS — R310 Gross hematuria: Secondary | ICD-10-CM | POA: Diagnosis not present

## 2017-06-03 DIAGNOSIS — Z743 Need for continuous supervision: Secondary | ICD-10-CM | POA: Diagnosis not present

## 2017-06-03 DIAGNOSIS — K5100 Ulcerative (chronic) pancolitis without complications: Secondary | ICD-10-CM | POA: Diagnosis not present

## 2017-06-03 DIAGNOSIS — D649 Anemia, unspecified: Secondary | ICD-10-CM | POA: Diagnosis not present

## 2017-06-03 LAB — BASIC METABOLIC PANEL
Anion gap: 9 (ref 5–15)
BUN: 50 mg/dL — ABNORMAL HIGH (ref 6–20)
CO2: 18 mmol/L — ABNORMAL LOW (ref 22–32)
Calcium: 7.9 mg/dL — ABNORMAL LOW (ref 8.9–10.3)
Chloride: 112 mmol/L — ABNORMAL HIGH (ref 101–111)
Creatinine, Ser: 2.24 mg/dL — ABNORMAL HIGH (ref 0.61–1.24)
GFR calc Af Amer: 30 mL/min — ABNORMAL LOW (ref >60)
GFR calc non Af Amer: 26 mL/min — ABNORMAL LOW (ref >60)
Glucose, Bld: 144 mg/dL — ABNORMAL HIGH (ref 65–99)
Potassium: 3.6 mmol/L (ref 3.5–5.1)
Sodium: 139 mmol/L (ref 135–145)

## 2017-06-03 LAB — PROTIME-INR
INR: 1.76
Prothrombin Time: 20.4 seconds — ABNORMAL HIGH (ref 11.4–15.2)

## 2017-06-03 MED ORDER — WARFARIN SODIUM 2.5 MG PO TABS: 2.5000 mg | ORAL_TABLET | Freq: Once | ORAL | Status: DC

## 2017-06-03 MED ORDER — VANCOMYCIN 50 MG/ML ORAL SOLUTION: 500.0000 mg | Freq: Four times a day (QID) | ORAL | 0 refills | Status: AC

## 2017-06-03 MED ORDER — WARFARIN - PHARMACIST DOSING INPATIENT: Freq: Every day | Status: DC

## 2017-06-03 MED ORDER — SACCHAROMYCES BOULARDII 250 MG PO CAPS: 250.0000 mg | ORAL_CAPSULE | Freq: Two times a day (BID) | ORAL | 0 refills | Status: AC
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
## 2017-06-03 NOTE — Progress Notes (Signed)
ANTICOAGULATION CONSULT NOTE - Follow Up Consult  Pharmacy Consult for Coumadin Indication: atrial fibrillation  No Known Allergies  Patient Measurements: Height: 5' 6.5" (168.9 cm) Weight: 173 lb 1 oz (78.5 kg) IBW/kg (Calculated) : 64.95  Vital Signs: Temp: 98 F (36.7 C) (05/12 0021) Temp Source: Oral (05/12 0021) BP: 131/65 (05/12 0021) Pulse Rate: 91 (05/12 0021)  Labs: Recent Labs    05/31/17 1107  06/01/17 0417 06/01/17 1101 06/02/17 0545 06/03/17 0229  HGB 10.0*  --  9.4*  --  9.4*  --   HCT 28.9*  --  28.0*  --  27.8*  --   PLT 331  --  295  --  319  --   LABPROT  --    < >  --  65.3* 64.4* 20.4*  INR  --    < >  --  7.81* 7.67* 1.76  CREATININE 5.85*  --  4.56*  --  3.15* 2.24*   < > = values in this interval not displayed.    Estimated Creatinine Clearance: 25.3 mL/min (A) (by C-G formula based on SCr of 2.24 mg/dL (H)).   Medications:  Coumadin 5mg  daily, last dose 5/8  Assessment: 84 year old male admitted with Cdiff colitis. He is on chronic anticoagulation with Coumadin for atrial fibrillation. His INR was supratherapeutic on admission (INR > 10). Patient received 10 mg PO vitamin K 5/10 and 5 mg IV dose 5/11. Hgb/plts stable. INR subtherapeutic at 1.76 today.  Goal of Therapy:  INR 2-3 Monitor platelets by anticoagulation protocol: Yes   Plan:  Coumadin 2.5 mg tonight Daily PT/INR Monitor for bleeding complications  Angus Seller, PharmD Pharmacy Resident Clinical Phone for 06/03/2017 until 3:30pm: [phone] If after 3:30pm, please call main pharmacy at [phone] 06/03/2017 8:44 AM

## 2017-06-03 NOTE — Progress Notes (Signed)
Discharge to: [facility] Anticipated discharge date: 06/03/17 Transportation by: [facility]  Report #: [phone]  [place] signing off.  Laveda Abbe LCSW [phone]

## 2017-06-03 NOTE — Progress Notes (Signed)
06/03/2017 [facility] arrived at 62 and RN reported she was unable to give report to nurse receiving patient. The facility was called at 1625 and noone answer the phone. At [place] facility was called and receptionist transfer the call and Rn continue to receive no response from the unit at the facilitty. [facility] RN.

## 2017-06-03 NOTE — Progress Notes (Signed)
Physical Therapy Evaluation Patient Details Name: Austin [name] MRN: [id number] DOB: 10-30-1932 Today's Date: 06/03/2017   History of Present Illness  [name] an 84 y/o male with Leukoxytosis. He comes form a facility where he reportas he was recieving therapy.  PMH He presents with hypertension, CKD, and Left sdied paralysis   Clinical Impression  Patient required max a for bed mobility and mod a to transfer to a chair. He  Reports he was able to transfer better prior to admission. He would benefit from continued skilled acute therapy as well as therapy when he returns to his SNF.    Follow Up Recommendations SNF    Equipment Recommendations  None recommended by PT    Recommendations for Other Services       Precautions / Restrictions Precautions Precautions: Fall Precaution Comments: L hand contracture with palm protector Restrictions Weight Bearing Restrictions: No      Mobility  Bed Mobility Overal bed mobility: Needs Assistance Bed Mobility: Rolling;Sidelying to Sit Rolling: Max assist         General bed mobility comments: able to use his right UE to pull his hips around but max a to sit up at the edge of bed. Once sitting Min gaurd to remain sitting   Transfers Overall transfer level: Needs assistance   Transfers: Stand Pivot Transfers   Stand pivot transfers: Mod assist       General transfer comment: able to use right leg to assit with transfer but required mod a to pivot to a chair  Ambulation/Gait                Stairs            Wheelchair Mobility    Modified Rankin (Stroke Patients Only)       Balance Overall balance assessment: Needs assistance Sitting-balance support: Feet supported;Single extremity supported Sitting balance-Leahy Scale: Poor Sitting balance - Comments: pt performed sitting balance with reaching tasks with pt requiring min A for sitting balance without UE support. pt prefers Rt UE support for sitting  balance. pt able to reach all directions and promote trunk rotation with min A and manual facilitation Postural control: Right lateral lean   Standing balance-Leahy Scale: Poor                               Pertinent Vitals/Pain Pain Assessment: No/denies pain    Home Living Family/patient expects to be discharged to:: Skilled nursing facility                 Additional Comments: reports he was recieving PT at his facility     Prior Function Level of Independence: Needs assistance   Gait / Transfers Assistance Needed: lifted with hoyer, was working on sitting balance and core strength  ADL's / Homemaking Assistance Needed: self feeds and participates in grooming, otherwise dependent  Comments: Per patient he was walking short distances at his SNF but recent notes suggest he was working on basic transfers      Hand Dominance   Dominant Hand: Right    Extremity/Trunk Assessment   Upper Extremity Assessment Upper Extremity Assessment: Defer to OT evaluation    Lower Extremity Assessment LLE Deficits / Details: Increased extensor tone noted in LLE. Unable to break at hip in sitting secondary to increased soreness with deep pressure. Able to break at knee with deep pressure.     Cervical / Trunk Assessment Cervical /  Trunk Assessment: Other exceptions Cervical / Trunk Exceptions: extensor tone in trunk, weakness  Communication   Communication: No difficulties  Cognition Arousal/Alertness: Awake/alert Behavior During Therapy: Flat affect Overall Cognitive Status: History of cognitive impairments - at baseline                                        General Comments      Exercises     Assessment/Plan    PT Assessment Patient needs continued PT services  PT Problem List Decreased balance;Decreased mobility;Decreased knowledge of use of DME;Decreased safety awareness;Decreased knowledge of precautions;Cardiopulmonary status limiting  activity;Pain;Decreased strength;Decreased range of motion       PT Treatment Interventions DME instruction;Gait training;Functional mobility training;Therapeutic activities;Therapeutic exercise;Balance training;Patient/family education;Wheelchair mobility training;Neuromuscular re-education    PT Goals (Current goals can be found in the Care Plan section)  Acute Rehab PT Goals Patient Stated Goal: to get better  PT Goal Formulation: With patient Time For Goal Achievement: 05/23/17 Potential to Achieve Goals: Fair    Frequency Min 2X/week   Barriers to discharge        Co-evaluation               AM-PAC PT "6 Clicks" Daily Activity  Outcome Measure Difficulty turning over in bed (including adjusting bedclothes, sheets and blankets)?: A Lot Difficulty moving from lying on back to sitting on the side of the bed? : A Lot Difficulty sitting down on and standing up from a chair with arms (e.g., wheelchair, bedside commode, etc,.)?: Unable Help needed moving to and from a bed to chair (including a wheelchair)?: Total Help needed walking in hospital room?: Total Help needed climbing 3-5 steps with a railing? : Total 6 Click Score: 8    End of Session Equipment Utilized During Treatment: Gait belt Activity Tolerance: Patient tolerated treatment well Patient left: with call bell/phone within reach;in chair Nurse Communication: Mobility status;Need for lift equipment PT Visit Diagnosis: Unsteadiness on feet (R26.81);Muscle weakness (generalized) (M62.81);Other symptoms and signs involving the nervous system (R29.898);Hemiplegia and hemiparesis Hemiplegia - Right/Left: Left    Time: 5809-9833 PT Time Calculation (min) (ACUTE ONLY): 17 min   Charges:   PT Evaluation $PT Eval Moderate Complexity: 1 Mod     PT G Codes:         Carney Living PT DPT  06/03/2017, 12:40 PM

## 2017-06-04 ENCOUNTER — Telehealth: Payer: Self-pay | Admitting: *Deleted

## 2017-06-04 DIAGNOSIS — K810 Acute cholecystitis: Secondary | ICD-10-CM | POA: Diagnosis not present

## 2017-06-04 DIAGNOSIS — A0472 Enterocolitis due to Clostridium difficile, not specified as recurrent: Secondary | ICD-10-CM | POA: Diagnosis not present

## 2017-06-04 DIAGNOSIS — I4891 Unspecified atrial fibrillation: Secondary | ICD-10-CM | POA: Insufficient documentation

## 2017-06-04 DIAGNOSIS — R197 Diarrhea, unspecified: Secondary | ICD-10-CM | POA: Diagnosis not present

## 2017-06-04 DIAGNOSIS — I480 Paroxysmal atrial fibrillation: Secondary | ICD-10-CM | POA: Insufficient documentation

## 2017-06-04 NOTE — Telephone Encounter (Signed)
Pt was on TCM report admitted 05/31/17 for persistent abdominal discomfort, loose stools, nauseas. Pt had severe acute renal failure on CKD. C-diff positive. Pt D/C 06/03/17, and sent to SNF. Per summary will need to see pcp in 1 week after discharge.Marland KitchenJohny Chess

## 2017-06-05 DIAGNOSIS — I482 Chronic atrial fibrillation: Secondary | ICD-10-CM | POA: Diagnosis not present

## 2017-06-05 DIAGNOSIS — N183 Chronic kidney disease, stage 3 (moderate): Secondary | ICD-10-CM | POA: Diagnosis not present

## 2017-06-05 DIAGNOSIS — I10 Essential (primary) hypertension: Secondary | ICD-10-CM | POA: Diagnosis not present

## 2017-06-05 DIAGNOSIS — K5100 Ulcerative (chronic) pancolitis without complications: Secondary | ICD-10-CM | POA: Diagnosis not present

## 2017-06-06 ENCOUNTER — Other Ambulatory Visit: Payer: Medicare Other

## 2017-06-07 DIAGNOSIS — R262 Difficulty in walking, not elsewhere classified: Secondary | ICD-10-CM | POA: Diagnosis not present

## 2017-06-07 DIAGNOSIS — R5381 Other malaise: Secondary | ICD-10-CM | POA: Diagnosis not present

## 2017-06-07 DIAGNOSIS — M79605 Pain in left leg: Secondary | ICD-10-CM | POA: Diagnosis not present

## 2017-06-07 DIAGNOSIS — M6281 Muscle weakness (generalized): Secondary | ICD-10-CM | POA: Diagnosis not present

## 2017-06-08 LAB — URINE CULTURE: Culture: 10000 — AB

## 2017-06-12 ENCOUNTER — Ambulatory Visit (INDEPENDENT_AMBULATORY_CARE_PROVIDER_SITE_OTHER): Payer: Medicare Other | Admitting: Cardiology

## 2017-06-12 ENCOUNTER — Encounter: Payer: Self-pay | Admitting: *Deleted

## 2017-06-12 ENCOUNTER — Encounter: Payer: Self-pay | Admitting: Cardiology

## 2017-06-12 VITALS — BP 130/60 | HR 76 | Ht 66.5 in | Wt 180.0 lb

## 2017-06-12 DIAGNOSIS — K811 Chronic cholecystitis: Secondary | ICD-10-CM

## 2017-06-12 DIAGNOSIS — I4891 Unspecified atrial fibrillation: Secondary | ICD-10-CM | POA: Diagnosis not present

## 2017-06-12 DIAGNOSIS — I10 Essential (primary) hypertension: Secondary | ICD-10-CM | POA: Diagnosis not present

## 2017-06-12 DIAGNOSIS — R262 Difficulty in walking, not elsewhere classified: Secondary | ICD-10-CM | POA: Diagnosis not present

## 2017-06-12 DIAGNOSIS — Z01818 Encounter for other preprocedural examination: Secondary | ICD-10-CM

## 2017-06-12 DIAGNOSIS — M79605 Pain in left leg: Secondary | ICD-10-CM | POA: Diagnosis not present

## 2017-06-12 DIAGNOSIS — N183 Chronic kidney disease, stage 3 unspecified: Secondary | ICD-10-CM

## 2017-06-12 DIAGNOSIS — M6281 Muscle weakness (generalized): Secondary | ICD-10-CM | POA: Diagnosis not present

## 2017-06-12 DIAGNOSIS — R5381 Other malaise: Secondary | ICD-10-CM | POA: Diagnosis not present

## 2017-06-12 NOTE — Patient Instructions (Signed)
Medication Instructions:  Your physician recommends that you continue on your current medications as directed. Please refer to the Current Medication list given to you today.   Labwork: None  Testing/Procedures: Your physician has requested that you have a carotid duplex. This test is an ultrasound of the carotid arteries in your neck. It looks at blood flow through these arteries that supply the brain with blood. Allow one hour for this exam. There are no restrictions or special instructions.  Your physician has requested that you have a lexiscan myoview. For further information please visit [email or web address]. Please follow instruction sheet, as given.   Follow-Up: Your physician recommends that you schedule a follow-up appointment in: 2 months.  If you need a refill on your cardiac medications before your next appointment, please call your pharmacy.   Thank you for choosing [facility]! Robyne Peers, RN [phone]
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
## 2017-06-14 ENCOUNTER — Ambulatory Visit (INDEPENDENT_AMBULATORY_CARE_PROVIDER_SITE_OTHER): Payer: Medicare Other | Admitting: Internal Medicine

## 2017-06-14 ENCOUNTER — Encounter: Payer: Self-pay | Admitting: Internal Medicine

## 2017-06-14 VITALS — BP 120/74 | HR 95 | Temp 98.5°F | Ht 66.5 in

## 2017-06-14 DIAGNOSIS — N183 Chronic kidney disease, stage 3 unspecified: Secondary | ICD-10-CM

## 2017-06-14 DIAGNOSIS — I10 Essential (primary) hypertension: Secondary | ICD-10-CM

## 2017-06-14 DIAGNOSIS — G8114 Spastic hemiplegia affecting left nondominant side: Secondary | ICD-10-CM | POA: Diagnosis not present

## 2017-06-14 NOTE — Assessment & Plan Note (Signed)
Ok for left arm sling for daytime use, o/w stable overall by history and exam, and pt to continue medical treatment as before,  to f/u any worsening symptoms or concerns

## 2017-06-14 NOTE — Assessment & Plan Note (Signed)
BP Readings from Last 3 Encounters:  06/14/17 120/74  06/12/17 130/60  06/03/17 130/67  stable overall by history and exam, recent data reviewed with pt, and pt to continue medical treatment as before,  to f/u any worsening symptoms or concerns

## 2017-06-14 NOTE — Assessment & Plan Note (Signed)
With recent AKI now back to baseline,  to f/u any worsening symptoms or concerns
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
## 2017-06-14 NOTE — Patient Instructions (Signed)
[facility] for order for left arm sling  Please continue all other medications as before, and refills have been done if requested.  Please have the pharmacy call with any other refills you may need.  Please continue your efforts at being more active, low cholesterol diet, and weight control.  Please keep your appointments with your specialists as you may have planned - Cardiology after testing, and General Surgury  Please return in 6 months, or sooner if needed

## 2017-06-15 DIAGNOSIS — M79605 Pain in left leg: Secondary | ICD-10-CM | POA: Diagnosis not present

## 2017-06-15 DIAGNOSIS — N183 Chronic kidney disease, stage 3 (moderate): Secondary | ICD-10-CM | POA: Diagnosis not present

## 2017-06-15 DIAGNOSIS — K810 Acute cholecystitis: Secondary | ICD-10-CM | POA: Diagnosis not present

## 2017-06-15 DIAGNOSIS — I4891 Unspecified atrial fibrillation: Secondary | ICD-10-CM | POA: Diagnosis not present

## 2017-06-15 DIAGNOSIS — M6281 Muscle weakness (generalized): Secondary | ICD-10-CM | POA: Diagnosis not present

## 2017-06-27 ENCOUNTER — Telehealth (HOSPITAL_COMMUNITY): Payer: Self-pay | Admitting: *Deleted

## 2017-06-27 NOTE — Telephone Encounter (Signed)
Attempted to call patient regarding nuclear stress test to give instructions- no answer, unable to leave message.  Austin [name]

## 2017-06-28 ENCOUNTER — Ambulatory Visit (HOSPITAL_BASED_OUTPATIENT_CLINIC_OR_DEPARTMENT_OTHER)
Admission: RE | Admit: 2017-06-28 | Discharge: 2017-06-28 | Disposition: A | Discharge status: Home / Self Care | Payer: No Typology Code available for payment source | Source: Ambulatory Visit | Attending: Cardiology | Admitting: Cardiology

## 2017-06-28 DIAGNOSIS — Z01818 Encounter for other preprocedural examination: Secondary | ICD-10-CM | POA: Insufficient documentation

## 2017-06-28 DIAGNOSIS — I4891 Unspecified atrial fibrillation: Secondary | ICD-10-CM | POA: Insufficient documentation

## 2017-06-28 DIAGNOSIS — I10 Essential (primary) hypertension: Secondary | ICD-10-CM | POA: Diagnosis not present

## 2017-06-28 NOTE — Progress Notes (Signed)
   Complete carotid artery duplex has been performed. No ICA stenosis identified.  Luisalberto, [place] 06/28/2017, 11:16 AM

## 2017-06-29 ENCOUNTER — Telehealth (HOSPITAL_COMMUNITY): Payer: Self-pay | Admitting: *Deleted

## 2017-06-29 NOTE — Telephone Encounter (Signed)
Patient's wife [name] given detailed instructions per Myocardial Perfusion Study Information Sheet for the test on 07/02/17 at 0945. Patient notified to arrive 15 minutes early and that it is imperative to arrive on time for appointment to keep from having the test rescheduled.  If you need to cancel or reschedule your appointment, please call the office within 24 hours of your appointment. . Patient verbalized understanding.Westlynn Fifer, Ranae Palms

## 2017-07-02 ENCOUNTER — Ambulatory Visit (HOSPITAL_COMMUNITY): Payer: No Typology Code available for payment source | Attending: Cardiology

## 2017-07-02 VITALS — Ht 66.0 in | Wt 180.0 lb

## 2017-07-02 DIAGNOSIS — Z8673 Personal history of transient ischemic attack (TIA), and cerebral infarction without residual deficits: Secondary | ICD-10-CM | POA: Diagnosis not present

## 2017-07-02 DIAGNOSIS — Z01818 Encounter for other preprocedural examination: Secondary | ICD-10-CM

## 2017-07-02 DIAGNOSIS — I10 Essential (primary) hypertension: Secondary | ICD-10-CM | POA: Diagnosis not present

## 2017-07-02 DIAGNOSIS — I4891 Unspecified atrial fibrillation: Secondary | ICD-10-CM | POA: Diagnosis not present

## 2017-07-02 DIAGNOSIS — I480 Paroxysmal atrial fibrillation: Secondary | ICD-10-CM

## 2017-07-02 DIAGNOSIS — Z8249 Family history of ischemic heart disease and other diseases of the circulatory system: Secondary | ICD-10-CM | POA: Insufficient documentation

## 2017-07-02 LAB — MYOCARDIAL PERFUSION IMAGING
LV dias vol: 84 mL (ref 62–150)
LV sys vol: 39 mL
Peak HR: 97 {beats}/min
RATE: 0.35
Rest HR: 74 {beats}/min
SDS: 3
SRS: 7
SSS: 8
TID: 0.87

## 2017-07-02 MED ORDER — TECHNETIUM TC 99M TETROFOSMIN IV KIT
10.1000 | PACK | Freq: Once | INTRAVENOUS | Status: AC | PRN
  Administered 2017-07-02: 10.1 via INTRAVENOUS
  Filled 2017-07-02: qty 11

## 2017-07-02 MED ORDER — REGADENOSON 0.4 MG/5ML IV SOLN
0.4000 mg | Freq: Once | INTRAVENOUS | Status: AC
Start: 2017-07-02 — End: 2017-07-02
  Administered 2017-07-02: 0.4 mg via INTRAVENOUS

## 2017-07-02 MED ORDER — TECHNETIUM TC 99M TETROFOSMIN IV KIT
31.3000 | PACK | Freq: Once | INTRAVENOUS | Status: AC | PRN
  Administered 2017-07-02: 31.3 via INTRAVENOUS
  Filled 2017-07-02: qty 32

## 2017-07-03 ENCOUNTER — Telehealth: Payer: Self-pay | Admitting: Cardiology

## 2017-07-03 NOTE — Telephone Encounter (Signed)
Please call Whitney to answer questions regarding cardiac clearance

## 2017-07-03 NOTE — Telephone Encounter (Signed)
Faxed cardiac testing results to [facility] for clearance, reminded that patient is on warfarin and to check with managing physician.

## 2017-07-04 ENCOUNTER — Other Ambulatory Visit: Payer: Self-pay | Admitting: *Deleted

## 2017-07-04 ENCOUNTER — Encounter: Payer: Self-pay | Admitting: *Deleted

## 2017-07-04 DIAGNOSIS — N183 Chronic kidney disease, stage 3 (moderate): Secondary | ICD-10-CM | POA: Diagnosis not present

## 2017-07-04 DIAGNOSIS — I69354 Hemiplegia and hemiparesis following cerebral infarction affecting left non-dominant side: Secondary | ICD-10-CM | POA: Diagnosis not present

## 2017-07-04 DIAGNOSIS — M6281 Muscle weakness (generalized): Secondary | ICD-10-CM | POA: Diagnosis not present

## 2017-07-04 DIAGNOSIS — K810 Acute cholecystitis: Secondary | ICD-10-CM | POA: Diagnosis not present

## 2017-07-04 DIAGNOSIS — I5042 Chronic combined systolic (congestive) and diastolic (congestive) heart failure: Secondary | ICD-10-CM | POA: Diagnosis not present
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
## 2017-07-06 ENCOUNTER — Ambulatory Visit: Payer: Self-pay | Admitting: General Surgery

## 2017-07-06 ENCOUNTER — Telehealth: Payer: Self-pay | Admitting: Internal Medicine

## 2017-07-06 ENCOUNTER — Encounter: Payer: Self-pay | Admitting: Internal Medicine

## 2017-07-06 DIAGNOSIS — A0472 Enterocolitis due to Clostridium difficile, not specified as recurrent: Secondary | ICD-10-CM | POA: Diagnosis not present

## 2017-07-06 DIAGNOSIS — D631 Anemia in chronic kidney disease: Secondary | ICD-10-CM | POA: Diagnosis not present

## 2017-07-06 DIAGNOSIS — Z7901 Long term (current) use of anticoagulants: Secondary | ICD-10-CM | POA: Diagnosis not present

## 2017-07-06 DIAGNOSIS — Z4682 Encounter for fitting and adjustment of non-vascular catheter: Secondary | ICD-10-CM | POA: Diagnosis not present

## 2017-07-06 DIAGNOSIS — I5042 Chronic combined systolic (congestive) and diastolic (congestive) heart failure: Secondary | ICD-10-CM | POA: Diagnosis not present

## 2017-07-06 DIAGNOSIS — I130 Hypertensive heart and chronic kidney disease with heart failure and stage 1 through stage 4 chronic kidney disease, or unspecified chronic kidney disease: Secondary | ICD-10-CM | POA: Diagnosis not present

## 2017-07-06 DIAGNOSIS — K812 Acute cholecystitis with chronic cholecystitis: Secondary | ICD-10-CM | POA: Diagnosis not present

## 2017-07-06 DIAGNOSIS — I480 Paroxysmal atrial fibrillation: Secondary | ICD-10-CM | POA: Diagnosis not present

## 2017-07-06 DIAGNOSIS — G8194 Hemiplegia, unspecified affecting left nondominant side: Secondary | ICD-10-CM | POA: Diagnosis not present

## 2017-07-06 DIAGNOSIS — N183 Chronic kidney disease, stage 3 (moderate): Secondary | ICD-10-CM | POA: Diagnosis not present

## 2017-07-06 DIAGNOSIS — S0180XS Unspecified open wound of other part of head, sequela: Secondary | ICD-10-CM | POA: Diagnosis not present

## 2017-07-06 DIAGNOSIS — G629 Polyneuropathy, unspecified: Secondary | ICD-10-CM | POA: Diagnosis not present

## 2017-07-06 NOTE — Telephone Encounter (Signed)
Called Shannon back no answer LMOM w/MD response.Marland KitchenJohny Chess

## 2017-07-06 NOTE — Telephone Encounter (Signed)
Ok to stay off the losartan for now  [facility] for verbals

## 2017-07-06 NOTE — Telephone Encounter (Signed)
Copied from [place] [id number]. Topic: Quick Communication - See Telephone Encounter >> Jul 06, 2017  2:00 PM Neva Seat wrote: Larene Beach w/ [place] [phone]  Results from today's visit:  PT -  27.4 INR - 2.1 Coumadin dose 7.5 mg daily Swelling in legs and feet - pt was taken off of Losartan when went into facility - does Dr. Jenny Reichmann want him to start back on it.   Verbal order: Home health  2 times a week for 1 week 1 time for 7 weeks

## 2017-07-08 DIAGNOSIS — A0472 Enterocolitis due to Clostridium difficile, not specified as recurrent: Secondary | ICD-10-CM | POA: Diagnosis not present

## 2017-07-08 DIAGNOSIS — K812 Acute cholecystitis with chronic cholecystitis: Secondary | ICD-10-CM | POA: Diagnosis not present

## 2017-07-08 DIAGNOSIS — G8194 Hemiplegia, unspecified affecting left nondominant side: Secondary | ICD-10-CM | POA: Diagnosis not present

## 2017-07-08 DIAGNOSIS — I130 Hypertensive heart and chronic kidney disease with heart failure and stage 1 through stage 4 chronic kidney disease, or unspecified chronic kidney disease: Secondary | ICD-10-CM | POA: Diagnosis not present

## 2017-07-08 DIAGNOSIS — S0180XS Unspecified open wound of other part of head, sequela: Secondary | ICD-10-CM | POA: Diagnosis not present

## 2017-07-08 DIAGNOSIS — Z4682 Encounter for fitting and adjustment of non-vascular catheter: Secondary | ICD-10-CM | POA: Diagnosis not present

## 2017-07-09 ENCOUNTER — Ambulatory Visit (INDEPENDENT_AMBULATORY_CARE_PROVIDER_SITE_OTHER): Payer: Medicare Other | Admitting: *Deleted

## 2017-07-09 ENCOUNTER — Telehealth: Payer: Self-pay | Admitting: *Deleted

## 2017-07-09 ENCOUNTER — Other Ambulatory Visit: Payer: Self-pay

## 2017-07-09 ENCOUNTER — Other Ambulatory Visit: Payer: Self-pay | Admitting: Internal Medicine

## 2017-07-09 DIAGNOSIS — I130 Hypertensive heart and chronic kidney disease with heart failure and stage 1 through stage 4 chronic kidney disease, or unspecified chronic kidney disease: Secondary | ICD-10-CM | POA: Diagnosis not present

## 2017-07-09 DIAGNOSIS — Z7901 Long term (current) use of anticoagulants: Secondary | ICD-10-CM | POA: Diagnosis not present

## 2017-07-09 DIAGNOSIS — G8194 Hemiplegia, unspecified affecting left nondominant side: Secondary | ICD-10-CM | POA: Diagnosis not present

## 2017-07-09 DIAGNOSIS — Z4682 Encounter for fitting and adjustment of non-vascular catheter: Secondary | ICD-10-CM | POA: Diagnosis not present

## 2017-07-09 DIAGNOSIS — K812 Acute cholecystitis with chronic cholecystitis: Secondary | ICD-10-CM | POA: Diagnosis not present

## 2017-07-09 DIAGNOSIS — A0472 Enterocolitis due to Clostridium difficile, not specified as recurrent: Secondary | ICD-10-CM | POA: Diagnosis not present

## 2017-07-09 DIAGNOSIS — S0180XS Unspecified open wound of other part of head, sequela: Secondary | ICD-10-CM | POA: Diagnosis not present

## 2017-07-09 DIAGNOSIS — I4891 Unspecified atrial fibrillation: Secondary | ICD-10-CM | POA: Diagnosis not present

## 2017-07-09 LAB — POCT INR: INR: 2.1 (ref 2.0–3.0)

## 2017-07-09 NOTE — Patient Outreach (Signed)
[facility]) Care Management  07/09/2017  COSBY PROBY 01-31-1932 [id number]    Mr. Cozzens is a 84 year old male. Per chart, member's diagnosis include A-Fib, Traumatic brain injury, Hypertension, Chronic venous insufficiency and Chronic kidney disease.  Referral received from [place] to complete transition of care following member's discharge on 07/05/17.  RN CM contacted Mr. Mclaren to schedule initial home visit. Spoke with member's spouse [name] who reported that Mr. Justiniano was not available to take the call. HIPPA identifiers obtained. Per spouse, Mr. Petite will be available on Thursday June 20th for an initial visit.   Spouse confirmed initiation of Home Health nursing and therapy services. No questions or concerns voiced at this time. Contact information provided. Spouse encouraged to contact RN CM with questions as needed.   PLAN RN CM will complete initial home visit on 07/12/17.     [place] Management [facility] Manager [phone]

## 2017-07-09 NOTE — Telephone Encounter (Signed)
INR 2.1 Friday, currently taking 7.55m daily. Please give SLarene Beacha call @ [phone]

## 2017-07-09 NOTE — Patient Instructions (Signed)
Continue coumadin 1 1/2 tablets daily  Recheck on 07/12/17 Order given to [facility]

## 2017-07-09 NOTE — Telephone Encounter (Signed)
Please refill.

## 2017-07-09 NOTE — Telephone Encounter (Signed)
Done.  See coumadin note. 

## 2017-07-10 ENCOUNTER — Other Ambulatory Visit: Payer: Self-pay

## 2017-07-10 ENCOUNTER — Telehealth: Payer: Self-pay | Admitting: Internal Medicine

## 2017-07-10 DIAGNOSIS — N184 Chronic kidney disease, stage 4 (severe): Secondary | ICD-10-CM | POA: Diagnosis not present

## 2017-07-10 DIAGNOSIS — Z4682 Encounter for fitting and adjustment of non-vascular catheter: Secondary | ICD-10-CM | POA: Diagnosis not present

## 2017-07-10 DIAGNOSIS — N390 Urinary tract infection, site not specified: Secondary | ICD-10-CM | POA: Diagnosis not present

## 2017-07-10 DIAGNOSIS — S0180XS Unspecified open wound of other part of head, sequela: Secondary | ICD-10-CM | POA: Diagnosis not present

## 2017-07-10 DIAGNOSIS — G8194 Hemiplegia, unspecified affecting left nondominant side: Secondary | ICD-10-CM | POA: Diagnosis not present

## 2017-07-10 DIAGNOSIS — I129 Hypertensive chronic kidney disease with stage 1 through stage 4 chronic kidney disease, or unspecified chronic kidney disease: Secondary | ICD-10-CM | POA: Diagnosis not present

## 2017-07-10 DIAGNOSIS — I130 Hypertensive heart and chronic kidney disease with heart failure and stage 1 through stage 4 chronic kidney disease, or unspecified chronic kidney disease: Secondary | ICD-10-CM | POA: Diagnosis not present

## 2017-07-10 DIAGNOSIS — A0472 Enterocolitis due to Clostridium difficile, not specified as recurrent: Secondary | ICD-10-CM | POA: Diagnosis not present

## 2017-07-10 DIAGNOSIS — K812 Acute cholecystitis with chronic cholecystitis: Secondary | ICD-10-CM | POA: Diagnosis not present

## 2017-07-10 NOTE — Patient Outreach (Signed)
[facility]) Care Management  07/10/2017  Austin [name] 1932-10-13 [id number]   Call received from [name] spouse [name]. HIPAA identifiers obtained. [name] requested to cancel the appointment on 07/12/17 due to scheduling conflicts.  Member's appointment rescheduled for Monday July 16, 2017.   PLAN RN CM will follow up on 07/16/17 for Initial visit.   [place] Management [facility] Manager [phone]

## 2017-07-10 NOTE — Telephone Encounter (Signed)
Called Stacey, LVM with verbal orders.

## 2017-07-10 NOTE — Telephone Encounter (Signed)
Copied from [place] [id number]. Topic: Quick Communication - See Telephone Encounter >> Jul 10, 2017  4:18 PM Vernona Rieger wrote: CRM for notification. See Telephone encounter for: 07/10/17.  Stacy, physical therapist from [facility] called and needs verbal orders to see him twice a week for three weeks. Call back @ [phone]

## 2017-07-11 DIAGNOSIS — D631 Anemia in chronic kidney disease: Secondary | ICD-10-CM | POA: Diagnosis not present

## 2017-07-11 DIAGNOSIS — I130 Hypertensive heart and chronic kidney disease with heart failure and stage 1 through stage 4 chronic kidney disease, or unspecified chronic kidney disease: Secondary | ICD-10-CM | POA: Diagnosis not present

## 2017-07-11 DIAGNOSIS — K812 Acute cholecystitis with chronic cholecystitis: Secondary | ICD-10-CM | POA: Diagnosis not present

## 2017-07-11 DIAGNOSIS — A0472 Enterocolitis due to Clostridium difficile, not specified as recurrent: Secondary | ICD-10-CM | POA: Diagnosis not present

## 2017-07-11 DIAGNOSIS — G629 Polyneuropathy, unspecified: Secondary | ICD-10-CM | POA: Diagnosis not present

## 2017-07-11 DIAGNOSIS — I480 Paroxysmal atrial fibrillation: Secondary | ICD-10-CM | POA: Diagnosis not present

## 2017-07-11 DIAGNOSIS — Z7901 Long term (current) use of anticoagulants: Secondary | ICD-10-CM | POA: Diagnosis not present

## 2017-07-11 DIAGNOSIS — N183 Chronic kidney disease, stage 3 (moderate): Secondary | ICD-10-CM | POA: Diagnosis not present

## 2017-07-11 DIAGNOSIS — Z4682 Encounter for fitting and adjustment of non-vascular catheter: Secondary | ICD-10-CM | POA: Diagnosis not present

## 2017-07-11 DIAGNOSIS — I5042 Chronic combined systolic (congestive) and diastolic (congestive) heart failure: Secondary | ICD-10-CM | POA: Diagnosis not present

## 2017-07-11 DIAGNOSIS — G8194 Hemiplegia, unspecified affecting left nondominant side: Secondary | ICD-10-CM | POA: Diagnosis not present

## 2017-07-11 DIAGNOSIS — S0180XS Unspecified open wound of other part of head, sequela: Secondary | ICD-10-CM | POA: Diagnosis not present

## 2017-07-12 ENCOUNTER — Ambulatory Visit (INDEPENDENT_AMBULATORY_CARE_PROVIDER_SITE_OTHER): Payer: Medicare Other | Admitting: *Deleted

## 2017-07-12 ENCOUNTER — Ambulatory Visit: Payer: Self-pay

## 2017-07-12 DIAGNOSIS — G8194 Hemiplegia, unspecified affecting left nondominant side: Secondary | ICD-10-CM | POA: Diagnosis not present

## 2017-07-12 DIAGNOSIS — S0180XS Unspecified open wound of other part of head, sequela: Secondary | ICD-10-CM | POA: Diagnosis not present

## 2017-07-12 DIAGNOSIS — A0472 Enterocolitis due to Clostridium difficile, not specified as recurrent: Secondary | ICD-10-CM | POA: Diagnosis not present

## 2017-07-12 DIAGNOSIS — Z7901 Long term (current) use of anticoagulants: Secondary | ICD-10-CM

## 2017-07-12 DIAGNOSIS — Z4682 Encounter for fitting and adjustment of non-vascular catheter: Secondary | ICD-10-CM | POA: Diagnosis not present

## 2017-07-12 DIAGNOSIS — K812 Acute cholecystitis with chronic cholecystitis: Secondary | ICD-10-CM | POA: Diagnosis not present

## 2017-07-12 DIAGNOSIS — I130 Hypertensive heart and chronic kidney disease with heart failure and stage 1 through stage 4 chronic kidney disease, or unspecified chronic kidney disease: Secondary | ICD-10-CM | POA: Diagnosis not present

## 2017-07-12 LAB — POCT INR: INR: 2.9 (ref 2.0–3.0)

## 2017-07-12 NOTE — Patient Instructions (Signed)
Continue coumadin 1 1/2 tablets daily  Recheck on 07/25/17 Order given to [facility]

## 2017-07-16 ENCOUNTER — Other Ambulatory Visit: Payer: Self-pay

## 2017-07-16 ENCOUNTER — Emergency Department (HOSPITAL_COMMUNITY): Payer: Medicare Other

## 2017-07-16 ENCOUNTER — Encounter (HOSPITAL_COMMUNITY): Payer: Self-pay | Admitting: Emergency Medicine

## 2017-07-16 ENCOUNTER — Inpatient Hospital Stay (HOSPITAL_COMMUNITY)
Admission: EM | Admit: 2017-07-16 | Discharge: 2017-07-19 | DRG: 481 | Disposition: A | Discharge status: Skilled Nursing Facility | Payer: Medicare Other | Attending: Internal Medicine | Admitting: Internal Medicine

## 2017-07-16 DIAGNOSIS — R0902 Hypoxemia: Secondary | ICD-10-CM | POA: Diagnosis not present

## 2017-07-16 DIAGNOSIS — K219 Gastro-esophageal reflux disease without esophagitis: Secondary | ICD-10-CM | POA: Diagnosis present

## 2017-07-16 DIAGNOSIS — A0472 Enterocolitis due to Clostridium difficile, not specified as recurrent: Secondary | ICD-10-CM | POA: Diagnosis not present

## 2017-07-16 DIAGNOSIS — Z9689 Presence of other specified functional implants: Secondary | ICD-10-CM | POA: Diagnosis not present

## 2017-07-16 DIAGNOSIS — K812 Acute cholecystitis with chronic cholecystitis: Secondary | ICD-10-CM | POA: Diagnosis not present

## 2017-07-16 DIAGNOSIS — Z87891 Personal history of nicotine dependence: Secondary | ICD-10-CM

## 2017-07-16 DIAGNOSIS — Z6829 Body mass index (BMI) 29.0-29.9, adult: Secondary | ICD-10-CM

## 2017-07-16 DIAGNOSIS — N1832 Chronic kidney disease, stage 3b: Secondary | ICD-10-CM | POA: Diagnosis present

## 2017-07-16 DIAGNOSIS — R262 Difficulty in walking, not elsewhere classified: Secondary | ICD-10-CM | POA: Diagnosis not present

## 2017-07-16 DIAGNOSIS — Z7901 Long term (current) use of anticoagulants: Secondary | ICD-10-CM | POA: Diagnosis not present

## 2017-07-16 DIAGNOSIS — S72002A Fracture of unspecified part of neck of left femur, initial encounter for closed fracture: Secondary | ICD-10-CM | POA: Diagnosis not present

## 2017-07-16 DIAGNOSIS — S72012A Unspecified intracapsular fracture of left femur, initial encounter for closed fracture: Secondary | ICD-10-CM | POA: Diagnosis not present

## 2017-07-16 DIAGNOSIS — M25552 Pain in left hip: Secondary | ICD-10-CM | POA: Diagnosis not present

## 2017-07-16 DIAGNOSIS — E782 Mixed hyperlipidemia: Secondary | ICD-10-CM | POA: Diagnosis present

## 2017-07-16 DIAGNOSIS — I5022 Chronic systolic (congestive) heart failure: Secondary | ICD-10-CM | POA: Diagnosis present

## 2017-07-16 DIAGNOSIS — E785 Hyperlipidemia, unspecified: Secondary | ICD-10-CM | POA: Diagnosis present

## 2017-07-16 DIAGNOSIS — N183 Chronic kidney disease, stage 3 unspecified: Secondary | ICD-10-CM | POA: Diagnosis present

## 2017-07-16 DIAGNOSIS — S3993XA Unspecified injury of pelvis, initial encounter: Secondary | ICD-10-CM | POA: Diagnosis not present

## 2017-07-16 DIAGNOSIS — R279 Unspecified lack of coordination: Secondary | ICD-10-CM | POA: Diagnosis not present

## 2017-07-16 DIAGNOSIS — D631 Anemia in chronic kidney disease: Secondary | ICD-10-CM | POA: Diagnosis not present

## 2017-07-16 DIAGNOSIS — S0180XS Unspecified open wound of other part of head, sequela: Secondary | ICD-10-CM | POA: Diagnosis not present

## 2017-07-16 DIAGNOSIS — I129 Hypertensive chronic kidney disease with stage 1 through stage 4 chronic kidney disease, or unspecified chronic kidney disease: Secondary | ICD-10-CM | POA: Diagnosis not present

## 2017-07-16 DIAGNOSIS — Z9889 Other specified postprocedural states: Secondary | ICD-10-CM

## 2017-07-16 DIAGNOSIS — Y92002 Bathroom of unspecified non-institutional (private) residence single-family (private) house as the place of occurrence of the external cause: Secondary | ICD-10-CM | POA: Diagnosis not present

## 2017-07-16 DIAGNOSIS — Z4682 Encounter for fitting and adjustment of non-vascular catheter: Secondary | ICD-10-CM | POA: Diagnosis not present

## 2017-07-16 DIAGNOSIS — Z801 Family history of malignant neoplasm of trachea, bronchus and lung: Secondary | ICD-10-CM | POA: Diagnosis not present

## 2017-07-16 DIAGNOSIS — T148XXS Other injury of unspecified body region, sequela: Secondary | ICD-10-CM | POA: Diagnosis not present

## 2017-07-16 DIAGNOSIS — Z86718 Personal history of other venous thrombosis and embolism: Secondary | ICD-10-CM | POA: Diagnosis not present

## 2017-07-16 DIAGNOSIS — Z8249 Family history of ischemic heart disease and other diseases of the circulatory system: Secondary | ICD-10-CM

## 2017-07-16 DIAGNOSIS — Z8546 Personal history of malignant neoplasm of prostate: Secondary | ICD-10-CM

## 2017-07-16 DIAGNOSIS — S72065D Nondisplaced articular fracture of head of left femur, subsequent encounter for closed fracture with routine healing: Secondary | ICD-10-CM | POA: Diagnosis not present

## 2017-07-16 DIAGNOSIS — G8114 Spastic hemiplegia affecting left nondominant side: Secondary | ICD-10-CM | POA: Diagnosis not present

## 2017-07-16 DIAGNOSIS — W010XXA Fall on same level from slipping, tripping and stumbling without subsequent striking against object, initial encounter: Secondary | ICD-10-CM | POA: Diagnosis present

## 2017-07-16 DIAGNOSIS — Z8781 Personal history of (healed) traumatic fracture: Secondary | ICD-10-CM

## 2017-07-16 DIAGNOSIS — I10 Essential (primary) hypertension: Secondary | ICD-10-CM | POA: Diagnosis not present

## 2017-07-16 DIAGNOSIS — I130 Hypertensive heart and chronic kidney disease with heart failure and stage 1 through stage 4 chronic kidney disease, or unspecified chronic kidney disease: Secondary | ICD-10-CM | POA: Diagnosis present

## 2017-07-16 DIAGNOSIS — E46 Unspecified protein-calorie malnutrition: Secondary | ICD-10-CM | POA: Diagnosis not present

## 2017-07-16 DIAGNOSIS — S72009A Fracture of unspecified part of neck of unspecified femur, initial encounter for closed fracture: Secondary | ICD-10-CM | POA: Diagnosis not present

## 2017-07-16 DIAGNOSIS — M6281 Muscle weakness (generalized): Secondary | ICD-10-CM | POA: Diagnosis not present

## 2017-07-16 DIAGNOSIS — W19XXXA Unspecified fall, initial encounter: Secondary | ICD-10-CM

## 2017-07-16 DIAGNOSIS — R52 Pain, unspecified: Secondary | ICD-10-CM | POA: Diagnosis not present

## 2017-07-16 DIAGNOSIS — S8992XA Unspecified injury of left lower leg, initial encounter: Secondary | ICD-10-CM | POA: Diagnosis not present

## 2017-07-16 DIAGNOSIS — S0990XA Unspecified injury of head, initial encounter: Secondary | ICD-10-CM | POA: Diagnosis not present

## 2017-07-16 DIAGNOSIS — D638 Anemia in other chronic diseases classified elsewhere: Secondary | ICD-10-CM | POA: Diagnosis not present

## 2017-07-16 DIAGNOSIS — G8194 Hemiplegia, unspecified affecting left nondominant side: Secondary | ICD-10-CM | POA: Diagnosis not present

## 2017-07-16 DIAGNOSIS — Z66 Do not resuscitate: Secondary | ICD-10-CM | POA: Diagnosis present

## 2017-07-16 DIAGNOSIS — G839 Paralytic syndrome, unspecified: Secondary | ICD-10-CM | POA: Insufficient documentation

## 2017-07-16 DIAGNOSIS — S79912A Unspecified injury of left hip, initial encounter: Secondary | ICD-10-CM | POA: Diagnosis not present

## 2017-07-16 DIAGNOSIS — M79605 Pain in left leg: Secondary | ICD-10-CM | POA: Diagnosis not present

## 2017-07-16 DIAGNOSIS — R000 Tachycardia, unspecified: Secondary | ICD-10-CM | POA: Diagnosis not present

## 2017-07-16 DIAGNOSIS — S72002D Fracture of unspecified part of neck of left femur, subsequent encounter for closed fracture with routine healing: Secondary | ICD-10-CM | POA: Diagnosis not present

## 2017-07-16 DIAGNOSIS — Z419 Encounter for procedure for purposes other than remedying health state, unspecified: Secondary | ICD-10-CM

## 2017-07-16 LAB — BASIC METABOLIC PANEL
Anion gap: 10 (ref 5–15)
BUN: 18 mg/dL (ref 6–20)
CO2: 25 mmol/L (ref 22–32)
Calcium: 8.9 mg/dL (ref 8.9–10.3)
Chloride: 110 mmol/L (ref 101–111)
Creatinine, Ser: 1.52 mg/dL — ABNORMAL HIGH (ref 0.61–1.24)
GFR calc Af Amer: 47 mL/min — ABNORMAL LOW (ref >60)
GFR calc non Af Amer: 41 mL/min — ABNORMAL LOW (ref >60)
Glucose, Bld: 131 mg/dL — ABNORMAL HIGH (ref 65–99)
Potassium: 3.7 mmol/L (ref 3.5–5.1)
Sodium: 145 mmol/L (ref 135–145)

## 2017-07-16 LAB — CBC WITH DIFFERENTIAL/PLATELET
Basophils Absolute: 0 10*3/uL (ref 0.0–0.1)
Basophils Relative: 0 %
Eosinophils Absolute: 0.2 10*3/uL (ref 0.0–0.7)
Eosinophils Relative: 1 %
HCT: 31.9 % — ABNORMAL LOW (ref 39.0–52.0)
Hemoglobin: 9.8 g/dL — ABNORMAL LOW (ref 13.0–17.0)
Lymphocytes Relative: 10 %
Lymphs Abs: 1.1 10*3/uL (ref 0.7–4.0)
MCH: 30.3 pg (ref 26.0–34.0)
MCHC: 30.7 g/dL (ref 30.0–36.0)
MCV: 98.8 fL (ref 78.0–100.0)
Monocytes Absolute: 0.8 10*3/uL (ref 0.1–1.0)
Monocytes Relative: 8 %
Neutro Abs: 8.7 10*3/uL — ABNORMAL HIGH (ref 1.7–7.7)
Neutrophils Relative %: 81 %
Platelets: 228 10*3/uL (ref 150–400)
RBC: 3.23 MIL/uL — ABNORMAL LOW (ref 4.22–5.81)
RDW: 16.8 % — ABNORMAL HIGH (ref 11.5–15.5)
WBC: 10.8 10*3/uL — ABNORMAL HIGH (ref 4.0–10.5)

## 2017-07-16 LAB — PROTIME-INR
INR: 2.73
Prothrombin Time: 28.7 seconds — ABNORMAL HIGH (ref 11.4–15.2)

## 2017-07-16 MED ORDER — ROSUVASTATIN CALCIUM 10 MG PO TABS
10.0000 mg | ORAL_TABLET | Freq: Every day | ORAL | Status: DC
  Administered 2017-07-17 – 2017-07-19 (×3): 10 mg via ORAL
  Filled 2017-07-16 (×3): qty 1

## 2017-07-16 MED ORDER — SODIUM CHLORIDE 0.9% FLUSH
3.0000 mL | Freq: Two times a day (BID) | INTRAVENOUS | Status: DC
  Administered 2017-07-17 – 2017-07-18 (×4): 3 mL via INTRAVENOUS

## 2017-07-16 MED ORDER — ELDERTONIC PO LIQD
15.0000 mL | Freq: Three times a day (TID) | ORAL | Status: DC
  Filled 2017-07-16 (×5): qty 15

## 2017-07-16 MED ORDER — GABAPENTIN 300 MG PO CAPS
300.0000 mg | ORAL_CAPSULE | Freq: Every day | ORAL | Status: DC
  Administered 2017-07-17 – 2017-07-18 (×3): 300 mg via ORAL
  Filled 2017-07-16 (×3): qty 1

## 2017-07-16 MED ORDER — SODIUM CHLORIDE 0.9 % IV SOLN: 250.0000 mL | INTRAVENOUS | Status: DC | PRN

## 2017-07-16 MED ORDER — SODIUM CHLORIDE 0.9% FLUSH: 3.0000 mL | INTRAVENOUS | Status: DC | PRN

## 2017-07-16 MED ORDER — VITAMIN D 1000 UNITS PO TABS
2000.0000 [IU] | ORAL_TABLET | Freq: Every day | ORAL | Status: DC
Start: 2017-07-17 — End: 2017-07-19
  Administered 2017-07-17 – 2017-07-19 (×2): 2000 [IU] via ORAL
  Filled 2017-07-16 (×2): qty 2

## 2017-07-16 MED ORDER — PANTOPRAZOLE SODIUM 40 MG PO TBEC
40.0000 mg | DELAYED_RELEASE_TABLET | Freq: Every day | ORAL | Status: DC
  Administered 2017-07-17: 40 mg via ORAL
  Filled 2017-07-16 (×2): qty 1

## 2017-07-16 MED ORDER — GABAPENTIN 100 MG PO CAPS
100.0000 mg | ORAL_CAPSULE | Freq: Two times a day (BID) | ORAL | Status: DC
  Administered 2017-07-17 – 2017-07-19 (×5): 100 mg via ORAL
  Filled 2017-07-16 (×5): qty 1

## 2017-07-16 MED ORDER — ONDANSETRON HCL 4 MG/2ML IJ SOLN
4.0000 mg | Freq: Four times a day (QID) | INTRAMUSCULAR | Status: DC | PRN
Start: 2017-07-16 — End: 2017-07-19

## 2017-07-16 MED ORDER — ONDANSETRON HCL 4 MG PO TABS: 4.0000 mg | ORAL_TABLET | Freq: Four times a day (QID) | ORAL | Status: DC | PRN

## 2017-07-16 MED ORDER — ACETAMINOPHEN 325 MG PO TABS
650.0000 mg | ORAL_TABLET | Freq: Three times a day (TID) | ORAL | Status: DC | PRN
  Filled 2017-07-16: qty 2

## 2017-07-16 MED ORDER — KETOROLAC TROMETHAMINE 15 MG/ML IJ SOLN
15.0000 mg | Freq: Four times a day (QID) | INTRAMUSCULAR | Status: DC | PRN
  Administered 2017-07-17: 15 mg via INTRAVENOUS
  Filled 2017-07-16: qty 1

## 2017-07-16 NOTE — Patient Outreach (Signed)
[facility]) Care Management  [place]  07/16/2017   JAHLON BAINES [birth year] [id number]  Subjective:  Initial home visit complete. Mr. Nettleton was alert, oriented and reported that he "felt good today."    Objective:  BP 132/68 (BP Location: Right Arm, Patient Position: Sitting, Cuff Size: Normal)   Pulse 84   Resp 20   SpO2 96%    Encounter Medications:  Outpatient Encounter Medications as of 07/16/2017  Medication Sig Note  . acetaminophen (TYLENOL) 650 MG CR tablet Take 650 mg by mouth every 8 (eight) hours as needed for pain.   [name] gabapentin (NEURONTIN) 100 MG capsule Take 100 mg by mouth.   . co-enzyme Q-10 30 MG capsule Take 400 mg by mouth daily.    [name] lisinopril (PRINIVIL,ZESTRIL) 10 MG tablet Take 10 mg by mouth daily.   . Misc Natural Products (TART CHERRY ADVANCED PO) Take 1,200 mg by mouth.   . Multiple Vitamin (MULTIVITAMIN) tablet Take 1 tablet by mouth daily.   . Nutritional Supplements (FEEDING SUPPLEMENT, NEPRO CARB STEADY,) LIQD Take 237 mLs by mouth 2 (two) times daily between meals. (Patient not taking: Reported on 07/16/2017) 07/16/2017: Good Appetite  . omeprazole (PRILOSEC) 20 MG capsule Take 40 mg by mouth daily.   [name] warfarin (COUMADIN) 5 MG tablet TAKE 1 & 1/2 TABLET BY MOUTH DAILY.    No facility-administered encounter medications on file as of 07/16/2017.      Assessment:  RN CM met with Mr. Bernasconi to complete assessment and discuss [place] Management services. Mr. Brassfield spouse [name] and personal care aide were present during the visit. Spouse reported initiation of Home Health services to include Physical Therapy, Occupation Therapy and Skilled Nursing.  Mr. Ground reported medication compliance and stated that medications are prepared by his wife. He denied previous falls or symptoms indicating possible skin break down. Spouse and personal care aide confirmed. Cholecystostomy drain intact with minimal drainage. No  redness or erythema to insertion site. Member to follow up with MD on tomorrow.  Mr. Sommers and spouse do not anticipate a need for ongoing Social Work or Pharmacy services but inquired about the availability of transportation assistance. Mrs. Godsey reported that Mr. Hoecker is scheduled to report to [facility] for a minor surgical procedure at 5:30am on 08/17/17. She stated that assistance would be needed to transport Mr. Sobek to the hospital but does not anticipate transportation needs following the surgery.  Member and spouse denied questions or urgent concerns at this time. Advised to contact RN CM with questions as needed.  THN CM Care Plan Problem One     Most Recent Value  Care Plan Problem One  High Risk for Infection  Role Documenting the Problem One  Care Management [place] for Problem One  Active  THN Long Term Goal   Over the next 45 days, patient will remain free of infection.  THN Long Term Goal Start Date  07/16/17  Interventions for Problem One Long Term Goal  RN CM discussed maintenance of cholecystostomy site and reviewed signs and symptoms of infection.  THN CM Short Term Goal #1   Over the next 30 days, spouse will monitor cholecystostomy drainage and keep site clean.  THN CM Short Term Goal #1 Start Date  07/16/17  Interventions for Short Term Goal #1  RN CM reviewed instructions for monitoring drainage and importance of notifying MD with changes.    [facility] CM Care Plan Problem Two  Most Recent Value  Care Plan Problem Two  High Fall Risk due to left hemiplegia.  Role Documenting the Problem Two  Care Management [place] for Problem Two  Active  Interventions for Problem Two Long Term Goal   RN CM discussed fall prevention measures with patient, spouse and Care Aide.  THN Long Term Goal  Over the next 45 days, patient will remain free of falls.  THN Long Term Goal Start Date  07/16/17  THN CM Short Term Goal #1   Over the next 30 days  patient will continue to utilize wheelchair and assistance from spouse/care aide when ambulating and transferring.  THN CM Short Term Goal #1 Start Date  07/16/17  Interventions for Short Term Goal #2   RN CM reviewed procedures for supporting left side when patient stands or transfers from wheelchair.    [facility] CM Care Plan Problem Three     Most Recent Value  Care Plan Problem Three  High Risk for Pressure Wounds due to limited mobility.  Role Documenting the Problem Three  Care Management Coordinator  Care Plan for Problem Three  Active  THN Long Term Goal   Over the next 45 days, patient's skin will remain intact and free of pressure wounds.  THN Long Term Goal Start Date  07/16/17  Interventions for Problem Three Long Term Goal  RN CM discussed pressure wound prevention measures with patient, spouse and care aide.  THN CM Short Term Goal #1   Patient's spouse and care aide will monitor the member's skin integrity over the next 30 days.  THN CM Short Term Goal #1 Start Date  07/16/17  Interventions for Short Term Goal #1  RN CM discussed signs and symptoms of compromised skin integrity.       PLAN RN CM will continue weekly outreach. Will contact BSW regarding transportation assistance.    [place] [phone]

## 2017-07-16 NOTE — ED Triage Notes (Signed)
Pt c/o left upper leg pain after tripping over threshold doing to the rest room. Pt states there is no pain unless he tries to use that leg. [name] also paralyzed on the left side.
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
## 2017-07-17 ENCOUNTER — Inpatient Hospital Stay: Payer: Medicare Other | Admitting: Internal Medicine

## 2017-07-17 DIAGNOSIS — D638 Anemia in other chronic diseases classified elsewhere: Secondary | ICD-10-CM

## 2017-07-17 DIAGNOSIS — S72002A Fracture of unspecified part of neck of left femur, initial encounter for closed fracture: Secondary | ICD-10-CM

## 2017-07-17 LAB — BASIC METABOLIC PANEL
Anion gap: 7 (ref 5–15)
BUN: 19 mg/dL (ref 8–23)
CO2: 26 mmol/L (ref 22–32)
Calcium: 8.5 mg/dL — ABNORMAL LOW (ref 8.9–10.3)
Chloride: 111 mmol/L (ref 98–111)
Creatinine, Ser: 1.64 mg/dL — ABNORMAL HIGH (ref 0.61–1.24)
GFR calc Af Amer: 43 mL/min — ABNORMAL LOW (ref >60)
GFR calc non Af Amer: 37 mL/min — ABNORMAL LOW (ref >60)
Glucose, Bld: 98 mg/dL (ref 70–99)
Potassium: 3.7 mmol/L (ref 3.5–5.1)
Sodium: 144 mmol/L (ref 135–145)

## 2017-07-17 LAB — CBC
HCT: 26.3 % — ABNORMAL LOW (ref 39.0–52.0)
Hemoglobin: 8 g/dL — ABNORMAL LOW (ref 13.0–17.0)
MCH: 30.1 pg (ref 26.0–34.0)
MCHC: 30.4 g/dL (ref 30.0–36.0)
MCV: 98.9 fL (ref 78.0–100.0)
Platelets: 175 10*3/uL (ref 150–400)
RBC: 2.66 MIL/uL — ABNORMAL LOW (ref 4.22–5.81)
RDW: 17 % — ABNORMAL HIGH (ref 11.5–15.5)
WBC: 7 10*3/uL (ref 4.0–10.5)

## 2017-07-17 LAB — MRSA PCR SCREENING: MRSA by PCR: POSITIVE — AB

## 2017-07-17 LAB — PREPARE RBC (CROSSMATCH)

## 2017-07-17 LAB — PROTIME-INR
INR: 2.97
Prothrombin Time: 30.7 seconds — ABNORMAL HIGH (ref 11.4–15.2)

## 2017-07-17 LAB — ABO/RH: ABO/RH(D): A POS

## 2017-07-17 MED ORDER — POVIDONE-IODINE 10 % EX SWAB: 2.0000 "application " | Freq: Once | CUTANEOUS | Status: DC

## 2017-07-17 MED ORDER — ADULT MULTIVITAMIN LIQUID CH
15.0000 mL | Freq: Every day | ORAL | Status: DC
  Administered 2017-07-17 – 2017-07-19 (×2): 15 mL via ORAL
  Filled 2017-07-17 (×4): qty 15

## 2017-07-17 MED ORDER — SODIUM CHLORIDE 0.9% IV SOLUTION
Freq: Once | INTRAVENOUS | Status: AC
  Administered 2017-07-17: 13:00:00 via INTRAVENOUS

## 2017-07-17 MED ORDER — OXYCODONE-ACETAMINOPHEN 5-325 MG PO TABS: 2.0000 | ORAL_TABLET | ORAL | Status: DC | PRN

## 2017-07-17 MED ORDER — CHLORHEXIDINE GLUCONATE 4 % EX LIQD: 60.0000 mL | Freq: Once | CUTANEOUS | Status: DC

## 2017-07-17 MED ORDER — CHLORHEXIDINE GLUCONATE CLOTH 2 % EX PADS
6.0000 | MEDICATED_PAD | Freq: Every day | CUTANEOUS | Status: DC
  Administered 2017-07-17 – 2017-07-19 (×3): 6 via TOPICAL

## 2017-07-17 MED ORDER — MUPIROCIN 2 % EX OINT
1.0000 "application " | TOPICAL_OINTMENT | Freq: Two times a day (BID) | CUTANEOUS | Status: DC
  Administered 2017-07-17 – 2017-07-18 (×4): 1 via NASAL
  Filled 2017-07-17: qty 22

## 2017-07-17 MED ORDER — VITAMIN K1 10 MG/ML IJ SOLN
10.0000 mg | Freq: Once | INTRAMUSCULAR | Status: AC
  Administered 2017-07-17: 10 mg via SUBCUTANEOUS
  Filled 2017-07-17: qty 1

## 2017-07-17 MED ORDER — CEFAZOLIN SODIUM-DEXTROSE 2-4 GM/100ML-% IV SOLN: 2.0000 g | INTRAVENOUS | Status: AC

## 2017-07-17 NOTE — Clinical Social Work Note (Signed)
Clinical Social Work Assessment  Patient Details  Name: Austin [name] MRN: [id number] Date of Birth: 18-Jun-1932  Date of referral:  07/17/17               Reason for consult:  Facility Placement                Permission sought to share information with:    Permission granted to share information::     Name::        Agency::     Relationship::     Contact Information:  spouse, Mrs. Zobrist, was at bedside   Housing/Transportation Living arrangements for the past 2 months:  [place] of Information:  Patient, Spouse Patient Interpreter Needed:  None Criminal Activity/Legal Involvement Pertinent to Current Situation/Hospitalization:  No - Comment as needed Significant Relationships:  Adult Children, Spouse Lives with:  Spouse Do you feel safe going back to the place where you live?  Yes Need for family participation in patient care:  Yes (Comment)  Care giving concerns:  Patient has a care giver that comes in daily from 8-4.     Social Worker assessment / plan:  Patient just got out of rehab at [facility] two weeks ago. [facility] is providing a nurse, PT and OT in the home (which started last week). Patient has left sided paralysis and uses a hemi walker at baseline. He has not yet reached his baseline from when he initially went to rehab in March. Patient is apprehensive as to whether he desires to return to short term rehab.  He stated that he wanted to see what the surgeon thought about rehab.  Employment status:  Retired Forensic scientist:  Medicare PT Recommendations:  [place] / Referral to community resources:  [place]  Patient/Family's Response to care:  Patient is unsure as to whether he wants to go to rehab.  Patient/Family's Understanding of and Emotional Response to Diagnosis, Current Treatment, and Prognosis:  Patient/family understand patient's diagnosis, treatment and prognosis and are  considering SNF.   Emotional Assessment Appearance:  Appears stated age Attitude/Demeanor/Rapport:    Affect (typically observed):  Accepting, Calm Orientation:  Oriented to Self, Oriented to Place, Oriented to  Time, Oriented to Situation Alcohol / Substance use:  Not Applicable Psych involvement (Current and /or in the community):     Discharge Needs  Concerns to be addressed:    Readmission within the last 30 days:  No Current discharge risk:  None Barriers to Discharge:  No Barriers Identified   Ihor Gully, LCSW 07/17/2017, 4:42 PM
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
## 2017-07-17 NOTE — Progress Notes (Signed)
[facility]  Patient Status: Active (receiving services up to time of hospitalization)  [facility] is providing the following services: RN, PT and OT  If patient discharges after hours, please call [phone].   Austin [name] 07/17/2017, 2:26 PM

## 2017-07-18 ENCOUNTER — Inpatient Hospital Stay (HOSPITAL_COMMUNITY): Payer: Medicare Other | Admitting: Anesthesiology

## 2017-07-18 ENCOUNTER — Encounter (HOSPITAL_COMMUNITY): Payer: Self-pay | Admitting: *Deleted

## 2017-07-18 ENCOUNTER — Inpatient Hospital Stay (HOSPITAL_COMMUNITY): Payer: Medicare Other

## 2017-07-18 ENCOUNTER — Encounter (HOSPITAL_COMMUNITY)
Admission: EM | Disposition: A | Discharge status: Skilled Nursing Facility | Payer: Self-pay | Source: Home / Self Care | Attending: Internal Medicine

## 2017-07-18 DIAGNOSIS — S72009A Fracture of unspecified part of neck of unspecified femur, initial encounter for closed fracture: Secondary | ICD-10-CM

## 2017-07-18 DIAGNOSIS — S72002A Fracture of unspecified part of neck of left femur, initial encounter for closed fracture: Secondary | ICD-10-CM

## 2017-07-18 DIAGNOSIS — N183 Chronic kidney disease, stage 3 (moderate): Secondary | ICD-10-CM

## 2017-07-18 DIAGNOSIS — E785 Hyperlipidemia, unspecified: Secondary | ICD-10-CM

## 2017-07-18 DIAGNOSIS — I10 Essential (primary) hypertension: Secondary | ICD-10-CM

## 2017-07-18 DIAGNOSIS — G8114 Spastic hemiplegia affecting left nondominant side: Secondary | ICD-10-CM

## 2017-07-18 DIAGNOSIS — S72002D Fracture of unspecified part of neck of left femur, subsequent encounter for closed fracture with routine healing: Secondary | ICD-10-CM

## 2017-07-18 HISTORY — PX: HIP PINNING,CANNULATED: SHX1758

## 2017-07-18 LAB — BPAM RBC
Blood Product Expiration Date: 201907162359
Blood Product Expiration Date: 201907252359
ISSUE DATE / TIME: 201906251324
ISSUE DATE / TIME: 201906251824
Unit Type and Rh: 6200
Unit Type and Rh: 6200

## 2017-07-18 LAB — TYPE AND SCREEN
ABO/RH(D): A POS
Antibody Screen: NEGATIVE
Unit division: 0
Unit division: 0

## 2017-07-18 LAB — CBC
HCT: 33.6 % — ABNORMAL LOW (ref 39.0–52.0)
Hemoglobin: 10.7 g/dL — ABNORMAL LOW (ref 13.0–17.0)
MCH: 30.1 pg (ref 26.0–34.0)
MCHC: 31.8 g/dL (ref 30.0–36.0)
MCV: 94.4 fL (ref 78.0–100.0)
Platelets: 152 10*3/uL (ref 150–400)
RBC: 3.56 MIL/uL — ABNORMAL LOW (ref 4.22–5.81)
RDW: 17.6 % — ABNORMAL HIGH (ref 11.5–15.5)
WBC: 7.9 10*3/uL (ref 4.0–10.5)

## 2017-07-18 LAB — PROTIME-INR
INR: 1.8
Prothrombin Time: 20.8 seconds — ABNORMAL HIGH (ref 11.4–15.2)

## 2017-07-18 SURGERY — FIXATION, FEMUR, NECK, PERCUTANEOUS, USING SCREW
Anesthesia: General | Site: Hip | Laterality: Left

## 2017-07-18 MED ORDER — ONDANSETRON HCL 4 MG PO TABS: 4.0000 mg | ORAL_TABLET | Freq: Four times a day (QID) | ORAL | Status: DC | PRN

## 2017-07-18 MED ORDER — LACTATED RINGERS IV SOLN
INTRAVENOUS | Status: DC | PRN
  Administered 2017-07-18: 13:00:00 via INTRAVENOUS

## 2017-07-18 MED ORDER — HYDROCODONE-ACETAMINOPHEN 5-325 MG PO TABS
1.0000 | ORAL_TABLET | ORAL | Status: DC | PRN
  Administered 2017-07-18 – 2017-07-19 (×3): 1 via ORAL
  Filled 2017-07-18 (×3): qty 1

## 2017-07-18 MED ORDER — ONDANSETRON HCL 4 MG/2ML IJ SOLN: 4.0000 mg | Freq: Four times a day (QID) | INTRAMUSCULAR | Status: DC | PRN

## 2017-07-18 MED ORDER — PROPOFOL 10 MG/ML IV BOLUS
INTRAVENOUS | Status: DC | PRN
  Administered 2017-07-18: 20 mg via INTRAVENOUS
  Administered 2017-07-18: 90 mg via INTRAVENOUS
  Administered 2017-07-18: 20 mg via INTRAVENOUS

## 2017-07-18 MED ORDER — CEFAZOLIN SODIUM-DEXTROSE 2-4 GM/100ML-% IV SOLN
2.0000 g | Freq: Once | INTRAVENOUS | Status: AC
  Administered 2017-07-18: 2 g via INTRAVENOUS

## 2017-07-18 MED ORDER — CEFAZOLIN SODIUM-DEXTROSE 2-4 GM/100ML-% IV SOLN
2.0000 g | Freq: Four times a day (QID) | INTRAVENOUS | Status: DC
  Filled 2017-07-18 (×2): qty 100

## 2017-07-18 MED ORDER — CEFAZOLIN SODIUM-DEXTROSE 2-4 GM/100ML-% IV SOLN
2.0000 g | Freq: Four times a day (QID) | INTRAVENOUS | Status: DC
  Filled 2017-07-18: qty 100

## 2017-07-18 MED ORDER — ONDANSETRON HCL 4 MG/2ML IJ SOLN: 4.0000 mg | Freq: Once | INTRAMUSCULAR | Status: DC | PRN

## 2017-07-18 MED ORDER — ONDANSETRON HCL 4 MG/2ML IJ SOLN
INTRAMUSCULAR | Status: AC
  Filled 2017-07-18: qty 2

## 2017-07-18 MED ORDER — MENTHOL 3 MG MT LOZG: 1.0000 | LOZENGE | OROMUCOSAL | Status: DC | PRN

## 2017-07-18 MED ORDER — ACETAMINOPHEN 10 MG/ML IV SOLN
INTRAVENOUS | Status: AC
  Filled 2017-07-18: qty 100

## 2017-07-18 MED ORDER — PROPOFOL 10 MG/ML IV BOLUS
INTRAVENOUS | Status: AC
  Filled 2017-07-18: qty 40

## 2017-07-18 MED ORDER — SODIUM CHLORIDE 0.9% IV SOLUTION: Freq: Once | INTRAVENOUS | Status: DC

## 2017-07-18 MED ORDER — MORPHINE SULFATE (PF) 2 MG/ML IV SOLN: 2.0000 mg | INTRAVENOUS | Status: DC | PRN

## 2017-07-18 MED ORDER — CEFAZOLIN SODIUM-DEXTROSE 2-4 GM/100ML-% IV SOLN
2.0000 g | Freq: Four times a day (QID) | INTRAVENOUS | Status: AC
  Administered 2017-07-18 – 2017-07-19 (×2): 2 g via INTRAVENOUS
  Filled 2017-07-18 (×2): qty 100

## 2017-07-18 MED ORDER — BUPIVACAINE-EPINEPHRINE (PF) 0.5% -1:200000 IJ SOLN
INTRAMUSCULAR | Status: AC
  Filled 2017-07-18: qty 60

## 2017-07-18 MED ORDER — LACTATED RINGERS IV SOLN
INTRAVENOUS | Status: DC
  Administered 2017-07-18: 13:00:00 via INTRAVENOUS

## 2017-07-18 MED ORDER — LIDOCAINE HCL (PF) 0.5 % IJ SOLN
INTRAMUSCULAR | Status: AC
  Filled 2017-07-18: qty 50

## 2017-07-18 MED ORDER — ACETAMINOPHEN 10 MG/ML IV SOLN
1000.0000 mg | Freq: Once | INTRAVENOUS | Status: AC
  Administered 2017-07-18: 1000 mg via INTRAVENOUS

## 2017-07-18 MED ORDER — SODIUM CHLORIDE 0.9 % IV SOLN: 250.0000 mL | INTRAVENOUS | Status: DC | PRN

## 2017-07-18 MED ORDER — BUPIVACAINE IN DEXTROSE 0.75-8.25 % IT SOLN
INTRATHECAL | Status: AC
  Filled 2017-07-18: qty 2

## 2017-07-18 MED ORDER — METOCLOPRAMIDE HCL 10 MG PO TABS: 5.0000 mg | ORAL_TABLET | Freq: Three times a day (TID) | ORAL | Status: DC | PRN

## 2017-07-18 MED ORDER — METOCLOPRAMIDE HCL 5 MG/ML IJ SOLN: 5.0000 mg | Freq: Three times a day (TID) | INTRAMUSCULAR | Status: DC | PRN

## 2017-07-18 MED ORDER — SODIUM CHLORIDE 0.9 % IR SOLN
Status: DC | PRN
  Administered 2017-07-18: 1

## 2017-07-18 MED ORDER — BUPIVACAINE-EPINEPHRINE (PF) 0.5% -1:200000 IJ SOLN
INTRAMUSCULAR | Status: DC | PRN
  Administered 2017-07-18: 60 mL

## 2017-07-18 MED ORDER — HYDROMORPHONE HCL 1 MG/ML IJ SOLN: 0.2500 mg | INTRAMUSCULAR | Status: DC | PRN

## 2017-07-18 MED ORDER — DOCUSATE SODIUM 100 MG PO CAPS
100.0000 mg | ORAL_CAPSULE | Freq: Two times a day (BID) | ORAL | Status: DC
  Administered 2017-07-18 – 2017-07-19 (×2): 100 mg via ORAL
  Filled 2017-07-18 (×2): qty 1

## 2017-07-18 MED ORDER — PHENOL 1.4 % MT LIQD: 1.0000 | OROMUCOSAL | Status: DC | PRN

## 2017-07-18 MED ORDER — MEPERIDINE HCL 50 MG/ML IJ SOLN: 6.2500 mg | INTRAMUSCULAR | Status: DC | PRN

## 2017-07-18 MED ORDER — CEFAZOLIN SODIUM-DEXTROSE 1-4 GM/50ML-% IV SOLN
INTRAVENOUS | Status: AC
  Filled 2017-07-18: qty 100

## 2017-07-18 MED ORDER — HYDROCODONE-ACETAMINOPHEN 7.5-325 MG PO TABS: 1.0000 | ORAL_TABLET | Freq: Once | ORAL | Status: DC | PRN

## 2017-07-18 SURGICAL SUPPLY — 52 items
BIT DRILL 5 ACE CANN QC (BIT) ×2 IMPLANT
BLADE HEX COATED 2.75 (ELECTRODE) ×2 IMPLANT
BNDG GAUZE ELAST 4 BULKY (GAUZE/BANDAGES/DRESSINGS) ×2 IMPLANT
CHLORAPREP W/TINT 26ML (MISCELLANEOUS) ×2 IMPLANT
CLOTH BEACON ORANGE TIMEOUT ST (SAFETY) ×2 IMPLANT
COVER LIGHT HANDLE STERIS (MISCELLANEOUS) ×8 IMPLANT
COVER MAYO STAND XLG (DRAPE) IMPLANT
DECANTER SPIKE VIAL GLASS SM (MISCELLANEOUS) ×4 IMPLANT
DRAPE STERI IOBAN 125X83 (DRAPES) ×2 IMPLANT
DRESSING ALLEVYN LIFE SACRUM (GAUZE/BANDAGES/DRESSINGS) ×1 IMPLANT
DRESSING MEPILEX BORDER 6X8 (GAUZE/BANDAGES/DRESSINGS) ×1 IMPLANT
DRSG MEPILEX BORDER 6X8 (GAUZE/BANDAGES/DRESSINGS) ×2
GLOVE BIOGEL PI IND STRL 7.0 (GLOVE) ×1 IMPLANT
GLOVE BIOGEL PI INDICATOR 7.0 (GLOVE) ×1
GLOVE SKINSENSE NS SZ8.0 LF (GLOVE) ×1
GLOVE SKINSENSE STRL SZ8.0 LF (GLOVE) ×1 IMPLANT
GLOVE SS N UNI LF 8.5 STRL (GLOVE) ×2 IMPLANT
GOWN STRL REUS W/TWL LRG LVL3 (GOWN DISPOSABLE) ×8 IMPLANT
GOWN STRL REUS W/TWL XL LVL3 (GOWN DISPOSABLE) ×2 IMPLANT
INST SET MAJOR BONE (KITS) ×2 IMPLANT
KIT BLADEGUARD II DBL (SET/KITS/TRAYS/PACK) ×2 IMPLANT
KIT TURNOVER KIT A (KITS) ×2 IMPLANT
MANIFOLD NEPTUNE II (INSTRUMENTS) ×2 IMPLANT
MARKER SKIN DUAL TIP RULER LAB (MISCELLANEOUS) ×2 IMPLANT
NDL HYPO 21X1.5 SAFETY (NEEDLE) ×1 IMPLANT
NDL SPNL 18GX3.5 QUINCKE PK (NEEDLE) ×1 IMPLANT
NEEDLE HYPO 21X1.5 SAFETY (NEEDLE) ×2 IMPLANT
NEEDLE SPNL 18GX3.5 QUINCKE PK (NEEDLE) ×2 IMPLANT
NS IRRIG 1000ML POUR BTL (IV SOLUTION) ×2 IMPLANT
PACK BASIC III (CUSTOM PROCEDURE TRAY) ×2
PACK SRG BSC III STRL LF ECLPS (CUSTOM PROCEDURE TRAY) ×1 IMPLANT
PAD ABD 5X9 TENDERSORB (GAUZE/BANDAGES/DRESSINGS) ×2 IMPLANT
PENCIL HANDSWITCHING (ELECTRODE) ×2 IMPLANT
PIN THREADED GUIDE ACE (PIN) ×6 IMPLANT
SCREW CANN 22X6.5X100 (Screw) IMPLANT
SCREW CANN 6.5 100MM (Screw) ×2 IMPLANT
SCREW CANN 6.5 80MM (Screw) ×2 IMPLANT
SCREW CANN 6.5 90MM (Screw) ×2 IMPLANT
SCREW CANN LG 6.5 FLT 80X22 (Screw) IMPLANT
SCREW CANN LG 6.5 FLT 90X22 (Screw) IMPLANT
SET BASIN LINEN APH (SET/KITS/TRAYS/PACK) ×2 IMPLANT
SPONGE LAP 18X18 X RAY DECT (DISPOSABLE) ×2 IMPLANT
STAPLER VISISTAT 35W (STAPLE) ×2 IMPLANT
SUT BRALON NAB BRD #1 30IN (SUTURE) IMPLANT
SUT MNCRL 0 VIOLET CTX 36 (SUTURE) ×1 IMPLANT
SUT MON AB 2-0 CT1 36 (SUTURE) ×2 IMPLANT
SUT MONOCRYL 0 CTX 36 (SUTURE) ×2
SYR 30ML LL (SYRINGE) ×2 IMPLANT
SYR BULB IRRIGATION 50ML (SYRINGE) ×4 IMPLANT
TOWEL OR 17X26 4PK STRL BLUE (TOWEL DISPOSABLE) ×2 IMPLANT
WASHER ACECAN 6.5 (Washer) ×3 IMPLANT
YANKAUER SUCT BULB TIP 10FT TU (MISCELLANEOUS) ×2 IMPLANT

## 2017-07-18 NOTE — Anesthesia Procedure Notes (Signed)
Procedure Name: LMA Insertion Date/Time: 07/18/2017 2:05 PM Performed by: Charmaine Downs, CRNA Pre-anesthesia Checklist: Patient identified, Patient being monitored, Emergency Drugs available, Timeout performed and Suction available Patient Re-evaluated:Patient Re-evaluated prior to induction Oxygen Delivery Method: Circle System Utilized Preoxygenation: Pre-oxygenation with 100% oxygen Induction Type: IV induction Ventilation: Mask ventilation without difficulty LMA: LMA inserted LMA Size: 4.0 Number of attempts: 1 Placement Confirmation: positive ETCO2 and breath sounds checked- equal and bilateral Tube secured with: Tape Dental Injury: Teeth and Oropharynx as per pre-operative assessment

## 2017-07-18 NOTE — Transfer of Care (Signed)
Immediate Anesthesia Transfer of Care Note  Patient: Austin [name]  Procedure(s) Performed: CANNULATED HIP PINNING (Left Hip)  Patient Location: PACU  Anesthesia Type:General  Level of Consciousness: awake, alert  and patient cooperative  Airway & Oxygen Therapy: Patient Spontanous Breathing  Post-op Assessment: Report given to RN and Post -op Vital signs reviewed and stable  Post vital signs: Reviewed and stable  Last Vitals:  Vitals Value Taken Time  BP    Temp    Pulse    Resp    SpO2      Last Pain:  Vitals:   07/18/17 1145  TempSrc: Oral  PainSc: 0-No pain      Patients Stated Pain Goal: 0 (11/91/47 8295)  Complications: No apparent anesthesia complications

## 2017-07-18 NOTE — Anesthesia Procedure Notes (Signed)
Performed by: Charmaine Downs, CRNA

## 2017-07-18 NOTE — Progress Notes (Signed)
ANTICOAGULATION CONSULT NOTE - Initial Consult  Pharmacy Consult for warfarin Indication: history of DVT  No Known Allergies  Patient Measurements: Height: 5\' 6"  (167.6 cm) Weight: 184 lb 1.4 oz (83.5 kg) IBW/kg (Calculated) : 63.8   Vital Signs: Temp: 98.3 F (36.8 C) (06/26 1145) Temp Source: Oral (06/26 1145) BP: 163/83 (06/26 1240) Pulse Rate: 68 (06/26 1145)  Labs: Recent Labs    07/16/17 2043 07/17/17 0635 07/18/17 0436  HGB 9.8* 8.0* 10.7*  HCT 31.9* 26.3* 33.6*  PLT 228 175 152  LABPROT 28.7* 30.7* 20.8*  INR 2.73 2.97 1.80  CREATININE 1.52* 1.64*  --     Estimated Creatinine Clearance: 35.2 mL/min (A) (by C-G formula based on SCr of 1.64 mg/dL (H)).   Medical History: Past Medical History:  Diagnosis Date  . ADENOCARCINOMA, PROSTATE, GLEASON GRADE 5 01/21/2009  . ALLERGIC RHINITIS 01/14/2007  . CKD (chronic kidney disease) stage 3, GFR 30-59 ml/min (HCC) 04/30/2015  . COLONIC POLYPS, HX OF 01/14/2007  . ELEVATED PROSTATE SPECIFIC ANTIGEN 03/27/2008  . ESOPHAGITIS 01/14/2007  . HYPERLIPIDEMIA 01/14/2007  . HYPERTENSION 01/14/2007  . LIBIDO, DECREASED 01/15/2007  . Paralysis ([place])    from [place] 02/2013    Medications:  Medications Prior to Admission  Medication Sig Dispense Refill Last Dose  . acetaminophen (TYLENOL) 650 MG CR tablet Take 650 mg by mouth every 8 (eight) hours as needed for pain.   unknown  . Cholecalciferol (VITAMIN D) 2000 units CAPS Take 1 capsule by mouth daily.   07/16/2017 at Unknown time  . furosemide (LASIX) 20 MG tablet Take 20 mg by mouth daily.   07/16/2017 at Unknown time  . gabapentin (NEURONTIN) 100 MG capsule Take 100 mg by mouth 2 (two) times daily.   07/16/2017 at Unknown time  . gabapentin (NEURONTIN) 300 MG capsule Take 300 mg by mouth at bedtime.    07/15/2017 at Unknown time  . Iron-Vitamins (GERITOL) LIQD Take 15 mLs by mouth 3 (three) times daily.   07/16/2017 at Unknown time  . Misc Natural Products (TART CHERRY ADVANCED  PO) Take 1 capsule by mouth daily.    07/16/2017 at Unknown time  . omeprazole (PRILOSEC) 40 MG capsule Take 40 mg by mouth daily.    07/16/2017 at Unknown time  . rosuvastatin (CRESTOR) 10 MG tablet Take 10 mg by mouth daily.   07/16/2017 at Unknown time  . warfarin (COUMADIN) 5 MG tablet TAKE 1 & 1/2 TABLET BY MOUTH DAILY. 60 tablet 3 07/15/2017 at 2100  . Nutritional Supplements (FEEDING SUPPLEMENT, NEPRO CARB STEADY,) LIQD Take 237 mLs by mouth 2 (two) times daily between meals. (Patient not taking: Reported on 07/16/2017)  0 Not Taking    Assessment: Pharmacy consulted to dose warfarin in patient with history of DVT.  Patient is having surgery for nondisplaced left femoral neck fracture.  Start warfarin in 24 hours per MD.  Goal of Therapy:  INR 2-3 Monitor platelets by anticoagulation protocol: Yes   Plan:  Start warfarin in 24 hours Monitor INR and s/s of bleeding  Ramond Craver 07/18/2017,3:34 PM

## 2017-07-18 NOTE — Anesthesia Postprocedure Evaluation (Deleted)
Anesthesia Post Note  Patient: Austin [name]  Procedure(s) Performed: CANNULATED HIP PINNING (Left )  Patient location during evaluation: PACU Anesthesia Type: General Level of consciousness: awake and patient cooperative Pain management: pain level controlled Vital Signs Assessment: post-procedure vital signs reviewed and stable Respiratory status: spontaneous breathing, nonlabored ventilation and respiratory function stable Cardiovascular status: blood pressure returned to baseline Postop Assessment: no apparent nausea or vomiting Anesthetic complications: no     Last Vitals:  Vitals:   07/18/17 1250 07/18/17 1255  BP:    Pulse:    Resp: (!) 21 20  Temp:    SpO2: 96% 95%    Last Pain:  Vitals:   07/18/17 1145  TempSrc: Oral  PainSc: 0-No pain                 Taro Hidrogo J

## 2017-07-18 NOTE — Care Management Important Message (Signed)
Important Message  Patient Details  Name: Austin [name] MRN: [id number] Date of Birth: [birth year]   Medicare Important Message Given:  Yes    Shelda Altes 07/18/2017, 11:24 AM

## 2017-07-18 NOTE — Clinical Social Work Note (Signed)
Contact received from [facility], spouse. She stated that patient had reconsidered after speaking with physician and would go to SNF for short term rehab.     Nashay Brickley, Clydene Pugh, LCSW
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
## 2017-07-18 NOTE — Interval H&P Note (Signed)
History and Physical Interval Note:  07/18/2017 12:40 PM  [name]  has presented today for surgery, with the diagnosis of Left hip femoral neck fracture  The various methods of treatment have been discussed with the patient and family. After consideration of risks, benefits and other options for treatment, the patient has consented to  Procedure(s): CANNULATED HIP PINNING (Left) as a surgical intervention .  The patient's history has been reviewed, patient examined, no change in status, stable for surgery.  I have reviewed the patient's chart and labs.  Questions were answered to the patient's satisfaction.     Austin [name]

## 2017-07-18 NOTE — Op Note (Signed)
07/18/2017  3:21 PM  PATIENT:  Austin [name]  84 y.o. male  PRE-OPERATIVE DIAGNOSIS:  Left hip femoral neck fracture  POST-OPERATIVE DIAGNOSIS:  Left hip femoral neck fracture  PROCEDURE:  Procedure(s): CANNULATED HIP PINNING (Left)-27235  Implants titanium screws from the Depuy cannulated screw set x3 with 3 washers  Surgery was done in the following manner  Patient was identified in the preop area surgical site was marked and confirmed left hip  Chart review was completed patient was taken to the operating room for general anesthesia.  He was given Ancef as a preop antibiotic  He was placed on the fracture table.  The right leg was placed in the well leg holder with padding.  Left leg was placed in traction.  The C-arm was brought in and radiographs were obtained.  The fracture was well reduced slight internal rotation was performed to improve the reduction.  The leg was prepped and draped sterilely and then timeout was completed  The incision was made just distal to the greater trochanter divided through subcutaneous tissue through the fascia and down to the vastus lateralis which was split in line with the skin incision.  Subperiosteal dissection exposed the proximal femur and then 3 pins were placed in a inverted triangle fashion.  This was confirmed in AP and lateral plane.  Once the pins were in good position each pin was individually measured.  The outer cortex was drilled and screws partially threaded with washers were placed  X-rays confirmed excellent position of all 3 screws and maintenance of fracture reduction.  The wound was irrigated hemostasis was obtained and we closed with 0 Monocryl for the vastus lateralis #1 Surgilon for the fascia and 0 Monocryl for the subcutaneous tissue.  I then injected 60 cc of Marcaine with epinephrine and placed a sterile dressing  Postop plan weightbearing as tolerated  Staples out at postop day 14  Patient can resume Coumadin in  24 hours    Assisted by Simonne Maffucci SURGEON:  Surgeon(s) and Role:    * Carole Civil, MD - Primary   EBL:  100 mL   BLOOD ADMINISTERED:none  DRAINS: none   LOCAL MEDICATIONS USED:  MARCAINE     SPECIMEN:  No Specimen  DISPOSITION OF SPECIMEN:  N/A  COUNTS:  YES  TOURNIQUET:  * No tourniquets in log *  DICTATION: .Dragon Dictation  PLAN OF CARE: Admit to inpatient   PATIENT DISPOSITION:  PACU - hemodynamically stable.   Delay start of Pharmacological VTE agent (>24hrs) due to surgical blood loss or risk of bleeding: no

## 2017-07-18 NOTE — Brief Op Note (Signed)
07/18/2017  3:21 PM  PATIENT:  Austin [name]  84 y.o. male  PRE-OPERATIVE DIAGNOSIS:  Left hip femoral neck fracture  POST-OPERATIVE DIAGNOSIS:  Left hip femoral neck fracture  PROCEDURE:  Procedure(s): CANNULATED HIP PINNING (Left)-27235  Implants titanium screws from the Depuy cannulated screw set x3 with 3 washers  Surgery was done in the following manner  Patient was identified in the preop area surgical site was marked and confirmed left hip  Chart review was completed patient was taken to the operating room for general anesthesia.  He was given Ancef as a preop antibiotic  He was placed on the fracture table.  The right leg was placed in the well leg holder with padding.  Left leg was placed in traction.  The C-arm was brought in and radiographs were obtained.  The fracture was well reduced slight internal rotation was performed to improve the reduction.  The leg was prepped and draped sterilely and then timeout was completed  The incision was made just distal to the greater trochanter divided through subcutaneous tissue through the fascia and down to the vastus lateralis which was split in line with the skin incision.  Subperiosteal dissection exposed the proximal femur and then 3 pins were placed in a inverted triangle fashion.  This was confirmed in AP and lateral plane.  Once the pins were in good position each pin was individually measured.  The outer cortex was drilled and screws partially threaded with washers were placed  X-rays confirmed excellent position of all 3 screws and maintenance of fracture reduction.  The wound was irrigated hemostasis was obtained and we closed with 0 Monocryl for the vastus lateralis #1 Surgilon for the fascia and 0 Monocryl for the subcutaneous tissue.  I then injected 60 cc of Marcaine with epinephrine and placed a sterile dressing  Postop plan weightbearing as tolerated  Staples out at postop day 14  Patient can resume Coumadin in  24 hours    Assisted by Simonne Maffucci SURGEON:  Surgeon(s) and Role:    * Carole Civil, MD - Primary   EBL:  100 mL   BLOOD ADMINISTERED:none  DRAINS: none   LOCAL MEDICATIONS USED:  MARCAINE     SPECIMEN:  No Specimen  DISPOSITION OF SPECIMEN:  N/A  COUNTS:  YES  TOURNIQUET:  * No tourniquets in log *  DICTATION: .Dragon Dictation  PLAN OF CARE: Admit to inpatient   PATIENT DISPOSITION:  PACU - hemodynamically stable.   Delay start of Pharmacological VTE agent (>24hrs) due to surgical blood loss or risk of bleeding: no

## 2017-07-18 NOTE — Anesthesia Postprocedure Evaluation (Signed)
Anesthesia Post Note  Patient: Austin [name]  Procedure(s) Performed: CANNULATED HIP PINNING (Left Hip)  Patient location during evaluation: PACU Anesthesia Type: General Level of consciousness: awake and alert Pain management: pain level controlled Vital Signs Assessment: post-procedure vital signs reviewed and stable Respiratory status: spontaneous breathing, nonlabored ventilation, respiratory function stable and patient connected to nasal cannula oxygen Cardiovascular status: blood pressure returned to baseline and stable Postop Assessment: no apparent nausea or vomiting Anesthetic complications: no     Last Vitals:  Vitals:   07/18/17 1250 07/18/17 1255  BP:    Pulse:    Resp: (!) 21 20  Temp:    SpO2: 96% 95%    Last Pain:  Vitals:   07/18/17 1145  TempSrc: Oral  PainSc: 0-No pain                 Nicanor Alcon

## 2017-07-18 NOTE — H&P (View-Only) (Signed)
Patient ID: Austin [name], male   DOB: 1932/04/14, 84 y.o.   MRN: [id number]  Hospital day 2  Left hip fracture  Patient's INR is 1.8 after vitamin K  After 2 units his  hemoglobin is 10.7  Discussed with Dr. Rick Duff,  recommended FFP be available so that was ordered  Should be able to proceed with internal fixation today

## 2017-07-18 NOTE — Progress Notes (Signed)
Patient ID: Austin [name], male   DOB: Aug 08, 1932, 84 y.o.   MRN: [id number]  Hospital day 2  Left hip fracture  Patient's INR is 1.8 after vitamin K  After 2 units his  hemoglobin is 10.7  Discussed with Dr. Rick Duff,  recommended FFP be available so that was ordered  Should be able to proceed with internal fixation today
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
## 2017-07-19 ENCOUNTER — Encounter (HOSPITAL_COMMUNITY): Payer: Self-pay | Admitting: Orthopedic Surgery

## 2017-07-19 ENCOUNTER — Inpatient Hospital Stay
Admission: RE | Admit: 2017-07-19 | Discharge: 2017-08-17 | Disposition: A | Discharge status: Nursing Home (Includes ICF) | Payer: Medicare Other | Source: Ambulatory Visit | Attending: Internal Medicine | Admitting: Internal Medicine

## 2017-07-19 DIAGNOSIS — Z86718 Personal history of other venous thrombosis and embolism: Secondary | ICD-10-CM | POA: Diagnosis not present

## 2017-07-19 DIAGNOSIS — K812 Acute cholecystitis with chronic cholecystitis: Principal | ICD-10-CM

## 2017-07-19 DIAGNOSIS — E785 Hyperlipidemia, unspecified: Secondary | ICD-10-CM | POA: Diagnosis not present

## 2017-07-19 DIAGNOSIS — K810 Acute cholecystitis: Secondary | ICD-10-CM | POA: Diagnosis not present

## 2017-07-19 DIAGNOSIS — I480 Paroxysmal atrial fibrillation: Secondary | ICD-10-CM | POA: Diagnosis not present

## 2017-07-19 DIAGNOSIS — I10 Essential (primary) hypertension: Secondary | ICD-10-CM | POA: Diagnosis not present

## 2017-07-19 DIAGNOSIS — R195 Other fecal abnormalities: Secondary | ICD-10-CM | POA: Diagnosis not present

## 2017-07-19 DIAGNOSIS — S72002D Fracture of unspecified part of neck of left femur, subsequent encounter for closed fracture with routine healing: Secondary | ICD-10-CM | POA: Diagnosis not present

## 2017-07-19 DIAGNOSIS — R262 Difficulty in walking, not elsewhere classified: Secondary | ICD-10-CM | POA: Diagnosis not present

## 2017-07-19 DIAGNOSIS — D649 Anemia, unspecified: Secondary | ICD-10-CM | POA: Diagnosis not present

## 2017-07-19 DIAGNOSIS — Z743 Need for continuous supervision: Secondary | ICD-10-CM | POA: Diagnosis not present

## 2017-07-19 DIAGNOSIS — N183 Chronic kidney disease, stage 3 (moderate): Secondary | ICD-10-CM | POA: Diagnosis not present

## 2017-07-19 DIAGNOSIS — K811 Chronic cholecystitis: Secondary | ICD-10-CM | POA: Diagnosis not present

## 2017-07-19 DIAGNOSIS — M6281 Muscle weakness (generalized): Secondary | ICD-10-CM | POA: Diagnosis not present

## 2017-07-19 DIAGNOSIS — Z48815 Encounter for surgical aftercare following surgery on the digestive system: Secondary | ICD-10-CM | POA: Diagnosis not present

## 2017-07-19 DIAGNOSIS — I129 Hypertensive chronic kidney disease with stage 1 through stage 4 chronic kidney disease, or unspecified chronic kidney disease: Secondary | ICD-10-CM | POA: Diagnosis not present

## 2017-07-19 DIAGNOSIS — S72065D Nondisplaced articular fracture of head of left femur, subsequent encounter for closed fracture with routine healing: Secondary | ICD-10-CM | POA: Diagnosis not present

## 2017-07-19 DIAGNOSIS — G8114 Spastic hemiplegia affecting left nondominant side: Secondary | ICD-10-CM | POA: Diagnosis not present

## 2017-07-19 DIAGNOSIS — Z967 Presence of other bone and tendon implants: Secondary | ICD-10-CM | POA: Diagnosis not present

## 2017-07-19 DIAGNOSIS — Z4682 Encounter for fitting and adjustment of non-vascular catheter: Secondary | ICD-10-CM | POA: Diagnosis not present

## 2017-07-19 DIAGNOSIS — Z8781 Personal history of (healed) traumatic fracture: Secondary | ICD-10-CM | POA: Diagnosis not present

## 2017-07-19 DIAGNOSIS — I509 Heart failure, unspecified: Secondary | ICD-10-CM | POA: Diagnosis not present

## 2017-07-19 DIAGNOSIS — R279 Unspecified lack of coordination: Secondary | ICD-10-CM | POA: Diagnosis not present

## 2017-07-19 LAB — BASIC METABOLIC PANEL
Anion gap: 10 (ref 5–15)
BUN: 19 mg/dL (ref 8–23)
CO2: 24 mmol/L (ref 22–32)
Calcium: 8.4 mg/dL — ABNORMAL LOW (ref 8.9–10.3)
Chloride: 106 mmol/L (ref 98–111)
Creatinine, Ser: 1.39 mg/dL — ABNORMAL HIGH (ref 0.61–1.24)
GFR calc Af Amer: 53 mL/min — ABNORMAL LOW (ref >60)
GFR calc non Af Amer: 46 mL/min — ABNORMAL LOW (ref >60)
Glucose, Bld: 107 mg/dL — ABNORMAL HIGH (ref 70–99)
Potassium: 3.7 mmol/L (ref 3.5–5.1)
Sodium: 140 mmol/L (ref 135–145)

## 2017-07-19 LAB — CBC
HCT: 35.1 % — ABNORMAL LOW (ref 39.0–52.0)
Hemoglobin: 11.3 g/dL — ABNORMAL LOW (ref 13.0–17.0)
MCH: 30.5 pg (ref 26.0–34.0)
MCHC: 32.2 g/dL (ref 30.0–36.0)
MCV: 94.6 fL (ref 78.0–100.0)
Platelets: 159 10*3/uL (ref 150–400)
RBC: 3.71 MIL/uL — ABNORMAL LOW (ref 4.22–5.81)
RDW: 16.6 % — ABNORMAL HIGH (ref 11.5–15.5)
WBC: 9.9 10*3/uL (ref 4.0–10.5)

## 2017-07-19 LAB — PROTIME-INR
INR: 1.22
Prothrombin Time: 15.3 seconds — ABNORMAL HIGH (ref 11.4–15.2)

## 2017-07-19 MED ORDER — DOCUSATE SODIUM 100 MG PO CAPS: 100.0000 mg | ORAL_CAPSULE | Freq: Two times a day (BID) | ORAL | 0 refills | Status: DC

## 2017-07-19 MED ORDER — HYDROCODONE-ACETAMINOPHEN 5-325 MG PO TABS: 1.0000 | ORAL_TABLET | Freq: Four times a day (QID) | ORAL | 0 refills | Status: AC | PRN

## 2017-07-19 MED ORDER — PANTOPRAZOLE SODIUM 40 MG PO PACK
40.0000 mg | PACK | Freq: Every day | ORAL | Status: DC
  Administered 2017-07-19: 40 mg via ORAL
  Filled 2017-07-19: qty 20

## 2017-07-19 MED ORDER — WARFARIN - PHARMACIST DOSING INPATIENT: Freq: Every day | Status: DC

## 2017-07-19 MED ORDER — WARFARIN SODIUM 7.5 MG PO TABS
7.5000 mg | ORAL_TABLET | Freq: Once | ORAL | Status: AC
  Administered 2017-07-19: 7.5 mg via ORAL
  Filled 2017-07-19: qty 1
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
## 2017-07-19 NOTE — Consult Note (Signed)
   [facility] Inpatient Consult   07/19/2017  Austin [name] 03/19/1932 [id number]   Patient is currently active with [facility] for chronic disease management services.  Patient has been engaged by a [employer] and Post acute liaison.  Our community based plan of care has focused on disease management and community resource support.  Patient will receive a post discharge visit with [place] Worker to assist with discharge planning needs from SNF.  Made Inpatient Social Worker aware that [facility] Management following. Of note, [facility] Care Management services does not replace or interfere with any services that are needed or arranged by inpatient case management or social work.  For additional questions or referrals please contact:  Lalania Haseman RN, [facility] Liaison  [phone]) [place] [phone]) Toll free office

## 2017-07-19 NOTE — Progress Notes (Signed)
Report called to [place].  Pt transferred via staff

## 2017-07-19 NOTE — Progress Notes (Signed)
ANTICOAGULATION CONSULT NOTE - Pharmacy Consult for warfarin Indication: history of DVT  No Known Allergies  Patient Measurements: Height: 5\' 6"  (167.6 cm) Weight: 184 lb 1.4 oz (83.5 kg) IBW/kg (Calculated) : 63.8   Vital Signs: Temp: 99.5 F (37.5 C) (06/27 0656) Temp Source: Oral (06/27 0656) BP: 155/77 (06/27 0656) Pulse Rate: 87 (06/27 0656)  Labs: Recent Labs    07/16/17 2043 07/17/17 0635 07/18/17 0436 07/19/17 0754  HGB 9.8* 8.0* 10.7* 11.3*  HCT 31.9* 26.3* 33.6* 35.1*  PLT 228 175 152 159  LABPROT 28.7* 30.7* 20.8* 15.3*  INR 2.73 2.97 1.80 1.22  CREATININE 1.52* 1.64*  --  1.39*    Estimated Creatinine Clearance: 41.6 mL/min (A) (by C-G formula based on SCr of 1.39 mg/dL (H)).   Medical History: Past Medical History:  Diagnosis Date  . ADENOCARCINOMA, PROSTATE, GLEASON GRADE 5 01/21/2009  . ALLERGIC RHINITIS 01/14/2007  . CKD (chronic kidney disease) stage 3, GFR 30-59 ml/min (HCC) 04/30/2015  . COLONIC POLYPS, HX OF 01/14/2007  . ELEVATED PROSTATE SPECIFIC ANTIGEN 03/27/2008  . ESOPHAGITIS 01/14/2007  . HYPERLIPIDEMIA 01/14/2007  . HYPERTENSION 01/14/2007  . LIBIDO, DECREASED 01/15/2007  . Paralysis ([place])    from [place] 02/2013    Medications:  Medications Prior to Admission  Medication Sig Dispense Refill Last Dose  . acetaminophen (TYLENOL) 650 MG CR tablet Take 650 mg by mouth every 8 (eight) hours as needed for pain.   unknown  . Cholecalciferol (VITAMIN D) 2000 units CAPS Take 1 capsule by mouth daily.   07/16/2017 at Unknown time  . furosemide (LASIX) 20 MG tablet Take 20 mg by mouth daily.   07/16/2017 at Unknown time  . gabapentin (NEURONTIN) 100 MG capsule Take 100 mg by mouth 2 (two) times daily.   07/16/2017 at Unknown time  . gabapentin (NEURONTIN) 300 MG capsule Take 300 mg by mouth at bedtime.    07/15/2017 at Unknown time  . Iron-Vitamins (GERITOL) LIQD Take 15 mLs by mouth 3 (three) times daily.   07/16/2017 at Unknown time  . Misc Natural  Products (TART CHERRY ADVANCED PO) Take 1 capsule by mouth daily.    07/16/2017 at Unknown time  . omeprazole (PRILOSEC) 40 MG capsule Take 40 mg by mouth daily.    07/16/2017 at Unknown time  . rosuvastatin (CRESTOR) 10 MG tablet Take 10 mg by mouth daily.   07/16/2017 at Unknown time  . warfarin (COUMADIN) 5 MG tablet TAKE 1 & 1/2 TABLET BY MOUTH DAILY. 60 tablet 3 07/15/2017 at 2100  . Nutritional Supplements (FEEDING SUPPLEMENT, NEPRO CARB STEADY,) LIQD Take 237 mLs by mouth 2 (two) times daily between meals. (Patient not taking: Reported on 07/16/2017)  0 Not Taking    Assessment: Pharmacy consulted to dose warfarin in patient with history of DVT.  Patient had surgery for nondisplaced left femoral neck fracture.  Patient's home dose of warfarin is 7.5 mg daily.  Goal of Therapy:  INR 2-3 Monitor platelets by anticoagulation protocol: Yes   Plan:  Warfarin 7.5 mg x 1 dose Monitor INR and s/s of bleeding   Margot Ables, PharmD Clinical Pharmacist 07/19/2017 9:06 AM
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
## 2017-07-19 NOTE — Clinical Social Work Placement (Signed)
   CLINICAL SOCIAL WORK PLACEMENT  NOTE  Date:  07/19/2017  Patient Details  Name: Austin [name] MRN: [id number] Date of Birth: 05-01-1932  Clinical Social Work is seeking post-discharge placement for this patient at the [place] level of care (*CSW will initial, date and re-position this form in  chart as items are completed):  Yes   Patient/family provided with [facility] Work Department's list of facilities offering this level of care within the geographic area requested by the patient (or if unable, by the patient's family).  Yes   Patient/family informed of their freedom to choose among providers that offer the needed level of care, that participate in Medicare, Medicaid or managed care program needed by the patient, have an available bed and are willing to accept the patient.  Yes   Patient/family informed of [facility]'s ownership interest in [facility] and [facility], as well as of the fact that they are under no obligation to receive care at these facilities.  PASRR submitted to EDS on       PASRR number received on       Existing PASRR number confirmed on 07/18/17     FL2 transmitted to all facilities in geographic area requested by pt/family on 07/18/17     FL2 transmitted to all facilities within larger geographic area on       Patient informed that his/her managed care company has contracts with or will negotiate with certain facilities, including the following:        Yes   Patient/family informed of bed offers received.  Patient chooses bed at [facility]     Physician recommends and patient chooses bed at      Patient to be transferred to [facility] on 07/19/17.  Patient to be transferred to facility by [facility] staff     Patient family notified on 07/19/17 of transfer.  Name of family member notified:  message left for spouse.      PHYSICIAN       Additional Comment:  Discharge clinicals sent  to facility and message left for Keri advising of discharge. LcSw signing off.   _______________________________________________ Ihor Gully, LCSW 07/19/2017, 4:22 PM

## 2017-07-19 NOTE — Progress Notes (Signed)
Patient ID: Austin [name], male   DOB: 12-06-1932, 84 y.o.   MRN: [id number]  Postoperative day #1 status post cannulated screw fixation left hip fracture  Austin [name] is in good spirits he is awake and alert he has good pain control  His dressing has scant sanguinous drainage.  There is no change in the neurovascular status of his left lower extremity which was hemiparetic.  CBC Latest Ref Rng & Units 07/19/2017 07/18/2017 07/17/2017  WBC 4.0 - 10.5 K/uL 9.9 7.9 7.0  Hemoglobin 13.0 - 17.0 g/dL 11.3(L) 10.7(L) 8.0(L)  Hematocrit 39.0 - 52.0 % 35.1(L) 33.6(L) 26.3(L)  Platelets 150 - 400 K/uL 159 152 175    BMP Latest Ref Rng & Units 07/19/2017 07/17/2017 07/16/2017  Glucose 70 - 99 mg/dL 107(H) 98 131(H)  BUN 8 - 23 mg/dL 19 19 18   Creatinine 0.61 - 1.24 mg/dL 1.39(H) 1.64(H) 1.52(H)  Sodium 135 - 145 mmol/L 140 144 145  Potassium 3.5 - 5.1 mmol/L 3.7 3.7 3.7  Chloride 98 - 111 mmol/L 106 111 110  CO2 22 - 32 mmol/L 24 26 25   Calcium 8.9 - 10.3 mg/dL 8.4(L) 8.5(L) 8.9    Hemoglobin today 11.3 pharmacy will control his INR and Coumadin for now  Start therapy.  Currently in stable condition

## 2017-07-20 ENCOUNTER — Non-Acute Institutional Stay (SKILLED_NURSING_FACILITY): Payer: Medicare Other | Admitting: Internal Medicine

## 2017-07-20 ENCOUNTER — Telehealth: Payer: Self-pay | Admitting: *Deleted

## 2017-07-20 ENCOUNTER — Encounter (HOSPITAL_COMMUNITY)
Admission: RE | Admit: 2017-07-20 | Discharge: 2017-07-20 | Disposition: A | Discharge status: Home / Self Care | Payer: Medicare Other | Source: Skilled Nursing Facility | Attending: Internal Medicine | Admitting: Internal Medicine

## 2017-07-20 ENCOUNTER — Encounter: Payer: Self-pay | Admitting: Internal Medicine

## 2017-07-20 DIAGNOSIS — Z86718 Personal history of other venous thrombosis and embolism: Secondary | ICD-10-CM | POA: Diagnosis not present

## 2017-07-20 DIAGNOSIS — I10 Essential (primary) hypertension: Secondary | ICD-10-CM

## 2017-07-20 DIAGNOSIS — S72002D Fracture of unspecified part of neck of left femur, subsequent encounter for closed fracture with routine healing: Secondary | ICD-10-CM

## 2017-07-20 DIAGNOSIS — N183 Chronic kidney disease, stage 3 unspecified: Secondary | ICD-10-CM

## 2017-07-20 DIAGNOSIS — K811 Chronic cholecystitis: Secondary | ICD-10-CM

## 2017-07-20 DIAGNOSIS — G8114 Spastic hemiplegia affecting left nondominant side: Secondary | ICD-10-CM | POA: Diagnosis not present

## 2017-07-20 LAB — CBC WITH DIFFERENTIAL/PLATELET
Basophils Absolute: 0 10*3/uL (ref 0.0–0.1)
Basophils Relative: 0 %
Eosinophils Absolute: 0.4 10*3/uL (ref 0.0–0.7)
Eosinophils Relative: 4 %
HCT: 31.9 % — ABNORMAL LOW (ref 39.0–52.0)
Hemoglobin: 10.2 g/dL — ABNORMAL LOW (ref 13.0–17.0)
Lymphocytes Relative: 13 %
Lymphs Abs: 1.1 10*3/uL (ref 0.7–4.0)
MCH: 30.4 pg (ref 26.0–34.0)
MCHC: 32 g/dL (ref 30.0–36.0)
MCV: 95.2 fL (ref 78.0–100.0)
Monocytes Absolute: 1.7 10*3/uL — ABNORMAL HIGH (ref 0.1–1.0)
Monocytes Relative: 19 %
Neutro Abs: 5.6 10*3/uL (ref 1.7–7.7)
Neutrophils Relative %: 64 %
Platelets: 154 10*3/uL (ref 150–400)
RBC: 3.35 MIL/uL — ABNORMAL LOW (ref 4.22–5.81)
RDW: 16.5 % — ABNORMAL HIGH (ref 11.5–15.5)
WBC: 8.8 10*3/uL (ref 4.0–10.5)

## 2017-07-20 LAB — PROTIME-INR
INR: 1.22
Prothrombin Time: 15.3 seconds — ABNORMAL HIGH (ref 11.4–15.2)

## 2017-07-20 LAB — BASIC METABOLIC PANEL
Anion gap: 6 (ref 5–15)
BUN: 20 mg/dL (ref 8–23)
CO2: 25 mmol/L (ref 22–32)
Calcium: 8.1 mg/dL — ABNORMAL LOW (ref 8.9–10.3)
Chloride: 107 mmol/L (ref 98–111)
Creatinine, Ser: 1.5 mg/dL — ABNORMAL HIGH (ref 0.61–1.24)
GFR calc Af Amer: 48 mL/min — ABNORMAL LOW (ref >60)
GFR calc non Af Amer: 42 mL/min — ABNORMAL LOW (ref >60)
Glucose, Bld: 106 mg/dL — ABNORMAL HIGH (ref 70–99)
Potassium: 3.6 mmol/L (ref 3.5–5.1)
Sodium: 138 mmol/L (ref 135–145)
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
## 2017-07-20 NOTE — Telephone Encounter (Signed)
Pt was on the TCM report admitted 07/16/17 for Hip fracture, unspecified laterality.CT of the head negative for any acute findings and left pelvic/hip films seem to demonstrate an impacted subcapital left femoral neck fracture that is nondisplaced. Coumadin restarted and with goal of INR 2-3 PT/OT has seen patient and recommending SNF for rehab. Pt D/C to SNF 07/19/16, per d/c summary will need to follow-up w/PCP 2 weeks after discharging from skill nursing.Marland KitchenJohny Chess

## 2017-07-21 ENCOUNTER — Encounter: Payer: Self-pay | Admitting: Internal Medicine

## 2017-07-21 ENCOUNTER — Non-Acute Institutional Stay (SKILLED_NURSING_FACILITY): Payer: Medicare Other | Admitting: Internal Medicine

## 2017-07-21 ENCOUNTER — Encounter (HOSPITAL_COMMUNITY)
Admission: RE | Admit: 2017-07-21 | Discharge: 2017-07-21 | Disposition: A | Discharge status: Home / Self Care | Payer: Medicare Other | Source: Skilled Nursing Facility

## 2017-07-21 DIAGNOSIS — R195 Other fecal abnormalities: Secondary | ICD-10-CM | POA: Diagnosis not present

## 2017-07-21 DIAGNOSIS — S72002D Fracture of unspecified part of neck of left femur, subsequent encounter for closed fracture with routine healing: Secondary | ICD-10-CM

## 2017-07-21 DIAGNOSIS — Z86718 Personal history of other venous thrombosis and embolism: Secondary | ICD-10-CM | POA: Diagnosis not present

## 2017-07-21 DIAGNOSIS — I509 Heart failure, unspecified: Secondary | ICD-10-CM | POA: Diagnosis not present

## 2017-07-21 LAB — OCCULT BLOOD X 1 CARD TO LAB, STOOL: Fecal Occult Bld: POSITIVE — AB
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
## 2017-07-23 ENCOUNTER — Encounter (HOSPITAL_COMMUNITY)
Admission: RE | Admit: 2017-07-23 | Discharge: 2017-07-23 | Disposition: A | Discharge status: Home / Self Care | Payer: Medicare Other | Source: Skilled Nursing Facility | Attending: Internal Medicine | Admitting: Internal Medicine

## 2017-07-23 ENCOUNTER — Non-Acute Institutional Stay (SKILLED_NURSING_FACILITY): Payer: Medicare Other | Admitting: Internal Medicine

## 2017-07-23 ENCOUNTER — Encounter: Payer: Self-pay | Admitting: Internal Medicine

## 2017-07-23 DIAGNOSIS — D649 Anemia, unspecified: Secondary | ICD-10-CM

## 2017-07-23 DIAGNOSIS — N183 Chronic kidney disease, stage 3 unspecified: Secondary | ICD-10-CM

## 2017-07-23 DIAGNOSIS — K811 Chronic cholecystitis: Secondary | ICD-10-CM

## 2017-07-23 DIAGNOSIS — S72002D Fracture of unspecified part of neck of left femur, subsequent encounter for closed fracture with routine healing: Secondary | ICD-10-CM

## 2017-07-23 LAB — BASIC METABOLIC PANEL
Anion gap: 7 (ref 5–15)
BUN: 24 mg/dL — ABNORMAL HIGH (ref 8–23)
CO2: 26 mmol/L (ref 22–32)
Calcium: 8.6 mg/dL — ABNORMAL LOW (ref 8.9–10.3)
Chloride: 106 mmol/L (ref 98–111)
Creatinine, Ser: 1.41 mg/dL — ABNORMAL HIGH (ref 0.61–1.24)
GFR calc Af Amer: 52 mL/min — ABNORMAL LOW (ref >60)
GFR calc non Af Amer: 45 mL/min — ABNORMAL LOW (ref >60)
Glucose, Bld: 88 mg/dL (ref 70–99)
Potassium: 4 mmol/L (ref 3.5–5.1)
Sodium: 139 mmol/L (ref 135–145)

## 2017-07-23 LAB — CBC WITH DIFFERENTIAL/PLATELET
Basophils Absolute: 0 10*3/uL (ref 0.0–0.1)
Basophils Relative: 0 %
Eosinophils Absolute: 0.6 10*3/uL (ref 0.0–0.7)
Eosinophils Relative: 8 %
HCT: 31.4 % — ABNORMAL LOW (ref 39.0–52.0)
Hemoglobin: 10 g/dL — ABNORMAL LOW (ref 13.0–17.0)
Lymphocytes Relative: 16 %
Lymphs Abs: 1.3 10*3/uL (ref 0.7–4.0)
MCH: 30.3 pg (ref 26.0–34.0)
MCHC: 31.8 g/dL (ref 30.0–36.0)
MCV: 95.2 fL (ref 78.0–100.0)
Monocytes Absolute: 0.9 10*3/uL (ref 0.1–1.0)
Monocytes Relative: 12 %
Neutro Abs: 5 10*3/uL (ref 1.7–7.7)
Neutrophils Relative %: 64 %
Platelets: 199 10*3/uL (ref 150–400)
RBC: 3.3 MIL/uL — ABNORMAL LOW (ref 4.22–5.81)
RDW: 15.3 % (ref 11.5–15.5)
WBC: 7.8 10*3/uL (ref 4.0–10.5)

## 2017-07-23 LAB — PROTIME-INR
INR: 1.18
Prothrombin Time: 14.9 seconds (ref 11.4–15.2)

## 2017-07-23 NOTE — Assessment & Plan Note (Signed)
Laparoscopic cholecystectomy planned 08/17/17 by Dr. Hulen Skains

## 2017-07-23 NOTE — Assessment & Plan Note (Signed)
Creatinine relatively stable to improving

## 2017-07-23 NOTE — Patient Instructions (Signed)
See assessment and plan under each diagnosis in the problem list and acutely for this visit 
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
## 2017-07-23 NOTE — Assessment & Plan Note (Signed)
PT/OT at SNF  Orthopedic follow-up as scheduled  

## 2017-07-24 ENCOUNTER — Encounter (HOSPITAL_COMMUNITY)
Admission: RE | Admit: 2017-07-24 | Discharge: 2017-07-24 | Disposition: A | Discharge status: Home / Self Care | Payer: Medicare Other | Source: Skilled Nursing Facility | Attending: Internal Medicine | Admitting: Internal Medicine

## 2017-07-24 LAB — BPAM FFP
Blood Product Expiration Date: 201907012359
Unit Type and Rh: 600

## 2017-07-24 LAB — PROTIME-INR
INR: 1.21
Prothrombin Time: 15.2 seconds (ref 11.4–15.2)

## 2017-07-24 LAB — PREPARE FRESH FROZEN PLASMA: Unit division: 0

## 2017-07-24 LAB — OCCULT BLOOD X 1 CARD TO LAB, STOOL: Fecal Occult Bld: NEGATIVE

## 2017-07-25 ENCOUNTER — Other Ambulatory Visit (HOSPITAL_COMMUNITY)
Admission: RE | Admit: 2017-07-25 | Discharge: 2017-07-25 | Disposition: A | Discharge status: Home / Self Care | Payer: Medicare Other | Source: Skilled Nursing Facility | Attending: Internal Medicine | Admitting: Internal Medicine

## 2017-07-25 ENCOUNTER — Ambulatory Visit (HOSPITAL_COMMUNITY): Payer: Medicare Other

## 2017-07-25 DIAGNOSIS — S72065D Nondisplaced articular fracture of head of left femur, subsequent encounter for closed fracture with routine healing: Secondary | ICD-10-CM | POA: Insufficient documentation

## 2017-07-25 DIAGNOSIS — Z7901 Long term (current) use of anticoagulants: Secondary | ICD-10-CM | POA: Insufficient documentation

## 2017-07-25 DIAGNOSIS — Z9181 History of falling: Secondary | ICD-10-CM | POA: Insufficient documentation

## 2017-07-25 DIAGNOSIS — X58XXXD Exposure to other specified factors, subsequent encounter: Secondary | ICD-10-CM | POA: Insufficient documentation

## 2017-07-25 LAB — PROTIME-INR
INR: 1.31
Prothrombin Time: 16.2 seconds — ABNORMAL HIGH (ref 11.4–15.2)

## 2017-07-25 LAB — OCCULT BLOOD X 1 CARD TO LAB, STOOL: Fecal Occult Bld: NEGATIVE

## 2017-07-26 ENCOUNTER — Other Ambulatory Visit (HOSPITAL_COMMUNITY)
Admission: RE | Admit: 2017-07-26 | Discharge: 2017-07-26 | Disposition: A | Discharge status: Home / Self Care | Payer: Medicare Other | Source: Skilled Nursing Facility | Attending: Internal Medicine | Admitting: Internal Medicine

## 2017-07-26 DIAGNOSIS — Z7901 Long term (current) use of anticoagulants: Secondary | ICD-10-CM | POA: Insufficient documentation

## 2017-07-26 LAB — PROTIME-INR
INR: 1.46
Prothrombin Time: 17.6 seconds — ABNORMAL HIGH (ref 11.4–15.2)

## 2017-07-27 ENCOUNTER — Other Ambulatory Visit: Payer: Self-pay | Admitting: *Deleted

## 2017-07-27 ENCOUNTER — Encounter (HOSPITAL_COMMUNITY)
Admission: RE | Admit: 2017-07-27 | Discharge: 2017-07-27 | Disposition: A | Discharge status: Home / Self Care | Payer: Medicare Other | Source: Skilled Nursing Facility | Attending: *Deleted | Admitting: *Deleted

## 2017-07-27 ENCOUNTER — Ambulatory Visit (HOSPITAL_COMMUNITY)
Admit: 2017-07-27 | Discharge: 2017-07-27 | DRG: 949 | Disposition: A | Discharge status: Home / Self Care | Payer: Medicare Other | Source: Skilled Nursing Facility | Attending: Surgery | Admitting: Surgery

## 2017-07-27 ENCOUNTER — Encounter (HOSPITAL_COMMUNITY): Payer: Self-pay | Admitting: Interventional Radiology

## 2017-07-27 DIAGNOSIS — K810 Acute cholecystitis: Secondary | ICD-10-CM | POA: Diagnosis present

## 2017-07-27 DIAGNOSIS — Z4682 Encounter for fitting and adjustment of non-vascular catheter: Secondary | ICD-10-CM

## 2017-07-27 HISTORY — PX: IR EXCHANGE BILIARY DRAIN: IMG6046

## 2017-07-27 LAB — PROTIME-INR
INR: 1.64
Prothrombin Time: 19.2 seconds — ABNORMAL HIGH (ref 11.4–15.2)

## 2017-07-27 MED ORDER — LIDOCAINE HCL (PF) 1 % IJ SOLN
INTRAMUSCULAR | Status: DC | PRN
  Administered 2017-07-27: 5 mL

## 2017-07-27 MED ORDER — LIDOCAINE HCL 1 % IJ SOLN
INTRAMUSCULAR | Status: AC
  Filled 2017-07-27: qty 20

## 2017-07-27 MED ORDER — IOPAMIDOL (ISOVUE-300) INJECTION 61%
INTRAVENOUS | Status: AC
  Administered 2017-07-27: 10 mL
  Filled 2017-07-27: qty 50

## 2017-07-27 NOTE — Patient Outreach (Signed)
[name]) Care Management  07/27/2017  Austin [name] 1932/09/24 [id number]   Met with patient and wife at bedside of facility.  Patient has an appointment this afternoon regarding upcoming surgery. Patient is working with therapy at the facility.   Both patient and wife confirm that they want to continue to participate with [facility] services upon discharge from SNF.  Unable to meet with facility SW this site visit.   Plan to update [facility] team on discharge planning needs.  Royetta Crochet. [name], RN, BSN, [place] [phone]) Business Cell  [phone]) Toll Free Office

## 2017-07-27 NOTE — Procedures (Signed)
10 Fr GB drain exchange EBL 0 Comp 0

## 2017-07-28 ENCOUNTER — Encounter (HOSPITAL_COMMUNITY)
Admission: RE | Admit: 2017-07-28 | Discharge: 2017-07-28 | Disposition: A | Discharge status: Home / Self Care | Payer: Medicare Other | Source: Skilled Nursing Facility

## 2017-07-28 LAB — PROTIME-INR
INR: 1.84
Prothrombin Time: 21.1 seconds — ABNORMAL HIGH (ref 11.4–15.2)

## 2017-07-29 ENCOUNTER — Encounter (HOSPITAL_COMMUNITY)
Admission: RE | Admit: 2017-07-29 | Discharge: 2017-07-29 | Disposition: A | Discharge status: Home / Self Care | Payer: Medicare Other | Source: Skilled Nursing Facility | Attending: Internal Medicine | Admitting: Internal Medicine

## 2017-07-29 LAB — PROTIME-INR
INR: 2.1
Prothrombin Time: 23.4 seconds — ABNORMAL HIGH (ref 11.4–15.2)

## 2017-07-30 ENCOUNTER — Encounter (HOSPITAL_COMMUNITY)
Admission: RE | Admit: 2017-07-30 | Discharge: 2017-07-30 | Disposition: A | Discharge status: Home / Self Care | Payer: Medicare Other | Source: Skilled Nursing Facility | Attending: *Deleted | Admitting: *Deleted

## 2017-07-30 LAB — PROTIME-INR
INR: 2.09
Prothrombin Time: 23.3 seconds — ABNORMAL HIGH (ref 11.4–15.2)

## 2017-07-31 ENCOUNTER — Other Ambulatory Visit: Payer: Self-pay | Admitting: *Deleted

## 2017-07-31 ENCOUNTER — Encounter: Payer: Self-pay | Admitting: Internal Medicine

## 2017-07-31 ENCOUNTER — Encounter (HOSPITAL_COMMUNITY)
Admission: RE | Admit: 2017-07-31 | Discharge: 2017-07-31 | Disposition: A | Discharge status: Home / Self Care | Payer: Medicare Other | Source: Skilled Nursing Facility | Attending: Internal Medicine | Admitting: Internal Medicine

## 2017-07-31 DIAGNOSIS — E785 Hyperlipidemia, unspecified: Secondary | ICD-10-CM | POA: Insufficient documentation

## 2017-07-31 DIAGNOSIS — S72065D Nondisplaced articular fracture of head of left femur, subsequent encounter for closed fracture with routine healing: Secondary | ICD-10-CM | POA: Insufficient documentation

## 2017-07-31 DIAGNOSIS — Z9181 History of falling: Secondary | ICD-10-CM | POA: Insufficient documentation

## 2017-07-31 DIAGNOSIS — Z7901 Long term (current) use of anticoagulants: Secondary | ICD-10-CM | POA: Insufficient documentation

## 2017-07-31 LAB — PROTIME-INR
INR: 2.11
Prothrombin Time: 23.5 seconds — ABNORMAL HIGH (ref 11.4–15.2)
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
## 2017-07-31 NOTE — Patient Outreach (Signed)
[facility]) Care Management  07/31/2017  Austin [name] 17-Dec-1932 [id number]   CSW received referral from [place], Merlene Morse that patient was admitted to [facility] on 07/19/17 from [place] met with patient & his wife, [name] at [facility] (room #: 119). Patient reports that he has been working with therapy with plans to return home with his wife eventually but understands theres a long road of recovery ahead of him. CSW noted hoyer lift pad under patient in the wheelchair & wife reports that they do not have a hoyer lift at home and hopes that he is able to regain his strength so they will not need one at home. Patient was previously at [facility] and discharged home where he fell. CSW provided patient/wife with [facility] packet & wife signed consent to be scanned into Epic. CSW also confirmed home address as [place] since only PO box is listed in epic. Patient's wife reports that they were very pleased with [facility] RNCM, Fele[place] and looks forward to having her follow patient upon discharge.  CSW will follow-up with patient/wife in 2 weeks and encouraged wife to call CSW if any changes before then.    Raynaldo Opitz, LCSW [facility]  Clinical Social Worker cell #: [phone]

## 2017-08-02 ENCOUNTER — Encounter: Payer: Self-pay | Admitting: Internal Medicine
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
## 2017-08-02 NOTE — Progress Notes (Deleted)
Provider:Anjali Lyndel Safe MD   Location:   [facility] Room Number: 161/W Place of Service:  SNF ([phone])  PCP: [name], MD Patient Care Team: [name], MD as PCP - General (Internal Medicine) Carolan Clines, MD as Attending Physician (Urology) Prentiss Bells, MD (Ophthalmology) Carole Civil, MD (Orthopedic Surgery) Essenmacher, Clarene Duke, OT as Occupational Therapist (Occupational Therapy) Lonna Cobb, PT (Inactive) as Physical Therapist (Physical Therapy) Susy Frizzle, PTA as Physical Therapy Assistant (Physical Therapy) Neldon Labella, RN as [place] Management Standley Brooking, LCSW as [place] Management (Licensed Clinical Social Worker)  Extended Emergency Contact Information Primary Emergency Contact: Williemae Area Address: [address] [place], [place] [address] [place] Phone: [phone] Work Phone: [phone] Mobile Phone: [phone] Relation: Spouse Secondary Emergency Contact: [place] of [place] Phone: [phone] Relation: Grandaughter  Code Status: DNR Goals of Care: Advanced Directive information Advanced Directives 08/02/2017  Does Patient Have a Medical Advance Directive? Yes  Type of Advance Directive Out of facility DNR (pink MOST or yellow form)  Does patient want to make changes to medical advance directive? No - Patient declined  Copy of [place] in Chart? No - copy requested  Some encounter information is confidential and restricted. Go to Review Flowsheets activity to see all data.      Chief Complaint  Patient presents with  . Acute Visit    Patient is being seen for F/U Visit    HPI: Patient is a 84 y.o. male seen today for admission to  Past Medical History:  Diagnosis Date  . ADENOCARCINOMA, PROSTATE, GLEASON GRADE 5 01/21/2009  . ALLERGIC RHINITIS 01/14/2007  .  CKD (chronic kidney disease) stage 3, GFR 30-59 ml/min (HCC) 04/30/2015  . COLONIC POLYPS, HX OF 01/14/2007  . ELEVATED PROSTATE SPECIFIC ANTIGEN 03/27/2008  . ESOPHAGITIS 01/14/2007  . HYPERLIPIDEMIA 01/14/2007  . HYPERTENSION 01/14/2007  . LIBIDO, DECREASED 01/15/2007  . Paralysis ([facility])    from [place] 02/2013   Past Surgical History:  Procedure Laterality Date  . ESOPHAGOGASTRODUODENOSCOPY N/A 08/15/2013   Procedure: ESOPHAGOGASTRODUODENOSCOPY (EGD);  Surgeon: Gwenyth Ober, MD;  Location: [facility];  Service: General;  Laterality: N/A;  . HERNIA REPAIR    . HIP PINNING,CANNULATED Left 07/18/2017   Procedure: CANNULATED HIP PINNING;  Surgeon: Carole Civil, MD;  Location: [facility];  Service: Orthopedics;  Laterality: Left;  . IR EXCHANGE BILIARY DRAIN  05/24/2017  . IR EXCHANGE BILIARY DRAIN  07/27/2017  . IR PERC CHOLECYSTOSTOMY  04/10/2017  . PEG PLACEMENT N/A 09/02/2013   Procedure: PERCUTANEOUS ENDOSCOPIC GASTROSTOMY (PEG) PLACEMENT;  Surgeon: Gwenyth Ober, MD;  Location: [facility];  Service: General;  Laterality: N/A;  . PROSTATE CRYOABLATION      reports that he has quit smoking. He has never used smokeless tobacco. He reports that he drank alcohol. He reports that he does not use drugs. Social History   Socioeconomic History  . Marital status: Married    Spouse name: Not on file  . Number of children: 3  . Years of education: 71  . Highest education level: Not on file  Occupational History  . Occupation: Forensic scientist: [employer]  Social Needs  . Financial resource strain: Not on file  . Food insecurity:    Worry: Not on file    Inability: Not on file  .  Transportation needs:    Medical: Not on file    Non-medical: Not on file  Tobacco Use  . Smoking status: Former Research scientist (life sciences)  . Smokeless tobacco: Never Used  Substance and Sexual Activity  . Alcohol use: Not Currently    Comment: rarely  . Drug use: No  . Sexual activity: Not Currently     Birth control/protection: None  Lifestyle  . Physical activity:    Days per week: Not on file    Minutes per session: Not on file  . Stress: Not on file  Relationships  . Social connections:    Talks on phone: Not on file    Gets together: Not on file    Attends religious service: Not on file    Active member of club or organization: Not on file    Attends meetings of clubs or organizations: Not on file    Relationship status: Not on file  . Intimate partner violence:    Fear of current or ex partner: Not on file    Emotionally abused: Not on file    Physically abused: Not on file    Forced sexual activity: Not on file  Other Topics Concern  . Not on file  Social History Narrative   ** Merged History Encounter **       Building services engineer. Married 1961. 2 sons- '63, '69 & 1 daughter '55   Grandchildren 5. 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 Working full time. Discussion needed in regard to Advance Care Planning-DNR/DNI, no artificial feeding or hydration, No HD, no heroic or futile.                 Functional Status Survey:    Family History  Problem Relation Age of Onset  . Lung cancer Father   . Hypertension Other   . Arthritis Unknown     Health Maintenance  Topic Date Due  . PNA vac Low Risk Adult (1 of 2 - PCV13) 08/19/2017 (Originally 01/23/2000)  . INFLUENZA VACCINE  08/23/2017  . TETANUS/TDAP  02/04/2022    No Known Allergies  Outpatient Encounter Medications as of 08/02/2017  Medication Sig  . acetaminophen (TYLENOL) 650 MG CR tablet Take 650 mg by mouth every 8 (eight) hours as needed for pain.  . Cholecalciferol (VITAMIN D) 2000 units CAPS Take 1 capsule by mouth daily.  . ferrous sulfate 220 (44 Fe) MG/5ML solution Take 220 mg by mouth daily.  . furosemide (LASIX) 20 MG tablet Take 20 mg by mouth daily.  [name] gabapentin (NEURONTIN) 100 MG capsule Take 100 mg by mouth 2 (two) times daily.  [name] gabapentin (NEURONTIN) 300 MG capsule Take 300 mg by mouth at bedtime.    . Iron-Vitamins (GERITOL) LIQD Take 15 mLs by mouth 3 (three) times daily.  . Misc Natural Products (TART CHERRY ADVANCED PO) Take 1 capsule by mouth daily.   . Nutritional Supplements (FEEDING SUPPLEMENT, NEPRO CARB STEADY,) LIQD Take 237 mLs by mouth 2 (two) times daily between meals.  [name] omeprazole (PRILOSEC) 40 MG capsule Take 40 mg by mouth daily.   . rosuvastatin (CRESTOR) 10 MG tablet Take 10 mg by mouth daily.  [name] senna (SENOKOT) 8.6 MG TABS tablet Take 1 tablet by mouth 2 (two) times daily.  [name] warfarin (COUMADIN) 7.5 MG tablet Take 7.5 mg by mouth daily.   No facility-administered encounter medications on file as of 08/02/2017.      Review of Systems  There were no vitals filed for this visit. There is no  height or weight on file to calculate BMI. Physical Exam  Labs reviewed: Basic Metabolic Panel: Recent Labs    04/10/17 0437  04/10/17 2306  04/25/17 0249 04/26/17 0442 04/27/17 0557  07/19/17 0754 07/20/17 0808 07/23/17 0415  NA 137   < > 138   < > 140 140 140   < > 140 138 139  K 3.6   < > 3.8   < > 3.4* 3.3* 3.4*   < > 3.7 3.6 4.0  CL 105   < > 104   < > 103 104 104   < > 106 107 106  CO2 15*   < > 18*   < > 22 22 22    < > 24 25 26   GLUCOSE 193*   < > 179*   < > 78 79 78   < > 107* 106* 88  BUN 37*   < > 45*   < > 47* 47* 45*   < > 19 20 24*  CREATININE 4.31*   < > 5.14*   < > 6.61* 6.12* 5.37*   < > 1.39* 1.50* 1.41*  CALCIUM 7.5*   < > 6.5*   < > 8.5* 8.6* 8.7*   < > 8.4* 8.1* 8.6*  MG 1.2*  --  1.5*  --   --   --   --   --   --   --   --   PHOS 5.1*  --  5.6*   < > 6.8* 6.6* 5.5*  --   --   --   --    < > = values in this interval not displayed.   Liver Function Tests: Recent Labs    05/06/17 0150 05/07/17 0455 05/31/17 1107  AST 34 34 13*  ALT 46 50 10*  ALKPHOS 107 100 89  BILITOT 0.7 0.9 0.6  PROT 8.0 7.3 5.8*  ALBUMIN 3.4* 3.0* 2.2*   Recent Labs    05/06/17 0150 05/07/17 0455 05/31/17 1107  LIPASE 52* 47 27   No results for input(s):  AMMONIA in the last 8760 hours. CBC: Recent Labs    07/16/17 2043  07/19/17 0754 07/20/17 0808 07/23/17 0415  WBC 10.8*   < > 9.9 8.8 7.8  NEUTROABS 8.7*  --   --  5.6 5.0  HGB 9.8*   < > 11.3* 10.2* 10.0*  HCT 31.9*   < > 35.1* 31.9* 31.4*  MCV 98.8   < > 94.6 95.2 95.2  PLT 228   < > 159 154 199   < > = values in this interval not displayed.   Cardiac Enzymes: Recent Labs    05/07/17 0455 05/07/17 1042 05/07/17 1605  CKTOTAL 52  --   --   CKMB 0.9  --   --   TROPONINI <0.03 <0.03 <0.03   BNP: Invalid input(s): POCBNP Lab Results  Component Value Date   HGBA1C 6.0 (H) 09/04/2013   Lab Results  Component Value Date   TSH 1.73 04/30/2015   No results found for: VITAMINB12 No results found for: FOLATE No results found for: IRON, TIBC, FERRITIN  Imaging and Procedures obtained prior to SNF admission: No results found.  Assessment/Plan There are no diagnoses linked to this encounter.   Family/ staff Communication:   Labs/tests ordered:

## 2017-08-06 ENCOUNTER — Encounter: Payer: Self-pay | Admitting: Orthopedic Surgery

## 2017-08-06 ENCOUNTER — Ambulatory Visit (INDEPENDENT_AMBULATORY_CARE_PROVIDER_SITE_OTHER): Payer: Self-pay | Admitting: Orthopedic Surgery

## 2017-08-06 ENCOUNTER — Inpatient Hospital Stay (INDEPENDENT_AMBULATORY_CARE_PROVIDER_SITE_OTHER): Payer: No Typology Code available for payment source

## 2017-08-06 VITALS — BP 125/88 | HR 65 | Ht 66.5 in | Wt 180.0 lb

## 2017-08-06 DIAGNOSIS — Z967 Presence of other bone and tendon implants: Secondary | ICD-10-CM

## 2017-08-06 DIAGNOSIS — Z8781 Personal history of (healed) traumatic fracture: Secondary | ICD-10-CM | POA: Diagnosis not present

## 2017-08-06 DIAGNOSIS — Z9889 Other specified postprocedural states: Secondary | ICD-10-CM

## 2017-08-06 NOTE — Progress Notes (Signed)
POSTOP VISIT  POD #19  Chief Complaint  Patient presents with  . Post-op Follow-up    07/18/17 cannulated screw placement left hip     84 year old male status post internal fixation of his left hip with cannulated screws doing well but he does have preoperative hemi-plegia from head injury.  He needs extensive rehab  His incision looks clean dry and intact his x-ray looks good.   Encounter Diagnosis  Name Primary?  . S/P ORIF (open reduction internal fixation) fracture left hip cannulated screw placement 07/18/17 Yes    Postoperative plan (Work, WB, No orders of the defined types were placed in this encounter. ,FU)  Weight-bear as tolerated follow-up 6 weeks repeat hip x-rays

## 2017-08-07 ENCOUNTER — Encounter (HOSPITAL_COMMUNITY)
Admission: RE | Admit: 2017-08-07 | Discharge: 2017-08-07 | Disposition: A | Discharge status: Home / Self Care | Payer: Medicare Other | Source: Skilled Nursing Facility | Attending: Internal Medicine | Admitting: Internal Medicine

## 2017-08-07 ENCOUNTER — Non-Acute Institutional Stay (SKILLED_NURSING_FACILITY): Payer: Medicare Other | Admitting: Internal Medicine

## 2017-08-07 ENCOUNTER — Encounter: Payer: Self-pay | Admitting: Internal Medicine

## 2017-08-07 DIAGNOSIS — K811 Chronic cholecystitis: Secondary | ICD-10-CM

## 2017-08-07 DIAGNOSIS — Z9889 Other specified postprocedural states: Secondary | ICD-10-CM

## 2017-08-07 DIAGNOSIS — I480 Paroxysmal atrial fibrillation: Secondary | ICD-10-CM

## 2017-08-07 DIAGNOSIS — Z8781 Personal history of (healed) traumatic fracture: Secondary | ICD-10-CM | POA: Diagnosis not present

## 2017-08-07 DIAGNOSIS — E785 Hyperlipidemia, unspecified: Secondary | ICD-10-CM | POA: Insufficient documentation

## 2017-08-07 DIAGNOSIS — S72065D Nondisplaced articular fracture of head of left femur, subsequent encounter for closed fracture with routine healing: Secondary | ICD-10-CM | POA: Insufficient documentation

## 2017-08-07 DIAGNOSIS — G8114 Spastic hemiplegia affecting left nondominant side: Secondary | ICD-10-CM | POA: Diagnosis not present

## 2017-08-07 DIAGNOSIS — I10 Essential (primary) hypertension: Secondary | ICD-10-CM | POA: Diagnosis not present

## 2017-08-07 DIAGNOSIS — Z967 Presence of other bone and tendon implants: Secondary | ICD-10-CM | POA: Diagnosis not present

## 2017-08-07 DIAGNOSIS — Z7901 Long term (current) use of anticoagulants: Secondary | ICD-10-CM | POA: Insufficient documentation

## 2017-08-07 DIAGNOSIS — Z9181 History of falling: Secondary | ICD-10-CM | POA: Insufficient documentation

## 2017-08-07 LAB — PROTIME-INR
INR: 2.44
Prothrombin Time: 26.3 seconds — ABNORMAL HIGH (ref 11.4–15.2)
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
## 2017-08-09 ENCOUNTER — Other Ambulatory Visit: Payer: Self-pay

## 2017-08-09 ENCOUNTER — Encounter (HOSPITAL_COMMUNITY): Payer: Self-pay

## 2017-08-09 ENCOUNTER — Encounter (HOSPITAL_COMMUNITY)
Admission: RE | Admit: 2017-08-09 | Discharge: 2017-08-09 | Disposition: A | Discharge status: Home / Self Care | Payer: Medicare Other | Source: Ambulatory Visit | Attending: General Surgery | Admitting: General Surgery

## 2017-08-09 DIAGNOSIS — N183 Chronic kidney disease, stage 3 (moderate): Secondary | ICD-10-CM | POA: Insufficient documentation

## 2017-08-09 DIAGNOSIS — Z01818 Encounter for other preprocedural examination: Secondary | ICD-10-CM | POA: Insufficient documentation

## 2017-08-09 DIAGNOSIS — I480 Paroxysmal atrial fibrillation: Secondary | ICD-10-CM | POA: Insufficient documentation

## 2017-08-09 DIAGNOSIS — S72065D Nondisplaced articular fracture of head of left femur, subsequent encounter for closed fracture with routine healing: Secondary | ICD-10-CM | POA: Diagnosis not present

## 2017-08-09 DIAGNOSIS — I129 Hypertensive chronic kidney disease with stage 1 through stage 4 chronic kidney disease, or unspecified chronic kidney disease: Secondary | ICD-10-CM | POA: Diagnosis not present

## 2017-08-09 DIAGNOSIS — K8000 Calculus of gallbladder with acute cholecystitis without obstruction: Secondary | ICD-10-CM | POA: Diagnosis not present

## 2017-08-09 DIAGNOSIS — Z79899 Other long term (current) drug therapy: Secondary | ICD-10-CM | POA: Diagnosis not present

## 2017-08-09 DIAGNOSIS — Z9889 Other specified postprocedural states: Secondary | ICD-10-CM | POA: Insufficient documentation

## 2017-08-09 DIAGNOSIS — M6281 Muscle weakness (generalized): Secondary | ICD-10-CM | POA: Diagnosis not present

## 2017-08-09 DIAGNOSIS — G8114 Spastic hemiplegia affecting left nondominant side: Secondary | ICD-10-CM | POA: Diagnosis not present

## 2017-08-09 DIAGNOSIS — R262 Difficulty in walking, not elsewhere classified: Secondary | ICD-10-CM | POA: Diagnosis not present

## 2017-08-09 DIAGNOSIS — R279 Unspecified lack of coordination: Secondary | ICD-10-CM | POA: Diagnosis not present

## 2017-08-09 HISTORY — DX: Cholecystitis, unspecified: K81.9

## 2017-08-09 HISTORY — DX: Bilateral inguinal hernia, without obstruction or gangrene, not specified as recurrent: K40.20

## 2017-08-09 HISTORY — DX: Gastro-esophageal reflux disease without esophagitis: K21.9

## 2017-08-09 HISTORY — DX: Calculus of gallbladder without cholecystitis without obstruction: K80.20

## 2017-08-09 HISTORY — DX: Paroxysmal atrial fibrillation: I48.0

## 2017-08-09 LAB — CBC WITH DIFFERENTIAL/PLATELET
Abs Immature Granulocytes: 0 10*3/uL (ref 0.0–0.1)
Basophils Absolute: 0.1 10*3/uL (ref 0.0–0.1)
Basophils Relative: 1 %
Eosinophils Absolute: 0.4 10*3/uL (ref 0.0–0.7)
Eosinophils Relative: 4 %
HCT: 39.2 % (ref 39.0–52.0)
Hemoglobin: 12 g/dL — ABNORMAL LOW (ref 13.0–17.0)
Immature Granulocytes: 0 %
Lymphocytes Relative: 16 %
Lymphs Abs: 1.3 10*3/uL (ref 0.7–4.0)
MCH: 30.4 pg (ref 26.0–34.0)
MCHC: 30.6 g/dL (ref 30.0–36.0)
MCV: 99.2 fL (ref 78.0–100.0)
Monocytes Absolute: 0.7 10*3/uL (ref 0.1–1.0)
Monocytes Relative: 8 %
Neutro Abs: 5.9 10*3/uL (ref 1.7–7.7)
Neutrophils Relative %: 71 %
Platelets: 244 10*3/uL (ref 150–400)
RBC: 3.95 MIL/uL — ABNORMAL LOW (ref 4.22–5.81)
RDW: 15 % (ref 11.5–15.5)
WBC: 8.4 10*3/uL (ref 4.0–10.5)

## 2017-08-09 LAB — COMPREHENSIVE METABOLIC PANEL
ALT: 11 U/L (ref 0–44)
AST: 32 U/L (ref 15–41)
Albumin: 3.5 g/dL (ref 3.5–5.0)
Alkaline Phosphatase: 152 U/L — ABNORMAL HIGH (ref 38–126)
Anion gap: 8 (ref 5–15)
BUN: 24 mg/dL — ABNORMAL HIGH (ref 8–23)
CO2: 22 mmol/L (ref 22–32)
Calcium: 9.1 mg/dL (ref 8.9–10.3)
Chloride: 109 mmol/L (ref 98–111)
Creatinine, Ser: 1.7 mg/dL — ABNORMAL HIGH (ref 0.61–1.24)
GFR calc Af Amer: 41 mL/min — ABNORMAL LOW (ref >60)
GFR calc non Af Amer: 36 mL/min — ABNORMAL LOW (ref >60)
Glucose, Bld: 106 mg/dL — ABNORMAL HIGH (ref 70–99)
Potassium: 4.9 mmol/L (ref 3.5–5.1)
Sodium: 139 mmol/L (ref 135–145)
Total Bilirubin: 1.4 mg/dL — ABNORMAL HIGH (ref 0.3–1.2)
Total Protein: 7.1 g/dL (ref 6.5–8.1)

## 2017-08-09 LAB — SURGICAL PCR SCREEN
MRSA, PCR: POSITIVE — AB
Staphylococcus aureus: POSITIVE — AB

## 2017-08-09 NOTE — Progress Notes (Signed)
Asa Lente, RN from [facility] made aware of positive MRSA and STAPH pcr screen. Asa Lente sts the pt already has mupirocin medicine, in which the pt only used for 3 days. Della instructed to administer the medicine BID x5 days prior to surgery. Asa Lente verbalized an understanding.

## 2017-08-09 NOTE — Progress Notes (Addendum)
PCP - Dr. Cathlean Cower  Cardiologist - Dr. Jenne Campus  Chest x-ray - 05/31/17 (E)  EKG - 07/16/17 (E)  Stress Test - 07/02/17 (E)  ECHO - 04/10/17 (E)  Cardiac Cath - Denies  Sleep Study - Denies CPAP - None  LABS- 08/09/17: CBC w/D, CMP 08/17/17: PT  ASA- Denies Coumadin- LD- 7/20, per [facility]  Pt accompanied by his wife [name]. Instructions given to the wife to give to the nursing home.  Anesthesia- Yes- History  Pt denies having chest pain, sob, or fever at this time. All instructions explained to the pt, with a verbal understanding of the material. Pt agrees to go over the instructions while at home for a better understanding. The opportunity to ask questions was provided.

## 2017-08-09 NOTE — Pre-Procedure Instructions (Signed)
DEAKIN LACEK  08/09/2017      [facility] - [place], [place] [address] Phone: [phone] Fax: [phone]    Your procedure is scheduled on Fri., August 17, 2017 from 7:30AM-8:30AM  Report to [facility] Admitting Entrance "A" at 5:30AM  Call this number if you have problems the morning of surgery:  [phone]   Remember:  Do not eat or drink after midnight on July 25th    Take these medicines the morning of surgery with A SIP OF WATER: Gabapentin (NEURONTIN) and Omeprazole (PRILOSEC) If needed Acetaminophen (TYLENOL)   Follow your doctors instructions regarding your Coumadin.  If no instructions were given by your doctor, then you will need to call the prescribing office office to get instructions.    7 days before surgery (7/19), stop taking all Other Aspirin Products, Vitamins, Fish oils, and Herbal medications. Also stop all NSAIDS i.e. Advil, Ibuprofen, Motrin, Aleve, Anaprox, Naproxen, BC, Goody Powders, and all Supplements.   Do not wear jewelry.  Do not wear lotions, powders, colognes, or deodorant.  Do not shave 48 hours prior to surgery.  Men may shave face.  Do not bring valuables to the hospital.  [facility] is not responsible for any belongings or valuables.  Contacts, dentures or bridgework may not be worn into surgery.  Leave your suitcase in the car.  After surgery it may be brought to your room.  For patients admitted to the hospital, discharge time will be determined by your treatment team.  Patients discharged the day of surgery will not be allowed to drive home.   Special instructions: [place]- Preparing For Surgery  Before surgery, you can play an important role. Because skin is not sterile, your skin needs to be as free of germs as possible. You can reduce the number of germs on your skin by washing with CHG (chlorahexidine gluconate) Soap before surgery.  CHG is an antiseptic  cleaner which kills germs and bonds with the skin to continue killing germs even after washing.    Oral Hygiene is also important to reduce your risk of infection.  Remember - BRUSH YOUR TEETH THE MORNING OF SURGERY WITH YOUR REGULAR TOOTHPASTE  Please do not use if you have an allergy to CHG or antibacterial soaps. If your skin becomes reddened/irritated stop using the CHG.  Do not shave (including legs and underarms) for at least 48 hours prior to first CHG shower. It is OK to shave your face.  Please follow these instructions carefully.   1. Shower the NIGHT BEFORE SURGERY and the MORNING OF SURGERY with CHG.   2. If you chose to wash your hair, wash your hair first as usual with your normal shampoo.  3. After you shampoo, rinse your hair and body thoroughly to remove the shampoo.  4. Use CHG as you would any other liquid soap. You can apply CHG directly to the skin and wash gently with a scrungie or a clean washcloth.   5. Apply the CHG Soap to your body ONLY FROM THE NECK DOWN.  Do not use on open wounds or open sores. Avoid contact with your eyes, ears, mouth and genitals (private parts). Wash Face and genitals (private parts)  with your normal soap.  6. Wash thoroughly, paying special attention to the area where your surgery will be performed.  7. Thoroughly rinse your body with warm water from the neck down.  8.  DO NOT shower/wash with your normal soap after using and rinsing off the CHG Soap.  9. Pat yourself dry with a CLEAN TOWEL.  10. Wear CLEAN PAJAMAS to bed the night before surgery, wear comfortable clothes the morning of surgery  11. Place CLEAN SHEETS on your bed the night of your first shower and DO NOT SLEEP WITH PETS.  Day of Surgery:  Do not apply any deodorants/lotions.  Please wear clean clothes to the hospital/surgery center.   Remember to brush your teeth WITH YOUR REGULAR TOOTHPASTE.  Please read over the following fact sheets that you were given. Pain  Booklet, Coughing and Deep Breathing and Surgical Site Infection Prevention

## 2017-08-10 ENCOUNTER — Encounter (HOSPITAL_COMMUNITY): Payer: Self-pay

## 2017-08-10 DIAGNOSIS — S72065D Nondisplaced articular fracture of head of left femur, subsequent encounter for closed fracture with routine healing: Secondary | ICD-10-CM | POA: Diagnosis not present

## 2017-08-10 DIAGNOSIS — M6281 Muscle weakness (generalized): Secondary | ICD-10-CM | POA: Diagnosis not present

## 2017-08-10 DIAGNOSIS — G8114 Spastic hemiplegia affecting left nondominant side: Secondary | ICD-10-CM | POA: Diagnosis not present

## 2017-08-10 DIAGNOSIS — R262 Difficulty in walking, not elsewhere classified: Secondary | ICD-10-CM | POA: Diagnosis not present

## 2017-08-10 DIAGNOSIS — R279 Unspecified lack of coordination: Secondary | ICD-10-CM | POA: Diagnosis not present
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
## 2017-08-13 ENCOUNTER — Encounter (HOSPITAL_COMMUNITY)
Admission: RE | Admit: 2017-08-13 | Discharge: 2017-08-13 | Disposition: A | Discharge status: Home / Self Care | Payer: Medicare Other | Source: Skilled Nursing Facility | Attending: *Deleted | Admitting: *Deleted

## 2017-08-13 DIAGNOSIS — R279 Unspecified lack of coordination: Secondary | ICD-10-CM | POA: Diagnosis not present

## 2017-08-13 DIAGNOSIS — M6281 Muscle weakness (generalized): Secondary | ICD-10-CM | POA: Diagnosis not present

## 2017-08-13 DIAGNOSIS — G8114 Spastic hemiplegia affecting left nondominant side: Secondary | ICD-10-CM | POA: Diagnosis not present

## 2017-08-13 DIAGNOSIS — R262 Difficulty in walking, not elsewhere classified: Secondary | ICD-10-CM | POA: Diagnosis not present

## 2017-08-13 DIAGNOSIS — N183 Chronic kidney disease, stage 3 (moderate): Secondary | ICD-10-CM | POA: Diagnosis not present

## 2017-08-13 DIAGNOSIS — S72065D Nondisplaced articular fracture of head of left femur, subsequent encounter for closed fracture with routine healing: Secondary | ICD-10-CM | POA: Diagnosis not present

## 2017-08-13 LAB — BASIC METABOLIC PANEL
Anion gap: 8 (ref 5–15)
BUN: 29 mg/dL — ABNORMAL HIGH (ref 8–23)
CO2: 23 mmol/L (ref 22–32)
Calcium: 9.1 mg/dL (ref 8.9–10.3)
Chloride: 111 mmol/L (ref 98–111)
Creatinine, Ser: 1.75 mg/dL — ABNORMAL HIGH (ref 0.61–1.24)
GFR calc Af Amer: 40 mL/min — ABNORMAL LOW (ref >60)
GFR calc non Af Amer: 35 mL/min — ABNORMAL LOW (ref >60)
Glucose, Bld: 111 mg/dL — ABNORMAL HIGH (ref 70–99)
Potassium: 3.6 mmol/L (ref 3.5–5.1)
Sodium: 142 mmol/L (ref 135–145)

## 2017-08-13 LAB — PROTIME-INR
INR: 2.46
Prothrombin Time: 26.5 seconds — ABNORMAL HIGH (ref 11.4–15.2)

## 2017-08-13 LAB — CBC
HCT: 35.9 % — ABNORMAL LOW (ref 39.0–52.0)
Hemoglobin: 11.2 g/dL — ABNORMAL LOW (ref 13.0–17.0)
MCH: 29.7 pg (ref 26.0–34.0)
MCHC: 31.2 g/dL (ref 30.0–36.0)
MCV: 95.2 fL (ref 78.0–100.0)
Platelets: 191 10*3/uL (ref 150–400)
RBC: 3.77 MIL/uL — ABNORMAL LOW (ref 4.22–5.81)
RDW: 15.1 % (ref 11.5–15.5)
WBC: 7.6 10*3/uL (ref 4.0–10.5)

## 2017-08-14 ENCOUNTER — Ambulatory Visit: Payer: Medicare Other | Admitting: Cardiology

## 2017-08-14 ENCOUNTER — Ambulatory Visit: Payer: Self-pay | Admitting: *Deleted

## 2017-08-16 ENCOUNTER — Non-Acute Institutional Stay (SKILLED_NURSING_FACILITY): Payer: Medicare Other | Admitting: Internal Medicine

## 2017-08-16 ENCOUNTER — Encounter: Payer: Self-pay | Admitting: Internal Medicine

## 2017-08-16 DIAGNOSIS — I10 Essential (primary) hypertension: Secondary | ICD-10-CM

## 2017-08-16 DIAGNOSIS — I480 Paroxysmal atrial fibrillation: Secondary | ICD-10-CM | POA: Diagnosis not present

## 2017-08-16 DIAGNOSIS — G8114 Spastic hemiplegia affecting left nondominant side: Secondary | ICD-10-CM

## 2017-08-16 DIAGNOSIS — R21 Rash and other nonspecific skin eruption: Secondary | ICD-10-CM
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
## 2017-08-17 ENCOUNTER — Ambulatory Visit (HOSPITAL_COMMUNITY): Payer: Medicare Other

## 2017-08-17 ENCOUNTER — Ambulatory Visit (HOSPITAL_COMMUNITY): Payer: Medicare Other | Admitting: Anesthesiology

## 2017-08-17 ENCOUNTER — Encounter (HOSPITAL_COMMUNITY): Payer: Self-pay

## 2017-08-17 ENCOUNTER — Encounter (HOSPITAL_COMMUNITY)
Admission: RE | Disposition: A | Discharge status: Skilled Nursing Facility | Payer: Self-pay | Source: Ambulatory Visit | Attending: General Surgery

## 2017-08-17 ENCOUNTER — Ambulatory Visit (HOSPITAL_COMMUNITY): Payer: Medicare Other | Admitting: Physician Assistant

## 2017-08-17 ENCOUNTER — Inpatient Hospital Stay (HOSPITAL_COMMUNITY)
Admission: RE | Admit: 2017-08-17 | Discharge: 2017-08-22 | DRG: 418 | Disposition: A | Discharge status: Skilled Nursing Facility | Payer: Medicare Other | Source: Ambulatory Visit | Attending: General Surgery | Admitting: General Surgery

## 2017-08-17 ENCOUNTER — Ambulatory Visit: Payer: Self-pay | Admitting: *Deleted

## 2017-08-17 DIAGNOSIS — Z8546 Personal history of malignant neoplasm of prostate: Secondary | ICD-10-CM | POA: Diagnosis not present

## 2017-08-17 DIAGNOSIS — I509 Heart failure, unspecified: Secondary | ICD-10-CM | POA: Diagnosis present

## 2017-08-17 DIAGNOSIS — I480 Paroxysmal atrial fibrillation: Secondary | ICD-10-CM | POA: Diagnosis present

## 2017-08-17 DIAGNOSIS — K8042 Calculus of bile duct with acute cholecystitis without obstruction: Secondary | ICD-10-CM | POA: Diagnosis present

## 2017-08-17 DIAGNOSIS — E785 Hyperlipidemia, unspecified: Secondary | ICD-10-CM | POA: Diagnosis not present

## 2017-08-17 DIAGNOSIS — Z7901 Long term (current) use of anticoagulants: Secondary | ICD-10-CM | POA: Diagnosis not present

## 2017-08-17 DIAGNOSIS — Z86718 Personal history of other venous thrombosis and embolism: Secondary | ICD-10-CM

## 2017-08-17 DIAGNOSIS — K839 Disease of biliary tract, unspecified: Secondary | ICD-10-CM | POA: Diagnosis not present

## 2017-08-17 DIAGNOSIS — K8000 Calculus of gallbladder with acute cholecystitis without obstruction: Secondary | ICD-10-CM | POA: Diagnosis not present

## 2017-08-17 DIAGNOSIS — N183 Chronic kidney disease, stage 3 (moderate): Secondary | ICD-10-CM | POA: Diagnosis present

## 2017-08-17 DIAGNOSIS — K8050 Calculus of bile duct without cholangitis or cholecystitis without obstruction: Secondary | ICD-10-CM | POA: Diagnosis not present

## 2017-08-17 DIAGNOSIS — K219 Gastro-esophageal reflux disease without esophagitis: Secondary | ICD-10-CM | POA: Diagnosis present

## 2017-08-17 DIAGNOSIS — Z87891 Personal history of nicotine dependence: Secondary | ICD-10-CM | POA: Diagnosis not present

## 2017-08-17 DIAGNOSIS — G8194 Hemiplegia, unspecified affecting left nondominant side: Secondary | ICD-10-CM | POA: Diagnosis present

## 2017-08-17 DIAGNOSIS — Z801 Family history of malignant neoplasm of trachea, bronchus and lung: Secondary | ICD-10-CM

## 2017-08-17 DIAGNOSIS — G839 Paralytic syndrome, unspecified: Secondary | ICD-10-CM | POA: Diagnosis present

## 2017-08-17 DIAGNOSIS — K8063 Calculus of gallbladder and bile duct with acute cholecystitis with obstruction: Secondary | ICD-10-CM | POA: Diagnosis not present

## 2017-08-17 DIAGNOSIS — I130 Hypertensive heart and chronic kidney disease with heart failure and stage 1 through stage 4 chronic kidney disease, or unspecified chronic kidney disease: Secondary | ICD-10-CM | POA: Diagnosis not present

## 2017-08-17 DIAGNOSIS — Z86010 Personal history of colonic polyps: Secondary | ICD-10-CM | POA: Diagnosis not present

## 2017-08-17 DIAGNOSIS — R52 Pain, unspecified: Secondary | ICD-10-CM

## 2017-08-17 DIAGNOSIS — I739 Peripheral vascular disease, unspecified: Secondary | ICD-10-CM | POA: Diagnosis present

## 2017-08-17 DIAGNOSIS — K8010 Calculus of gallbladder with chronic cholecystitis without obstruction: Secondary | ICD-10-CM | POA: Diagnosis not present

## 2017-08-17 HISTORY — PX: CHOLECYSTECTOMY: SHX55

## 2017-08-17 LAB — TYPE AND SCREEN
ABO/RH(D): A POS
Antibody Screen: NEGATIVE

## 2017-08-17 LAB — PROTIME-INR
INR: 1.12
Prothrombin Time: 14.3 seconds (ref 11.4–15.2)

## 2017-08-17 SURGERY — LAPAROSCOPIC CHOLECYSTECTOMY WITH INTRAOPERATIVE CHOLANGIOGRAM
Anesthesia: General | Site: Abdomen

## 2017-08-17 MED ORDER — PHENYLEPHRINE 40 MCG/ML (10ML) SYRINGE FOR IV PUSH (FOR BLOOD PRESSURE SUPPORT)
PREFILLED_SYRINGE | INTRAVENOUS | Status: DC | PRN
  Administered 2017-08-17: 120 ug via INTRAVENOUS

## 2017-08-17 MED ORDER — PROPOFOL 10 MG/ML IV BOLUS
INTRAVENOUS | Status: DC | PRN
  Administered 2017-08-17: 120 mg via INTRAVENOUS

## 2017-08-17 MED ORDER — SODIUM CHLORIDE 0.9 % IV SOLN
INTRAVENOUS | Status: DC | PRN
  Administered 2017-08-17: 07:00:00

## 2017-08-17 MED ORDER — FENTANYL CITRATE (PF) 100 MCG/2ML IJ SOLN: 25.0000 ug | INTRAMUSCULAR | Status: DC | PRN

## 2017-08-17 MED ORDER — DEXAMETHASONE SODIUM PHOSPHATE 10 MG/ML IJ SOLN
INTRAMUSCULAR | Status: DC | PRN
  Administered 2017-08-17: 10 mg via INTRAVENOUS

## 2017-08-17 MED ORDER — LACTATED RINGERS IV SOLN
INTRAVENOUS | Status: DC | PRN
  Administered 2017-08-17 (×2): via INTRAVENOUS

## 2017-08-17 MED ORDER — HYDROMORPHONE HCL 1 MG/ML IJ SOLN: 0.5000 mg | INTRAMUSCULAR | Status: DC | PRN

## 2017-08-17 MED ORDER — KCL IN DEXTROSE-NACL 20-5-0.45 MEQ/L-%-% IV SOLN
INTRAVENOUS | Status: DC
  Administered 2017-08-17 – 2017-08-20 (×7): via INTRAVENOUS
  Filled 2017-08-17 (×8): qty 1000

## 2017-08-17 MED ORDER — 0.9 % SODIUM CHLORIDE (POUR BTL) OPTIME
TOPICAL | Status: DC | PRN
  Administered 2017-08-17: 1000 mL

## 2017-08-17 MED ORDER — OXYCODONE HCL 5 MG/5ML PO SOLN: 5.0000 mg | Freq: Once | ORAL | Status: DC | PRN

## 2017-08-17 MED ORDER — FENTANYL CITRATE (PF) 250 MCG/5ML IJ SOLN
INTRAMUSCULAR | Status: AC
  Filled 2017-08-17: qty 5

## 2017-08-17 MED ORDER — GABAPENTIN 100 MG PO CAPS: 100.0000 mg | ORAL_CAPSULE | ORAL | Status: DC

## 2017-08-17 MED ORDER — OXYCODONE HCL 5 MG PO TABS: 5.0000 mg | ORAL_TABLET | ORAL | Status: DC | PRN

## 2017-08-17 MED ORDER — FENTANYL CITRATE (PF) 100 MCG/2ML IJ SOLN
INTRAMUSCULAR | Status: DC | PRN
  Administered 2017-08-17: 50 ug via INTRAVENOUS
  Administered 2017-08-17: 100 ug via INTRAVENOUS

## 2017-08-17 MED ORDER — SODIUM CHLORIDE 0.9 % IV SOLN
2.0000 g | INTRAVENOUS | Status: AC
  Administered 2017-08-17: 2 g via INTRAVENOUS
  Filled 2017-08-17: qty 2

## 2017-08-17 MED ORDER — LIDOCAINE HCL (CARDIAC) PF 100 MG/5ML IV SOSY
PREFILLED_SYRINGE | INTRAVENOUS | Status: DC | PRN
  Administered 2017-08-17: 20 mg via INTRAVENOUS
  Administered 2017-08-17: 80 mg via INTRAVENOUS

## 2017-08-17 MED ORDER — GABAPENTIN 300 MG PO CAPS
300.0000 mg | ORAL_CAPSULE | Freq: Every day | ORAL | Status: DC
  Administered 2017-08-17 – 2017-08-21 (×5): 300 mg via ORAL
  Filled 2017-08-17 (×7): qty 1

## 2017-08-17 MED ORDER — CELECOXIB 200 MG PO CAPS: 200.0000 mg | ORAL_CAPSULE | Freq: Two times a day (BID) | ORAL | Status: DC

## 2017-08-17 MED ORDER — ONDANSETRON HCL 4 MG/2ML IJ SOLN: 4.0000 mg | Freq: Once | INTRAMUSCULAR | Status: DC | PRN

## 2017-08-17 MED ORDER — MUPIROCIN 2 % EX OINT
1.0000 "application " | TOPICAL_OINTMENT | Freq: Two times a day (BID) | CUTANEOUS | Status: AC
  Administered 2017-08-17 – 2017-08-22 (×10): 1 via NASAL
  Filled 2017-08-17 (×2): qty 22

## 2017-08-17 MED ORDER — SENNA 8.6 MG PO TABS
1.0000 | ORAL_TABLET | Freq: Two times a day (BID) | ORAL | Status: DC
  Administered 2017-08-17 – 2017-08-22 (×11): 8.6 mg via ORAL
  Filled 2017-08-17 (×11): qty 1

## 2017-08-17 MED ORDER — CHLORHEXIDINE GLUCONATE CLOTH 2 % EX PADS: 6.0000 | MEDICATED_PAD | Freq: Once | CUTANEOUS | Status: DC

## 2017-08-17 MED ORDER — FUROSEMIDE 20 MG PO TABS
20.0000 mg | ORAL_TABLET | Freq: Every day | ORAL | Status: DC
  Administered 2017-08-17 – 2017-08-22 (×6): 20 mg via ORAL
  Filled 2017-08-17 (×6): qty 1

## 2017-08-17 MED ORDER — ROSUVASTATIN CALCIUM 10 MG PO TABS
10.0000 mg | ORAL_TABLET | Freq: Every day | ORAL | Status: DC
  Administered 2017-08-17 – 2017-08-22 (×5): 10 mg via ORAL
  Filled 2017-08-17 (×6): qty 1

## 2017-08-17 MED ORDER — CHLORHEXIDINE GLUCONATE CLOTH 2 % EX PADS
6.0000 | MEDICATED_PAD | Freq: Every day | CUTANEOUS | Status: DC
  Administered 2017-08-18 – 2017-08-22 (×4): 6 via TOPICAL

## 2017-08-17 MED ORDER — BUPIVACAINE-EPINEPHRINE (PF) 0.25% -1:200000 IJ SOLN
INTRAMUSCULAR | Status: AC
  Filled 2017-08-17: qty 30

## 2017-08-17 MED ORDER — SODIUM CHLORIDE 0.9 % IV SOLN
2.0000 g | Freq: Two times a day (BID) | INTRAVENOUS | Status: AC
  Administered 2017-08-17: 2 g via INTRAVENOUS
  Filled 2017-08-17 (×2): qty 2

## 2017-08-17 MED ORDER — PHENYLEPHRINE HCL 10 MG/ML IJ SOLN
INTRAMUSCULAR | Status: DC | PRN
  Administered 2017-08-17: 30 ug/min via INTRAVENOUS
  Administered 2017-08-17: 35 ug/min via INTRAVENOUS

## 2017-08-17 MED ORDER — PANTOPRAZOLE SODIUM 40 MG PO TBEC
40.0000 mg | DELAYED_RELEASE_TABLET | Freq: Every day | ORAL | Status: DC
  Administered 2017-08-17 – 2017-08-22 (×6): 40 mg via ORAL
  Filled 2017-08-17 (×6): qty 1

## 2017-08-17 MED ORDER — SODIUM CHLORIDE 0.9 % IR SOLN
Status: DC | PRN
  Administered 2017-08-17: 1000 mL

## 2017-08-17 MED ORDER — GABAPENTIN 100 MG PO CAPS
100.0000 mg | ORAL_CAPSULE | ORAL | Status: DC
  Administered 2017-08-17 – 2017-08-22 (×11): 100 mg via ORAL
  Filled 2017-08-17 (×11): qty 1

## 2017-08-17 MED ORDER — GABAPENTIN 300 MG PO CAPS: 300.0000 mg | ORAL_CAPSULE | Freq: Two times a day (BID) | ORAL | Status: DC

## 2017-08-17 MED ORDER — PROPOFOL 10 MG/ML IV BOLUS
INTRAVENOUS | Status: AC
  Filled 2017-08-17: qty 20

## 2017-08-17 MED ORDER — BUPIVACAINE-EPINEPHRINE 0.25% -1:200000 IJ SOLN
INTRAMUSCULAR | Status: DC | PRN
  Administered 2017-08-17: 30 mL

## 2017-08-17 MED ORDER — IOPAMIDOL (ISOVUE-300) INJECTION 61%
INTRAVENOUS | Status: AC
Start: 2017-08-17 — End: ?
  Filled 2017-08-17: qty 50

## 2017-08-17 MED ORDER — BUPIVACAINE HCL (PF) 0.5 % IJ SOLN
INTRAMUSCULAR | Status: AC
  Filled 2017-08-17: qty 30

## 2017-08-17 MED ORDER — ROCURONIUM BROMIDE 100 MG/10ML IV SOLN
INTRAVENOUS | Status: DC | PRN
  Administered 2017-08-17: 50 mg via INTRAVENOUS
  Administered 2017-08-17: 20 mg via INTRAVENOUS

## 2017-08-17 MED ORDER — ACETAMINOPHEN 325 MG PO TABS
650.0000 mg | ORAL_TABLET | Freq: Three times a day (TID) | ORAL | Status: DC | PRN
  Administered 2017-08-20: 650 mg via ORAL
  Filled 2017-08-17: qty 2

## 2017-08-17 MED ORDER — SUGAMMADEX SODIUM 200 MG/2ML IV SOLN
INTRAVENOUS | Status: DC | PRN
  Administered 2017-08-17: 400 mg via INTRAVENOUS

## 2017-08-17 MED ORDER — OXYCODONE HCL 5 MG PO TABS: 5.0000 mg | ORAL_TABLET | Freq: Once | ORAL | Status: DC | PRN

## 2017-08-17 MED ORDER — POLYETHYLENE GLYCOL 3350 17 G PO PACK
17.0000 g | PACK | Freq: Every day | ORAL | Status: DC
  Administered 2017-08-17 – 2017-08-22 (×5): 17 g via ORAL
  Filled 2017-08-17 (×5): qty 1

## 2017-08-17 MED ORDER — ONDANSETRON HCL 4 MG/2ML IJ SOLN
INTRAMUSCULAR | Status: DC | PRN
  Administered 2017-08-17: 4 mg via INTRAVENOUS

## 2017-08-17 SURGICAL SUPPLY — 47 items
ADH SKN CLS APL DERMABOND .7 (GAUZE/BANDAGES/DRESSINGS) ×1
APPLIER CLIP 5 13 M/L LIGAMAX5 (MISCELLANEOUS) ×2
APR CLP MED LRG 5 ANG JAW (MISCELLANEOUS) ×1
BAG SPEC RTRVL 10 TROC 200 (ENDOMECHANICALS) ×1
BLADE CLIPPER SURG (BLADE) IMPLANT
CANISTER SUCT 3000ML PPV (MISCELLANEOUS) ×2 IMPLANT
CHLORAPREP W/TINT 26ML (MISCELLANEOUS) ×2 IMPLANT
CLIP APPLIE 5 13 M/L LIGAMAX5 (MISCELLANEOUS) ×1 IMPLANT
COVER MAYO STAND STRL (DRAPES) IMPLANT
COVER SURGICAL LIGHT HANDLE (MISCELLANEOUS) ×2 IMPLANT
DERMABOND ADVANCED (GAUZE/BANDAGES/DRESSINGS) ×1
DERMABOND ADVANCED .7 DNX12 (GAUZE/BANDAGES/DRESSINGS) ×1 IMPLANT
DRAPE C-ARM 42X72 X-RAY (DRAPES) IMPLANT
DRSG TEGADERM 2-3/8X2-3/4 SM (GAUZE/BANDAGES/DRESSINGS) ×8 IMPLANT
DRSG TEGADERM 4X4.75 (GAUZE/BANDAGES/DRESSINGS) ×1 IMPLANT
ELECT REM PT RETURN 9FT ADLT (ELECTROSURGICAL) ×2
ELECTRODE REM PT RTRN 9FT ADLT (ELECTROSURGICAL) ×1 IMPLANT
GLOVE BIOGEL PI IND STRL 6 (GLOVE) IMPLANT
GLOVE BIOGEL PI IND STRL 8 (GLOVE) ×1 IMPLANT
GLOVE BIOGEL PI INDICATOR 6 (GLOVE) ×1
GLOVE BIOGEL PI INDICATOR 8 (GLOVE) ×1
GLOVE ECLIPSE 7.5 STRL STRAW (GLOVE) ×2 IMPLANT
GOWN STRL REUS W/ TWL LRG LVL3 (GOWN DISPOSABLE) ×3 IMPLANT
GOWN STRL REUS W/TWL LRG LVL3 (GOWN DISPOSABLE) ×6
KIT BASIN OR (CUSTOM PROCEDURE TRAY) ×2 IMPLANT
KIT TURNOVER KIT B (KITS) ×2 IMPLANT
NEEDLE 22X1 1/2 (OR ONLY) (NEEDLE) ×1 IMPLANT
NS IRRIG 1000ML POUR BTL (IV SOLUTION) ×2 IMPLANT
PAD ARMBOARD 7.5X6 YLW CONV (MISCELLANEOUS) ×2 IMPLANT
POUCH RETRIEVAL ECOSAC 10 (ENDOMECHANICALS) ×1 IMPLANT
POUCH RETRIEVAL ECOSAC 10MM (ENDOMECHANICALS) ×1
SCISSORS LAP 5X35 DISP (ENDOMECHANICALS) ×2 IMPLANT
SET CHOLANGIOGRAPH 5 50 .035 (SET/KITS/TRAYS/PACK) IMPLANT
SET IRRIG TUBING LAPAROSCOPIC (IRRIGATION / IRRIGATOR) ×2 IMPLANT
SLEEVE ENDOPATH XCEL 5M (ENDOMECHANICALS) ×4 IMPLANT
SPECIMEN JAR SMALL (MISCELLANEOUS) ×2 IMPLANT
STRIP CLOSURE SKIN 1/2X4 (GAUZE/BANDAGES/DRESSINGS) ×2 IMPLANT
SUT MNCRL AB 4-0 PS2 18 (SUTURE) ×2 IMPLANT
SUT SILK 3 0 SH 30 (SUTURE) ×1 IMPLANT
SYR CONTROL 10ML LL (SYRINGE) ×1 IMPLANT
TOWEL OR 17X24 6PK STRL BLUE (TOWEL DISPOSABLE) ×1 IMPLANT
TOWEL OR 17X26 10 PK STRL BLUE (TOWEL DISPOSABLE) ×2 IMPLANT
TRAY LAPAROSCOPIC MC (CUSTOM PROCEDURE TRAY) ×2 IMPLANT
TROCAR XCEL BLUNT TIP 100MML (ENDOMECHANICALS) ×2 IMPLANT
TROCAR XCEL NON-BLD 5MMX100MML (ENDOMECHANICALS) ×2 IMPLANT
TUBING INSUFFLATION (TUBING) ×2 IMPLANT
WATER STERILE IRR 1000ML POUR (IV SOLUTION) ×2 IMPLANT

## 2017-08-17 NOTE — Op Note (Addendum)
OPERATIVE REPORT  DATE OF OPERATION: 08/17/2017  PATIENT:  Austin [name]  84 y.o. male  PRE-OPERATIVE DIAGNOSIS:  Cholelithiasis and acute cholecystitis  POST-OPERATIVE DIAGNOSIS:  Cholelithiasis and acute cholecystitis  INDICATION(S) FOR OPERATION:  History of acute cholecystitis with percutaneous drainage for symptoms  FINDINGS:  Obstructed distal CBD with filling defect.    PROCEDURE:  Procedure(s): LAPAROSCOPIC CHOLECYSTECTOMY WITH INTRAOPERATIVE CHOLANGIOGRAM ERAS PATHWAY  SURGEON:  Surgeon(s): Judeth Horn, MD  ASSISTANT: None  ANESTHESIA:   general  COMPLICATIONS:  None  EBL: <20 ml  BLOOD ADMINISTERED: none  DRAINS: none   SPECIMEN:  Source of Specimen:  Gallbladder and contents  COUNTS CORRECT:  YES  PROCEDURE DETAILS: The patient was taken to the operating room and placed on the table in the supine position.  After an adequate endotracheal anesthetic was administered, the patient was prepped with ChloroPrep, and then draped in the usual manner exposing the entire abdomen laterally, inferiorly and up  to the costal margins.  After a proper timeout was performed including identifying the patient and the procedure to be performed, a supraumbilical 9.4BS midline incision was made using a #15 blade.  This was taken down to the fascia which had an umbilical defect through which we could enter the peritoneal cavity without incising the fascia.  The edges of the fascia were tented up with Kocher clamps as the preperitoneal space was penetrated with a Kelly clamp into the peritoneum.  Once this was done, a pursestring suture of 0 Vicryl was passed around the fascial opening.  This was subsequently used to secure the [facility] cannula which was passed into the peritoneal cavity.  Once the [facility] cannula was in place, carbon dioxide gas was insufflated into the peritoneal cavity up to a maximal intra-abdominal pressure of 27m Hg.The laparoscope, with attached camera and light  source, was passed into the peritoneal cavity to visualize the direct insertion of two right upper quadrant 553mcannulas, and a sup-xiphoid 76m83mannula.  Once all cannulas were in place, the dissection was begun.  Two ratcheted graspers were attached to the dome and infundibulum of the gallbladder and retracted towards the anterior abdominal wall and the right upper quadrant.  Using cautery attached to a dissecting forceps, the peritoneum overlaying the triangle of Chalot and the hepatoduodenal triangle was dissected away exposing the cystic duct and the cystic artery.  A critical window was developed between the CBD and the cystic duct The cystic artery was clipped proximally and distally then transected.  A clip was placed on the gallbladder side of the cystic duct, then a cholecystodochotomy made using the laparoscopic scissors.  Through the cholecystodochotomy a Cook catheter was passed to performed a cholangiogram.  The cholangiogram showed distal CBD obstruction, distal intraductal filling defect, mild dilatation and no flow into the duodenum..  Once the cholangiogram was completed, the [facility], and the distal cystic duct was clipped three times then transected between the clips.  The gallbladder was then dissected out of the hepatic bed without event.  It was retrieved from the abdomen using an Eco Pouchbag without event.  Once the gallbladder was removed, the bed was inspected for hemostasis.  Once excellent hemostasis was obtained all gas and fluids were aspirated from above the liver, then the cannulas were removed.  The supraumbilical incision was closed using the pursestring suture which was in place.  0.25% bupivicaine with epinephrine was injected at all sites.  All 59m39m greater cannula sites were close using a  running subcuticular stitch of 4-0 Monocryl.  5.95m cannula sites were closed with Dermabond only.Steri-Strips and Tagaderm were used to complete the dressings at  all sites.  At this point all needle, sponge, and instrument counts were correct.The patient was awakened from anesthesia and taken to the PACU in stable condition.      JKathryne Eriksson WDahlia Bailiff MD, [phone]  PATIENT DISPOSITION:  PACU - hemodynamically stable.   JJudeth Horn7/26/20198:52 AM
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
## 2017-08-17 NOTE — Anesthesia Procedure Notes (Addendum)
Procedure Name: Intubation Date/Time: 08/17/2017 7:44 AM Performed by: Audry Pili, MD Pre-anesthesia Checklist: Patient identified, Emergency Drugs available, Suction available, Patient being monitored and Timeout performed Patient Re-evaluated:Patient Re-evaluated prior to induction Oxygen Delivery Method: Circle system utilized Preoxygenation: Pre-oxygenation with 100% oxygen Induction Type: IV induction Ventilation: Oral airway inserted - appropriate to patient size and Mask ventilation without difficulty Laryngoscope Size: Glidescope and 4 Grade View: Grade I Tube type: Oral Tube size: 7.5 mm Number of attempts: 1 Airway Equipment and Method: Rigid stylet and Video-laryngoscopy Placement Confirmation: ETT inserted through vocal cords under direct vision,  positive ETCO2 and breath sounds checked- equal and bilateral Secured at: 24 cm Dental Injury: Teeth and Oropharynx as per pre-operative assessment

## 2017-08-17 NOTE — Transfer of Care (Signed)
Immediate Anesthesia Transfer of Care Note  Patient: Austin [name]  Procedure(s) Performed: LAPAROSCOPIC CHOLECYSTECTOMY WITH INTRAOPERATIVE CHOLANGIOGRAM ERAS PATHWAY (N/A Abdomen)  Patient Location: PACU  Anesthesia Type:General  Level of Consciousness: awake, alert  and sedated  Airway & Oxygen Therapy: Patient connected to face mask oxygen  Post-op Assessment: Post -op Vital signs reviewed and stable  Post vital signs: stable  Last Vitals:  Vitals Value Taken Time  BP    Temp    Pulse    Resp    SpO2      Last Pain:  Vitals:   08/17/17 0655  PainSc: 0-No pain      Patients Stated Pain Goal: 3 (04/26/57 1368)  Complications: No apparent anesthesia complications

## 2017-08-17 NOTE — H&P (Signed)
Austin [name] Documented: 05/28/2017 8:33 AM Location: [name] Patient #: [id number] DOB: 03/19/1932 Married / Language: English / Race: Black or African American Male   The patient is a 84 year old male.  Status post percutaneous drainage of gallbladder for acute cholecystitis.  Cystitc duct is obstructed and either Austin [name] will need a permanent GB drain, or Austin [name] will need cholecystectomy.  Allergies (Austin [name], Austin [name]; 05/28/2017 8:35 AM) No Known Drug Allergies [11/21/2016]: Allergies Reconciled   Medication History (Austin [name], [place]; 05/28/2017 8:35 AM) Gabapentin (100MG Capsule, Oral) Active. Lisinopril (10MG Tablet, Oral) Active. Multiple Vitamin (Oral) Active. Omeprazole (40MG Capsule ER, Oral) Active. PredniSONE (20MG Tablet, Oral) Active. Rosuvastatin Calcium (20MG Tablet, Oral) Active. Warfarin Sodium (5MG Tablet, Oral) Active. Medications Reconciled  Vitals (Austin [name]; 05/28/2017 8:34 AM) 05/28/2017 8:33 AM Weight: 175 lb Height: 66.5in Body Surface Area: 1.9 m Body Mass Index: 27.82 kg/m  Temp.: 98.1F  Pulse: 82 (Regular)  BP: 124/86 (Sitting, Left Arm, Standard) BP today. 131/78, P 65   Physical Exam (Austin O. Charmika Macdonnell MD; 05/28/2017 9:08 AM) Abdomen: Gallbladder drain in the RUQ, repoalced last week after it had fallen out. Draining well, amount is unknown. Having chronic diarrhea. Very irritating. Apparently checked for C.diff at the nursing home and it was negative. Draining clear, nonpurulent bile.  Minimal RUQ tenderness. Certainly no peritonitis.  Needs cholecystectomy after cardiac clearance.  Assessment & Plan Austin Rinks O. Tamorah Hada MD; 05/28/2017 9:14 AM) ACUTE CHOLECYSTITIS WITH CHRONIC CHOLECYSTITIS (K81.2) Story: Admitted with sepsis and acute cholecystitis in March 2019, cholecystostomy tube placed which subsequently was pulled out last week. Had drain replaced on May 2. Now draining  well. Impression: Acute cholecystitis with cholecystostomy tube in place. Needs cholecystectomy with IOC when cleared by cardiology. Austin [name] is on coumadin and has atrial fibrillation. Previous CVA. Only other abdominal procedure was PEG Current Plans Pt Education - Austin [name] - Laparoscopic Gallbladder [name]: discussed with patient and provided information. CHOLECYSTOSTOMY CARE (Z43.4) Impression: Draining well. Will try to schedule [name] prior to next tube exchange which will be due in June.  Current Plans Follow-up after evaluation by consultant. Patient to get cardiac clearance and I will speak to Austin [name] and Austin [name] afterwards to schedule [name]. Cardiac clearance has been [name] for [name]  Patient stoppe dhis coumadin on Saturday.  Austin [name]. Austin Bailiff, MD, [place] [phone] Austin [name]
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
## 2017-08-17 NOTE — Anesthesia Postprocedure Evaluation (Signed)
Anesthesia Post Note  Patient: Austin [name]  Procedure(s) Performed: LAPAROSCOPIC CHOLECYSTECTOMY WITH INTRAOPERATIVE CHOLANGIOGRAM ERAS PATHWAY (N/A Abdomen)     Patient location during evaluation: PACU Anesthesia Type: General Level of consciousness: awake and alert Pain management: pain level controlled Vital Signs Assessment: post-procedure vital signs reviewed and stable Respiratory status: spontaneous breathing, nonlabored ventilation and respiratory function stable Cardiovascular status: blood pressure returned to baseline and stable Postop Assessment: no apparent nausea or vomiting Anesthetic complications: no    Last Vitals:  Vitals:   08/17/17 0945 08/17/17 1015  BP: (!) 160/87   Pulse: 72   Resp: 16   Temp: 36.7 C 36.7 C  SpO2: 98%     Last Pain:  Vitals:   08/17/17 0945  PainSc: 0-No pain                 Audry Pili

## 2017-08-18 ENCOUNTER — Encounter (HOSPITAL_COMMUNITY)
Admission: RE | Disposition: A | Discharge status: Skilled Nursing Facility | Payer: Self-pay | Source: Ambulatory Visit | Attending: General Surgery

## 2017-08-18 ENCOUNTER — Ambulatory Visit (HOSPITAL_COMMUNITY): Payer: Medicare Other | Admitting: Certified Registered"

## 2017-08-18 ENCOUNTER — Encounter (HOSPITAL_COMMUNITY): Payer: Self-pay | Admitting: Anesthesiology

## 2017-08-18 ENCOUNTER — Ambulatory Visit (HOSPITAL_COMMUNITY): Payer: Medicare Other

## 2017-08-18 DIAGNOSIS — K219 Gastro-esophageal reflux disease without esophagitis: Secondary | ICD-10-CM | POA: Diagnosis not present

## 2017-08-18 DIAGNOSIS — G8194 Hemiplegia, unspecified affecting left nondominant side: Secondary | ICD-10-CM | POA: Diagnosis not present

## 2017-08-18 DIAGNOSIS — K8050 Calculus of bile duct without cholangitis or cholecystitis without obstruction: Secondary | ICD-10-CM

## 2017-08-18 DIAGNOSIS — Z801 Family history of malignant neoplasm of trachea, bronchus and lung: Secondary | ICD-10-CM | POA: Diagnosis not present

## 2017-08-18 DIAGNOSIS — Q399 Congenital malformation of esophagus, unspecified: Secondary | ICD-10-CM | POA: Diagnosis not present

## 2017-08-18 DIAGNOSIS — K8063 Calculus of gallbladder and bile duct with acute cholecystitis with obstruction: Secondary | ICD-10-CM | POA: Diagnosis not present

## 2017-08-18 DIAGNOSIS — G839 Paralytic syndrome, unspecified: Secondary | ICD-10-CM | POA: Diagnosis not present

## 2017-08-18 DIAGNOSIS — Z86010 Personal history of colonic polyps: Secondary | ICD-10-CM | POA: Diagnosis not present

## 2017-08-18 DIAGNOSIS — Z87891 Personal history of nicotine dependence: Secondary | ICD-10-CM | POA: Diagnosis not present

## 2017-08-18 DIAGNOSIS — I130 Hypertensive heart and chronic kidney disease with heart failure and stage 1 through stage 4 chronic kidney disease, or unspecified chronic kidney disease: Secondary | ICD-10-CM | POA: Diagnosis not present

## 2017-08-18 DIAGNOSIS — Z8546 Personal history of malignant neoplasm of prostate: Secondary | ICD-10-CM | POA: Diagnosis not present

## 2017-08-18 DIAGNOSIS — I480 Paroxysmal atrial fibrillation: Secondary | ICD-10-CM | POA: Diagnosis not present

## 2017-08-18 DIAGNOSIS — N183 Chronic kidney disease, stage 3 (moderate): Secondary | ICD-10-CM | POA: Diagnosis not present

## 2017-08-18 DIAGNOSIS — E785 Hyperlipidemia, unspecified: Secondary | ICD-10-CM | POA: Diagnosis not present

## 2017-08-18 HISTORY — PX: ENDOSCOPIC RETROGRADE CHOLANGIOPANCREATOGRAPHY (ERCP) WITH PROPOFOL: SHX5810

## 2017-08-18 HISTORY — PX: ESOPHAGOGASTRODUODENOSCOPY (EGD) WITH PROPOFOL: SHX5813

## 2017-08-18 LAB — COMPREHENSIVE METABOLIC PANEL
ALT: 25 U/L (ref 0–44)
AST: 33 U/L (ref 15–41)
Albumin: 3 g/dL — ABNORMAL LOW (ref 3.5–5.0)
Alkaline Phosphatase: 102 U/L (ref 38–126)
Anion gap: 9 (ref 5–15)
BUN: 21 mg/dL (ref 8–23)
CO2: 22 mmol/L (ref 22–32)
Calcium: 9.1 mg/dL (ref 8.9–10.3)
Chloride: 107 mmol/L (ref 98–111)
Creatinine, Ser: 1.66 mg/dL — ABNORMAL HIGH (ref 0.61–1.24)
GFR calc Af Amer: 43 mL/min — ABNORMAL LOW (ref >60)
GFR calc non Af Amer: 37 mL/min — ABNORMAL LOW (ref >60)
Glucose, Bld: 127 mg/dL — ABNORMAL HIGH (ref 70–99)
Potassium: 4.6 mmol/L (ref 3.5–5.1)
Sodium: 138 mmol/L (ref 135–145)
Total Bilirubin: 0.6 mg/dL (ref 0.3–1.2)
Total Protein: 6.5 g/dL (ref 6.5–8.1)

## 2017-08-18 LAB — CBC WITH DIFFERENTIAL/PLATELET
Abs Immature Granulocytes: 0.1 10*3/uL (ref 0.0–0.1)
Basophils Absolute: 0 10*3/uL (ref 0.0–0.1)
Basophils Relative: 0 %
Eosinophils Absolute: 0 10*3/uL (ref 0.0–0.7)
Eosinophils Relative: 0 %
HCT: 35.5 % — ABNORMAL LOW (ref 39.0–52.0)
Hemoglobin: 11.3 g/dL — ABNORMAL LOW (ref 13.0–17.0)
Immature Granulocytes: 1 %
Lymphocytes Relative: 8 %
Lymphs Abs: 1.2 10*3/uL (ref 0.7–4.0)
MCH: 30.1 pg (ref 26.0–34.0)
MCHC: 31.8 g/dL (ref 30.0–36.0)
MCV: 94.7 fL (ref 78.0–100.0)
Monocytes Absolute: 1.5 10*3/uL — ABNORMAL HIGH (ref 0.1–1.0)
Monocytes Relative: 10 %
Neutro Abs: 12.4 10*3/uL — ABNORMAL HIGH (ref 1.7–7.7)
Neutrophils Relative %: 81 %
Platelets: 157 10*3/uL (ref 150–400)
RBC: 3.75 MIL/uL — ABNORMAL LOW (ref 4.22–5.81)
RDW: 14.7 % (ref 11.5–15.5)
WBC: 15.2 10*3/uL — ABNORMAL HIGH (ref 4.0–10.5)

## 2017-08-18 LAB — PROTIME-INR
INR: 1.13
Prothrombin Time: 14.4 seconds (ref 11.4–15.2)

## 2017-08-18 SURGERY — ENDOSCOPIC RETROGRADE CHOLANGIOPANCREATOGRAPHY (ERCP) WITH PROPOFOL
Anesthesia: General

## 2017-08-18 SURGERY — ERCP, WITH INTERVENTION IF INDICATED
Anesthesia: General

## 2017-08-18 MED ORDER — FENTANYL CITRATE (PF) 250 MCG/5ML IJ SOLN
INTRAMUSCULAR | Status: DC | PRN
  Administered 2017-08-18 (×2): 25 ug via INTRAVENOUS
  Administered 2017-08-18: 50 ug via INTRAVENOUS
  Administered 2017-08-18 (×2): 25 ug via INTRAVENOUS

## 2017-08-18 MED ORDER — PROPOFOL 10 MG/ML IV BOLUS
INTRAVENOUS | Status: DC | PRN
  Administered 2017-08-18: 120 mg via INTRAVENOUS

## 2017-08-18 MED ORDER — GLUCAGON HCL RDNA (DIAGNOSTIC) 1 MG IJ SOLR
INTRAMUSCULAR | Status: AC
  Filled 2017-08-18: qty 1

## 2017-08-18 MED ORDER — DEXAMETHASONE SODIUM PHOSPHATE 10 MG/ML IJ SOLN
INTRAMUSCULAR | Status: DC | PRN
  Administered 2017-08-18: 10 mg via INTRAVENOUS

## 2017-08-18 MED ORDER — GLUCAGON HCL RDNA (DIAGNOSTIC) 1 MG IJ SOLR
INTRAMUSCULAR | Status: DC | PRN
  Administered 2017-08-18 (×3): .5 mg via INTRAVENOUS

## 2017-08-18 MED ORDER — LIDOCAINE 2% (20 MG/ML) 5 ML SYRINGE
INTRAMUSCULAR | Status: DC | PRN
  Administered 2017-08-18: 40 mg via INTRAVENOUS

## 2017-08-18 MED ORDER — ROCURONIUM BROMIDE 10 MG/ML (PF) SYRINGE
PREFILLED_SYRINGE | INTRAVENOUS | Status: DC | PRN
  Administered 2017-08-18: 30 mg via INTRAVENOUS

## 2017-08-18 MED ORDER — INDOMETHACIN 50 MG RE SUPP
RECTAL | Status: AC
  Filled 2017-08-18: qty 2

## 2017-08-18 MED ORDER — LACTATED RINGERS IV SOLN
INTRAVENOUS | Status: DC
  Administered 2017-08-18: 08:00:00 via INTRAVENOUS

## 2017-08-18 MED ORDER — PHENYLEPHRINE HCL 10 MG/ML IJ SOLN
INTRAMUSCULAR | Status: DC | PRN
  Administered 2017-08-18 (×2): 80 ug via INTRAVENOUS

## 2017-08-18 MED ORDER — SODIUM CHLORIDE 0.9 % IV SOLN: INTRAVENOUS | Status: DC

## 2017-08-18 MED ORDER — IOPAMIDOL (ISOVUE-300) INJECTION 61%
INTRAVENOUS | Status: AC
  Filled 2017-08-18: qty 50

## 2017-08-18 MED ORDER — SUGAMMADEX SODIUM 200 MG/2ML IV SOLN
INTRAVENOUS | Status: DC | PRN
  Administered 2017-08-18: 200 mg via INTRAVENOUS

## 2017-08-18 MED ORDER — SUCCINYLCHOLINE CHLORIDE 200 MG/10ML IV SOSY
PREFILLED_SYRINGE | INTRAVENOUS | Status: DC | PRN
  Administered 2017-08-18: 100 mg via INTRAVENOUS

## 2017-08-18 MED ORDER — ONDANSETRON HCL 4 MG/2ML IJ SOLN
INTRAMUSCULAR | Status: DC | PRN
  Administered 2017-08-18: 4 mg via INTRAVENOUS

## 2017-08-18 MED ORDER — INDOMETHACIN 50 MG RE SUPP
RECTAL | Status: DC | PRN
  Administered 2017-08-18: 100 mg via RECTAL
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
## 2017-08-18 NOTE — Progress Notes (Signed)
Will, PA-C - here to see pt

## 2017-08-18 NOTE — Transfer of Care (Signed)
Immediate Anesthesia Transfer of Care Note  Patient: Austin [name]  Procedure(s) Performed: ENDOSCOPIC RETROGRADE CHOLANGIOPANCREATOGRAPHY (ERCP) WITH PROPOFOL (N/A )  Patient Location: PACU  Anesthesia Type:General  Level of Consciousness: awake, alert  and oriented  Airway & Oxygen Therapy: Patient Spontanous Breathing  Post-op Assessment: Report given to RN and Post -op Vital signs reviewed and stable  Post vital signs: Reviewed and stable  Last Vitals:  Vitals Value Taken Time  BP 132/85 08/18/2017 10:46 AM  Temp    Pulse 69 08/18/2017 10:52 AM  Resp 10 08/18/2017 10:52 AM  SpO2 96 % 08/18/2017 10:52 AM  Vitals shown include unvalidated device data.  Last Pain:  Vitals:   08/18/17 0900  TempSrc:   PainSc: 0-No pain      Patients Stated Pain Goal: 3 (89/48/34 7583)  Complications: No apparent anesthesia complications

## 2017-08-18 NOTE — Interval H&P Note (Signed)
History and Physical Interval Note:  08/18/2017 7:55 AM  [name]  has presented today for surgery, with the diagnosis of CBD stone  The various methods of treatment have been discussed with the patient and family. After consideration of risks, benefits and other options for treatment, the patient has consented to  Procedure(s): ENDOSCOPIC RETROGRADE CHOLANGIOPANCREATOGRAPHY (ERCP) WITH PROPOFOL (N/A) as a surgical intervention .  The patient's history has been reviewed, patient examined, no change in status, stable for surgery.  I have reviewed the patient's chart and labs.  Questions were answered to the patient's satisfaction.     Austin [name]

## 2017-08-18 NOTE — Anesthesia Postprocedure Evaluation (Signed)
Anesthesia Post Note  Patient: Austin [name]  Procedure(s) Performed: ENDOSCOPIC RETROGRADE CHOLANGIOPANCREATOGRAPHY (ERCP) WITH PROPOFOL (N/A ) ESOPHAGOGASTRODUODENOSCOPY (EGD) WITH PROPOFOL (N/A )     Patient location during evaluation: PACU Anesthesia Type: General Level of consciousness: awake and alert Pain management: pain level controlled Vital Signs Assessment: post-procedure vital signs reviewed and stable Respiratory status: spontaneous breathing, nonlabored ventilation, respiratory function stable and patient connected to nasal cannula oxygen Cardiovascular status: blood pressure returned to baseline and stable Postop Assessment: no apparent nausea or vomiting Anesthetic complications: no    Last Vitals:  Vitals:   08/18/17 1140 08/18/17 1234  BP:  (!) 154/84  Pulse: (!) 57 (!) 52  Resp: 10 16  Temp: (!) 36.4 C 36.5 C  SpO2:  100%    Last Pain:  Vitals:   08/18/17 1234  TempSrc: Oral  PainSc:                  [name]

## 2017-08-18 NOTE — Op Note (Signed)
[facility] Patient Name: Austin [name] Procedure Date : 08/18/2017 MRN: [id number] Attending MD: Estill Cotta. Loletha Carrow , MD Date of Birth: [birth year] CSN: [id number] Age: 84 Admit Type: Inpatient Procedure:                ERCP Indications:              Common bile duct stone(s) found on IOC. Mild                            elevation of alkaline phosphatase. Providers:                Estill Cotta. Loletha Carrow, MD, Cleda Daub, RN, Elspeth Cho Tech., Technician, Clearnce Sorrel, CRNA Referring MD:             Judeth Horn, MD; Laurence Spates, MD Medicines:                General Anesthesia, Indomethacin 502 mg PR Complications:            No immediate complications. Estimated Blood Loss:     Estimated blood loss: none. Procedure:                Pre-Anesthesia Assessment:                           - Prior to the procedure, a History and Physical                            was performed, and patient medications and                            allergies were reviewed. The patient's tolerance of                            previous anesthesia was also reviewed. The risks                            and benefits of the procedure and the sedation                            options and risks were discussed with the patient.                            All questions were answered, and informed consent                            was obtained. Prior Anticoagulants: The patient has                            taken Coumadin (warfarin), last dose was 5 days                            prior to procedure. ASA Grade Assessment: III - A  patient with severe systemic disease. After                            reviewing the risks and benefits, the patient was                            deemed in satisfactory condition to undergo the                            procedure.                           After obtaining informed consent, the scope was            passed under direct vision. Throughout the                            procedure, the patient's blood pressure, pulse, and                            oxygen saturations were monitored continuously. The                            [id number]-Q180V ([id number]) Olympus ERCP was introduced                            through the mouth, and used to inject contrast into                            without successful cannulation. The ERCP was                            performed with difficulty due to challenging                            patient position, limited neck mobility and very                            difficult cannulation. The patient tolerated the                            procedure well. Scope In: Scope Out: Findings:      A scout film of the abdomen was obtained. Surgical clips, consistent       with a previous cholecystectomy as well as an IVC filter were seen in       the area of the right upper quadrant of the abdomen. Patient positioning       was very difficult due to limited neck mobility. There was resistance       passing the duodenoscope through the UES, so this scope was withdrawn. A       standard esophagogastroduodenoscopy scope was used for the examination       of the upper gastrointestinal tract. The scope was passed under direct       vision through the upper GI tract. The entire examined esophagus was       significantly tortuous (  UES and esophageal body). The duodenoscope was       then reinserted and slowly advanced through the UES with resistance       after briefly deflating the ETT cuff. The scope was then passed to the       major papila, which was at a somewhat eccentric angle. Redundant tissue       folds, including one at the punctum, made visualization and cannulation       difficult. Bile was flowing freely from the major papilla. Extensive       efforts were made to pass a wire into and cannulate the bile duct. At       one point, an 0.035 inch x 260 cm  straight Hydra Jagwire was       inadvertently passed into the ventral pancreatic duct and then removed.       A smaller diameter sphincterotome was tried, but still the bile dut       could not be cannulated or wire passed into it. The scope was then       withdrawn and the procedure terminated. Total fluoroscopy time 35       seconds. Impression:               - Tortuous esophagus. Recommendation:           - Return patient to hospital ward for ongoing care.                           - Clear liquid diet for 4 hours, then regular diet.                           - Repeat attempt at ERCP in near future with                            advanced biliary endoscopist. Procedure Code(s):        --- Professional ---                           [phone], Esophagogastroduodenoscopy, flexible,                            transoral; diagnostic, including collection of                            specimen(s) by brushing or washing, when performed                            (separate procedure) Diagnosis Code(s):        --- Professional ---                           Q39.9, Congenital malformation of esophagus,                            unspecified                           K80.50, Calculus of bile duct without cholangitis  or cholecystitis without obstruction CPT copyright 2017 [employer]. All rights reserved. The codes documented in this report are preliminary and upon coder review may  be revised to meet current compliance requirements. Shelbylynn Walczyk L. Loletha Carrow, MD 08/18/2017 10:47:51 AM This report has been signed electronically. Number of Addenda: 0
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
## 2017-08-18 NOTE — Anesthesia Procedure Notes (Signed)
Procedure Name: Intubation Date/Time: 08/18/2017 8:19 AM Performed by: Clearnce Sorrel, CRNA Pre-anesthesia Checklist: Patient identified, Emergency Drugs available, Suction available, Patient being monitored and Timeout performed Patient Re-evaluated:Patient Re-evaluated prior to induction Oxygen Delivery Method: Circle system utilized Preoxygenation: Pre-oxygenation with 100% oxygen Induction Type: IV induction Laryngoscope Size: Glidescope (MP 4) Grade View: Grade I Tube type: Oral Tube size: 7.5 mm Number of attempts: 1 Airway Equipment and Method: Rigid stylet Placement Confirmation: ETT inserted through vocal cords under direct vision,  positive ETCO2 and breath sounds checked- equal and bilateral Secured at: 23 cm Tube secured with: Tape Dental Injury: Teeth and Oropharynx as per pre-operative assessment

## 2017-08-18 NOTE — Interval H&P Note (Signed)
History and Physical Interval Note:  08/18/2017 7:54 AM  Austin [name]  has presented today for surgery, with the diagnosis of CBD stone  The various methods of treatment have been discussed with the patient and family. After consideration of risks, benefits and other options for treatment, the patient has consented to  Procedure(s): ENDOSCOPIC RETROGRADE CHOLANGIOPANCREATOGRAPHY (ERCP) WITH PROPOFOL (N/A) as a surgical intervention .  The patient's history has been reviewed, patient examined, no change in status, stable for surgery.  I have reviewed the patient's chart and labs.  Questions were answered to the patient's satisfaction.     I examined [name] and spoke with him and his wife.  INR normal this AM. Explained ERCP and showed computer diagram of anatomy, explaining procedure and risks.  All questions answered and he is agreeable.  Nelida Meuse III

## 2017-08-19 DIAGNOSIS — K8050 Calculus of bile duct without cholangitis or cholecystitis without obstruction: Secondary | ICD-10-CM | POA: Diagnosis not present

## 2017-08-19 DIAGNOSIS — Z7901 Long term (current) use of anticoagulants: Secondary | ICD-10-CM | POA: Diagnosis not present

## 2017-08-19 MED ORDER — WARFARIN SODIUM 5 MG PO TABS
10.0000 mg | ORAL_TABLET | ORAL | Status: AC
  Administered 2017-08-19: 10 mg via ORAL
  Filled 2017-08-19: qty 2

## 2017-08-19 MED ORDER — WARFARIN - PHARMACIST DOSING INPATIENT
Freq: Every day | Status: DC
Start: 2017-08-19 — End: 2017-08-20
  Administered 2017-08-19: 18:00:00

## 2017-08-19 MED ORDER — SODIUM CHLORIDE 0.9% FLUSH
10.0000 mL | INTRAVENOUS | Status: DC | PRN
  Administered 2017-08-21: 10 mL
  Filled 2017-08-19: qty 40
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
## 2017-08-19 NOTE — Progress Notes (Signed)
[facility] PROGRESS NOTE Subjective Patient had an unsuccessful stone removal yesterday.  Was a very difficult procedure because of poor mobility of his neck.  He has been on Coumadin as an outpatient INR was elevated on admission was corrected prior to his cholecystectomy.  He has been on Coumadin because of a history of DVT and atrial fib.  Objective: Vital signs in last 24 hours: Temp:  [97.5 F (36.4 C)-98.1 F (36.7 C)] 97.6 F (36.4 C) (07/28 0559) Pulse Rate:  [52-75] 55 (07/28 0559) Resp:  [9-17] 17 (07/28 0559) BP: (125-158)/(72-85) 158/85 (07/28 0559) SpO2:  [95 %-100 %] 98 % (07/28 0559) Last BM Date: 08/16/17  Intake/Output from previous day: 07/27 0701 - 07/28 0700 In: 2761.8 [P.O.:900; I.V.:1861.8] Out: 1451 [Urine:1450; Blood:1] Intake/Output this shift: No intake/output data recorded.  PE:  Abdomen--nontender  Lab Results: Recent Labs    08/18/17 0546  WBC 15.2*  HGB 11.3*  HCT 35.5*  PLT 157   BMET Recent Labs    08/18/17 0546  NA 138  K 4.6  CL 107  CO2 22  CREATININE 1.66*   LFT Recent Labs    08/18/17 0546  PROT 6.5  AST 33  ALT 25  ALKPHOS 102  BILITOT 0.6   PT/INR Recent Labs    08/17/17 0726 08/18/17 0546  LABPROT 14.3 14.4  INR 1.12 1.13   PANCREAS No results for input(s): LIPASE in the last 72 hours.       Studies/Results: Dg Cholangiogram Operative  Result Date: 08/17/2017 CLINICAL DATA:  Intraoperative cholangiogram EXAM: INTRAOPERATIVE CHOLANGIOGRAM TECHNIQUE: Cholangiographic images from the C-arm fluoroscopic device were submitted for interpretation post-operatively. Please see the procedural report for the amount of contrast and the fluoroscopy time utilized. COMPARISON:  None. FINDINGS: There are filling defects in the distal common bile duct. Contrast fills the biliary tree. Contrast does not enter the duodenum. An IVC filter is partially visualized. IMPRESSION: Findings are consistent with distal  common bile duct obstruction secondary to duct stones. ERCP is recommended. Electronically Signed   By: Marybelle Killings M.D.   On: 08/17/2017 09:00    Medications: I have reviewed the patient's current medications.  Assessment:   1.  Unsuccessful stone removal.  Was difficult because of the patient's neck and poor mobility.  Options would be referral to a [facility] or have Dr. Watt Climes try one more time here in [place].  Discussed all this with the patient and his wife.  Do not feel that he needs to remain in the hospital at this point.    Plan: We will discuss patient with Dr. Watt Climes next week and see if he wants to give ERCP another attempt here in [facility] or feels it would be best to refer him to a [facility]. I would go ahead and resume his Coumadin.   Austin [name] 08/19/2017, 8:36 AM  This note was created using voice recognition software. Minor errors may Have occurred unintentionally.  Pager: [phone] If no answer or after hours call [phone]
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
## 2017-08-20 ENCOUNTER — Other Ambulatory Visit: Payer: Self-pay | Admitting: Gastroenterology

## 2017-08-20 ENCOUNTER — Ambulatory Visit: Payer: Self-pay

## 2017-08-20 ENCOUNTER — Encounter (HOSPITAL_COMMUNITY)
Admission: RE | Disposition: A | Discharge status: Skilled Nursing Facility | Payer: Self-pay | Source: Ambulatory Visit | Attending: General Surgery

## 2017-08-20 ENCOUNTER — Encounter (HOSPITAL_COMMUNITY): Payer: Self-pay | Admitting: Gastroenterology

## 2017-08-20 LAB — HEPARIN LEVEL (UNFRACTIONATED): Heparin Unfractionated: 0.74 IU/mL — ABNORMAL HIGH (ref 0.30–0.70)

## 2017-08-20 LAB — COMPREHENSIVE METABOLIC PANEL
ALT: 23 U/L (ref 0–44)
AST: 23 U/L (ref 15–41)
Albumin: 2.9 g/dL — ABNORMAL LOW (ref 3.5–5.0)
Alkaline Phosphatase: 101 U/L (ref 38–126)
Anion gap: 4 — ABNORMAL LOW (ref 5–15)
BUN: 21 mg/dL (ref 8–23)
CO2: 26 mmol/L (ref 22–32)
Calcium: 8.6 mg/dL — ABNORMAL LOW (ref 8.9–10.3)
Chloride: 109 mmol/L (ref 98–111)
Creatinine, Ser: 1.51 mg/dL — ABNORMAL HIGH (ref 0.61–1.24)
GFR calc Af Amer: 48 mL/min — ABNORMAL LOW (ref >60)
GFR calc non Af Amer: 41 mL/min — ABNORMAL LOW (ref >60)
Glucose, Bld: 133 mg/dL — ABNORMAL HIGH (ref 70–99)
Potassium: 3.9 mmol/L (ref 3.5–5.1)
Sodium: 139 mmol/L (ref 135–145)
Total Bilirubin: 0.5 mg/dL (ref 0.3–1.2)
Total Protein: 6.2 g/dL — ABNORMAL LOW (ref 6.5–8.1)

## 2017-08-20 LAB — CBC WITH DIFFERENTIAL/PLATELET
Abs Immature Granulocytes: 0.1 10*3/uL (ref 0.0–0.1)
Basophils Absolute: 0 10*3/uL (ref 0.0–0.1)
Basophils Relative: 0 %
Eosinophils Absolute: 0.3 10*3/uL (ref 0.0–0.7)
Eosinophils Relative: 3 %
HCT: 34.1 % — ABNORMAL LOW (ref 39.0–52.0)
Hemoglobin: 10.7 g/dL — ABNORMAL LOW (ref 13.0–17.0)
Immature Granulocytes: 1 %
Lymphocytes Relative: 13 %
Lymphs Abs: 1.3 10*3/uL (ref 0.7–4.0)
MCH: 30 pg (ref 26.0–34.0)
MCHC: 31.4 g/dL (ref 30.0–36.0)
MCV: 95.5 fL (ref 78.0–100.0)
Monocytes Absolute: 1.3 10*3/uL — ABNORMAL HIGH (ref 0.1–1.0)
Monocytes Relative: 13 %
Neutro Abs: 7 10*3/uL (ref 1.7–7.7)
Neutrophils Relative %: 70 %
Platelets: 157 10*3/uL (ref 150–400)
RBC: 3.57 MIL/uL — ABNORMAL LOW (ref 4.22–5.81)
RDW: 14.8 % (ref 11.5–15.5)
WBC: 10 10*3/uL (ref 4.0–10.5)

## 2017-08-20 LAB — PROTIME-INR
INR: 1.19
INR: 1.38
Prothrombin Time: 15 seconds (ref 11.4–15.2)
Prothrombin Time: 16.9 seconds — ABNORMAL HIGH (ref 11.4–15.2)

## 2017-08-20 SURGERY — ENDOSCOPIC RETROGRADE CHOLANGIOPANCREATOGRAPHY (ERCP) WITH PROPOFOL
Anesthesia: General

## 2017-08-20 MED ORDER — HEPARIN (PORCINE) IN NACL 100-0.45 UNIT/ML-% IJ SOLN
1100.0000 [IU]/h | INTRAMUSCULAR | Status: AC
Start: 2017-08-20 — End: 2017-08-21
  Administered 2017-08-20: 1200 [IU]/h via INTRAVENOUS
  Filled 2017-08-20: qty 250

## 2017-08-20 MED ORDER — HEPARIN BOLUS VIA INFUSION
4000.0000 [IU] | Freq: Once | INTRAVENOUS | Status: AC
  Administered 2017-08-20: 4000 [IU] via INTRAVENOUS
  Filled 2017-08-20: qty 4000
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
## 2017-08-20 NOTE — Progress Notes (Signed)
[facility] Progress Note Subjective: Patient doing well without complaints.  ERCP attempt over the weekend unsuccessful.  Dr. Watt Climes to try again tomorrow.  Will hold coumadin and start heparin.  Objective: Vital signs in last 24 hours: Temp:  [98.2 F (36.8 C)-98.3 F (36.8 C)] 98.3 F (36.8 C) (07/29 0451) Pulse Rate:  [57-71] 57 (07/29 0451) Resp:  [16] 16 (07/29 0451) BP: (151-168)/(71-88) 168/76 (07/29 0451) SpO2:  [97 %-98 %] 98 % (07/29 0451) Last BM Date: 08/18/17  Intake/Output from previous day: 07/28 0701 - 07/29 0700 In: 1255.7 [P.O.:454; I.V.:801.7] Out: 2250 [Urine:2250] Intake/Output this shift: No intake/output data recorded.  Lungs: Clear  Abd: Soft, good bowel sounds.  Not tender.  Having bowel movements.  Extremities: No changes  Neuro: No new deficits.  Lab Results: CBC  Recent Labs    08/18/17 0546  WBC 15.2*  HGB 11.3*  HCT 35.5*  PLT 157   BMET Recent Labs    08/18/17 0546  NA 138  K 4.6  CL 107  CO2 22  GLUCOSE 127*  BUN 21  CREATININE 1.66*  CALCIUM 9.1   PT/INR Recent Labs    08/18/17 0546 08/20/17 0328  LABPROT 14.4 15.0  INR 1.13 1.19   ABG No results for input(s): PHART, HCO3 in the last 72 hours.  Invalid input(s): PCO2, PO2  Studies/Results: No results found.  Anti-infectives: Anti-infectives (From admission, onward)   Start     Dose/Rate Route Frequency Ordered Stop   08/17/17 1930  cefoTEtan (CEFOTAN) 2 g in sodium chloride 0.9 % 100 mL IVPB     2 g 200 mL/hr over 30 Minutes Intravenous Every 12 hours 08/17/17 1030 08/17/17 2257   08/17/17 0630  cefoTEtan (CEFOTAN) 2 g in sodium chloride 0.9 % 100 mL IVPB     2 g 200 mL/hr over 30 Minutes Intravenous On call to O.R. 08/17/17 1191 08/17/17 0810      Assessment/Plan: s/p Procedure(s): ENDOSCOPIC RETROGRADE CHOLANGIOPANCREATOGRAPHY (ERCP) WITH PROPOFOL ESOPHAGOGASTRODUODENOSCOPY (EGD) WITH PROPOFOL Stop coumadin and start heparin  Patient for repeat ERCP  tomorrow.  LOS: 0 days    Kathryne Eriksson. Dahlia Bailiff, MD, FACS [phone] [facility] Surgery 08/20/2017

## 2017-08-20 NOTE — Addendum Note (Signed)
Addended byClarene Essex on: 08/20/2017 09:47 AM   Modules accepted: Orders

## 2017-08-21 ENCOUNTER — Encounter (HOSPITAL_COMMUNITY): Payer: Self-pay | Admitting: *Deleted

## 2017-08-21 ENCOUNTER — Ambulatory Visit (HOSPITAL_COMMUNITY): Payer: Medicare Other

## 2017-08-21 ENCOUNTER — Ambulatory Visit (HOSPITAL_COMMUNITY): Payer: Medicare Other | Admitting: Anesthesiology

## 2017-08-21 ENCOUNTER — Encounter (HOSPITAL_COMMUNITY)
Admission: RE | Disposition: A | Discharge status: Skilled Nursing Facility | Payer: Self-pay | Source: Ambulatory Visit | Attending: General Surgery

## 2017-08-21 ENCOUNTER — Other Ambulatory Visit: Payer: Self-pay

## 2017-08-21 ENCOUNTER — Ambulatory Visit: Payer: Self-pay | Admitting: *Deleted

## 2017-08-21 ENCOUNTER — Ambulatory Visit (HOSPITAL_COMMUNITY): Admission: RE | Admit: 2017-08-21 | Payer: Medicare Other | Source: Ambulatory Visit | Admitting: Gastroenterology

## 2017-08-21 DIAGNOSIS — Z48815 Encounter for surgical aftercare following surgery on the digestive system: Secondary | ICD-10-CM | POA: Diagnosis not present

## 2017-08-21 DIAGNOSIS — R279 Unspecified lack of coordination: Secondary | ICD-10-CM | POA: Diagnosis not present

## 2017-08-21 DIAGNOSIS — Z978 Presence of other specified devices: Secondary | ICD-10-CM | POA: Diagnosis not present

## 2017-08-21 DIAGNOSIS — K8063 Calculus of gallbladder and bile duct with acute cholecystitis with obstruction: Secondary | ICD-10-CM | POA: Diagnosis not present

## 2017-08-21 DIAGNOSIS — Z86718 Personal history of other venous thrombosis and embolism: Secondary | ICD-10-CM | POA: Diagnosis not present

## 2017-08-21 DIAGNOSIS — I509 Heart failure, unspecified: Secondary | ICD-10-CM | POA: Diagnosis present

## 2017-08-21 DIAGNOSIS — Z801 Family history of malignant neoplasm of trachea, bronchus and lung: Secondary | ICD-10-CM | POA: Diagnosis not present

## 2017-08-21 DIAGNOSIS — E785 Hyperlipidemia, unspecified: Secondary | ICD-10-CM | POA: Diagnosis not present

## 2017-08-21 DIAGNOSIS — I739 Peripheral vascular disease, unspecified: Secondary | ICD-10-CM | POA: Diagnosis present

## 2017-08-21 DIAGNOSIS — Z967 Presence of other bone and tendon implants: Secondary | ICD-10-CM | POA: Diagnosis not present

## 2017-08-21 DIAGNOSIS — K831 Obstruction of bile duct: Secondary | ICD-10-CM | POA: Diagnosis not present

## 2017-08-21 DIAGNOSIS — G8114 Spastic hemiplegia affecting left nondominant side: Secondary | ICD-10-CM | POA: Diagnosis not present

## 2017-08-21 DIAGNOSIS — I129 Hypertensive chronic kidney disease with stage 1 through stage 4 chronic kidney disease, or unspecified chronic kidney disease: Secondary | ICD-10-CM | POA: Diagnosis not present

## 2017-08-21 DIAGNOSIS — Z8781 Personal history of (healed) traumatic fracture: Secondary | ICD-10-CM | POA: Diagnosis not present

## 2017-08-21 DIAGNOSIS — K8050 Calculus of bile duct without cholangitis or cholecystitis without obstruction: Secondary | ICD-10-CM | POA: Diagnosis not present

## 2017-08-21 DIAGNOSIS — Z86010 Personal history of colonic polyps: Secondary | ICD-10-CM | POA: Diagnosis not present

## 2017-08-21 DIAGNOSIS — M6281 Muscle weakness (generalized): Secondary | ICD-10-CM | POA: Diagnosis not present

## 2017-08-21 DIAGNOSIS — Z9049 Acquired absence of other specified parts of digestive tract: Secondary | ICD-10-CM | POA: Diagnosis not present

## 2017-08-21 DIAGNOSIS — Z8546 Personal history of malignant neoplasm of prostate: Secondary | ICD-10-CM | POA: Diagnosis not present

## 2017-08-21 DIAGNOSIS — Z4659 Encounter for fitting and adjustment of other gastrointestinal appliance and device: Secondary | ICD-10-CM | POA: Diagnosis not present

## 2017-08-21 DIAGNOSIS — Z743 Need for continuous supervision: Secondary | ICD-10-CM | POA: Diagnosis not present

## 2017-08-21 DIAGNOSIS — K810 Acute cholecystitis: Secondary | ICD-10-CM | POA: Diagnosis not present

## 2017-08-21 DIAGNOSIS — Z9181 History of falling: Secondary | ICD-10-CM | POA: Diagnosis not present

## 2017-08-21 DIAGNOSIS — K8042 Calculus of bile duct with acute cholecystitis without obstruction: Secondary | ICD-10-CM | POA: Diagnosis not present

## 2017-08-21 DIAGNOSIS — I480 Paroxysmal atrial fibrillation: Secondary | ICD-10-CM | POA: Diagnosis not present

## 2017-08-21 DIAGNOSIS — Z87891 Personal history of nicotine dependence: Secondary | ICD-10-CM | POA: Diagnosis not present

## 2017-08-21 DIAGNOSIS — Z7901 Long term (current) use of anticoagulants: Secondary | ICD-10-CM | POA: Diagnosis not present

## 2017-08-21 DIAGNOSIS — N183 Chronic kidney disease, stage 3 (moderate): Secondary | ICD-10-CM | POA: Diagnosis not present

## 2017-08-21 DIAGNOSIS — K219 Gastro-esophageal reflux disease without esophagitis: Secondary | ICD-10-CM | POA: Diagnosis not present

## 2017-08-21 DIAGNOSIS — R5381 Other malaise: Secondary | ICD-10-CM | POA: Diagnosis not present

## 2017-08-21 DIAGNOSIS — G8194 Hemiplegia, unspecified affecting left nondominant side: Secondary | ICD-10-CM | POA: Diagnosis not present

## 2017-08-21 DIAGNOSIS — G839 Paralytic syndrome, unspecified: Secondary | ICD-10-CM | POA: Diagnosis not present

## 2017-08-21 DIAGNOSIS — I130 Hypertensive heart and chronic kidney disease with heart failure and stage 1 through stage 4 chronic kidney disease, or unspecified chronic kidney disease: Secondary | ICD-10-CM | POA: Diagnosis not present

## 2017-08-21 DIAGNOSIS — S72065D Nondisplaced articular fracture of head of left femur, subsequent encounter for closed fracture with routine healing: Secondary | ICD-10-CM | POA: Diagnosis not present

## 2017-08-21 HISTORY — PX: ERCP: SHX5425

## 2017-08-21 HISTORY — PX: SPHINCTEROTOMY: SHX5544

## 2017-08-21 HISTORY — PX: PANCREATIC STENT PLACEMENT: SHX5539

## 2017-08-21 HISTORY — PX: REMOVAL OF STONES: SHX5545

## 2017-08-21 LAB — PROTIME-INR
INR: 1.21
Prothrombin Time: 15.2 seconds (ref 11.4–15.2)

## 2017-08-21 LAB — CBC
HCT: 34.8 % — ABNORMAL LOW (ref 39.0–52.0)
Hemoglobin: 10.9 g/dL — ABNORMAL LOW (ref 13.0–17.0)
MCH: 30.1 pg (ref 26.0–34.0)
MCHC: 31.3 g/dL (ref 30.0–36.0)
MCV: 96.1 fL (ref 78.0–100.0)
Platelets: 145 10*3/uL — ABNORMAL LOW (ref 150–400)
RBC: 3.62 MIL/uL — ABNORMAL LOW (ref 4.22–5.81)
RDW: 14.9 % (ref 11.5–15.5)
WBC: 7.7 10*3/uL (ref 4.0–10.5)

## 2017-08-21 LAB — HEPARIN LEVEL (UNFRACTIONATED): Heparin Unfractionated: <0.1 IU/mL — ABNORMAL LOW (ref 0.30–0.70)

## 2017-08-21 SURGERY — ERCP, WITH INTERVENTION IF INDICATED
Anesthesia: General

## 2017-08-21 MED ORDER — CIPROFLOXACIN IN D5W 400 MG/200ML IV SOLN
400.0000 mg | Freq: Once | INTRAVENOUS | Status: AC
  Administered 2017-08-21: 400 mg via INTRAVENOUS

## 2017-08-21 MED ORDER — LACTATED RINGERS IV SOLN
INTRAVENOUS | Status: DC
  Administered 2017-08-21: 07:00:00 via INTRAVENOUS

## 2017-08-21 MED ORDER — HEPARIN (PORCINE) IN NACL 100-0.45 UNIT/ML-% IJ SOLN
1100.0000 [IU]/h | INTRAMUSCULAR | Status: DC
  Administered 2017-08-21: 1100 [IU]/h via INTRAVENOUS
  Filled 2017-08-21: qty 250

## 2017-08-21 MED ORDER — INDOMETHACIN 50 MG RE SUPP
RECTAL | Status: AC
  Filled 2017-08-21: qty 2

## 2017-08-21 MED ORDER — FENTANYL CITRATE (PF) 100 MCG/2ML IJ SOLN: 25.0000 ug | INTRAMUSCULAR | Status: DC | PRN

## 2017-08-21 MED ORDER — CIPROFLOXACIN IN D5W 400 MG/200ML IV SOLN
INTRAVENOUS | Status: AC
  Filled 2017-08-21: qty 200

## 2017-08-21 MED ORDER — ROCURONIUM BROMIDE 100 MG/10ML IV SOLN
INTRAVENOUS | Status: DC | PRN
  Administered 2017-08-21: 60 mg via INTRAVENOUS

## 2017-08-21 MED ORDER — ONDANSETRON HCL 4 MG/2ML IJ SOLN
INTRAMUSCULAR | Status: DC | PRN
  Administered 2017-08-21: 4 mg via INTRAVENOUS

## 2017-08-21 MED ORDER — PROPOFOL 10 MG/ML IV BOLUS
INTRAVENOUS | Status: DC | PRN
  Administered 2017-08-21: 130 mg via INTRAVENOUS

## 2017-08-21 MED ORDER — LIDOCAINE HCL (CARDIAC) PF 100 MG/5ML IV SOSY
PREFILLED_SYRINGE | INTRAVENOUS | Status: DC | PRN
  Administered 2017-08-21: 100 mg via INTRAVENOUS

## 2017-08-21 MED ORDER — FENTANYL CITRATE (PF) 100 MCG/2ML IJ SOLN
INTRAMUSCULAR | Status: DC | PRN
  Administered 2017-08-21: 50 ug via INTRAVENOUS

## 2017-08-21 MED ORDER — IOPAMIDOL (ISOVUE-300) INJECTION 61%
INTRAVENOUS | Status: AC
  Filled 2017-08-21: qty 50

## 2017-08-21 MED ORDER — SODIUM CHLORIDE 0.9 % IV SOLN
INTRAVENOUS | Status: DC | PRN
  Administered 2017-08-21: 25 ug/min via INTRAVENOUS

## 2017-08-21 MED ORDER — SUCCINYLCHOLINE CHLORIDE 20 MG/ML IJ SOLN
INTRAMUSCULAR | Status: DC | PRN
  Administered 2017-08-21: 120 mg via INTRAVENOUS

## 2017-08-21 MED ORDER — INDOMETHACIN 50 MG RE SUPP
RECTAL | Status: DC | PRN
  Administered 2017-08-21: 100 mg via RECTAL

## 2017-08-21 MED ORDER — GLUCAGON HCL RDNA (DIAGNOSTIC) 1 MG IJ SOLR
INTRAMUSCULAR | Status: AC
  Filled 2017-08-21: qty 1

## 2017-08-21 MED ORDER — SODIUM CHLORIDE 0.9 % IV SOLN
INTRAVENOUS | Status: DC | PRN
  Administered 2017-08-21: 50 mL

## 2017-08-21 MED ORDER — SUGAMMADEX SODIUM 500 MG/5ML IV SOLN
INTRAVENOUS | Status: DC | PRN
  Administered 2017-08-21: 400 mg via INTRAVENOUS

## 2017-08-21 NOTE — Anesthesia Procedure Notes (Signed)
Procedure Name: Intubation Date/Time: 08/21/2017 8:03 AM Performed by: Kyung Rudd, CRNA Pre-anesthesia Checklist: Patient identified, Emergency Drugs available, Suction available and Patient being monitored Patient Re-evaluated:Patient Re-evaluated prior to induction Oxygen Delivery Method: Circle system utilized Preoxygenation: Pre-oxygenation with 100% oxygen Induction Type: IV induction Laryngoscope Size: Glidescope and 4 Tube type: Oral Tube size: 7.5 mm Number of attempts: 1 Airway Equipment and Method: Stylet and Video-laryngoscopy Placement Confirmation: ETT inserted through vocal cords under direct vision,  positive ETCO2 and breath sounds checked- equal and bilateral Secured at: 22 cm Tube secured with: Tape Dental Injury: Teeth and Oropharynx as per pre-operative assessment  Comments: Electively used Glidescope due to limited neck mobility. Grade I view on Glidescope screen.

## 2017-08-21 NOTE — Transfer of Care (Signed)
Immediate Anesthesia Transfer of Care Note  Patient: Austin [name]  Procedure(s) Performed: ENDOSCOPIC RETROGRADE CHOLANGIOPANCREATOGRAPHY (ERCP) (N/A ) SPHINCTEROTOMY REMOVAL OF STONES PANCREATIC STENT PLACEMENT  Patient Location: Endoscopy Unit  Anesthesia Type:General  Level of Consciousness: awake, alert  and oriented  Airway & Oxygen Therapy: Patient Spontanous Breathing and Patient connected to nasal cannula oxygen  Post-op Assessment: Report given to RN and Post -op Vital signs reviewed and stable  Post vital signs: Reviewed and stable  Last Vitals:  Vitals Value Taken Time  BP 196/77 08/21/2017  9:58 AM  Temp    Pulse 72 08/21/2017 10:00 AM  Resp 20 08/21/2017 10:00 AM  SpO2 100 % 08/21/2017 10:00 AM  Vitals shown include unvalidated device data.  Last Pain:  Vitals:   08/21/17 0717  TempSrc:   PainSc: 8       Patients Stated Pain Goal: 3 (95/32/02 3343)  Complications: No apparent anesthesia complications

## 2017-08-21 NOTE — Anesthesia Preprocedure Evaluation (Signed)
Anesthesia Evaluation  Patient identified by MRN, date of birth, ID band Patient awake    Reviewed: Allergy & Precautions, Patient's Chart, lab work & pertinent test results  Airway Mallampati: II  TM Distance: >3 FB     Dental   Pulmonary pneumonia, former smoker,    breath sounds clear to auscultation       Cardiovascular hypertension, + DVT   Rhythm:Regular Rate:Normal     Neuro/Psych    GI/Hepatic PUD, GERD  ,  Endo/Other    Renal/GU Renal disease     Musculoskeletal   Abdominal   Peds  Hematology  (+) anemia ,   Anesthesia Other Findings   Reproductive/Obstetrics                             Anesthesia Physical Anesthesia Plan  ASA: III  Anesthesia Plan: General   Post-op Pain Management:    Induction: Intravenous  PONV Risk Score and Plan: Treatment may vary due to age or medical condition  Airway Management Planned: Oral ETT  Additional Equipment:   Intra-op Plan:   Post-operative Plan: Possible Post-op intubation/ventilation  Informed Consent: I have reviewed the patients History and Physical, chart, labs and discussed the procedure including the risks, benefits and alternatives for the proposed anesthesia with the patient or authorized representative who has indicated his/her understanding and acceptance.   Dental advisory given  Plan Discussed with:   Anesthesia Plan Comments:         Anesthesia Quick Evaluation

## 2017-08-21 NOTE — Op Note (Signed)
[facility] Patient Name: Austin [name] Procedure Date : 08/21/2017 MRN: [id number] Attending MD: Clarene Essex , MD Date of Birth: 1932-10-08 CSN: [id number] Age: 84 Admit Type: Inpatient Procedure:                ERCP Indications:              Bile duct stone(s) Providers:                Clarene Essex, MD, Cleda Daub, RN, William Dalton,                            Technician Referring MD:              Medicines:                General Anesthesia Complications:            No immediate complications. Estimated Blood Loss:     Estimated blood loss: none. Procedure:                Pre-Anesthesia Assessment:                           - Prior to the procedure, a History and Physical                            was performed, and patient medications and                            allergies were reviewed. The patient's tolerance of                            previous anesthesia was also reviewed. The risks                            and benefits of the procedure and the sedation                            options and risks were discussed with the patient.                            All questions were answered, and informed consent                            was obtained. Prior Anticoagulants: The patient has                            taken heparin, last dose was day of procedure. ASA                            Grade Assessment: III - A patient with severe                            systemic disease. After reviewing the risks and  benefits, the patient was deemed in satisfactory                            condition to undergo the procedure.                           After obtaining informed consent, the scope was                            passed under direct vision. Throughout the                            procedure, the patient's blood pressure, pulse, and                            oxygen saturations were monitored continuously. The                             [id number] ([id number]) Olympus ERCP was introduced                            through the mouth, and used to inject contrast into                            and used to cannulate the bile duct. The ERCP was                            somewhat difficult due to abnormal anatomy.                            Successful completion of the procedure was aided by                            performing the maneuvers documented (below) in this                            report. The patient tolerated the procedure well.                            The procedure was done with him completely on his                            side Scope In: Scope Out: Findings:      The major papilla had a prominent extra fold which made cannulation       difficult. and after a prolonged effort we were able to get the wire to       go up the pancreatic duct and and some dye was injected to confirm and       we placed One 5 Fr by 3 cm plastic stent with a 3/4 external pigtail and       no internal flaps was placed 2.5 cm into the ventral pancreatic duct.       Clear fluid flowed through the stent. The stent was in good position. we  then proceeded with cannulating the CBD next to the pancreatic stent       unfortunately the wire and the sphincterotome did go a few times into       the pancreas but we were able to get deep selective cannulation and on       the initial injection obvious CBD stone was seen and we proceeded with       the Biliary sphincterotomy which was made with a Hydratome       sphincterotome using ERBE electrocautery. There was no       post-sphincterotomy bleeding.we had adequate biliary drainage and could       get the fully bowed sphincterotome in and out of the duct and then The       biliary tree was swept with a 9-12 mm adjustable balloon starting at the       upper third of the main bile duct. One stone was removed. No stones       remained. multiple balloon pull-through is using  both the 9 mm size and       the 12 mm size were done and both passed readily through the patent       sphincterotomy site and we then proceeded with an occlusion       cholangiogram in the customary fashion that did not reveal any residual       filling defects the wire and the balloon and endoscope were removed and       the patient tolerated the procedure well there was no obvious immediate       complication Impression:               - The major papilla appeared to have a prominent                            fold.                           - Choledocholithiasis was found. Complete removal                            was accomplished by biliary sphincterotomy and                            balloon extraction.                           - One plastic stent was placed into the ventral                            pancreatic duct 2. With cannulation and decreased                            risk of pancreatitis.                           - A biliary sphincterotomy was performed.                           - The biliary tree was swept multiple times and no  filling defect was seen at the end of the procedure. Recommendation:           - Clear liquid diet for 6 hours. may slowly advance                            later today to soft solids if doing well- Resume                            heparin at prior dose today at 4 PM if doing well                            without bolus. and if doing well tomorrow may                            resume Coumadin at that time                           - Return to GI clinic PRN.                           - Telephone GI clinic if symptomatic PRN.                           - Check liver enzymes (AST, ALT, alkaline                            phosphatase, bilirubin) and hemogram with white                            blood cell count and platelets in the morning.                           - Perform a flat plate and upright abdominal x-ray                             in 1 week to confirm the pancreatic stent fell out                            and make sure I get a copy of that report. Procedure Code(s):        --- Professional ---                           [phone], Endoscopic retrograde                            cholangiopancreatography (ERCP); with placement of                            endoscopic stent into biliary or pancreatic duct,                            including pre- and post-dilation and guide wire  passage, when performed, including sphincterotomy,                            when performed, each stent                           43264, Endoscopic retrograde                            cholangiopancreatography (ERCP); with removal of                            calculi/debris from biliary/pancreatic duct(s)                           43262, 59, Endoscopic retrograde                            cholangiopancreatography (ERCP); with                            sphincterotomy/papillotomy Diagnosis Code(s):        --- Professional ---                           K80.50, Calculus of bile duct without cholangitis                            or cholecystitis without obstruction                           K83.8, Other specified diseases of biliary tract CPT copyright 2017 [employer]. All rights reserved. The codes documented in this report are preliminary and upon coder review may  be revised to meet current compliance requirements. Clarene Essex, MD 08/21/2017 9:47:55 AM This report has been signed electronically. Number of Addenda: 0

## 2017-08-21 NOTE — Progress Notes (Addendum)
GS Progress Note Subjective: Patient just back from ERCP.  Stone removed successfully.  Objective: Vital signs in last 24 hours: Temp:  [97.6 F (36.4 C)-99 F (37.2 C)] 97.6 F (36.4 C) (07/30 1041) Pulse Rate:  [57-73] 68 (07/30 1041) Resp:  [13-17] 16 (07/30 1041) BP: (146-205)/(64-107) 175/72 (07/30 1041) SpO2:  [97 %-100 %] 97 % (07/30 1041) Weight:  [81.6 kg (180 lb)] 81.6 kg (180 lb) (07/30 0717) Last BM Date: 08/20/17  Intake/Output from previous day: 07/29 0701 - 07/30 0700 In: 5118 [P.O.:1440; I.V.:3678] Out: 1350 [Urine:1350] Intake/Output this shift: Total I/O In: 250 [I.V.:250] Out: 400 [Urine:400]  Lungs: Clear  Abd: Good bowel sounds.  Not tender.  Extremities: No changes  Neuro: No changes  Lab Results: CBC  Recent Labs    08/20/17 1335 08/21/17 0435  WBC 10.0 7.7  HGB 10.7* 10.9*  HCT 34.1* 34.8*  PLT 157 145*   BMET Recent Labs    08/20/17 1335  NA 139  K 3.9  CL 109  CO2 26  GLUCOSE 133*  BUN 21  CREATININE 1.51*  CALCIUM 8.6*   PT/INR Recent Labs    08/20/17 1335 08/21/17 0435  LABPROT 16.9* 15.2  INR 1.38 1.21   ABG No results for input(s): PHART, HCO3 in the last 72 hours.  Invalid input(s): PCO2, PO2  Studies/Results: Dg Ercp Biliary & Pancreatic Ducts  Result Date: 08/21/2017 CLINICAL DATA:  84 year old male with a history of choledocholithiasis EXAM: ERCP TECHNIQUE: Multiple spot images obtained with the fluoroscopic device and submitted for interpretation post-procedure. FLUOROSCOPY TIME:  Fluoroscopy Time:  8 minutes 53 seconds COMPARISON:  08/17/2017 FINDINGS: Single limited intraoperative fluoroscopic spot images during ERCP. Image demonstrates endoscope projecting over the upper abdomen with partial opacification of the extrahepatic bile ducts and a safety wire in place. Partially visualized IVC filter IMPRESSION: Limited images during ERCP, demonstrates partial opacification of the extra hepatic biliary system.  Please refer to the dictated operative report for full details of intraoperative findings and procedure. Electronically Signed   By: Corrie Mckusick D.O.   On: 08/21/2017 09:56    Anti-infectives: Anti-infectives (From admission, onward)   Start     Dose/Rate Route Frequency Ordered Stop   08/21/17 0845  ciprofloxacin (CIPRO) IVPB 400 mg     400 mg 200 mL/hr over 60 Minutes Intravenous  Once 08/21/17 0830 08/21/17 0910   08/17/17 1930  cefoTEtan (CEFOTAN) 2 g in sodium chloride 0.9 % 100 mL IVPB     2 g 200 mL/hr over 30 Minutes Intravenous Every 12 hours 08/17/17 1030 08/17/17 2257   08/17/17 0630  cefoTEtan (CEFOTAN) 2 g in sodium chloride 0.9 % 100 mL IVPB     2 g 200 mL/hr over 30 Minutes Intravenous On call to O.R. 08/17/17 7253 08/17/17 0810      Assessment/Plan: s/p Procedure(s): ENDOSCOPIC RETROGRADE CHOLANGIOPANCREATOGRAPHY (ERCP) SPHINCTEROTOMY REMOVAL OF STONES PANCREATIC STENT PLACEMENT Resume heparin later today per Dr. Perley Jain order Will give a dose of Coumadin tomorrow.prior to discharge.  Advance diet Plan for discharge tomorrow [place]  LOS: 0 days    Kathryne Eriksson. Dahlia Bailiff, MD, FACS [phone] [facility] Surgery 08/21/2017

## 2017-08-21 NOTE — Anesthesia Postprocedure Evaluation (Signed)
Anesthesia Post Note  Patient: Austin [name]  Procedure(s) Performed: ENDOSCOPIC RETROGRADE CHOLANGIOPANCREATOGRAPHY (ERCP) (N/A ) SPHINCTEROTOMY REMOVAL OF STONES PANCREATIC STENT PLACEMENT     Patient location during evaluation: PACU Anesthesia Type: General Level of consciousness: awake Pain management: pain level controlled Vital Signs Assessment: post-procedure vital signs reviewed and stable Respiratory status: spontaneous breathing Cardiovascular status: stable Anesthetic complications: no    Last Vitals:  Vitals:   08/21/17 1008 08/21/17 1041  BP: (!) 186/66 (!) 175/72  Pulse: 64 68  Resp: 13 16  Temp:  36.4 C  SpO2: 99% 97%    Last Pain:  Vitals:   08/21/17 1130  TempSrc:   PainSc: 2                  Laurrie Toppin
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
## 2017-08-21 NOTE — Progress Notes (Signed)
Austin [name] 7:44 AM  Subjective: Patient without any problems from previous ERCP attempt and no current GI complaints and case discussed yesterday with my partner Dr. Oletta Lamas and the surgical team as well and case discussed with both endoscopy nurses involved in his case discussed with anesthesia as well as his significant other and he has no other complaints Objective: Vital signs stable afebrile exam please see preassessment evaluation Intra-Op cholangiogram and previous ERCP note reviewed Labs reviewed Assessment: CBD stones on intraoperative cholangiogram  Plan: The risks and success rate of ERCP was rediscussed with the patient and his significant other and will proceed today with further workup and plans pending those findings  [facility]  Pager [phone] After 5PM or if no answer call [phone]

## 2017-08-22 ENCOUNTER — Telehealth: Payer: Self-pay

## 2017-08-22 ENCOUNTER — Non-Acute Institutional Stay (SKILLED_NURSING_FACILITY): Payer: Medicare Other | Admitting: Internal Medicine

## 2017-08-22 ENCOUNTER — Encounter: Payer: Self-pay | Admitting: Internal Medicine

## 2017-08-22 ENCOUNTER — Other Ambulatory Visit: Payer: Self-pay

## 2017-08-22 ENCOUNTER — Inpatient Hospital Stay
Admission: RE | Admit: 2017-08-22 | Discharge: 2017-09-07 | Disposition: A | Discharge status: Home / Self Care | Payer: Medicare Other | Source: Ambulatory Visit | Attending: Internal Medicine | Admitting: Internal Medicine

## 2017-08-22 ENCOUNTER — Telehealth: Payer: Self-pay | Admitting: Student

## 2017-08-22 DIAGNOSIS — Z743 Need for continuous supervision: Secondary | ICD-10-CM | POA: Diagnosis not present

## 2017-08-22 DIAGNOSIS — Z9049 Acquired absence of other specified parts of digestive tract: Secondary | ICD-10-CM

## 2017-08-22 DIAGNOSIS — N183 Chronic kidney disease, stage 3 unspecified: Secondary | ICD-10-CM

## 2017-08-22 DIAGNOSIS — Z967 Presence of other bone and tendon implants: Secondary | ICD-10-CM

## 2017-08-22 DIAGNOSIS — Z48815 Encounter for surgical aftercare following surgery on the digestive system: Secondary | ICD-10-CM | POA: Diagnosis not present

## 2017-08-22 DIAGNOSIS — M6281 Muscle weakness (generalized): Secondary | ICD-10-CM | POA: Diagnosis not present

## 2017-08-22 DIAGNOSIS — K8042 Calculus of bile duct with acute cholecystitis without obstruction: Secondary | ICD-10-CM

## 2017-08-22 DIAGNOSIS — R5381 Other malaise: Secondary | ICD-10-CM | POA: Diagnosis not present

## 2017-08-22 DIAGNOSIS — I480 Paroxysmal atrial fibrillation: Secondary | ICD-10-CM | POA: Diagnosis not present

## 2017-08-22 DIAGNOSIS — G8114 Spastic hemiplegia affecting left nondominant side: Secondary | ICD-10-CM | POA: Diagnosis not present

## 2017-08-22 DIAGNOSIS — Z86718 Personal history of other venous thrombosis and embolism: Secondary | ICD-10-CM

## 2017-08-22 DIAGNOSIS — I129 Hypertensive chronic kidney disease with stage 1 through stage 4 chronic kidney disease, or unspecified chronic kidney disease: Secondary | ICD-10-CM | POA: Diagnosis not present

## 2017-08-22 DIAGNOSIS — Z8781 Personal history of (healed) traumatic fracture: Secondary | ICD-10-CM | POA: Diagnosis not present

## 2017-08-22 DIAGNOSIS — Z9889 Other specified postprocedural states: Secondary | ICD-10-CM

## 2017-08-22 DIAGNOSIS — S72065D Nondisplaced articular fracture of head of left femur, subsequent encounter for closed fracture with routine healing: Secondary | ICD-10-CM | POA: Diagnosis not present

## 2017-08-22 DIAGNOSIS — R279 Unspecified lack of coordination: Secondary | ICD-10-CM | POA: Diagnosis not present

## 2017-08-22 DIAGNOSIS — Z9181 History of falling: Secondary | ICD-10-CM | POA: Diagnosis not present

## 2017-08-22 DIAGNOSIS — Z7901 Long term (current) use of anticoagulants: Secondary | ICD-10-CM | POA: Diagnosis not present

## 2017-08-22 DIAGNOSIS — Z978 Presence of other specified devices: Secondary | ICD-10-CM | POA: Diagnosis not present

## 2017-08-22 DIAGNOSIS — E785 Hyperlipidemia, unspecified: Secondary | ICD-10-CM | POA: Diagnosis not present

## 2017-08-22 LAB — COMPREHENSIVE METABOLIC PANEL
ALT: 238 U/L — ABNORMAL HIGH (ref 0–44)
AST: 489 U/L — ABNORMAL HIGH (ref 15–41)
Albumin: 2.7 g/dL — ABNORMAL LOW (ref 3.5–5.0)
Alkaline Phosphatase: 217 U/L — ABNORMAL HIGH (ref 38–126)
Anion gap: 7 (ref 5–15)
BUN: 17 mg/dL (ref 8–23)
CO2: 25 mmol/L (ref 22–32)
Calcium: 8.8 mg/dL — ABNORMAL LOW (ref 8.9–10.3)
Chloride: 106 mmol/L (ref 98–111)
Creatinine, Ser: 1.54 mg/dL — ABNORMAL HIGH (ref 0.61–1.24)
GFR calc Af Amer: 47 mL/min — ABNORMAL LOW (ref >60)
GFR calc non Af Amer: 40 mL/min — ABNORMAL LOW (ref >60)
Glucose, Bld: 137 mg/dL — ABNORMAL HIGH (ref 70–99)
Potassium: 4.2 mmol/L (ref 3.5–5.1)
Sodium: 138 mmol/L (ref 135–145)
Total Bilirubin: 0.9 mg/dL (ref 0.3–1.2)
Total Protein: 6.1 g/dL — ABNORMAL LOW (ref 6.5–8.1)

## 2017-08-22 LAB — CBC WITH DIFFERENTIAL/PLATELET
Abs Immature Granulocytes: 0.1 10*3/uL (ref 0.0–0.1)
Basophils Absolute: 0 10*3/uL (ref 0.0–0.1)
Basophils Relative: 0 %
Eosinophils Absolute: 0.2 10*3/uL (ref 0.0–0.7)
Eosinophils Relative: 2 %
HCT: 34 % — ABNORMAL LOW (ref 39.0–52.0)
Hemoglobin: 11.1 g/dL — ABNORMAL LOW (ref 13.0–17.0)
Immature Granulocytes: 1 %
Lymphocytes Relative: 6 %
Lymphs Abs: 0.6 10*3/uL — ABNORMAL LOW (ref 0.7–4.0)
MCH: 30.2 pg (ref 26.0–34.0)
MCHC: 32.6 g/dL (ref 30.0–36.0)
MCV: 92.6 fL (ref 78.0–100.0)
Monocytes Absolute: 1.1 10*3/uL — ABNORMAL HIGH (ref 0.1–1.0)
Monocytes Relative: 10 %
Neutro Abs: 8.7 10*3/uL — ABNORMAL HIGH (ref 1.7–7.7)
Neutrophils Relative %: 81 %
Platelets: 141 10*3/uL — ABNORMAL LOW (ref 150–400)
RBC: 3.67 MIL/uL — ABNORMAL LOW (ref 4.22–5.81)
RDW: 14.7 % (ref 11.5–15.5)
WBC: 10.8 10*3/uL — ABNORMAL HIGH (ref 4.0–10.5)

## 2017-08-22 LAB — PROTIME-INR
INR: 1.19
Prothrombin Time: 15 seconds (ref 11.4–15.2)

## 2017-08-22 LAB — HEPARIN LEVEL (UNFRACTIONATED)
Heparin Unfractionated: 0.31 IU/mL (ref 0.30–0.70)
Heparin Unfractionated: 0.34 IU/mL (ref 0.30–0.70)

## 2017-08-22 MED ORDER — OXYCODONE HCL 5 MG PO TABS: 5.0000 mg | ORAL_TABLET | ORAL | 0 refills | Status: DC | PRN

## 2017-08-22 MED ORDER — PANTOPRAZOLE SODIUM 40 MG PO TBEC: 40.0000 mg | DELAYED_RELEASE_TABLET | Freq: Every day | ORAL | 0 refills | Status: DC

## 2017-08-22 MED ORDER — WARFARIN SODIUM 5 MG PO TABS
10.0000 mg | ORAL_TABLET | Freq: Once | ORAL | Status: AC
  Administered 2017-08-22: 10 mg via ORAL
  Filled 2017-08-22: qty 2

## 2017-08-22 MED ORDER — WARFARIN - PHYSICIAN DOSING INPATIENT: Freq: Every day | Status: DC

## 2017-08-22 MED ORDER — WARFARIN SODIUM 5 MG PO TABS: 7.5000 mg | ORAL_TABLET | Freq: Every day | ORAL | 0 refills | Status: DC

## 2017-08-22 NOTE — Social Work (Addendum)
CSW confirmed pt from [facility], he is set to return.  When FL2 complete CSW will send information through hub and arrange for discharge.   10:15am- CSW spoke with pt at bedside, pt wife will be by at 8. Pt states he usually take [place] back to [facility], will speak with pt wife about this arrangement.  Alexander Mt, [place] Work [phone]

## 2017-08-22 NOTE — Clinical Social Work Note (Signed)
Clinical Social Work Assessment  Patient Details  Name: Austin [name] MRN: [id number] Date of Birth: Apr 29, 1932  Date of referral:  08/22/17               Reason for consult:  Facility Placement, Discharge Planning                Permission sought to share information with:  Facility Sport and exercise psychologist, Family Supports Permission granted to share information::  Yes, Verbal Permission Granted  Name::     Austin [name]  Agency::  [facility]  Relationship::  wife  Contact Information:     Housing/Transportation Living arrangements for the past 2 months:  [place] of Information:  Patient, Spouse Patient Interpreter Needed:  None Criminal Activity/Legal Involvement Pertinent to Current Situation/Hospitalization:  No - Comment as needed Significant Relationships:  Warehouse manager, Spouse Lives with:  Facility Resident Do you feel safe going back to the place where you live?  Yes Need for family participation in patient care:  Yes (Comment)  Care giving concerns: Pt las left sided paralysis, requires assistance with ADLs and IADLs. [name] from [facility], upon chart review pt has also been at [facility]. Pt family requests return to [facility].    Social Worker assessment / plan:  CSW met with pt at bedside, pt states he is from [facility], has been there for rehab, states no complaints with [facility]. Pt states his wife would be back by at 11:00am. CSW stated she would follow up to discuss discharge with pt wife.   CSW again visited room after 12:00. Pt wife states that she understands pt has been discharged is good with return to [facility]. CSW discussed discharge via PTAR. Pt wife requests pt receive lunch here at hospital before discharge because [facility] Nursing lunch has already been served. PTAR requested for 3:00pm, pt script behind PTAR papers on shadow chart.   Employment status:  Retired Radiation protection practitioner:  Medicare PT Recommendations:  Not assessed at this time [place] / Referral to community resources:  [place]  Patient/Family's Response to care:  Pt and pt wife understanding of discharge, gave consent for CSW to facilitate referral and discharge back to [facility].   Patient/Family's Understanding of and Emotional Response to Diagnosis, Current Treatment, and Prognosis:  Pt and pt wife state understanding of diagnosis, current treatment and prognosis. Pt and pt wife express understanding of pts physical limitations and SNF process. No further concern or questions voiced.   Emotional Assessment Appearance:  Appears stated age Attitude/Demeanor/Rapport:  Engaged, Gracious Affect (typically observed):  Accepting, Adaptable, Appropriate, Pleasant Orientation:  Oriented to Self, Oriented to Place, Oriented to  Time, Oriented to Situation Alcohol / Substance use:  Not Applicable Psych involvement (Current and /or in the community):  No (Comment)  Discharge Needs  Concerns to be addressed:  Care Coordination Readmission within the last 30 days:  No Current discharge risk:  Dependent with Mobility, Physical Impairment Barriers to Discharge:  Barriers Resolved   [place] 08/22/2017, 12:53 PM

## 2017-08-22 NOTE — Discharge Summary (Addendum)
Physician Discharge Summary  Patient ID: Austin [name] MRN: [id number] DOB/AGE: Jun 29, 1932 84 y.o.  Admit date: 08/17/2017 Discharge date: 08/22/2017  Admission Diagnoses:  Discharge Diagnoses:   Active Problems:   Choledocholithiasis with acute cholecystitis Abnormal LFT's status post ERCP  Discharged Condition: good  Hospital Course: Patient admitted after laparoscopic cholecystectomy because the intraoperative cholangiogram demonstrated an obstructing distal common bile ductal stone.  The initial ERCP performe on Saturday was unsuccessful at retrieving the stone, therefore he was set up for a repeat ERCP on Tuesday which was successful at removing the stone.  The patient had an increase in his liver enzymes post procedure/ERCP which is very likely secondary to the manipulation of the common bile duct.  Clinically he is without abdominal pain, he has no fevers, and his WBC is close to normal.  Repeat labs in 24 to 48 hours should show improvement.  His postoperative pain has been minimal and he has not require a lot of pain medication.  He was given heparin between procedures, and prior to discharge he was given a 10 mg dose of Coumadin.  He should resume his 7.5 mg dose daily once he gets back to his facility.  He will be discharged to the [facility] today.  Consults: GI  Significant Diagnostic Studies: labs: cbc/cmet and radiology: cholangiogram and ERCP  Treatments: IV hydration, antibiotics: Cipro and Cefotetan, anticoagulation: heparin and warfarin and surgery: Lap cholecystectomy  Discharge Exam: Blood pressure 123/64, pulse 82, temperature 98.2 F (36.8 C), temperature source Oral, resp. rate 17, height 5' 6.5" (1.689 m), weight 81.6 kg (180 lb), SpO2 97 %. General appearance: alert, cooperative and no distress Resp: clear to auscultation bilaterally GI: soft, non-tender; bowel sounds normal; no masses,  no organomegaly Neurologic: Mental status: Alert, oriented, thought  content appropriate Motor: Weakness on his left side from previous stroke Left sided weakness  Disposition: Discharge disposition: 03-Skilled Nursing Facility       Discharge Instructions    Call MD for:  difficulty breathing, headache or visual disturbances   Complete by:  As directed    Call MD for:  extreme fatigue   Complete by:  As directed    Call MD for:  hives   Complete by:  As directed    Call MD for:  persistant dizziness or light-headedness   Complete by:  As directed    Call MD for:  persistant nausea and vomiting   Complete by:  As directed    Call MD for:  redness, tenderness, or signs of infection (pain, swelling, redness, odor or green/yellow discharge around incision site)   Complete by:  As directed    Call MD for:  severe uncontrolled pain   Complete by:  As directed    Call MD for:  temperature >100.4   Complete by:  As directed    Diet - low sodium heart healthy   Complete by:  As directed    Increase activity slowly   Complete by:  As directed    Leave dressing on - Keep it clean, dry, and intact until clinic visit   Complete by:  As directed      Allergies as of 08/22/2017   No Known Allergies     Medication List    TAKE these medications   acetaminophen 325 MG tablet Commonly known as:  TYLENOL Take 650 mg by mouth every 8 (eight) hours as needed for moderate pain.   furosemide 20 MG tablet Commonly known as:  LASIX  Take 20 mg by mouth daily.   gabapentin 100 MG capsule Commonly known as:  NEURONTIN Take 100-300 mg by mouth See admin instructions. Take 163m by mouth twice daily and 3028mat bedtime   omeprazole 40 MG capsule Commonly known as:  PRILOSEC Take 40 mg by mouth daily.   oxyCODONE 5 MG immediate release tablet Commonly known as:  Oxy IR/ROXICODONE Take 1 tablet (5 mg total) by mouth every 4 (four) hours as needed for moderate pain.   pantoprazole 40 MG tablet Commonly known as:  PROTONIX Take 1 tablet (40 mg total) by  mouth daily.   polyethylene glycol packet Commonly known as:  MIRALAX / GLYCOLAX Take 17 g by mouth daily.   rosuvastatin 10 MG tablet Commonly known as:  CRESTOR Take 10 mg by mouth daily.   senna 8.6 MG Tabs tablet Commonly known as:  SENOKOT Take 1 tablet by mouth 2 (two) times daily.   warfarin 5 MG tablet Commonly known as:  COUMADIN Take as directed. If you are unsure how to take this medication, talk to your nurse or doctor. Original instructions:  Take 1.5 tablets (7.5 mg total) by mouth daily at 6 PM. Do not take next dose until tomorrow, August 23, 2017      Follow-up Information    WyJudeth HornMD. Schedule an appointment as soon as possible for a visit in 2 week(s).   Specialty:  General Surgery Contact information: [phone]           Signed: JaNazareth Kirk/31/2019, 7:46 AM

## 2017-08-22 NOTE — Telephone Encounter (Signed)
Patient is s/p IVC filter placement in 2015 for breakthrough DVT while on lovenox.  His filter has remained in place without known complication, however given duration since placement consideration for filter removal is appropriate.  The patient has not had further DVTs and has resumed anticoagulation.  Discussed case with Dr. Earleen Newport and received input from patient's PCP Cathlean Cower, MD.  Dr. Jenny Reichmann believes the filter should be permanent. No scheduled follow-up with Interventional Radiology is needed at this time.   Brynda Greathouse, MS RD PA-C 2:07 PM

## 2017-08-22 NOTE — Telephone Encounter (Signed)
She has been informed

## 2017-08-22 NOTE — Telephone Encounter (Signed)
Unfortunately, I would advise to keep the filter in place, thanks

## 2017-08-22 NOTE — Progress Notes (Signed)
[facility] PROGRESS NOTE Subjective Patient without any problems after ERCP and sphincterotomy yesterday  Objective: Vital signs in last 24 hours: Temp:  [98.2 F (36.8 C)-98.9 F (37.2 C)] 98.2 F (36.8 C) (07/31 0611) Pulse Rate:  [82-96] 82 (07/31 0611) Resp:  [17] 17 (07/31 0611) BP: (123-156)/(64-83) 123/64 (07/31 0611) SpO2:  [97 %] 97 % (07/31 0611) Last BM Date: 08/20/17  Intake/Output from previous day: 07/30 0701 - 07/31 0700 In: 1334.2 [P.O.:660; I.V.:674.2] Out: 2650 [Urine:2650] Intake/Output this shift: Total I/O In: -  Out: 500 [Urine:500]  PE: General--sitting in chair in no distress  Abdomen--soft and nontender  Lab Results: Recent Labs    08/20/17 1335 08/21/17 0435 08/22/17 0004  WBC 10.0 7.7 10.8*  HGB 10.7* 10.9* 11.1*  HCT 34.1* 34.8* 34.0*  PLT 157 145* 141*   BMET Recent Labs    08/20/17 1335 08/22/17 0004  NA 139 138  K 3.9 4.2  CL 109 106  CO2 26 25  CREATININE 1.51* 1.54*   LFT Recent Labs    08/20/17 1335 08/22/17 0004  PROT 6.2* 6.1*  AST 23 489*  ALT 23 238*  ALKPHOS 101 217*  BILITOT 0.5 0.9   PT/INR Recent Labs    08/20/17 1335 08/21/17 0435 08/22/17 0004  LABPROT 16.9* 15.2 15.0  INR 1.38 1.21 1.19   PANCREAS No results for input(s): LIPASE in the last 72 hours.       Studies/Results: Dg Ercp Biliary & Pancreatic Ducts  Result Date: 08/21/2017 CLINICAL DATA:  84 year old male with a history of choledocholithiasis EXAM: ERCP TECHNIQUE: Multiple spot images obtained with the fluoroscopic device and submitted for interpretation post-procedure. FLUOROSCOPY TIME:  Fluoroscopy Time:  8 minutes 53 seconds COMPARISON:  08/17/2017 FINDINGS: Single limited intraoperative fluoroscopic spot images during ERCP. Image demonstrates endoscope projecting over the upper abdomen with partial opacification of the extrahepatic bile ducts and a safety wire in place. Partially visualized IVC filter IMPRESSION:  Limited images during ERCP, demonstrates partial opacification of the extra hepatic biliary system. Please refer to the dictated operative report for full details of intraoperative findings and procedure. Electronically Signed   By: Corrie Mckusick D.O.   On: 08/21/2017 09:56    Medications: I have reviewed the patient's current medications.  Assessment:   1 CBD stone.. Remove yesterday by Dr. Watt Climes.  LFTs elevated today but patient asymptomatic probably due to swelling   Plan: Patient to be discharged.  Please call and let us know if we can be of any further help.   XAVI TOMASIK 08/22/2017, 3:33 PM  This note was created using voice recognition software. Minor errors may Have occurred unintentionally.  Pager: [phone] If no answer or after hours call [phone]
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
## 2017-08-22 NOTE — Discharge Instructions (Addendum)
Laparoscopic Cholecystectomy, Care After This sheet gives you information about how to care for yourself after your procedure. Your doctor may also give you more specific instructions. If you have problems or questions, contact your doctor. Follow these instructions at home: Care for cuts from surgery (incisions)   Follow instructions from your doctor about how to take care of your cuts from surgery. Make sure you: ? Wash your hands with soap and water before you change your bandage (dressing). If you cannot use soap and water, use hand sanitizer. ? Change your bandage as told by your doctor. ? Leave stitches (sutures), skin glue, or skin tape (adhesive) strips in place. They may need to stay in place for 2 weeks or longer. If tape strips get loose and curl up, you may trim the loose edges. Do not remove tape strips completely unless your doctor says it is okay.  Do not take baths, swim, or use a hot tub until your doctor says it is okay. Ask your doctor if you can take showers. You may only be allowed to take sponge baths for bathing.  Check your surgical cut area every day for signs of infection. Check for: ? More redness, swelling, or pain. ? More fluid or blood. ? Warmth. ? Pus or a bad smell. Activity  Do not drive or use heavy machinery while taking prescription pain medicine.  Do not lift anything that is heavier than 10 lb (4.5 kg) until your doctor says it is okay.  Do not play contact sports until your doctor says it is okay.  Do not drive for 24 hours if you were given a medicine to help you relax (sedative).  Rest as needed. Do not return to work or school until your doctor says it is okay. General instructions  Take over-the-counter and prescription medicines only as told by your doctor.  To prevent or treat constipation while you are taking prescription pain medicine, your doctor may recommend that you: ? Drink enough fluid to keep your pee (urine) clear or pale  yellow. ? Take over-the-counter or prescription medicines. ? Eat foods that are high in fiber, such as fresh fruits and vegetables, whole grains, and beans. ? Limit foods that are high in fat and processed sugars, such as fried and sweet foods. ? Leave dressing in place until seen in clinic Contact a doctor if:  You develop a rash.  You have more redness, swelling, or pain around your surgical cuts.  You have more fluid or blood coming from your surgical cuts.  Your surgical cuts feel warm to the touch.  You have pus or a bad smell coming from your surgical cuts.  You have a fever.  One or more of your surgical cuts breaks open. Get help right away if:  You have trouble breathing.  You have chest pain.  You have pain that is getting worse in your shoulders.  You faint or feel dizzy when you stand.  You have very bad pain in your belly (abdomen).  You are sick to your stomach (nauseous) for more than one day.  You have throwing up (vomiting) that lasts for more than one day.  You have leg pain. This information is not intended to replace advice given to you by your health care provider. Make sure you discuss any questions you have with your health care provider.  Kathryne Eriksson. Dahlia Bailiff, MD, FACS [phone] Surgery   Endoscopic Retrograde Cholangiopancreatogram, Care After This sheet gives you  information about how to care for yourself after your procedure. Your health care provider may also give you more specific instructions. If you have problems or questions, contact your health care provider. What can I expect after the procedure? After the procedure, it is common to have:  Soreness in your throat.  Nausea.  Bloating.  Dizziness.  Tiredness (fatigue).  Follow these instructions at home:  Take over-the-counter and prescription medicines only as told by your health care provider.  Do not drive for 24 hours  if you were given a medicine to help you relax (sedative) during your procedure. Have someone stay with you for 24 hours after the procedure.  Return to your normal activities as told by your health care provider. Ask your health care provider what activities are safe for you.  Return to eating what you normally do as soon as you feel well enough or as told by your health care provider.  Keep all follow-up visits as told by your health care provider. This is important. Contact a health care provider if:  You have pain in your abdomen that does not get better with medicine.  You develop signs of infection, such as: ? Chills. ? Feeling unwell. Get help right away if:  You have difficulty swallowing.  You have worsening pain in your throat, chest, or abdomen.  You vomit bright red blood or a substance that looks like coffee grounds.  You have bloody or very black stools.  You have a fever.  You have a sudden increase in swelling (bloating) in your abdomen. Summary  After the procedure, it is common to feel tired and to have some discomfort in your throat.  Contact your health care provider if you have signs of infection--such as chills or feeling unwell--or if you have pain that does not improve with medicine.  Get help right away if you have trouble swallowing, worsening pain, bloody or black vomit, bloody or black stools, a fever, or increased swelling in your abdomen.  Keep all follow-up visits as told by your health care provider. This is important. This information is not intended to replace advice given to you by your health care provider. Make sure you discuss any questions you have with your health care provider.   Kathryne Eriksson. Dahlia Bailiff, MD, [place] [phone] [facility]

## 2017-08-22 NOTE — Progress Notes (Signed)
ANTICOAGULATION CONSULT NOTE - Follow Up Consult  Pharmacy Consult for heparin Indication: h/o DVT and Afib  Labs: Recent Labs    08/20/17 1335 08/20/17 1745 08/21/17 0435 08/22/17 0004  HGB 10.7*  --  10.9* 11.1*  HCT 34.1*  --  34.8* 34.0*  PLT 157  --  145* 141*  LABPROT 16.9*  --  15.2 15.0  INR 1.38  --  1.21 1.19  HEPARINUNFRC  --  0.74* <0.10* 0.31  CREATININE 1.51*  --   --  1.54*    Assessment/Plan:  84yo male therapeutic on heparin after resume. Will continue gtt at current rate and confirm stable with additional level.   Wynona Neat, PharmD, BCPS  08/22/2017,1:18 AM

## 2017-08-22 NOTE — Social Work (Addendum)
Clinical Social Worker facilitated patient discharge including contacting patient family and facility to confirm patient discharge plans.  Clinical information faxed to facility and family agreeable with plan. Script in discharge envelope signed.     CSW arranged ambulance transport via [facility] to [facility].  RN to call [phone] with report  prior to discharge.  Clinical Social Worker will sign off for now as social work intervention is no longer needed. Please consult [place] again if new need arises.  Alexander Mt, [place] Social Worker [phone]
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
## 2017-08-22 NOTE — Clinical Social Work Placement (Signed)
   CLINICAL SOCIAL WORK [place] RN to call report to [phone]  Date:  08/22/2017  Patient Details  Name: Austin [name] MRN: [id number] Date of Birth: 10-24-1932  Clinical Social Work is seeking post-discharge placement for this patient at the [place] level of care (*CSW will initial, date and re-position this form in  chart as items are completed):      Patient/family provided with [place] Work Department's list of facilities offering this level of care within the geographic area requested by the patient (or if unable, by the patient's family).  Yes   Patient/family informed of their freedom to choose among providers that offer the needed level of care, that participate in Medicare, Medicaid or managed care program needed by the patient, have an available bed and are willing to accept the patient.      Patient/family informed of [place]'s ownership interest in [facility] and [facility], as well as of the fact that they are under no obligation to receive care at these facilities.  PASRR submitted to EDS on       PASRR number received on       Existing PASRR number confirmed on 08/22/17     FL2 transmitted to all facilities in geographic area requested by pt/family on 08/22/17     FL2 transmitted to all facilities within larger geographic area on       Patient informed that his/her managed care company has contracts with or will negotiate with certain facilities, including the following:        Yes   Patient/family informed of bed offers received.  Patient chooses bed at [facility]     Physician recommends and patient chooses bed at      Patient to be transferred to [facility] on 08/22/17.  Patient to be transferred to facility by [facility]     Patient family notified on 08/22/17 of transfer.  Name of family member notified:  wife, [name] at bedside     PHYSICIAN        Additional Comment:    _______________________________________________ Alexander Mt, [place] 08/22/2017, 12:54 PM

## 2017-08-22 NOTE — Consult Note (Signed)
   [facility] Inpatient Consult   08/22/2017  Austin [name] [birth year] [id number]    Austin [name] is active with [facility] [employer] program. He is followed by [facility] LCSW. Please see chart review tab then encounters for patient outreach details.   Chart reviewed. Noted Austin [name] is supposed to discharge back to [facility] SNF today.   Will make [place] LCSW aware.  Marthenia Rolling, MSN-Ed, RN,BSN [facility] Liaison [phone]

## 2017-08-22 NOTE — Progress Notes (Signed)
Report called to [facility] and given to [place], [place]. Patient transported by EMS.

## 2017-08-22 NOTE — Telephone Encounter (Signed)
Copied from [place] [phone]. Topic: General - Other >> Aug 21, 2017  3:41 PM Yvette Rack wrote: Reason for CRM: Myriam Jacobson with [facility] states pt had IVC filter placed in 2015. It was placed for breakthrough DVT while on Lovenox. Dr Earleen Newport wants Dr. Gwynn Burly opinion whether or not filter can be removed since patient is back on Coumadin. Cb# [phone]

## 2017-08-22 NOTE — Progress Notes (Signed)
This is an acute visit.  Level care skilled.  Facility is [facility].  Chief complaint-acute visit status post hospitalization for cholecystectomy and subsequent ERCP to retrieve remaining stone.  History of present illness.  Patient is a pleasant 84 year old male with a history of left-sided hemiparalysis with history of DVT in 2015 on chronic Coumadin and filter placement also a history of atrial fibrillation as well recent left hip fracture sustained after a fall that was surgically repaired  He was admitted hre for therapy with a continued colostomy drain with plans to eventually have a cholecystectomy  He subsequently was rehospitalized for this and underwent this apparently fairly uneventfully --it was a laparoscopic procedure.  The intraoperative  Cholangiogram did show an obstructing distal common bile ductal stone-initial ERCP on Saturday was unsuccessful however he had a repeat ERCP yesterday which was successful at removing the stone.  It was noted on today's labs liver function tests are elevated but this was thought secondary to the procedure manipulation of the common bile duct-he was thought to be asymptomatic with suggestion to repeat labs in 24-48 hours  Postop pain apparently was minimal he has been restarted on his Coumadin.  Currently he is resting in bed comfortably and does not really have any complaints he feels he is stable and thinks the procedure went extremely well.  He is not complaining of any abdominal pain he is afebrile vital signs are stable  Past Medical History:  Diagnosis Date  . ADENOCARCINOMA, PROSTATE, GLEASON GRADE 5 01/21/2009  . ALLERGIC RHINITIS 01/14/2007  . Bilateral inguinal hernia   . Cholecystitis   . Cholelithiasis   . CKD (chronic kidney disease) stage 3, GFR 30-59 ml/min (HCC) 04/30/2015  . COLONIC POLYPS, HX OF 01/14/2007  . ELEVATED PROSTATE SPECIFIC ANTIGEN 03/27/2008  . ESOPHAGITIS 01/14/2007  . GERD (gastroesophageal  reflux disease)   . HYPERLIPIDEMIA 01/14/2007  . HYPERTENSION 01/14/2007  . LIBIDO, DECREASED 01/15/2007  . PAF (paroxysmal atrial fibrillation) ([place])   . Paralysis ([place])    from [place] 02/2013        Past Surgical History:  Procedure Laterality Date  . ESOPHAGOGASTRODUODENOSCOPY N/A 08/15/2013   Procedure: ESOPHAGOGASTRODUODENOSCOPY (EGD);  Surgeon: Gwenyth Ober, MD;  [facility] ENDOSCOPY;  Service: General;  Laterality: N/A;  . HERNIA REPAIR     Bilateral Inguinal hernias  . HIP PINNING,CANNULATED Left 07/18/2017   Procedure: CANNULATED HIP PINNING;  Surgeon: Carole Civil, MD;  Location: [facility];  Service: Orthopedics;  Laterality: Left;  . HIP SURGERY     Left  . IR EXCHANGE BILIARY DRAIN  05/24/2017  . IR EXCHANGE BILIARY DRAIN  07/27/2017  . IR PERC CHOLECYSTOSTOMY  04/10/2017  . JOINT REPLACEMENT    . PEG PLACEMENT N/A 09/02/2013   Procedure: PERCUTANEOUS ENDOSCOPIC GASTROSTOMY (PEG) PLACEMENT;  Surgeon: Gwenyth Ober, MD;  Location: [place];  Service: General;  Laterality: N/A;  . PROSTATE CRYOABLATION      No Known Allergies    MEDICATIONS   acetaminophen 325 MG tablet Commonly known as:  TYLENOL Take 650 mg by mouth every 8 (eight) hours as needed for moderate pain.   furosemide 20 MG tablet Commonly known as:  LASIX Take 20 mg by mouth daily.   gabapentin 100 MG capsule Commonly known as:  NEURONTIN Take 100-300 mg by mouth See admin instructions. Take 100mg  by mouth twice daily and 300mg  at bedtime   omeprazole 40 MG capsule Commonly known as:  PRILOSEC Take 40 mg by mouth  daily.   oxyCODONE 5 MG immediate release tablet Commonly known as:  Oxy IR/ROXICODONE Take 1 tablet (5 mg total) by mouth every 4 (four) hours as needed for moderate pain.   pantoprazole 40 MG tablet Commonly known as:  PROTONIX Take 1 tablet (40 mg total) by mouth daily.   polyethylene glycol packet Commonly known as:  MIRALAX / GLYCOLAX Take  17 g by mouth daily.   rosuvastatin 10 MG tablet Commonly known as:  CRESTOR Take 10 mg by mouth daily.   senna 8.6 MG Tabs tablet Commonly known as:  SENOKOT Take 1 tablet by mouth 2 (two) times daily.   warfarin 5 MG tablet Commonly known as:  COUMADIN Take as directed. If you are unsure how to take this medication, talk to your nurse or doctor. Original instructions:  Take 1.5 tablets (7.5 mg total) by mouth daily at 6 PM. Do not take next dose until tomorrow, August 23, 2017       View of systems.  General is not complaining of fever chills.  Skin does not complain of rashes or itching he did have a rash before he was rehospitalized thought secondary to being in the heat and sun exposure but this resolved.  Laparoscopic scars are minimal without sign of infection.  Head ears eyes nose mouth and throat is not complaining of any sore throat or visual changes or difficulty swallowing he does have baseline mouth droop.  Respiratory is not complaining of shortness of breath or increased cough from baseline.  Cardiac does not complain of chest pain or significant lower extremity edema  GI does not complain of abdominal discomfort nausea vomiting diarrhea or constipation.  GU does not complain of dysuria.  Musculoskeletal says his pain is actually minimal not really complain of acute joint pain hip pain.  Neurologic does not complain of dizziness headache or syncope.  Psych continues to be in good spirits appears relieved that the procedure went so well  Physical exam.  Temp  is 98.2 pulse 84 respiration 17 blood pressure 125/72 O2 saturation is 96% on room air  In general this is a very pleasant elderly male in no distress resting comfortably in bed.  His skin is warm and dry-laparoscopic surgical scars abdomen appear to be benign without sign of infection.  Eyes visual acuity appears to be grossly intact sclera and conjunctive are clear.  Oropharynx is clear  mucous membranes moist he does have a mouth droop  Chest is clear to auscultation with shallow air entry there is no labored breathing  Heart is regular rate and rhythm without murmur gallop or rub he does not really have significant lower extremity edema pedal pulses are intact bilaterally.  Abdomen is somewhat obese soft nontender with positive bowel sounds scars appear benign as noted above.  Musculoskeletal does continue with left-sided hemiparalysis with some contracture of his left fingers- right  upper and lower extremity strength appears preserved   Neurologic as noted above with left-sided hemiparalysis and mouth droop--  Psych he is alert and oriented very pleasant and appropriate  Labs.  August 22, 2017.  WBC 10.8 hemoglobin 11.1 platelets 141  Sodium 138 potassium 4.2 BUN 17 creatinine 1.54- albumin 2.7- AST 489- ALT 238--alkaline phosphatase 217  INR 1.19   Assessment and plan.  History of cholecystitis status post cholecystectomy And subsequent gallstone removal status post ERCP yesterday.  Apparently tolerated procedure well- liver function tests are elevated but thought secondary to manipulation from the ERCP- will update a  CBC with differential and metabolic panel Friday-currently appears to be asymptomatic.  Regards to pain apparently this was well controlled he does have a oxycodone as needed.   #2 history of DVT back in 2015- continues on Coumadin- he still has a filter placed his primary care p rovider was notified about possible removal of this but PCP did recommend continuing it.  Will titrate up Coumadin dose accordingly update INR when labs are drawn on Friday  3 history of atrial fibrillation this appears rate controlled he is not on a rate limiting agent he is on Coumadin for anticoagulation.  4.  History of CVA with left-sided hemiparalysis- again continues on Coumadin he also is on a statin  #5 history of chronic kidney disease this appears  relatively stable with a creatinine at 1.5 on discharge again this will be updated later this week  #6 history of hip fracture with repair- he is followed by orthopedics and is weightbearing as tolerated  #7 history of left spastic hemiparesis-continues on Neurontin  #8 history of systolic CHF continues on Lasix need to monitor weight and clinical status  #9 history of anemia thought to be postop- chronic disease as well- this appears stable with a hemoglobin of 11.1 on discharge today   Again will update a CBC with differential and CMP on Friday to keep an eye on his liver function tests- white count as well as renal function- update INR since Coumadin has been restarted  [id number]

## 2017-08-22 NOTE — Telephone Encounter (Signed)
RX Fax for [facility]@ [phone]  

## 2017-08-23 ENCOUNTER — Non-Acute Institutional Stay (SKILLED_NURSING_FACILITY): Payer: Medicare Other | Admitting: Internal Medicine

## 2017-08-23 ENCOUNTER — Telehealth: Payer: Self-pay | Admitting: *Deleted

## 2017-08-23 ENCOUNTER — Encounter: Payer: Self-pay | Admitting: Internal Medicine

## 2017-08-23 ENCOUNTER — Ambulatory Visit: Payer: Self-pay | Admitting: *Deleted

## 2017-08-23 DIAGNOSIS — Z8781 Personal history of (healed) traumatic fracture: Secondary | ICD-10-CM

## 2017-08-23 DIAGNOSIS — N183 Chronic kidney disease, stage 3 unspecified: Secondary | ICD-10-CM

## 2017-08-23 DIAGNOSIS — Z9049 Acquired absence of other specified parts of digestive tract: Secondary | ICD-10-CM | POA: Diagnosis not present

## 2017-08-23 DIAGNOSIS — G8114 Spastic hemiplegia affecting left nondominant side: Secondary | ICD-10-CM

## 2017-08-23 DIAGNOSIS — Z967 Presence of other bone and tendon implants: Secondary | ICD-10-CM

## 2017-08-23 DIAGNOSIS — Z9889 Other specified postprocedural states: Secondary | ICD-10-CM

## 2017-08-23 NOTE — Telephone Encounter (Signed)
Pt was on TCM report admitted 08/17/17 after laparoscopic cholecystectomy because the intraoperative cholangiogram demonstrated an obstructing distal common bile ductal stone.  The initial ERCP performe on Saturday was unsuccessful at retrieving the stone, therefore he was set up for a repeat ERCP on Tuesday which was successful at removing the stone.Pt was D/C 08/22/17 SNF [facility]), and will f/u w/surgeon in 2 weeks.Marland KitchenJohny [name]
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
## 2017-08-24 ENCOUNTER — Ambulatory Visit: Payer: Medicare Other | Admitting: Cardiology

## 2017-08-24 ENCOUNTER — Encounter (HOSPITAL_COMMUNITY)
Admission: RE | Admit: 2017-08-24 | Discharge: 2017-08-24 | Disposition: A | Discharge status: Home / Self Care | Payer: Medicare Other | Source: Skilled Nursing Facility | Attending: Internal Medicine | Admitting: Internal Medicine

## 2017-08-24 ENCOUNTER — Ambulatory Visit: Payer: Self-pay | Admitting: *Deleted

## 2017-08-24 DIAGNOSIS — S72065D Nondisplaced articular fracture of head of left femur, subsequent encounter for closed fracture with routine healing: Secondary | ICD-10-CM | POA: Insufficient documentation

## 2017-08-24 DIAGNOSIS — Z48815 Encounter for surgical aftercare following surgery on the digestive system: Secondary | ICD-10-CM | POA: Insufficient documentation

## 2017-08-24 LAB — CBC WITH DIFFERENTIAL/PLATELET
Basophils Absolute: 0 10*3/uL (ref 0.0–0.1)
Basophils Relative: 0 %
Eosinophils Absolute: 0.7 10*3/uL (ref 0.0–0.7)
Eosinophils Relative: 8 %
HCT: 33.3 % — ABNORMAL LOW (ref 39.0–52.0)
Hemoglobin: 10.8 g/dL — ABNORMAL LOW (ref 13.0–17.0)
Lymphocytes Relative: 18 %
Lymphs Abs: 1.6 10*3/uL (ref 0.7–4.0)
MCH: 30.7 pg (ref 26.0–34.0)
MCHC: 32.4 g/dL (ref 30.0–36.0)
MCV: 94.6 fL (ref 78.0–100.0)
Monocytes Absolute: 0.9 10*3/uL (ref 0.1–1.0)
Monocytes Relative: 10 %
Neutro Abs: 5.8 10*3/uL (ref 1.7–7.7)
Neutrophils Relative %: 64 %
Platelets: 143 10*3/uL — ABNORMAL LOW (ref 150–400)
RBC: 3.52 MIL/uL — ABNORMAL LOW (ref 4.22–5.81)
RDW: 15.5 % (ref 11.5–15.5)
WBC: 9 10*3/uL (ref 4.0–10.5)

## 2017-08-24 LAB — COMPREHENSIVE METABOLIC PANEL WITH GFR
ALT: 101 U/L — ABNORMAL HIGH (ref 0–44)
AST: 33 U/L (ref 15–41)
Albumin: 2.9 g/dL — ABNORMAL LOW (ref 3.5–5.0)
Alkaline Phosphatase: 162 U/L — ABNORMAL HIGH (ref 38–126)
Anion gap: 5 (ref 5–15)
BUN: 22 mg/dL (ref 8–23)
CO2: 28 mmol/L (ref 22–32)
Calcium: 8.9 mg/dL (ref 8.9–10.3)
Chloride: 106 mmol/L (ref 98–111)
Creatinine, Ser: 1.56 mg/dL — ABNORMAL HIGH (ref 0.61–1.24)
GFR calc Af Amer: 46 mL/min — ABNORMAL LOW
GFR calc non Af Amer: 40 mL/min — ABNORMAL LOW
Glucose, Bld: 94 mg/dL (ref 70–99)
Potassium: 3.9 mmol/L (ref 3.5–5.1)
Sodium: 139 mmol/L (ref 135–145)
Total Bilirubin: 0.6 mg/dL (ref 0.3–1.2)
Total Protein: 6.4 g/dL — ABNORMAL LOW (ref 6.5–8.1)

## 2017-08-24 LAB — PROTIME-INR
INR: 1.26
Prothrombin Time: 15.7 seconds — ABNORMAL HIGH (ref 11.4–15.2)

## 2017-08-26 ENCOUNTER — Encounter (HOSPITAL_COMMUNITY)
Admission: AD | Admit: 2017-08-26 | Discharge: 2017-08-26 | Disposition: A | Discharge status: Home / Self Care | Payer: Medicare Other | Source: Skilled Nursing Facility

## 2017-08-26 LAB — PROTIME-INR
INR: 1.42
Prothrombin Time: 17.2 seconds — ABNORMAL HIGH (ref 11.4–15.2)

## 2017-08-27 ENCOUNTER — Encounter (HOSPITAL_COMMUNITY)
Admission: RE | Admit: 2017-08-27 | Discharge: 2017-08-27 | Disposition: A | Discharge status: Home / Self Care | Payer: Medicare Other | Source: Skilled Nursing Facility | Attending: Internal Medicine | Admitting: Internal Medicine

## 2017-08-27 DIAGNOSIS — S72065D Nondisplaced articular fracture of head of left femur, subsequent encounter for closed fracture with routine healing: Secondary | ICD-10-CM | POA: Insufficient documentation

## 2017-08-27 DIAGNOSIS — Z48815 Encounter for surgical aftercare following surgery on the digestive system: Secondary | ICD-10-CM | POA: Insufficient documentation

## 2017-08-28 ENCOUNTER — Other Ambulatory Visit: Payer: Self-pay | Admitting: *Deleted

## 2017-08-28 ENCOUNTER — Other Ambulatory Visit (HOSPITAL_COMMUNITY)
Admission: RE | Admit: 2017-08-28 | Discharge: 2017-08-28 | Disposition: A | Discharge status: Home / Self Care | Payer: Medicare Other | Source: Skilled Nursing Facility | Attending: Internal Medicine | Admitting: Internal Medicine

## 2017-08-28 DIAGNOSIS — Z7901 Long term (current) use of anticoagulants: Secondary | ICD-10-CM | POA: Insufficient documentation

## 2017-08-28 DIAGNOSIS — I129 Hypertensive chronic kidney disease with stage 1 through stage 4 chronic kidney disease, or unspecified chronic kidney disease: Secondary | ICD-10-CM | POA: Insufficient documentation

## 2017-08-28 DIAGNOSIS — I10 Essential (primary) hypertension: Secondary | ICD-10-CM | POA: Insufficient documentation

## 2017-08-28 LAB — COMPREHENSIVE METABOLIC PANEL
ALT: 40 U/L (ref 0–44)
AST: 21 U/L (ref 15–41)
Albumin: 3 g/dL — ABNORMAL LOW (ref 3.5–5.0)
Alkaline Phosphatase: 129 U/L — ABNORMAL HIGH (ref 38–126)
Anion gap: 6 (ref 5–15)
BUN: 27 mg/dL — ABNORMAL HIGH (ref 8–23)
CO2: 28 mmol/L (ref 22–32)
Calcium: 9 mg/dL (ref 8.9–10.3)
Chloride: 105 mmol/L (ref 98–111)
Creatinine, Ser: 1.61 mg/dL — ABNORMAL HIGH (ref 0.61–1.24)
GFR calc Af Amer: 44 mL/min — ABNORMAL LOW (ref >60)
GFR calc non Af Amer: 38 mL/min — ABNORMAL LOW (ref >60)
Glucose, Bld: 96 mg/dL (ref 70–99)
Potassium: 3.8 mmol/L (ref 3.5–5.1)
Sodium: 139 mmol/L (ref 135–145)
Total Bilirubin: 0.6 mg/dL (ref 0.3–1.2)
Total Protein: 6.6 g/dL (ref 6.5–8.1)

## 2017-08-28 LAB — CBC WITH DIFFERENTIAL/PLATELET
Basophils Absolute: 0 10*3/uL (ref 0.0–0.1)
Basophils Relative: 0 %
Eosinophils Absolute: 0.4 10*3/uL (ref 0.0–0.7)
Eosinophils Relative: 4 %
HCT: 32.8 % — ABNORMAL LOW (ref 39.0–52.0)
Hemoglobin: 10.5 g/dL — ABNORMAL LOW (ref 13.0–17.0)
Lymphocytes Relative: 19 %
Lymphs Abs: 1.6 10*3/uL (ref 0.7–4.0)
MCH: 30.2 pg (ref 26.0–34.0)
MCHC: 32 g/dL (ref 30.0–36.0)
MCV: 94.3 fL (ref 78.0–100.0)
Monocytes Absolute: 0.8 10*3/uL (ref 0.1–1.0)
Monocytes Relative: 10 %
Neutro Abs: 5.4 10*3/uL (ref 1.7–7.7)
Neutrophils Relative %: 67 %
Platelets: 191 10*3/uL (ref 150–400)
RBC: 3.48 MIL/uL — ABNORMAL LOW (ref 4.22–5.81)
RDW: 15.2 % (ref 11.5–15.5)
WBC: 8.1 10*3/uL (ref 4.0–10.5)

## 2017-08-28 LAB — PROTIME-INR
INR: 1.54
Prothrombin Time: 18.4 seconds — ABNORMAL HIGH (ref 11.4–15.2)

## 2017-08-28 NOTE — Patient Outreach (Signed)
[facility]) Care Management  08/28/2017  Austin [name] 11/26/1932 [id number]   CSW met with patient & his wife, Austin [name] at bedside at [facility] room #: 119. Patient returned to SNF after having ERCP to remove stone. Patient has an ortho follow-up appointment with Dr. Arther Abbott on 8/26 and plans to discharge back home with caregivers. CSW will follow-up with patient in 2 weeks to confirm discharge plans and make referral to [place] for transition of care.    Raynaldo Opitz, LCSW [facility]  Clinical Social Worker cell #: [phone]

## 2017-08-28 NOTE — Patient Outreach (Signed)
[facility]) Care Management  08/28/2017  Austin [name] July 15, 1932 [id number]   Onsite IDT meeting. Patient is close to using up his 100 SNF days next week. Plan is for patient to go home upon exhausting his 100 days.   Plan to collaborate with [facility] care management and UM team regarding discharge plans.  Royetta Crochet. [name], RN, BSN, [place] [phone]) Business Cell  [phone]) Toll Free Office

## 2017-09-03 ENCOUNTER — Non-Acute Institutional Stay (SKILLED_NURSING_FACILITY): Payer: Medicare Other | Admitting: Internal Medicine

## 2017-09-03 ENCOUNTER — Encounter: Payer: Self-pay | Admitting: Internal Medicine

## 2017-09-03 DIAGNOSIS — Z8781 Personal history of (healed) traumatic fracture: Secondary | ICD-10-CM

## 2017-09-03 DIAGNOSIS — M79605 Pain in left leg: Secondary | ICD-10-CM

## 2017-09-03 DIAGNOSIS — Z967 Presence of other bone and tendon implants: Secondary | ICD-10-CM

## 2017-09-03 DIAGNOSIS — N183 Chronic kidney disease, stage 3 unspecified: Secondary | ICD-10-CM

## 2017-09-03 DIAGNOSIS — I480 Paroxysmal atrial fibrillation: Secondary | ICD-10-CM | POA: Diagnosis not present

## 2017-09-03 DIAGNOSIS — D649 Anemia, unspecified: Secondary | ICD-10-CM

## 2017-09-03 DIAGNOSIS — G8114 Spastic hemiplegia affecting left nondominant side: Secondary | ICD-10-CM

## 2017-09-03 DIAGNOSIS — Z9889 Other specified postprocedural states: Secondary | ICD-10-CM
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
## 2017-09-04 ENCOUNTER — Encounter: Payer: Self-pay | Admitting: Internal Medicine

## 2017-09-04 ENCOUNTER — Other Ambulatory Visit: Payer: Self-pay | Admitting: *Deleted

## 2017-09-04 ENCOUNTER — Encounter (HOSPITAL_COMMUNITY)
Admission: RE | Admit: 2017-09-04 | Discharge: 2017-09-04 | Disposition: A | Discharge status: Home / Self Care | Payer: Medicare Other | Source: Skilled Nursing Facility | Attending: Internal Medicine | Admitting: Internal Medicine

## 2017-09-04 DIAGNOSIS — Z48815 Encounter for surgical aftercare following surgery on the digestive system: Secondary | ICD-10-CM | POA: Insufficient documentation

## 2017-09-04 DIAGNOSIS — S72065D Nondisplaced articular fracture of head of left femur, subsequent encounter for closed fracture with routine healing: Secondary | ICD-10-CM | POA: Insufficient documentation

## 2017-09-04 DIAGNOSIS — I4891 Unspecified atrial fibrillation: Secondary | ICD-10-CM | POA: Insufficient documentation

## 2017-09-04 LAB — COMPREHENSIVE METABOLIC PANEL
ALT: 19 U/L (ref 0–44)
AST: 18 U/L (ref 15–41)
Albumin: 3.2 g/dL — ABNORMAL LOW (ref 3.5–5.0)
Alkaline Phosphatase: 122 U/L (ref 38–126)
Anion gap: 5 (ref 5–15)
BUN: 24 mg/dL — ABNORMAL HIGH (ref 8–23)
CO2: 26 mmol/L (ref 22–32)
Calcium: 9 mg/dL (ref 8.9–10.3)
Chloride: 111 mmol/L (ref 98–111)
Creatinine, Ser: 1.56 mg/dL — ABNORMAL HIGH (ref 0.61–1.24)
GFR calc Af Amer: 46 mL/min — ABNORMAL LOW (ref >60)
GFR calc non Af Amer: 40 mL/min — ABNORMAL LOW (ref >60)
Glucose, Bld: 87 mg/dL (ref 70–99)
Potassium: 3.8 mmol/L (ref 3.5–5.1)
Sodium: 142 mmol/L (ref 135–145)
Total Bilirubin: 0.6 mg/dL (ref 0.3–1.2)
Total Protein: 6.9 g/dL (ref 6.5–8.1)

## 2017-09-04 LAB — CBC WITH DIFFERENTIAL/PLATELET
Basophils Absolute: 0 10*3/uL (ref 0.0–0.1)
Basophils Relative: 0 %
Eosinophils Absolute: 0.3 10*3/uL (ref 0.0–0.7)
Eosinophils Relative: 5 %
HCT: 35.4 % — ABNORMAL LOW (ref 39.0–52.0)
Hemoglobin: 11.3 g/dL — ABNORMAL LOW (ref 13.0–17.0)
Lymphocytes Relative: 23 %
Lymphs Abs: 1.4 10*3/uL (ref 0.7–4.0)
MCH: 30.5 pg (ref 26.0–34.0)
MCHC: 31.9 g/dL (ref 30.0–36.0)
MCV: 95.4 fL (ref 78.0–100.0)
Monocytes Absolute: 0.7 10*3/uL (ref 0.1–1.0)
Monocytes Relative: 12 %
Neutro Abs: 3.7 10*3/uL (ref 1.7–7.7)
Neutrophils Relative %: 60 %
Platelets: 228 10*3/uL (ref 150–400)
RBC: 3.71 MIL/uL — ABNORMAL LOW (ref 4.22–5.81)
RDW: 15.2 % (ref 11.5–15.5)
WBC: 6.1 10*3/uL (ref 4.0–10.5)

## 2017-09-04 LAB — PROTIME-INR
INR: 2.17
Prothrombin Time: 24 seconds — ABNORMAL HIGH (ref 11.4–15.2)

## 2017-09-04 NOTE — Progress Notes (Signed)
Location:    [facility] Room Number: 119/P Place of Service:  SNF [phone]) Provider: Veleta Miners MD  [name], MD  Patient Care Team: [name], MD as PCP - General (Internal Medicine) Carolan Clines, MD as Attending Physician (Urology) Prentiss Bells, MD (Ophthalmology) Carole Civil, MD (Orthopedic Surgery) Essenmacher, Clarene Duke, OT as Occupational Therapist (Occupational Therapy) Lonna Cobb, Austin [name] (Inactive) as Physical Therapist (Physical Therapy) Susy Frizzle, PTA as Physical Therapy Assistant (Physical Therapy) Neldon Labella, RN as [place] Management Standley Brooking, LCSW as [place] Management (Licensed Clinical Social Worker)  Extended Emergency Contact Information Primary Emergency Contact: [place] Area Address: [address] [place]          [place], [place] [address] [place] Phone: [phone] Work Phone: [phone] Mobile Phone: [phone] Relation: Spouse Secondary Emergency Contact: [place] Mobile Phone: [phone] Relation: Granddaughter  Code Status:  DNR Goals of care: Advanced Directive information Advanced Directives 09/04/2017  Does Patient Have a Medical Advance Directive? Yes  Type of Advance Directive Out of facility DNR (pink MOST or yellow form)  Does patient want to make changes to medical advance directive? No - Patient declined  Copy of [place] in Chart? No - copy requested  Some encounter information is confidential and restricted. Go to Review Flowsheets activity to see all data.     Chief Complaint  Patient presents with  . Medical Management of Chronic Issues    Patient is being seen for routine visit of medical management    HPI:  Austin [name] is a 84 y.o. male seen today for medical management of chronic diseases.     Past Medical History:  Diagnosis Date  .  ADENOCARCINOMA, PROSTATE, GLEASON GRADE 5 01/21/2009  . ALLERGIC RHINITIS 01/14/2007  . Bilateral inguinal hernia   . Cholecystitis   . Cholelithiasis   . CKD (chronic kidney disease) stage 3, GFR 30-59 ml/min (HCC) 04/30/2015  . COLONIC POLYPS, HX OF 01/14/2007  . ELEVATED PROSTATE SPECIFIC ANTIGEN 03/27/2008  . ESOPHAGITIS 01/14/2007  . GERD (gastroesophageal reflux disease)   . HYPERLIPIDEMIA 01/14/2007  . HYPERTENSION 01/14/2007  . LIBIDO, DECREASED 01/15/2007  . PAF (paroxysmal atrial fibrillation) ([facility])   . Paralysis ([facility])    from [place] 02/2013   Past Surgical History:  Procedure Laterality Date  . CHOLECYSTECTOMY N/A 08/17/2017   Procedure: LAPAROSCOPIC CHOLECYSTECTOMY WITH INTRAOPERATIVE CHOLANGIOGRAM ERAS PATHWAY;  Surgeon: Judeth Horn, MD;  Location: [facility];  Service: General;  Laterality: N/A;  . ENDOSCOPIC RETROGRADE CHOLANGIOPANCREATOGRAPHY (ERCP) WITH PROPOFOL N/A 08/18/2017   Procedure: ENDOSCOPIC RETROGRADE CHOLANGIOPANCREATOGRAPHY (ERCP) WITH PROPOFOL;  Surgeon: Doran Stabler, MD;  Location: [facility];  Service: Endoscopy;  Laterality: N/A;  . ERCP N/A 08/21/2017   Procedure: ENDOSCOPIC RETROGRADE CHOLANGIOPANCREATOGRAPHY (ERCP);  Surgeon: Clarene Essex, MD;  Location: [facility];  Service: Endoscopy;  Laterality: N/A;  . ESOPHAGOGASTRODUODENOSCOPY N/A 08/15/2013   Procedure: ESOPHAGOGASTRODUODENOSCOPY (EGD);  Surgeon: Gwenyth Ober, MD;  Location: [facility];  Service: General;  Laterality: N/A;  . ESOPHAGOGASTRODUODENOSCOPY (EGD) WITH PROPOFOL N/A 08/18/2017   Procedure: ESOPHAGOGASTRODUODENOSCOPY (EGD) WITH PROPOFOL;  Surgeon: Doran Stabler, MD;  Location: [facility];  Service: Endoscopy;  Laterality: N/A;  . HERNIA REPAIR     Bilateral Inguinal hernias  . HIP PINNING,CANNULATED Left 07/18/2017   Procedure: CANNULATED HIP PINNING;  Surgeon: Carole Civil, MD;  Location: [facility];  Service: Orthopedics;  Laterality: Left;  .  HIP SURGERY     Left  . IR  EXCHANGE BILIARY DRAIN  05/24/2017  . IR EXCHANGE BILIARY DRAIN  07/27/2017  . IR PERC CHOLECYSTOSTOMY  04/10/2017  . JOINT REPLACEMENT    . PANCREATIC STENT PLACEMENT  08/21/2017   Procedure: PANCREATIC STENT PLACEMENT;  Surgeon: Clarene Essex, MD;  Location: [facility];  Service: Endoscopy;;  . PEG PLACEMENT N/A 09/02/2013   Procedure: PERCUTANEOUS ENDOSCOPIC GASTROSTOMY (PEG) PLACEMENT;  Surgeon: Gwenyth Ober, MD;  Location: [facility];  Service: General;  Laterality: N/A;  . PROSTATE CRYOABLATION    . REMOVAL OF STONES  08/21/2017   Procedure: REMOVAL OF STONES;  Surgeon: Clarene Essex, MD;  Location[facility];  Service: Endoscopy;;  . Joan Mayans  08/21/2017   Procedure: Joan Mayans;  Surgeon: Clarene Essex, MD;  Location: [facility];  Service: Endoscopy;;    No Known Allergies  Outpatient Encounter Medications as of 09/04/2017  Medication Sig  . acetaminophen (TYLENOL) 325 MG tablet Take 650 mg by mouth every 8 (eight) hours as needed for moderate pain.   . cholecalciferol (VITAMIN D) 1000 units tablet Take 2,000 Units by mouth daily.  . furosemide (LASIX) 20 MG tablet Take 20 mg by mouth daily.  [name] gabapentin (NEURONTIN) 100 MG capsule Take 200-300 mg by mouth See admin instructions. Take 266m by mouth twice daily and 3050mat bedtime  . omeprazole (PRILOSEC) 40 MG capsule Take 40 mg by mouth daily.   . Marland KitchenxyCODONE (OXY IR/ROXICODONE) 5 MG immediate release tablet Take 1 tablet (5 mg total) by mouth every 4 (four) hours as needed for moderate pain.  . polyethylene glycol (MIRALAX / GLYCOLAX) packet Take 17 g by mouth daily.  . rosuvastatin (CRESTOR) 10 MG tablet Take 10 mg by mouth daily.  . Marland Kitchenenna (SENOKOT) 8.6 MG TABS tablet Take 1 tablet by mouth 2 (two) times daily.  . Marland Kitchenarfarin (COUMADIN) 5 MG tablet Take 1.5 tablets (7.5 mg total) by mouth daily at 6 PM. Do not take next dose until tomorrow, August 23, 2017   No facility-administered encounter medications on file as of  09/04/2017.      Review of Systems  There is no immunization history for the selected administration types on file for this patient. Pertinent  Health Maintenance Due  Topic Date Due  . INFLUENZA VACCINE  09/23/2017 (Originally 08/23/2017)  . PNA vac Low Risk Adult (1 of 2 - PCV13) 09/23/2017 (Originally 01/23/2000)   Fall Risk  07/16/2017 03/07/2017 11/22/2016 09/04/2016 02/28/2016  Falls in the past year? No No No No No  Number falls in past yr: - - - - -  Injury with Fall? - - - - -  Risk for fall due to : Impaired balance/gait;Impaired mobility - - - -  Risk for fall due to: Comment - - - - -   Functional Status Survey:    Vitals:   09/04/17 0822  BP: 109/66  Pulse: 62  Resp: 17  Temp: 98.1 F (36.7 C)  TempSrc: Oral  Weight: 180 lb (81.6 kg)  Height: 5' 6"  (1.676 m)   Body mass index is 29.05 kg/m. Physical Exam  Labs reviewed: Recent Labs    04/10/17 0437  04/10/17 2306  04/25/17 0249 04/26/17 0442 04/27/17 0557  08/22/17 0004 08/24/17 0700 08/28/17 0758  NA 137   < > 138   < > 140 140 140   < > 138 139 139  K 3.6   < > 3.8   < > 3.4* 3.3* 3.4*   < >  4.2 3.9 3.8  CL 105   < > 104   < > 103 104 104   < > 106 106 105  CO2 15*   < > 18*   < > 22 22 22    < > 25 28 28   GLUCOSE 193*   < > 179*   < > 78 79 78   < > 137* 94 96  BUN 37*   < > 45*   < > 47* 47* 45*   < > 17 22 27*  CREATININE 4.31*   < > 5.14*   < > 6.61* 6.12* 5.37*   < > 1.54* 1.56* 1.61*  CALCIUM 7.5*   < > 6.5*   < > 8.5* 8.6* 8.7*   < > 8.8* 8.9 9.0  MG 1.2*  --  1.5*  --   --   --   --   --   --   --   --   PHOS 5.1*  --  5.6*   < > 6.8* 6.6* 5.5*  --   --   --   --    < > = values in this interval not displayed.   Recent Labs    08/22/17 0004 08/24/17 0700 08/28/17 0758  AST 489* 33 21  ALT 238* 101* 40  ALKPHOS 217* 162* 129*  BILITOT 0.9 0.6 0.6  PROT 6.1* 6.4* 6.6  ALBUMIN 2.7* 2.9* 3.0*   Recent Labs    08/24/17 0700 08/28/17 0758 09/04/17 0752  WBC 9.0 8.1 6.1  NEUTROABS  5.8 5.4 3.7  HGB 10.8* 10.5* 11.3*  HCT 33.3* 32.8* 35.4*  MCV 94.6 94.3 95.4  PLT 143* 191 228   Lab Results  Component Value Date   TSH 1.73 04/30/2015   Lab Results  Component Value Date   HGBA1C 6.0 (H) 09/04/2013   Lab Results  Component Value Date   CHOL 165 05/18/2016   HDL 35.00 (L) 05/18/2016   LDLCALC 110 (H) 04/03/2013   LDLDIRECT 84.0 05/18/2016   TRIG 217.0 (H) 05/18/2016   CHOLHDL 5 05/18/2016    Significant Diagnostic Results in last 30 days:  Dg Cholangiogram Operative  Result Date: 08/17/2017 CLINICAL DATA:  Intraoperative cholangiogram EXAM: INTRAOPERATIVE CHOLANGIOGRAM TECHNIQUE: Cholangiographic images from the C-arm fluoroscopic device were submitted for interpretation post-operatively. Please see the procedural report for the amount of contrast and the fluoroscopy time utilized. COMPARISON:  None. FINDINGS: There are filling defects in the distal common bile duct. Contrast fills the biliary tree. Contrast does not enter the duodenum. An IVC filter is partially visualized. IMPRESSION: Findings are consistent with distal common bile duct obstruction secondary to duct stones. ERCP is recommended. Electronically Signed   By: Marybelle Killings M.D.   On: 08/17/2017 09:00   Dg Ercp Biliary & Pancreatic Ducts  Result Date: 08/21/2017 CLINICAL DATA:  84 year old male with a history of choledocholithiasis EXAM: ERCP TECHNIQUE: Multiple spot images obtained with the fluoroscopic device and submitted for interpretation post-procedure. FLUOROSCOPY TIME:  Fluoroscopy Time:  8 minutes 53 seconds COMPARISON:  08/17/2017 FINDINGS: Single limited intraoperative fluoroscopic spot images during ERCP. Image demonstrates endoscope projecting over the upper abdomen with partial opacification of the extrahepatic bile ducts and a safety wire in place. Partially visualized IVC filter IMPRESSION: Limited images during ERCP, demonstrates partial opacification of the extra hepatic biliary system.  Please refer to the dictated operative report for full details of intraoperative findings and procedure. Electronically Signed   By: Corrie Mckusick D.O.  On: 08/21/2017 09:56   Dg Hip Unilat With Pelvis 2-3 Views Left  Result Date: 08/06/2017 [facility] report Dictated by Dr. Aline Brochure Chief complaint hip fracture fixation Left hip x-ray with pelvis AP pelvis lateral and AP left hip 3 screws with washers fixing previously noted left femoral neck fracture X-rays show maintenance of reduction and no hardware complications Impression stable fixation left hip    Assessment/Plan There are no diagnoses linked to this encounter.   Family/ staff Communication:   Labs/tests ordered:      This encounter was created in error - please disregard.

## 2017-09-04 NOTE — Patient Outreach (Signed)
[facility]) Care Management  09/04/2017  ALWYN CORDNER [birth year] [id number]   CSW met with patient & his wife, [name] at [facility]. Patient's wife had taken him out into the courtyard for some sunshine. [name] informed CSW that she plans to take him home still but is working on getting more care in the home before he comes home. Patient is staying at [facility] under private pay currently. CSW encouraged [name] to call CSW once discharge date has been determined. CSW will follow-up in 2 weeks.    Raynaldo Opitz, LCSW [facility]  Clinical Social Worker cell #: [phone]

## 2017-09-05 ENCOUNTER — Non-Acute Institutional Stay (SKILLED_NURSING_FACILITY): Payer: Medicare Other | Admitting: Internal Medicine

## 2017-09-05 ENCOUNTER — Encounter: Payer: Self-pay | Admitting: Internal Medicine

## 2017-09-05 DIAGNOSIS — I480 Paroxysmal atrial fibrillation: Secondary | ICD-10-CM | POA: Diagnosis not present

## 2017-09-05 DIAGNOSIS — K8042 Calculus of bile duct with acute cholecystitis without obstruction: Secondary | ICD-10-CM

## 2017-09-05 DIAGNOSIS — D62 Acute posthemorrhagic anemia: Secondary | ICD-10-CM | POA: Diagnosis not present

## 2017-09-05 DIAGNOSIS — Z8781 Personal history of (healed) traumatic fracture: Secondary | ICD-10-CM

## 2017-09-05 DIAGNOSIS — I10 Essential (primary) hypertension: Secondary | ICD-10-CM | POA: Diagnosis not present

## 2017-09-05 DIAGNOSIS — Z967 Presence of other bone and tendon implants: Secondary | ICD-10-CM

## 2017-09-05 DIAGNOSIS — Z9889 Other specified postprocedural states: Secondary | ICD-10-CM
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
## 2017-09-06 ENCOUNTER — Encounter: Payer: Self-pay | Admitting: Internal Medicine

## 2017-09-07 ENCOUNTER — Ambulatory Visit: Payer: Medicare Other | Admitting: *Deleted

## 2017-09-10 ENCOUNTER — Other Ambulatory Visit: Payer: Self-pay

## 2017-09-10 NOTE — Patient Outreach (Addendum)
[facility]) Care Management  09/10/2017  Austin [name] [birth year] [id number]    Referral received from [facility] for transition of care services due to [facility] discharge on 09/07/17.   Initial outreach call complete. Spoke with member's spouse Austin [name]. She was aware of order for Home Health and was anticipating a call from [facility] later today. She reported that Austin [name] has not experienced falls since returning home and is taking medications as prescribed. She stated that a [place] is available to provide assistance from 8am-8pm.  Per spouse, Austin [name] will follow up with PCP on 09/14/17 and Orthopedic surgeon on 09/17/17. She reported multiple tentative appointments on this week and next week but was agreeable to possible home visit on next week. No urgent concerns identified during the call. Austin [name] was encouraged to contact RN CM with questions as needed.  PLAN RN CM will place additional outreach call and schedule initial home visit within the next week.   [place] [phone]

## 2017-09-11 ENCOUNTER — Other Ambulatory Visit: Payer: Self-pay | Admitting: *Deleted

## 2017-09-11 ENCOUNTER — Telehealth: Payer: Self-pay | Admitting: Internal Medicine

## 2017-09-11 DIAGNOSIS — Z8546 Personal history of malignant neoplasm of prostate: Secondary | ICD-10-CM | POA: Diagnosis not present

## 2017-09-11 DIAGNOSIS — I69354 Hemiplegia and hemiparesis following cerebral infarction affecting left non-dominant side: Secondary | ICD-10-CM | POA: Diagnosis not present

## 2017-09-11 DIAGNOSIS — D638 Anemia in other chronic diseases classified elsewhere: Secondary | ICD-10-CM | POA: Diagnosis not present

## 2017-09-11 DIAGNOSIS — I5020 Unspecified systolic (congestive) heart failure: Secondary | ICD-10-CM | POA: Diagnosis not present

## 2017-09-11 DIAGNOSIS — Z87891 Personal history of nicotine dependence: Secondary | ICD-10-CM | POA: Diagnosis not present

## 2017-09-11 DIAGNOSIS — Z7901 Long term (current) use of anticoagulants: Secondary | ICD-10-CM | POA: Diagnosis not present

## 2017-09-11 DIAGNOSIS — Z9181 History of falling: Secondary | ICD-10-CM | POA: Diagnosis not present

## 2017-09-11 DIAGNOSIS — Z86718 Personal history of other venous thrombosis and embolism: Secondary | ICD-10-CM | POA: Diagnosis not present

## 2017-09-11 DIAGNOSIS — S72065D Nondisplaced articular fracture of head of left femur, subsequent encounter for closed fracture with routine healing: Secondary | ICD-10-CM | POA: Diagnosis not present

## 2017-09-11 DIAGNOSIS — Z48815 Encounter for surgical aftercare following surgery on the digestive system: Secondary | ICD-10-CM | POA: Diagnosis not present

## 2017-09-11 DIAGNOSIS — I480 Paroxysmal atrial fibrillation: Secondary | ICD-10-CM | POA: Diagnosis not present

## 2017-09-11 DIAGNOSIS — K219 Gastro-esophageal reflux disease without esophagitis: Secondary | ICD-10-CM | POA: Diagnosis not present

## 2017-09-11 DIAGNOSIS — N183 Chronic kidney disease, stage 3 (moderate): Secondary | ICD-10-CM | POA: Diagnosis not present

## 2017-09-11 DIAGNOSIS — Z5181 Encounter for therapeutic drug level monitoring: Secondary | ICD-10-CM | POA: Diagnosis not present

## 2017-09-11 DIAGNOSIS — I130 Hypertensive heart and chronic kidney disease with heart failure and stage 1 through stage 4 chronic kidney disease, or unspecified chronic kidney disease: Secondary | ICD-10-CM | POA: Diagnosis not present

## 2017-09-11 NOTE — Telephone Encounter (Signed)
Copied from [place] [id number]. Topic: Quick Communication - See Telephone Encounter >> Sep 11, 2017  3:25 PM Synthia Innocent wrote: CRM for notification. See Telephone encounter for: 09/11/17. [facility] calling. Requesting verbal order for PT, 2x a week for 4 weeks.

## 2017-09-11 NOTE — Patient Outreach (Signed)
[facility]) Care Management  09/11/2017  Austin [name] 08/10/1932 [id number]   CSW received notification through PatientPing that patient discharged home from [facility] on Friday, 09/07/17. CSW called & spoke with patient's wife, [name] to confirm that patient has been doing well at home but that they are still continuing to adjust. Wife reports that she has made arrangements for a caregiver to be there 12 hours a day from 8am - 8pm and that has been working out for them. Patient's wife states that a nurse from [facility] was there during [facility] call so CSW let her get back to nurse's assessment. Wife reports no further CSW needs at this time. [facility] RNCM, Felecia to follow for transition of care.    Raynaldo Opitz, LCSW [facility]  Clinical Social Worker cell #: [phone]

## 2017-09-11 NOTE — Telephone Encounter (Signed)
Ok for verbals 

## 2017-09-11 NOTE — Telephone Encounter (Signed)
Verbal orders given  

## 2017-09-11 NOTE — Telephone Encounter (Signed)
Copied from [place] [phone]. Topic: Quick Communication - See Telephone Encounter >> Sep 11, 2017  3:53 PM Rutherford Nail, NT wrote: CRM for notification. See Telephone encounter for: 09/11/17. Malachy Mood with [facility] calling and states that she saw the patient today to start his care at home. States that she wanted to see if Dr Jenny Reichmann would be willing to sign off on orders.  CB#: [phone]

## 2017-09-12 NOTE — Telephone Encounter (Signed)
Called Amy no answer LMOM w/MD approval../lmb

## 2017-09-13 ENCOUNTER — Telehealth: Payer: Self-pay | Admitting: Internal Medicine

## 2017-09-13 ENCOUNTER — Ambulatory Visit (INDEPENDENT_AMBULATORY_CARE_PROVIDER_SITE_OTHER): Payer: Medicare Other | Admitting: Cardiology

## 2017-09-13 DIAGNOSIS — N183 Chronic kidney disease, stage 3 (moderate): Secondary | ICD-10-CM | POA: Diagnosis not present

## 2017-09-13 DIAGNOSIS — S72065D Nondisplaced articular fracture of head of left femur, subsequent encounter for closed fracture with routine healing: Secondary | ICD-10-CM | POA: Diagnosis not present

## 2017-09-13 DIAGNOSIS — Z48815 Encounter for surgical aftercare following surgery on the digestive system: Secondary | ICD-10-CM | POA: Diagnosis not present

## 2017-09-13 DIAGNOSIS — I5020 Unspecified systolic (congestive) heart failure: Secondary | ICD-10-CM | POA: Diagnosis not present

## 2017-09-13 DIAGNOSIS — I130 Hypertensive heart and chronic kidney disease with heart failure and stage 1 through stage 4 chronic kidney disease, or unspecified chronic kidney disease: Secondary | ICD-10-CM | POA: Diagnosis not present

## 2017-09-13 DIAGNOSIS — I69354 Hemiplegia and hemiparesis following cerebral infarction affecting left non-dominant side: Secondary | ICD-10-CM | POA: Diagnosis not present

## 2017-09-13 LAB — POCT INR: INR: 2.8 (ref 2.0–3.0)

## 2017-09-13 NOTE — Telephone Encounter (Signed)
We can try to draw at his visit; can they tell us which ones they were ordered but could not get done ? thanks

## 2017-09-13 NOTE — Telephone Encounter (Signed)
Copied from [place] [phone]. Topic: Quick Communication - See Telephone Encounter >> Sep 13, 2017 11:42 AM Margot Ables wrote: CRM for notification. See Telephone encounter for: 09/13/17.  Requesting VO for HH OT 2x week for 3 weeks Secure VM ok to leave detailed msg

## 2017-09-13 NOTE — Telephone Encounter (Signed)
FYI: pt is being seen tomorrow 09/14/17 for hospital follow-up

## 2017-09-13 NOTE — Telephone Encounter (Signed)
Ok for verbals 

## 2017-09-13 NOTE — Telephone Encounter (Signed)
Called pt no answer LMOM w/MD response../lmb 

## 2017-09-13 NOTE — Telephone Encounter (Signed)
Can labs be ordered for patient.

## 2017-09-13 NOTE — Telephone Encounter (Signed)
Copied from [place] [id number]. Topic: Quick Communication - See Telephone Encounter >> Sep 13, 2017  2:06 PM Mylinda Latina, NT wrote: CRM for notification. See Telephone encounter for: 09/13/17. Tammy calling from [facility] states they have orders to draw blood. She states they came out to draw and was unsuccessful due to this veins being small. She is wondering can the provider draw these labs when the patient come in for a visit.

## 2017-09-14 ENCOUNTER — Ambulatory Visit
Admission: RE | Admit: 2017-09-14 | Discharge: 2017-09-14 | Disposition: A | Discharge status: Home / Self Care | Payer: Medicare Other | Source: Ambulatory Visit | Attending: Gastroenterology | Admitting: Gastroenterology

## 2017-09-14 ENCOUNTER — Encounter: Payer: Self-pay | Admitting: Internal Medicine

## 2017-09-14 ENCOUNTER — Ambulatory Visit (INDEPENDENT_AMBULATORY_CARE_PROVIDER_SITE_OTHER): Payer: Medicare Other | Admitting: Internal Medicine

## 2017-09-14 ENCOUNTER — Other Ambulatory Visit: Payer: Self-pay | Admitting: Gastroenterology

## 2017-09-14 ENCOUNTER — Other Ambulatory Visit (INDEPENDENT_AMBULATORY_CARE_PROVIDER_SITE_OTHER): Payer: Medicare Other

## 2017-09-14 VITALS — BP 118/72 | HR 61 | Temp 98.0°F | Ht 66.0 in | Wt 180.0 lb

## 2017-09-14 DIAGNOSIS — K8042 Calculus of bile duct with acute cholecystitis without obstruction: Secondary | ICD-10-CM

## 2017-09-14 DIAGNOSIS — N183 Chronic kidney disease, stage 3 unspecified: Secondary | ICD-10-CM

## 2017-09-14 DIAGNOSIS — D62 Acute posthemorrhagic anemia: Secondary | ICD-10-CM

## 2017-09-14 DIAGNOSIS — K8050 Calculus of bile duct without cholangitis or cholecystitis without obstruction: Secondary | ICD-10-CM

## 2017-09-14 DIAGNOSIS — I10 Essential (primary) hypertension: Secondary | ICD-10-CM

## 2017-09-14 LAB — CBC WITH DIFFERENTIAL/PLATELET
Basophils Absolute: 0.1 10*3/uL (ref 0.0–0.1)
Basophils Relative: 1 % (ref 0.0–3.0)
Eosinophils Absolute: 0.2 10*3/uL (ref 0.0–0.7)
Eosinophils Relative: 3.8 % (ref 0.0–5.0)
HCT: 36.9 % — ABNORMAL LOW (ref 39.0–52.0)
Hemoglobin: 12.4 g/dL — ABNORMAL LOW (ref 13.0–17.0)
Lymphocytes Relative: 22.7 % (ref 12.0–46.0)
Lymphs Abs: 1.4 10*3/uL (ref 0.7–4.0)
MCHC: 33.6 g/dL (ref 30.0–36.0)
MCV: 90.9 fl (ref 78.0–100.0)
Monocytes Absolute: 0.6 10*3/uL (ref 0.1–1.0)
Monocytes Relative: 10.1 % (ref 3.0–12.0)
Neutro Abs: 3.9 10*3/uL (ref 1.4–7.7)
Neutrophils Relative %: 62.4 % (ref 43.0–77.0)
Platelets: 194 10*3/uL (ref 150.0–400.0)
RBC: 4.06 Mil/uL — ABNORMAL LOW (ref 4.22–5.81)
RDW: 15.8 % — ABNORMAL HIGH (ref 11.5–15.5)
WBC: 6.3 10*3/uL (ref 4.0–10.5)

## 2017-09-14 LAB — BASIC METABOLIC PANEL
BUN: 24 mg/dL — ABNORMAL HIGH (ref 6–23)
CO2: 29 mEq/L (ref 19–32)
Calcium: 9.6 mg/dL (ref 8.4–10.5)
Chloride: 108 mEq/L (ref 96–112)
Creatinine, Ser: 1.59 mg/dL — ABNORMAL HIGH (ref 0.40–1.50)
GFR: 44.45 mL/min — ABNORMAL LOW (ref >60.00)
Glucose, Bld: 96 mg/dL (ref 70–99)
Potassium: 3.9 mEq/L (ref 3.5–5.1)
Sodium: 143 mEq/L (ref 135–145)

## 2017-09-14 LAB — HEPATIC FUNCTION PANEL
ALT: 11 U/L (ref 0–53)
AST: 12 U/L (ref 0–37)
Albumin: 3.9 g/dL (ref 3.5–5.2)
Alkaline Phosphatase: 124 U/L — ABNORMAL HIGH (ref 39–117)
Bilirubin, Direct: 0.1 mg/dL (ref 0.0–0.3)
Total Bilirubin: 0.4 mg/dL (ref 0.2–1.2)
Total Protein: 7.3 g/dL (ref 6.0–8.3)

## 2017-09-14 LAB — IBC PANEL
Iron: 54 ug/dL (ref 42–165)
Saturation Ratios: 21.4 % (ref 20.0–50.0)
Transferrin: 180 mg/dL — ABNORMAL LOW (ref 212.0–360.0)

## 2017-09-14 NOTE — Patient Instructions (Signed)
Please continue all other medications as before, and refills have been done if requested.  Please have the pharmacy call with any other refills you may need.  Please continue your efforts at being more active, low cholesterol diet, and weight control.  You are otherwise up to date with prevention measures today.  Please keep your appointments with your specialists as you may have planned  Please go to the LAB in the Basement (turn left off the elevator) for the tests to be done today  You will be contacted by phone if any changes need to be made immediately.  Otherwise, you will receive a letter about your results with an explanation, but please check with MyChart first.  Please remember to sign up for MyChart if you have not done so, as this will be important to you in the future with finding out test results, communicating by private email, and scheduling acute appointments online when needed.  

## 2017-09-14 NOTE — Assessment & Plan Note (Addendum)
Also for f/u labs today as ordered  Note:  Total time for pt hx, exam, review of record with pt in the room, determination of diagnoses and plan for further eval and tx is > 40 min, with over 50% spent in coordination and counseling of patient including the differential dx, tx, further evaluation and other management of choledocholithiasis, HTN, CKD, and anemia

## 2017-09-14 NOTE — Assessment & Plan Note (Signed)
Also for iron and cbc f/u, no overt blood loss,  to f/u any worsening symptoms or concerns

## 2017-09-14 NOTE — Telephone Encounter (Signed)
Spoke with Malachy Mood, states the labs drawn were okay for what they were needing. Requested that we fax over results to [phone]

## 2017-09-14 NOTE — Progress Notes (Signed)
Subjective:    Patient ID: Austin [name], male    DOB: [birth year], 84 y.o.   MRN: [id number]   HPI  Here after admit to hosp July 26-7/31 after laparoscopic cholecystectomy because the intraoperative cholangiogram demonstrated an obstructing distal common bile ductal stone.  The initial ERCP performe on Saturday was unsuccessful at retrieving the stone, therefore he was set up for a repeat ERCP which was successful at removing the stone  The patient had an increase in his liver enzymes post procedure/ERCP which is very likely secondary to the manipulation of the common bile duct.  Clinically he was without abdominal pain, he had no fevers, and his WBC was close to normal. His postoperative pain has been minimal and he has not require a lot of pain medication.  Coumadin was restarted and d/c to SNF.  Also s/p left hip surgury with screws only for fracture in June 2019.  Home now for 1 wk, has [facility] for PT at home.  His postoperative pain has been minimal and he has not require a lot of pain medication.  INR yesterday per [facility] was about 2.9 by fingerstick. Now on coumadin 7.5 mg daily. Still needs other lab f/u today  Still declines immunizations today.  Now on iron supplement per NH facility, wondering if they can stop. For ortho f/u s/p left hip on Monday next wk.   Past Medical History:  Diagnosis Date  . ADENOCARCINOMA, PROSTATE, GLEASON GRADE 5 01/21/2009  . ALLERGIC RHINITIS 01/14/2007  . Bilateral inguinal hernia   . Cholecystitis   . Cholelithiasis   . CKD (chronic kidney disease) stage 3, GFR 30-59 ml/min (HCC) 04/30/2015  . COLONIC POLYPS, HX OF 01/14/2007  . ELEVATED PROSTATE SPECIFIC ANTIGEN 03/27/2008  . ESOPHAGITIS 01/14/2007  . GERD (gastroesophageal reflux disease)   . HYPERLIPIDEMIA 01/14/2007  . HYPERTENSION 01/14/2007  . LIBIDO, DECREASED 01/15/2007  . PAF (paroxysmal atrial fibrillation) ([place])   . Paralysis ([place])    from [place] 02/2013   Past Surgical History:  Procedure  Laterality Date  . CHOLECYSTECTOMY N/A 08/17/2017   Procedure: LAPAROSCOPIC CHOLECYSTECTOMY WITH INTRAOPERATIVE CHOLANGIOGRAM ERAS PATHWAY;  Surgeon: Judeth Horn, MD;  Location: [place];  Service: General;  Laterality: N/A;  . ENDOSCOPIC RETROGRADE CHOLANGIOPANCREATOGRAPHY (ERCP) WITH PROPOFOL N/A 08/18/2017   Procedure: ENDOSCOPIC RETROGRADE CHOLANGIOPANCREATOGRAPHY (ERCP) WITH PROPOFOL;  Surgeon: Doran Stabler, MD;  Location: [place];  Service: Endoscopy;  Laterality: N/A;  . ERCP N/A 08/21/2017   Procedure: ENDOSCOPIC RETROGRADE CHOLANGIOPANCREATOGRAPHY (ERCP);  Surgeon: Clarene Essex, MD;  Location: [place];  Service: Endoscopy;  Laterality: N/A;  . ESOPHAGOGASTRODUODENOSCOPY N/A 08/15/2013   Procedure: ESOPHAGOGASTRODUODENOSCOPY (EGD);  Surgeon: Gwenyth Ober, MD;  Location: [place];  Service: General;  Laterality: N/A;  . ESOPHAGOGASTRODUODENOSCOPY (EGD) WITH PROPOFOL N/A 08/18/2017   Procedure: ESOPHAGOGASTRODUODENOSCOPY (EGD) WITH PROPOFOL;  Surgeon: Doran Stabler, MD;  Location: [place];  Service: Endoscopy;  Laterality: N/A;  . HERNIA REPAIR     Bilateral Inguinal hernias  . HIP PINNING,CANNULATED Left 07/18/2017   Procedure: CANNULATED HIP PINNING;  Surgeon: Carole Civil, MD;  [facility];  Service: Orthopedics;  Laterality: Left;  . HIP SURGERY     Left  . IR EXCHANGE BILIARY DRAIN  05/24/2017  . IR EXCHANGE BILIARY DRAIN  07/27/2017  . IR PERC CHOLECYSTOSTOMY  04/10/2017  . JOINT REPLACEMENT    . PANCREATIC STENT PLACEMENT  08/21/2017   Procedure: PANCREATIC STENT PLACEMENT;  Surgeon: Clarene Essex, MD;  Location: [facility]  ENDOSCOPY;  Service: Endoscopy;;  . PEG PLACEMENT N/A 09/02/2013   Procedure: PERCUTANEOUS ENDOSCOPIC GASTROSTOMY (PEG) PLACEMENT;  Surgeon: Gwenyth Ober, MD;  Location: [place];  Service: General;  Laterality: N/A;  . PROSTATE CRYOABLATION    . REMOVAL OF STONES  08/21/2017   Procedure: REMOVAL OF STONES;  Surgeon: Clarene Essex, MD;   Location: [facility] ENDOSCOPY;  Service: Endoscopy;;  . Joan Mayans  08/21/2017   Procedure: Joan Mayans;  Surgeon: Clarene Essex, MD;  Location: [place];  Service: Endoscopy;;    reports that he has quit smoking. He has never used smokeless tobacco. He reports that he drank alcohol. He reports that he does not use drugs. family history includes Arthritis in his unknown relative; Hypertension in his other; Lung cancer in his father. No Known Allergies Current Outpatient Medications on File Prior to Visit  Medication Sig Dispense Refill  . acetaminophen (TYLENOL) 325 MG tablet Take 650 mg by mouth every 8 (eight) hours as needed for moderate pain.     . cholecalciferol (VITAMIN D) 1000 units tablet Take 2,000 Units by mouth daily.    . ferrous sulfate 220 (44 Fe) MG/5ML solution Give 10 cc by mouth twice a day    . furosemide (LASIX) 20 MG tablet Take 20 mg by mouth daily.    [name] gabapentin (NEURONTIN) 100 MG capsule Take 200-300 mg by mouth See admin instructions. Take 247m by mouth twice daily and 302mat bedtime    . omeprazole (PRILOSEC) 40 MG capsule Take 40 mg by mouth daily.     . Marland KitchenxyCODONE (OXY IR/ROXICODONE) 5 MG immediate release tablet Take 1 tablet (5 mg total) by mouth every 4 (four) hours as needed for moderate pain. 120 tablet 0  . polyethylene glycol (MIRALAX / GLYCOLAX) packet Take 17 g by mouth daily.    . rosuvastatin (CRESTOR) 10 MG tablet Take 10 mg by mouth daily.    . Marland Kitchenenna (SENOKOT) 8.6 MG TABS tablet Take 1 tablet by mouth 2 (two) times daily.    . Marland Kitchenarfarin (COUMADIN) 5 MG tablet Take 1.5 tablets (7.5 mg total) by mouth daily at 6 PM. Do not take next dose until tomorrow, August 23, 2017 1.5 tablet 0   No current facility-administered medications on file prior to visit.    Review of Systems  Constitutional: Negative for other unusual diaphoresis or sweats HENT: Negative for ear discharge or swelling Eyes: Negative for other worsening visual disturbances Respiratory:  Negative for stridor or other swelling  Gastrointestinal: Negative for worsening distension or other blood Genitourinary: Negative for retention or other urinary change Musculoskeletal: Negative for other MSK pain or swelling Skin: Negative for color change or other new lesions Neurological: Negative for worsening tremors and other numbness  Psychiatric/Behavioral: Negative for worsening agitation or other fatigue All other system neg per pt    Objective:   Physical Exam BP 118/72 (BP Location: Right Arm, Patient Position: Sitting, Cuff Size: Normal)   Pulse 61   Temp 98 F (36.7 C) (Oral)   Ht 5' 6"  (1.676 m)   Wt 180 lb (81.6 kg)   SpO2 96%   BMI 29.05 kg/m  VS noted, not ill appearing Constitutional: Pt appears in NAD HENT: Head: NCAT.  Right Ear: External ear normal.  Left Ear: External ear normal.  Eyes: . Pupils are equal, round, and reactive to light. Conjunctivae and EOM are normal Nose: without d/c or deformity Neck: Neck supple. Gross normal ROM Cardiovascular: Normal rate and regular rhythm.   Pulmonary/Chest:  Effort normal and breath sounds without rales or wheezing.  Abd:  Soft, NT, ND, + BS, no organomegaly Neurological: [name] alert. At baseline orientation, motor grossly intact to right side only Skin: Skin is warm. No rashes, other new lesions, no LE edema Psychiatric: Pt behavior is normal without agitation but flat affect No other exam findings Lab Results  Component Value Date   WBC 6.3 09/14/2017   HGB 12.4 (L) 09/14/2017   HCT 36.9 (L) 09/14/2017   PLT 194.0 09/14/2017   GLUCOSE 96 09/14/2017   CHOL 165 05/18/2016   TRIG 217.0 (H) 05/18/2016   HDL 35.00 (L) 05/18/2016   LDLDIRECT 84.0 05/18/2016   LDLCALC 110 (H) 04/03/2013   ALT 11 09/14/2017   AST 12 09/14/2017   NA 143 09/14/2017   K 3.9 09/14/2017   CL 108 09/14/2017   CREATININE 1.59 (H) 09/14/2017   BUN 24 (H) 09/14/2017   CO2 29 09/14/2017   TSH 1.73 04/30/2015   PSA 7.27 (H)  04/14/2008   INR 2.8 09/13/2017   HGBA1C 6.0 (H) 09/04/2013      Assessment & Plan:

## 2017-09-14 NOTE — Assessment & Plan Note (Signed)
stable overall by history and exam, recent data reviewed with pt, and pt to continue medical treatment as before,  to f/u any worsening symptoms or concerns  

## 2017-09-14 NOTE — Assessment & Plan Note (Signed)
stable overall by history and exam, recent data reviewed with pt, and pt to continue medical treatment as before,  to f/u any worsening symptoms or concerns BP Readings from Last 3 Encounters:  09/14/17 118/72  09/05/17 120/64  09/04/17 109/66

## 2017-09-17 ENCOUNTER — Ambulatory Visit (INDEPENDENT_AMBULATORY_CARE_PROVIDER_SITE_OTHER): Payer: Medicare Other | Admitting: Orthopedic Surgery

## 2017-09-17 ENCOUNTER — Ambulatory Visit (INDEPENDENT_AMBULATORY_CARE_PROVIDER_SITE_OTHER): Payer: Medicare Other

## 2017-09-17 VITALS — BP 116/66 | HR 63 | Ht 66.5 in

## 2017-09-17 DIAGNOSIS — Z7901 Long term (current) use of anticoagulants: Secondary | ICD-10-CM | POA: Diagnosis not present

## 2017-09-17 DIAGNOSIS — D638 Anemia in other chronic diseases classified elsewhere: Secondary | ICD-10-CM

## 2017-09-17 DIAGNOSIS — Z8781 Personal history of (healed) traumatic fracture: Secondary | ICD-10-CM

## 2017-09-17 DIAGNOSIS — I480 Paroxysmal atrial fibrillation: Secondary | ICD-10-CM

## 2017-09-17 DIAGNOSIS — Z87891 Personal history of nicotine dependence: Secondary | ICD-10-CM

## 2017-09-17 DIAGNOSIS — Z967 Presence of other bone and tendon implants: Secondary | ICD-10-CM | POA: Diagnosis not present

## 2017-09-17 DIAGNOSIS — I69354 Hemiplegia and hemiparesis following cerebral infarction affecting left non-dominant side: Secondary | ICD-10-CM

## 2017-09-17 DIAGNOSIS — Z9889 Other specified postprocedural states: Secondary | ICD-10-CM

## 2017-09-17 DIAGNOSIS — S72065D Nondisplaced articular fracture of head of left femur, subsequent encounter for closed fracture with routine healing: Secondary | ICD-10-CM

## 2017-09-17 DIAGNOSIS — Z8546 Personal history of malignant neoplasm of prostate: Secondary | ICD-10-CM

## 2017-09-17 DIAGNOSIS — M25511 Pain in right shoulder: Secondary | ICD-10-CM | POA: Diagnosis not present

## 2017-09-17 DIAGNOSIS — Z5181 Encounter for therapeutic drug level monitoring: Secondary | ICD-10-CM

## 2017-09-17 DIAGNOSIS — I5020 Unspecified systolic (congestive) heart failure: Secondary | ICD-10-CM

## 2017-09-17 DIAGNOSIS — N183 Chronic kidney disease, stage 3 (moderate): Secondary | ICD-10-CM

## 2017-09-17 DIAGNOSIS — K219 Gastro-esophageal reflux disease without esophagitis: Secondary | ICD-10-CM | POA: Diagnosis not present

## 2017-09-17 DIAGNOSIS — G8929 Other chronic pain: Secondary | ICD-10-CM | POA: Diagnosis not present

## 2017-09-17 DIAGNOSIS — Z9181 History of falling: Secondary | ICD-10-CM

## 2017-09-17 DIAGNOSIS — Z86718 Personal history of other venous thrombosis and embolism: Secondary | ICD-10-CM

## 2017-09-17 DIAGNOSIS — I130 Hypertensive heart and chronic kidney disease with heart failure and stage 1 through stage 4 chronic kidney disease, or unspecified chronic kidney disease: Secondary | ICD-10-CM

## 2017-09-17 NOTE — Progress Notes (Signed)
Chief Complaint  Patient presents with  . Hip Pain    Left Hip fx DOS 07/18/17   2 months after femoral neck fracture with internal fixation doing well complains of some right shoulder pain  Exam of the right shoulder shows weakness in abduction and flexion painful passive and active range of motion  Recommend physical therapy for the right shoulder  Continue therapy left hip  Today's x-ray shows a left hip fracture is healing well and the hardware is intact  Follow-up in a month if no improvement consider injection right shoulder and/or MRI right shoulder  Encounter Diagnoses  Name Primary?  . S/P ORIF (open reduction internal fixation) fracture left hip cannulated screw placement 07/18/17 Yes  . Chronic right shoulder pain

## 2017-09-18 ENCOUNTER — Ambulatory Visit: Payer: Medicare Other | Admitting: *Deleted

## 2017-09-18 DIAGNOSIS — N183 Chronic kidney disease, stage 3 (moderate): Secondary | ICD-10-CM | POA: Diagnosis not present

## 2017-09-18 DIAGNOSIS — Z48815 Encounter for surgical aftercare following surgery on the digestive system: Secondary | ICD-10-CM | POA: Diagnosis not present

## 2017-09-18 DIAGNOSIS — S72065D Nondisplaced articular fracture of head of left femur, subsequent encounter for closed fracture with routine healing: Secondary | ICD-10-CM | POA: Diagnosis not present

## 2017-09-18 DIAGNOSIS — I69354 Hemiplegia and hemiparesis following cerebral infarction affecting left non-dominant side: Secondary | ICD-10-CM | POA: Diagnosis not present

## 2017-09-18 DIAGNOSIS — I130 Hypertensive heart and chronic kidney disease with heart failure and stage 1 through stage 4 chronic kidney disease, or unspecified chronic kidney disease: Secondary | ICD-10-CM | POA: Diagnosis not present

## 2017-09-18 DIAGNOSIS — I5020 Unspecified systolic (congestive) heart failure: Secondary | ICD-10-CM | POA: Diagnosis not present

## 2017-09-20 DIAGNOSIS — N183 Chronic kidney disease, stage 3 (moderate): Secondary | ICD-10-CM | POA: Diagnosis not present

## 2017-09-20 DIAGNOSIS — Z48815 Encounter for surgical aftercare following surgery on the digestive system: Secondary | ICD-10-CM | POA: Diagnosis not present

## 2017-09-20 DIAGNOSIS — S72065D Nondisplaced articular fracture of head of left femur, subsequent encounter for closed fracture with routine healing: Secondary | ICD-10-CM | POA: Diagnosis not present

## 2017-09-20 DIAGNOSIS — I130 Hypertensive heart and chronic kidney disease with heart failure and stage 1 through stage 4 chronic kidney disease, or unspecified chronic kidney disease: Secondary | ICD-10-CM | POA: Diagnosis not present

## 2017-09-20 DIAGNOSIS — I69354 Hemiplegia and hemiparesis following cerebral infarction affecting left non-dominant side: Secondary | ICD-10-CM | POA: Diagnosis not present

## 2017-09-20 DIAGNOSIS — I5020 Unspecified systolic (congestive) heart failure: Secondary | ICD-10-CM | POA: Diagnosis not present

## 2017-09-21 ENCOUNTER — Ambulatory Visit (INDEPENDENT_AMBULATORY_CARE_PROVIDER_SITE_OTHER): Payer: Medicare Other | Admitting: Internal Medicine

## 2017-09-21 ENCOUNTER — Other Ambulatory Visit: Payer: Self-pay

## 2017-09-21 ENCOUNTER — Telehealth: Payer: Self-pay | Admitting: *Deleted

## 2017-09-21 DIAGNOSIS — I5020 Unspecified systolic (congestive) heart failure: Secondary | ICD-10-CM | POA: Diagnosis not present

## 2017-09-21 DIAGNOSIS — I130 Hypertensive heart and chronic kidney disease with heart failure and stage 1 through stage 4 chronic kidney disease, or unspecified chronic kidney disease: Secondary | ICD-10-CM | POA: Diagnosis not present

## 2017-09-21 DIAGNOSIS — I69354 Hemiplegia and hemiparesis following cerebral infarction affecting left non-dominant side: Secondary | ICD-10-CM | POA: Diagnosis not present

## 2017-09-21 DIAGNOSIS — S72065D Nondisplaced articular fracture of head of left femur, subsequent encounter for closed fracture with routine healing: Secondary | ICD-10-CM | POA: Diagnosis not present

## 2017-09-21 DIAGNOSIS — Z48815 Encounter for surgical aftercare following surgery on the digestive system: Secondary | ICD-10-CM | POA: Diagnosis not present

## 2017-09-21 DIAGNOSIS — N183 Chronic kidney disease, stage 3 (moderate): Secondary | ICD-10-CM | POA: Diagnosis not present

## 2017-09-21 DIAGNOSIS — Z5181 Encounter for therapeutic drug level monitoring: Secondary | ICD-10-CM

## 2017-09-21 LAB — POCT INR: INR: 2.7 (ref 2.0–3.0)

## 2017-09-21 NOTE — Telephone Encounter (Signed)
Please call Estill Bamberg w/ [facility] [phone]   INR 2.7

## 2017-09-21 NOTE — Telephone Encounter (Signed)
Please refer to Anticoagulation Encounter. Thanks.

## 2017-09-21 NOTE — Patient Outreach (Signed)
[facility] [facility]) Care Management  09/21/2017  Austin [name] 1932-06-18 [id number]   Outreach call placed to Austin [name]. She reported that Austin [name] was doing well and at home with personal health aide at the time of the call. No falls reported. Austin [name] was unable to verify Austin [name] medications due to not having the medication list on hand but reported that he was taking medications as prescribed since discharge. Discussed plan for completion of initial assessment. She prefers that Austin [name] receive a home visit and reported that he should be available on 09/28/17.  She agreed to follow up with RNCM with questions or concerns as needed.   PLAN  Will follow up on 09/28/17 as scheduled.   [place] [phone]

## 2017-09-21 NOTE — Patient Instructions (Addendum)
Description   Spoke with Estill Bamberg RN with [facility] & instructed pt to continue coumadin 1 1/2 tablets daily. Recheck in 11 days on Wednesday 10/03/17.

## 2017-09-25 DIAGNOSIS — S72065D Nondisplaced articular fracture of head of left femur, subsequent encounter for closed fracture with routine healing: Secondary | ICD-10-CM | POA: Diagnosis not present

## 2017-09-25 DIAGNOSIS — I69354 Hemiplegia and hemiparesis following cerebral infarction affecting left non-dominant side: Secondary | ICD-10-CM | POA: Diagnosis not present

## 2017-09-25 DIAGNOSIS — I5020 Unspecified systolic (congestive) heart failure: Secondary | ICD-10-CM | POA: Diagnosis not present

## 2017-09-25 DIAGNOSIS — I130 Hypertensive heart and chronic kidney disease with heart failure and stage 1 through stage 4 chronic kidney disease, or unspecified chronic kidney disease: Secondary | ICD-10-CM | POA: Diagnosis not present

## 2017-09-25 DIAGNOSIS — Z48815 Encounter for surgical aftercare following surgery on the digestive system: Secondary | ICD-10-CM | POA: Diagnosis not present

## 2017-09-25 DIAGNOSIS — N183 Chronic kidney disease, stage 3 (moderate): Secondary | ICD-10-CM | POA: Diagnosis not present

## 2017-09-26 DIAGNOSIS — I5020 Unspecified systolic (congestive) heart failure: Secondary | ICD-10-CM | POA: Diagnosis not present

## 2017-09-26 DIAGNOSIS — S72065D Nondisplaced articular fracture of head of left femur, subsequent encounter for closed fracture with routine healing: Secondary | ICD-10-CM | POA: Diagnosis not present

## 2017-09-26 DIAGNOSIS — Z48815 Encounter for surgical aftercare following surgery on the digestive system: Secondary | ICD-10-CM | POA: Diagnosis not present

## 2017-09-26 DIAGNOSIS — I69354 Hemiplegia and hemiparesis following cerebral infarction affecting left non-dominant side: Secondary | ICD-10-CM | POA: Diagnosis not present

## 2017-09-26 DIAGNOSIS — N183 Chronic kidney disease, stage 3 (moderate): Secondary | ICD-10-CM | POA: Diagnosis not present

## 2017-09-26 DIAGNOSIS — I130 Hypertensive heart and chronic kidney disease with heart failure and stage 1 through stage 4 chronic kidney disease, or unspecified chronic kidney disease: Secondary | ICD-10-CM | POA: Diagnosis not present

## 2017-09-27 DIAGNOSIS — N183 Chronic kidney disease, stage 3 (moderate): Secondary | ICD-10-CM | POA: Diagnosis not present

## 2017-09-27 DIAGNOSIS — C61 Malignant neoplasm of prostate: Secondary | ICD-10-CM | POA: Diagnosis not present

## 2017-09-27 DIAGNOSIS — N312 Flaccid neuropathic bladder, not elsewhere classified: Secondary | ICD-10-CM | POA: Diagnosis not present

## 2017-09-28 ENCOUNTER — Other Ambulatory Visit: Payer: Self-pay

## 2017-09-28 DIAGNOSIS — I5020 Unspecified systolic (congestive) heart failure: Secondary | ICD-10-CM | POA: Diagnosis not present

## 2017-09-28 DIAGNOSIS — I130 Hypertensive heart and chronic kidney disease with heart failure and stage 1 through stage 4 chronic kidney disease, or unspecified chronic kidney disease: Secondary | ICD-10-CM | POA: Diagnosis not present

## 2017-09-28 DIAGNOSIS — S72065D Nondisplaced articular fracture of head of left femur, subsequent encounter for closed fracture with routine healing: Secondary | ICD-10-CM | POA: Diagnosis not present

## 2017-09-28 DIAGNOSIS — I69354 Hemiplegia and hemiparesis following cerebral infarction affecting left non-dominant side: Secondary | ICD-10-CM | POA: Diagnosis not present

## 2017-09-28 DIAGNOSIS — N183 Chronic kidney disease, stage 3 (moderate): Secondary | ICD-10-CM | POA: Diagnosis not present

## 2017-09-28 DIAGNOSIS — Z48815 Encounter for surgical aftercare following surgery on the digestive system: Secondary | ICD-10-CM | POA: Diagnosis not present

## 2017-09-28 NOTE — Patient Outreach (Signed)
[facility]) Care Management  09/28/2017  KAGEN KUNATH 03/21/1932 [id number]   Member unavailable for initial home visit. Received call back from Mr. Creps spouse. RN CM will follow up for initial home visit on next week.   [place] [phone]

## 2017-10-01 DIAGNOSIS — S72065D Nondisplaced articular fracture of head of left femur, subsequent encounter for closed fracture with routine healing: Secondary | ICD-10-CM | POA: Diagnosis not present

## 2017-10-01 DIAGNOSIS — N183 Chronic kidney disease, stage 3 (moderate): Secondary | ICD-10-CM | POA: Diagnosis not present

## 2017-10-01 DIAGNOSIS — I130 Hypertensive heart and chronic kidney disease with heart failure and stage 1 through stage 4 chronic kidney disease, or unspecified chronic kidney disease: Secondary | ICD-10-CM | POA: Diagnosis not present

## 2017-10-01 DIAGNOSIS — Z48815 Encounter for surgical aftercare following surgery on the digestive system: Secondary | ICD-10-CM | POA: Diagnosis not present

## 2017-10-01 DIAGNOSIS — I69354 Hemiplegia and hemiparesis following cerebral infarction affecting left non-dominant side: Secondary | ICD-10-CM | POA: Diagnosis not present

## 2017-10-01 DIAGNOSIS — I5020 Unspecified systolic (congestive) heart failure: Secondary | ICD-10-CM | POA: Diagnosis not present

## 2017-10-02 ENCOUNTER — Ambulatory Visit (INDEPENDENT_AMBULATORY_CARE_PROVIDER_SITE_OTHER): Payer: Medicare Other | Admitting: *Deleted

## 2017-10-02 ENCOUNTER — Telehealth: Payer: Self-pay | Admitting: *Deleted

## 2017-10-02 ENCOUNTER — Other Ambulatory Visit: Payer: Self-pay

## 2017-10-02 DIAGNOSIS — S72065D Nondisplaced articular fracture of head of left femur, subsequent encounter for closed fracture with routine healing: Secondary | ICD-10-CM | POA: Diagnosis not present

## 2017-10-02 DIAGNOSIS — Z5181 Encounter for therapeutic drug level monitoring: Secondary | ICD-10-CM

## 2017-10-02 DIAGNOSIS — I4891 Unspecified atrial fibrillation: Secondary | ICD-10-CM

## 2017-10-02 DIAGNOSIS — I5020 Unspecified systolic (congestive) heart failure: Secondary | ICD-10-CM | POA: Diagnosis not present

## 2017-10-02 DIAGNOSIS — Z48815 Encounter for surgical aftercare following surgery on the digestive system: Secondary | ICD-10-CM | POA: Diagnosis not present

## 2017-10-02 DIAGNOSIS — N183 Chronic kidney disease, stage 3 (moderate): Secondary | ICD-10-CM | POA: Diagnosis not present

## 2017-10-02 DIAGNOSIS — I130 Hypertensive heart and chronic kidney disease with heart failure and stage 1 through stage 4 chronic kidney disease, or unspecified chronic kidney disease: Secondary | ICD-10-CM | POA: Diagnosis not present

## 2017-10-02 DIAGNOSIS — I69354 Hemiplegia and hemiparesis following cerebral infarction affecting left non-dominant side: Secondary | ICD-10-CM | POA: Diagnosis not present

## 2017-10-02 LAB — POCT INR: INR: 3.2 — AB (ref 2.0–3.0)

## 2017-10-02 NOTE — Telephone Encounter (Signed)
Done.  See coumadin note. 

## 2017-10-02 NOTE — Patient Instructions (Signed)
Take coumadin 1/2 tablet tonight then resume 1 1/2 tablets daily. Order given to [place] in 1 week

## 2017-10-02 NOTE — Telephone Encounter (Signed)
Cheryl with [facility] called for Edrick Oh, RN states she needs to speak with Lattie Haw about patients coumdin.  Please call [phone].

## 2017-10-04 ENCOUNTER — Telehealth: Payer: Self-pay | Admitting: Internal Medicine

## 2017-10-04 DIAGNOSIS — I130 Hypertensive heart and chronic kidney disease with heart failure and stage 1 through stage 4 chronic kidney disease, or unspecified chronic kidney disease: Secondary | ICD-10-CM | POA: Diagnosis not present

## 2017-10-04 DIAGNOSIS — I69354 Hemiplegia and hemiparesis following cerebral infarction affecting left non-dominant side: Secondary | ICD-10-CM | POA: Diagnosis not present

## 2017-10-04 DIAGNOSIS — Z48815 Encounter for surgical aftercare following surgery on the digestive system: Secondary | ICD-10-CM | POA: Diagnosis not present

## 2017-10-04 DIAGNOSIS — N183 Chronic kidney disease, stage 3 (moderate): Secondary | ICD-10-CM | POA: Diagnosis not present

## 2017-10-04 DIAGNOSIS — I5020 Unspecified systolic (congestive) heart failure: Secondary | ICD-10-CM | POA: Diagnosis not present

## 2017-10-04 DIAGNOSIS — S72065D Nondisplaced articular fracture of head of left femur, subsequent encounter for closed fracture with routine healing: Secondary | ICD-10-CM | POA: Diagnosis not present

## 2017-10-04 NOTE — Telephone Encounter (Signed)
Copied from [place] [phone]. Topic: General - Other >> Oct 04, 2017  1:44 PM Yvette Rack wrote: Reason for CRM: Nurse Malachy Mood from [facility] [phone] calling to let provider know that she was at the house today and heard some fine crackle in lower lobes pt is stable with no SOB oxygen is good vitals is normal

## 2017-10-04 NOTE — Telephone Encounter (Signed)
Verbals given via VM

## 2017-10-04 NOTE — Telephone Encounter (Signed)
Copied from [place]. Topic: General - Other >> Oct 04, 2017 12:02 PM Carolyn Stare wrote:  Wendelyn Breslow a OT with [facility] to req to extend his OT 2 X 2 and   PT 2 X 0     [phone]

## 2017-10-08 DIAGNOSIS — Z48815 Encounter for surgical aftercare following surgery on the digestive system: Secondary | ICD-10-CM | POA: Diagnosis not present

## 2017-10-08 DIAGNOSIS — S72065D Nondisplaced articular fracture of head of left femur, subsequent encounter for closed fracture with routine healing: Secondary | ICD-10-CM | POA: Diagnosis not present

## 2017-10-08 DIAGNOSIS — N183 Chronic kidney disease, stage 3 (moderate): Secondary | ICD-10-CM | POA: Diagnosis not present

## 2017-10-08 DIAGNOSIS — I130 Hypertensive heart and chronic kidney disease with heart failure and stage 1 through stage 4 chronic kidney disease, or unspecified chronic kidney disease: Secondary | ICD-10-CM | POA: Diagnosis not present

## 2017-10-08 DIAGNOSIS — I69354 Hemiplegia and hemiparesis following cerebral infarction affecting left non-dominant side: Secondary | ICD-10-CM | POA: Diagnosis not present

## 2017-10-08 DIAGNOSIS — I5020 Unspecified systolic (congestive) heart failure: Secondary | ICD-10-CM | POA: Diagnosis not present

## 2017-10-10 ENCOUNTER — Other Ambulatory Visit: Payer: Self-pay

## 2017-10-10 NOTE — Patient Outreach (Signed)
[facility]) Care Management  10/10/2017  AQIB LOUGH [birth year] [id number]   RNCM  received message from member's spouse Tre Sanker. She stated that Mr. Bristol would not be available for his scheduled home visit. She will inform RNCM of his availability.  PLAN Follow up pending.    [place] [phone]

## 2017-10-11 DIAGNOSIS — I5020 Unspecified systolic (congestive) heart failure: Secondary | ICD-10-CM | POA: Diagnosis not present

## 2017-10-11 DIAGNOSIS — I69354 Hemiplegia and hemiparesis following cerebral infarction affecting left non-dominant side: Secondary | ICD-10-CM | POA: Diagnosis not present

## 2017-10-11 DIAGNOSIS — Z48815 Encounter for surgical aftercare following surgery on the digestive system: Secondary | ICD-10-CM | POA: Diagnosis not present

## 2017-10-11 DIAGNOSIS — N183 Chronic kidney disease, stage 3 (moderate): Secondary | ICD-10-CM | POA: Diagnosis not present

## 2017-10-11 DIAGNOSIS — I130 Hypertensive heart and chronic kidney disease with heart failure and stage 1 through stage 4 chronic kidney disease, or unspecified chronic kidney disease: Secondary | ICD-10-CM | POA: Diagnosis not present

## 2017-10-11 DIAGNOSIS — S72065D Nondisplaced articular fracture of head of left femur, subsequent encounter for closed fracture with routine healing: Secondary | ICD-10-CM | POA: Diagnosis not present

## 2017-10-13 ENCOUNTER — Other Ambulatory Visit: Payer: Self-pay | Admitting: Internal Medicine

## 2017-10-15 ENCOUNTER — Ambulatory Visit (INDEPENDENT_AMBULATORY_CARE_PROVIDER_SITE_OTHER): Payer: Medicare Other

## 2017-10-15 ENCOUNTER — Ambulatory Visit (INDEPENDENT_AMBULATORY_CARE_PROVIDER_SITE_OTHER): Payer: Medicare Other | Admitting: Orthopedic Surgery

## 2017-10-15 VITALS — BP 124/70 | HR 66 | Ht 66.5 in

## 2017-10-15 DIAGNOSIS — Z9889 Other specified postprocedural states: Secondary | ICD-10-CM

## 2017-10-15 DIAGNOSIS — Z967 Presence of other bone and tendon implants: Secondary | ICD-10-CM

## 2017-10-15 DIAGNOSIS — Z8781 Personal history of (healed) traumatic fracture: Secondary | ICD-10-CM | POA: Diagnosis not present

## 2017-10-15 NOTE — Telephone Encounter (Signed)
Ok to Rf gabapentin 300 mg? It is not previously prescribed by you. 100 mg was refilled today.

## 2017-10-15 NOTE — Progress Notes (Signed)
Post op   Chief Complaint  Patient presents with  . Follow-up    Recheck on left hip, DOS 07-18-17.   Pod # 39  Encounter Diagnosis  Name Primary?  . S/P ORIF (open reduction internal fixation) fracture left hip cannulated screw placement 07/18/17 Yes     X-ray today shows complete fracture resolution with intact hardware please see report  Patient is doing well with his left hip  We should schedule him for follow-up at 1 years time to check for avascular necrosis or nonunion or sooner if symptoms warrant

## 2017-10-16 ENCOUNTER — Other Ambulatory Visit: Payer: Self-pay

## 2017-10-16 DIAGNOSIS — I130 Hypertensive heart and chronic kidney disease with heart failure and stage 1 through stage 4 chronic kidney disease, or unspecified chronic kidney disease: Secondary | ICD-10-CM | POA: Diagnosis not present

## 2017-10-16 DIAGNOSIS — I69354 Hemiplegia and hemiparesis following cerebral infarction affecting left non-dominant side: Secondary | ICD-10-CM | POA: Diagnosis not present

## 2017-10-16 DIAGNOSIS — Z48815 Encounter for surgical aftercare following surgery on the digestive system: Secondary | ICD-10-CM | POA: Diagnosis not present

## 2017-10-16 DIAGNOSIS — I5020 Unspecified systolic (congestive) heart failure: Secondary | ICD-10-CM | POA: Diagnosis not present

## 2017-10-16 DIAGNOSIS — N183 Chronic kidney disease, stage 3 (moderate): Secondary | ICD-10-CM | POA: Diagnosis not present

## 2017-10-16 DIAGNOSIS — S72065D Nondisplaced articular fracture of head of left femur, subsequent encounter for closed fracture with routine healing: Secondary | ICD-10-CM | POA: Diagnosis not present

## 2017-10-16 NOTE — Patient Outreach (Signed)
[place] [facility]) Care Management  10/16/2017  DRAGO HAMMONDS [birth year] [id number]   RNCM placed outreach call. No answer. Left HIPAA compliant voice message requesting a return call.   PLAN RNCM will follow up in 3-4 business days.   [place] [phone]

## 2017-10-17 ENCOUNTER — Ambulatory Visit (INDEPENDENT_AMBULATORY_CARE_PROVIDER_SITE_OTHER): Payer: Medicare Other | Admitting: *Deleted

## 2017-10-17 DIAGNOSIS — N183 Chronic kidney disease, stage 3 (moderate): Secondary | ICD-10-CM | POA: Diagnosis not present

## 2017-10-17 DIAGNOSIS — Z48815 Encounter for surgical aftercare following surgery on the digestive system: Secondary | ICD-10-CM | POA: Diagnosis not present

## 2017-10-17 DIAGNOSIS — S72065D Nondisplaced articular fracture of head of left femur, subsequent encounter for closed fracture with routine healing: Secondary | ICD-10-CM | POA: Diagnosis not present

## 2017-10-17 DIAGNOSIS — I69354 Hemiplegia and hemiparesis following cerebral infarction affecting left non-dominant side: Secondary | ICD-10-CM | POA: Diagnosis not present

## 2017-10-17 DIAGNOSIS — Z5181 Encounter for therapeutic drug level monitoring: Secondary | ICD-10-CM

## 2017-10-17 DIAGNOSIS — I480 Paroxysmal atrial fibrillation: Secondary | ICD-10-CM | POA: Diagnosis not present

## 2017-10-17 DIAGNOSIS — I130 Hypertensive heart and chronic kidney disease with heart failure and stage 1 through stage 4 chronic kidney disease, or unspecified chronic kidney disease: Secondary | ICD-10-CM | POA: Diagnosis not present

## 2017-10-17 DIAGNOSIS — I5020 Unspecified systolic (congestive) heart failure: Secondary | ICD-10-CM | POA: Diagnosis not present

## 2017-10-17 LAB — POCT INR: INR: 2.5 (ref 2.0–3.0)

## 2017-10-17 NOTE — Patient Instructions (Signed)
Continue coumadin 1 1/2 tablets daily. Order given to [place] in 2 weeks

## 2017-10-18 ENCOUNTER — Encounter: Payer: Self-pay | Admitting: Cardiology

## 2017-10-18 ENCOUNTER — Ambulatory Visit (INDEPENDENT_AMBULATORY_CARE_PROVIDER_SITE_OTHER): Payer: Medicare Other | Admitting: Cardiology

## 2017-10-18 VITALS — BP 130/60 | HR 84 | Resp 12 | Ht 66.5 in | Wt 180.0 lb

## 2017-10-18 DIAGNOSIS — I10 Essential (primary) hypertension: Secondary | ICD-10-CM | POA: Diagnosis not present

## 2017-10-18 DIAGNOSIS — E782 Mixed hyperlipidemia: Secondary | ICD-10-CM

## 2017-10-18 DIAGNOSIS — Z8781 Personal history of (healed) traumatic fracture: Secondary | ICD-10-CM | POA: Diagnosis not present

## 2017-10-18 DIAGNOSIS — I480 Paroxysmal atrial fibrillation: Secondary | ICD-10-CM | POA: Diagnosis not present

## 2017-10-18 DIAGNOSIS — Z9889 Other specified postprocedural states: Secondary | ICD-10-CM

## 2017-10-18 DIAGNOSIS — Z967 Presence of other bone and tendon implants: Secondary | ICD-10-CM | POA: Diagnosis not present

## 2017-10-18 NOTE — Patient Instructions (Signed)
Medication Instructions:  Your physician recommends that you continue on your current medications as directed. Please refer to the Current Medication list given to you today.  Labwork: None  Testing/Procedures: None  Follow-Up: Your physician recommends that you schedule a follow-up appointment in: 5 months  Any Other Special Instructions Will Be Listed Below (If Applicable).     If you need a refill on your cardiac medications before your next appointment, please call your pharmacy.   [place], RN, BSN

## 2017-10-18 NOTE — Progress Notes (Signed)
Cardiology Office Note:    Date:  10/18/2017   ID:  KHRISTOPHER KAPAUN, DOB [birth year], MRN [id number]  PCP:  [name], MD  Cardiologist:  Jenne Campus, MD    Referring MD: [name], MD   Chief Complaint  Patient presents with  . Follow-up  Doing well now  History of Present Illness:    Austin [name] is a 84 y.o. male with paroxysmal atrial fibrillation.  I saw him first time for evaluation before elective gallbladder surgery.  However shortly after the visit in my office he fell down he sustained fracture of his hip that required surgery he went to surgery with no difficulties and after that cholecystectomy has been done as a part of evaluation before surgery he had carotid ultrasounds which showed no significant stenosis also he got his nuclear stress test which showed normal left ventricular ejection fraction without evidence of ischemia.  Overall he is doing well he is not complaining of having any chest pain tightness squeezing pressure mentions no palpitations  Past Medical History:  Diagnosis Date  . ADENOCARCINOMA, PROSTATE, GLEASON GRADE 5 01/21/2009  . ALLERGIC RHINITIS 01/14/2007  . Bilateral inguinal hernia   . Cholecystitis   . Cholelithiasis   . CKD (chronic kidney disease) stage 3, GFR 30-59 ml/min (HCC) 04/30/2015  . COLONIC POLYPS, HX OF 01/14/2007  . ELEVATED PROSTATE SPECIFIC ANTIGEN 03/27/2008  . ESOPHAGITIS 01/14/2007  . GERD (gastroesophageal reflux disease)   . HYPERLIPIDEMIA 01/14/2007  . HYPERTENSION 01/14/2007  . LIBIDO, DECREASED 01/15/2007  . PAF (paroxysmal atrial fibrillation) ([place])   . Paralysis ([place])    from [place] 02/2013    Past Surgical History:  Procedure Laterality Date  . CHOLECYSTECTOMY N/A 08/17/2017   Procedure: LAPAROSCOPIC CHOLECYSTECTOMY WITH INTRAOPERATIVE CHOLANGIOGRAM ERAS PATHWAY;  Surgeon: Judeth Horn, MD;  Location: [place];  Service: General;  Laterality: N/A;  . ENDOSCOPIC RETROGRADE CHOLANGIOPANCREATOGRAPHY (ERCP)  WITH PROPOFOL N/A 08/18/2017   Procedure: ENDOSCOPIC RETROGRADE CHOLANGIOPANCREATOGRAPHY (ERCP) WITH PROPOFOL;  Surgeon: Doran Stabler, MD;  Location: [place];  Service: Endoscopy;  Laterality: N/A;  . ERCP N/A 08/21/2017   Procedure: ENDOSCOPIC RETROGRADE CHOLANGIOPANCREATOGRAPHY (ERCP);  Surgeon: Clarene Essex, MD;  Location: [place];  Service: Endoscopy;  Laterality: N/A;  . ESOPHAGOGASTRODUODENOSCOPY N/A 08/15/2013   Procedure: ESOPHAGOGASTRODUODENOSCOPY (EGD);  Surgeon: Gwenyth Ober, MD;  Location: [place];  Service: General;  Laterality: N/A;  . ESOPHAGOGASTRODUODENOSCOPY (EGD) WITH PROPOFOL N/A 08/18/2017   Procedure: ESOPHAGOGASTRODUODENOSCOPY (EGD) WITH PROPOFOL;  Surgeon: Doran Stabler, MD;  Location: [place];  Service: Endoscopy;  Laterality: N/A;  . HERNIA REPAIR     Bilateral Inguinal hernias  . HIP PINNING,CANNULATED Left 07/18/2017   Procedure: CANNULATED HIP PINNING;  Surgeon: Carole Civil, MD;  [facility];  Service: Orthopedics;  Laterality: Left;  . HIP SURGERY     Left  . IR EXCHANGE BILIARY DRAIN  05/24/2017  . IR EXCHANGE BILIARY DRAIN  07/27/2017  . IR PERC CHOLECYSTOSTOMY  04/10/2017  . JOINT REPLACEMENT    . PANCREATIC STENT PLACEMENT  08/21/2017   Procedure: PANCREATIC STENT PLACEMENT;  Surgeon: Clarene Essex, MD;  Location: [place];  Service: Endoscopy;;  . PEG PLACEMENT N/A 09/02/2013   Procedure: PERCUTANEOUS ENDOSCOPIC GASTROSTOMY (PEG) PLACEMENT;  Surgeon: Gwenyth Ober, MD;  Location: [place];  Service: General;  Laterality: N/A;  . PROSTATE CRYOABLATION    . REMOVAL OF STONES  08/21/2017   Procedure: REMOVAL OF STONES;  Surgeon: Clarene Essex, MD;  Location: [place] ENDOSCOPY;  Service: Endoscopy;;  . SPHINCTEROTOMY  08/21/2017   Procedure: SPHINCTEROTOMY;  Surgeon: Clarene Essex, MD;  Location: [place] ENDOSCOPY;  Service: Endoscopy;;    Current Medications: Current Meds  Medication Sig  . acetaminophen (TYLENOL) 325 MG tablet  Take 650 mg by mouth every 8 (eight) hours as needed for moderate pain.   . cholecalciferol (VITAMIN D) 1000 units tablet Take 2,000 Units by mouth daily.  . furosemide (LASIX) 20 MG tablet Take 20 mg by mouth daily.  [name] gabapentin (NEURONTIN) 100 MG capsule TAKE 2 TABLETS BY MOUTH TWICE DAILY.  [name] omeprazole (PRILOSEC) 40 MG capsule Take 40 mg by mouth daily.   . polyethylene glycol (MIRALAX / GLYCOLAX) packet Take 17 g by mouth daily.  . rosuvastatin (CRESTOR) 10 MG tablet TAKE ONE TABLET BY MOUTH ONCE DAILY.  [name] senna (SENOKOT) 8.6 MG TABS tablet Take 1 tablet by mouth 2 (two) times daily.  [name] warfarin (COUMADIN) 5 MG tablet Take 1.5 tablets (7.5 mg total) by mouth daily at 6 PM. Do not take next dose until tomorrow, August 23, 2017     Allergies:   Patient has no known allergies.   Social History   Socioeconomic History  . Marital status: Married    Spouse name: Not on file  . Number of children: 3  . Years of education: 60  . Highest education level: Not on file  Occupational History  . Occupation: Forensic scientist: [employer]  Social Needs  . Financial resource strain: Not on file  . Food insecurity:    Worry: Not on file    Inability: Not on file  . Transportation needs:    Medical: Not on file    Non-medical: Not on file  Tobacco Use  . Smoking status: Former Research scientist (life sciences)  . Smokeless tobacco: Never Used  Substance and Sexual Activity  . Alcohol use: Not Currently    Comment: rarely  . Drug use: No  . Sexual activity: Not Currently    Birth control/protection: None  Lifestyle  . Physical activity:    Days per week: Not on file    Minutes per session: Not on file  . Stress: Not on file  Relationships  . Social connections:    Talks on phone: Not on file    Gets together: Not on file    Attends religious service: Not on file    Active member of club or organization: Not on file    Attends meetings of clubs or organizations: Not on file     Relationship status: Not on file  Other Topics Concern  . Not on file  Social History Narrative   ** Merged History Encounter **       Building services engineer. Married 1961. 2 sons- '63, '69 & 1 daughter '55   Grandchildren 5. 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 Working full time. Discussion needed in regard to Advance Care Planning-DNR/DNI, no artificial feeding or hydration, No HD, no heroic or futile.                  Family History: The patient's family history includes Arthritis in his unknown relative; Hypertension in his other; Lung cancer in his father. ROS:   Please see the history of present illness.    All 14 point review of systems negative except as described per history of present illness  EKGs/Labs/Other Studies Reviewed:      Recent Labs: 04/09/2017: B Natriuretic Peptide 109.0 04/10/2017: Magnesium  1.5 09/14/2017: ALT 11; BUN 24; Creatinine, Ser 1.59; Hemoglobin 12.4; Platelets 194.0; Potassium 3.9; Sodium 143  Recent Lipid Panel    Component Value Date/Time   CHOL 165 05/18/2016 1411   TRIG 217.0 (H) 05/18/2016 1411   HDL 35.00 (L) 05/18/2016 1411   CHOLHDL 5 05/18/2016 1411   VLDL 43.4 (H) 05/18/2016 1411   LDLCALC 110 (H) 04/03/2013 1453   LDLDIRECT 84.0 05/18/2016 1411    Physical Exam:    VS:  BP 130/60   Pulse 84   Resp 12   Ht 5' 6.5" (1.689 m)   Wt 180 lb (81.6 kg) Comment: Reported  BMI 28.62 kg/m     Wt Readings from Last 3 Encounters:  10/18/17 180 lb (81.6 kg)  09/14/17 180 lb (81.6 kg)  09/04/17 180 lb (81.6 kg)     GEN:  Well nourished, well developed in no acute distress HEENT: Normal NECK: No JVD; No carotid bruits LYMPHATICS: No lymphadenopathy CARDIAC: RRR, no murmurs, no rubs, no gallops RESPIRATORY:  Clear to auscultation without rales, wheezing or rhonchi  ABDOMEN: Soft, non-tender, non-distended MUSCULOSKELETAL:  No edema; No deformity  SKIN: Warm and dry LOWER EXTREMITIES: no swelling NEUROLOGIC:  Alert and oriented x  3 PSYCHIATRIC:  Normal affect   ASSESSMENT:    1. Paroxysmal atrial fibrillation (HCC)   2. Essential hypertension   3. S/P ORIF (open reduction internal fixation) fracture left hip cannulated screw placement 07/18/17   4. Mixed hyperlipidemia    PLAN:    In order of problems listed above:  1. Paroxysmal atrial fibrillation he is anticoagulated Coumadin which I will continue.  Denies having any palpitations essential hypertension appears to be well controlled continue present management. 2. Mixed dyslipidemia recently his statin therapy has been reduced.  Will call primary care physician to get his fasting lipid profile 3. Status post open reduction internal fixation of his left hip doing very well.    Medication Adjustments/Labs and Tests Ordered: Current medicines are reviewed at length with the patient today.  Concerns regarding medicines are outlined above.  No orders of the defined types were placed in this encounter.  Medication changes: No orders of the defined types were placed in this encounter.   Signed, Park Liter, MD, [facility] 10/18/2017 11:59 AM    [place]

## 2017-10-19 DIAGNOSIS — I5020 Unspecified systolic (congestive) heart failure: Secondary | ICD-10-CM | POA: Diagnosis not present

## 2017-10-19 DIAGNOSIS — S72065D Nondisplaced articular fracture of head of left femur, subsequent encounter for closed fracture with routine healing: Secondary | ICD-10-CM | POA: Diagnosis not present

## 2017-10-19 DIAGNOSIS — I69354 Hemiplegia and hemiparesis following cerebral infarction affecting left non-dominant side: Secondary | ICD-10-CM | POA: Diagnosis not present

## 2017-10-19 DIAGNOSIS — N183 Chronic kidney disease, stage 3 (moderate): Secondary | ICD-10-CM | POA: Diagnosis not present

## 2017-10-19 DIAGNOSIS — I130 Hypertensive heart and chronic kidney disease with heart failure and stage 1 through stage 4 chronic kidney disease, or unspecified chronic kidney disease: Secondary | ICD-10-CM | POA: Diagnosis not present

## 2017-10-19 DIAGNOSIS — Z48815 Encounter for surgical aftercare following surgery on the digestive system: Secondary | ICD-10-CM | POA: Diagnosis not present

## 2017-10-22 ENCOUNTER — Other Ambulatory Visit: Payer: Self-pay

## 2017-10-22 NOTE — Patient Outreach (Signed)
[facility]) Care Management  10/22/2017  THALES KNIPPLE 11-Mar-1932 [id number]   Successful outreach with Mrs. Gater. She reported that Mr. Schubach was doing well and appeared to be very motivated during his Home Health therapy sessions. No falls since discharge. Reported that Mr. Mcnair was attempting to ambulate with his health aide at the time of the call. Requested home visit on 10/31/17.   PLAN Will follow up for home visit next week.   [place] [phone]

## 2017-10-23 DIAGNOSIS — N183 Chronic kidney disease, stage 3 (moderate): Secondary | ICD-10-CM | POA: Diagnosis not present

## 2017-10-23 DIAGNOSIS — I130 Hypertensive heart and chronic kidney disease with heart failure and stage 1 through stage 4 chronic kidney disease, or unspecified chronic kidney disease: Secondary | ICD-10-CM | POA: Diagnosis not present

## 2017-10-23 DIAGNOSIS — I5020 Unspecified systolic (congestive) heart failure: Secondary | ICD-10-CM | POA: Diagnosis not present

## 2017-10-23 DIAGNOSIS — Z48815 Encounter for surgical aftercare following surgery on the digestive system: Secondary | ICD-10-CM | POA: Diagnosis not present

## 2017-10-23 DIAGNOSIS — I69354 Hemiplegia and hemiparesis following cerebral infarction affecting left non-dominant side: Secondary | ICD-10-CM | POA: Diagnosis not present

## 2017-10-23 DIAGNOSIS — S72065D Nondisplaced articular fracture of head of left femur, subsequent encounter for closed fracture with routine healing: Secondary | ICD-10-CM | POA: Diagnosis not present

## 2017-10-25 DIAGNOSIS — I5020 Unspecified systolic (congestive) heart failure: Secondary | ICD-10-CM | POA: Diagnosis not present

## 2017-10-25 DIAGNOSIS — N183 Chronic kidney disease, stage 3 (moderate): Secondary | ICD-10-CM | POA: Diagnosis not present

## 2017-10-25 DIAGNOSIS — I129 Hypertensive chronic kidney disease with stage 1 through stage 4 chronic kidney disease, or unspecified chronic kidney disease: Secondary | ICD-10-CM | POA: Diagnosis not present

## 2017-10-25 DIAGNOSIS — D631 Anemia in chronic kidney disease: Secondary | ICD-10-CM | POA: Diagnosis not present

## 2017-10-25 DIAGNOSIS — S72065D Nondisplaced articular fracture of head of left femur, subsequent encounter for closed fracture with routine healing: Secondary | ICD-10-CM | POA: Diagnosis not present

## 2017-10-25 DIAGNOSIS — I130 Hypertensive heart and chronic kidney disease with heart failure and stage 1 through stage 4 chronic kidney disease, or unspecified chronic kidney disease: Secondary | ICD-10-CM | POA: Diagnosis not present

## 2017-10-25 DIAGNOSIS — N184 Chronic kidney disease, stage 4 (severe): Secondary | ICD-10-CM | POA: Diagnosis not present

## 2017-10-25 DIAGNOSIS — Z48815 Encounter for surgical aftercare following surgery on the digestive system: Secondary | ICD-10-CM | POA: Diagnosis not present

## 2017-10-25 DIAGNOSIS — N2581 Secondary hyperparathyroidism of renal origin: Secondary | ICD-10-CM | POA: Diagnosis not present

## 2017-10-25 DIAGNOSIS — I69354 Hemiplegia and hemiparesis following cerebral infarction affecting left non-dominant side: Secondary | ICD-10-CM | POA: Diagnosis not present

## 2017-10-26 DIAGNOSIS — I5020 Unspecified systolic (congestive) heart failure: Secondary | ICD-10-CM | POA: Diagnosis not present

## 2017-10-26 DIAGNOSIS — N183 Chronic kidney disease, stage 3 (moderate): Secondary | ICD-10-CM | POA: Diagnosis not present

## 2017-10-26 DIAGNOSIS — Z48815 Encounter for surgical aftercare following surgery on the digestive system: Secondary | ICD-10-CM | POA: Diagnosis not present

## 2017-10-26 DIAGNOSIS — I130 Hypertensive heart and chronic kidney disease with heart failure and stage 1 through stage 4 chronic kidney disease, or unspecified chronic kidney disease: Secondary | ICD-10-CM | POA: Diagnosis not present

## 2017-10-26 DIAGNOSIS — I69354 Hemiplegia and hemiparesis following cerebral infarction affecting left non-dominant side: Secondary | ICD-10-CM | POA: Diagnosis not present

## 2017-10-26 DIAGNOSIS — S72065D Nondisplaced articular fracture of head of left femur, subsequent encounter for closed fracture with routine healing: Secondary | ICD-10-CM | POA: Diagnosis not present

## 2017-10-29 DIAGNOSIS — I69354 Hemiplegia and hemiparesis following cerebral infarction affecting left non-dominant side: Secondary | ICD-10-CM | POA: Diagnosis not present

## 2017-10-29 DIAGNOSIS — N183 Chronic kidney disease, stage 3 (moderate): Secondary | ICD-10-CM | POA: Diagnosis not present

## 2017-10-29 DIAGNOSIS — S72065D Nondisplaced articular fracture of head of left femur, subsequent encounter for closed fracture with routine healing: Secondary | ICD-10-CM | POA: Diagnosis not present

## 2017-10-29 DIAGNOSIS — I5020 Unspecified systolic (congestive) heart failure: Secondary | ICD-10-CM | POA: Diagnosis not present

## 2017-10-29 DIAGNOSIS — I130 Hypertensive heart and chronic kidney disease with heart failure and stage 1 through stage 4 chronic kidney disease, or unspecified chronic kidney disease: Secondary | ICD-10-CM | POA: Diagnosis not present

## 2017-10-29 DIAGNOSIS — Z48815 Encounter for surgical aftercare following surgery on the digestive system: Secondary | ICD-10-CM | POA: Diagnosis not present

## 2017-10-30 ENCOUNTER — Ambulatory Visit (INDEPENDENT_AMBULATORY_CARE_PROVIDER_SITE_OTHER): Payer: Medicare Other | Admitting: *Deleted

## 2017-10-30 ENCOUNTER — Telehealth: Payer: Self-pay | Admitting: *Deleted

## 2017-10-30 DIAGNOSIS — Z5181 Encounter for therapeutic drug level monitoring: Secondary | ICD-10-CM | POA: Diagnosis not present

## 2017-10-30 DIAGNOSIS — I4891 Unspecified atrial fibrillation: Secondary | ICD-10-CM

## 2017-10-30 DIAGNOSIS — S72065D Nondisplaced articular fracture of head of left femur, subsequent encounter for closed fracture with routine healing: Secondary | ICD-10-CM | POA: Diagnosis not present

## 2017-10-30 DIAGNOSIS — I5020 Unspecified systolic (congestive) heart failure: Secondary | ICD-10-CM | POA: Diagnosis not present

## 2017-10-30 DIAGNOSIS — I130 Hypertensive heart and chronic kidney disease with heart failure and stage 1 through stage 4 chronic kidney disease, or unspecified chronic kidney disease: Secondary | ICD-10-CM | POA: Diagnosis not present

## 2017-10-30 DIAGNOSIS — Z48815 Encounter for surgical aftercare following surgery on the digestive system: Secondary | ICD-10-CM | POA: Diagnosis not present

## 2017-10-30 DIAGNOSIS — N183 Chronic kidney disease, stage 3 (moderate): Secondary | ICD-10-CM | POA: Diagnosis not present

## 2017-10-30 DIAGNOSIS — I69354 Hemiplegia and hemiparesis following cerebral infarction affecting left non-dominant side: Secondary | ICD-10-CM | POA: Diagnosis not present

## 2017-10-30 LAB — POCT INR: INR: 2.1 (ref 2.0–3.0)

## 2017-10-30 MED ORDER — WARFARIN SODIUM 5 MG PO TABS: ORAL_TABLET | ORAL | 6 refills | Status: DC

## 2017-10-30 NOTE — Patient Instructions (Signed)
Continue coumadin 1 1/2 tablets daily. Order given to [facility].  Pt D/C from [facility] today Recheck in 2 weeks in [place] office.  Appt made.

## 2017-10-30 NOTE — Telephone Encounter (Signed)
Done.  See coumadin note. 

## 2017-10-30 NOTE — Telephone Encounter (Signed)
Results today were  2.1    Austin [name] with Home care  [phone]

## 2017-10-31 ENCOUNTER — Other Ambulatory Visit: Payer: Self-pay

## 2017-10-31 NOTE — Patient Outreach (Signed)
Austin [name]) Care Management  10/31/2017  Austin [name] [birth year] [id number]    Call received from Austin [name] spouse Austin [name]. She requested to cancel the scheduled appointment due to the death of a close family friend. She reported that the family will be attending a funeral and will not be available. Informed that assessment could be performed telephonically. They were not able to complete telephonic assessment today but Austin [name] was agreeable to telephonic outreach within the next week.   PLAN Will follow up within one week.  [place] [phone]

## 2017-11-01 DIAGNOSIS — I69354 Hemiplegia and hemiparesis following cerebral infarction affecting left non-dominant side: Secondary | ICD-10-CM | POA: Diagnosis not present

## 2017-11-01 DIAGNOSIS — I130 Hypertensive heart and chronic kidney disease with heart failure and stage 1 through stage 4 chronic kidney disease, or unspecified chronic kidney disease: Secondary | ICD-10-CM | POA: Diagnosis not present

## 2017-11-01 DIAGNOSIS — Z48815 Encounter for surgical aftercare following surgery on the digestive system: Secondary | ICD-10-CM | POA: Diagnosis not present

## 2017-11-01 DIAGNOSIS — I5020 Unspecified systolic (congestive) heart failure: Secondary | ICD-10-CM | POA: Diagnosis not present

## 2017-11-01 DIAGNOSIS — S72065D Nondisplaced articular fracture of head of left femur, subsequent encounter for closed fracture with routine healing: Secondary | ICD-10-CM | POA: Diagnosis not present

## 2017-11-01 DIAGNOSIS — N183 Chronic kidney disease, stage 3 (moderate): Secondary | ICD-10-CM | POA: Diagnosis not present

## 2017-11-02 DIAGNOSIS — I5020 Unspecified systolic (congestive) heart failure: Secondary | ICD-10-CM | POA: Diagnosis not present

## 2017-11-02 DIAGNOSIS — Z48815 Encounter for surgical aftercare following surgery on the digestive system: Secondary | ICD-10-CM | POA: Diagnosis not present

## 2017-11-02 DIAGNOSIS — I130 Hypertensive heart and chronic kidney disease with heart failure and stage 1 through stage 4 chronic kidney disease, or unspecified chronic kidney disease: Secondary | ICD-10-CM | POA: Diagnosis not present

## 2017-11-02 DIAGNOSIS — N183 Chronic kidney disease, stage 3 (moderate): Secondary | ICD-10-CM | POA: Diagnosis not present

## 2017-11-02 DIAGNOSIS — S72065D Nondisplaced articular fracture of head of left femur, subsequent encounter for closed fracture with routine healing: Secondary | ICD-10-CM | POA: Diagnosis not present

## 2017-11-02 DIAGNOSIS — I69354 Hemiplegia and hemiparesis following cerebral infarction affecting left non-dominant side: Secondary | ICD-10-CM | POA: Diagnosis not present

## 2017-11-05 ENCOUNTER — Other Ambulatory Visit: Payer: Self-pay

## 2017-11-05 NOTE — Patient Outreach (Signed)
[place] [facility]) Care Management  11/05/2017  ANTOLIN BELSITO 1932-12-27 [id number]    Outreach call to Mr. Steil. Outreach unsuccessful. Left HIPAA compliant voice message requesting a return call.   PLAN Will follow up in 3-4 business days.   [place] [phone]

## 2017-11-06 DIAGNOSIS — I5020 Unspecified systolic (congestive) heart failure: Secondary | ICD-10-CM | POA: Diagnosis not present

## 2017-11-06 DIAGNOSIS — S72065D Nondisplaced articular fracture of head of left femur, subsequent encounter for closed fracture with routine healing: Secondary | ICD-10-CM | POA: Diagnosis not present

## 2017-11-06 DIAGNOSIS — Z48815 Encounter for surgical aftercare following surgery on the digestive system: Secondary | ICD-10-CM | POA: Diagnosis not present

## 2017-11-06 DIAGNOSIS — I130 Hypertensive heart and chronic kidney disease with heart failure and stage 1 through stage 4 chronic kidney disease, or unspecified chronic kidney disease: Secondary | ICD-10-CM | POA: Diagnosis not present

## 2017-11-06 DIAGNOSIS — N183 Chronic kidney disease, stage 3 (moderate): Secondary | ICD-10-CM | POA: Diagnosis not present

## 2017-11-06 DIAGNOSIS — I69354 Hemiplegia and hemiparesis following cerebral infarction affecting left non-dominant side: Secondary | ICD-10-CM | POA: Diagnosis not present

## 2017-11-08 ENCOUNTER — Ambulatory Visit: Payer: Self-pay

## 2017-11-08 DIAGNOSIS — S72065D Nondisplaced articular fracture of head of left femur, subsequent encounter for closed fracture with routine healing: Secondary | ICD-10-CM | POA: Diagnosis not present

## 2017-11-08 DIAGNOSIS — Z48815 Encounter for surgical aftercare following surgery on the digestive system: Secondary | ICD-10-CM | POA: Diagnosis not present

## 2017-11-08 DIAGNOSIS — I5020 Unspecified systolic (congestive) heart failure: Secondary | ICD-10-CM | POA: Diagnosis not present

## 2017-11-08 DIAGNOSIS — I130 Hypertensive heart and chronic kidney disease with heart failure and stage 1 through stage 4 chronic kidney disease, or unspecified chronic kidney disease: Secondary | ICD-10-CM | POA: Diagnosis not present

## 2017-11-08 DIAGNOSIS — N183 Chronic kidney disease, stage 3 (moderate): Secondary | ICD-10-CM | POA: Diagnosis not present

## 2017-11-08 DIAGNOSIS — I69354 Hemiplegia and hemiparesis following cerebral infarction affecting left non-dominant side: Secondary | ICD-10-CM | POA: Diagnosis not present

## 2017-11-09 ENCOUNTER — Ambulatory Visit: Payer: Self-pay

## 2017-11-09 ENCOUNTER — Other Ambulatory Visit: Payer: Self-pay

## 2017-11-09 NOTE — Patient Outreach (Addendum)
[name]) Care Management  11/09/2017  Austin [name] 04-03-1932 [id number]    [name] left a voice message regarding missed call on 11/05/17. RNCM placed additional outreach attempt on today.Left HIPAA compliant voice message requesting a return call.   PLAN Will follow up in 3-4 business days.   [place] [phone]

## 2017-11-11 NOTE — Patient Outreach (Signed)
[facility]) Care Management  10/02/2017  ANDERS HOHMANN 1933-01-14 [id number]    Call placed to member. No answer at listed number. Additional call placed to member's spouse [name]. She was unable to provide information at the time of the call due to being in a public area. Requested home visit for next week and reported that Mr. Keltner would be available on 10/10/17.   PLAN Will follow up on next week for home visit.   [place] [phone]

## 2017-11-13 ENCOUNTER — Other Ambulatory Visit: Payer: Self-pay | Admitting: Family Medicine

## 2017-11-14 ENCOUNTER — Ambulatory Visit (INDEPENDENT_AMBULATORY_CARE_PROVIDER_SITE_OTHER): Payer: Medicare Other | Admitting: *Deleted

## 2017-11-14 ENCOUNTER — Other Ambulatory Visit: Payer: Self-pay

## 2017-11-14 DIAGNOSIS — I480 Paroxysmal atrial fibrillation: Secondary | ICD-10-CM

## 2017-11-14 LAB — POCT INR: INR: 2.4 (ref 2.0–3.0)

## 2017-11-14 NOTE — Patient Outreach (Signed)
[facility]) Care Management  11/14/2017  Austin [name] Aug 05, 1932 [id number]    Unsuccessful outreach. Left HIPAA compliant voice message requesting a return call.   PLAN Will send unsuccessful outreach letter.  Will follow up in 3-4 business days.   [place] [phone]

## 2017-11-14 NOTE — Patient Instructions (Signed)
Continue coumadin 1 1/2 tablets daily. Recheck in 4 weeks in [place] office.

## 2017-11-15 ENCOUNTER — Other Ambulatory Visit: Payer: Self-pay | Admitting: Family Medicine

## 2017-11-15 NOTE — Telephone Encounter (Signed)
Refill denied. Dr. Tamala Julian has not prescribed gabapentin 370m.

## 2017-11-19 ENCOUNTER — Other Ambulatory Visit: Payer: Self-pay

## 2017-11-19 NOTE — Patient Outreach (Signed)
[name]) Care Management  11/19/2017  Austin [name] [birth year] [id number]    Follow up call placed to Austin [name] spouse Austin [name]. Member was unavailable for several weeks following discharge, however Austin [name] reported that he has progressed well. Stated that he is taking medications as prescribed, completing required lab work and attending MD appointments as scheduled. Hired sitters are available to assist in the home 7 days a week. Reported that he was discharged from [facility] services due to goals being met. He ambulates with hemi walker and requires assistance with sit-stand transfers.  No falls since discharge. Reported that Austin [name] is doing well and has been very motivated to return to his prior level of functioning. No urgent questions or concerns at time of call. Agreeable to case closure due to no [facility] Care Management needs. Agreed to notify PCP if Austin [name] health status changes and further [facility] outreach is needed.   PLAN Will complete case closure.   [place] [phone]

## 2017-11-23 ENCOUNTER — Other Ambulatory Visit: Payer: Self-pay | Admitting: Family Medicine

## 2017-12-01 ENCOUNTER — Other Ambulatory Visit: Payer: Self-pay | Admitting: Internal Medicine

## 2017-12-12 ENCOUNTER — Ambulatory Visit (INDEPENDENT_AMBULATORY_CARE_PROVIDER_SITE_OTHER): Payer: Medicare Other | Admitting: *Deleted

## 2017-12-12 DIAGNOSIS — I480 Paroxysmal atrial fibrillation: Secondary | ICD-10-CM

## 2017-12-12 DIAGNOSIS — Z5181 Encounter for therapeutic drug level monitoring: Secondary | ICD-10-CM

## 2017-12-12 LAB — POCT INR: INR: 2.7 (ref 2.0–3.0)

## 2017-12-12 NOTE — Patient Instructions (Signed)
Continue coumadin 1 1/2 tablets daily. Recheck in 6 weeks in [place] office.

## 2017-12-18 ENCOUNTER — Ambulatory Visit: Payer: Medicare Other | Admitting: Internal Medicine

## 2017-12-19 ENCOUNTER — Ambulatory Visit: Payer: Medicare Other | Admitting: Internal Medicine

## 2017-12-28 ENCOUNTER — Other Ambulatory Visit: Payer: Self-pay | Admitting: Family Medicine

## 2018-01-02 ENCOUNTER — Ambulatory Visit (INDEPENDENT_AMBULATORY_CARE_PROVIDER_SITE_OTHER): Payer: Medicare Other | Admitting: Internal Medicine

## 2018-01-02 ENCOUNTER — Encounter: Payer: Self-pay | Admitting: Internal Medicine

## 2018-01-02 VITALS — BP 112/64 | HR 73 | Temp 98.0°F | Ht 66.5 in

## 2018-01-02 DIAGNOSIS — M25562 Pain in left knee: Secondary | ICD-10-CM | POA: Insufficient documentation

## 2018-01-02 DIAGNOSIS — I10 Essential (primary) hypertension: Secondary | ICD-10-CM

## 2018-01-02 DIAGNOSIS — N183 Chronic kidney disease, stage 3 unspecified: Secondary | ICD-10-CM

## 2018-01-02 MED ORDER — DUODERM CGF DRESSING EX MISC: 1.0000 | Freq: Every day | CUTANEOUS | 3 refills | Status: DC | PRN

## 2018-01-02 MED ORDER — DICLOFENAC SODIUM 1 % TD GEL: 2.0000 g | Freq: Four times a day (QID) | TRANSDERMAL | 11 refills | Status: DC | PRN

## 2018-01-02 NOTE — Assessment & Plan Note (Signed)
With localized medial knee swelling, ? Meniscal tear?  For sport medicine for further eval, and volt gel prn

## 2018-01-02 NOTE — Patient Instructions (Addendum)
Please take all new medication as prescribed  - the Pennsaid samples you have at home, then the volt gel if needed  Please make appt for Dr Tamala Julian for the left knee as you leave today  Please take all new medication as prescribed  - the dressings sent to the pharmacy  Please continue all other medications as before, and refills have been done if requested.  Please have the pharmacy call with any other refills you may need.  Please continue your efforts at being more active, low cholesterol diet, and weight control  Please keep your appointments with your specialists as you may have planned  No further lab work needed today  Please return in 4 months, or sooner if needed

## 2018-01-02 NOTE — Assessment & Plan Note (Signed)
stable overall by history and exam, recent data reviewed with pt, and pt to continue medical treatment as before,  to f/u any worsening symptoms or concerns  

## 2018-01-02 NOTE — Progress Notes (Signed)
Subjective:    Patient ID: Austin [name], male    DOB: May 10, 1932, 84 y.o.   MRN: [id number]  HPI  Here to f/u; overall doing ok,  Pt denies chest pain, increasing sob or doe, wheezing, orthopnea, PND, increased LE swelling, palpitations, dizziness or syncope.  Pt denies new neurological symptoms such as new headache, or facial or extremity weakness or numbness.  Pt denies polydipsia, polyuria, or low sugar episode.  Pt states overall good compliance with meds, mostly tring to follow appropriate diet, with wt overall stable,  but little exercise however.  Left buttock discoloration area without breakdown, using a cream.  Has also had intermittent mild pain to the left groin area, similar area as involved in left femur fx repair s/p Internal ORIF. But starts at the distal upper anterior LLE worse to flex the knee.  Denies worsening reflux, abd pain, dysphagia, n/v, bowel change or blood, also doing well s/p ccx.  Can currently walk a few steps with walker at home, but overall less stamina since last finished PT.  Declines further PT, no falls. Denies worsening reflux, abd pain, dysphagia, n/v, bowel change or blood. Past Medical History:  Diagnosis Date  . ADENOCARCINOMA, PROSTATE, GLEASON GRADE 5 01/21/2009  . ALLERGIC RHINITIS 01/14/2007  . Bilateral inguinal hernia   . Cholecystitis   . Cholelithiasis   . CKD (chronic kidney disease) stage 3, GFR 30-59 ml/min (HCC) 04/30/2015  . COLONIC POLYPS, HX OF 01/14/2007  . ELEVATED PROSTATE SPECIFIC ANTIGEN 03/27/2008  . ESOPHAGITIS 01/14/2007  . GERD (gastroesophageal reflux disease)   . HYPERLIPIDEMIA 01/14/2007  . HYPERTENSION 01/14/2007  . LIBIDO, DECREASED 01/15/2007  . PAF (paroxysmal atrial fibrillation) ([place])   . Paralysis ([place])    from [place] 02/2013   Past Surgical History:  Procedure Laterality Date  . CHOLECYSTECTOMY N/A 08/17/2017   Procedure: LAPAROSCOPIC CHOLECYSTECTOMY WITH INTRAOPERATIVE CHOLANGIOGRAM ERAS PATHWAY;  Surgeon: Judeth Horn, MD;  Location: [place];  Service: General;  Laterality: N/A;  . ENDOSCOPIC RETROGRADE CHOLANGIOPANCREATOGRAPHY (ERCP) WITH PROPOFOL N/A 08/18/2017   Procedure: ENDOSCOPIC RETROGRADE CHOLANGIOPANCREATOGRAPHY (ERCP) WITH PROPOFOL;  Surgeon: Doran Stabler, MD;  Location: [place];  Service: Endoscopy;  Laterality: N/A;  . ERCP N/A 08/21/2017   Procedure: ENDOSCOPIC RETROGRADE CHOLANGIOPANCREATOGRAPHY (ERCP);  Surgeon: Clarene Essex, MD;  Location: [place];  Service: Endoscopy;  Laterality: N/A;  . ESOPHAGOGASTRODUODENOSCOPY N/A 08/15/2013   Procedure: ESOPHAGOGASTRODUODENOSCOPY (EGD);  Surgeon: Gwenyth Ober, MD;  Location: [place];  Service: General;  Laterality: N/A;  . ESOPHAGOGASTRODUODENOSCOPY (EGD) WITH PROPOFOL N/A 08/18/2017   Procedure: ESOPHAGOGASTRODUODENOSCOPY (EGD) WITH PROPOFOL;  Surgeon: Doran Stabler, MD;  Location: [place];  Service: Endoscopy;  Laterality: N/A;  . HERNIA REPAIR     Bilateral Inguinal hernias  . HIP PINNING,CANNULATED Left 07/18/2017   Procedure: CANNULATED HIP PINNING;  Surgeon: Carole Civil, MD;  Location: [facility];  Service: Orthopedics;  Laterality: Left;  . HIP SURGERY     Left  . IR EXCHANGE BILIARY DRAIN  05/24/2017  . IR EXCHANGE BILIARY DRAIN  07/27/2017  . IR PERC CHOLECYSTOSTOMY  04/10/2017  . JOINT REPLACEMENT    . PANCREATIC STENT PLACEMENT  08/21/2017   Procedure: PANCREATIC STENT PLACEMENT;  Surgeon: Clarene Essex, MD;  Location: [place];  Service: Endoscopy;;  . PEG PLACEMENT N/A 09/02/2013   Procedure: PERCUTANEOUS ENDOSCOPIC GASTROSTOMY (PEG) PLACEMENT;  Surgeon: Gwenyth Ober, MD;  Location: [place];  Service: General;  Laterality: N/A;  . PROSTATE CRYOABLATION    .  REMOVAL OF STONES  08/21/2017   Procedure: REMOVAL OF STONES;  Surgeon: Clarene Essex, MD;  Location: [facility] ENDOSCOPY;  Service: Endoscopy;;  . Joan Mayans  08/21/2017   Procedure: Joan Mayans;  Surgeon: Clarene Essex, MD;  Location: [place];   Service: Endoscopy;;    reports that he has quit smoking. He has never used smokeless tobacco. He reports that he drank alcohol. He reports that he does not use drugs. family history includes Arthritis in his unknown relative; Hypertension in his other; Lung cancer in his father. No Known Allergies Current Outpatient Medications on File Prior to Visit  Medication Sig Dispense Refill  . acetaminophen (TYLENOL) 325 MG tablet Take 650 mg by mouth every 8 (eight) hours as needed for moderate pain.     . cholecalciferol (VITAMIN D) 1000 units tablet Take 2,000 Units by mouth daily.    . furosemide (LASIX) 20 MG tablet Take 20 mg by mouth daily.    [name] gabapentin (NEURONTIN) 100 MG capsule TAKE 2 TABLETS BY MOUTH TWICE DAILY. 120 capsule 2  . gabapentin (NEURONTIN) 300 MG capsule TAKE 1 CAPSULE BY MOUTH AT BEDTIME. 30 capsule 0  . omeprazole (PRILOSEC) 40 MG capsule TAKE ONE CAPSULE BY MOUTH ONCE DAILY. 90 capsule 0  . polyethylene glycol (MIRALAX / GLYCOLAX) packet Take 17 g by mouth daily.    . rosuvastatin (CRESTOR) 10 MG tablet TAKE ONE TABLET BY MOUTH ONCE DAILY. 30 tablet 2  . senna (SENOKOT) 8.6 MG TABS tablet Take 1 tablet by mouth 2 (two) times daily.    [name] warfarin (COUMADIN) 5 MG tablet Take 1 1/2 tablets daily or as directed 45 tablet 6   No current facility-administered medications on file prior to visit.    Review of Systems  Constitutional: Negative for other unusual diaphoresis or sweats HENT: Negative for ear discharge or swelling Eyes: Negative for other worsening visual disturbances Respiratory: Negative for stridor or other swelling  Gastrointestinal: Negative for worsening distension or other blood Genitourinary: Negative for retention or other urinary change Musculoskeletal: Negative for other MSK pain or swelling Skin: Negative for color change or other new lesions Neurological: Negative for worsening tremors and other numbness  Psychiatric/Behavioral: Negative for  worsening agitation or other fatigue All other system neg per pt    Objective:   Physical Exam BP 112/64   Pulse 73   Temp 98 F (36.7 C) (Oral)   Ht 5' 6.5" (1.689 m)   SpO2 96%   BMI 28.62 kg/m  VS noted,  Constitutional: Pt appears in NAD HENT: Head: NCAT.  Right Ear: External ear normal.  Left Ear: External ear normal.  Eyes: . Pupils are equal, round, and reactive to light. Conjunctivae and EOM are normal Nose: without d/c or deformity Neck: Neck supple. Gross normal ROM Cardiovascular: Normal rate and regular rhythm.   Pulmonary/Chest: Effort normal and breath sounds without rales or wheezing.  Abd:  Soft, NT, ND, + BS, no organomegaly LLE with mild medial knee with localized swelling Neurological: [name] alert. At baseline orientation, motor grossly intact Skin: Skin is warm. No rashes, other new lesions, trace LLE edema Psychiatric: Pt behavior is normal without agitation  Left leg brace and trace swelling Lab Results  Component Value Date   WBC 6.3 09/14/2017   HGB 12.4 (L) 09/14/2017   HCT 36.9 (L) 09/14/2017   PLT 194.0 09/14/2017   GLUCOSE 96 09/14/2017   CHOL 165 05/18/2016   TRIG 217.0 (H) 05/18/2016   HDL 35.00 (L) 05/18/2016  LDLDIRECT 84.0 05/18/2016   LDLCALC 110 (H) 04/03/2013   ALT 11 09/14/2017   AST 12 09/14/2017   NA 143 09/14/2017   K 3.9 09/14/2017   CL 108 09/14/2017   CREATININE 1.59 (H) 09/14/2017   BUN 24 (H) 09/14/2017   CO2 29 09/14/2017   TSH 1.73 04/30/2015   PSA 7.27 (H) 04/14/2008   INR 2.7 12/12/2017   HGBA1C 6.0 (H) 09/04/2013       Assessment & Plan:

## 2018-01-28 ENCOUNTER — Ambulatory Visit (INDEPENDENT_AMBULATORY_CARE_PROVIDER_SITE_OTHER): Payer: Medicare Other | Admitting: Pharmacist

## 2018-01-28 DIAGNOSIS — I480 Paroxysmal atrial fibrillation: Secondary | ICD-10-CM | POA: Diagnosis not present

## 2018-01-28 LAB — POCT INR: INR: 2.8 (ref 2.0–3.0)

## 2018-01-28 NOTE — Patient Instructions (Signed)
Description   Continue coumadin 1 1/2 tablets daily. Recheck in 6 weeks in [place] office.

## 2018-01-30 NOTE — Progress Notes (Signed)
[name] [place],  [address] Phone: [phone] Subjective:   Austin [name], am serving as a scribe for Dr. Hulan Saas.   CC: Left knee pain  ERX:[id number]  Austin [name] is a 85 y.o. male coming in with complaint of back pain and left hip pain. Pain radiates down the left leg to the top of his foot. Has been having pain for 4 weeks. Pain gets worse with standing on leg and when lying down in bed. Fell in July and broke his hip. Had surgery. Has been having intermittent swelling in medial thigh since fall. Continues to use gabapentin daily.  Patient states it seems to be focused around the left knee more than usual.  Patient states it is severe.     Past Medical History:  Diagnosis Date  . ADENOCARCINOMA, PROSTATE, GLEASON GRADE 5 01/21/2009  . ALLERGIC RHINITIS 01/14/2007  . Bilateral inguinal hernia   . Cholecystitis   . Cholelithiasis   . CKD (chronic kidney disease) stage 3, GFR 30-59 ml/min (HCC) 04/30/2015  . COLONIC POLYPS, HX OF 01/14/2007  . ELEVATED PROSTATE SPECIFIC ANTIGEN 03/27/2008  . ESOPHAGITIS 01/14/2007  . GERD (gastroesophageal reflux disease)   . HYPERLIPIDEMIA 01/14/2007  . HYPERTENSION 01/14/2007  . LIBIDO, DECREASED 01/15/2007  . PAF (paroxysmal atrial fibrillation) ([place])   . Paralysis ([place])    from [place] 02/2013   Past Surgical History:  Procedure Laterality Date  . CHOLECYSTECTOMY N/A 08/17/2017   Procedure: LAPAROSCOPIC CHOLECYSTECTOMY WITH INTRAOPERATIVE CHOLANGIOGRAM ERAS PATHWAY;  Surgeon: Judeth Horn, MD;  Location: [place];  Service: General;  Laterality: N/A;  . ENDOSCOPIC RETROGRADE CHOLANGIOPANCREATOGRAPHY (ERCP) WITH PROPOFOL N/A 08/18/2017   Procedure: ENDOSCOPIC RETROGRADE CHOLANGIOPANCREATOGRAPHY (ERCP) WITH PROPOFOL;  Surgeon: Doran Stabler, MD;  Location: [employer];  Service: Endoscopy;  Laterality: N/A;  . ERCP N/A 08/21/2017   Procedure: ENDOSCOPIC RETROGRADE  CHOLANGIOPANCREATOGRAPHY (ERCP);  Surgeon: Clarene Essex, MD;  Location: [place];  Service: Endoscopy;  Laterality: N/A;  . ESOPHAGOGASTRODUODENOSCOPY N/A 08/15/2013   Procedure: ESOPHAGOGASTRODUODENOSCOPY (EGD);  Surgeon: Gwenyth Ober, MD;  Location: [place];  Service: General;  Laterality: N/A;  . ESOPHAGOGASTRODUODENOSCOPY (EGD) WITH PROPOFOL N/A 08/18/2017   Procedure: ESOPHAGOGASTRODUODENOSCOPY (EGD) WITH PROPOFOL;  Surgeon: Doran Stabler, MD;  Location: [place];  Service: Endoscopy;  Laterality: N/A;  . HERNIA REPAIR     Bilateral Inguinal hernias  . HIP PINNING,CANNULATED Left 07/18/2017   Procedure: CANNULATED HIP PINNING;  Surgeon: Carole Civil, MD;  Location: [facility];  Service: Orthopedics;  Laterality: Left;  . HIP SURGERY     Left  . IR EXCHANGE BILIARY DRAIN  05/24/2017  . IR EXCHANGE BILIARY DRAIN  07/27/2017  . IR PERC CHOLECYSTOSTOMY  04/10/2017  . JOINT REPLACEMENT    . PANCREATIC STENT PLACEMENT  08/21/2017   Procedure: PANCREATIC STENT PLACEMENT;  Surgeon: Clarene Essex, MD;  Location: [place];  Service: Endoscopy;;  . PEG PLACEMENT N/A 09/02/2013   Procedure: PERCUTANEOUS ENDOSCOPIC GASTROSTOMY (PEG) PLACEMENT;  Surgeon: Gwenyth Ober, MD;  Location: [place];  Service: General;  Laterality: N/A;  . PROSTATE CRYOABLATION    . REMOVAL OF STONES  08/21/2017   Procedure: REMOVAL OF STONES;  Surgeon: Clarene Essex, MD;  Location: [facility] ENDOSCOPY;  Service: Endoscopy;;  . Joan Mayans  08/21/2017   Procedure: Joan Mayans;  Surgeon: Clarene Essex, MD;  Location: [facility] ENDOSCOPY;  Service: Endoscopy;;   Social History   Socioeconomic History  . Marital status: Married  Spouse name: Not on file  . Number of children: 3  . Years of education: 59  . Highest education level: Not on file  Occupational History  . Occupation: Forensic scientist: [employer]  Social Needs  . Financial resource strain: Not on file  . Food insecurity:     Worry: Not on file    Inability: Not on file  . Transportation needs:    Medical: Not on file    Non-medical: Not on file  Tobacco Use  . Smoking status: Former Research scientist (life sciences)  . Smokeless tobacco: Never Used  Substance and Sexual Activity  . Alcohol use: Not Currently    Comment: rarely  . Drug use: [name]  . Sexual activity: Not Currently    Birth control/protection: None  Lifestyle  . Physical activity:    Days per week: Not on file    Minutes per session: Not on file  . Stress: Not on file  Relationships  . Social connections:    Talks on phone: Not on file    Gets together: Not on file    Attends religious service: Not on file    Active member of club or organization: Not on file    Attends meetings of clubs or organizations: Not on file    Relationship status: Not on file  Other Topics Concern  . Not on file  Social History Narrative   ** Merged History Encounter **       Building services engineer. Married 1961. 2 sons- '63, '69 & 1 daughter '55   Grandchildren 5. 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 Working full time. Discussion needed in regard to Advance Care Planning-DNR/DNI, [name] artificial feeding or hydration, [name] HD, [name] heroic or futile.                [name] Known Allergies Family History  Problem Relation Age of Onset  . Lung cancer Father   . Hypertension Other   . Arthritis Unknown      Current Outpatient Medications (Cardiovascular):  .  furosemide (LASIX) 20 MG tablet, Take 20 mg by mouth daily. .  rosuvastatin (CRESTOR) 10 MG tablet, TAKE ONE TABLET BY MOUTH ONCE DAILY.   Current Outpatient Medications (Analgesics):  .  acetaminophen (TYLENOL) 325 MG tablet, Take 650 mg by mouth every 8 (eight) hours as needed for moderate pain.   Current Outpatient Medications (Hematological):  .  warfarin (COUMADIN) 5 MG tablet, Take 1 1/2 tablets daily or as directed  Current Outpatient Medications (Other):  .  cholecalciferol (VITAMIN D) 1000 units tablet, Take 2,000 Units by mouth  daily. .  Control Gel Formula Dressing (DUODERM CGF DRESSING) MISC, Apply 1 each topically daily as needed. .  diclofenac sodium (VOLTAREN) 1 % GEL, Apply 2 g topically 4 (four) times daily as needed. .  gabapentin (NEURONTIN) 100 MG capsule, TAKE 2 TABLETS BY MOUTH TWICE DAILY. [name]  gabapentin (NEURONTIN) 300 MG capsule, Take 1 capsule (300 mg total) by mouth at bedtime. [name]  omeprazole (PRILOSEC) 40 MG capsule, TAKE ONE CAPSULE BY MOUTH ONCE DAILY.    Past medical history, social, surgical and family history all reviewed in electronic medical record.  [name] pertanent information unless stated regarding to the chief complaint.   Review of Systems:  [name] headache, visual changes, nausea, vomiting, diarrhea, constipation, dizziness, abdominal pain, skin rash, fevers, chills, night sweats, weight loss, swollen lymph nodes, , chest pain, shortness of breath, mood changes.  Positive muscle aches and body aches  Objective  Blood pressure (!) 104/52, pulse 71, height 5\' 6"  (1.676 m), SpO2 99 %.    General: [name] apparent distress alert 3 mood and affect normal, dressed appropriately.  HEENT: Pupils equal, extraocular movements intact  Respiratory: Patient's speak in full sentences and does not appear short of breath  Cardiovascular: Trace lower extremity edema, non tender, [name] erythema  Skin: Warm dry intact with [name] signs of infection or rash on extremities or on axial skeleton.  Abdomen: Soft  Neuro: Cranial nerves II through XII are intact, neurovascularly intact in all extremities with 2+ DTRs and 2+ pulses.  Lymph: [name] lymphadenopathy of posterior or anterior cervical chain or axillae bilaterally.  Gait patient is wheelchair-bound MSK: Weakness in the left lower extremity noted Knee: Left valgus deformity noted.  Abnormal thigh to calf ratio.  Atrophy secondary to muscle weakness from post stroke Tender to palpation over medial and PF joint line.  ROM full passively significant weakness with foot drop  of this leg instability with valgus force.  As well as with varus force painful patellar compression. Patellar glide with moderate crepitus. Patellar and quadriceps tendons unremarkable. Hamstring and quadriceps strength is normal. Contralateral knee shows full range of motion and nontender  After informed written and verbal consent, patient was seated on exam table. Left knee was prepped with alcohol swab and utilizing anterolateral approach, patient's left knee space was injected with 4:1  marcaine 0.5%: Kenalog 40mg /dL. Patient tolerated the procedure well without immediate complications.   Impression and Recommendations:     This case required medical decision making of moderate complexity. The above documentation has been reviewed and is accurate and complete Lyndal Pulley, DO       Note: This dictation was prepared with Dragon dictation along with smaller phrase technology. Any transcriptional errors that result from this process are unintentional.

## 2018-02-01 ENCOUNTER — Encounter: Payer: Self-pay | Admitting: Family Medicine

## 2018-02-01 ENCOUNTER — Ambulatory Visit (INDEPENDENT_AMBULATORY_CARE_PROVIDER_SITE_OTHER): Payer: Medicare Other | Admitting: Family Medicine

## 2018-02-01 DIAGNOSIS — M1712 Unilateral primary osteoarthritis, left knee: Secondary | ICD-10-CM | POA: Diagnosis not present

## 2018-02-01 MED ORDER — GABAPENTIN 100 MG PO CAPS: ORAL_CAPSULE | ORAL | 3 refills | Status: DC

## 2018-02-01 MED ORDER — GABAPENTIN 300 MG PO CAPS: 300.0000 mg | ORAL_CAPSULE | Freq: Every day | ORAL | 3 refills | Status: DC

## 2018-02-01 NOTE — Patient Instructions (Signed)
Good to see you  Ice is your friend  Try the pennsaid with some type of lotion 2 times a day- if rash happens again please stop See me again in 4ish weeks and if still in pain we will do another injection

## 2018-02-01 NOTE — Assessment & Plan Note (Signed)
Patient given injection today.  Tolerated the procedure well.  Discussed with patient and icing regimen and home exercise.  Patient will continue on the gabapentin in case the differential with the lumbar radiculopathy.  Discussed icing regimen.  Could consider custom bracing but with patient having an AFO of the lower extremity will be difficult to fit.  Patient could be candidate for [place] supplementation and will discuss at follow-up in 4 weeks

## 2018-02-13 ENCOUNTER — Other Ambulatory Visit: Payer: Self-pay | Admitting: Internal Medicine

## 2018-03-02 NOTE — Progress Notes (Signed)
[name] [place] [place], [place] [address] Phone: [phone] Subjective:     CC: Left knee pain  SHN:[id number]   02/01/2018:  Patient given injection today.  Tolerated the procedure well.  Discussed with patient and icing regimen and home exercise.  Patient will continue on the gabapentin in case the differential with the lumbar radiculopathy.  Discussed icing regimen.  Could consider custom bracing but with patient having an AFO of the lower extremity will be difficult to fit.  Patient could be candidate for Visco supplementation and will discuss at follow-up in 4 weeks  Update 03/04/2018:  Austin [name] is a 85 y.o. male coming in with complaint of left knee pain. Patient states the the injection that he was given previously.  Did help some.  Only approximately 4 days of pain relief and pain started coming back.    Past Medical History:  Diagnosis Date  . ADENOCARCINOMA, PROSTATE, GLEASON GRADE 5 01/21/2009  . ALLERGIC RHINITIS 01/14/2007  . Bilateral inguinal hernia   . Cholecystitis   . Cholelithiasis   . CKD (chronic kidney disease) stage 3, GFR 30-59 ml/min (HCC) 04/30/2015  . COLONIC POLYPS, HX OF 01/14/2007  . ELEVATED PROSTATE SPECIFIC ANTIGEN 03/27/2008  . ESOPHAGITIS 01/14/2007  . GERD (gastroesophageal reflux disease)   . HYPERLIPIDEMIA 01/14/2007  . HYPERTENSION 01/14/2007  . LIBIDO, DECREASED 01/15/2007  . PAF (paroxysmal atrial fibrillation) ([place])   . Paralysis ([place])    from [place] 02/2013   Past Surgical History:  Procedure Laterality Date  . CHOLECYSTECTOMY N/A 08/17/2017   Procedure: LAPAROSCOPIC CHOLECYSTECTOMY WITH INTRAOPERATIVE CHOLANGIOGRAM ERAS PATHWAY;  Surgeon: Judeth Horn, MD;  Location: [facility];  Service: General;  Laterality: N/A;  . ENDOSCOPIC RETROGRADE CHOLANGIOPANCREATOGRAPHY (ERCP) WITH PROPOFOL N/A 08/18/2017   Procedure: ENDOSCOPIC RETROGRADE CHOLANGIOPANCREATOGRAPHY (ERCP) WITH PROPOFOL;  Surgeon: Doran Stabler, MD;  Location: [facility];  Service: Endoscopy;  Laterality: N/A;  . ERCP N/A 08/21/2017   Procedure: ENDOSCOPIC RETROGRADE CHOLANGIOPANCREATOGRAPHY (ERCP);  Surgeon: Clarene Essex, MD;  Location: [place];  Service: Endoscopy;  Laterality: N/A;  . ESOPHAGOGASTRODUODENOSCOPY N/A 08/15/2013   Procedure: ESOPHAGOGASTRODUODENOSCOPY (EGD);  Surgeon: Gwenyth Ober, MD;  Location: [facility];  Service: General;  Laterality: N/A;  . ESOPHAGOGASTRODUODENOSCOPY (EGD) WITH PROPOFOL N/A 08/18/2017   Procedure: ESOPHAGOGASTRODUODENOSCOPY (EGD) WITH PROPOFOL;  Surgeon: Doran Stabler, MD;  Location: [facility];  Service: Endoscopy;  Laterality: N/A;  . HERNIA REPAIR     Bilateral Inguinal hernias  . HIP PINNING,CANNULATED Left 07/18/2017   Procedure: CANNULATED HIP PINNING;  Surgeon: Carole Civil, MD;  Location: [facility];  Service: Orthopedics;  Laterality: Left;  . HIP SURGERY     Left  . IR EXCHANGE BILIARY DRAIN  05/24/2017  . IR EXCHANGE BILIARY DRAIN  07/27/2017  . IR PERC CHOLECYSTOSTOMY  04/10/2017  . JOINT REPLACEMENT    . PANCREATIC STENT PLACEMENT  08/21/2017   Procedure: PANCREATIC STENT PLACEMENT;  Surgeon: Clarene Essex, MD;  Location: [facility];  Service: Endoscopy;;  . PEG PLACEMENT N/A 09/02/2013   Procedure: PERCUTANEOUS ENDOSCOPIC GASTROSTOMY (PEG) PLACEMENT;  Surgeon: Gwenyth Ober, MD;  Location: [facility];  Service: General;  Laterality: N/A;  . PROSTATE CRYOABLATION    . REMOVAL OF STONES  08/21/2017   Procedure: REMOVAL OF STONES;  Surgeon: Clarene Essex, MD;  Location: [facility] ENDOSCOPY;  Service: Endoscopy;;  . Joan Mayans  08/21/2017   Procedure: Joan Mayans;  Surgeon: Clarene Essex, MD;  Location: [facility] [place];  Service: Endoscopy;;   Social History   Socioeconomic History  . Marital status: Married    Spouse name: Not on file  . Number of children: 3  . Years of education: 62  . Highest education level: Not on file  Occupational History  . Occupation:  Forensic scientist: [employer]  Social Needs  . Financial resource strain: Not on file  . Food insecurity:    Worry: Not on file    Inability: Not on file  . Transportation needs:    Medical: Not on file    Non-medical: Not on file  Tobacco Use  . Smoking status: Former Research scientist (life sciences)  . Smokeless tobacco: Never Used  Substance and Sexual Activity  . Alcohol use: Not Currently    Comment: rarely  . Drug use: No  . Sexual activity: Not Currently    Birth control/protection: None  Lifestyle  . Physical activity:    Days per week: Not on file    Minutes per session: Not on file  . Stress: Not on file  Relationships  . Social connections:    Talks on phone: Not on file    Gets together: Not on file    Attends religious service: Not on file    Active member of club or organization: Not on file    Attends meetings of clubs or organizations: Not on file    Relationship status: Not on file  Other Topics Concern  . Not on file  Social History Narrative   ** Merged History Encounter **       Building services engineer. Married 1961. 2 sons- '63, '69 & 1 daughter '55   Grandchildren 5. 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 Working full time. Discussion needed in regard to Advance Care Planning-DNR/DNI, no artificial feeding or hydration, No HD, no heroic or futile.                No Known Allergies Family History  Problem Relation Age of Onset  . Lung cancer Father   . Hypertension Other   . Arthritis Unknown      Current Outpatient Medications (Cardiovascular):  .  furosemide (LASIX) 20 MG tablet, Take 20 mg by mouth daily. .  rosuvastatin (CRESTOR) 10 MG tablet, TAKE ONE TABLET BY MOUTH ONCE DAILY.   Current Outpatient Medications (Analgesics):  .  acetaminophen (TYLENOL) 325 MG tablet, Take 650 mg by mouth every 8 (eight) hours as needed for moderate pain.   Current Outpatient Medications (Hematological):  .  warfarin (COUMADIN) 5 MG tablet, Take 1 1/2 tablets daily or  as directed  Current Outpatient Medications (Other):  .  cholecalciferol (VITAMIN D) 1000 units tablet, Take 2,000 Units by mouth daily. .  Control Gel Formula Dressing (DUODERM CGF DRESSING) MISC, Apply 1 each topically daily as needed. .  diclofenac sodium (VOLTAREN) 1 % GEL, Apply 2 g topically 4 (four) times daily as needed. .  gabapentin (NEURONTIN) 100 MG capsule, TAKE 2 TABLETS BY MOUTH TWICE DAILY. [name]  gabapentin (NEURONTIN) 300 MG capsule, Take 1 capsule (300 mg total) by mouth at bedtime. [name]  omeprazole (PRILOSEC) 40 MG capsule, TAKE ONE CAPSULE BY MOUTH ONCE DAILY.    Past medical history, social, surgical and family history all reviewed in electronic medical record.  No pertanent information unless stated regarding to the chief complaint.   Review of Systems:  No headache, visual changes, nausea, vomiting, diarrhea, constipation, dizziness, abdominal pain, skin rash, fevers, chills, night sweats, weight loss, swollen  lymph nodes,, chest pain, shortness of breath, mood changes.  Positive muscle aches, joint swelling, body aches  Objective  Blood pressure 136/74, pulse 69, height 5' 6"  (1.676 m), weight 180 lb (81.6 kg), SpO2 98 %.    General: No apparent distress alert and oriented x3 mood and affect normal, dressed appropriately.  HEENT: Pupils equal, extraocular movements intact  Respiratory: Patient's speak in full sentences and does not appear short of breath  Cardiovascular: Trace lower extremity edema, non tender, no erythema  Skin: Warm dry intact with no signs of infection or rash on extremities or on axial skeleton.  Abdomen: Soft nontender  Neuro: Cranial nerves II through XII are intact, neurovascularly intact in all extremities with 2+ DTRs and 2+ pulses.  Lymph: No lymphadenopathy of posterior or anterior cervical chain or axillae bilaterally.  Gait patient is in a wheelchair.  MSK: Left knee exam shows Patient does have arthritic changes with a valgus deformity.   No atrophy of the legs noted worse on the left secondary to patient's stroke.  Tender to palpation over the medial joint line.  Range of motion passively significant weakness though is noted with a foot drop on the left leg.  Significant instability of the knee with moderate crepitus contralateral knee shows full range of motion and is nontender  After informed written and verbal consent, patient was seated on exam table. Right knee was prepped with alcohol swab and utilizing anterolateral approach, patient's right knee space was injected with 22 mg/mL of Monovisc (sodium hyaluronate) in a prefilled syringe was injected easily into the knee through a 22-gauge needle..Patient tolerated the procedure well without immediate complications.   Impression and Recommendations:     . The above documentation has been reviewed and is accurate and complete Lyndal Pulley, DO       Note: This dictation was prepared with Dragon dictation along with smaller phrase technology. Any transcriptional errors that result from this process are unintentional.

## 2018-03-04 ENCOUNTER — Encounter: Payer: Self-pay | Admitting: Family Medicine

## 2018-03-04 ENCOUNTER — Ambulatory Visit (INDEPENDENT_AMBULATORY_CARE_PROVIDER_SITE_OTHER): Payer: Medicare Other | Admitting: Family Medicine

## 2018-03-04 DIAGNOSIS — M1712 Unilateral primary osteoarthritis, left knee: Secondary | ICD-10-CM | POA: Diagnosis not present

## 2018-03-04 NOTE — Assessment & Plan Note (Signed)
Patient given injection in the knee.  Tolerated procedure well.  Patient is doing better with wearing a brace for his foot drop.  Discussed with patient that it may take weeks to start working.  Hopefully will last months.  Follow-up again in 6 weeks.

## 2018-03-04 NOTE — Patient Instructions (Signed)
God to see you  I hope this gives you some relief and works for sometime I also think it will be 4 weeks until you notice some effect  See me again in 6 weeks

## 2018-03-11 ENCOUNTER — Ambulatory Visit (INDEPENDENT_AMBULATORY_CARE_PROVIDER_SITE_OTHER): Payer: Medicare Other | Admitting: Pharmacist

## 2018-03-11 DIAGNOSIS — I480 Paroxysmal atrial fibrillation: Secondary | ICD-10-CM | POA: Diagnosis not present

## 2018-03-11 LAB — POCT INR: INR: 3.2 — AB (ref 2.0–3.0)

## 2018-03-11 NOTE — Patient Instructions (Signed)
Description   Take only 1/2 tablet today then continue coumadin 1 1/2 tablets daily. Recheck in 6 weeks in [place] office.

## 2018-03-18 ENCOUNTER — Telehealth: Payer: Self-pay | Admitting: Physical Medicine & Rehabilitation

## 2018-03-28 ENCOUNTER — Other Ambulatory Visit: Payer: Self-pay | Admitting: Internal Medicine

## 2018-04-15 ENCOUNTER — Ambulatory Visit: Payer: Medicare Other | Admitting: Family Medicine

## 2018-04-16 ENCOUNTER — Other Ambulatory Visit: Payer: Self-pay | Admitting: Internal Medicine

## 2018-04-19 ENCOUNTER — Telehealth: Payer: Self-pay | Admitting: *Deleted

## 2018-04-19 NOTE — Telephone Encounter (Signed)
° °  COVID-19 Pre-Screening Questions:   Do you currently have a fever?  (yes = cancel and refer to pcp for e-visit)  NO  Have you recently travelled on a cruise, internationally, or to [place], [place], [place], [place], [place], or [place], [place] [employer]) ? yes = cancel, stay home, monitor symptoms, and contact pcp or initiate e-visit if symptoms develop)  NO  Have you been in contact with someone that is currently pending confirmation of Covid19 testing or has been confirmed to have the Covid19 virus?  (yes = cancel, stay home, away from tested individual, monitor symptoms, and contact pcp or initiate e-visit if symptoms develop)   NO  Are you currently experiencing fatigue or cough?  (yes = pt should be prepared to have a mask placed at the time of their visit).  NO

## 2018-04-22 ENCOUNTER — Other Ambulatory Visit: Payer: Self-pay

## 2018-04-22 ENCOUNTER — Ambulatory Visit (INDEPENDENT_AMBULATORY_CARE_PROVIDER_SITE_OTHER): Payer: Medicare Other | Admitting: *Deleted

## 2018-04-22 DIAGNOSIS — I480 Paroxysmal atrial fibrillation: Secondary | ICD-10-CM | POA: Diagnosis not present

## 2018-04-22 DIAGNOSIS — Z5181 Encounter for therapeutic drug level monitoring: Secondary | ICD-10-CM | POA: Diagnosis not present

## 2018-04-22 LAB — POCT INR: INR: 2.9 (ref 2.0–3.0)

## 2018-04-22 NOTE — Patient Instructions (Signed)
Continue coumadin 1 1/2 tablets daily. Recheck in 6 weeks in [place] office.   

## 2018-05-02 ENCOUNTER — Ambulatory Visit: Payer: Medicare Other | Admitting: Internal Medicine

## 2018-05-10 ENCOUNTER — Ambulatory Visit: Payer: Medicare Other | Admitting: Family Medicine

## 2018-05-15 DIAGNOSIS — N184 Chronic kidney disease, stage 4 (severe): Secondary | ICD-10-CM | POA: Diagnosis not present

## 2018-05-27 ENCOUNTER — Ambulatory Visit: Payer: Medicare Other | Admitting: Family Medicine

## 2018-06-03 ENCOUNTER — Ambulatory Visit (INDEPENDENT_AMBULATORY_CARE_PROVIDER_SITE_OTHER): Payer: Medicare Other | Admitting: *Deleted

## 2018-06-03 ENCOUNTER — Other Ambulatory Visit: Payer: Self-pay

## 2018-06-03 DIAGNOSIS — Z5181 Encounter for therapeutic drug level monitoring: Secondary | ICD-10-CM | POA: Diagnosis not present

## 2018-06-03 DIAGNOSIS — I480 Paroxysmal atrial fibrillation: Secondary | ICD-10-CM

## 2018-06-03 LAB — POCT INR: INR: 2.7 (ref 2.0–3.0)

## 2018-06-03 MED ORDER — WARFARIN SODIUM 5 MG PO TABS: ORAL_TABLET | ORAL | 6 refills | Status: DC

## 2018-06-03 NOTE — Patient Instructions (Signed)
Continue coumadin 1 1/2 tablets daily. Recheck in 6 weeks in [place] office.   

## 2018-06-25 ENCOUNTER — Ambulatory Visit: Payer: Medicare Other | Admitting: Family Medicine

## 2018-06-25 ENCOUNTER — Ambulatory Visit: Payer: Medicare Other | Admitting: Internal Medicine

## 2018-06-27 ENCOUNTER — Other Ambulatory Visit: Payer: Self-pay | Admitting: Internal Medicine

## 2018-07-03 DIAGNOSIS — N184 Chronic kidney disease, stage 4 (severe): Secondary | ICD-10-CM | POA: Diagnosis not present

## 2018-07-15 ENCOUNTER — Ambulatory Visit: Payer: Self-pay | Admitting: Orthopedic Surgery

## 2018-07-17 ENCOUNTER — Ambulatory Visit (INDEPENDENT_AMBULATORY_CARE_PROVIDER_SITE_OTHER): Payer: Medicare Other | Admitting: *Deleted

## 2018-07-17 DIAGNOSIS — I480 Paroxysmal atrial fibrillation: Secondary | ICD-10-CM | POA: Diagnosis not present

## 2018-07-17 DIAGNOSIS — Z5181 Encounter for therapeutic drug level monitoring: Secondary | ICD-10-CM | POA: Diagnosis not present

## 2018-07-17 LAB — POCT INR: INR: 2.8 (ref 2.0–3.0)

## 2018-07-17 NOTE — Patient Instructions (Signed)
Continue coumadin 1 1/2 tablets daily. Recheck in 6 weeks in [place] office.   

## 2018-07-25 ENCOUNTER — Ambulatory Visit: Payer: Medicare Other | Admitting: Family Medicine

## 2018-07-25 ENCOUNTER — Ambulatory Visit: Payer: Medicare Other | Admitting: Internal Medicine

## 2018-08-08 ENCOUNTER — Ambulatory Visit (INDEPENDENT_AMBULATORY_CARE_PROVIDER_SITE_OTHER): Payer: Medicare Other | Admitting: Internal Medicine

## 2018-08-08 ENCOUNTER — Encounter: Payer: Self-pay | Admitting: Internal Medicine

## 2018-08-08 ENCOUNTER — Ambulatory Visit (INDEPENDENT_AMBULATORY_CARE_PROVIDER_SITE_OTHER): Payer: Medicare Other | Admitting: Family Medicine

## 2018-08-08 ENCOUNTER — Other Ambulatory Visit: Payer: Self-pay

## 2018-08-08 ENCOUNTER — Encounter: Payer: Self-pay | Admitting: Family Medicine

## 2018-08-08 VITALS — BP 130/80 | HR 95 | Temp 98.8°F | Ht 66.0 in

## 2018-08-08 DIAGNOSIS — N183 Chronic kidney disease, stage 3 unspecified: Secondary | ICD-10-CM

## 2018-08-08 DIAGNOSIS — J4520 Mild intermittent asthma, uncomplicated: Secondary | ICD-10-CM

## 2018-08-08 DIAGNOSIS — I10 Essential (primary) hypertension: Secondary | ICD-10-CM | POA: Diagnosis not present

## 2018-08-08 DIAGNOSIS — E782 Mixed hyperlipidemia: Secondary | ICD-10-CM

## 2018-08-08 DIAGNOSIS — M1712 Unilateral primary osteoarthritis, left knee: Secondary | ICD-10-CM

## 2018-08-08 MED ORDER — ROSUVASTATIN CALCIUM 10 MG PO TABS: 10.0000 mg | ORAL_TABLET | Freq: Every day | ORAL | 3 refills | Status: DC

## 2018-08-08 MED ORDER — BUDESONIDE-FORMOTEROL FUMARATE 160-4.5 MCG/ACT IN AERO: 2.0000 | INHALATION_SPRAY | Freq: Two times a day (BID) | RESPIRATORY_TRACT | 11 refills | Status: DC

## 2018-08-08 MED ORDER — SODIUM BICARBONATE 650 MG PO TABS: 650.0000 mg | ORAL_TABLET | Freq: Two times a day (BID) | ORAL | 11 refills | Status: AC

## 2018-08-08 NOTE — Patient Instructions (Signed)
Please take all new medication as prescribed - the symbicort  Please continue all other medications as before, and refills have been done if requested.  Please have the pharmacy call with any other refills you may need.  Please continue your efforts at being more active, low cholesterol diet, and weight control.  Please keep your appointments with your specialists as you may have planned  Please return in 6 months, or sooner if needed

## 2018-08-08 NOTE — Progress Notes (Signed)
Subjective:    Patient ID: DEMPSY [name], male    DOB: [birth year], 85 y.o.   MRN: [id number]  HPI  Here for yearly f/u;  Overall doing ok;  Pt denies Chest pain, orthopnea, PND, worsening LE edema, palpitations, dizziness or syncope, but has had several months mild intermittent sob/doe with wheezing.  Pt denies neurological change such as new headache, facial or extremity weakness.  Pt denies polydipsia, polyuria, or low sugar symptoms. Pt states overall good compliance with treatment and medications, good tolerability, and has been trying to follow appropriate diet.  Pt denies worsening depressive symptoms, suicidal ideation or panic. No fever, night sweats, wt loss, loss of appetite, or other constitutional symptoms.  Pt states good ability with ADL's, has low fall risk, home safety reviewed and adequate, no other significant changes in hearing or vision, Has had labs x 2 per renal though not yet seen in f/u in person due to pandemic.  Denies worsening reflux, abd pain, dysphagia, n/v, bowel change or blood. Past Medical History:  Diagnosis Date  . ADENOCARCINOMA, PROSTATE, GLEASON GRADE 5 01/21/2009  . ALLERGIC RHINITIS 01/14/2007  . Bilateral inguinal hernia   . Cholecystitis   . Cholelithiasis   . CKD (chronic kidney disease) stage 3, GFR 30-59 ml/min (HCC) 04/30/2015  . COLONIC POLYPS, HX OF 01/14/2007  . ELEVATED PROSTATE SPECIFIC ANTIGEN 03/27/2008  . ESOPHAGITIS 01/14/2007  . GERD (gastroesophageal reflux disease)   . HYPERLIPIDEMIA 01/14/2007  . HYPERTENSION 01/14/2007  . LIBIDO, DECREASED 01/15/2007  . PAF (paroxysmal atrial fibrillation) ([place])   . Paralysis ([place])    from [place] 02/2013   Past Surgical History:  Procedure Laterality Date  . CHOLECYSTECTOMY N/A 08/17/2017   Procedure: LAPAROSCOPIC CHOLECYSTECTOMY WITH INTRAOPERATIVE CHOLANGIOGRAM ERAS PATHWAY;  Surgeon: Judeth Horn, MD;  Location: [place];  Service: General;  Laterality: N/A;  . ENDOSCOPIC RETROGRADE  CHOLANGIOPANCREATOGRAPHY (ERCP) WITH PROPOFOL N/A 08/18/2017   Procedure: ENDOSCOPIC RETROGRADE CHOLANGIOPANCREATOGRAPHY (ERCP) WITH PROPOFOL;  Surgeon: Doran Stabler, MD;  Location: [facility];  Service: Endoscopy;  Laterality: N/A;  . ERCP N/A 08/21/2017   Procedure: ENDOSCOPIC RETROGRADE CHOLANGIOPANCREATOGRAPHY (ERCP);  Surgeon: Clarene Essex, MD;  Location: [facility];  Service: Endoscopy;  Laterality: N/A;  . ESOPHAGOGASTRODUODENOSCOPY N/A 08/15/2013   Procedure: ESOPHAGOGASTRODUODENOSCOPY (EGD);  Surgeon: Gwenyth Ober, MD;  Location: [facility];  Service: General;  Laterality: N/A;  . ESOPHAGOGASTRODUODENOSCOPY (EGD) WITH PROPOFOL N/A 08/18/2017   Procedure: ESOPHAGOGASTRODUODENOSCOPY (EGD) WITH PROPOFOL;  Surgeon: Doran Stabler, MD;  Location: [facility];  Service: Endoscopy;  Laterality: N/A;  . HERNIA REPAIR     Bilateral Inguinal hernias  . HIP PINNING,CANNULATED Left 07/18/2017   Procedure: CANNULATED HIP PINNING;  Surgeon: Carole Civil, MD;  Location: [facility];  Service: Orthopedics;  Laterality: Left;  . HIP SURGERY     Left  . IR EXCHANGE BILIARY DRAIN  05/24/2017  . IR EXCHANGE BILIARY DRAIN  07/27/2017  . IR PERC CHOLECYSTOSTOMY  04/10/2017  . JOINT REPLACEMENT    . PANCREATIC STENT PLACEMENT  08/21/2017   Procedure: PANCREATIC STENT PLACEMENT;  Surgeon: Clarene Essex, MD;  Location: [employer];  Service: Endoscopy;;  . PEG PLACEMENT N/A 09/02/2013   Procedure: PERCUTANEOUS ENDOSCOPIC GASTROSTOMY (PEG) PLACEMENT;  Surgeon: Gwenyth Ober, MD;  Location: [facility];  Service: General;  Laterality: N/A;  . PROSTATE CRYOABLATION    . REMOVAL OF STONES  08/21/2017   Procedure: REMOVAL OF STONES;  Surgeon: Clarene Essex, MD;  Location: [facility];  Service:  Endoscopy;;  . SPHINCTEROTOMY  08/21/2017   Procedure: SPHINCTEROTOMY;  Surgeon: Clarene Essex, MD;  Location[facility] ENDOSCOPY;  Service: Endoscopy;;    reports that he has quit smoking. He has never used smokeless  tobacco. He reports previous alcohol use. He reports that he does not use drugs. family history includes Arthritis in his unknown relative; Hypertension in an other family member; Lung cancer in his father. No Known Allergies Current Outpatient Medications on File Prior to Visit  Medication Sig Dispense Refill  . acetaminophen (TYLENOL) 325 MG tablet Take 650 mg by mouth every 8 (eight) hours as needed for moderate pain.     . cholecalciferol (VITAMIN D) 1000 units tablet Take 2,000 Units by mouth daily.    . Control Gel Formula Dressing (DUODERM CGF DRESSING) MISC Apply 1 each topically daily as needed. 100 each 3  . diclofenac sodium (VOLTAREN) 1 % GEL Apply 2 g topically 4 (four) times daily as needed. 100 g 11  . furosemide (LASIX) 20 MG tablet Take 20 mg by mouth daily.    [name] gabapentin (NEURONTIN) 100 MG capsule TAKE 2 TABLETS BY MOUTH TWICE DAILY. 180 capsule 3  . gabapentin (NEURONTIN) 300 MG capsule Take 1 capsule (300 mg total) by mouth at bedtime. 90 capsule 3  . omeprazole (PRILOSEC) 40 MG capsule TAKE ONE CAPSULE BY MOUTH ONCE DAILY. 90 capsule 0  . warfarin (COUMADIN) 5 MG tablet Take 1 1/2 tablets daily or as directed 45 tablet 6   No current facility-administered medications on file prior to visit.    Review of Systems  Constitutional: Negative for other unusual diaphoresis or sweats HENT: Negative for ear discharge or swelling Eyes: Negative for other worsening visual disturbances Respiratory: Negative for stridor or other swelling  Gastrointestinal: Negative for worsening distension or other blood Genitourinary: Negative for retention or other urinary change Musculoskeletal: Negative for other MSK pain or swelling Skin: Negative for color change or other new lesions Neurological: Negative for worsening tremors and other numbness  Psychiatric/Behavioral: Negative for worsening agitation or other fatigue All other system neg per pt    Objective:   Physical Exam BP  130/80 (BP Location: Right Arm, Patient Position: Sitting, Cuff Size: Normal)   Pulse 95   Temp 98.8 F (37.1 C) (Oral)   Ht 5' 6"  (1.676 m)   SpO2 95%   BMI 29.05 kg/m  VS noted, frail but unchanged from previous visit, examined in wheelchair Constitutional: Pt appears in NAD HENT: Head: NCAT.  Right Ear: External ear normal.  Left Ear: External ear normal.  Eyes: . Pupils are equal, round, and reactive to light. Conjunctivae and EOM are normal Nose: without d/c or deformity Neck: Neck supple. Gross normal ROM Cardiovascular: Normal rate and regular rhythm.   Pulmonary/Chest: Effort normal and breath sounds decreased with few bilat wheezing, without rales  Abd:  Soft, NT, ND, + BS, no organomegaly Neurological: [name] alert. At baseline orientation, motor grossly intact Skin: Skin is warm. No rashes, other new lesions, no LE edema Psychiatric: Pt behavior is normal without agitation  No other exam findings Lab Results  Component Value Date   WBC 6.3 09/14/2017   HGB 12.4 (L) 09/14/2017   HCT 36.9 (L) 09/14/2017   PLT 194.0 09/14/2017   GLUCOSE 96 09/14/2017   CHOL 165 05/18/2016   TRIG 217.0 (H) 05/18/2016   HDL 35.00 (L) 05/18/2016   LDLDIRECT 84.0 05/18/2016   LDLCALC 110 (H) 04/03/2013   ALT 11 09/14/2017   AST  12 09/14/2017   NA 143 09/14/2017   K 3.9 09/14/2017   CL 108 09/14/2017   CREATININE 1.59 (H) 09/14/2017   BUN 24 (H) 09/14/2017   CO2 29 09/14/2017   TSH 1.73 04/30/2015   PSA 7.27 (H) 04/14/2008   INR 2.8 07/17/2018   HGBA1C 6.0 (H) 09/04/2013      Assessment & Plan:

## 2018-08-08 NOTE — Patient Instructions (Signed)
Welcome to [place] Can repeat every 10 weeks-steriod Can do gel injection in 6 or 7 weeks

## 2018-08-08 NOTE — Progress Notes (Signed)
[name] [address], [place] [id number] Phone: [phone] Subjective:     CC: left knee pain  VWP:[id number]  Austin [name] is a 85 y.o. male coming in with complaint of  Left knee pain.  Stroke.  Patient does have some left knee arthritis for some time.  Has responded fairly well to injections including steroids and Visco supplementation.  Monovisc last used in February 2020.  Patient over the last month that started to increase again.  Having difficulty with changing position.  Follow-up exam 4 to 8 weeks    Past Medical History:  Diagnosis Date  . ADENOCARCINOMA, PROSTATE, GLEASON GRADE 5 01/21/2009  . ALLERGIC RHINITIS 01/14/2007  . Bilateral inguinal hernia   . Cholecystitis   . Cholelithiasis   . CKD (chronic kidney disease) stage 3, GFR 30-59 ml/min (HCC) 04/30/2015  . COLONIC POLYPS, HX OF 01/14/2007  . ELEVATED PROSTATE SPECIFIC ANTIGEN 03/27/2008  . ESOPHAGITIS 01/14/2007  . GERD (gastroesophageal reflux disease)   . HYPERLIPIDEMIA 01/14/2007  . HYPERTENSION 01/14/2007  . LIBIDO, DECREASED 01/15/2007  . PAF (paroxysmal atrial fibrillation) ([place])   . Paralysis ([place])    from [place] 02/2013   Past Surgical History:  Procedure Laterality Date  . CHOLECYSTECTOMY N/A 08/17/2017   Procedure: LAPAROSCOPIC CHOLECYSTECTOMY WITH INTRAOPERATIVE CHOLANGIOGRAM ERAS PATHWAY;  Surgeon: Judeth Horn, MD;  Location: [facility];  Service: General;  Laterality: N/A;  . ENDOSCOPIC RETROGRADE CHOLANGIOPANCREATOGRAPHY (ERCP) WITH PROPOFOL N/A 08/18/2017   Procedure: ENDOSCOPIC RETROGRADE CHOLANGIOPANCREATOGRAPHY (ERCP) WITH PROPOFOL;  Surgeon: Doran Stabler, MD;  Location: [facility];  Service: Endoscopy;  Laterality: N/A;  . ERCP N/A 08/21/2017   Procedure: ENDOSCOPIC RETROGRADE CHOLANGIOPANCREATOGRAPHY (ERCP);  Surgeon: Clarene Essex, MD;  Location: [facility];  Service: Endoscopy;  Laterality: N/A;  . ESOPHAGOGASTRODUODENOSCOPY N/A 08/15/2013   Procedure: ESOPHAGOGASTRODUODENOSCOPY (EGD);  Surgeon: Gwenyth Ober, MD;  Location: [facility];  Service: General;  Laterality: N/A;  . ESOPHAGOGASTRODUODENOSCOPY (EGD) WITH PROPOFOL N/A 08/18/2017   Procedure: ESOPHAGOGASTRODUODENOSCOPY (EGD) WITH PROPOFOL;  Surgeon: Doran Stabler, MD;  Location: [facility];  Service: Endoscopy;  Laterality: N/A;  . HERNIA REPAIR     Bilateral Inguinal hernias  . HIP PINNING,CANNULATED Left 07/18/2017   Procedure: CANNULATED HIP PINNING;  Surgeon: Carole Civil, MD;  Location: [facility];  Service: Orthopedics;  Laterality: Left;  . HIP SURGERY     Left  . IR EXCHANGE BILIARY DRAIN  05/24/2017  . IR EXCHANGE BILIARY DRAIN  07/27/2017  . IR PERC CHOLECYSTOSTOMY  04/10/2017  . JOINT REPLACEMENT    . PANCREATIC STENT PLACEMENT  08/21/2017   Procedure: PANCREATIC STENT PLACEMENT;  Surgeon: Clarene Essex, MD;  Location: [facility];  Service: Endoscopy;;  . PEG PLACEMENT N/A 09/02/2013   Procedure: PERCUTANEOUS ENDOSCOPIC GASTROSTOMY (PEG) PLACEMENT;  Surgeon: Gwenyth Ober, MD;  Location: [facility];  Service: General;  Laterality: N/A;  . PROSTATE CRYOABLATION    . REMOVAL OF STONES  08/21/2017   Procedure: REMOVAL OF STONES;  Surgeon: Clarene Essex, MD;  Location: [facility];  Service: Endoscopy;;  . Austin [name]  08/21/2017   Procedure: [name];  Surgeon: Clarene Essex, MD;  Location: [facility] END[facility]COPY;  Service: Endoscopy;;   Social History   Socioeconomic History  . Marital status: Married    Spouse name: Not on file  . Number of children: 3  . Years of education: 57  . Highest education level: Not on file  Occupational History  . Occupation: Dietitian  Employer: [employer]  Social Needs  . Financial resource strain: Not on file  . Food insecurity    Worry: Not on file    Inability: Not on file  . Transportation needs    Medical: Not on file    Non-medical: Not on file  Tobacco Use  . Smoking status: Former Research scientist (life sciences)   . Smokeless tobacco: Never Used  Substance and Sexual Activity  . Alcohol use: Not Currently    Comment: rarely  . Drug use: No  . Sexual activity: Not Currently    Birth control/protection: None  Lifestyle  . Physical activity    Days per week: Not on file    Minutes per session: Not on file  . Stress: Not on file  Relationships  . Social Herbalist on phone: Not on file    Gets together: Not on file    Attends religious service: Not on file    Active member of club or organization: Not on file    Attends meetings of clubs or organizations: Not on file    Relationship status: Not on file  Other Topics Concern  . Not on file  Social History Narrative   ** Merged History Encounter **       Building services engineer. Married 1961. 2 sons- '63, '69 & 1 daughter '55   Grandchildren 5. 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 Working full time. Discussion needed in regard to Advance Care Planning-DNR/DNI, no artificial feeding or hydration, No HD, no heroic or futile.                No Known Allergies Family History  Problem Relation Age of Onset  . Lung cancer Father   . Hypertension Other   . Arthritis Unknown      Current Outpatient Medications (Cardiovascular):  .  furosemide (LASIX) 20 MG tablet, Take 20 mg by mouth daily. .  rosuvastatin (CRESTOR) 10 MG tablet, TAKE ONE TABLET BY MOUTH ONCE DAILY.   Current Outpatient Medications (Analgesics):  .  acetaminophen (TYLENOL) 325 MG tablet, Take 650 mg by mouth every 8 (eight) hours as needed for moderate pain.   Current Outpatient Medications (Hematological):  .  warfarin (COUMADIN) 5 MG tablet, Take 1 1/2 tablets daily or as directed  Current Outpatient Medications (Other):  .  cholecalciferol (VITAMIN D) 1000 units tablet, Take 2,000 Units by mouth daily. .  Control Gel Formula Dressing (DUODERM CGF DRESSING) MISC, Apply 1 each topically daily as needed. .  diclofenac sodium (VOLTAREN) 1 % GEL, Apply 2 g topically 4 (four)  times daily as needed. .  gabapentin (NEURONTIN) 100 MG capsule, TAKE 2 TABLETS BY MOUTH TWICE DAILY. [name]  gabapentin (NEURONTIN) 300 MG capsule, Take 1 capsule (300 mg total) by mouth at bedtime. [name]  omeprazole (PRILOSEC) 40 MG capsule, TAKE ONE CAPSULE BY MOUTH ONCE DAILY.    Past medical history, social, surgical and family history all reviewed in electronic medical record.  No pertanent information unless stated regarding to the chief complaint.   Review of Systems:  No headache, visual changes, nausea, vomiting, diarrhea, constipation, dizziness, abdominal pain, skin rash, fevers, chills, night sweats, weight loss, swollen lymph nodes, body aches, joint swelling,  chest pain, shortness of breath, mood changes.  Positive muscle aches Objective  Height 5' 6"  (1.676 m).    General: No apparent distress alert and oriented x3 mood and affect normal, dressed appropriately.  Patient was sitting in his car HEENT: Pupils equal, extraocular  movements intact  Respiratory: Patient's speak in full sentences and does not appear short of breath  Left knee tender to palpation over the lateral joint space.  Trace effusion  After informed written and verbal consent, patient was seated on exam table. Left knee was prepped with alcohol swab and utilizing anterolateral approach, patient's left knee space was injected with 4:1  marcaine 0.5%: Kenalog 41m/dL. Patient tolerated the procedure well without immediate complications.    Impression and Recommendations:     . The above documentation has been reviewed and is accurate and complete ZLyndal Pulley DO       Note: This dictation was prepared with Dragon dictation along with smaller phrase technology. Any transcriptional errors that result from this process are unintentional.

## 2018-08-08 NOTE — Assessment & Plan Note (Signed)
Patient given injection today.  Tolerated procedure well.  Discussed icing regimen and home exercise.  Discussed which activities of doing which wants to avoid.  Patient will increase activity slowly.  Follow-up again in 4 to 8 weeks

## 2018-08-10 ENCOUNTER — Encounter: Payer: Self-pay | Admitting: Internal Medicine

## 2018-08-10 DIAGNOSIS — J45909 Unspecified asthma, uncomplicated: Secondary | ICD-10-CM | POA: Insufficient documentation

## 2018-08-10 NOTE — Assessment & Plan Note (Addendum)
Has wheezing sob/doe intermittent mild recent onset, likely pulm (less likely cardiac), for symbicort and albuterol asd,  to f/u any worsening symptoms or concerns  Note:  Total time for pt hx, exam, review of record with pt in the room, determination of diagnoses and plan for further eval and tx is > 40 min, with over 50% spent in coordination and counseling of patient including the differential dx, tx, further evaluation and other management of new onset probable asthma, CKD, HTN, HLD

## 2018-08-10 NOTE — Assessment & Plan Note (Signed)
stable overall by history and exam, recent data reviewed with pt, and pt to continue medical treatment as before,  to f/u any worsening symptoms or concerns  

## 2018-08-15 ENCOUNTER — Other Ambulatory Visit: Payer: Self-pay | Admitting: Family Medicine

## 2018-08-19 ENCOUNTER — Ambulatory Visit: Payer: Self-pay | Admitting: Internal Medicine

## 2018-08-19 NOTE — Telephone Encounter (Signed)
Dr. Daine Gip with [facility] and pt. Wife report pt. Has swelling to right knee, warm to touch, painful. Dr. Konrad Penta concerned pt. Could have a DVT. Spoke with Tammy in the practice. Recommends pt. Go to ED. Wife refuses. Dr. Konrad Penta requests [facility] come to home and ultrasound leg. Phone [phone]. Fax [phone].Would need an order faxed with face sheet and diagnosis code. Dr. Konrad Penta reports it is too difficult to take pt. Out of the home. Please advise wife. Answer Assessment - Initial Assessment Questions 1. LOCATION: "Where is the swelling located?"  (e.g., left, right, both knees)     Right knee 2. SIZE and DESCRIPTION: "What does the swelling look like?"  (e.g., entire knee, localized)     Entire knee 3. ONSET: "When did the swelling start?" "Does it come and go, or is it there all the time?"     Started last night 4. PAIN: "Is there any pain?" If so, ask: "How bad is it?" (Scale 1-10; or mild, moderate, severe)     7 5. SETTING: "Has there been any recent work, exercise or other activity that involved that part of the body?"      No 6. AGGRAVATING FACTORS: "What makes the knee swelling worse?" (e.g., walking, climbing stairs, running)     Movement 7. ASSOCIATED SYMPTOMS: "Is there any pain or redness?"     Warm to touch and painful 8. OTHER SYMPTOMS: "Do you have any other symptoms?" (e.g., chest pain, difficulty breathing, fever, calf pain)     No 9. PREGNANCY: "Is there any chance you are pregnant?" "When was your last menstrual period?"     N/A  Protocols used: KNEE [place]

## 2018-08-19 NOTE — Telephone Encounter (Signed)
I would advise calling EMS for transport to ED as I would not normally order a test to look for a life threatening disorder to be done by a traveling technician

## 2018-08-20 ENCOUNTER — Other Ambulatory Visit: Payer: Self-pay

## 2018-08-20 ENCOUNTER — Emergency Department (HOSPITAL_COMMUNITY): Payer: Medicare Other

## 2018-08-20 ENCOUNTER — Emergency Department (HOSPITAL_COMMUNITY)
Admission: EM | Admit: 2018-08-20 | Discharge: 2018-08-20 | Disposition: A | Discharge status: Home / Self Care | Payer: Medicare Other | Attending: Emergency Medicine | Admitting: Emergency Medicine

## 2018-08-20 ENCOUNTER — Encounter (HOSPITAL_COMMUNITY): Payer: Self-pay | Admitting: *Deleted

## 2018-08-20 DIAGNOSIS — M1711 Unilateral primary osteoarthritis, right knee: Secondary | ICD-10-CM | POA: Insufficient documentation

## 2018-08-20 DIAGNOSIS — M25461 Effusion, right knee: Secondary | ICD-10-CM | POA: Insufficient documentation

## 2018-08-20 DIAGNOSIS — Z79899 Other long term (current) drug therapy: Secondary | ICD-10-CM | POA: Insufficient documentation

## 2018-08-20 DIAGNOSIS — N183 Chronic kidney disease, stage 3 (moderate): Secondary | ICD-10-CM | POA: Diagnosis not present

## 2018-08-20 DIAGNOSIS — G8190 Hemiplegia, unspecified affecting unspecified side: Secondary | ICD-10-CM | POA: Diagnosis not present

## 2018-08-20 DIAGNOSIS — R5381 Other malaise: Secondary | ICD-10-CM | POA: Diagnosis not present

## 2018-08-20 DIAGNOSIS — Z7901 Long term (current) use of anticoagulants: Secondary | ICD-10-CM | POA: Diagnosis not present

## 2018-08-20 DIAGNOSIS — I10 Essential (primary) hypertension: Secondary | ICD-10-CM | POA: Diagnosis not present

## 2018-08-20 DIAGNOSIS — I129 Hypertensive chronic kidney disease with stage 1 through stage 4 chronic kidney disease, or unspecified chronic kidney disease: Secondary | ICD-10-CM | POA: Insufficient documentation

## 2018-08-20 DIAGNOSIS — Z209 Contact with and (suspected) exposure to unspecified communicable disease: Secondary | ICD-10-CM | POA: Diagnosis not present

## 2018-08-20 DIAGNOSIS — Z743 Need for continuous supervision: Secondary | ICD-10-CM | POA: Diagnosis not present

## 2018-08-20 DIAGNOSIS — M25561 Pain in right knee: Secondary | ICD-10-CM | POA: Diagnosis not present

## 2018-08-20 DIAGNOSIS — R52 Pain, unspecified: Secondary | ICD-10-CM | POA: Diagnosis not present

## 2018-08-20 DIAGNOSIS — R609 Edema, unspecified: Secondary | ICD-10-CM | POA: Diagnosis not present

## 2018-08-20 LAB — PROTIME-INR
INR: 2.2 — ABNORMAL HIGH (ref 0.8–1.2)
Prothrombin Time: 23.9 seconds — ABNORMAL HIGH (ref 11.4–15.2)

## 2018-08-20 LAB — D-DIMER, QUANTITATIVE: D-Dimer, Quant: <0.27 ug/mL-FEU (ref 0.00–0.50)

## 2018-08-20 MED ORDER — HYDROCODONE-ACETAMINOPHEN 5-325 MG PO TABS
1.0000 | ORAL_TABLET | Freq: Once | ORAL | Status: AC
  Administered 2018-08-20: 1 via ORAL
  Filled 2018-08-20: qty 1

## 2018-08-20 MED ORDER — ONDANSETRON HCL 4 MG PO TABS
4.0000 mg | ORAL_TABLET | Freq: Once | ORAL | Status: AC
  Administered 2018-08-20: 4 mg via ORAL
  Filled 2018-08-20: qty 1

## 2018-08-20 MED ORDER — TRAMADOL HCL 50 MG PO TABS: 50.0000 mg | ORAL_TABLET | Freq: Four times a day (QID) | ORAL | 0 refills | Status: DC | PRN

## 2018-08-20 NOTE — ED Notes (Signed)
P-xray done

## 2018-08-20 NOTE — Telephone Encounter (Signed)
Pt's wife has been informed and expressed understanding. She stated that she would call EMS.

## 2018-08-20 NOTE — ED Triage Notes (Signed)
Pt c/o swelling and pain when standing to right knee that started Monday. Pt called his PCP to get appt and he was told to come to ED for possible blood clot. Pt reports he is currently taking Warfarin.

## 2018-08-20 NOTE — Discharge Instructions (Addendum)
Your x-ray is negative for fracture or dislocation.  It does show advanced arthritis involving your knee.  There is a small amount of fluid in the knee area.  The d-dimer test for blood clots was negative.  Please keep your leg elevated above your waist until the swelling goes down.  Heating pad to the area may be helpful.  Use Tylenol extra strength every 4 hours for mild pain.  May use Ultram every 6 hours if needed for severe pain.  Please let your primary physician know that you were seen in the emergency department so that the findings of today's visit can be made a part of your medical record.

## 2018-08-20 NOTE — ED Notes (Signed)
Lab at bedside
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
## 2018-08-21 ENCOUNTER — Ambulatory Visit: Payer: Medicare Other | Admitting: Orthopedic Surgery

## 2018-08-28 ENCOUNTER — Ambulatory Visit (INDEPENDENT_AMBULATORY_CARE_PROVIDER_SITE_OTHER): Payer: Medicare Other | Admitting: *Deleted

## 2018-08-28 ENCOUNTER — Other Ambulatory Visit: Payer: Self-pay

## 2018-08-28 DIAGNOSIS — I480 Paroxysmal atrial fibrillation: Secondary | ICD-10-CM | POA: Diagnosis not present

## 2018-08-28 DIAGNOSIS — Z5181 Encounter for therapeutic drug level monitoring: Secondary | ICD-10-CM

## 2018-08-28 LAB — POCT INR: INR: 2.6 (ref 2.0–3.0)

## 2018-08-28 NOTE — Patient Instructions (Signed)
Continue coumadin 1 1/2 tablets daily. Recheck in 6 weeks in [place] office.

## 2018-09-10 DIAGNOSIS — N184 Chronic kidney disease, stage 4 (severe): Secondary | ICD-10-CM | POA: Diagnosis not present

## 2018-09-20 ENCOUNTER — Other Ambulatory Visit: Payer: Self-pay | Admitting: Internal Medicine

## 2018-09-23 ENCOUNTER — Other Ambulatory Visit: Payer: Self-pay

## 2018-09-23 ENCOUNTER — Encounter: Payer: Self-pay | Admitting: Orthopedic Surgery

## 2018-09-23 ENCOUNTER — Ambulatory Visit: Payer: Medicare Other

## 2018-09-23 ENCOUNTER — Ambulatory Visit (INDEPENDENT_AMBULATORY_CARE_PROVIDER_SITE_OTHER): Payer: Medicare Other | Admitting: Orthopedic Surgery

## 2018-09-23 VITALS — BP 146/80 | HR 68 | Temp 97.9°F | Ht 66.5 in

## 2018-09-23 DIAGNOSIS — S72002S Fracture of unspecified part of neck of left femur, sequela: Secondary | ICD-10-CM

## 2018-09-23 DIAGNOSIS — Z9889 Other specified postprocedural states: Secondary | ICD-10-CM | POA: Diagnosis not present

## 2018-09-23 DIAGNOSIS — Z8781 Personal history of (healed) traumatic fracture: Secondary | ICD-10-CM

## 2018-09-23 NOTE — Progress Notes (Signed)
Progress Note   Patient ID: Austin [name], male   DOB: [birth year], 85 y.o.   MRN: [id number]   Chief Complaint  Patient presents with  . Routine Post Op    07/18/17 left hip fracture/ fixation cannulated screws     Encounter Diagnosis  Name Primary?  . S/P ORIF (open reduction internal fixation) fracture left hip cannulated screw placement 07/18/17 Yes    85 year old male with hemiparesis on the left due to traumatic brain injury had a left femoral neck fracture treated with internal fixation June of 03/14/2017  He is doing well pain-free    Review of Systems  Neurological: Positive for weakness.      BP (!) 146/80   Pulse 68   Temp 97.9 F (36.6 C)   Ht 5' 6.5" (1.689 m)   BMI 31.80 kg/m   Physical Exam   Medical decisions:  (Established problem worse, x-ray ,physical therapy, over-the-counter medicines, read outside film or summarize x-ray)  Data  Imaging:   Radiographs today show that the fracture healed nicely the screws are in place and intact there is no evidence of AVN  Encounter Diagnosis  Name Primary?  . S/P ORIF (open reduction internal fixation) fracture left hip cannulated screw placement 07/18/17 Yes    PLAN:   RELEASED     Arther Abbott, MD 09/23/2018 2:17 PM

## 2018-09-26 ENCOUNTER — Other Ambulatory Visit: Payer: Self-pay

## 2018-09-26 ENCOUNTER — Ambulatory Visit (INDEPENDENT_AMBULATORY_CARE_PROVIDER_SITE_OTHER): Payer: Medicare Other | Admitting: Internal Medicine

## 2018-09-26 ENCOUNTER — Encounter: Payer: Self-pay | Admitting: Internal Medicine

## 2018-09-26 VITALS — BP 128/84 | HR 75 | Temp 98.4°F | Ht 66.5 in

## 2018-09-26 DIAGNOSIS — I10 Essential (primary) hypertension: Secondary | ICD-10-CM | POA: Diagnosis not present

## 2018-09-26 DIAGNOSIS — N183 Chronic kidney disease, stage 3 unspecified: Secondary | ICD-10-CM

## 2018-09-26 DIAGNOSIS — G8114 Spastic hemiplegia affecting left nondominant side: Secondary | ICD-10-CM

## 2018-09-26 NOTE — Patient Instructions (Signed)
Your prescription for the standard wheelchair with accessories will be faxed to [facility]  Please continue all other medications as before, and refills have been done if requested.  Please have the pharmacy call with any other refills you may need.  Please continue your efforts at being more active, low cholesterol diet, and weight control.  Please keep your appointments with your specialists as you may have planned

## 2018-09-26 NOTE — Progress Notes (Addendum)
Subjective:    Patient ID: Austin [name], male    DOB: 1932-10-10, 85 y.o.   MRN: [id number]  HPI  Pt here to f/u with wife after PT evaluation discovering 63yo wheelchair with several borken aspects, including the left brake and left leg extension. Pt continues to require due to hx of left spastic hemiparesis, which is permanent and not expected to improve.  Patient has mobility limitations which cannot be resolved with a cane, crutch or walker. Patient can safely self propel the wheelchair or has a caregiver who can assist all the time.  Pt denies chest pain, increased sob or doe, wheezing, orthopnea, PND, increased LE swelling, palpitations, dizziness or syncope.  Pt denies new neurological symptoms such as new headache, or facial or extremity weakness or numbness   Pt denies polydipsia, polyuria,   Patient has mobility limitations which cannot be resolved with a cane, crutch or walker. Patient can safely self propel the wheelchair or has a caregiver who can assist all the time.  Past Medical History:  Diagnosis Date  . ADENOCARCINOMA, PROSTATE, GLEASON GRADE 5 01/21/2009  . ALLERGIC RHINITIS 01/14/2007  . Bilateral inguinal hernia   . Cholecystitis   . Cholelithiasis   . CKD (chronic kidney disease) stage 3, GFR 30-59 ml/min (HCC) 04/30/2015  . COLONIC POLYPS, HX OF 01/14/2007  . ELEVATED PROSTATE SPECIFIC ANTIGEN 03/27/2008  . ESOPHAGITIS 01/14/2007  . GERD (gastroesophageal reflux disease)   . HYPERLIPIDEMIA 01/14/2007  . HYPERTENSION 01/14/2007  . LIBIDO, DECREASED 01/15/2007  . PAF (paroxysmal atrial fibrillation) ([place])   . Paralysis ([place])    from [place] 02/2013   Past Surgical History:  Procedure Laterality Date  . CHOLECYSTECTOMY N/A 08/17/2017   Procedure: LAPAROSCOPIC CHOLECYSTECTOMY WITH INTRAOPERATIVE CHOLANGIOGRAM ERAS PATHWAY;  Surgeon: Austin Horn, MD;  Location: [place];  Service: General;  Laterality: N/A;  . ENDOSCOPIC RETROGRADE CHOLANGIOPANCREATOGRAPHY (ERCP) WITH PROPOFOL  N/A 08/18/2017   Procedure: ENDOSCOPIC RETROGRADE CHOLANGIOPANCREATOGRAPHY (ERCP) WITH PROPOFOL;  Surgeon: Austin Stabler, MD;  Location: [place];  Service: Endoscopy;  Laterality: N/A;  . ERCP N/A 08/21/2017   Procedure: ENDOSCOPIC RETROGRADE CHOLANGIOPANCREATOGRAPHY (ERCP);  Surgeon: Austin Essex, MD;  Location: [place];  Service: Endoscopy;  Laterality: N/A;  . ESOPHAGOGASTRODUODENOSCOPY N/A 08/15/2013   Procedure: ESOPHAGOGASTRODUODENOSCOPY (EGD);  Surgeon: Austin Ober, MD;  Location: [place];  Service: General;  Laterality: N/A;  . ESOPHAGOGASTRODUODENOSCOPY (EGD) WITH PROPOFOL N/A 08/18/2017   Procedure: ESOPHAGOGASTRODUODENOSCOPY (EGD) WITH PROPOFOL;  Surgeon: Austin Stabler, MD;  Location: [place];  Service: Endoscopy;  Laterality: N/A;  . HERNIA REPAIR     Bilateral Inguinal hernias  . HIP PINNING,CANNULATED Left 07/18/2017   Procedure: CANNULATED HIP PINNING;  Surgeon: Austin Civil, MD;  Location: [facility];  Service: Orthopedics;  Laterality: Left;  . HIP SURGERY     Left  . IR EXCHANGE BILIARY DRAIN  05/24/2017  . IR EXCHANGE BILIARY DRAIN  07/27/2017  . IR PERC CHOLECYSTOSTOMY  04/10/2017  . JOINT REPLACEMENT    . PANCREATIC STENT PLACEMENT  08/21/2017   Procedure: PANCREATIC STENT PLACEMENT;  Surgeon: Austin Essex, MD;  Location: [place];  Service: Endoscopy;;  . PEG PLACEMENT N/A 09/02/2013   Procedure: PERCUTANEOUS ENDOSCOPIC GASTROSTOMY (PEG) PLACEMENT;  Surgeon: Austin Ober, MD;  Location: [place];  Service: General;  Laterality: N/A;  . PROSTATE CRYOABLATION    . REMOVAL OF STONES  08/21/2017   Procedure: REMOVAL OF STONES;  Surgeon: Austin Essex, MD;  Location: [place];  Service: Endoscopy;;  . Austin [name]  08/21/2017   Procedure: SPHINCTEROTOMY;  Surgeon: Austin Essex, MD;  Location: [facility] ENDOSCOPY;  Service: Endoscopy;;    reports that he has quit smoking. He has never used smokeless tobacco. He reports current alcohol use. He  reports that he does not use drugs. family history includes Arthritis in an other family member; Hypertension in an other family member; Lung cancer in his father. No Known Allergies Current Outpatient Medications on File Prior to Visit  Medication Sig Dispense Refill  . acetaminophen (TYLENOL) 325 MG tablet Take 650 mg by mouth every 8 (eight) hours as needed for moderate pain.     . budesonide-formoterol (SYMBICORT) 160-4.5 MCG/ACT inhaler Inhale 2 puffs into the lungs 2 (two) times daily. 1 Inhaler 11  . cholecalciferol (VITAMIN D) 1000 units tablet Take 2,000 Units by mouth daily.    . Control Gel Formula Dressing (DUODERM CGF DRESSING) MISC Apply 1 each topically daily as needed. 100 each 3  . diclofenac sodium (VOLTAREN) 1 % GEL Apply 2 g topically 4 (four) times daily as needed. 100 g 11  . furosemide (LASIX) 20 MG tablet Take 20 mg by mouth daily.    [name] gabapentin (NEURONTIN) 100 MG capsule TAKE 2 CAPSULES BY MOUTH TWICE DAILY. 120 capsule 2  . gabapentin (NEURONTIN) 300 MG capsule Take 1 capsule (300 mg total) by mouth at bedtime. 90 capsule 3  . omeprazole (PRILOSEC) 40 MG capsule TAKE ONE CAPSULE BY MOUTH ONCE DAILY. 90 capsule 1  . rosuvastatin (CRESTOR) 10 MG tablet Take 1 tablet (10 mg total) by mouth daily. 90 tablet 3  . sodium bicarbonate 650 MG tablet Take 1 tablet (650 mg total) by mouth 2 (two) times daily. 60 tablet 11  . warfarin (COUMADIN) 5 MG tablet Take 1 1/2 tablets daily or as directed 45 tablet 6   No current facility-administered medications on file prior to visit.    Review of Systems  Constitutional: Negative for other unusual diaphoresis or sweats HENT: Negative for ear discharge or swelling Eyes: Negative for other worsening visual disturbances Respiratory: Negative for stridor or other swelling  Gastrointestinal: Negative for worsening distension or other blood Genitourinary: Negative for retention or other urinary change Musculoskeletal: Negative for  other MSK pain or swelling Skin: Negative for color change or other new lesions Neurological: Negative for worsening tremors and other numbness  Psychiatric/Behavioral: Negative for worsening agitation or other fatigue ALl other system neg per pt    Objective:   Physical Exam BP 128/84   Pulse 75   Temp 98.4 F (36.9 C)   Ht 5' 6.5" (1.689 m)   SpO2 95%   BMI 31.80 kg/m  VS noted,  Constitutional: Pt appears in NAD HENT: Head: NCAT.  Right Ear: External ear normal.  Left Ear: External ear normal.  Eyes: . Pupils are equal, round, and reactive to light. Conjunctivae and EOM are normal Nose: without d/c or deformity Neck: Neck supple. Gross normal ROM Cardiovascular: Normal rate and regular rhythm.   Pulmonary/Chest: Effort normal and breath sounds without rales or wheezing.  Abd:  Soft, NT, ND, + BS, no organomegaly Neurological: [name] alert. At baseline orientation, motor grossly intact Skin: Skin is warm. No rashes, other new lesions, no LE edema Psychiatric: Pt behavior is normal without agitation  No other exam findings Lab Results  Component Value Date   WBC 6.3 09/14/2017   HGB 12.4 (L) 09/14/2017   HCT 36.9 (L) 09/14/2017   PLT 194.0 09/14/2017  GLUCOSE 96 09/14/2017   CHOL 165 05/18/2016   TRIG 217.0 (H) 05/18/2016   HDL 35.00 (L) 05/18/2016   LDLDIRECT 84.0 05/18/2016   LDLCALC 110 (H) 04/03/2013   ALT 11 09/14/2017   AST 12 09/14/2017   NA 143 09/14/2017   K 3.9 09/14/2017   CL 108 09/14/2017   CREATININE 1.59 (H) 09/14/2017   BUN 24 (H) 09/14/2017   CO2 29 09/14/2017   TSH 1.73 04/30/2015   PSA 7.27 (H) 04/14/2008   INR 2.6 08/28/2018   HGBA1C 6.0 (H) 09/04/2013        Assessment & Plan:

## 2018-09-26 NOTE — Assessment & Plan Note (Signed)
stable overall by history and exam, recent data reviewed with pt, and pt to continue medical treatment as before,  to f/u any worsening symptoms or concerns, will need replaacement wheelchair

## 2018-09-26 NOTE — Assessment & Plan Note (Signed)
stable overall by history and exam, recent data reviewed with pt, and pt to continue medical treatment as before,  to f/u any worsening symptoms or concerns  

## 2018-10-07 DIAGNOSIS — H1132 Conjunctival hemorrhage, left eye: Secondary | ICD-10-CM | POA: Diagnosis not present

## 2018-10-09 ENCOUNTER — Ambulatory Visit (INDEPENDENT_AMBULATORY_CARE_PROVIDER_SITE_OTHER): Payer: Medicare Other | Admitting: *Deleted

## 2018-10-09 DIAGNOSIS — I480 Paroxysmal atrial fibrillation: Secondary | ICD-10-CM

## 2018-10-09 DIAGNOSIS — Z5181 Encounter for therapeutic drug level monitoring: Secondary | ICD-10-CM

## 2018-10-09 LAB — POCT INR: INR: 1.8 — AB (ref 2.0–3.0)

## 2018-10-09 NOTE — Patient Instructions (Signed)
Take 2 tablets tonight and tomorrow night then resume 1 1/2 tablets daily. Recheck in 6 weeks in [place] office.

## 2018-10-10 DIAGNOSIS — D631 Anemia in chronic kidney disease: Secondary | ICD-10-CM | POA: Diagnosis not present

## 2018-10-10 DIAGNOSIS — N2581 Secondary hyperparathyroidism of renal origin: Secondary | ICD-10-CM | POA: Diagnosis not present

## 2018-10-10 DIAGNOSIS — N184 Chronic kidney disease, stage 4 (severe): Secondary | ICD-10-CM | POA: Diagnosis not present

## 2018-10-10 DIAGNOSIS — G839 Paralytic syndrome, unspecified: Secondary | ICD-10-CM | POA: Diagnosis not present

## 2018-10-10 DIAGNOSIS — I129 Hypertensive chronic kidney disease with stage 1 through stage 4 chronic kidney disease, or unspecified chronic kidney disease: Secondary | ICD-10-CM | POA: Diagnosis not present

## 2018-10-30 DIAGNOSIS — H43813 Vitreous degeneration, bilateral: Secondary | ICD-10-CM | POA: Diagnosis not present

## 2018-10-30 DIAGNOSIS — H40013 Open angle with borderline findings, low risk, bilateral: Secondary | ICD-10-CM | POA: Diagnosis not present

## 2018-10-30 DIAGNOSIS — H4720 Unspecified optic atrophy: Secondary | ICD-10-CM | POA: Diagnosis not present

## 2018-10-30 DIAGNOSIS — H524 Presbyopia: Secondary | ICD-10-CM | POA: Diagnosis not present

## 2018-11-13 ENCOUNTER — Telehealth: Payer: Self-pay | Admitting: Pharmacist

## 2018-11-13 NOTE — Telephone Encounter (Signed)
Called pt to discuss changing from warfarin to [place] due to better efficacy and safety data, as well as less frequent monitoring, especially given COVID-19 pandemic.  Spoke with patient's wife who states pt does not have medication insurance. Will continue warfarin due to cost.

## 2018-11-15 ENCOUNTER — Other Ambulatory Visit: Payer: Self-pay | Admitting: Family Medicine

## 2018-11-20 ENCOUNTER — Ambulatory Visit (INDEPENDENT_AMBULATORY_CARE_PROVIDER_SITE_OTHER): Payer: Medicare Other | Admitting: *Deleted

## 2018-11-20 DIAGNOSIS — I480 Paroxysmal atrial fibrillation: Secondary | ICD-10-CM

## 2018-11-20 DIAGNOSIS — Z5181 Encounter for therapeutic drug level monitoring: Secondary | ICD-10-CM | POA: Diagnosis not present

## 2018-11-20 LAB — POCT INR: INR: 2.5 (ref 2.0–3.0)

## 2018-11-20 NOTE — Patient Instructions (Signed)
Continue warfarin 1 1/2 tablets daily  ?Recheck in 6 weeks in [place] office.   ?

## 2018-12-05 ENCOUNTER — Other Ambulatory Visit: Payer: Self-pay | Admitting: Internal Medicine

## 2018-12-06 NOTE — Telephone Encounter (Signed)
Please refill.

## 2019-01-01 ENCOUNTER — Other Ambulatory Visit: Payer: Self-pay | Admitting: Family Medicine

## 2019-01-01 ENCOUNTER — Ambulatory Visit (INDEPENDENT_AMBULATORY_CARE_PROVIDER_SITE_OTHER): Payer: Medicare Other | Admitting: *Deleted

## 2019-01-01 ENCOUNTER — Other Ambulatory Visit: Payer: Self-pay

## 2019-01-01 DIAGNOSIS — Z5181 Encounter for therapeutic drug level monitoring: Secondary | ICD-10-CM | POA: Diagnosis not present

## 2019-01-01 DIAGNOSIS — I480 Paroxysmal atrial fibrillation: Secondary | ICD-10-CM

## 2019-01-01 LAB — POCT INR: INR: 2.1 (ref 2.0–3.0)

## 2019-01-01 NOTE — Patient Instructions (Signed)
Continue warfarin 1 1/2 tablets daily  ?Recheck in 6 weeks in [place] office.   ?

## 2019-01-02 ENCOUNTER — Other Ambulatory Visit: Payer: Self-pay | Admitting: *Deleted

## 2019-01-02 MED ORDER — WARFARIN SODIUM 5 MG PO TABS: ORAL_TABLET | ORAL | 6 refills | Status: DC

## 2019-01-22 ENCOUNTER — Telehealth: Payer: Self-pay | Admitting: Family Medicine

## 2019-01-22 NOTE — Telephone Encounter (Signed)
Patient's wife called stating that the patient has been using Gabapentin: 2 (182m) in the morning at breakfast, 2 (1012m at dinner, and 1 (30014mat bed time. He is also taking tylenol as need and has been using [place] BID.  He has noticed that it does not seem to be helping his pain. She wanted to know if there was anything else Dr SmiTamala Julianuld recommend (or if patient needs an appointment)?  Please advise.

## 2019-01-23 NOTE — Telephone Encounter (Signed)
If ti does not make him too sedated increase gabapentin to 322m 3 times a day

## 2019-01-27 ENCOUNTER — Other Ambulatory Visit: Payer: Self-pay

## 2019-01-27 MED ORDER — TIZANIDINE HCL 2 MG PO CAPS: 2.0000 mg | ORAL_CAPSULE | Freq: Two times a day (BID) | ORAL | 0 refills | Status: DC

## 2019-01-27 NOTE — Telephone Encounter (Signed)
MM relaxer called in to pharmacy. Patient notified.

## 2019-01-27 NOTE — Telephone Encounter (Signed)
We coulld add a very small dose of a muscle relaxer. Would consider Zanaflex 23m tabs up to 2 times a day but could cause drowsiness.

## 2019-01-27 NOTE — Telephone Encounter (Signed)
Spoke with patient's wife [name]. Patient is using 333m gabapentin TID currently per the patient's wife. Patient's left leg is hurting from the thigh to the foot. Is using Tylenol for Arthritis as well for pain. Would like to know what other recommendations there are to alleviate his pain.

## 2019-01-30 ENCOUNTER — Other Ambulatory Visit: Payer: Self-pay | Admitting: Family Medicine

## 2019-02-07 ENCOUNTER — Encounter: Payer: Self-pay | Admitting: Internal Medicine

## 2019-02-07 ENCOUNTER — Other Ambulatory Visit: Payer: Self-pay

## 2019-02-07 ENCOUNTER — Ambulatory Visit (INDEPENDENT_AMBULATORY_CARE_PROVIDER_SITE_OTHER): Payer: Medicare Other | Admitting: Internal Medicine

## 2019-02-07 VITALS — BP 120/78 | HR 73 | Temp 98.4°F | Ht 66.5 in

## 2019-02-07 DIAGNOSIS — I10 Essential (primary) hypertension: Secondary | ICD-10-CM | POA: Diagnosis not present

## 2019-02-07 DIAGNOSIS — A09 Infectious gastroenteritis and colitis, unspecified: Secondary | ICD-10-CM | POA: Diagnosis not present

## 2019-02-07 DIAGNOSIS — E611 Iron deficiency: Secondary | ICD-10-CM

## 2019-02-07 DIAGNOSIS — E782 Mixed hyperlipidemia: Secondary | ICD-10-CM | POA: Diagnosis not present

## 2019-02-07 DIAGNOSIS — E538 Deficiency of other specified B group vitamins: Secondary | ICD-10-CM

## 2019-02-07 DIAGNOSIS — G8114 Spastic hemiplegia affecting left nondominant side: Secondary | ICD-10-CM

## 2019-02-07 DIAGNOSIS — N183 Chronic kidney disease, stage 3 unspecified: Secondary | ICD-10-CM | POA: Diagnosis not present

## 2019-02-07 DIAGNOSIS — E559 Vitamin D deficiency, unspecified: Secondary | ICD-10-CM

## 2019-02-07 DIAGNOSIS — I639 Cerebral infarction, unspecified: Secondary | ICD-10-CM

## 2019-02-07 LAB — URINALYSIS, ROUTINE W REFLEX MICROSCOPIC
Bilirubin Urine: NEGATIVE
Ketones, ur: NEGATIVE
Nitrite: NEGATIVE
RBC / HPF: NONE SEEN (ref 0–?)
Specific Gravity, Urine: 1.015 (ref 1.000–1.030)
Total Protein, Urine: NEGATIVE
Urine Glucose: NEGATIVE
Urobilinogen, UA: 1 (ref 0.0–1.0)
pH: 7 (ref 5.0–8.0)

## 2019-02-07 LAB — CBC WITH DIFFERENTIAL/PLATELET
Basophils Absolute: 0.1 10*3/uL (ref 0.0–0.1)
Basophils Relative: 0.8 % (ref 0.0–3.0)
Eosinophils Absolute: 0.2 10*3/uL (ref 0.0–0.7)
Eosinophils Relative: 2 % (ref 0.0–5.0)
HCT: 41.1 % (ref 39.0–52.0)
Hemoglobin: 13.5 g/dL (ref 13.0–17.0)
Lymphocytes Relative: 17.8 % (ref 12.0–46.0)
Lymphs Abs: 1.4 10*3/uL (ref 0.7–4.0)
MCHC: 32.9 g/dL (ref 30.0–36.0)
MCV: 93.2 fl (ref 78.0–100.0)
Monocytes Absolute: 0.9 10*3/uL (ref 0.1–1.0)
Monocytes Relative: 11.4 % (ref 3.0–12.0)
Neutro Abs: 5.3 10*3/uL (ref 1.4–7.7)
Neutrophils Relative %: 68 % (ref 43.0–77.0)
Platelets: 172 10*3/uL (ref 150.0–400.0)
RBC: 4.41 Mil/uL (ref 4.22–5.81)
RDW: 14.5 % (ref 11.5–15.5)
WBC: 7.9 10*3/uL (ref 4.0–10.5)

## 2019-02-07 LAB — HEPATIC FUNCTION PANEL
ALT: 15 U/L (ref 0–53)
AST: 16 U/L (ref 0–37)
Albumin: 4 g/dL (ref 3.5–5.2)
Alkaline Phosphatase: 77 U/L (ref 39–117)
Bilirubin, Direct: 0.1 mg/dL (ref 0.0–0.3)
Total Bilirubin: 0.5 mg/dL (ref 0.2–1.2)
Total Protein: 6.9 g/dL (ref 6.0–8.3)

## 2019-02-07 LAB — BASIC METABOLIC PANEL
BUN: 23 mg/dL (ref 6–23)
CO2: 26 mEq/L (ref 19–32)
Calcium: 9.2 mg/dL (ref 8.4–10.5)
Chloride: 106 mEq/L (ref 96–112)
Creatinine, Ser: 1.6 mg/dL — ABNORMAL HIGH (ref 0.40–1.50)
GFR: 41.38 mL/min — ABNORMAL LOW (ref >60.00)
Glucose, Bld: 85 mg/dL (ref 70–99)
Potassium: 4.3 mEq/L (ref 3.5–5.1)
Sodium: 141 mEq/L (ref 135–145)

## 2019-02-07 LAB — LIPID PANEL
Cholesterol: 156 mg/dL (ref 0–200)
HDL: 36.4 mg/dL — ABNORMAL LOW (ref >39.00)
NonHDL: 119.98
Total CHOL/HDL Ratio: 4
Triglycerides: 279 mg/dL — ABNORMAL HIGH (ref 0.0–149.0)
VLDL: 55.8 mg/dL — ABNORMAL HIGH (ref 0.0–40.0)

## 2019-02-07 LAB — LDL CHOLESTEROL, DIRECT: Direct LDL: 82 mg/dL

## 2019-02-07 LAB — IBC PANEL
Iron: 70 ug/dL (ref 42–165)
Saturation Ratios: 31.6 % (ref 20.0–50.0)
Transferrin: 158 mg/dL — ABNORMAL LOW (ref 212.0–360.0)

## 2019-02-07 LAB — VITAMIN B12: Vitamin B-12: 199 pg/mL — ABNORMAL LOW (ref 211–911)

## 2019-02-07 LAB — VITAMIN D 25 HYDROXY (VIT D DEFICIENCY, FRACTURES): VITD: 49.54 ng/mL (ref 30.00–100.00)

## 2019-02-07 LAB — TSH: TSH: 1.98 u[IU]/mL (ref 0.35–4.50)

## 2019-02-07 NOTE — Patient Instructions (Signed)
Ok to try off the omeprazole to see if the loose stools get better; if so we can change to pepcid  You will be contacted regarding the referral for: [place] with PT  Please continue all other medications as before, and refills have been done if requested.  Please have the pharmacy call with any other refills you may need.  Please continue your efforts at being more active, low cholesterol diet, and weight control.  You are otherwise up to date with prevention measures today.  Please keep your appointments with your specialists as you may have planned  You are given the Hospital Bed order  Please go to the LAB at the blood drawing area for the tests to be done  You will be contacted by phone if any changes need to be made immediately.  Otherwise, you will receive a letter about your results with an explanation, but please check with MyChart first.  Please remember to sign up for MyChart if you have not done so, as this will be important to you in the future with finding out test results, communicating by private email, and scheduling acute appointments online when needed.  Please make an Appointment to return in 6 months, or sooner if needed

## 2019-02-07 NOTE — Progress Notes (Signed)
Subjective:    Patient ID: Austin [name], male    DOB: Jul 20, 1932, 86 y.o.   MRN: [id number]  HPI  Here with paid caregiver for support from [facility], c/o recurrent loose stool x 4-5 mo but Denies worsening reflux, abd pain, dysphagia, n/v, constipation or blood.  Also has recurring left leg cramps at night for unclear reasons except has been struggling with standing recently. Has become overall less capable with standing and helping with ADLs,  Asks for hosp bed.  Last PT finished Aug 2020.  Pt denies new neurological symptoms such as new headache, or facial or extremity weakness or numbness  Pt denies chest pain, increased sob or doe, wheezing, orthopnea, PND, increased LE swelling, palpitations, dizziness or syncope.   Pt denies polydipsia, polyuria .pcghx Current Outpatient Medications on File Prior to Visit  Medication Sig Dispense Refill  . acetaminophen (TYLENOL) 325 MG tablet Take 650 mg by mouth every 8 (eight) hours as needed for moderate pain.     . budesonide-formoterol (SYMBICORT) 160-4.5 MCG/ACT inhaler Inhale 2 puffs into the lungs 2 (two) times daily. 1 Inhaler 11  . cholecalciferol (VITAMIN D) 1000 units tablet Take 2,000 Units by mouth daily.    . Control Gel Formula Dressing (DUODERM CGF DRESSING) MISC Apply 1 each topically daily as needed. 100 each 3  . diclofenac sodium (VOLTAREN) 1 % GEL Apply 2 g topically 4 (four) times daily as needed. 100 g 11  . furosemide (LASIX) 20 MG tablet Take 20 mg by mouth daily.    [name] gabapentin (NEURONTIN) 100 MG capsule TAKE 2 CAPSULES BY MOUTH TWICE DAILY. 180 capsule 0  . gabapentin (NEURONTIN) 300 MG capsule TAKE 1 CAPSULE BY MOUTH AT BEDTIME. 90 capsule 0  . omeprazole (PRILOSEC) 40 MG capsule TAKE ONE CAPSULE BY MOUTH ONCE DAILY. 90 capsule 1  . rosuvastatin (CRESTOR) 10 MG tablet Take 1 tablet (10 mg total) by mouth daily. 90 tablet 3  . sodium bicarbonate 650 MG tablet Take 1 tablet (650 mg total) by mouth 2 (two) times  daily. 60 tablet 11  . tizanidine (ZANAFLEX) 2 MG capsule Take 1 capsule (2 mg total) by mouth 2 (two) times daily. 60 capsule 0  . warfarin (COUMADIN) 5 MG tablet TAKE 1 & 1/2 TABLETS BY MOUTH DAILY OR AS DIRECTED 45 tablet 6   No current facility-administered medications on file prior to visit.   Review of Systems  Constitutional: Negative for other unusual diaphoresis or sweats HENT: Negative for ear discharge or swelling Eyes: Negative for other worsening visual disturbances Respiratory: Negative for stridor or other swelling  Gastrointestinal: Negative for worsening distension or other blood Genitourinary: Negative for retention or other urinary change Musculoskeletal: Negative for other MSK pain or swelling Skin: Negative for color change or other new lesions Neurological: Negative for worsening tremors and other numbness  Psychiatric/Behavioral: Negative for worsening agitation or other fatigue All otherwise neg per pt     Objective:   Physical Exam BP 120/78   Pulse 73   Temp 98.4 F (36.9 C) (Oral)   Ht 5' 6.5" (1.689 m)   SpO2 97%   BMI 31.80 kg/m  VS noted, non toxic Constitutional: Pt appears in NAD HENT: Head: NCAT.  Right Ear: External ear normal.  Left Ear: External ear normal.  Eyes: . Pupils are equal, round, and reactive to light. Conjunctivae and EOM are normal Nose: without d/c or deformity Neck: Neck supple. Gross normal ROM Cardiovascular: Normal rate and regular  rhythm.   Pulmonary/Chest: Effort normal and breath sounds without rales or wheezing.  Abd:  Soft, NT, ND, + BS, no organomegaly Neurological: [name] alert. At baseline orientation, Skin: Skin is warm. No rashes, other new lesions, no LE edema Psychiatric: Pt behavior is normal without agitation  All otherwise neg per pt Lab Results  Component Value Date   WBC 7.9 02/07/2019   HGB 13.5 02/07/2019   HCT 41.1 02/07/2019   PLT 172.0 02/07/2019   GLUCOSE 85 02/07/2019   CHOL 156 02/07/2019    TRIG 279.0 (H) 02/07/2019   HDL 36.40 (L) 02/07/2019   LDLDIRECT 82.0 02/07/2019   LDLCALC 110 (H) 04/03/2013   ALT 15 02/07/2019   AST 16 02/07/2019   NA 141 02/07/2019   K 4.3 02/07/2019   CL 106 02/07/2019   CREATININE 1.60 (H) 02/07/2019   BUN 23 02/07/2019   CO2 26 02/07/2019   TSH 1.98 02/07/2019   PSA 7.27 (H) 04/14/2008   INR 2.1 01/01/2019   HGBA1C 6.0 (H) 09/04/2013          Assessment & Plan:

## 2019-02-08 ENCOUNTER — Encounter: Payer: Self-pay | Admitting: Internal Medicine

## 2019-02-08 NOTE — Assessment & Plan Note (Signed)
stable overall by history and exam, recent data reviewed with pt, and pt to continue medical treatment as before,  to f/u any worsening symptoms or concerns  

## 2019-02-08 NOTE — Assessment & Plan Note (Addendum)
Etiology unclear, likely functional, for trial off prilosec for several days, consider change to pepcid, ok for immodium prn, for labs today  Note:  Total time for pt hx, exam, review of record with pt in the room, determination of diagnoses and plan for further eval and tx is > 40 min, with over 50% spent in coordination and counseling of patient including the differential dx, tx, further evaluation and other management of diarhea, left spastic hemiparesis, HLD, HTN, CKD,

## 2019-02-08 NOTE — Assessment & Plan Note (Addendum)
stable overall by history and exam, recent data reviewed with pt, and pt to continue medical treatment as before,  to f/u any worsening symptoms or concerns, also for [facility] with PT restart

## 2019-02-09 ENCOUNTER — Other Ambulatory Visit: Payer: Self-pay | Admitting: Internal Medicine

## 2019-02-09 ENCOUNTER — Encounter: Payer: Self-pay | Admitting: Internal Medicine

## 2019-02-09 MED ORDER — VITAMIN B-12 1000 MCG PO TABS: 1000.0000 ug | ORAL_TABLET | Freq: Every day | ORAL | 3 refills | Status: AC

## 2019-02-12 ENCOUNTER — Ambulatory Visit (INDEPENDENT_AMBULATORY_CARE_PROVIDER_SITE_OTHER): Payer: Medicare Other | Admitting: *Deleted

## 2019-02-12 ENCOUNTER — Telehealth: Payer: Self-pay | Admitting: Internal Medicine

## 2019-02-12 ENCOUNTER — Other Ambulatory Visit: Payer: Self-pay

## 2019-02-12 DIAGNOSIS — I480 Paroxysmal atrial fibrillation: Secondary | ICD-10-CM | POA: Diagnosis not present

## 2019-02-12 DIAGNOSIS — Z5181 Encounter for therapeutic drug level monitoring: Secondary | ICD-10-CM | POA: Diagnosis not present

## 2019-02-12 LAB — POCT INR: INR: 1.8 — AB (ref 2.0–3.0)

## 2019-02-12 NOTE — Patient Instructions (Signed)
Increase warfarin to 1 1/2 tablets daily except 2 tablets on Wednesdays and Saturdays Recheck in 4 weeks in [place] office.

## 2019-02-12 NOTE — Telephone Encounter (Signed)
Amy w/[facility] [phone] wanted the provider to know that she talked to the patient's spouse and she said she was going to decline having a physical therapist because the patient never does what they tell him to do and she didn't want to waste their time.

## 2019-02-15 ENCOUNTER — Other Ambulatory Visit: Payer: Self-pay | Admitting: Family Medicine

## 2019-02-25 ENCOUNTER — Other Ambulatory Visit: Payer: Self-pay | Admitting: Family Medicine

## 2019-02-25 ENCOUNTER — Other Ambulatory Visit: Payer: Self-pay | Admitting: Internal Medicine

## 2019-02-25 NOTE — Telephone Encounter (Signed)
Please refill as per office routine med refill policy (all routine meds refilled for 3 mo or monthly per pt preference up to one year from last visit, then month to month grace period for 3 mo, then further med refills will have to be denied)  

## 2019-02-26 IMAGING — CT CT ABD-PELV W/O CM
2 of 4 series · 14 of 46 positions shown, 16 images · non-contrast
Comparison: 04/10/2017 right upper quadrant ultrasound. 08/15/2013
CT.

CLINICAL DATA: 84-year-old male with fever, nausea and vomiting for
2 days. Elevated white count and lipase. Subsequent encounter.

EXAM:
CT ABDOMEN AND PELVIS WITHOUT CONTRAST
TECHNIQUE: Multidetector CT imaging of the abdomen and pelvis was performed
following the standard protocol without IV contrast.

[Series 3: ap without · axial · non-contrast · 0.84mm/px · z∈[+646,+1036]mm · 11 of 90 slices shown, 13 images]
[im 8/90  soft-tissue]
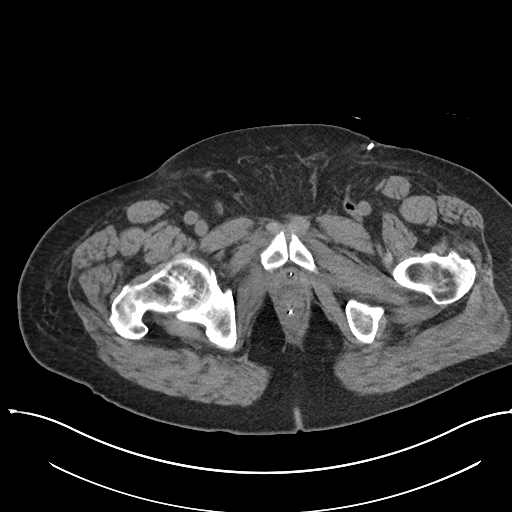
[im 8/90  bone]
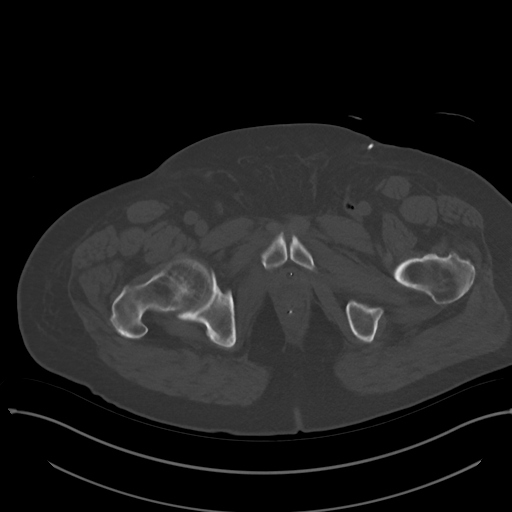
[im 16/90  soft-tissue]
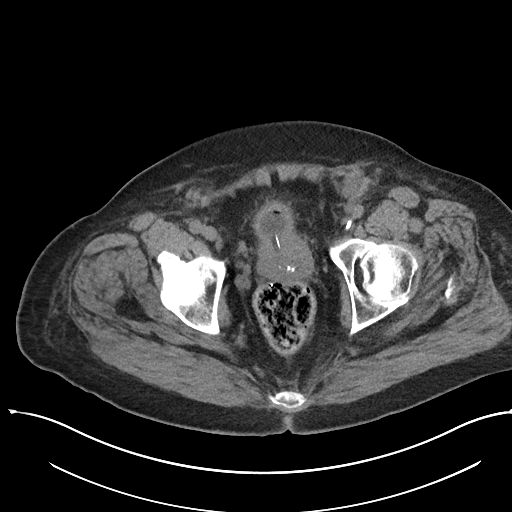
[im 24/90  soft-tissue]
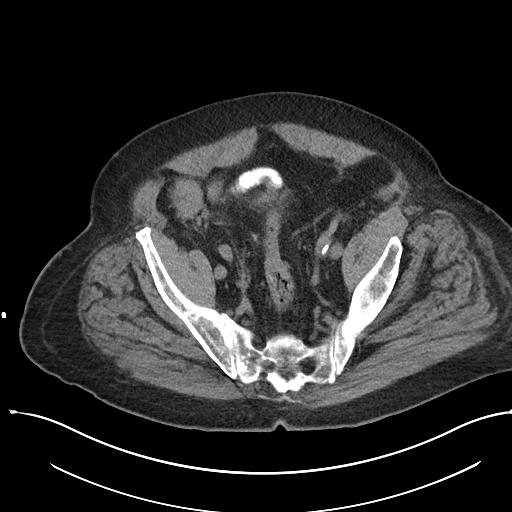
[im 31/90  soft-tissue]
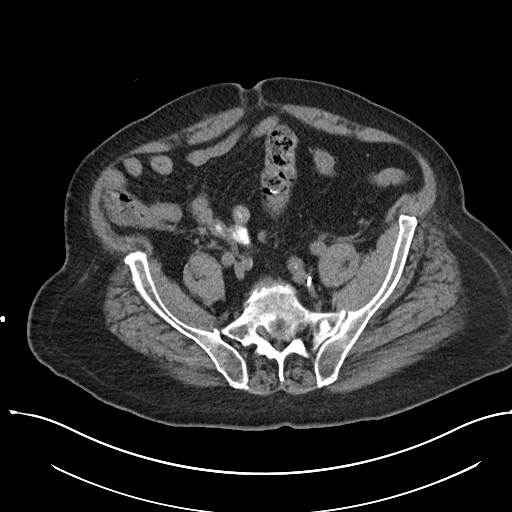
[im 39/90  soft-tissue]
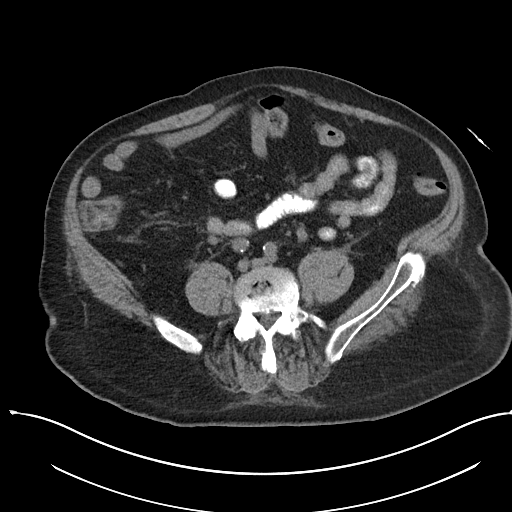
[im 47/90  soft-tissue]
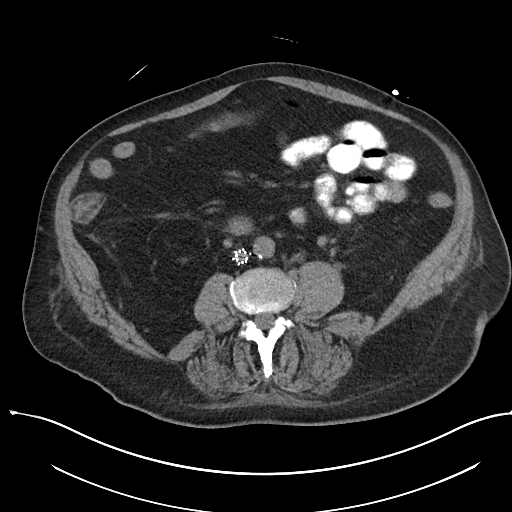
[im 55/90  soft-tissue]
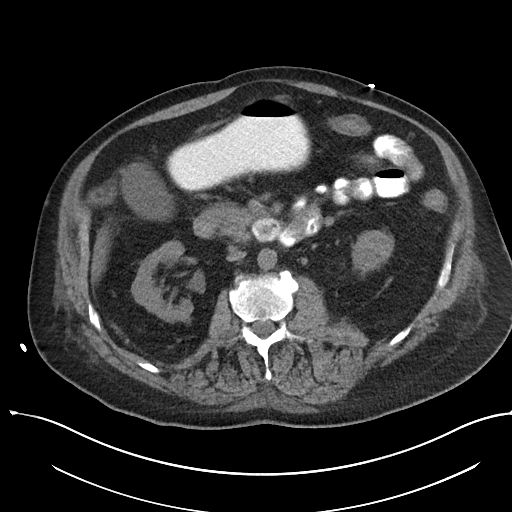
[im 62/90  soft-tissue]
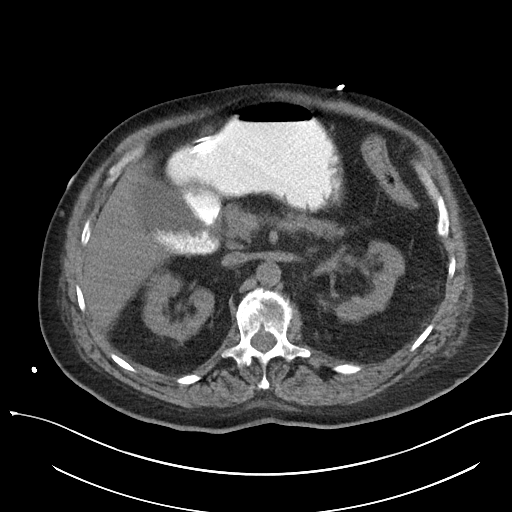
[im 70/90  soft-tissue]
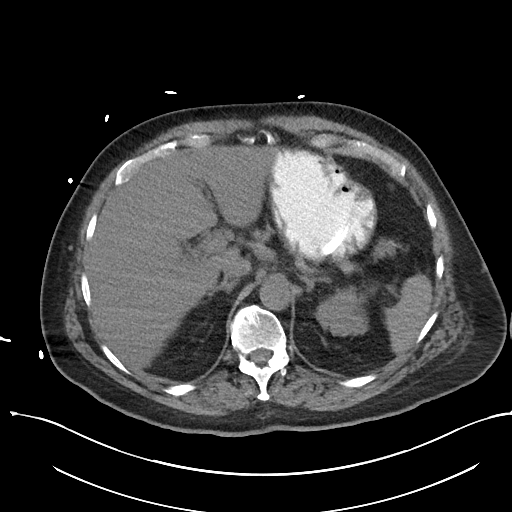
[im 70/90  bone]
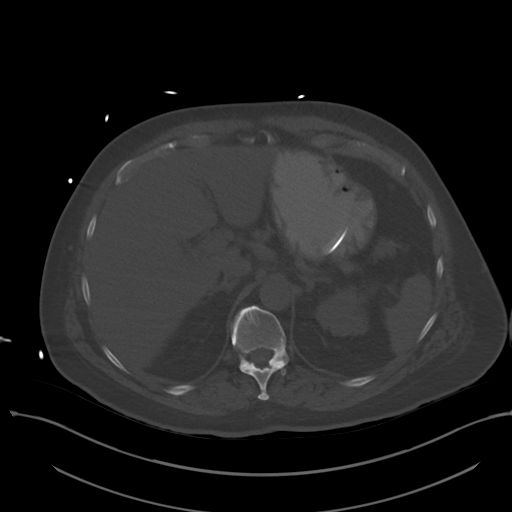
[im 78/90  soft-tissue]
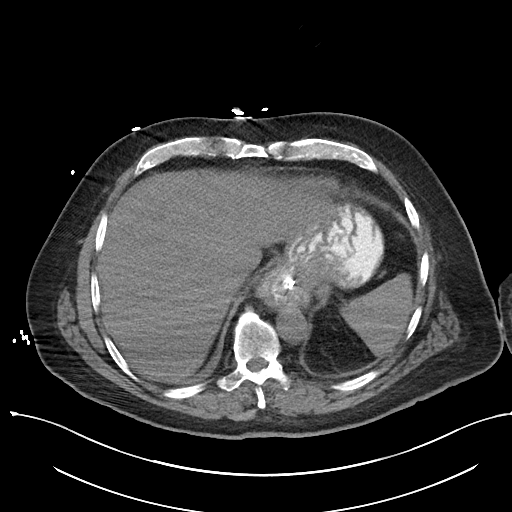
[im 86/90  soft-tissue]
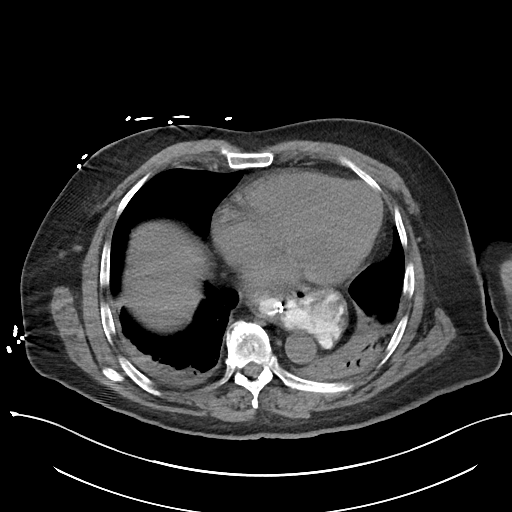

[Series 6: cor · coronal · 0.91mm/px · 3 of 112 slices shown]
[im 38/112  soft-tissue]
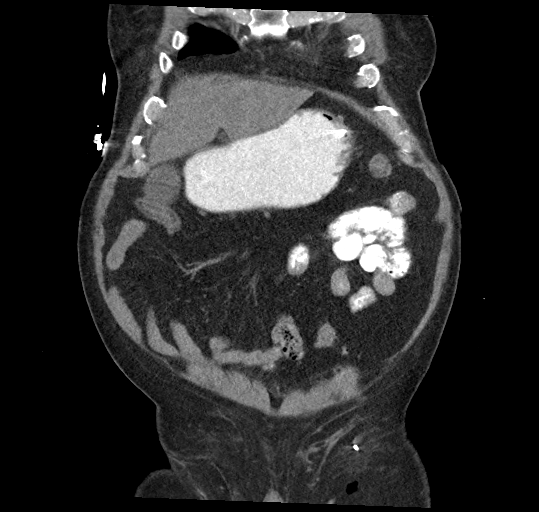
[im 50/112  soft-tissue]
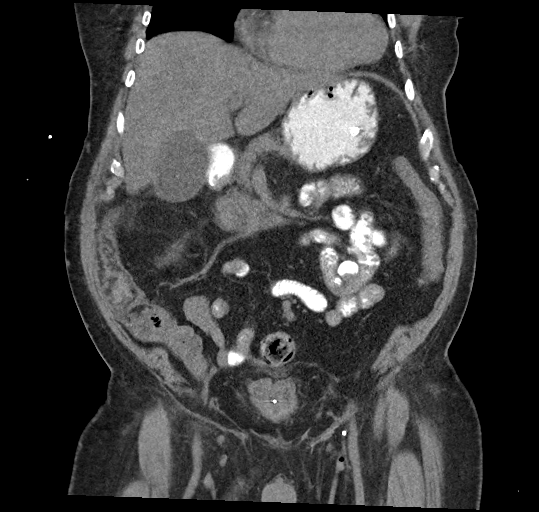
[im 62/112  soft-tissue]
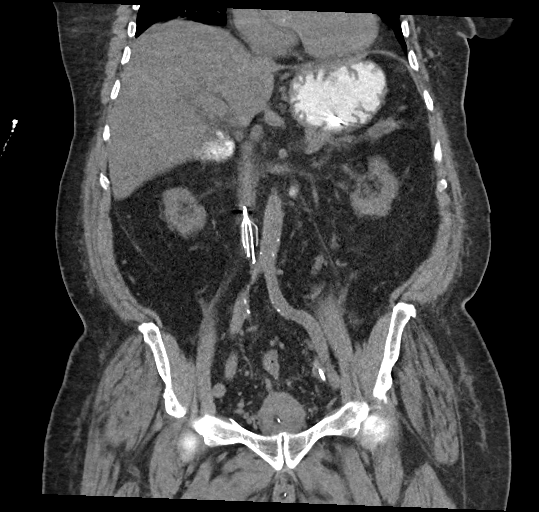

[14 of 46 positions shown; findings below may reference images not displayed]

FINDINGS: Exam is motion degraded.

Lower chest: Mild bibasilar consolidation greater on left may
represent atelectasis or infiltrate. Minimal adjacent pleural fluid.

Mild cardiomegaly.

Hepatobiliary: Fatty liver. Taking into account limitation by non
contrast imaging, no worrisome hepatic lesion.

Multiple small gallstones with dilated gallbladder. Gallbladder wall
thickening noted on recent ultrasound. Clinical correlation
recommended to determine if this may represent cholecystitis.

No calcified common bile duct stone noted.

Pancreas: Evaluation limited by motion lack of contrast. No
worrisome pancreatic lesion or obvious pancreatic inflammation.

Spleen: Taking into account limitation by non contrast imaging, no
splenic lesion or enlargement.

Adrenals/Urinary Tract: No obstructing stone or hydronephrosis.
Taking into account limitation by non contrast imaging, no obvious
renal or adrenal mass.

Foley catheter in place with decompressed urinary bladder. Minimal
haziness of fat planes surrounding the dome of the urinary bladder.
Result of mild cystitis therefore possible.

Stomach/Bowel: Moderate stool rectosigmoid region. Rectal
catheter/tube in place.

Scattered diverticula sigmoid and descending colon without
surrounding inflammation.

Majority of the colon is under distended. No extraluminal bowel
inflammatory process.

Moderately large hiatal hernia. Nasogastric tube tip gastric antrum.

Narrowing of the junction of the duodenal bulb and second portion of
the duodenum possibly physiologic. Multiple duodenal diverticulum
without extraluminal inflammatory process noted.

Vascular/Lymphatic: Left femoral line tip outside the left iliac
vein. Tiny amount of gas at the entrance site of the femoral
catheter and mild haziness of surrounding fat planes which may be
related to installation of catheter.

Mild atherosclerotic changes abdominal aorta without aneurysm.

Inferior vena cava filter is in place.

Slightly enlarged left periaortic lymph node (series 3, image 43)
which short axis dimension 10.9 mm. Significance indeterminate.

Reproductive: Slightly asymmetric partially calcified prostate
gland. Patient has had prior cryoablation per history.

Other: No free intraperitoneal air.  No bowel containing hernia.

Musculoskeletal: Degenerative changes L4-5 and L5-S1. Confluent
osteophyte lower thoracic and upper lumbar spine. Right hip joint
degenerative changes.

Small sclerotic focus left ilium possibly bone island. Tiny
sclerotic metastatic focus secondary consideration.
IMPRESSION: Left femoral line tip outside the left iliac vein.

Multiple small gallstones with dilated gallbladder. Gallbladder wall
thickening noted on recent ultrasound. Clinical correlation
recommended to determine if this may represent cholecystitis.

Foley catheter in place with decompressed urinary bladder. Minimal
haziness of fat planes surrounding the dome of the urinary bladder.
Result of mild cystitis therefore possible.

Otherwise no primary abdominal or pelvic inflammatory process
identified. Evaluation slightly limited by motion degradation and
lack of contrast.

Mild bibasilar consolidation greater on left may represent
atelectasis or infiltrate. Minimal adjacent pleural fluid.

Majority of colon under distended with scattered diverticula and
stool within the rectosigmoid region. No extraluminal bowel
inflammatory process.

Moderately large hiatal hernia. Nasogastric tube tip gastric antrum.

Narrowing of the junction of the duodenal bulb and second portion of
the duodenum possibly physiologic. Multiple duodenal diverticulum
without extraluminal inflammatory process noted.

Fatty liver.

Aortic Atherosclerosis (821WX-TW5.5).

Inferior vena cava filter in place.

These results were called by telephone at the time of interpretation
on 04/10/2017 at [DATE] to Aguilson Francelina patient's nurse, who
verbally acknowledged these results.

## 2019-02-26 IMAGING — XA IR BILIARY DRAIN PLACEMENT W/ CHOLANGIOGRAM
1 series · 2 of 2 positions shown · non-contrast
Comparison: CT abdomen pelvis;

INDICATION: Admitted with septic shock secondary to acute cholecystitis. Poor
surgical candidate.

EXAM:
ULTRASOUND AND FLUOROSCOPIC-GUIDED CHOLECYSTOSTOMY TUBE PLACEMENT

[Series 300: dsa body · 2 of 2 slices shown]
[im 1/2]
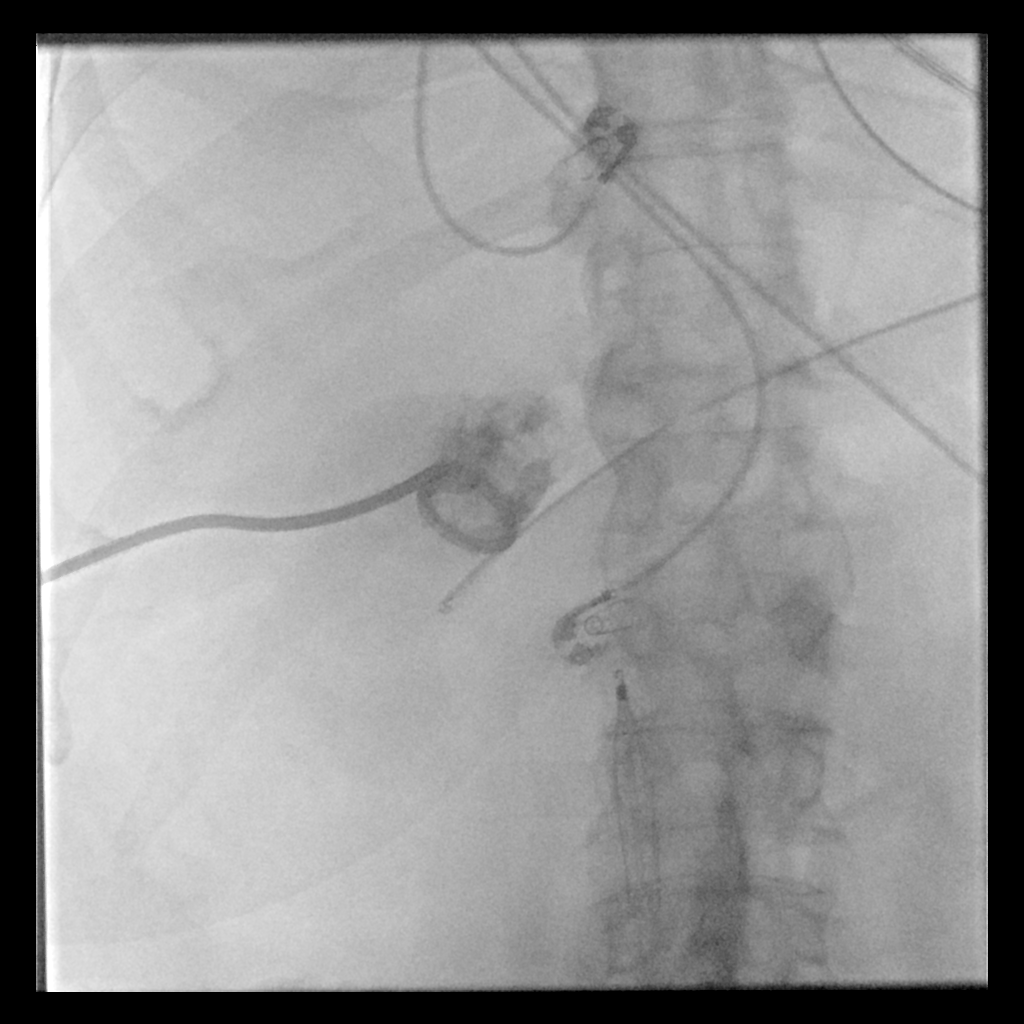
[im 2/2]
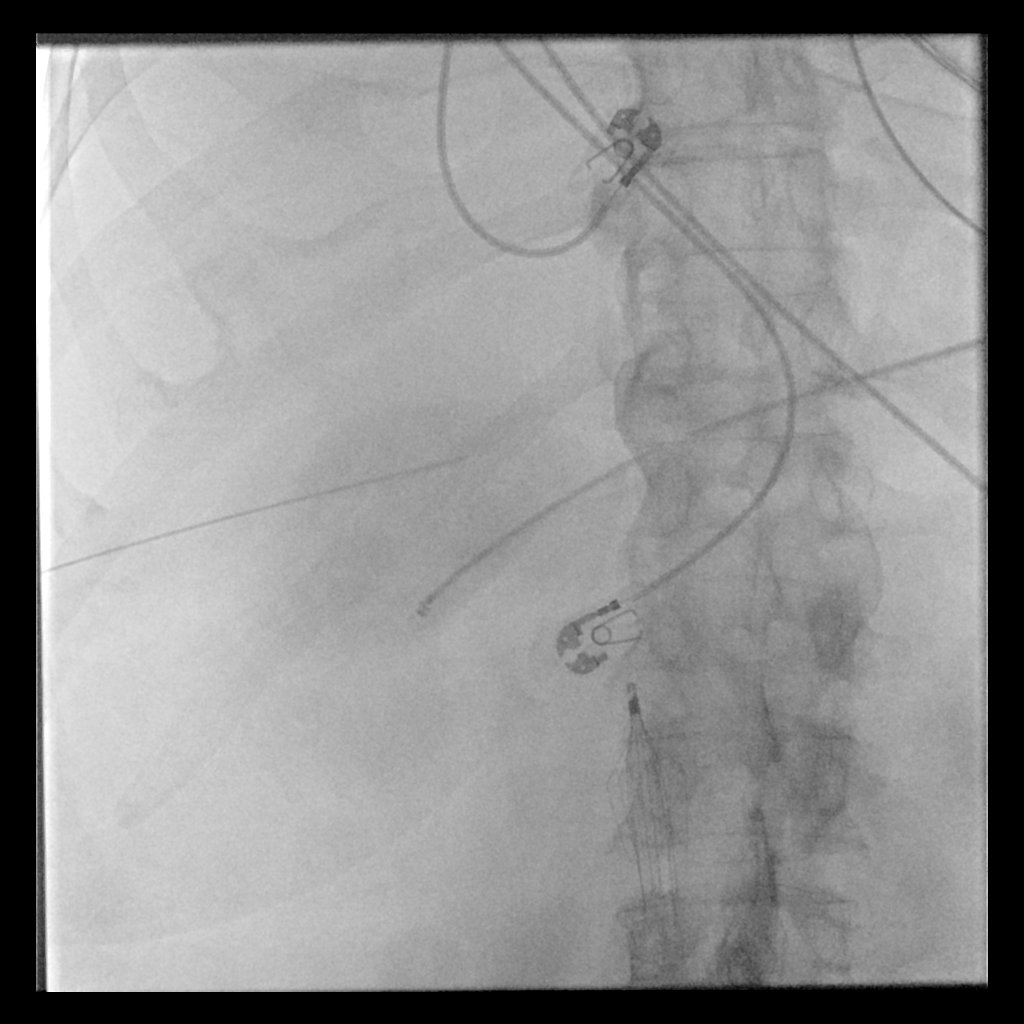

[2 of 2 positions shown; findings below may reference images not displayed]

right upper quadrant abdominal
ultrasound - 04/10/2017

MEDICATIONS:
The patient is currently admitted to the hospital and on intravenous
antibiotics. Antibiotics were administered within an appropriate
time frame prior to skin puncture.

ANESTHESIA/SEDATION:
Moderate (conscious) sedation was employed during this procedure. A
total of Versed 2 mg was administered intravenously.

Moderate Sedation Time: 10 minutes. The patient's level of
consciousness and vital signs were monitored continuously by
radiology nursing throughout the procedure under my direct
supervision.

CONTRAST:  10 cc [id number] - administered into the gallbladder
fossa.

FLUOROSCOPY TIME:  30 seconds (5 mGy)

COMPLICATIONS:
None immediate.

PROCEDURE:
Informed written consent was obtained from the patient after a
discussion of the risks, benefits and alternatives to treatment.
Questions regarding the procedure were encouraged and answered. A
timeout was performed prior to the initiation of the procedure.

The right upper abdominal quadrant was prepped and draped in the
usual sterile fashion, and a sterile drape was applied covering the
operative field. Maximum barrier sterile technique with sterile
gowns and gloves were used for the procedure. A timeout was
performed prior to the initiation of the procedure. Local anesthesia
was provided with 1% lidocaine with epinephrine.

Ultrasound scanning of the right upper quadrant demonstrates a
markedly dilated gallbladder with echogenic shadowing gallstones.
Utilizing a transhepatic approach, a 22 gauge needle was advanced
into the gallbladder under direct ultrasound guidance. An ultrasound
image was saved for documentation purposes. Appropriate intraluminal
puncture was confirmed with the efflux of bile and advancement of an
0.018 wire into the gallbladder lumen. The needle was exchanged for
an Accustick set. A small amount of contrast was injected to confirm
appropriate intraluminal positioning. Over a Benson wire, a
10.2-Tatyana Ro cholecystomy tube was advanced into the gallbladder
fossa, coiled and locked. Bile was aspirated and a small amount of
contrast was injected as several post procedural spot radiographic
images were obtained in various obliquities. The catheter was
secured to the skin with suture, connected to a drainage bag and a
dressing was placed. The patient tolerated the procedure well
without immediate post procedural complication.
IMPRESSION: Successful ultrasound and fluoroscopic guided placement of a
French cholecystostomy tube.

## 2019-02-27 DIAGNOSIS — N184 Chronic kidney disease, stage 4 (severe): Secondary | ICD-10-CM | POA: Diagnosis not present

## 2019-02-27 DIAGNOSIS — N2581 Secondary hyperparathyroidism of renal origin: Secondary | ICD-10-CM | POA: Diagnosis not present

## 2019-02-27 DIAGNOSIS — I129 Hypertensive chronic kidney disease with stage 1 through stage 4 chronic kidney disease, or unspecified chronic kidney disease: Secondary | ICD-10-CM | POA: Diagnosis not present

## 2019-02-27 DIAGNOSIS — G839 Paralytic syndrome, unspecified: Secondary | ICD-10-CM | POA: Diagnosis not present

## 2019-02-27 DIAGNOSIS — D631 Anemia in chronic kidney disease: Secondary | ICD-10-CM | POA: Diagnosis not present

## 2019-02-27 DIAGNOSIS — N189 Chronic kidney disease, unspecified: Secondary | ICD-10-CM | POA: Diagnosis not present

## 2019-02-27 IMAGING — DX DG CHEST 1V PORT
1 series · 1 of 1 positions shown · non-contrast
Comparison: Portable chest x-ray April 09, 2016

CLINICAL DATA: Intubated patient, septic shock, prostate
malignancy, former smoker.

EXAM:
PORTABLE CHEST 1 VIEW

[chest ap]
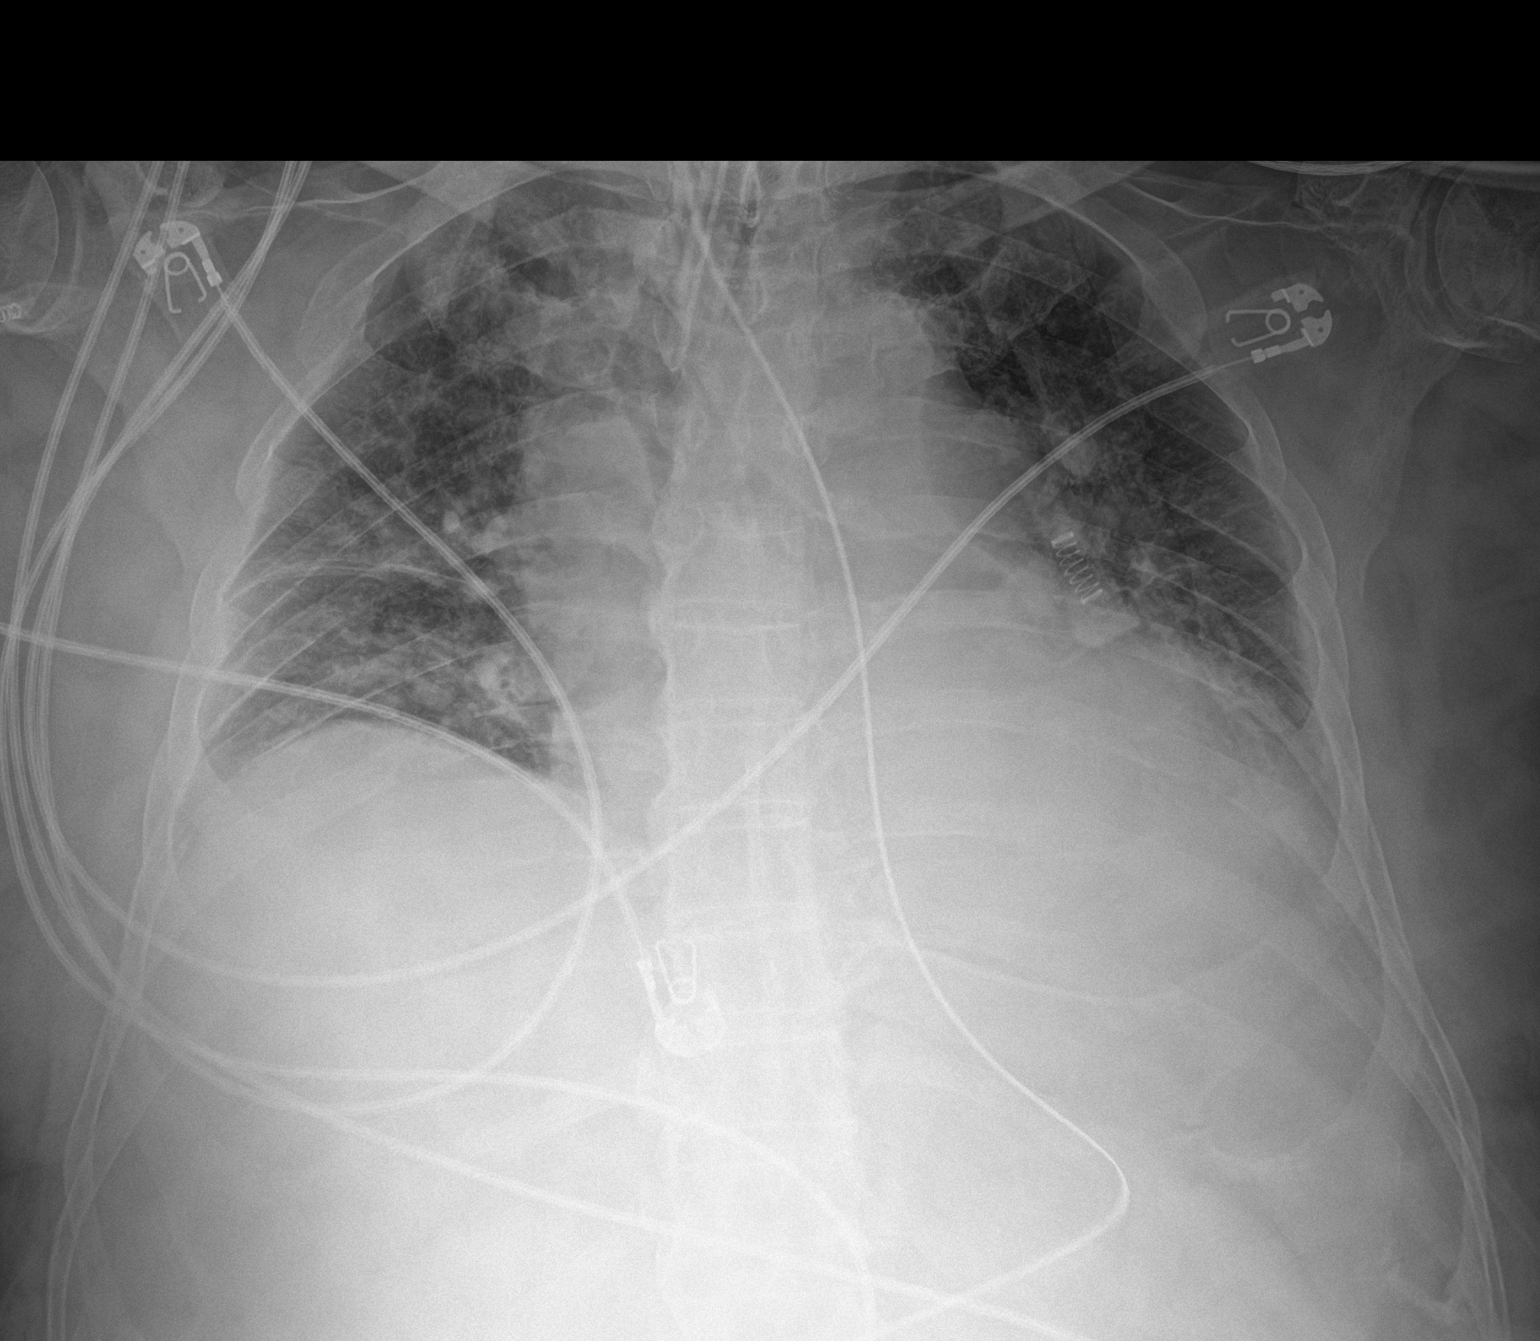

[1 of 1 positions shown; findings below may reference images not displayed]

FINDINGS: The lungs are hypoinflated today. The endotracheal tube tip lies 3
cm above the carina. The interstitial markings of both lungs are
coarse especially in the mid and lower lungs. The left hemidiaphragm
is now obscured and the left retrocardiac region dense. The cardiac
silhouette is enlarged. The pulmonary vascularity is not clearly
engorged. The esophagogastric tube tip in proximal port project
below the inferior margin of the image.
IMPRESSION: Bilateral hypoinflation. Left lower lobe atelectasis or pneumonia.
The endotracheal tube is in reasonable position radiographically.

## 2019-03-01 ENCOUNTER — Other Ambulatory Visit: Payer: Self-pay

## 2019-03-01 ENCOUNTER — Ambulatory Visit: Payer: Medicare Other | Attending: Internal Medicine

## 2019-03-01 DIAGNOSIS — Z23 Encounter for immunization: Secondary | ICD-10-CM

## 2019-03-04 ENCOUNTER — Telehealth: Payer: Self-pay | Admitting: Internal Medicine

## 2019-03-04 NOTE — Telephone Encounter (Signed)
Reprint rx for hospital bed (go to "other orders" tab in chart review). Ask PCP to sign and refax with cover letter to fax number provided.

## 2019-03-04 NOTE — Telephone Encounter (Signed)
States they received a faxed rx for a hospital bed but the fax came through unclear and they need additional information.  Is requesting a call back in regard.

## 2019-03-05 NOTE — Telephone Encounter (Signed)
F/u   [facility] does not have any hospital bed in stock that's  cover by insurance due to [place].   Suggestion:  [facility] - patient receive a bed in  2015.

## 2019-03-06 NOTE — Telephone Encounter (Signed)
DME message sent to Erin Hearing and Anderson Malta with [facility] now [facility]

## 2019-03-07 ENCOUNTER — Encounter: Payer: Self-pay | Admitting: Internal Medicine

## 2019-03-07 ENCOUNTER — Ambulatory Visit (INDEPENDENT_AMBULATORY_CARE_PROVIDER_SITE_OTHER): Payer: Medicare Other | Admitting: Internal Medicine

## 2019-03-07 DIAGNOSIS — R197 Diarrhea, unspecified: Secondary | ICD-10-CM | POA: Diagnosis not present

## 2019-03-07 MED ORDER — DIPHENOXYLATE-ATROPINE 2.5-0.025 MG PO TABS: 1.0000 | ORAL_TABLET | Freq: Four times a day (QID) | ORAL | 1 refills | Status: DC | PRN
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
## 2019-03-07 NOTE — Patient Instructions (Signed)
See notes

## 2019-03-07 NOTE — Assessment & Plan Note (Signed)
See notes

## 2019-03-12 ENCOUNTER — Other Ambulatory Visit: Payer: Self-pay

## 2019-03-12 ENCOUNTER — Ambulatory Visit (INDEPENDENT_AMBULATORY_CARE_PROVIDER_SITE_OTHER): Payer: Medicare Other | Admitting: *Deleted

## 2019-03-12 DIAGNOSIS — Z5181 Encounter for therapeutic drug level monitoring: Secondary | ICD-10-CM | POA: Diagnosis not present

## 2019-03-12 DIAGNOSIS — I480 Paroxysmal atrial fibrillation: Secondary | ICD-10-CM

## 2019-03-12 LAB — POCT INR: INR: 2.3 (ref 2.0–3.0)

## 2019-03-12 NOTE — Patient Instructions (Signed)
Continue warfarin 1 1/2 tablets daily except 2 tablets on Wednesdays and Saturdays Recheck in 4 weeks in [place] office.

## 2019-03-26 ENCOUNTER — Other Ambulatory Visit: Payer: Self-pay | Admitting: Family Medicine

## 2019-04-01 ENCOUNTER — Ambulatory Visit: Payer: Medicare Other | Attending: Internal Medicine

## 2019-04-01 DIAGNOSIS — Z23 Encounter for immunization: Secondary | ICD-10-CM

## 2019-04-01 NOTE — Progress Notes (Signed)
   Covid-19 Vaccination Clinic  Name:  Austin [name]    MRN: [id number] DOB: 07-12-1932  04/01/2019  [name] was observed post Covid-19 immunization for 15 minutes without incident. He was provided with Vaccine Information Sheet and instruction to access the V-Safe system.   [name] was instructed to call 911 with any severe reactions post vaccine: [name] Difficulty breathing  . Swelling of face and throat  . A fast heartbeat  . A bad rash all over body  . Dizziness and weakness   Immunizations Administered    Name Date Dose VIS Date Route   Moderna COVID-19 Vaccine 04/01/2019 12:49 PM 0.5 mL 12/24/2018 Intramuscular   Manufacturer: Moderna   Lot: [id number]   [place]: [id number]

## 2019-04-09 ENCOUNTER — Other Ambulatory Visit: Payer: Self-pay

## 2019-04-09 ENCOUNTER — Ambulatory Visit (INDEPENDENT_AMBULATORY_CARE_PROVIDER_SITE_OTHER): Payer: Medicare Other | Admitting: *Deleted

## 2019-04-09 DIAGNOSIS — I480 Paroxysmal atrial fibrillation: Secondary | ICD-10-CM

## 2019-04-09 DIAGNOSIS — Z5181 Encounter for therapeutic drug level monitoring: Secondary | ICD-10-CM

## 2019-04-09 LAB — POCT INR: INR: 2.1 (ref 2.0–3.0)

## 2019-04-09 NOTE — Patient Instructions (Signed)
Continue warfarin 1 1/2 tablets daily except 2 tablets on Wednesdays and Saturdays Recheck in 5 weeks in [place] office.

## 2019-04-25 ENCOUNTER — Other Ambulatory Visit: Payer: Self-pay | Admitting: Family Medicine

## 2019-04-29 DIAGNOSIS — I739 Peripheral vascular disease, unspecified: Secondary | ICD-10-CM | POA: Diagnosis not present

## 2019-04-29 DIAGNOSIS — B351 Tinea unguium: Secondary | ICD-10-CM | POA: Diagnosis not present

## 2019-04-29 DIAGNOSIS — L851 Acquired keratosis [keratoderma] palmaris et plantaris: Secondary | ICD-10-CM | POA: Diagnosis not present

## 2019-04-29 DIAGNOSIS — G8190 Hemiplegia, unspecified affecting unspecified side: Secondary | ICD-10-CM | POA: Diagnosis not present

## 2019-05-13 NOTE — Telephone Encounter (Signed)
Close encounter 

## 2019-05-14 ENCOUNTER — Ambulatory Visit (INDEPENDENT_AMBULATORY_CARE_PROVIDER_SITE_OTHER): Payer: Medicare Other | Admitting: *Deleted

## 2019-05-14 DIAGNOSIS — Z5181 Encounter for therapeutic drug level monitoring: Secondary | ICD-10-CM

## 2019-05-14 DIAGNOSIS — I480 Paroxysmal atrial fibrillation: Secondary | ICD-10-CM

## 2019-05-14 LAB — POCT INR: INR: 1.9 — AB (ref 2.0–3.0)

## 2019-05-14 NOTE — Patient Instructions (Signed)
Increase warfarin to 2 tablets daily except 1 1/2 tablets on Sundays, Tuesdays and Thursdays Recheck in 4 weeks in [place] office.

## 2019-05-19 ENCOUNTER — Other Ambulatory Visit: Payer: Self-pay | Admitting: Internal Medicine

## 2019-05-19 ENCOUNTER — Other Ambulatory Visit: Payer: Self-pay | Admitting: Family Medicine

## 2019-05-19 ENCOUNTER — Ambulatory Visit (INDEPENDENT_AMBULATORY_CARE_PROVIDER_SITE_OTHER): Payer: Medicare Other

## 2019-05-19 DIAGNOSIS — Z0000 Encounter for general adult medical examination without abnormal findings: Secondary | ICD-10-CM

## 2019-05-19 NOTE — Progress Notes (Signed)
This visit is being conducted via phone call due to the COVID-19 pandemic. This patient has given me verbal consent via phone to conduct this visit, patient states they are participating from their home address. Some vital signs may be absent or patient reported.   Patient identification: identified by name, DOB, and current address.  Location provider: [facility], Office Persons participating in the virtual visit: Cathlean Cower, MD, Lisette Abu, LPN, patient and wife.    Subjective:   Austin [name] is a 86 y.o. male who presents for Medicare Annual/Subsequent preventive examination.  Review of Systems:  No ROS Medicare Wellness Visit Cardiac Risk Factors include: advanced age (>46mn, >>20women);dyslipidemia;family history of premature cardiovascular disease;hypertension;male gender  Sleep Patterns: No issues with falling sleep; issues with incontinence. Home Safety/Smoke Alarms: Feels safe in home; Smoke alarms in place. Living environment: 2-story home (lives on the first floor).  Lives with wife; uses the following DME: hospital bed, wheelchair and walker. Has 3 children and 5 grandchildren. Seat Belt Safety/Bike Helmet: Wears seat belt.     Objective:    Vitals: There were no vitals taken for this visit.  There is no height or weight on file to calculate BMI.  Advanced Directives 05/19/2019 08/20/2018 09/05/2017 09/04/2017 09/03/2017 08/23/2017 08/22/2017  Does Patient Have a Medical Advance Directive? Yes Yes Yes Yes Yes Yes Yes  Type of AP[place]amed[place]of [place] will [place] will Out of facility DNR (pink MOST or yellow form) Out of facility DNR (pink MOST or yellow form) Out of facility DNR (pink MOST or yellow form) Out of facility DNR (pink MOST or yellow form) Out of facility DNR (pink MOST or yellow form)  Does patient want to make changes to medical advance directive? No - Patient declined No - Patient declined No -  Patient declined No - Patient declined No - Patient declined No - Patient declined No - Patient declined  Copy of HDem[place]in Chart? No - copy requested No - copy requested No - copy requested No - copy requested No - copy requested No - copy requested -  Some encounter information is confidential and restricted. Go to Review Flowsheets activity to see all data.    Tobacco Social History   Tobacco Use  Smoking Status Former Smoker  Smokeless Tobacco Never Used     Counseling given: No   Clinical Intake:  Pre-visit preparation completed: Yes  Pain : No/denies pain     Nutritional Risks: Nausea/ vomitting/ diarrhea Diabetes: No  How often do you need to have someone help you when you read instructions, pamphlets, or other written materials from your doctor or pharmacy?: 2 - Rarely What is the last grade level you completed in school[place] No  Information entered by :: Ellese Julius N. HLowell Guitar LPN  Past Medical History:  Diagnosis Date  . ADENOCARCINOMA, PROSTATE, GLEASON GRADE 5 01/21/2009  . ALLERGIC RHINITIS 01/14/2007  . Bilateral inguinal hernia   . Cholecystitis   . Cholelithiasis   . CKD (chronic kidney disease) stage 3, GFR 30-59 ml/min 04/30/2015  . COLONIC POLYPS, HX OF 01/14/2007  . ELEVATED PROSTATE SPECIFIC ANTIGEN 03/27/2008  . ESOPHAGITIS 01/14/2007  . GERD (gastroesophageal reflux disease)   . HYPERLIPIDEMIA 01/14/2007  . HYPERTENSION 01/14/2007  . LIBIDO, DECREASED 01/15/2007  . PAF ([place]oxysmal atrial fibrillation) [place]   . [place]    [place]   Past Surgical History:  Procedure  Laterality Date  . CHOLECYSTECTOMY N/A 08/17/2017   Procedure: LAPAROSCOPIC CHOLECYSTECTOMY WITH INTRAOPERATIVE CHOLANGIOGRAM ERAS PATHWAY;  Surgeon: Judeth Horn, MD;  Location: [place];  Service: General;  Laterality: N/A;  . ENDOSCOPIC RETROGRADE CHOLANGIOPANCREATOGRAPHY (ERCP) WITH PROPOFOL N/A 08/18/2017    Procedure: ENDOSCOPIC RETROGRADE CHOLANGIOPANCREATOGRAPHY (ERCP) WITH PROPOFOL;  Surgeon: Doran Stabler, MD;  Location: [place];  Service: Endoscopy;  Laterality: N/A;  . ERCP N/A 08/21/2017   Procedure: ENDOSCOPIC RETROGRADE CHOLANGIOPANCREATOGRAPHY (ERCP);  Surgeon: Clarene Essex, MD;  Location: [place];  Service: Endoscopy;  Laterality: N/A;  . ESOPHAGOGASTRODUODENOSCOPY N/A 08/15/2013   Procedure: ESOPHAGOGASTRODUODENOSCOPY (EGD);  Surgeon: Gwenyth Ober, MD;  Location: [place];  Service: General;  Laterality: N/A;  . ESOPHAGOGASTRODUODENOSCOPY (EGD) WITH PROPOFOL N/A 08/18/2017   Procedure: ESOPHAGOGASTRODUODENOSCOPY (EGD) WITH PROPOFOL;  Surgeon: Doran Stabler, MD;  Location: [place];  Service: Endoscopy;  Laterality: N/A;  . HERNIA REPAIR     Bilateral Inguinal hernias  . HIP PINNING,CANNULATED Left 07/18/2017   Procedure: CANNULATED HIP PINNING;  Surgeon: Carole Civil, MD;  Location: [facility];  Service: Orthopedics;  Laterality: Left;  . HIP SURGERY     Left  . IR EXCHANGE BILIARY DRAIN  05/24/2017  . IR EXCHANGE BILIARY DRAIN  07/27/2017  . IR PERC CHOLECYSTOSTOMY  04/10/2017  . JOINT REPLACEMENT    . PANCREATIC STENT PLACEMENT  08/21/2017   Procedure: PANCREATIC STENT PLACEMENT;  Surgeon: Clarene Essex, MD;  Location: [place];  Service: Endoscopy;;  . PEG PLACEMENT N/A 09/02/2013   Procedure: PERCUTANEOUS ENDOSCOPIC GASTROSTOMY (PEG) PLACEMENT;  Surgeon: Gwenyth Ober, MD;  Location: [place];  Service: General;  Laterality: N/A;  . PROSTATE CRYOABLATION    . REMOVAL OF STONES  08/21/2017   Procedure: REMOVAL OF STONES;  Surgeon: Clarene Essex, MD;  Location: [place];  Service: Endoscopy;;  . Austin [name]  08/21/2017   Procedure: Austin [name];  Surgeon: Clarene Essex, MD;  [facility];  Service: Endoscopy;;   Family History  Problem Relation Age of Onset  . Lung cancer Father   . Hypertension Other   . Arthritis Other    Social  History   Socioeconomic History  . Marital status: Married    Spouse name: Not on file  . Number of children: 3  . Years of education: 74  . Highest education level: Not on file  Occupational History  . Occupation: Forensic scientist: [employer]  Tobacco Use  . Smoking status: Former Research scientist (life sciences)  . Smokeless tobacco: Never Used  Substance and Sexual Activity  . Alcohol use: Yes    Comment: socially   . Drug use: No  . Sexual activity: Not Currently    Birth control/protection: None  Other Topics Concern  . Not on file  Social History Narrative   ** Merged History Encounter **       Building services engineer. Married 1961. 2 sons- '63, '69 & 1 daughter '55   Grandchildren 5. 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 Working full time. Discussion needed in regard to Advance Care Planning-DNR/DNI, no artificial feeding or hydration, No HD, no heroic or futile.                Social Determinants of Health   Financial Resource Strain:   . Difficulty of Paying Living Expenses:   Food Insecurity:   . Worried About Charity fundraiser in the Last Year:   . [place] in the Last Year:  Transportation Needs:   . Film/video editor (Medical):   [name] Lack of Transportation (Non-Medical):   Physical Activity:   . Days of Exercise per Week:   . Minutes of Exercise per Session:   Stress:   . Feeling of Stress :   Social Connections:   . Frequency of Communication with Friends and Family:   . Frequency of Social Gatherings with Friends and Family:   . Attends Religious Services:   . Active Member of Clubs or Organizations:   . Attends Archivist Meetings:   [name] Marital Status:     Outpatient Encounter Medications as of 05/19/2019  Medication Sig  . acetaminophen (TYLENOL) 325 MG tablet Take 650 mg by mouth every 8 (eight) hours as needed for moderate pain.   . budesonide-formoterol (SYMBICORT) 160-4.5 MCG/ACT inhaler Inhale 2 puffs into the lungs 2 (two) times daily.   . cholecalciferol (VITAMIN D) 1000 units tablet Take 2,000 Units by mouth daily.  . Control Gel Formula Dressing (DUODERM CGF DRESSING) MISC Apply 1 each topically daily as needed.  . diclofenac sodium (VOLTAREN) 1 % GEL Apply 2 g topically 4 (four) times daily as needed.  . diphenoxylate-atropine (LOMOTIL) 2.5-0.025 MG tablet Take 1 tablet by mouth 4 (four) times daily as needed for diarrhea or loose stools.  . furosemide (LASIX) 20 MG tablet Take 20 mg by mouth daily.  [name] gabapentin (NEURONTIN) 100 MG capsule TAKE 2 CAPSULES BY MOUTH TWICE DAILY.  [name] gabapentin (NEURONTIN) 300 MG capsule TAKE 1 CAPSULE BY MOUTH AT BEDTIME.  [name] omeprazole (PRILOSEC) 40 MG capsule TAKE ONE CAPSULE BY MOUTH ONCE DAILY.  . rosuvastatin (CRESTOR) 10 MG tablet Take 1 tablet (10 mg total) by mouth daily.  . sodium bicarbonate 650 MG tablet Take 1 tablet (650 mg total) by mouth 2 (two) times daily.  [name] tiZANidine (ZANAFLEX) 2 MG tablet TAKE 1 TABLET BY MOUTH TWICE DAILY  . vitamin B-12 (CYANOCOBALAMIN) 1000 MCG tablet Take 1 tablet (1,000 mcg total) by mouth daily.  [name] warfarin (COUMADIN) 5 MG tablet TAKE 1 & 1/2 TABLETS BY MOUTH DAILY OR AS DIRECTED   No facility-administered encounter medications on file as of 05/19/2019.    Activities of Daily Living In your present state of health, do you have any difficulty performing the following activities: 05/19/2019  Hearing? N  Vision? N  Difficulty concentrating or making decisions? N  Walking or climbing stairs? Y  Comment lives on main level of house  Dressing or bathing? Y  Doing errands, shopping? Y  Preparing Food and eating ? Y  Using the Toilet? Y  In the past six months, have you accidently leaked urine? Y  Comment wears Depends  Do you have problems with loss of bowel control? Y  Comment wears Depends  Managing your Medications? Y  Managing your Finances? Y  Housekeeping or managing your Housekeeping? Y  Some recent data might be hidden    Patient Care  Team: [name], MD as PCP - General (Internal Medicine) Carolan Clines, MD (Inactive) as Attending Physician (Urology) Prentiss Bells, MD (Ophthalmology) Carole Civil, MD (Orthopedic Surgery) Essen[place]her, Clarene Duke, OT as Occupational Therapist (Occupational Therapy) Lonna Cobb, PT (Inactive) as Physical Therapist (Physical Therapy) Susy Frizzle, PTA as Physical Therapy Assistant (Physical Therapy)   Assessment:   This is a routine wellness examination for Doniel.  Exercise Activities and Dietary recommendations Current Exercise Habits: The patient does not participate in regular exercise at present, Exercise limited by: cardiac  condition(s);neurologic condition(s);orthopedic condition(s)  Goals   None     Fall Risk Fall Risk  05/19/2019 02/07/2019 08/08/2018 10/22/2017 07/16/2017  Falls in the past year? 0 0 0 Yes No  Number falls in past yr: 0 - - 1 -  Injury with Fall? 0 - - Yes -  Risk Factor Category  - - - High Fall Risk -  Risk for fall due to : Impaired mobility;Impaired balance/gait;Orthopedic patient - - History of fall(s);Impaired balance/gait;Impaired mobility Impaired balance/gait;Impaired mobility  Risk for fall due to: Comment - - - - -  Follow up Falls evaluation completed;Education provided - - (No Data) -  Comment - - - Discussed with spouse. -   Is the patient's home free of loose throw rugs in walkways, pet beds, electrical cords, etc?   yes      Grab bars in the bathroom? yes      Handrails on the stairs?   yes      Adequate lighting?   yes  Depression Screen PHQ 2/9 Scores 05/19/2019 02/07/2019 08/08/2018 07/16/2017  PHQ - 2 Score 0 0 1 0  PHQ- 9 Score - - - -          Immunization History  Administered Date(s) Administered  . Moderna SARS-COVID-2 Vaccination 03/01/2019, 04/01/2019    Qualifies for Shingles Vaccine? declined  Screening Tests Health Maintenance  Topic Date Due  . PNA vac Low Risk Adult (1 of 2 - PCV13)  08/08/2019 (Originally 01/23/2000)  . INFLUENZA VACCINE  08/24/2019  . TETANUS/TDAP  02/04/2022  . COVID-19 Vaccine  Completed   Cancer Screenings: Lung: Low Dose CT Chest recommended if Age 51-80 years, 30 pack-year currently smoking OR have quit w/in 15years. Patient does not qualify. Colorectal: not recommended due to age     Plan:    Reviewed health maintenance screenings with patient today and relevant education, vaccines, and/or referrals were provided.    Continue doing brain stimulating activities (puzzles, reading, adult coloring books, staying active) to keep memory sharp.    Continue to eat heart healthy diet (full of fruits, vegetables, whole grains, lean protein, water--limit salt, fat, and sugar intake) and increase physical activity as tolerated.   I have personally reviewed and noted the following in the patient's chart:   . Medical and social history . Use of alcohol, tobacco or illicit drugs  . Current medications and supplements . Functional ability and status . Nutritional status . Physical activity . Advanced directives . List of other physicians . Hospitalizations, surgeries, and ER visits in previous 12 months . Vitals . Screenings to include cognitive, depression, and falls . Referrals and appointments  In addition, I have reviewed and discussed with patient certain preventive protocols, quality metrics, and best practice recommendations. A written personalized care plan for preventive services as well as general preventive health recommendations were provided to patient.     Sheral Flow, LPN  5/88/3254  Nurse Health Advisor  Nurse Note:  No vital signs were taken during this visit. Patient declined After Visit Summary to be mailed.

## 2019-05-19 NOTE — Patient Instructions (Addendum)
Austin [name] , Thank you for taking time to come for your Medicare Wellness Visit. I appreciate your ongoing commitment to your health goals. Please review the following plan we discussed and let me know if I can assist you in the future.   Screening recommendations/referrals: Colorectal Screening: 12/19/2011  Vision and Dental Exams: Recommended annual ophthalmology exams for early detection of glaucoma and other disorders of the eye Recommended annual dental exams for proper oral hygiene  Vaccinations: Influenza vaccine: due; last done 02/05/2012 Pneumococcal vaccine: Pneumovax 02/05/2012; Prevnar due Tdap vaccine: declined; Please call your insurance company to determine your out of pocket expense. You may also receive this vaccine at your local pharmacy or Health Dept. Shingles vaccine: Please call your insurance company to determine your out of pocket expense for the Shingrix vaccine. You may receive this vaccine at your local pharmacy.  Covid vaccine: completed; Moderna 03/01/2019, 04/01/2019  Advanced directives: Advance directives discussed with you today. I have provided a copy for you to complete at home and have notarized. Once this is complete please bring a copy in to our office so we can scan it into your chart.  OR  Advance directives discussed with you today. You have declined to receive documents for completion.  OR  We have received a copy of your POA (Power of Attorney) and/or Living Will. These documents can be located in your chart.  OR  Please bring a copy of your POA (Power of Attorney) and/or Living Will to your next appointment.  Goals:  Recommend to drink at least 6-8 8oz glasses of water per day.  Recommend to exercise for at least 150 minutes per week.  Recommend to remove any items from the home that may cause slips or trips.  Recommend to decrease portion sizes by eating 3 small healthy meals and at least 2 healthy snacks per day.  Recommend to begin  DASH diet as directed below  Recommend to continue efforts to reduce smoking habits until no longer smoking. Smoking Cessation literature is attached below.  Next appointment: Please schedule your Annual Wellness Visit with your Nurse Health Advisor in one year.  Preventive Care 22 Years and Older, Male Preventive care refers to lifestyle choices and visits with your health care provider that can promote health and wellness. What does preventive care include?  A yearly physical exam. This is also called an annual well check.  Dental exams once or twice a year.  Routine eye exams. Ask your health care provider how often you should have your eyes checked.  Personal lifestyle choices, including:  Daily care of your teeth and gums.  Regular physical activity.  Eating a healthy diet.  Avoiding tobacco and drug use.  Limiting alcohol use.  Practicing safe sex.  Taking low doses of aspirin every day if recommended by your health care provider..  Taking vitamin and mineral supplements as recommended by your health care provider. What happens during an annual well check? The services and screenings done by your health care provider during your annual well check will depend on your age, overall health, lifestyle risk factors, and family history of disease. Counseling  Your health care provider may ask you questions about your:  Alcohol use.  Tobacco use.  Drug use.  Emotional well-being.  Home and relationship well-being.  Sexual activity.  Eating habits.  History of falls.  Memory and ability to understand (cognition).  Work and work Statistician. Screening  You may have the following tests or measurements:  Height,  weight, and BMI.  Blood pressure.  Lipid and cholesterol levels. These may be checked every 5 years, or more frequently if you are over 57 years old.  Skin check.  Lung cancer screening. You may have this screening every year starting at age 85 if  you have a 30-pack-year history of smoking and currently smoke or have quit within the past 15 years.  Fecal occult blood test (FOBT) of the stool. You may have this test every year starting at age 32.  Flexible sigmoidoscopy or colonoscopy. You may have a sigmoidoscopy every 5 years or a colonoscopy every 10 years starting at age 10.  Prostate cancer screening. Recommendations will vary depending on your family history and other risks.  Hepatitis C blood test.  Hepatitis B blood test.  Sexually transmitted disease (STD) testing.  Diabetes screening. This is done by checking your blood sugar (glucose) after you have not eaten for a while (fasting). You may have this done every 1-3 years.  Abdominal aortic aneurysm (AAA) screening. You may need this if you are a current or former smoker.  Osteoporosis. You may be screened starting at age 41 if you are at high risk. Talk with your health care provider about your test results, treatment options, and if necessary, the need for more tests. Vaccines  Your health care provider may recommend certain vaccines, such as:  Influenza vaccine. This is recommended every year.  Tetanus, diphtheria, and acellular pertussis (Tdap, Td) vaccine. You may need a Td booster every 10 years.  Zoster vaccine. You may need this after age 80.  Pneumococcal 13-valent conjugate (PCV13) vaccine. One dose is recommended after age 8.  Pneumococcal polysaccharide (PPSV23) vaccine. One dose is recommended after age 58. Talk to your health care provider about which screenings and vaccines you need and how often you need them. This information is not intended to replace advice given to you by your health care provider. Make sure you discuss any questions you have with your health care provider. Document Released: 02/05/2015 Document Revised: 09/29/2015 Document Reviewed: 11/10/2014 Elsevier Interactive Patient Education  2017 [place] Prevention in the  Home Falls can cause injuries. They can happen to people of all ages. There are many things you can do to make your home safe and to help prevent falls. What can I do on the outside of my home?  Regularly fix the edges of walkways and driveways and fix any cracks.  Remove anything that might make you trip as you walk through a door, such as a raised step or threshold.  Trim any bushes or trees on the path to your home.  Use bright outdoor lighting.  Clear any walking paths of anything that might make someone trip, such as rocks or tools.  Regularly check to see if handrails are loose or broken. Make sure that both sides of any steps have handrails.  Any raised decks and porches should have guardrails on the edges.  Have any leaves, snow, or ice cleared regularly.  Use sand or salt on walking paths during winter.  Clean up any spills in your garage right away. This includes oil or grease spills. What can I do in the bathroom?  Use night lights.  Install grab bars by the toilet and in the tub and shower. Do not use towel bars as grab bars.  Use non-skid mats or decals in the tub or shower.  If you need to sit down in the shower, use a plastic, non-slip  stool.  Keep the floor dry. Clean up any water that spills on the floor as soon as it happens.  Remove soap buildup in the tub or shower regularly.  Attach bath mats securely with double-sided non-slip rug tape.  Do not have throw rugs and other things on the floor that can make you trip. What can I do in the bedroom?  Use night lights.  Make sure that you have a light by your bed that is easy to reach.  Do not use any sheets or blankets that are too big for your bed. They should not hang down onto the floor.  Have a firm chair that has side arms. You can use this for support while you get dressed.  Do not have throw rugs and other things on the floor that can make you trip. What can I do in the kitchen?  Clean up any  spills right away.  Avoid walking on wet floors.  Keep items that you use a lot in easy-to-reach places.  If you need to reach something above you, use a strong step stool that has a grab bar.  Keep electrical cords out of the way.  Do not use floor polish or wax that makes floors slippery. If you must use wax, use non-skid floor wax.  Do not have throw rugs and other things on the floor that can make you trip. What can I do with my stairs?  Do not leave any items on the stairs.  Make sure that there are handrails on both sides of the stairs and use them. Fix handrails that are broken or loose. Make sure that handrails are as long as the stairways.  Check any carpeting to make sure that it is firmly attached to the stairs. Fix any carpet that is loose or worn.  Avoid having throw rugs at the top or bottom of the stairs. If you do have throw rugs, attach them to the floor with carpet tape.  Make sure that you have a light switch at the top of the stairs and the bottom of the stairs. If you do not have them, ask someone to add them for you. What else can I do to help prevent falls?  Wear shoes that:  Do not have high heels.  Have rubber bottoms.  Are comfortable and fit you well.  Are closed at the toe. Do not wear sandals.  If you use a stepladder:  Make sure that it is fully opened. Do not climb a closed stepladder.  Make sure that both sides of the stepladder are locked into place.  Ask someone to hold it for you, if possible.  Clearly mark and make sure that you can see:  Any grab bars or handrails.  First and last steps.  Where the edge of each step is.  Use tools that help you move around (mobility aids) if they are needed. These include:  Canes.  Walkers.  Scooters.  Crutches.  Turn on the lights when you go into a dark area. Replace any light bulbs as soon as they burn out.  Set up your furniture so you have a clear path. Avoid moving your  furniture around.  If any of your floors are uneven, fix them.  If there are any pets around you, be aware of where they are.  Review your medicines with your doctor. Some medicines can make you feel dizzy. This can increase your chance of falling. Ask your doctor what other things that  you can do to help prevent falls. This information is not intended to replace advice given to you by your health care provider. Make sure you discuss any questions you have with your health care provider. Document Released: 11/05/2008 Document Revised: 06/17/2015 Document Reviewed: 02/13/2014 Elsevier Interactive Patient Education  2017 [employer].

## 2019-05-26 ENCOUNTER — Other Ambulatory Visit: Payer: Self-pay | Admitting: Family Medicine

## 2019-06-11 ENCOUNTER — Other Ambulatory Visit: Payer: Self-pay

## 2019-06-11 ENCOUNTER — Ambulatory Visit (INDEPENDENT_AMBULATORY_CARE_PROVIDER_SITE_OTHER): Payer: Medicare Other | Admitting: Pharmacist

## 2019-06-11 ENCOUNTER — Other Ambulatory Visit: Payer: Self-pay | Admitting: Internal Medicine

## 2019-06-11 DIAGNOSIS — Z5181 Encounter for therapeutic drug level monitoring: Secondary | ICD-10-CM

## 2019-06-11 DIAGNOSIS — I480 Paroxysmal atrial fibrillation: Secondary | ICD-10-CM

## 2019-06-11 LAB — POCT INR: INR: 2.7 (ref 2.0–3.0)

## 2019-06-11 NOTE — Patient Instructions (Signed)
Description   Continue taking 2 tablets daily except 1 and 1/2 tablets on Sundays, Tuesdays and Thursdays Recheck in 5 weeks in [place] office.

## 2019-06-11 NOTE — Telephone Encounter (Signed)
Please direct pt to contact his coumadin clinic regarding the refill of coumadin

## 2019-06-17 ENCOUNTER — Telehealth: Payer: Self-pay

## 2019-06-17 NOTE — Telephone Encounter (Signed)
Sent referral request over to Dr. Jenny Reichmann.

## 2019-06-17 NOTE — Telephone Encounter (Signed)
Ericka Pontiff with [facility] calling and is requesting a referral to [facility] for [facility]illed [facility]. 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 States that he is not with a home health agency.

## 2019-06-18 NOTE — Telephone Encounter (Signed)
Unfortunately not able since pt seen > 90 days  By law, pt Would need OV since last visit was feb 12

## 2019-06-19 NOTE — Telephone Encounter (Signed)
Called and lvm for Austin [name] informing him of Dr. Gwynn Burly note.

## 2019-06-25 ENCOUNTER — Other Ambulatory Visit: Payer: Self-pay | Admitting: Family Medicine

## 2019-07-08 DIAGNOSIS — G8190 Hemiplegia, unspecified affecting unspecified side: Secondary | ICD-10-CM | POA: Diagnosis not present

## 2019-07-08 DIAGNOSIS — L851 Acquired keratosis [keratoderma] palmaris et plantaris: Secondary | ICD-10-CM | POA: Diagnosis not present

## 2019-07-08 DIAGNOSIS — B351 Tinea unguium: Secondary | ICD-10-CM | POA: Diagnosis not present

## 2019-07-08 DIAGNOSIS — I739 Peripheral vascular disease, unspecified: Secondary | ICD-10-CM | POA: Diagnosis not present

## 2019-07-16 ENCOUNTER — Ambulatory Visit (INDEPENDENT_AMBULATORY_CARE_PROVIDER_SITE_OTHER): Payer: Medicare Other | Admitting: *Deleted

## 2019-07-16 ENCOUNTER — Other Ambulatory Visit: Payer: Self-pay

## 2019-07-16 DIAGNOSIS — Z5181 Encounter for therapeutic drug level monitoring: Secondary | ICD-10-CM

## 2019-07-16 DIAGNOSIS — I480 Paroxysmal atrial fibrillation: Secondary | ICD-10-CM | POA: Diagnosis not present

## 2019-07-16 LAB — POCT INR: INR: 3.4 — AB (ref 2.0–3.0)

## 2019-07-16 NOTE — Patient Instructions (Signed)
Hold warfarin tonight then resume 2 tablets daily except 1 and 1/2 tablets on Sundays, Tuesdays and Thursdays Recheck in 6 weeks in [place] office.

## 2019-07-21 ENCOUNTER — Other Ambulatory Visit: Payer: Self-pay | Admitting: Family Medicine

## 2019-08-11 ENCOUNTER — Other Ambulatory Visit: Payer: Self-pay | Admitting: Family Medicine

## 2019-08-11 NOTE — Telephone Encounter (Signed)
Called patient to set up virtual visit. It has been a year since we seen patient.

## 2019-08-12 ENCOUNTER — Ambulatory Visit (INDEPENDENT_AMBULATORY_CARE_PROVIDER_SITE_OTHER): Payer: Medicare Other | Admitting: Family Medicine

## 2019-08-12 ENCOUNTER — Encounter: Payer: Self-pay | Admitting: Family Medicine

## 2019-08-12 ENCOUNTER — Other Ambulatory Visit: Payer: Self-pay

## 2019-08-12 DIAGNOSIS — M25562 Pain in left knee: Secondary | ICD-10-CM

## 2019-08-12 DIAGNOSIS — M5416 Radiculopathy, lumbar region: Secondary | ICD-10-CM

## 2019-08-12 DIAGNOSIS — I639 Cerebral infarction, unspecified: Secondary | ICD-10-CM

## 2019-08-12 NOTE — Telephone Encounter (Signed)
Patient scheduled for phone call at 4:15

## 2019-08-12 NOTE — Progress Notes (Signed)
Virtual Visit via Video Note  I connected with Austin [name] on 08/12/19 at  4:15 PM EDT by a video enabled telemedicine application and verified that I am speaking with the correct person using two identifiers.  Location: Patient: Patient is at the site of the following but talk to his primary caregiver which is his wife.  They are in home setting Provider: In office setting   I discussed the limitations of evaluation and management by telemedicine and the availability of in person appointments. The patient expressed understanding and agreed to proceed.  History of Present Illness: 86 year old gentleman who is in a wheelchair who was seen multiple times for more knee pain and back pain.  Has had lumbar radiculopathy.  Patient has responded well to gabapentin in the past.  We will continue to take it.  Feels that helps with pain during the day as well as at night.  Patient does not notice any increasing somnolence secondary to the medicine and has not noticed any other side effects.  Patient's primary caregiver does state that it seems that he does better when he is taking it on a regular basis.    Observations/Objective: Unfortunately talk to mostly the primary caregiver.  Did mostly on the phone, virtual platform was not working   Assessment and Plan: 86 year old gentleman with lumbar radiculopathy and chronic back pain responding well to gabapentin.  Refilled gabapentin.  Warned of potential side effects and when to potentially stop medication.  Encouraged him not to increase any dosing secondary to patient's chronic kidney disease.  Follow-up with me again 1 year   Follow Up Instructions: 1 year    I discussed the assessment and treatment plan with the patient. The patient was provided an opportunity to ask questions and all were answered. The patient agreed with the plan and demonstrated an understanding of the instructions.   The patient was advised to call back or seek an in-person  evaluation if the symptoms worsen or if the condition fails to improve as anticipated.  I provided 15 minutes of face-to-face time during this encounter.   Lyndal Pulley, DO

## 2019-08-25 ENCOUNTER — Other Ambulatory Visit: Payer: Self-pay | Admitting: Family Medicine

## 2019-08-27 ENCOUNTER — Ambulatory Visit (INDEPENDENT_AMBULATORY_CARE_PROVIDER_SITE_OTHER): Payer: Medicare Other | Admitting: *Deleted

## 2019-08-27 DIAGNOSIS — Z5181 Encounter for therapeutic drug level monitoring: Secondary | ICD-10-CM

## 2019-08-27 DIAGNOSIS — I480 Paroxysmal atrial fibrillation: Secondary | ICD-10-CM | POA: Diagnosis not present

## 2019-08-27 LAB — POCT INR: INR: 3.2 — AB (ref 2.0–3.0)

## 2019-08-27 NOTE — Patient Instructions (Signed)
Decrease warfarin to 1 1/2 tablets daily except 2 tablet on Mondays and Fridays Recheck in 6 weeks in [place] office.

## 2019-09-10 ENCOUNTER — Ambulatory Visit (INDEPENDENT_AMBULATORY_CARE_PROVIDER_SITE_OTHER): Payer: Medicare Other | Admitting: Internal Medicine

## 2019-09-10 ENCOUNTER — Encounter: Payer: Self-pay | Admitting: Internal Medicine

## 2019-09-10 ENCOUNTER — Other Ambulatory Visit: Payer: Self-pay

## 2019-09-10 VITALS — BP 126/78 | HR 70 | Temp 97.9°F | Ht 66.5 in

## 2019-09-10 DIAGNOSIS — I639 Cerebral infarction, unspecified: Secondary | ICD-10-CM | POA: Diagnosis not present

## 2019-09-10 DIAGNOSIS — N183 Chronic kidney disease, stage 3 unspecified: Secondary | ICD-10-CM | POA: Diagnosis not present

## 2019-09-10 DIAGNOSIS — E782 Mixed hyperlipidemia: Secondary | ICD-10-CM

## 2019-09-10 DIAGNOSIS — I10 Essential (primary) hypertension: Secondary | ICD-10-CM | POA: Diagnosis not present

## 2019-09-10 DIAGNOSIS — E538 Deficiency of other specified B group vitamins: Secondary | ICD-10-CM | POA: Diagnosis not present

## 2019-09-10 MED ORDER — FLUTICASONE-SALMETEROL 250-50 MCG/DOSE IN AEPB
1.0000 | INHALATION_SPRAY | Freq: Two times a day (BID) | RESPIRATORY_TRACT | 11 refills | Status: DC
Start: 2019-09-10 — End: 2020-09-03

## 2019-09-10 NOTE — Patient Instructions (Signed)
Please take all new medication as prescribed - the advair for asthma  Please continue all other medications as before, and refills have been done if requested.  Please have the pharmacy call with any other refills you may need.  Please continue your efforts at being more active, low cholesterol diet, and weight control..  Please keep your appointments with your specialists as you may have planned  Please make an Appointment to return in 6 months, or sooner if needed

## 2019-09-10 NOTE — Progress Notes (Signed)
Subjective:    Patient ID: Austin [name], male    DOB: [birth year], 86 y.o.   MRN: [id number]  HPI  Here to f/u; overall doing ok,  Pt denies chest pain, increasing sob or doe, wheezing, orthopnea, PND, increased LE swelling, palpitations, dizziness or syncope, except for occasional wheezing as he is not taking the symbicort due to cost  Pt denies new neurological symptoms such as new headache, or facial or extremity weakness or numbness.  Pt denies polydipsia, polyuria, or low sugar episode.  Pt states overall good compliance with meds, mostly trying to follow appropriate diet, with wt overall stable,   Pt denies fever, wt loss, night sweats, loss of appetite, or other constitutional symptoms Past Medical History:  Diagnosis Date  . ADENOCARCINOMA, PROSTATE, GLEASON GRADE 5 01/21/2009  . ALLERGIC RHINITIS 01/14/2007  . Bilateral inguinal hernia   . Cholecystitis   . Cholelithiasis   . CKD (chronic kidney disease) stage 3, GFR 30-59 ml/min 04/30/2015  . COLONIC POLYPS, HX OF 01/14/2007  . ELEVATED PROSTATE SPECIFIC ANTIGEN 03/27/2008  . ESOPHAGITIS 01/14/2007  . GERD (gastroesophageal reflux disease)   . HYPERLIPIDEMIA 01/14/2007  . HYPERTENSION 01/14/2007  . LIBIDO, DECREASED 01/15/2007  . PAF (paroxysmal atrial fibrillation) ([place])   . Paralysis ([place])    from [place] 02/2013   Past Surgical History:  Procedure Laterality Date  . CHOLECYSTECTOMY N/A 08/17/2017   Procedure: LAPAROSCOPIC CHOLECYSTECTOMY WITH INTRAOPERATIVE CHOLANGIOGRAM ERAS PATHWAY;  Surgeon: Judeth Horn, MD;  Location: [facility];  Service: General;  Laterality: N/A;  . ENDOSCOPIC RETROGRADE CHOLANGIOPANCREATOGRAPHY (ERCP) WITH PROPOFOL N/A 08/18/2017   Procedure: ENDOSCOPIC RETROGRADE CHOLANGIOPANCREATOGRAPHY (ERCP) WITH PROPOFOL;  Surgeon: Doran Stabler, MD;  Location: [facility];  Service: Endoscopy;  Laterality: N/A;  . ERCP N/A 08/21/2017   Procedure: ENDOSCOPIC RETROGRADE CHOLANGIOPANCREATOGRAPHY (ERCP);  Surgeon:  Clarene Essex, MD;  Location: [facility];  Service: Endoscopy;  Laterality: N/A;  . ESOPHAGOGASTRODUODENOSCOPY N/A 08/15/2013   Procedure: ESOPHAGOGASTRODUODENOSCOPY (EGD);  Surgeon: Gwenyth Ober, MD;  Location: [facility];  Service: General;  Laterality: N/A;  . ESOPHAGOGASTRODUODENOSCOPY (EGD) WITH PROPOFOL N/A 08/18/2017   Procedure: ESOPHAGOGASTRODUODENOSCOPY (EGD) WITH PROPOFOL;  Surgeon: Doran Stabler, MD;  Location: [facility];  Service: Endoscopy;  Laterality: N/A;  . HERNIA REPAIR     Bilateral Inguinal hernias  . HIP PINNING,CANNULATED Left 07/18/2017   Procedure: CANNULATED HIP PINNING;  Surgeon: Carole Civil, MD;  Location: [facility];  Service: Orthopedics;  Laterality: Left;  . HIP SURGERY     Left  . IR EXCHANGE BILIARY DRAIN  05/24/2017  . IR EXCHANGE BILIARY DRAIN  07/27/2017  . IR PERC CHOLECYSTOSTOMY  04/10/2017  . JOINT REPLACEMENT    . PANCREATIC STENT PLACEMENT  08/21/2017   Procedure: PANCREATIC STENT PLACEMENT;  Surgeon: Clarene Essex, MD;  Location: [facility];  Service: Endoscopy;;  . PEG PLACEMENT N/A 09/02/2013   Procedure: PERCUTANEOUS ENDOSCOPIC GASTROSTOMY (PEG) PLACEMENT;  Surgeon: Gwenyth Ober, MD;  Location: [facility];  Service: General;  Laterality: N/A;  . PROSTATE CRYOABLATION    . REMOVAL OF STONES  08/21/2017   Procedure: REMOVAL OF STONES;  Surgeon: Clarene Essex, MD;  [facility];  Service: Endoscopy;;  . Joan Mayans  08/21/2017   Procedure: Joan Mayans;  Surgeon: Clarene Essex, MD;  Location: [facility];  Service: Endoscopy;;    reports that he has quit smoking. He has never used smokeless tobacco. He reports current alcohol use. He reports that he does not use drugs. family history  includes Arthritis in an other family member; Hypertension in an other family member; Lung cancer in his father. No Known Allergies Current Outpatient Medications on File Prior to Visit  Medication Sig Dispense Refill  . acetaminophen (TYLENOL)  325 MG tablet Take 650 mg by mouth every 8 (eight) hours as needed for moderate pain.     . cholecalciferol (VITAMIN D) 1000 units tablet Take 2,000 Units by mouth daily.    . Control Gel Formula Dressing (DUODERM CGF DRESSING) MISC Apply 1 each topically daily as needed. 100 each 3  . diclofenac sodium (VOLTAREN) 1 % GEL Apply 2 g topically 4 (four) times daily as needed. 100 g 11  . diphenoxylate-atropine (LOMOTIL) 2.5-0.025 MG tablet Take 1 tablet by mouth 4 (four) times daily as needed for diarrhea or loose stools. 60 tablet 1  . furosemide (LASIX) 20 MG tablet Take 20 mg by mouth daily.    [name] gabapentin (NEURONTIN) 100 MG capsule TAKE 2 CAPSULES BY MOUTH TWICE DAILY. 180 capsule 0  . gabapentin (NEURONTIN) 300 MG capsule TAKE 1 CAPSULE BY MOUTH AT BEDTIME. 90 capsule 0  . omeprazole (PRILOSEC) 40 MG capsule TAKE ONE CAPSULE BY MOUTH ONCE DAILY. 30 capsule 5  . rosuvastatin (CRESTOR) 10 MG tablet TAKE ONE TABLET BY MOUTH ONCE DAILY. 90 tablet 2  . sodium bicarbonate 650 MG tablet Take 1 tablet (650 mg total) by mouth 2 (two) times daily. 60 tablet 11  . tiZANidine (ZANAFLEX) 2 MG tablet TAKE 1 TABLET BY MOUTH TWICE DAILY 60 tablet 0  . vitamin B-12 (CYANOCOBALAMIN) 1000 MCG tablet Take 1 tablet (1,000 mcg total) by mouth daily. 90 tablet 3  . warfarin (COUMADIN) 5 MG tablet Take 1.5 to 2 tablets daily as directed by Coumadin clinic 60 tablet 3   No current facility-administered medications on file prior to visit.   Review of Systems All otherwise neg per pt    Objective:   Physical Exam BP 126/78   Pulse 70   Temp 97.9 F (36.6 C) (Oral)   Ht 5' 6.5" (1.689 m)   SpO2 97%   BMI 31.80 kg/m  VS noted,  Constitutional: Pt appears in NAD HENT: Head: NCAT.  Right Ear: External ear normal.  Left Ear: External ear normal.  Eyes: . Pupils are equal, round, and reactive to light. Conjunctivae and EOM are normal Nose: without d/c or deformity Neck: Neck supple. Gross normal  ROM Cardiovascular: Normal rate and regular rhythm.   Pulmonary/Chest: Effort normal and breath sounds without rales or wheezing.  Abd:  Soft, NT, ND, + BS, no organomegaly Neurological: [name] alert. At baseline orientation Skin: Skin is warm. No rashes, other new lesions, no LE edema Psychiatric: Pt behavior is normal without agitation  All otherwise neg per pt Lab Results  Component Value Date   WBC 7.9 02/07/2019   HGB 13.5 02/07/2019   HCT 41.1 02/07/2019   PLT 172.0 02/07/2019   GLUCOSE 85 02/07/2019   CHOL 156 02/07/2019   TRIG 279.0 (H) 02/07/2019   HDL 36.40 (L) 02/07/2019   LDLDIRECT 82.0 02/07/2019   LDLCALC 110 (H) 04/03/2013   ALT 15 02/07/2019   AST 16 02/07/2019   NA 141 02/07/2019   K 4.3 02/07/2019   CL 106 02/07/2019   CREATININE 1.60 (H) 02/07/2019   BUN 23 02/07/2019   CO2 26 02/07/2019   TSH 1.98 02/07/2019   PSA 7.27 (H) 04/14/2008   INR 3.2 (A) 08/27/2019   HGBA1C 6.0 (H) 09/04/2013  Assessment & Plan:

## 2019-09-13 NOTE — Assessment & Plan Note (Signed)
For b12 oral replacement

## 2019-09-13 NOTE — Assessment & Plan Note (Addendum)
stable overall by history and exam, recent data reviewed with pt, and pt to continue medical treatment as before,  to f/u any worsening symptoms or concerns  I spent 31 minutes in preparing to see the patient by review of recent labs, imaging and procedures, obtaining and reviewing separately obtained history, communicating with the patient and family or caregiver, ordering medications, tests or procedures, and documenting clinical information in the EHR including the differential Dx, treatment, and any further evaluation and other management of htn, hld, ckd, b12 def

## 2019-09-13 NOTE — Assessment & Plan Note (Signed)
stable overall by history and exam, recent data reviewed with pt, and pt to continue medical treatment as before,  to f/u any worsening symptoms or concerns  

## 2019-09-16 DIAGNOSIS — L851 Acquired keratosis [keratoderma] palmaris et plantaris: Secondary | ICD-10-CM | POA: Diagnosis not present

## 2019-09-16 DIAGNOSIS — B351 Tinea unguium: Secondary | ICD-10-CM | POA: Diagnosis not present

## 2019-09-16 DIAGNOSIS — I739 Peripheral vascular disease, unspecified: Secondary | ICD-10-CM | POA: Diagnosis not present

## 2019-09-25 ENCOUNTER — Other Ambulatory Visit: Payer: Self-pay | Admitting: Internal Medicine

## 2019-09-25 ENCOUNTER — Other Ambulatory Visit: Payer: Self-pay | Admitting: Family Medicine

## 2019-09-25 NOTE — Telephone Encounter (Signed)
Ok to let pt know to request med from coumadin clinic

## 2019-10-09 ENCOUNTER — Ambulatory Visit (INDEPENDENT_AMBULATORY_CARE_PROVIDER_SITE_OTHER): Payer: Medicare Other | Admitting: *Deleted

## 2019-10-09 ENCOUNTER — Other Ambulatory Visit: Payer: Self-pay

## 2019-10-09 DIAGNOSIS — I480 Paroxysmal atrial fibrillation: Secondary | ICD-10-CM

## 2019-10-09 DIAGNOSIS — Z5181 Encounter for therapeutic drug level monitoring: Secondary | ICD-10-CM

## 2019-10-09 LAB — POCT INR: INR: 2.5 (ref 2.0–3.0)

## 2019-10-09 NOTE — Patient Instructions (Signed)
Continue warfarin 1 1/2 tablets daily except 2 tablet on Mondays and Fridays Recheck in 6 weeks in [place] office.

## 2019-10-16 DIAGNOSIS — N184 Chronic kidney disease, stage 4 (severe): Secondary | ICD-10-CM | POA: Diagnosis not present

## 2019-10-22 DIAGNOSIS — I129 Hypertensive chronic kidney disease with stage 1 through stage 4 chronic kidney disease, or unspecified chronic kidney disease: Secondary | ICD-10-CM | POA: Diagnosis not present

## 2019-10-22 DIAGNOSIS — N1832 Chronic kidney disease, stage 3b: Secondary | ICD-10-CM | POA: Diagnosis not present

## 2019-10-22 DIAGNOSIS — D631 Anemia in chronic kidney disease: Secondary | ICD-10-CM | POA: Diagnosis not present

## 2019-10-22 DIAGNOSIS — N2581 Secondary hyperparathyroidism of renal origin: Secondary | ICD-10-CM | POA: Diagnosis not present

## 2019-10-23 ENCOUNTER — Other Ambulatory Visit: Payer: Self-pay | Admitting: Family Medicine

## 2019-11-04 ENCOUNTER — Other Ambulatory Visit: Payer: Self-pay | Admitting: Family Medicine

## 2019-11-10 ENCOUNTER — Other Ambulatory Visit: Payer: Self-pay | Admitting: Family Medicine

## 2019-11-14 ENCOUNTER — Other Ambulatory Visit: Payer: Self-pay | Admitting: Family Medicine

## 2019-11-20 ENCOUNTER — Ambulatory Visit (INDEPENDENT_AMBULATORY_CARE_PROVIDER_SITE_OTHER): Payer: Medicare Other | Admitting: *Deleted

## 2019-11-20 DIAGNOSIS — Z5181 Encounter for therapeutic drug level monitoring: Secondary | ICD-10-CM

## 2019-11-20 DIAGNOSIS — I480 Paroxysmal atrial fibrillation: Secondary | ICD-10-CM | POA: Diagnosis not present

## 2019-11-20 LAB — POCT INR: INR: 2.5 (ref 2.0–3.0)

## 2019-11-20 NOTE — Patient Instructions (Signed)
Continue warfarin 1 1/2 tablets daily except 2 tablet on Mondays and Fridays Recheck in 6 weeks in [place] office.

## 2019-11-25 DIAGNOSIS — B351 Tinea unguium: Secondary | ICD-10-CM | POA: Diagnosis not present

## 2019-11-25 DIAGNOSIS — I739 Peripheral vascular disease, unspecified: Secondary | ICD-10-CM | POA: Diagnosis not present

## 2019-11-25 DIAGNOSIS — L851 Acquired keratosis [keratoderma] palmaris et plantaris: Secondary | ICD-10-CM | POA: Diagnosis not present

## 2019-11-25 DIAGNOSIS — G8190 Hemiplegia, unspecified affecting unspecified side: Secondary | ICD-10-CM | POA: Diagnosis not present

## 2019-11-26 DIAGNOSIS — H5201 Hypermetropia, right eye: Secondary | ICD-10-CM | POA: Diagnosis not present

## 2019-11-26 DIAGNOSIS — H4720 Unspecified optic atrophy: Secondary | ICD-10-CM | POA: Diagnosis not present

## 2019-11-26 DIAGNOSIS — H40013 Open angle with borderline findings, low risk, bilateral: Secondary | ICD-10-CM | POA: Diagnosis not present

## 2019-11-26 DIAGNOSIS — H43813 Vitreous degeneration, bilateral: Secondary | ICD-10-CM | POA: Diagnosis not present

## 2019-12-03 ENCOUNTER — Other Ambulatory Visit: Payer: Self-pay | Admitting: Internal Medicine

## 2019-12-04 NOTE — Telephone Encounter (Signed)
Sent to [facility].

## 2019-12-04 NOTE — Telephone Encounter (Signed)
Please refill.

## 2019-12-31 ENCOUNTER — Ambulatory Visit (INDEPENDENT_AMBULATORY_CARE_PROVIDER_SITE_OTHER): Payer: Medicare Other | Admitting: *Deleted

## 2019-12-31 DIAGNOSIS — Z5181 Encounter for therapeutic drug level monitoring: Secondary | ICD-10-CM | POA: Diagnosis not present

## 2019-12-31 DIAGNOSIS — I480 Paroxysmal atrial fibrillation: Secondary | ICD-10-CM | POA: Diagnosis not present

## 2019-12-31 LAB — POCT INR: INR: 2.9 (ref 2.0–3.0)

## 2019-12-31 NOTE — Patient Instructions (Signed)
Continue warfarin 1 1/2 tablets daily except 2 tablet on Mondays and Fridays Recheck in 6 weeks in [place] office.

## 2020-01-06 ENCOUNTER — Other Ambulatory Visit: Payer: Self-pay | Admitting: Internal Medicine

## 2020-01-06 NOTE — Telephone Encounter (Signed)
Plesase ask pt to request from his coumadin clinic

## 2020-01-08 NOTE — Telephone Encounter (Signed)
Please refill.

## 2020-01-14 DIAGNOSIS — Z23 Encounter for immunization: Secondary | ICD-10-CM | POA: Diagnosis not present

## 2020-02-04 ENCOUNTER — Other Ambulatory Visit: Payer: Self-pay | Admitting: Internal Medicine

## 2020-02-04 NOTE — Telephone Encounter (Signed)
Please refill as per office routine med refill policy (all routine meds refilled for 3 mo or monthly per pt preference up to one year from last visit, then month to month grace period for 3 mo, then further med refills will have to be denied)  

## 2020-02-19 ENCOUNTER — Ambulatory Visit (INDEPENDENT_AMBULATORY_CARE_PROVIDER_SITE_OTHER): Payer: Medicare Other | Admitting: *Deleted

## 2020-02-19 ENCOUNTER — Other Ambulatory Visit: Payer: Self-pay

## 2020-02-19 DIAGNOSIS — Z5181 Encounter for therapeutic drug level monitoring: Secondary | ICD-10-CM

## 2020-02-19 DIAGNOSIS — I480 Paroxysmal atrial fibrillation: Secondary | ICD-10-CM

## 2020-02-19 LAB — POCT INR: INR: 2.5 (ref 2.0–3.0)

## 2020-02-19 NOTE — Patient Instructions (Signed)
Continue warfarin 1 1/2 tablets daily except 2 tablet on Mondays and Fridays Recheck in 6 weeks in [place] office.   

## 2020-03-05 DIAGNOSIS — B351 Tinea unguium: Secondary | ICD-10-CM | POA: Diagnosis not present

## 2020-03-05 DIAGNOSIS — L851 Acquired keratosis [keratoderma] palmaris et plantaris: Secondary | ICD-10-CM | POA: Diagnosis not present

## 2020-03-05 DIAGNOSIS — I739 Peripheral vascular disease, unspecified: Secondary | ICD-10-CM | POA: Diagnosis not present

## 2020-03-05 DIAGNOSIS — G8190 Hemiplegia, unspecified affecting unspecified side: Secondary | ICD-10-CM | POA: Diagnosis not present

## 2020-03-09 ENCOUNTER — Other Ambulatory Visit: Payer: Self-pay

## 2020-03-10 ENCOUNTER — Encounter: Payer: Self-pay | Admitting: Internal Medicine

## 2020-03-10 ENCOUNTER — Ambulatory Visit (INDEPENDENT_AMBULATORY_CARE_PROVIDER_SITE_OTHER): Payer: Medicare Other | Admitting: Internal Medicine

## 2020-03-10 VITALS — BP 130/80 | HR 84 | Temp 98.8°F

## 2020-03-10 DIAGNOSIS — I700 Atherosclerosis of aorta: Secondary | ICD-10-CM

## 2020-03-10 DIAGNOSIS — I10 Essential (primary) hypertension: Secondary | ICD-10-CM | POA: Diagnosis not present

## 2020-03-10 DIAGNOSIS — N1831 Chronic kidney disease, stage 3a: Secondary | ICD-10-CM | POA: Diagnosis not present

## 2020-03-10 DIAGNOSIS — E538 Deficiency of other specified B group vitamins: Secondary | ICD-10-CM

## 2020-03-10 DIAGNOSIS — R739 Hyperglycemia, unspecified: Secondary | ICD-10-CM | POA: Diagnosis not present

## 2020-03-10 DIAGNOSIS — E782 Mixed hyperlipidemia: Secondary | ICD-10-CM

## 2020-03-10 DIAGNOSIS — C61 Malignant neoplasm of prostate: Secondary | ICD-10-CM

## 2020-03-10 LAB — LIPID PANEL
Cholesterol: 155 mg/dL (ref 0–200)
HDL: 39.5 mg/dL (ref >39.00)
NonHDL: 115.06
Total CHOL/HDL Ratio: 4
Triglycerides: 241 mg/dL — ABNORMAL HIGH (ref 0.0–149.0)
VLDL: 48.2 mg/dL — ABNORMAL HIGH (ref 0.0–40.0)

## 2020-03-10 LAB — VITAMIN B12: Vitamin B-12: >1506 pg/mL — ABNORMAL HIGH (ref 211–911)

## 2020-03-10 LAB — CBC WITH DIFFERENTIAL/PLATELET
Basophils Absolute: 0 10*3/uL (ref 0.0–0.1)
Basophils Relative: 0.5 % (ref 0.0–3.0)
Eosinophils Absolute: 0.1 10*3/uL (ref 0.0–0.7)
Eosinophils Relative: 1 % (ref 0.0–5.0)
HCT: 44.8 % (ref 39.0–52.0)
Hemoglobin: 15.1 g/dL (ref 13.0–17.0)
Lymphocytes Relative: 15.4 % (ref 12.0–46.0)
Lymphs Abs: 1.4 10*3/uL (ref 0.7–4.0)
MCHC: 33.8 g/dL (ref 30.0–36.0)
MCV: 94.2 fl (ref 78.0–100.0)
Monocytes Absolute: 0.9 10*3/uL (ref 0.1–1.0)
Monocytes Relative: 10 % (ref 3.0–12.0)
Neutro Abs: 6.6 10*3/uL (ref 1.4–7.7)
Neutrophils Relative %: 73.1 % (ref 43.0–77.0)
Platelets: 155 10*3/uL (ref 150.0–400.0)
RBC: 4.76 Mil/uL (ref 4.22–5.81)
RDW: 14.6 % (ref 11.5–15.5)
WBC: 9 10*3/uL (ref 4.0–10.5)

## 2020-03-10 LAB — TSH: TSH: 2.14 u[IU]/mL (ref 0.35–4.50)

## 2020-03-10 LAB — PSA: PSA: 1.81 ng/mL (ref 0.10–4.00)

## 2020-03-10 LAB — BASIC METABOLIC PANEL
BUN: 23 mg/dL (ref 6–23)
CO2: 27 mEq/L (ref 19–32)
Calcium: 9.3 mg/dL (ref 8.4–10.5)
Chloride: 106 mEq/L (ref 96–112)
Creatinine, Ser: 1.74 mg/dL — ABNORMAL HIGH (ref 0.40–1.50)
GFR: 35.4 mL/min — ABNORMAL LOW (ref >60.00)
Glucose, Bld: 104 mg/dL — ABNORMAL HIGH (ref 70–99)
Potassium: 4 mEq/L (ref 3.5–5.1)
Sodium: 141 mEq/L (ref 135–145)

## 2020-03-10 LAB — LDL CHOLESTEROL, DIRECT: Direct LDL: 80 mg/dL

## 2020-03-10 LAB — HEPATIC FUNCTION PANEL
ALT: 10 U/L (ref 0–53)
AST: 10 U/L (ref 0–37)
Albumin: 4.2 g/dL (ref 3.5–5.2)
Alkaline Phosphatase: 58 U/L (ref 39–117)
Bilirubin, Direct: 0.1 mg/dL (ref 0.0–0.3)
Total Bilirubin: 0.6 mg/dL (ref 0.2–1.2)
Total Protein: 6.8 g/dL (ref 6.0–8.3)

## 2020-03-10 LAB — VITAMIN D 25 HYDROXY (VIT D DEFICIENCY, FRACTURES): VITD: 42.93 ng/mL (ref 30.00–100.00)

## 2020-03-10 LAB — HEMOGLOBIN A1C: Hgb A1c MFr Bld: 6.3 % (ref 4.6–6.5)

## 2020-03-10 LAB — PHOSPHORUS: Phosphorus: 3.1 mg/dL (ref 2.3–4.6)

## 2020-03-10 NOTE — Progress Notes (Signed)
Patient ID: Austin [name], male   DOB: 29-Nov-1932, 87 y.o.   MRN: [id number]         Chief Complaint:: yearly exam and f/u prostate ca, aortic atherosclerosis, b12 deficiency, ckd, htn, hld       HPI:  Austin [name] is a 87 y.o. male here with above, overall doing well without new complaints.  Has been taking the b12 pills, Denies urinary symptoms such as dysuria, frequency, urgency, flank pain, hematuria or n/v, fever, chills.  Pt denies chest pain, increased sob or doe, wheezing, orthopnea, PND, increased LE swelling, palpitations, dizziness or syncope.  Pt denies new neurological symptoms such as new headache, or facial or extremity weakness or numbness.   Pt denies polydipsia, polyuria,.  Pt states overall good compliance with meds, trying to follow lower cholesterol diet   Wt Readings from Last 3 Encounters:  08/20/18 200 lb (90.7 kg)  03/04/18 180 lb (81.6 kg)  10/18/17 180 lb (81.6 kg)   BP Readings from Last 3 Encounters:  03/10/20 130/80  09/10/19 126/78  02/07/19 120/78   Immunization History  Administered Date(s) Administered  . Moderna Sars-Covid-2 Vaccination 03/01/2019, 04/01/2019, 01/12/2020   There are no preventive care reminders to display for this patient.  Past Medical History:  Diagnosis Date  . ADENOCARCINOMA, PROSTATE, GLEASON GRADE 5 01/21/2009  . ALLERGIC RHINITIS 01/14/2007  . Bilateral inguinal hernia   . Cholecystitis   . Cholelithiasis   . CKD (chronic kidney disease) stage 3, GFR 30-59 ml/min (HCC) 04/30/2015  . COLONIC POLYPS, HX OF 01/14/2007  . ELEVATED PROSTATE SPECIFIC ANTIGEN 03/27/2008  . ESOPHAGITIS 01/14/2007  . GERD (gastroesophageal reflux disease)   . HYPERLIPIDEMIA 01/14/2007  . HYPERTENSION 01/14/2007  . LIBIDO, DECREASED 01/15/2007  . PAF (paroxysmal atrial fibrillation) ([place])   . Paralysis ([place])    from [place] 02/2013   Past Surgical History:  Procedure Laterality Date  . CHOLECYSTECTOMY N/A 08/17/2017   Procedure: LAPAROSCOPIC  CHOLECYSTECTOMY WITH INTRAOPERATIVE CHOLANGIOGRAM ERAS PATHWAY;  Surgeon: Judeth Horn, MD;  Location: [facility];  Service: General;  Laterality: N/A;  . ENDOSCOPIC RETROGRADE CHOLANGIOPANCREATOGRAPHY (ERCP) WITH PROPOFOL N/A 08/18/2017   Procedure: ENDOSCOPIC RETROGRADE CHOLANGIOPANCREATOGRAPHY (ERCP) WITH PROPOFOL;  Surgeon: Doran Stabler, MD;  Location: [facility];  Service: Endoscopy;  Laterality: N/A;  . ERCP N/A 08/21/2017   Procedure: ENDOSCOPIC RETROGRADE CHOLANGIOPANCREATOGRAPHY (ERCP);  Surgeon: Clarene Essex, MD;  Location: [facility];  Service: Endoscopy;  Laterality: N/A;  . ESOPHAGOGASTRODUODENOSCOPY N/A 08/15/2013   Procedure: ESOPHAGOGASTRODUODENOSCOPY (EGD);  Surgeon: Gwenyth Ober, MD;  Location: [facility];  Service: General;  Laterality: N/A;  . ESOPHAGOGASTRODUODENOSCOPY (EGD) WITH PROPOFOL N/A 08/18/2017   Procedure: ESOPHAGOGASTRODUODENOSCOPY (EGD) WITH PROPOFOL;  Surgeon: Doran Stabler, MD;  Location: [facility];  Service: Endoscopy;  Laterality: N/A;  . HERNIA REPAIR     Bilateral Inguinal hernias  . HIP PINNING,CANNULATED Left 07/18/2017   Procedure: CANNULATED HIP PINNING;  Surgeon: Carole Civil, MD;  Location: [facility];  Service: Orthopedics;  Laterality: Left;  . HIP SURGERY     Left  . IR EXCHANGE BILIARY DRAIN  05/24/2017  . IR EXCHANGE BILIARY DRAIN  07/27/2017  . IR PERC CHOLECYSTOSTOMY  04/10/2017  . JOINT REPLACEMENT    . PANCREATIC STENT PLACEMENT  08/21/2017   Procedure: PANCREATIC STENT PLACEMENT;  Surgeon: Clarene Essex, MD;  Location[facility] END[facility]COPY;  Service: Endoscopy;;  . PEG PLACEMENT N/A 09/02/2013   Procedure: PERCUTANEOUS ENDOSCOPIC GASTROSTOMY (PEG) PLACEMENT;  Surgeon: Gwenyth Ober, MD;  [facility]: [facility];  Service: General;  Laterality: N/A;  . PROSTATE CRYOABLATION    . REMOVAL OF STONES  08/21/2017   Procedure: REMOVAL OF STONES;  Surgeon: Clarene Essex, MD;  Location: [facility] ENDOSCOPY;  Service: Endoscopy;;  . Austin [name]  08/21/2017    Procedure: Austin [name];  Surgeon: Clarene Essex, MD;  Location: [place];  Service: Endoscopy;;    reports that he has quit smoking. He has never used smokeless tobacco. He reports current alcohol use. He reports that he does not use drugs. family history includes Arthritis in an other family member; Hypertension in an other family member; Lung cancer in his father. No Known Allergies Current Outpatient Medications on File Prior to Visit  Medication Sig Dispense Refill  . acetaminophen (TYLENOL) 325 MG tablet Take 650 mg by mouth every 8 (eight) hours as needed for moderate pain.     . cholecalciferol (VITAMIN D) 1000 units tablet Take 2,000 Units by mouth daily.    . Control Gel Formula Dressing (DUODERM CGF DRESSING) MISC Apply 1 each topically daily as needed. 100 each 3  . diclofenac sodium (VOLTAREN) 1 % GEL Apply 2 g topically 4 (four) times daily as needed. 100 g 11  . diphenoxylate-atropine (LOMOTIL) 2.5-0.025 MG tablet Take 1 tablet by mouth 4 (four) times daily as needed for diarrhea or loose stools. 60 tablet 1  . Fluticasone-Salmeterol (ADVAIR DISKUS) 250-50 MCG/DOSE AEPB Inhale 1 puff into the lungs 2 (two) times daily. 1 each 11  . furosemide (LASIX) 20 MG tablet Take 20 mg by mouth daily.    [name] gabapentin (NEURONTIN) 100 MG capsule TAKE 2 CAPSULES BY MOUTH TWICE DAILY. 180 capsule 0  . gabapentin (NEURONTIN) 300 MG capsule TAKE 1 CAPSULE BY MOUTH AT BEDTIME. 90 capsule 0  . omeprazole (PRILOSEC) 40 MG capsule TAKE ONE CAPSULE BY MOUTH ONCE DAILY. 30 capsule 5  . rosuvastatin (CRESTOR) 10 MG tablet TAKE ONE TABLET BY MOUTH ONCE DAILY. 90 tablet 0  . sodium bicarbonate 650 MG tablet Take 1 tablet (650 mg total) by mouth 2 (two) times daily. 60 tablet 11  . tiZANidine (ZANAFLEX) 2 MG tablet TAKE 1 TABLET BY MOUTH TWICE DAILY 60 tablet 0  . vitamin B-12 (CYANOCOBALAMIN) 1000 MCG tablet Take 1 tablet (1,000 mcg total) by mouth daily. 90 tablet 3  . warfarin (COUMADIN) 5 MG tablet  TAKE 1 & 1/2- 2 TABLETS BY MOUTH DAILY OR AS DIRECTED 60 tablet 6  . K Phos Mono-Sod Phos Di & Mono (PHOSPHA 250 NEUTRAL) 220-254-270 MG TABS Take 2 tablets by mouth 2 (two) times daily.     No current facility-administered medications on file prior to visit.        ROS:  All others reviewed and negative.  Objective        PE:  BP 130/80   Pulse 84   Temp 98.8 F (37.1 C) (Oral)   SpO2 97%                 Constitutional: Pt appears in NAD               HENT: Head: NCAT.                Right Ear: External ear normal.                 Left Ear: External ear normal.                Eyes: . Pupils are equal, round, and  reactive to light. Conjunctivae and EOM are normal               Nose: without d/c or deformity               Neck: Neck supple. Gross normal ROM               Cardiovascular: Normal rate and regular rhythm.                 Pulmonary/Chest: Effort normal and breath sounds without rales or wheezing.                Abd:  Soft, NT, ND, + BS, no organomegaly               Neurological: [name] alert. At baseline orientation, motor grossly intact               Skin: Skin is warm. No rashes, no other new lesions, LE edema - trace bilat               Psychiatric: Pt behavior is normal without agitation   Micro: none  Cardiac tracings I have personally interpreted today:  none  Pertinent Radiological findings (summarize): none   Lab Results  Component Value Date   WBC 9.0 03/10/2020   HGB 15.1 03/10/2020   HCT 44.8 03/10/2020   PLT 155.0 03/10/2020   GLUCOSE 104 (H) 03/10/2020   CHOL 155 03/10/2020   TRIG 241.0 (H) 03/10/2020   HDL 39.50 03/10/2020   LDLDIRECT 80.0 03/10/2020   LDLCALC 110 (H) 04/03/2013   ALT 10 03/10/2020   AST 10 03/10/2020   NA 141 03/10/2020   K 4.0 03/10/2020   CL 106 03/10/2020   CREATININE 1.74 (H) 03/10/2020   BUN 23 03/10/2020   CO2 27 03/10/2020   TSH 2.14 03/10/2020   PSA 1.81 03/10/2020   INR 2.5 02/19/2020   HGBA1C 6.3 03/10/2020    Assessment/Plan:  Austin [name] is a 87 y.o. White or Caucasian [1] Black or African American [2] male with  has a past medical history of ADENOCARCINOMA, PROSTATE, GLEASON GRADE 5 (01/21/2009), ALLERGIC RHINITIS (01/14/2007), Bilateral inguinal hernia, Cholecystitis, Cholelithiasis, CKD (chronic kidney disease) stage 3, GFR 30-59 ml/min (HCC) (04/30/2015), COLONIC POLYPS, HX OF (01/14/2007), ELEVATED PROSTATE SPECIFIC ANTIGEN (03/27/2008), ESOPHAGITIS (01/14/2007), GERD (gastroesophageal reflux disease), HYPERLIPIDEMIA (01/14/2007), HYPERTENSION (01/14/2007), LIBIDO, DECREASED (01/15/2007), PAF (paroxysmal atrial fibrillation) ([place]), and Paralysis ([place]).  ADENOCARCINOMA, PROSTATE, GLEASON GRADE 5 Asympt, to f/u urology as planned, for psa f/u with next labs  Aortic atherosclerosis ([place]) To continue crestor 10 and low chol diet, to f/u any worsening symptoms or concerns  B12 deficiency Lab Results  Component Value Date   VITAMINB12 >1506 (H) 03/10/2020   Stable, cont oral replacement - b12 1000 mcg qd but at qod  CKD (chronic kidney disease) stage 3, GFR 30-59 ml/min (HCC) Lab Results  Component Value Date   CREATININE 1.74 (H) 03/10/2020   Stable overall, cont to avoid nephrotoxins, f/u renal as planned  Essential hypertension BP Readings from Last 3 Encounters:  03/10/20 130/80  09/10/19 126/78  02/07/19 120/78   Stable, pt to continue medical treatment  - lasix   Hyperlipidemia Lab Results  Component Value Date   LDLCALC 110 (H) 04/03/2013   Stable, pt to continue current statin crestor 10, for f/u lab, goal ldl < 70   Current Outpatient Medications (Cardiovascular):  .  furosemide (LASIX) 20 MG tablet, Take 20 mg by mouth  daily. .  rosuvastatin (CRESTOR) 10 MG tablet, TAKE ONE TABLET BY MOUTH ONCE DAILY.  Current Outpatient Medications (Respiratory):  [name]  Fluticasone-Salmeterol (ADVAIR DISKUS) 250-50 MCG/DOSE AEPB, Inhale 1 puff into the lungs 2 (two) times  daily.  Current Outpatient Medications (Analgesics):  .  acetaminophen (TYLENOL) 325 MG tablet, Take 650 mg by mouth every 8 (eight) hours as needed for moderate pain.   Current Outpatient Medications (Hematological):  .  vitamin B-12 (CYANOCOBALAMIN) 1000 MCG tablet, Take 1 tablet (1,000 mcg total) by mouth daily. [name]  warfarin (COUMADIN) 5 MG tablet, TAKE 1 & 1/2- 2 TABLETS BY MOUTH DAILY OR AS DIRECTED  Current Outpatient Medications (Other):  .  cholecalciferol (VITAMIN D) 1000 units tablet, Take 2,000 Units by mouth daily. .  Control Gel Formula Dressing (DUODERM CGF DRESSING) MISC, Apply 1 each topically daily as needed. .  diclofenac sodium (VOLTAREN) 1 % GEL, Apply 2 g topically 4 (four) times daily as needed. .  diphenoxylate-atropine (LOMOTIL) 2.5-0.025 MG tablet, Take 1 tablet by mouth 4 (four) times daily as needed for diarrhea or loose stools. .  gabapentin (NEURONTIN) 100 MG capsule, TAKE 2 CAPSULES BY MOUTH TWICE DAILY. [name]  gabapentin (NEURONTIN) 300 MG capsule, TAKE 1 CAPSULE BY MOUTH AT BEDTIME. [name]  omeprazole (PRILOSEC) 40 MG capsule, TAKE ONE CAPSULE BY MOUTH ONCE DAILY. .  sodium bicarbonate 650 MG tablet, Take 1 tablet (650 mg total) by mouth 2 (two) times daily. [name]  tiZANidine (ZANAFLEX) 2 MG tablet, TAKE 1 TABLET BY MOUTH TWICE DAILY .  K Phos Mono-Sod Phos Di & Mono (PHOSPHA 250 NEUTRAL) 155-852-130 MG TABS, Take 2 tablets by mouth 2 (two) times daily.   Followup: Return in about 6 months (around 09/07/2020).  Cathlean Cower, MD 03/14/2020 12:22 AM [facility]

## 2020-03-10 NOTE — Patient Instructions (Signed)

## 2020-03-12 LAB — URINALYSIS, ROUTINE W REFLEX MICROSCOPIC
Bilirubin Urine: NEGATIVE
Ketones, ur: NEGATIVE
Leukocytes,Ua: NEGATIVE
Nitrite: NEGATIVE
Specific Gravity, Urine: 1.02 (ref 1.000–1.030)
Total Protein, Urine: NEGATIVE
Urine Glucose: NEGATIVE
Urobilinogen, UA: 0.2 (ref 0.0–1.0)
pH: 7 (ref 5.0–8.0)

## 2020-03-12 LAB — PTH, INTACT AND CALCIUM
Calcium: 9.2 mg/dL (ref 8.6–10.3)
PTH: 143 pg/mL — ABNORMAL HIGH (ref 14–64)

## 2020-03-14 ENCOUNTER — Encounter: Payer: Self-pay | Admitting: Internal Medicine

## 2020-03-14 NOTE — Assessment & Plan Note (Addendum)
Asympt, to f/u urology as planned, for psa f/u with next labs

## 2020-03-14 NOTE — Assessment & Plan Note (Signed)
Lab Results  Component Value Date   CREATININE 1.74 (H) 03/10/2020   Stable overall, cont to avoid nephrotoxins, f/u renal as planned

## 2020-03-14 NOTE — Assessment & Plan Note (Signed)
Lab Results  Component Value Date   VITAMINB12 >1506 (H) 03/10/2020   Stable, cont oral replacement - b12 1000 mcg qd but at qod

## 2020-03-14 NOTE — Assessment & Plan Note (Signed)
Lab Results  Component Value Date   LDLCALC 110 (H) 04/03/2013   Stable, pt to continue current statin crestor 10, for f/u lab, goal ldl < 70   Current Outpatient Medications (Cardiovascular):  .  furosemide (LASIX) 20 MG tablet, Take 20 mg by mouth daily. .  rosuvastatin (CRESTOR) 10 MG tablet, TAKE ONE TABLET BY MOUTH ONCE DAILY.  Current Outpatient Medications (Respiratory):  [name]  Fluticasone-Salmeterol (ADVAIR DISKUS) 250-50 MCG/DOSE AEPB, Inhale 1 puff into the lungs 2 (two) times daily.  Current Outpatient Medications (Analgesics):  .  acetaminophen (TYLENOL) 325 MG tablet, Take 650 mg by mouth every 8 (eight) hours as needed for moderate pain.   Current Outpatient Medications (Hematological):  .  vitamin B-12 (CYANOCOBALAMIN) 1000 MCG tablet, Take 1 tablet (1,000 mcg total) by mouth daily. [name]  warfarin (COUMADIN) 5 MG tablet, TAKE 1 & 1/2- 2 TABLETS BY MOUTH DAILY OR AS DIRECTED  Current Outpatient Medications (Other):  .  cholecalciferol (VITAMIN D) 1000 units tablet, Take 2,000 Units by mouth daily. .  Control Gel Formula Dressing (DUODERM CGF DRESSING) MISC, Apply 1 each topically daily as needed. .  diclofenac sodium (VOLTAREN) 1 % GEL, Apply 2 g topically 4 (four) times daily as needed. .  diphenoxylate-atropine (LOMOTIL) 2.5-0.025 MG tablet, Take 1 tablet by mouth 4 (four) times daily as needed for diarrhea or loose stools. .  gabapentin (NEURONTIN) 100 MG capsule, TAKE 2 CAPSULES BY MOUTH TWICE DAILY. [name]  gabapentin (NEURONTIN) 300 MG capsule, TAKE 1 CAPSULE BY MOUTH AT BEDTIME. [name]  omeprazole (PRILOSEC) 40 MG capsule, TAKE ONE CAPSULE BY MOUTH ONCE DAILY. .  sodium bicarbonate 650 MG tablet, Take 1 tablet (650 mg total) by mouth 2 (two) times daily. [name]  tiZANidine (ZANAFLEX) 2 MG tablet, TAKE 1 TABLET BY MOUTH TWICE DAILY .  K Phos Mono-Sod Phos Di & Mono (PHOSPHA 250 NEUTRAL) 155-852-130 MG TABS, Take 2 tablets by mouth 2 (two) times daily.

## 2020-03-14 NOTE — Assessment & Plan Note (Signed)
BP Readings from Last 3 Encounters:  03/10/20 130/80  09/10/19 126/78  02/07/19 120/78   Stable, pt to continue medical treatment  - lasix

## 2020-03-14 NOTE — Assessment & Plan Note (Signed)
To continue crestor 10 and low chol diet, to f/u any worsening symptoms or concerns

## 2020-03-17 ENCOUNTER — Other Ambulatory Visit: Payer: Self-pay | Admitting: Internal Medicine

## 2020-03-17 DIAGNOSIS — E213 Hyperparathyroidism, unspecified: Secondary | ICD-10-CM

## 2020-03-24 DIAGNOSIS — N1832 Chronic kidney disease, stage 3b: Secondary | ICD-10-CM | POA: Diagnosis not present

## 2020-03-31 DIAGNOSIS — G839 Paralytic syndrome, unspecified: Secondary | ICD-10-CM | POA: Diagnosis not present

## 2020-03-31 DIAGNOSIS — I129 Hypertensive chronic kidney disease with stage 1 through stage 4 chronic kidney disease, or unspecified chronic kidney disease: Secondary | ICD-10-CM | POA: Diagnosis not present

## 2020-03-31 DIAGNOSIS — N2581 Secondary hyperparathyroidism of renal origin: Secondary | ICD-10-CM | POA: Diagnosis not present

## 2020-03-31 DIAGNOSIS — D631 Anemia in chronic kidney disease: Secondary | ICD-10-CM | POA: Diagnosis not present

## 2020-03-31 DIAGNOSIS — N1832 Chronic kidney disease, stage 3b: Secondary | ICD-10-CM | POA: Diagnosis not present

## 2020-04-01 ENCOUNTER — Ambulatory Visit (INDEPENDENT_AMBULATORY_CARE_PROVIDER_SITE_OTHER): Payer: Medicare Other | Admitting: *Deleted

## 2020-04-01 DIAGNOSIS — I4891 Unspecified atrial fibrillation: Secondary | ICD-10-CM | POA: Diagnosis not present

## 2020-04-01 DIAGNOSIS — Z5181 Encounter for therapeutic drug level monitoring: Secondary | ICD-10-CM | POA: Diagnosis not present

## 2020-04-01 LAB — POCT INR: INR: 2.9 (ref 2.0–3.0)

## 2020-04-01 NOTE — Patient Instructions (Signed)
Continue warfarin 1 1/2 tablets daily except 2 tablet on Mondays and Fridays Recheck in 6 weeks in [place] office.

## 2020-04-29 ENCOUNTER — Ambulatory Visit (INDEPENDENT_AMBULATORY_CARE_PROVIDER_SITE_OTHER): Payer: Medicare Other | Admitting: Endocrinology

## 2020-04-29 ENCOUNTER — Other Ambulatory Visit: Payer: Self-pay

## 2020-04-29 MED ORDER — CALCITRIOL 0.25 MCG PO CAPS: 0.2500 ug | ORAL_CAPSULE | Freq: Every day | ORAL | 3 refills | Status: AC

## 2020-04-29 NOTE — Patient Instructions (Signed)
Please continue the same medications. Dr Carolin Sicks will continue to follow the high parathyroid level.  I would be happy to see you back here as needed.

## 2020-04-29 NOTE — Progress Notes (Signed)
Subjective:    Patient ID: Austin [name], male    DOB: [birth year], 87 y.o.   MRN: [id number]  HPI [name] referred by Dr Jenny Reichmann, for hyperparathyroidism.  Wife provides hx, due to pt's memory loss.  Pt was noted to have hypocalcemia in 2019.  he denies h/o the following: urolithiasis, bariatric surgery, heart disease, osteoporosis, malabsorption, eating disorder, and chelation rx.  He takes Vitamin-D, 1000 units/d.  He also takes calcitriol 0.25 mcg/d.  Bony fractures were right skull, right hand (2015), and left hip (2019).  All with injuries.   Past Medical History:  Diagnosis Date  . ADENOCARCINOMA, PROSTATE, GLEASON GRADE 5 01/21/2009  . ALLERGIC RHINITIS 01/14/2007  . Bilateral inguinal hernia   . Cholecystitis   . Cholelithiasis   . CKD (chronic kidney disease) stage 3, GFR 30-59 ml/min (HCC) 04/30/2015  . COLONIC POLYPS, HX OF 01/14/2007  . ELEVATED PROSTATE SPECIFIC ANTIGEN 03/27/2008  . ESOPHAGITIS 01/14/2007  . GERD (gastroesophageal reflux disease)   . HYPERLIPIDEMIA 01/14/2007  . HYPERTENSION 01/14/2007  . LIBIDO, DECREASED 01/15/2007  . PAF (paroxysmal atrial fibrillation) ([place])   . Paralysis ([place])    from [place] 02/2013    Past Surgical History:  Procedure Laterality Date  . CHOLECYSTECTOMY N/A 08/17/2017   Procedure: LAPAROSCOPIC CHOLECYSTECTOMY WITH INTRAOPERATIVE CHOLANGIOGRAM ERAS PATHWAY;  Surgeon: Judeth Horn, MD;  Location: [facility];  Service: General;  Laterality: N/A;  . ENDOSCOPIC RETROGRADE CHOLANGIOPANCREATOGRAPHY (ERCP) WITH PROPOFOL N/A 08/18/2017   Procedure: ENDOSCOPIC RETROGRADE CHOLANGIOPANCREATOGRAPHY (ERCP) WITH PROPOFOL;  Surgeon: Doran Stabler, MD;  Location: [facility];  Service: Endoscopy;  Laterality: N/A;  . ERCP N/A 08/21/2017   Procedure: ENDOSCOPIC RETROGRADE CHOLANGIOPANCREATOGRAPHY (ERCP);  Surgeon: Clarene Essex, MD;  Location: [facility];  Service: Endoscopy;  Laterality: N/A;  . ESOPHAGOGASTRODUODENOSCOPY N/A 08/15/2013   Procedure:  ESOPHAGOGASTRODUODENOSCOPY (EGD);  Surgeon: Gwenyth Ober, MD;  Location: [facility];  Service: General;  Laterality: N/A;  . ESOPHAGOGASTRODUODENOSCOPY (EGD) WITH PROPOFOL N/A 08/18/2017   Procedure: ESOPHAGOGASTRODUODENOSCOPY (EGD) WITH PROPOFOL;  Surgeon: Doran Stabler, MD;  Location: [facility];  Service: Endoscopy;  Laterality: N/A;  . HERNIA REPAIR     Bilateral Inguinal hernias  . HIP PINNING,CANNULATED Left 07/18/2017   Procedure: CANNULATED HIP PINNING;  Surgeon: Carole Civil, MD;  Location: [facility];  Service: Orthopedics;  Laterality: Left;  . HIP SURGERY     Left  . IR EXCHANGE BILIARY DRAIN  05/24/2017  . IR EXCHANGE BILIARY DRAIN  07/27/2017  . IR PERC CHOLECYSTOSTOMY  04/10/2017  . JOINT REPLACEMENT    . PANCREATIC STENT PLACEMENT  08/21/2017   Procedure: PANCREATIC STENT PLACEMENT;  Surgeon: Clarene Essex, MD;  Location: [facility];  Service: Endoscopy;;  . PEG PLACEMENT N/A 09/02/2013   Procedure: PERCUTANEOUS ENDOSCOPIC GASTROSTOMY (PEG) PLACEMENT;  Surgeon: Gwenyth Ober, MD;  Location: [facility];  Service: General;  Laterality: N/A;  . PROSTATE CRYOABLATION    . REMOVAL OF STONES  08/21/2017   Procedure: REMOVAL OF STONES;  Surgeon: Clarene Essex, MD;  Location: [facility];  Service: Endoscopy;;  . Joan Mayans  08/21/2017   Procedure: Joan Mayans;  Surgeon: Clarene Essex, MD;  Location: [facility] ENDOSCOPY;  Service: Endoscopy;;    Social History   Socioeconomic History  . Marital status: Married    Spouse name: Not on file  . Number of children: 3  . Years of education: 62  . Highest education level: Not on file  Occupational History  . Occupation: Dietitian  Employer: [employer]  Tobacco Use  . Smoking status: Former Research scientist (life sciences)  . Smokeless tobacco: Never Used  Vaping Use  . Vaping Use: Never used  Substance and Sexual Activity  . Alcohol use: Yes    Comment: socially   . Drug use: No  . Sexual activity: Not Currently    Birth  control/protection: None  Other Topics Concern  . Not on file  Social History Narrative   ** Merged History Encounter **       Building services engineer. Married 1961. 2 sons- '63, '69 & 1 daughter '55   Grandchildren 5. 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 Working full time. Discussion needed in regard to Advance Care Planning-DNR/DNI, no artificial feeding or hydration, No HD, no heroic or futile.                Social Determinants of Health   Financial Resource Strain: Not on file  Food Insecurity: Not on file  Transportation Needs: Not on file  Physical Activity: Not on file  Stress: Not on file  Social Connections: Not on file  Intimate Partner Violence: Not on file    Current Outpatient Medications on File Prior to Visit  Medication Sig Dispense Refill  . acetaminophen (TYLENOL) 325 MG tablet Take 650 mg by mouth every 8 (eight) hours as needed for moderate pain.     . cholecalciferol (VITAMIN D) 1000 units tablet Take 2,000 Units by mouth daily.    . Control Gel Formula Dressing (DUODERM CGF DRESSING) MISC Apply 1 each topically daily as needed. 100 each 3  . diclofenac sodium (VOLTAREN) 1 % GEL Apply 2 g topically 4 (four) times daily as needed. 100 g 11  . diphenoxylate-atropine (LOMOTIL) 2.5-0.025 MG tablet Take 1 tablet by mouth 4 (four) times daily as needed for diarrhea or loose stools. 60 tablet 1  . Fluticasone-Salmeterol (ADVAIR DISKUS) 250-50 MCG/DOSE AEPB Inhale 1 puff into the lungs 2 (two) times daily. 1 each 11  . furosemide (LASIX) 20 MG tablet Take 20 mg by mouth daily.    [name] gabapentin (NEURONTIN) 100 MG capsule TAKE 2 CAPSULES BY MOUTH TWICE DAILY. 180 capsule 0  . gabapentin (NEURONTIN) 300 MG capsule TAKE 1 CAPSULE BY MOUTH AT BEDTIME. 90 capsule 0  . K Phos Mono-Sod Phos Di & Mono (PHOSPHA 250 NEUTRAL) 155-852-130 MG TABS Take 2 tablets by mouth 2 (two) times daily.    [name] omeprazole (PRILOSEC) 40 MG capsule TAKE ONE CAPSULE BY MOUTH ONCE DAILY. 30 capsule 5  .  rosuvastatin (CRESTOR) 10 MG tablet TAKE ONE TABLET BY MOUTH ONCE DAILY. 90 tablet 0  . sodium bicarbonate 650 MG tablet Take 1 tablet (650 mg total) by mouth 2 (two) times daily. 60 tablet 11  . tiZANidine (ZANAFLEX) 2 MG tablet TAKE 1 TABLET BY MOUTH TWICE DAILY 60 tablet 0  . vitamin B-12 (CYANOCOBALAMIN) 1000 MCG tablet Take 1 tablet (1,000 mcg total) by mouth daily. 90 tablet 3  . warfarin (COUMADIN) 5 MG tablet TAKE 1 & 1/2- 2 TABLETS BY MOUTH DAILY OR AS DIRECTED 60 tablet 6   No current facility-administered medications on file prior to visit.    No Known Allergies  Family History  Problem Relation Age of Onset  . Lung cancer Father   . Hypertension Other   . Arthritis Other     BP 138/70 (BP Location: Right Arm, Patient Position: Sitting, Cuff Size: Large)   Pulse 82   Ht 5' 6.5" (1.689 m)  SpO2 96%   BMI 31.80 kg/m     Review of Systems denies cramps, n/v, syncope, fatigue, palpitations, diarrhea, weight loss, and numbness.      Objective:   Physical Exam VS: see vs page GEN: no distress.  In [facility] HEAD: head: no deformity eyes: no periorbital swelling, no proptosis external nose and ears are normal NECK: supple, thyroid is not enlarged CHEST WALL: no kyphosis.   LUNGS: clear to auscultation CV: reg rate and rhythm, no murmur.  MUSCULOSKELETAL: gait is normal and steady EXTEMITIES: no deformity.  Trace bilat leg edema NEURO:  readily moves all 4's.  sensation is intact to touch on all 4's SKIN:  Normal texture and temperature.  No rash or suspicious lesion is visible.   NODES:  None palpable at the neck PSYCH: alert, well-oriented.  Does not appear anxious nor depressed.  Lab Results  Component Value Date   PTH 143 (H) 03/10/2020   CALCIUM 9.3 03/10/2020   CAION 1.00 (L) 04/09/2017   PHOS 3.1 03/10/2020   25-OH vit-D=43  Lab Results  Component Value Date   CREATININE 1.74 (H) 03/10/2020   BUN 23 03/10/2020   NA 141 03/10/2020   K 4.0 03/10/2020    CL 106 03/10/2020   CO2 27 03/10/2020   Lab Results  Component Value Date   ALT 10 03/10/2020   AST 10 03/10/2020   ALKPHOS 58 03/10/2020   BILITOT 0.6 03/10/2020   I have reviewed outside records, and summarized: Pt recently saw Dr Carolin Sicks, who manages hyperparathyroidism with rocaltrol.     Assessment & Plan:  Secondary hyperparathyroidism, new to me.  Uncontrolled, but he is prob not a candidate for more aggressive control.  Patient Instructions  Please continue the same medications. Dr Carolin Sicks will continue to follow the high parathyroid level.  I would be happy to see you back here as needed.

## 2020-05-13 ENCOUNTER — Ambulatory Visit (INDEPENDENT_AMBULATORY_CARE_PROVIDER_SITE_OTHER): Payer: Medicare Other | Admitting: *Deleted

## 2020-05-13 ENCOUNTER — Other Ambulatory Visit: Payer: Self-pay

## 2020-05-13 DIAGNOSIS — I4891 Unspecified atrial fibrillation: Secondary | ICD-10-CM | POA: Diagnosis not present

## 2020-05-13 DIAGNOSIS — Z5181 Encounter for therapeutic drug level monitoring: Secondary | ICD-10-CM | POA: Diagnosis not present

## 2020-05-13 LAB — POCT INR: INR: 3.3 — AB (ref 2.0–3.0)

## 2020-05-13 NOTE — Patient Instructions (Signed)
Hold warfarin tonight then resume 1 1/2 tablets daily except 2 tablet on Mondays and Fridays Recheck in 6 weeks in [place] office.

## 2020-05-14 DIAGNOSIS — I739 Peripheral vascular disease, unspecified: Secondary | ICD-10-CM | POA: Diagnosis not present

## 2020-05-14 DIAGNOSIS — G8190 Hemiplegia, unspecified affecting unspecified side: Secondary | ICD-10-CM | POA: Diagnosis not present

## 2020-05-14 DIAGNOSIS — B351 Tinea unguium: Secondary | ICD-10-CM | POA: Diagnosis not present

## 2020-06-23 ENCOUNTER — Ambulatory Visit (INDEPENDENT_AMBULATORY_CARE_PROVIDER_SITE_OTHER): Payer: Medicare Other

## 2020-06-23 ENCOUNTER — Other Ambulatory Visit: Payer: Self-pay

## 2020-06-23 DIAGNOSIS — Z0000 Encounter for general adult medical examination without abnormal findings: Secondary | ICD-10-CM | POA: Diagnosis not present

## 2020-06-23 NOTE — Patient Instructions (Signed)
Austin [name] , Thank you for taking time to come for your Medicare Wellness Visit. I appreciate your ongoing commitment to your health goals. Please review the following plan we discussed and let me know if I can assist you in the future.   Screening recommendations/referrals: Colonoscopy: no repeat due to age Recommended yearly ophthalmology/optometry visit for glaucoma screening and checkup Recommended yearly dental visit for hygiene and checkup  Vaccinations: Influenza vaccine: declined Pneumococcal vaccine: declined Tdap vaccine: declined Shingles vaccine: declined   Covid-19: Moderna 03/01/2019, 04/01/2019, 01/12/2020  Advanced directives: Documents on file.  Conditions/risks identified: Yes; Reviewed health maintenance screenings with patient today and relevant education, vaccines, and/or referrals were provided. Please continue to do your personal lifestyle choices by: daily care of teeth and gums, regular physical activity (goal should be 5 days a week for 30 minutes), eat a healthy diet, avoid tobacco and drug use, limiting any alcohol intake, taking a low-dose aspirin (if not allergic or have been advised by your provider otherwise) and taking vitamins and minerals as recommended by your provider. Continue doing brain stimulating activities (puzzles, reading, adult coloring books, staying active) to keep memory sharp. Continue to eat heart healthy diet (full of fruits, vegetables, whole grains, lean protein, water--limit salt, fat, and sugar intake) and increase physical activity as tolerated.  Next appointment: Please schedule your next Medicare Wellness Visit with your Nurse Health Advisor in 1 year by calling [phone].  Preventive Care 13 Years and Older, Male Preventive care refers to lifestyle choices and visits with your health care provider that can promote health and wellness. What does preventive care include?  A yearly physical exam. This is also called an annual well  check.  Dental exams once or twice a year.  Routine eye exams. Ask your health care provider how often you should have your eyes checked.  Personal lifestyle choices, including:  Daily care of your teeth and gums.  Regular physical activity.  Eating a healthy diet.  Avoiding tobacco and drug use.  Limiting alcohol use.  Practicing safe sex.  Taking low doses of aspirin every day.  Taking vitamin and mineral supplements as recommended by your health care provider. What happens during an annual well check? The services and screenings done by your health care provider during your annual well check will depend on your age, overall health, lifestyle risk factors, and family history of disease. Counseling  Your health care provider may ask you questions about your:  Alcohol use.  Tobacco use.  Drug use.  Emotional well-being.  Home and relationship well-being.  Sexual activity.  Eating habits.  History of falls.  Memory and ability to understand (cognition).  Work and work Statistician. Screening  You may have the following tests or measurements:  Height, weight, and BMI.  Blood pressure.  Lipid and cholesterol levels. These may be checked every 5 years, or more frequently if you are over 42 years old.  Skin check.  Lung cancer screening. You may have this screening every year starting at age 69 if you have a 30-pack-year history of smoking and currently smoke or have quit within the past 15 years.  Fecal occult blood test (FOBT) of the stool. You may have this test every year starting at age 50.  Flexible sigmoidoscopy or colonoscopy. You may have a sigmoidoscopy every 5 years or a colonoscopy every 10 years starting at age 88.  Prostate cancer screening. Recommendations will vary depending on your family history and other risks.  Hepatitis C blood  test.  Hepatitis B blood test.  Sexually transmitted disease (STD) testing.  Diabetes screening. This is  done by checking your blood sugar (glucose) after you have not eaten for a while (fasting). You may have this done every 1-3 years.  Abdominal aortic aneurysm (AAA) screening. You may need this if you are a current or former smoker.  Osteoporosis. You may be screened starting at age 70 if you are at high risk. Talk with your health care provider about your test results, treatment options, and if necessary, the need for more tests. Vaccines  Your health care provider may recommend certain vaccines, such as:  Influenza vaccine. This is recommended every year.  Tetanus, diphtheria, and acellular pertussis (Tdap, Td) vaccine. You may need a Td booster every 10 years.  Zoster vaccine. You may need this after age 70.  Pneumococcal 13-valent conjugate (PCV13) vaccine. One dose is recommended after age 51.  Pneumococcal polysaccharide (PPSV23) vaccine. One dose is recommended after age 37. Talk to your health care provider about which screenings and vaccines you need and how often you need them. This information is not intended to replace advice given to you by your health care provider. Make sure you discuss any questions you have with your health care provider. Document Released: 02/05/2015 Document Revised: 09/29/2015 Document Reviewed: 11/10/2014 Elsevier Interactive Patient Education  2017 [employer] Prevention in the Home Falls can cause injuries. They can happen to people of all ages. There are many things you can do to make your home safe and to help prevent falls. What can I do on the outside of my home?  Regularly fix the edges of walkways and driveways and fix any cracks.  Remove anything that might make you trip as you walk through a door, such as a raised step or threshold.  Trim any bushes or trees on the path to your home.  Use bright outdoor lighting.  Clear any walking paths of anything that might make someone trip, such as rocks or tools.  Regularly check to  see if handrails are loose or broken. Make sure that both sides of any steps have handrails.  Any raised decks and porches should have guardrails on the edges.  Have any leaves, snow, or ice cleared regularly.  Use sand or salt on walking paths during winter.  Clean up any spills in your garage right away. This includes oil or grease spills. What can I do in the bathroom?  Use night lights.  Install grab bars by the toilet and in the tub and shower. Do not use towel bars as grab bars.  Use non-skid mats or decals in the tub or shower.  If you need to sit down in the shower, use a plastic, non-slip stool.  Keep the floor dry. Clean up any water that spills on the floor as soon as it happens.  Remove soap buildup in the tub or shower regularly.  Attach bath mats securely with double-sided non-slip rug tape.  Do not have throw rugs and other things on the floor that can make you trip. What can I do in the bedroom?  Use night lights.  Make sure that you have a light by your bed that is easy to reach.  Do not use any sheets or blankets that are too big for your bed. They should not hang down onto the floor.  Have a firm chair that has side arms. You can use this for support while you get dressed.  Do not have throw rugs and other things on the floor that can make you trip. What can I do in the kitchen?  Clean up any spills right away.  Avoid walking on wet floors.  Keep items that you use a lot in easy-to-reach places.  If you need to reach something above you, use a strong step stool that has a grab bar.  Keep electrical cords out of the way.  Do not use floor polish or wax that makes floors slippery. If you must use wax, use non-skid floor wax.  Do not have throw rugs and other things on the floor that can make you trip. What can I do with my stairs?  Do not leave any items on the stairs.  Make sure that there are handrails on both sides of the stairs and use  them. Fix handrails that are broken or loose. Make sure that handrails are as long as the stairways.  Check any carpeting to make sure that it is firmly attached to the stairs. Fix any carpet that is loose or worn.  Avoid having throw rugs at the top or bottom of the stairs. If you do have throw rugs, attach them to the floor with carpet tape.  Make sure that you have a light switch at the top of the stairs and the bottom of the stairs. If you do not have them, ask someone to add them for you. What else can I do to help prevent falls?  Wear shoes that:  Do not have high heels.  Have rubber bottoms.  Are comfortable and fit you well.  Are closed at the toe. Do not wear sandals.  If you use a stepladder:  Make sure that it is fully opened. Do not climb a closed stepladder.  Make sure that both sides of the stepladder are locked into place.  Ask someone to hold it for you, if possible.  Clearly mark and make sure that you can see:  Any grab bars or handrails.  First and last steps.  Where the edge of each step is.  Use tools that help you move around (mobility aids) if they are needed. These include:  Canes.  Walkers.  Scooters.  Crutches.  Turn on the lights when you go into a dark area. Replace any light bulbs as soon as they burn out.  Set up your furniture so you have a clear path. Avoid moving your furniture around.  If any of your floors are uneven, fix them.  If there are any pets around you, be aware of where they are.  Review your medicines with your doctor. Some medicines can make you feel dizzy. This can increase your chance of falling. Ask your doctor what other things that you can do to help prevent falls. This information is not intended to replace advice given to you by your health care provider. Make sure you discuss any questions you have with your health care provider. Document Released: 11/05/2008 Document Revised: 06/17/2015 Document Reviewed:  02/13/2014 Elsevier Interactive Patient Education  2017 [employer].

## 2020-06-23 NOTE — Progress Notes (Signed)
I connected with Cecilio Asper today by telephone and verified that I am speaking with the correct person using two identifiers. Location patient: home Location provider: work Persons participating in the virtual visit: Terryn Redner, Lambert Jeanty (wife) and Lisette Abu, LPN.   I discussed the limitations, risks, security and privacy concerns of performing an evaluation and management service by telephone and the availability of in person appointments. I also discussed with the patient that there may be a patient responsible charge related to this service. The patient expressed understanding and verbally consented to this telephonic visit.    Interactive audio and video telecommunications were attempted between this provider and patient, however failed, due to patient having technical difficulties OR patient did not have access to video capability.  We continued and completed visit with audio only.  Some vital signs may be absent or patient reported.   Time Spent with patient on telephone encounter: 30 minutes  Subjective:   Austin [name] is a 87 y.o. male who presents for Medicare Annual/Subsequent preventive examination.  Review of Systems    No ROS. Medicare Wellness Visit. Additional risk factors are reflected in social history. Cardiac Risk Factors include: advanced age (>21mn, >>35women);hypertension;male gender;family history of premature cardiovascular disease     Objective:    There were no vitals filed for this visit. There is no height or weight on file to calculate BMI.  Advanced Directives 06/23/2020 05/19/2019 08/20/2018 09/05/2017 09/04/2017 09/03/2017 08/23/2017  Does Patient Have a Medical Advance Directive? Yes Yes Yes Yes Yes Yes Yes  Type of Advance Directive Living will;Healthcare Power of [place] Out of facility DNR (pink MOST or yellow form) Out of facility DNR (pink MOST or  yellow form) Out of facility DNR (pink MOST or yellow form) Out of facility DNR (pink MOST or yellow form)  Does patient want to make changes to medical advance directive? No - Patient declined No - Patient declined No - Patient declined No - Patient declined No - Patient declined No - Patient declined No - Patient declined  Copy of HCayucosin Chart? - No - copy requested No - copy requested No - copy requested No - copy requested No - copy requested No - copy requested  Some encounter information is confidential and restricted. Go to Review Flowsheets activity to see all data.    Current Medications (verified) Outpatient Encounter Medications as of 06/23/2020  Medication Sig  . acetaminophen (TYLENOL) 325 MG tablet Take 650 mg by mouth every 8 (eight) hours as needed for moderate pain.   . calcitRIOL (ROCALTROL) 0.25 MCG capsule Take 1 capsule (0.25 mcg total) by mouth daily.  . cholecalciferol (VITAMIN D) 1000 units tablet Take 2,000 Units by mouth daily.  . Control Gel Formula Dressing (DUODERM CGF DRESSING) MISC Apply 1 each topically daily as needed.  . diclofenac sodium (VOLTAREN) 1 % GEL Apply 2 g topically 4 (four) times daily as needed.  . diphenoxylate-atropine (LOMOTIL) 2.5-0.025 MG tablet Take 1 tablet by mouth 4 (four) times daily as needed for diarrhea or loose stools.  . Fluticasone-Salmeterol (ADVAIR DISKUS) 250-50 MCG/DOSE AEPB Inhale 1 puff into the lungs 2 (two) times daily.  . furosemide (LASIX) 20 MG tablet Take 20 mg by mouth daily.  .Marland Kitchengabapentin (NEURONTIN) 100 MG capsule TAKE 2 CAPSULES BY MOUTH TWICE DAILY.  .Marland Kitchengabapentin (NEURONTIN) 300 MG capsule TAKE 1 CAPSULE BY MOUTH AT BEDTIME.  .[name]  K Phos Mono-Sod Phos Di & Mono (PHOSPHA 250 NEUTRAL) 155-852-130 MG TABS Take 2 tablets by mouth 2 (two) times daily.  [name] omeprazole (PRILOSEC) 40 MG capsule TAKE ONE CAPSULE BY MOUTH ONCE DAILY.  . rosuvastatin (CRESTOR) 10 MG tablet TAKE ONE TABLET BY MOUTH ONCE DAILY.   . sodium bicarbonate 650 MG tablet Take 1 tablet (650 mg total) by mouth 2 (two) times daily.  [name] tiZANidine (ZANAFLEX) 2 MG tablet TAKE 1 TABLET BY MOUTH TWICE DAILY  . vitamin B-12 (CYANOCOBALAMIN) 1000 MCG tablet Take 1 tablet (1,000 mcg total) by mouth daily.  [name] warfarin (COUMADIN) 5 MG tablet TAKE 1 & 1/2- 2 TABLETS BY MOUTH DAILY OR AS DIRECTED   No facility-administered encounter medications on file as of 06/23/2020.    Allergies (verified) Patient has no known allergies.   History: Past Medical History:  Diagnosis Date  . ADENOCARCINOMA, PROSTATE, GLEASON GRADE 5 01/21/2009  . ALLERGIC RHINITIS 01/14/2007  . Bilateral inguinal hernia   . Cholecystitis   . Cholelithiasis   . CKD (chronic kidney disease) stage 3, GFR 30-59 ml/min (HCC) 04/30/2015  . COLONIC POLYPS, HX OF 01/14/2007  . ELEVATED PROSTATE SPECIFIC ANTIGEN 03/27/2008  . ESOPHAGITIS 01/14/2007  . GERD (gastroesophageal reflux disease)   . HYPERLIPIDEMIA 01/14/2007  . HYPERTENSION 01/14/2007  . LIBIDO, DECREASED 01/15/2007  . PAF (paroxysmal atrial fibrillation) ([place])   . Paralysis ([place])    from [place] 02/2013   Past Surgical History:  Procedure Laterality Date  . CHOLECYSTECTOMY N/A 08/17/2017   Procedure: LAPAROSCOPIC CHOLECYSTECTOMY WITH INTRAOPERATIVE CHOLANGIOGRAM ERAS PATHWAY;  Surgeon: Judeth Horn, MD;  Location: [facility] [place];  Service: General;  Laterality: N/A;  . ENDOSCOPIC RETROGRADE CHOLANGIOPANCREATOGRAPHY (ERCP) WITH PROPOFOL N/A 08/18/2017   Procedure: ENDOSCOPIC RETROGRADE CHOLANGIOPANCREATOGRAPHY (ERCP) WITH PROPOFOL;  Surgeon: Doran Stabler, MD;  Location: [place];  Service: Endoscopy;  Laterality: N/A;  . ERCP N/A 08/21/2017   Procedure: ENDOSCOPIC RETROGRADE CHOLANGIOPANCREATOGRAPHY (ERCP);  Surgeon: Clarene Essex, MD;  Location: [place];  Service: Endoscopy;  Laterality: N/A;  . ESOPHAGOGASTRODUODENOSCOPY N/A 08/15/2013   Procedure: ESOPHAGOGASTRODUODENOSCOPY (EGD);  Surgeon: Gwenyth Ober, MD;   Location: [place];  Service: General;  Laterality: N/A;  . ESOPHAGOGASTRODUODENOSCOPY (EGD) WITH PROPOFOL N/A 08/18/2017   Procedure: ESOPHAGOGASTRODUODENOSCOPY (EGD) WITH PROPOFOL;  Surgeon: Doran Stabler, MD;  Location: [facility];  Service: Endoscopy;  Laterality: N/A;  . HERNIA REPAIR     Bilateral Inguinal hernias  . HIP PINNING,CANNULATED Left 07/18/2017   Procedure: CANNULATED HIP PINNING;  Surgeon: Carole Civil, MD;  Location: [facility];  Service: Orthopedics;  Laterality: Left;  . HIP SURGERY     Left  . IR EXCHANGE BILIARY DRAIN  05/24/2017  . IR EXCHANGE BILIARY DRAIN  07/27/2017  . IR PERC CHOLECYSTOSTOMY  04/10/2017  . JOINT REPLACEMENT    . PANCREATIC STENT PLACEMENT  08/21/2017   Procedure: PANCREATIC STENT PLACEMENT;  Surgeon: Clarene Essex, MD;  Location: [place];  Service: Endoscopy;;  . PEG PLACEMENT N/A 09/02/2013   Procedure: PERCUTANEOUS ENDOSCOPIC GASTROSTOMY (PEG) PLACEMENT;  Surgeon: Gwenyth Ober, MD;  Location: [place] [facility];  Service: General;  Laterality: N/A;  . PROSTATE CRYOABLATION    . REMOVAL OF STONES  08/21/2017   Procedure: REMOVAL OF STONES;  Surgeon: Clarene Essex, MD;  Location: [place];  Service: Endoscopy;;  . Joan Mayans  08/21/2017   Procedure: Joan Mayans;  Surgeon: Clarene Essex, MD;  Location[facility]COPY;  Service: Endoscopy;;   Family History  Problem Relation Age of  Onset  . Lung cancer Father   . Hypertension Other   . Arthritis Other    Social History   Socioeconomic History  . Marital status: Married    Spouse name: Not on file  . Number of children: 3  . Years of education: 69  . Highest education level: Not on file  Occupational History  . Occupation: Forensic scientist: [employer]  Tobacco Use  . Smoking status: Former Research scientist (life sciences)  . Smokeless tobacco: Never Used  Vaping Use  . Vaping Use: Never used  Substance and Sexual Activity  . Alcohol use: Yes    Comment: socially   . Drug use:  No  . Sexual activity: Not Currently    Birth control/protection: None  Other Topics Concern  . Not on file  Social History Narrative   ** Merged History Encounter **       Building services engineer. Married 1961. 2 sons- '63, '69 & 1 daughter '55   Grandchildren 5. 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 Working full time. Discussion needed in regard to Advance Care Planning-DNR/DNI, no artificial feeding or hydration, No HD, no heroic or futile.                Social Determinants of Health   Financial Resource Strain: Low Risk   . Difficulty of Paying Living Expenses: Not hard at all  Food Insecurity: No Food Insecurity  . Worried About Charity fundraiser in the Last Year: Never true  . Ran Out of Food in the Last Year: Never true  Transportation Needs: No Transportation Needs  . Lack of Transportation (Medical): No  . Lack of Transportation (Non-Medical): No  Physical Activity: Inactive  . Days of Exercise per Week: 0 days  . Minutes of Exercise per Session: 0 min  Stress: No Stress Concern Present  . Feeling of Stress : Not at all  Social Connections: Socially Integrated  . Frequency of Communication with Friends and Family: More than three times a week  . Frequency of Social Gatherings with Friends and Family: Once a week  . Attends Religious Services: 1 to 4 times per year  . Active Member of Clubs or Organizations: Yes  . Attends Archivist Meetings: 1 to 4 times per year  . Marital Status: Married    Tobacco Counseling Counseling given: Not Answered   Clinical Intake:  Pre-visit preparation completed: Yes  Pain : No/denies pain     Nutritional Risks: None Diabetes: No  How often do you need to have someone help you when you read instructions, pamphlets, or other written materials from your doctor or pharmacy?: 1 - Never What is the last grade level you completed in school?: 2 years of collge; [place] ([place]  Diabetic?  no  Interpreter Needed?: No  Information entered by :: Lisette Abu, LPN   Activities of Daily Living In your present state of health, do you have any difficulty performing the following activities: 06/23/2020  Hearing? Y  Vision? N  Difficulty concentrating or making decisions? N  Walking or climbing stairs? Y  Dressing or bathing? Y  Doing errands, shopping? Y  Preparing Food and eating ? Y  Using the Toilet? Y  In the past six months, have you accidently leaked urine? Y  Do you have problems with loss of bowel control? Y  Managing your Medications? Y  Managing your Finances? Y  Housekeeping or managing your Housekeeping? Y  Some recent  data might be hidden    Patient Care Team: [name], MD as PCP - General (Internal Medicine) Prentiss Bells, MD (Ophthalmology) [place], Clarene Duke, OT as Occupational Therapist (Occupational Therapy) Lonna Cobb, [place] (Inactive) as Physical Therapist (Physical Therapy) Susy Frizzle, PTA as Physical Therapy Assistant (Physical Therapy) Opthamology, [place] as Consulting Physician (Ophthalmology)  Indicate any recent Medical Services you may have received from other than [facility] providers in the past year (date may be approximate).     Assessment:   This is a routine wellness examination for Rollie.  Hearing/Vision screen No exam data present  Dietary issues and exercise activities discussed: Current Exercise Habits: The patient does not participate in regular exercise at present, Exercise limited by: cardiac condition(s);respiratory conditions(s)  Goals Addressed            This Visit's Progress   . Patient Stated       I would like to walk on my own again without assistance.      Depression Screen PHQ 2/9 Scores 06/23/2020 03/10/2020 05/19/2019 02/07/2019 08/08/2018 07/16/2017 11/22/2016  PHQ - 2 Score 0 0 0 0 1 0 0  PHQ- 9 Score - - - - - - -    Fall Risk Fall Risk  06/23/2020 03/10/2020 05/19/2019 02/07/2019  08/08/2018  Falls in the past year? 0 0 0 0 0  Number falls in past yr: 0 - 0 - -  Injury with Fall? 0 - 0 - -  Risk Factor Category  - - - - -  Risk for fall due to : No Fall Risks - Impaired mobility;Impaired balance/gait;Orthopedic patient - -  Risk for fall due to: Comment - - - - -  Follow up Falls evaluation completed - Falls evaluation completed;Education provided - -  Comment - - - - -    FALL RISK PREVENTION PERTAINING TO THE HOME:  Any stairs in or around the home? No  If so, are there any without handrails? No  Home free of loose throw rugs in walkways, pet beds, electrical cords, etc? Yes  Adequate lighting in your home to reduce risk of falls? Yes   ASSISTIVE DEVICES UTILIZED TO PREVENT FALLS:  Life alert? No  Use of a cane, walker or w/c? Yes  Grab bars in the bathroom? Yes  Shower chair or bench in shower? Yes  Elevated toilet seat or a handicapped toilet? Yes   TIMED UP AND GO:  Was the test performed? No .  Length of time to ambulate 10 feet: 0 sec.   Gait slow and steady with assistive device: per wife  Cognitive Function: No flowsheet data found.         Immunizations Immunization History  Administered Date(s) Administered  . Moderna Sars-Covid-2 Vaccination 03/01/2019, 04/01/2019, 01/12/2020    TDAP status: Due, Education has been provided regarding the importance of this vaccine. Advised may receive this vaccine at local pharmacy or Health Dept. Aware to provide a copy of the vaccination record if obtained from local pharmacy or Health Dept. Verbalized acceptance and understanding.  Flu Vaccine status: Declined, Education has been provided regarding the importance of this vaccine but patient still declined. Advised may receive this vaccine at local pharmacy or Health Dept. Aware to provide a copy of the vaccination record if obtained from local pharmacy or Health Dept. Verbalized acceptance and understanding.  Pneumococcal vaccine status: Declined,   Education has been provided regarding the importance of this vaccine but patient still declined. Advised may receive this  vaccine at local pharmacy or Health Dept. Aware to provide a copy of the vaccination record if obtained from local pharmacy or Health Dept. Verbalized acceptance and understanding.   Covid-19 vaccine status: Completed vaccines  Qualifies for Shingles Vaccine? Yes   Zostavax completed No   Shingrix Completed?: No.    Education has been provided regarding the importance of this vaccine. Patient has been advised to call insurance company to determine out of pocket expense if they have not yet received this vaccine. Advised may also receive vaccine at local pharmacy or Health Dept. Verbalized acceptance and understanding.  Screening Tests Health Maintenance  Topic Date Due  . Zoster Vaccines- Shingrix (1 of 2) Never done  . COVID-19 Vaccine (4 - Booster for Moderna series) 04/11/2020  . TETANUS/TDAP  02/04/2022  . HPV VACCINES  Aged Out  . INFLUENZA VACCINE  Discontinued  . PNA vac Low Risk Adult  Discontinued    Health Maintenance  Health Maintenance Due  Topic Date Due  . Zoster Vaccines- Shingrix (1 of 2) Never done  . COVID-19 Vaccine (4 - Booster for Moderna series) 04/11/2020    Colorectal cancer screening: No longer required.   Lung Cancer Screening: (Low Dose CT Chest recommended if Age 9-80 years, 30 pack-year currently smoking OR have quit w/in 15years.) does not qualify.   Lung Cancer Screening Referral: no  Additional Screening:  Hepatitis C Screening: does not qualify; Completed no  Vision Screening: Recommended annual ophthalmology exams for early detection of glaucoma and other disorders of the eye. Is the patient up to date with their annual eye exam?  Yes  Who is the provider or what is the name of the office in which the patient attends annual eye exams? [facility]  If pt is not established with a provider, would they like to  be referred to a provider to establish care? No .   Dental Screening: Recommended annual dental exams for proper oral hygiene  Community Resource Referral / Chronic Care Management: CRR required this visit?  No   CCM required this visit?  No      Plan:     I have personally reviewed and noted the following in the patient's chart:   . Medical and social history . Use of alcohol, tobacco or illicit drugs  . Current medications and supplements including opioid prescriptions. Patient is not currently taking opioid prescriptions. . Functional ability and status . Nutritional status . Physical activity . Advanced directives . List of other physicians . Hospitalizations, surgeries, and ER visits in previous 12 months . Vitals . Screenings to include cognitive, depression, and falls . Referrals and appointments  In addition, I have reviewed and discussed with patient certain preventive protocols, quality metrics, and best practice recommendations. A written personalized care plan for preventive services as well as general preventive health recommendations were provided to patient.     Sheral Flow, LPN   02/24/2977   Nurse Notes:  Patient is cogitatively intact. There were no vitals filed for this visit. There is no height or weight on file to calculate BMI.

## 2020-06-25 ENCOUNTER — Other Ambulatory Visit: Payer: Self-pay | Admitting: Nurse Practitioner

## 2020-06-25 DIAGNOSIS — U071 COVID-19: Secondary | ICD-10-CM

## 2020-06-25 MED ORDER — MOLNUPIRAVIR EUA 200MG CAPSULE: 4.0000 | ORAL_CAPSULE | Freq: Two times a day (BID) | ORAL | 0 refills | Status: DC

## 2020-06-25 MED ORDER — MOLNUPIRAVIR EUA 200MG CAPSULE: 4.0000 | ORAL_CAPSULE | Freq: Two times a day (BID) | ORAL | 0 refills | Status: AC

## 2020-06-25 NOTE — Progress Notes (Signed)
Spoke with patient. He is unable to se PCP before next week. He is on day 5 of symptoms today and tested positive for covid today. Will order molnupirivir and follow up with televisit Monday morning.

## 2020-06-28 ENCOUNTER — Telehealth (INDEPENDENT_AMBULATORY_CARE_PROVIDER_SITE_OTHER): Payer: Medicare Other | Admitting: Nurse Practitioner

## 2020-06-28 DIAGNOSIS — U071 COVID-19: Secondary | ICD-10-CM | POA: Diagnosis not present

## 2020-06-28 NOTE — Patient Instructions (Signed)
Covid 19 Cough:   Stay well hydrated  Stay active  Deep breathing exercises  May take tylenol or fever or pain  May take mucinex DM twice daily    Follow up:  Follow up with PCP

## 2020-06-28 NOTE — Progress Notes (Signed)
Virtual Visit via Telephone Note  I connected with Austin [name] on 06/28/20 at  8:30 AM EDT by telephone and verified that I am speaking with the correct person using two identifiers.  Location: Patient: home Provider: office   I discussed the limitations, risks, security and privacy concerns of performing an evaluation and management service by telephone and the availability of in person appointments. I also discussed with the patient that there may be a patient responsible charge related to this service. The patient expressed understanding and agreed to proceed.   History of Present Illness:  Patient presents today for post-COVID care clinic visit/televisit.  Patient's wife is providing information for visit today.  She states that her husband has been doing much better.  She states that his symptoms started on 06/21/2020.  He was prescribed molnupirivir this past Friday but she did not give him the medication because she usually crushes his medication.  We discussed that he is out of the window to receive the treatment at this point.  She states that he did have a cough that is much improved and he has not had a fever in the past few days.  She states that his appetite has improved and he is staying well-hydrated. Denies f/c/s, n/v/d, hemoptysis, PND, chest pain or edema.     Observations/Objective:  Vitals with BMI 04/29/2020 03/10/2020 09/10/2019  Height 5' 6.5" - 5' 6.5"  Weight (No Data) - -  BMI - - -  Systolic 242 353 614  Diastolic 70 80 78  Pulse 82 84 70  Some encounter information is confidential and restricted. Go to Review Flowsheets activity to see all data.      Assessment and Plan:  Covid 19 Cough:   Stay well hydrated  Stay active  Deep breathing exercises  May take tylenol or fever or pain  May take mucinex DM twice daily    Follow up:  Follow up with PCP     I discussed the assessment and treatment plan with the patient. The patient was  provided an opportunity to ask questions and all were answered. The patient agreed with the plan and demonstrated an understanding of the instructions.   The patient was advised to call back or seek an in-person evaluation if the symptoms worsen or if the condition fails to improve as anticipated.  I provided 23 minutes of non-face-to-face time during this encounter.   Fenton Foy, NP

## 2020-06-29 ENCOUNTER — Telehealth: Payer: Self-pay | Admitting: Internal Medicine

## 2020-06-29 MED ORDER — HYDROCODONE BIT-HOMATROP MBR 5-1.5 MG/5ML PO SOLN: 5.0000 mL | Freq: Four times a day (QID) | ORAL | 0 refills | Status: AC | PRN

## 2020-06-29 NOTE — Telephone Encounter (Signed)
   Spouse calling to report patient tested positive for COVID 6/3. Seeking advice for cough

## 2020-06-29 NOTE — Telephone Encounter (Signed)
Ok cough med sent to pharmacy

## 2020-06-30 NOTE — Telephone Encounter (Signed)
Notified the patients wife about the meds sent to the pharmacy.

## 2020-07-05 ENCOUNTER — Ambulatory Visit (INDEPENDENT_AMBULATORY_CARE_PROVIDER_SITE_OTHER): Payer: Medicare Other | Admitting: *Deleted

## 2020-07-05 DIAGNOSIS — Z5181 Encounter for therapeutic drug level monitoring: Secondary | ICD-10-CM

## 2020-07-05 DIAGNOSIS — I4891 Unspecified atrial fibrillation: Secondary | ICD-10-CM

## 2020-07-05 LAB — POCT INR: INR: 3.5 — AB (ref 2.0–3.0)

## 2020-07-05 NOTE — Patient Instructions (Signed)
Had covid 2 wks ago Hold warfarin tonight then decrease dose to 1 1/2 tablets daily  Recheck in 4 weeks in [place] office.

## 2020-08-01 DIAGNOSIS — Z20822 Contact with and (suspected) exposure to covid-19: Secondary | ICD-10-CM | POA: Diagnosis not present

## 2020-08-02 ENCOUNTER — Ambulatory Visit (INDEPENDENT_AMBULATORY_CARE_PROVIDER_SITE_OTHER): Payer: Medicare Other | Admitting: *Deleted

## 2020-08-02 ENCOUNTER — Other Ambulatory Visit: Payer: Self-pay

## 2020-08-02 DIAGNOSIS — Z5181 Encounter for therapeutic drug level monitoring: Secondary | ICD-10-CM

## 2020-08-02 DIAGNOSIS — I4891 Unspecified atrial fibrillation: Secondary | ICD-10-CM

## 2020-08-02 LAB — POCT INR: INR: 2.9 (ref 2.0–3.0)

## 2020-08-02 NOTE — Patient Instructions (Signed)
Continue warfarin 1 1/2 tablets daily  Recheck in 4 weeks in [place] office.

## 2020-08-09 ENCOUNTER — Other Ambulatory Visit: Payer: Self-pay | Admitting: Internal Medicine

## 2020-08-09 NOTE — Telephone Encounter (Signed)
Please refill as per office routine med refill policy (all routine meds refilled for 3 mo or monthly per pt preference up to one year from last visit, then month to month grace period for 3 mo, then further med refills will have to be denied)  

## 2020-08-20 DIAGNOSIS — B351 Tinea unguium: Secondary | ICD-10-CM | POA: Diagnosis not present

## 2020-08-20 DIAGNOSIS — I739 Peripheral vascular disease, unspecified: Secondary | ICD-10-CM | POA: Diagnosis not present

## 2020-08-20 DIAGNOSIS — G8190 Hemiplegia, unspecified affecting unspecified side: Secondary | ICD-10-CM | POA: Diagnosis not present

## 2020-08-30 ENCOUNTER — Ambulatory Visit (INDEPENDENT_AMBULATORY_CARE_PROVIDER_SITE_OTHER): Payer: Medicare Other | Admitting: *Deleted

## 2020-08-30 DIAGNOSIS — Z5181 Encounter for therapeutic drug level monitoring: Secondary | ICD-10-CM | POA: Diagnosis not present

## 2020-08-30 DIAGNOSIS — I4891 Unspecified atrial fibrillation: Secondary | ICD-10-CM | POA: Diagnosis not present

## 2020-08-30 LAB — POCT INR: INR: 2.8 (ref 2.0–3.0)

## 2020-08-30 NOTE — Patient Instructions (Signed)
Continue warfarin 1 1/2 tablets daily  Recheck in 6 weeks in [place] office.

## 2020-09-03 ENCOUNTER — Ambulatory Visit: Payer: Medicare Other | Admitting: Internal Medicine

## 2020-09-03 ENCOUNTER — Encounter: Payer: Self-pay | Admitting: Internal Medicine

## 2020-09-03 ENCOUNTER — Other Ambulatory Visit: Payer: Self-pay

## 2020-09-03 ENCOUNTER — Ambulatory Visit (INDEPENDENT_AMBULATORY_CARE_PROVIDER_SITE_OTHER): Payer: Medicare Other | Admitting: Internal Medicine

## 2020-09-03 VITALS — BP 146/80 | HR 72 | Temp 98.5°F | Ht 66.5 in

## 2020-09-03 DIAGNOSIS — T17308A Unspecified foreign body in larynx causing other injury, initial encounter: Secondary | ICD-10-CM | POA: Diagnosis not present

## 2020-09-03 NOTE — Patient Instructions (Signed)
    Avoid some of the foods that have caused choking.   If you want to do the swallow evaluation please let us know.
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
## 2020-09-15 ENCOUNTER — Other Ambulatory Visit: Payer: Self-pay

## 2020-09-15 ENCOUNTER — Encounter: Payer: Self-pay | Admitting: Internal Medicine

## 2020-09-15 ENCOUNTER — Ambulatory Visit (INDEPENDENT_AMBULATORY_CARE_PROVIDER_SITE_OTHER): Payer: Medicare Other | Admitting: Internal Medicine

## 2020-09-15 VITALS — BP 150/82 | HR 87 | Temp 98.7°F | Ht 66.5 in

## 2020-09-15 DIAGNOSIS — G8114 Spastic hemiplegia affecting left nondominant side: Secondary | ICD-10-CM

## 2020-09-15 DIAGNOSIS — R269 Unspecified abnormalities of gait and mobility: Secondary | ICD-10-CM

## 2020-09-15 DIAGNOSIS — N1831 Chronic kidney disease, stage 3a: Secondary | ICD-10-CM | POA: Diagnosis not present

## 2020-09-15 DIAGNOSIS — E538 Deficiency of other specified B group vitamins: Secondary | ICD-10-CM

## 2020-09-15 DIAGNOSIS — I10 Essential (primary) hypertension: Secondary | ICD-10-CM

## 2020-09-15 DIAGNOSIS — E782 Mixed hyperlipidemia: Secondary | ICD-10-CM | POA: Diagnosis not present

## 2020-09-15 NOTE — Progress Notes (Signed)
Patient ID: Austin [name], male   DOB: [birth year], 87 y.o.   MRN: [id number]        Chief Complaint: follow up HTN, ckd, gait d/o, b12 deficiency, hld, generalized weakness       HPI:  Austin [name] is a 87 y.o. male here overall ok but with worsening generalized weakness; no falls but admits PT at home may be needed to increased risk.   BP at home has been < 140/90.  Pt denies chest pain, increased sob or doe, wheezing, orthopnea, PND, increased LE swelling, palpitations, dizziness or syncope.   Pt denies polydipsia, polyuria, or new focal neuro s/s.   Pt denies fever, wt loss, night sweats, loss of appetite, or other constitutional symptoms   Had choking with eating solids x 3 recently but better with soft diet per wife.  No new neuro s/s.  No aspiration, fever, cough or sob.  No symptoms in past few days.  Also, S/p covid infection early June 2022   No other new complaints  Wt Readings from Last 3 Encounters:  08/20/18 200 lb (90.7 kg)  03/04/18 180 lb (81.6 kg)  10/18/17 180 lb (81.6 kg)   BP Readings from Last 3 Encounters:  09/15/20 (!) 150/82  09/03/20 (!) 146/80  04/29/20 138/70         Past Medical History:  Diagnosis Date   ADENOCARCINOMA, PROSTATE, GLEASON GRADE 5 01/21/2009   ALLERGIC RHINITIS 01/14/2007   Bilateral inguinal hernia    Cholecystitis    Cholelithiasis    CKD (chronic kidney disease) stage 3, GFR 30-59 ml/min (HCC) 04/30/2015   COLONIC POLYPS, HX OF 01/14/2007   ELEVATED PROSTATE SPECIFIC ANTIGEN 03/27/2008   ESOPHAGITIS 01/14/2007   GERD (gastroesophageal reflux disease)    HYPERLIPIDEMIA 01/14/2007   HYPERTENSION 01/14/2007   LIBIDO, DECREASED 01/15/2007   PAF (paroxysmal atrial fibrillation) ([place])    Paralysis ([place])    from [place] 02/2013   Past Surgical History:  Procedure Laterality Date   CHOLECYSTECTOMY N/A 08/17/2017   Procedure: LAPAROSCOPIC CHOLECYSTECTOMY WITH INTRAOPERATIVE CHOLANGIOGRAM ERAS PATHWAY;  Surgeon: Judeth Horn, MD;  Location: [place];  Service: General;  Laterality: N/A;   ENDOSCOPIC RETROGRADE CHOLANGIOPANCREATOGRAPHY (ERCP) WITH PROPOFOL N/A 08/18/2017   Procedure: ENDOSCOPIC RETROGRADE CHOLANGIOPANCREATOGRAPHY (ERCP) WITH PROPOFOL;  Surgeon: Doran Stabler, MD;  Location: [place] [facility];  Service: Endoscopy;  Laterality: N/A;   ERCP N/A 08/21/2017   Procedure: ENDOSCOPIC RETROGRADE CHOLANGIOPANCREATOGRAPHY (ERCP);  Surgeon: Clarene Essex, MD;  Location: [place];  Service: Endoscopy;  Laterality: N/A;   ESOPHAGOGASTRODUODENOSCOPY N/A 08/15/2013   Procedure: ESOPHAGOGASTRODUODENOSCOPY (EGD);  Surgeon: Gwenyth Ober, MD;  Location: [facility] ENDOSCOPY;  Service: General;  Laterality: N/A;   ESOPHAGOGASTRODUODENOSCOPY (EGD) WITH PROPOFOL N/A 08/18/2017   Procedure: ESOPHAGOGASTRODUODENOSCOPY (EGD) WITH PROPOFOL;  Surgeon: Doran Stabler, MD;  Location: [place];  Service: Endoscopy;  Laterality: N/A;   HERNIA REPAIR     Bilateral Inguinal hernias   HIP PINNING,CANNULATED Left 07/18/2017   Procedure: CANNULATED HIP PINNING;  Surgeon: Carole Civil, MD;  [facility];  Service: Orthopedics;  Laterality: Left;   HIP SURGERY     Left   IR EXCHANGE BILIARY DRAIN  05/24/2017   IR EXCHANGE BILIARY DRAIN  07/27/2017   IR PERC CHOLECYSTOSTOMY  04/10/2017   JOINT REPLACEMENT     PANCREATIC STENT PLACEMENT  08/21/2017   Procedure: PANCREATIC STENT PLACEMENT;  Surgeon: Clarene Essex, MD;  Location: [place];  Service: Endoscopy;;   PEG PLACEMENT N/A  09/02/2013   Procedure: PERCUTANEOUS ENDOSCOPIC GASTROSTOMY (PEG) PLACEMENT;  Surgeon: Gwenyth Ober, MD;  Location: [facility] ENDOSCOPY;  Service: General;  Laterality: N/A;   PROSTATE CRYOABLATION     REMOVAL OF STONES  08/21/2017   Procedure: REMOVAL OF STONES;  Surgeon: Clarene Essex, MD;  Location: [facility] ENDOSCOPY;  Service: Endoscopy;;   SPHINCTEROTOMY  08/21/2017   Procedure: Joan Mayans;  Surgeon: Clarene Essex, MD;  Location: [place];  Service: Endoscopy;;    reports that he  has quit smoking. He has never used smokeless tobacco. He reports current alcohol use. He reports that he does not use drugs. family history includes Arthritis in an other family member; Hypertension in an other family member; Lung cancer in his father. No Known Allergies Current Outpatient Medications on File Prior to Visit  Medication Sig Dispense Refill   acetaminophen (TYLENOL) 325 MG tablet Take 650 mg by mouth every 8 (eight) hours as needed for moderate pain.      calcitRIOL (ROCALTROL) 0.25 MCG capsule Take 1 capsule (0.25 mcg total) by mouth daily. 90 capsule 3   cholecalciferol (VITAMIN D) 1000 units tablet Take 2,000 Units by mouth daily.     Control Gel Formula Dressing (DUODERM CGF DRESSING) MISC Apply 1 each topically daily as needed. 100 each 3   diclofenac sodium (VOLTAREN) 1 % GEL Apply 2 g topically 4 (four) times daily as needed. 100 g 11   diphenoxylate-atropine (LOMOTIL) 2.5-0.025 MG tablet Take 1 tablet by mouth 4 (four) times daily as needed for diarrhea or loose stools. 60 tablet 1   furosemide (LASIX) 20 MG tablet Take 20 mg by mouth daily.     K Phos Mono-Sod Phos Di & Mono (PHOSPHA 250 NEUTRAL) 155-852-130 MG TABS Take 2 tablets by mouth 2 (two) times daily.     omeprazole (PRILOSEC) 40 MG capsule TAKE ONE CAPSULE BY MOUTH ONCE DAILY. 30 capsule 5   rosuvastatin (CRESTOR) 10 MG tablet TAKE ONE TABLET BY MOUTH ONCE DAILY. 90 tablet 0   sodium bicarbonate 650 MG tablet Take 1 tablet (650 mg total) by mouth 2 (two) times daily. 60 tablet 11   vitamin B-12 (CYANOCOBALAMIN) 1000 MCG tablet Take 1 tablet (1,000 mcg total) by mouth daily. 90 tablet 3   warfarin (COUMADIN) 5 MG tablet TAKE 1 & 1/2- 2 TABLETS BY MOUTH DAILY OR AS DIRECTED 60 tablet 6   No current facility-administered medications on file prior to visit.        ROS:  All others reviewed and negative.  Objective        PE:  BP (!) 150/82 (BP Location: Right Arm, Patient Position: Sitting, Cuff Size: Normal)    Pulse 87   Temp 98.7 F (37.1 C) (Oral)   Ht 5' 6.5" (1.689 m)   SpO2 98%   BMI 31.80 kg/m                 Constitutional: Pt appears in NAD               HENT: Head: NCAT.                Right Ear: External ear normal.                 Left Ear: External ear normal.                Eyes: . Pupils are equal, round, and reactive to light. Conjunctivae and EOM are normal  Nose: without d/c or deformity               Neck: Neck supple. Gross normal ROM               Cardiovascular: Normal rate and regular rhythm.                 Pulmonary/Chest: Effort normal and breath sounds without rales or wheezing.                Abd:  Soft, NT, ND, + BS, no organomegaly               Neurological: [name] alert. At baseline orientation, motor grossly intact               Skin: Skin is warm. No rashes, no other new lesions, LE edema - trace bilateral               Psychiatric: Pt behavior is normal without agitation   Micro: none  Cardiac tracings I have personally interpreted today:  none  Pertinent Radiological findings (summarize): none   Lab Results  Component Value Date   WBC 9.0 03/10/2020   HGB 15.1 03/10/2020   HCT 44.8 03/10/2020   PLT 155.0 03/10/2020   GLUCOSE 104 (H) 03/10/2020   CHOL 155 03/10/2020   TRIG 241.0 (H) 03/10/2020   HDL 39.50 03/10/2020   LDLDIRECT 80.0 03/10/2020   LDLCALC 110 (H) 04/03/2013   ALT 10 03/10/2020   AST 10 03/10/2020   NA 141 03/10/2020   K 4.0 03/10/2020   CL 106 03/10/2020   CREATININE 1.74 (H) 03/10/2020   BUN 23 03/10/2020   CO2 27 03/10/2020   TSH 2.14 03/10/2020   PSA 1.81 03/10/2020   INR 2.8 08/30/2020   HGBA1C 6.3 03/10/2020   Assessment/Plan:  Austin [name] is a 87 y.o. White or Caucasian [1] Black or African American [2] male with  has a past medical history of ADENOCARCINOMA, PROSTATE, GLEASON GRADE 5 (01/21/2009), ALLERGIC RHINITIS (01/14/2007), Bilateral inguinal hernia, Cholecystitis, Cholelithiasis, CKD  (chronic kidney disease) stage 3, GFR 30-59 ml/min (HCC) (04/30/2015), COLONIC POLYPS, HX OF (01/14/2007), ELEVATED PROSTATE SPECIFIC ANTIGEN (03/27/2008), ESOPHAGITIS (01/14/2007), GERD (gastroesophageal reflux disease), HYPERLIPIDEMIA (01/14/2007), HYPERTENSION (01/14/2007), LIBIDO, DECREASED (01/15/2007), PAF (paroxysmal atrial fibrillation) ([place]), and Paralysis ([place]).  Left spastic hemiparesis (HCC) Stable, to f/u neurology as planned  Hyperlipidemia Lab Results  Component Value Date   LDLCALC 110 (H) 04/03/2013   Uncontrolled, goal ldl < 70, pt to continue current statin crestor 10 and declines increase today   Gait disorder Ok for [facility] with PT  Essential hypertension BP Readings from Last 3 Encounters:  09/15/20 (!) 150/82  09/03/20 (!) 146/80  04/29/20 138/70   Uncontrolled, pt to continue medical treatment  - diet and low salt, and declines add antiHTN today, to f/u bp at home and next visit  CKD (chronic kidney disease) stage 3, GFR 30-59 ml/min (HCC) Lab Results  Component Value Date   CREATININE 1.74 (H) 03/10/2020   Stable overall, cont to avoid nephrotoxins   B12 deficiency Lab Results  Component Value Date   VITAMINB12 >1506 (H) 03/10/2020   Stable, cont oral replacement - b12 1000 mcg qd  Followup: Return in about 6 months (around 03/18/2021).  Cathlean Cower, MD 09/18/2020 10:04 PM [facility]

## 2020-09-15 NOTE — Patient Instructions (Signed)
Please watch for the new COVID shot coming probably October 2022  Please continue all other medications as before, and refills have been done if requested.  Please have the pharmacy call with any other refills you may need.  Please continue your efforts at being more active, low cholesterol diet, and weight control.  Please keep your appointments with your specialists as you may have planned - the kidney doctor with blood work there  You will be contacted regarding the referral for: Home Physical Therapy  Please make an Appointment to return in 6 months, or sooner if needed

## 2020-09-18 ENCOUNTER — Encounter: Payer: Self-pay | Admitting: Internal Medicine

## 2020-09-18 NOTE — Assessment & Plan Note (Signed)
Lab Results  Component Value Date   CREATININE 1.74 (H) 03/10/2020   Stable overall, cont to avoid nephrotoxins

## 2020-09-18 NOTE — Assessment & Plan Note (Signed)
BP Readings from Last 3 Encounters:  09/15/20 (!) 150/82  09/03/20 (!) 146/80  04/29/20 138/70   Uncontrolled, pt to continue medical treatment  - diet and low salt, and declines add antiHTN today, to f/u bp at home and next visit

## 2020-09-18 NOTE — Assessment & Plan Note (Signed)
Stable, to f/u neurology as planned

## 2020-09-18 NOTE — Assessment & Plan Note (Signed)
Lab Results  Component Value Date   VITAMINB12 >1506 (H) 03/10/2020   Stable, cont oral replacement - b12 1000 mcg qd

## 2020-09-18 NOTE — Assessment & Plan Note (Signed)
Lab Results  Component Value Date   LDLCALC 110 (H) 04/03/2013   Uncontrolled, goal ldl < 70, pt to continue current statin crestor 10 and declines increase today

## 2020-09-18 NOTE — Assessment & Plan Note (Signed)
[place] for [facility] with PT

## 2020-09-29 ENCOUNTER — Other Ambulatory Visit: Payer: Self-pay | Admitting: Internal Medicine

## 2020-09-29 NOTE — Telephone Encounter (Signed)
Please ask pt to ask for refill per coumadin clinic, thanks

## 2020-10-05 DIAGNOSIS — N184 Chronic kidney disease, stage 4 (severe): Secondary | ICD-10-CM | POA: Diagnosis not present

## 2020-10-06 DIAGNOSIS — Z8701 Personal history of pneumonia (recurrent): Secondary | ICD-10-CM | POA: Diagnosis not present

## 2020-10-06 DIAGNOSIS — Z9181 History of falling: Secondary | ICD-10-CM | POA: Diagnosis not present

## 2020-10-06 DIAGNOSIS — I872 Venous insufficiency (chronic) (peripheral): Secondary | ICD-10-CM | POA: Diagnosis not present

## 2020-10-06 DIAGNOSIS — H9191 Unspecified hearing loss, right ear: Secondary | ICD-10-CM | POA: Diagnosis not present

## 2020-10-06 DIAGNOSIS — M7502 Adhesive capsulitis of left shoulder: Secondary | ICD-10-CM | POA: Diagnosis not present

## 2020-10-06 DIAGNOSIS — Z7901 Long term (current) use of anticoagulants: Secondary | ICD-10-CM | POA: Diagnosis not present

## 2020-10-06 DIAGNOSIS — E782 Mixed hyperlipidemia: Secondary | ICD-10-CM | POA: Diagnosis not present

## 2020-10-06 DIAGNOSIS — R1314 Dysphagia, pharyngoesophageal phase: Secondary | ICD-10-CM | POA: Diagnosis not present

## 2020-10-06 DIAGNOSIS — I480 Paroxysmal atrial fibrillation: Secondary | ICD-10-CM | POA: Diagnosis not present

## 2020-10-06 DIAGNOSIS — K2100 Gastro-esophageal reflux disease with esophagitis, without bleeding: Secondary | ICD-10-CM | POA: Diagnosis not present

## 2020-10-06 DIAGNOSIS — D630 Anemia in neoplastic disease: Secondary | ICD-10-CM | POA: Diagnosis not present

## 2020-10-06 DIAGNOSIS — M1712 Unilateral primary osteoarthritis, left knee: Secondary | ICD-10-CM | POA: Diagnosis not present

## 2020-10-06 DIAGNOSIS — E538 Deficiency of other specified B group vitamins: Secondary | ICD-10-CM | POA: Diagnosis not present

## 2020-10-06 DIAGNOSIS — D631 Anemia in chronic kidney disease: Secondary | ICD-10-CM | POA: Diagnosis not present

## 2020-10-06 DIAGNOSIS — C61 Malignant neoplasm of prostate: Secondary | ICD-10-CM | POA: Diagnosis not present

## 2020-10-06 DIAGNOSIS — G8114 Spastic hemiplegia affecting left nondominant side: Secondary | ICD-10-CM | POA: Diagnosis not present

## 2020-10-06 DIAGNOSIS — I129 Hypertensive chronic kidney disease with stage 1 through stage 4 chronic kidney disease, or unspecified chronic kidney disease: Secondary | ICD-10-CM | POA: Diagnosis not present

## 2020-10-06 DIAGNOSIS — I700 Atherosclerosis of aorta: Secondary | ICD-10-CM | POA: Diagnosis not present

## 2020-10-06 DIAGNOSIS — N1831 Chronic kidney disease, stage 3a: Secondary | ICD-10-CM | POA: Diagnosis not present

## 2020-10-06 DIAGNOSIS — J45909 Unspecified asthma, uncomplicated: Secondary | ICD-10-CM | POA: Diagnosis not present

## 2020-10-06 DIAGNOSIS — M5410 Radiculopathy, site unspecified: Secondary | ICD-10-CM | POA: Diagnosis not present

## 2020-10-06 DIAGNOSIS — H539 Unspecified visual disturbance: Secondary | ICD-10-CM | POA: Diagnosis not present

## 2020-10-06 DIAGNOSIS — Z87891 Personal history of nicotine dependence: Secondary | ICD-10-CM | POA: Diagnosis not present

## 2020-10-06 DIAGNOSIS — S069X0S Unspecified intracranial injury without loss of consciousness, sequela: Secondary | ICD-10-CM | POA: Diagnosis not present

## 2020-10-11 ENCOUNTER — Ambulatory Visit (INDEPENDENT_AMBULATORY_CARE_PROVIDER_SITE_OTHER): Payer: Medicare Other | Admitting: *Deleted

## 2020-10-11 ENCOUNTER — Other Ambulatory Visit: Payer: Self-pay

## 2020-10-11 DIAGNOSIS — I480 Paroxysmal atrial fibrillation: Secondary | ICD-10-CM | POA: Diagnosis not present

## 2020-10-11 DIAGNOSIS — I129 Hypertensive chronic kidney disease with stage 1 through stage 4 chronic kidney disease, or unspecified chronic kidney disease: Secondary | ICD-10-CM | POA: Diagnosis not present

## 2020-10-11 DIAGNOSIS — Z5181 Encounter for therapeutic drug level monitoring: Secondary | ICD-10-CM

## 2020-10-11 DIAGNOSIS — D631 Anemia in chronic kidney disease: Secondary | ICD-10-CM | POA: Diagnosis not present

## 2020-10-11 DIAGNOSIS — N1831 Chronic kidney disease, stage 3a: Secondary | ICD-10-CM | POA: Diagnosis not present

## 2020-10-11 DIAGNOSIS — I4891 Unspecified atrial fibrillation: Secondary | ICD-10-CM | POA: Diagnosis not present

## 2020-10-11 DIAGNOSIS — G8114 Spastic hemiplegia affecting left nondominant side: Secondary | ICD-10-CM | POA: Diagnosis not present

## 2020-10-11 DIAGNOSIS — S069X0S Unspecified intracranial injury without loss of consciousness, sequela: Secondary | ICD-10-CM | POA: Diagnosis not present

## 2020-10-11 LAB — POCT INR: INR: 2.3 (ref 2.0–3.0)

## 2020-10-11 NOTE — Patient Instructions (Signed)
Continue warfarin 1 1/2 tablets daily  Recheck in 6 weeks in [place] office.

## 2020-10-12 DIAGNOSIS — I129 Hypertensive chronic kidney disease with stage 1 through stage 4 chronic kidney disease, or unspecified chronic kidney disease: Secondary | ICD-10-CM | POA: Diagnosis not present

## 2020-10-12 DIAGNOSIS — N2581 Secondary hyperparathyroidism of renal origin: Secondary | ICD-10-CM | POA: Diagnosis not present

## 2020-10-12 DIAGNOSIS — N1832 Chronic kidney disease, stage 3b: Secondary | ICD-10-CM | POA: Diagnosis not present

## 2020-10-12 DIAGNOSIS — D631 Anemia in chronic kidney disease: Secondary | ICD-10-CM | POA: Diagnosis not present

## 2020-10-12 DIAGNOSIS — G839 Paralytic syndrome, unspecified: Secondary | ICD-10-CM | POA: Diagnosis not present

## 2020-10-14 DIAGNOSIS — I480 Paroxysmal atrial fibrillation: Secondary | ICD-10-CM | POA: Diagnosis not present

## 2020-10-14 DIAGNOSIS — I129 Hypertensive chronic kidney disease with stage 1 through stage 4 chronic kidney disease, or unspecified chronic kidney disease: Secondary | ICD-10-CM | POA: Diagnosis not present

## 2020-10-14 DIAGNOSIS — N1831 Chronic kidney disease, stage 3a: Secondary | ICD-10-CM | POA: Diagnosis not present

## 2020-10-14 DIAGNOSIS — S069X0S Unspecified intracranial injury without loss of consciousness, sequela: Secondary | ICD-10-CM | POA: Diagnosis not present

## 2020-10-14 DIAGNOSIS — G8114 Spastic hemiplegia affecting left nondominant side: Secondary | ICD-10-CM | POA: Diagnosis not present

## 2020-10-14 DIAGNOSIS — D631 Anemia in chronic kidney disease: Secondary | ICD-10-CM | POA: Diagnosis not present

## 2020-10-18 DIAGNOSIS — I480 Paroxysmal atrial fibrillation: Secondary | ICD-10-CM | POA: Diagnosis not present

## 2020-10-18 DIAGNOSIS — G8114 Spastic hemiplegia affecting left nondominant side: Secondary | ICD-10-CM | POA: Diagnosis not present

## 2020-10-18 DIAGNOSIS — S069X0S Unspecified intracranial injury without loss of consciousness, sequela: Secondary | ICD-10-CM | POA: Diagnosis not present

## 2020-10-18 DIAGNOSIS — D631 Anemia in chronic kidney disease: Secondary | ICD-10-CM | POA: Diagnosis not present

## 2020-10-18 DIAGNOSIS — I129 Hypertensive chronic kidney disease with stage 1 through stage 4 chronic kidney disease, or unspecified chronic kidney disease: Secondary | ICD-10-CM | POA: Diagnosis not present

## 2020-10-18 DIAGNOSIS — N1831 Chronic kidney disease, stage 3a: Secondary | ICD-10-CM | POA: Diagnosis not present

## 2020-10-20 DIAGNOSIS — N1831 Chronic kidney disease, stage 3a: Secondary | ICD-10-CM | POA: Diagnosis not present

## 2020-10-20 DIAGNOSIS — D631 Anemia in chronic kidney disease: Secondary | ICD-10-CM | POA: Diagnosis not present

## 2020-10-20 DIAGNOSIS — I129 Hypertensive chronic kidney disease with stage 1 through stage 4 chronic kidney disease, or unspecified chronic kidney disease: Secondary | ICD-10-CM | POA: Diagnosis not present

## 2020-10-20 DIAGNOSIS — I480 Paroxysmal atrial fibrillation: Secondary | ICD-10-CM | POA: Diagnosis not present

## 2020-10-20 DIAGNOSIS — S069X0S Unspecified intracranial injury without loss of consciousness, sequela: Secondary | ICD-10-CM | POA: Diagnosis not present

## 2020-10-20 DIAGNOSIS — G8114 Spastic hemiplegia affecting left nondominant side: Secondary | ICD-10-CM | POA: Diagnosis not present

## 2020-10-21 DIAGNOSIS — D631 Anemia in chronic kidney disease: Secondary | ICD-10-CM | POA: Diagnosis not present

## 2020-10-21 DIAGNOSIS — N1831 Chronic kidney disease, stage 3a: Secondary | ICD-10-CM | POA: Diagnosis not present

## 2020-10-21 DIAGNOSIS — S069X0S Unspecified intracranial injury without loss of consciousness, sequela: Secondary | ICD-10-CM | POA: Diagnosis not present

## 2020-10-21 DIAGNOSIS — I480 Paroxysmal atrial fibrillation: Secondary | ICD-10-CM | POA: Diagnosis not present

## 2020-10-21 DIAGNOSIS — I129 Hypertensive chronic kidney disease with stage 1 through stage 4 chronic kidney disease, or unspecified chronic kidney disease: Secondary | ICD-10-CM | POA: Diagnosis not present

## 2020-10-21 DIAGNOSIS — G8114 Spastic hemiplegia affecting left nondominant side: Secondary | ICD-10-CM | POA: Diagnosis not present

## 2020-10-25 DIAGNOSIS — D631 Anemia in chronic kidney disease: Secondary | ICD-10-CM | POA: Diagnosis not present

## 2020-10-25 DIAGNOSIS — G8114 Spastic hemiplegia affecting left nondominant side: Secondary | ICD-10-CM | POA: Diagnosis not present

## 2020-10-25 DIAGNOSIS — I129 Hypertensive chronic kidney disease with stage 1 through stage 4 chronic kidney disease, or unspecified chronic kidney disease: Secondary | ICD-10-CM | POA: Diagnosis not present

## 2020-10-25 DIAGNOSIS — N1831 Chronic kidney disease, stage 3a: Secondary | ICD-10-CM | POA: Diagnosis not present

## 2020-10-25 DIAGNOSIS — S069X0S Unspecified intracranial injury without loss of consciousness, sequela: Secondary | ICD-10-CM | POA: Diagnosis not present

## 2020-10-25 DIAGNOSIS — I480 Paroxysmal atrial fibrillation: Secondary | ICD-10-CM | POA: Diagnosis not present

## 2020-10-26 DIAGNOSIS — D631 Anemia in chronic kidney disease: Secondary | ICD-10-CM | POA: Diagnosis not present

## 2020-10-26 DIAGNOSIS — S069X0S Unspecified intracranial injury without loss of consciousness, sequela: Secondary | ICD-10-CM | POA: Diagnosis not present

## 2020-10-26 DIAGNOSIS — I129 Hypertensive chronic kidney disease with stage 1 through stage 4 chronic kidney disease, or unspecified chronic kidney disease: Secondary | ICD-10-CM | POA: Diagnosis not present

## 2020-10-26 DIAGNOSIS — G8114 Spastic hemiplegia affecting left nondominant side: Secondary | ICD-10-CM | POA: Diagnosis not present

## 2020-10-26 DIAGNOSIS — N1831 Chronic kidney disease, stage 3a: Secondary | ICD-10-CM | POA: Diagnosis not present

## 2020-10-26 DIAGNOSIS — I480 Paroxysmal atrial fibrillation: Secondary | ICD-10-CM | POA: Diagnosis not present

## 2020-10-28 DIAGNOSIS — S069X0S Unspecified intracranial injury without loss of consciousness, sequela: Secondary | ICD-10-CM | POA: Diagnosis not present

## 2020-10-28 DIAGNOSIS — G8114 Spastic hemiplegia affecting left nondominant side: Secondary | ICD-10-CM | POA: Diagnosis not present

## 2020-10-28 DIAGNOSIS — D631 Anemia in chronic kidney disease: Secondary | ICD-10-CM | POA: Diagnosis not present

## 2020-10-28 DIAGNOSIS — I129 Hypertensive chronic kidney disease with stage 1 through stage 4 chronic kidney disease, or unspecified chronic kidney disease: Secondary | ICD-10-CM | POA: Diagnosis not present

## 2020-10-28 DIAGNOSIS — I480 Paroxysmal atrial fibrillation: Secondary | ICD-10-CM | POA: Diagnosis not present

## 2020-10-28 DIAGNOSIS — N1831 Chronic kidney disease, stage 3a: Secondary | ICD-10-CM | POA: Diagnosis not present

## 2020-10-29 DIAGNOSIS — B351 Tinea unguium: Secondary | ICD-10-CM | POA: Diagnosis not present

## 2020-10-29 DIAGNOSIS — L84 Corns and callosities: Secondary | ICD-10-CM | POA: Diagnosis not present

## 2020-10-29 DIAGNOSIS — I739 Peripheral vascular disease, unspecified: Secondary | ICD-10-CM | POA: Diagnosis not present

## 2020-10-29 DIAGNOSIS — G8190 Hemiplegia, unspecified affecting unspecified side: Secondary | ICD-10-CM | POA: Diagnosis not present

## 2020-11-01 DIAGNOSIS — N1831 Chronic kidney disease, stage 3a: Secondary | ICD-10-CM | POA: Diagnosis not present

## 2020-11-01 DIAGNOSIS — D631 Anemia in chronic kidney disease: Secondary | ICD-10-CM | POA: Diagnosis not present

## 2020-11-01 DIAGNOSIS — L308 Other specified dermatitis: Secondary | ICD-10-CM | POA: Diagnosis not present

## 2020-11-01 DIAGNOSIS — G8114 Spastic hemiplegia affecting left nondominant side: Secondary | ICD-10-CM | POA: Diagnosis not present

## 2020-11-01 DIAGNOSIS — I129 Hypertensive chronic kidney disease with stage 1 through stage 4 chronic kidney disease, or unspecified chronic kidney disease: Secondary | ICD-10-CM | POA: Diagnosis not present

## 2020-11-01 DIAGNOSIS — I480 Paroxysmal atrial fibrillation: Secondary | ICD-10-CM | POA: Diagnosis not present

## 2020-11-01 DIAGNOSIS — S069X0S Unspecified intracranial injury without loss of consciousness, sequela: Secondary | ICD-10-CM | POA: Diagnosis not present

## 2020-11-03 ENCOUNTER — Other Ambulatory Visit: Payer: Self-pay | Admitting: Internal Medicine

## 2020-11-03 DIAGNOSIS — I129 Hypertensive chronic kidney disease with stage 1 through stage 4 chronic kidney disease, or unspecified chronic kidney disease: Secondary | ICD-10-CM | POA: Diagnosis not present

## 2020-11-03 DIAGNOSIS — I480 Paroxysmal atrial fibrillation: Secondary | ICD-10-CM | POA: Diagnosis not present

## 2020-11-03 DIAGNOSIS — G8114 Spastic hemiplegia affecting left nondominant side: Secondary | ICD-10-CM | POA: Diagnosis not present

## 2020-11-03 DIAGNOSIS — S069X0S Unspecified intracranial injury without loss of consciousness, sequela: Secondary | ICD-10-CM | POA: Diagnosis not present

## 2020-11-03 DIAGNOSIS — N1831 Chronic kidney disease, stage 3a: Secondary | ICD-10-CM | POA: Diagnosis not present

## 2020-11-03 DIAGNOSIS — D631 Anemia in chronic kidney disease: Secondary | ICD-10-CM | POA: Diagnosis not present

## 2020-11-03 NOTE — Telephone Encounter (Signed)
Please refill as per office routine med refill policy (all routine meds to be refilled for 3 mo or monthly (per pt preference) up to one year from last visit, then month to month grace period for 3 mo, then further med refills will have to be denied) ? ?

## 2020-11-04 DIAGNOSIS — I480 Paroxysmal atrial fibrillation: Secondary | ICD-10-CM | POA: Diagnosis not present

## 2020-11-04 DIAGNOSIS — D631 Anemia in chronic kidney disease: Secondary | ICD-10-CM | POA: Diagnosis not present

## 2020-11-04 DIAGNOSIS — G8114 Spastic hemiplegia affecting left nondominant side: Secondary | ICD-10-CM | POA: Diagnosis not present

## 2020-11-04 DIAGNOSIS — S069X0S Unspecified intracranial injury without loss of consciousness, sequela: Secondary | ICD-10-CM | POA: Diagnosis not present

## 2020-11-04 DIAGNOSIS — N1831 Chronic kidney disease, stage 3a: Secondary | ICD-10-CM | POA: Diagnosis not present

## 2020-11-04 DIAGNOSIS — I129 Hypertensive chronic kidney disease with stage 1 through stage 4 chronic kidney disease, or unspecified chronic kidney disease: Secondary | ICD-10-CM | POA: Diagnosis not present

## 2020-11-05 DIAGNOSIS — M7502 Adhesive capsulitis of left shoulder: Secondary | ICD-10-CM | POA: Diagnosis not present

## 2020-11-05 DIAGNOSIS — H539 Unspecified visual disturbance: Secondary | ICD-10-CM | POA: Diagnosis not present

## 2020-11-05 DIAGNOSIS — C61 Malignant neoplasm of prostate: Secondary | ICD-10-CM | POA: Diagnosis not present

## 2020-11-05 DIAGNOSIS — N1831 Chronic kidney disease, stage 3a: Secondary | ICD-10-CM | POA: Diagnosis not present

## 2020-11-05 DIAGNOSIS — M5410 Radiculopathy, site unspecified: Secondary | ICD-10-CM | POA: Diagnosis not present

## 2020-11-05 DIAGNOSIS — K2100 Gastro-esophageal reflux disease with esophagitis, without bleeding: Secondary | ICD-10-CM | POA: Diagnosis not present

## 2020-11-05 DIAGNOSIS — J45909 Unspecified asthma, uncomplicated: Secondary | ICD-10-CM | POA: Diagnosis not present

## 2020-11-05 DIAGNOSIS — Z9181 History of falling: Secondary | ICD-10-CM | POA: Diagnosis not present

## 2020-11-05 DIAGNOSIS — M1712 Unilateral primary osteoarthritis, left knee: Secondary | ICD-10-CM | POA: Diagnosis not present

## 2020-11-05 DIAGNOSIS — D631 Anemia in chronic kidney disease: Secondary | ICD-10-CM | POA: Diagnosis not present

## 2020-11-05 DIAGNOSIS — R1314 Dysphagia, pharyngoesophageal phase: Secondary | ICD-10-CM | POA: Diagnosis not present

## 2020-11-05 DIAGNOSIS — S069X0S Unspecified intracranial injury without loss of consciousness, sequela: Secondary | ICD-10-CM | POA: Diagnosis not present

## 2020-11-05 DIAGNOSIS — Z87891 Personal history of nicotine dependence: Secondary | ICD-10-CM | POA: Diagnosis not present

## 2020-11-05 DIAGNOSIS — I700 Atherosclerosis of aorta: Secondary | ICD-10-CM | POA: Diagnosis not present

## 2020-11-05 DIAGNOSIS — Z8701 Personal history of pneumonia (recurrent): Secondary | ICD-10-CM | POA: Diagnosis not present

## 2020-11-05 DIAGNOSIS — H9191 Unspecified hearing loss, right ear: Secondary | ICD-10-CM | POA: Diagnosis not present

## 2020-11-05 DIAGNOSIS — E782 Mixed hyperlipidemia: Secondary | ICD-10-CM | POA: Diagnosis not present

## 2020-11-05 DIAGNOSIS — Z7901 Long term (current) use of anticoagulants: Secondary | ICD-10-CM | POA: Diagnosis not present

## 2020-11-05 DIAGNOSIS — G8114 Spastic hemiplegia affecting left nondominant side: Secondary | ICD-10-CM | POA: Diagnosis not present

## 2020-11-05 DIAGNOSIS — I129 Hypertensive chronic kidney disease with stage 1 through stage 4 chronic kidney disease, or unspecified chronic kidney disease: Secondary | ICD-10-CM | POA: Diagnosis not present

## 2020-11-05 DIAGNOSIS — I480 Paroxysmal atrial fibrillation: Secondary | ICD-10-CM | POA: Diagnosis not present

## 2020-11-05 DIAGNOSIS — E538 Deficiency of other specified B group vitamins: Secondary | ICD-10-CM | POA: Diagnosis not present

## 2020-11-05 DIAGNOSIS — D630 Anemia in neoplastic disease: Secondary | ICD-10-CM | POA: Diagnosis not present

## 2020-11-05 DIAGNOSIS — I872 Venous insufficiency (chronic) (peripheral): Secondary | ICD-10-CM | POA: Diagnosis not present

## 2020-11-08 DIAGNOSIS — N1831 Chronic kidney disease, stage 3a: Secondary | ICD-10-CM | POA: Diagnosis not present

## 2020-11-08 DIAGNOSIS — S069X0S Unspecified intracranial injury without loss of consciousness, sequela: Secondary | ICD-10-CM | POA: Diagnosis not present

## 2020-11-08 DIAGNOSIS — I480 Paroxysmal atrial fibrillation: Secondary | ICD-10-CM | POA: Diagnosis not present

## 2020-11-08 DIAGNOSIS — I129 Hypertensive chronic kidney disease with stage 1 through stage 4 chronic kidney disease, or unspecified chronic kidney disease: Secondary | ICD-10-CM | POA: Diagnosis not present

## 2020-11-08 DIAGNOSIS — G8114 Spastic hemiplegia affecting left nondominant side: Secondary | ICD-10-CM | POA: Diagnosis not present

## 2020-11-08 DIAGNOSIS — D631 Anemia in chronic kidney disease: Secondary | ICD-10-CM | POA: Diagnosis not present

## 2020-11-09 DIAGNOSIS — S069X0S Unspecified intracranial injury without loss of consciousness, sequela: Secondary | ICD-10-CM | POA: Diagnosis not present

## 2020-11-09 DIAGNOSIS — G8114 Spastic hemiplegia affecting left nondominant side: Secondary | ICD-10-CM | POA: Diagnosis not present

## 2020-11-09 DIAGNOSIS — N1831 Chronic kidney disease, stage 3a: Secondary | ICD-10-CM | POA: Diagnosis not present

## 2020-11-09 DIAGNOSIS — I129 Hypertensive chronic kidney disease with stage 1 through stage 4 chronic kidney disease, or unspecified chronic kidney disease: Secondary | ICD-10-CM | POA: Diagnosis not present

## 2020-11-09 DIAGNOSIS — I480 Paroxysmal atrial fibrillation: Secondary | ICD-10-CM | POA: Diagnosis not present

## 2020-11-09 DIAGNOSIS — D631 Anemia in chronic kidney disease: Secondary | ICD-10-CM | POA: Diagnosis not present

## 2020-11-10 DIAGNOSIS — D631 Anemia in chronic kidney disease: Secondary | ICD-10-CM | POA: Diagnosis not present

## 2020-11-10 DIAGNOSIS — G8114 Spastic hemiplegia affecting left nondominant side: Secondary | ICD-10-CM | POA: Diagnosis not present

## 2020-11-10 DIAGNOSIS — I129 Hypertensive chronic kidney disease with stage 1 through stage 4 chronic kidney disease, or unspecified chronic kidney disease: Secondary | ICD-10-CM | POA: Diagnosis not present

## 2020-11-10 DIAGNOSIS — I480 Paroxysmal atrial fibrillation: Secondary | ICD-10-CM | POA: Diagnosis not present

## 2020-11-10 DIAGNOSIS — S069X0S Unspecified intracranial injury without loss of consciousness, sequela: Secondary | ICD-10-CM | POA: Diagnosis not present

## 2020-11-10 DIAGNOSIS — N1831 Chronic kidney disease, stage 3a: Secondary | ICD-10-CM | POA: Diagnosis not present

## 2020-11-12 DIAGNOSIS — G8114 Spastic hemiplegia affecting left nondominant side: Secondary | ICD-10-CM | POA: Diagnosis not present

## 2020-11-12 DIAGNOSIS — I480 Paroxysmal atrial fibrillation: Secondary | ICD-10-CM | POA: Diagnosis not present

## 2020-11-12 DIAGNOSIS — N1831 Chronic kidney disease, stage 3a: Secondary | ICD-10-CM | POA: Diagnosis not present

## 2020-11-12 DIAGNOSIS — S069X0S Unspecified intracranial injury without loss of consciousness, sequela: Secondary | ICD-10-CM | POA: Diagnosis not present

## 2020-11-12 DIAGNOSIS — D631 Anemia in chronic kidney disease: Secondary | ICD-10-CM | POA: Diagnosis not present

## 2020-11-12 DIAGNOSIS — I129 Hypertensive chronic kidney disease with stage 1 through stage 4 chronic kidney disease, or unspecified chronic kidney disease: Secondary | ICD-10-CM | POA: Diagnosis not present

## 2020-11-15 DIAGNOSIS — I129 Hypertensive chronic kidney disease with stage 1 through stage 4 chronic kidney disease, or unspecified chronic kidney disease: Secondary | ICD-10-CM | POA: Diagnosis not present

## 2020-11-15 DIAGNOSIS — D631 Anemia in chronic kidney disease: Secondary | ICD-10-CM | POA: Diagnosis not present

## 2020-11-15 DIAGNOSIS — N1831 Chronic kidney disease, stage 3a: Secondary | ICD-10-CM | POA: Diagnosis not present

## 2020-11-15 DIAGNOSIS — I480 Paroxysmal atrial fibrillation: Secondary | ICD-10-CM | POA: Diagnosis not present

## 2020-11-15 DIAGNOSIS — G8114 Spastic hemiplegia affecting left nondominant side: Secondary | ICD-10-CM | POA: Diagnosis not present

## 2020-11-15 DIAGNOSIS — S069X0S Unspecified intracranial injury without loss of consciousness, sequela: Secondary | ICD-10-CM | POA: Diagnosis not present

## 2020-11-16 DIAGNOSIS — D631 Anemia in chronic kidney disease: Secondary | ICD-10-CM | POA: Diagnosis not present

## 2020-11-16 DIAGNOSIS — G8114 Spastic hemiplegia affecting left nondominant side: Secondary | ICD-10-CM | POA: Diagnosis not present

## 2020-11-16 DIAGNOSIS — I129 Hypertensive chronic kidney disease with stage 1 through stage 4 chronic kidney disease, or unspecified chronic kidney disease: Secondary | ICD-10-CM | POA: Diagnosis not present

## 2020-11-16 DIAGNOSIS — N1831 Chronic kidney disease, stage 3a: Secondary | ICD-10-CM | POA: Diagnosis not present

## 2020-11-16 DIAGNOSIS — S069X0S Unspecified intracranial injury without loss of consciousness, sequela: Secondary | ICD-10-CM | POA: Diagnosis not present

## 2020-11-16 DIAGNOSIS — I480 Paroxysmal atrial fibrillation: Secondary | ICD-10-CM | POA: Diagnosis not present

## 2020-11-17 DIAGNOSIS — I480 Paroxysmal atrial fibrillation: Secondary | ICD-10-CM | POA: Diagnosis not present

## 2020-11-17 DIAGNOSIS — N1831 Chronic kidney disease, stage 3a: Secondary | ICD-10-CM | POA: Diagnosis not present

## 2020-11-17 DIAGNOSIS — G8114 Spastic hemiplegia affecting left nondominant side: Secondary | ICD-10-CM | POA: Diagnosis not present

## 2020-11-17 DIAGNOSIS — S069X0S Unspecified intracranial injury without loss of consciousness, sequela: Secondary | ICD-10-CM | POA: Diagnosis not present

## 2020-11-17 DIAGNOSIS — D631 Anemia in chronic kidney disease: Secondary | ICD-10-CM | POA: Diagnosis not present

## 2020-11-17 DIAGNOSIS — I129 Hypertensive chronic kidney disease with stage 1 through stage 4 chronic kidney disease, or unspecified chronic kidney disease: Secondary | ICD-10-CM | POA: Diagnosis not present

## 2020-11-23 ENCOUNTER — Telehealth: Payer: Self-pay | Admitting: Internal Medicine

## 2020-11-23 NOTE — Telephone Encounter (Signed)
Herbie Baltimore from [employer] in  [place] says he faxed a Physician Confirmation of Order form on 10.26.22  Says he received a fax back but it was only the originally cover letter that was sent to [place].. the form was not attached  Would like to have form faxed back as soon as possible   Callback # [phone]

## 2020-11-23 NOTE — Telephone Encounter (Signed)
[facility] calling to check status on fax

## 2020-11-23 NOTE — Telephone Encounter (Signed)
Home Health verbal orders-caller/Agency: Michelle/ Santina Evans number: [phone]  Requesting OT/PT/Skilled nursing/Social Work/Speech: OT  Frequency: 2w2 1w2  Therapist requesting ot recert

## 2020-11-24 ENCOUNTER — Ambulatory Visit (INDEPENDENT_AMBULATORY_CARE_PROVIDER_SITE_OTHER): Payer: Medicare Other | Admitting: *Deleted

## 2020-11-24 DIAGNOSIS — I129 Hypertensive chronic kidney disease with stage 1 through stage 4 chronic kidney disease, or unspecified chronic kidney disease: Secondary | ICD-10-CM | POA: Diagnosis not present

## 2020-11-24 DIAGNOSIS — I4891 Unspecified atrial fibrillation: Secondary | ICD-10-CM | POA: Diagnosis not present

## 2020-11-24 DIAGNOSIS — G8114 Spastic hemiplegia affecting left nondominant side: Secondary | ICD-10-CM | POA: Diagnosis not present

## 2020-11-24 DIAGNOSIS — N1831 Chronic kidney disease, stage 3a: Secondary | ICD-10-CM | POA: Diagnosis not present

## 2020-11-24 DIAGNOSIS — Z5181 Encounter for therapeutic drug level monitoring: Secondary | ICD-10-CM | POA: Diagnosis not present

## 2020-11-24 DIAGNOSIS — D631 Anemia in chronic kidney disease: Secondary | ICD-10-CM | POA: Diagnosis not present

## 2020-11-24 DIAGNOSIS — S069X0S Unspecified intracranial injury without loss of consciousness, sequela: Secondary | ICD-10-CM | POA: Diagnosis not present

## 2020-11-24 DIAGNOSIS — I480 Paroxysmal atrial fibrillation: Secondary | ICD-10-CM | POA: Diagnosis not present

## 2020-11-24 LAB — POCT INR: INR: 2.5 (ref 2.0–3.0)

## 2020-11-24 NOTE — Telephone Encounter (Signed)
Ok for verbals 

## 2020-11-24 NOTE — Patient Instructions (Signed)
Continue warfarin 1 1/2 tablets daily  Recheck in 6 weeks in [place] office.

## 2020-11-24 NOTE — Telephone Encounter (Signed)
Form re-faxed

## 2020-11-24 NOTE — Telephone Encounter (Signed)
Verbals given  

## 2020-11-26 DIAGNOSIS — H43813 Vitreous degeneration, bilateral: Secondary | ICD-10-CM | POA: Diagnosis not present

## 2020-11-26 DIAGNOSIS — H524 Presbyopia: Secondary | ICD-10-CM | POA: Diagnosis not present

## 2020-11-26 DIAGNOSIS — H4720 Unspecified optic atrophy: Secondary | ICD-10-CM | POA: Diagnosis not present

## 2020-11-26 DIAGNOSIS — H40013 Open angle with borderline findings, low risk, bilateral: Secondary | ICD-10-CM | POA: Diagnosis not present

## 2020-11-30 DIAGNOSIS — Z23 Encounter for immunization: Secondary | ICD-10-CM | POA: Diagnosis not present

## 2020-12-02 DIAGNOSIS — G8114 Spastic hemiplegia affecting left nondominant side: Secondary | ICD-10-CM | POA: Diagnosis not present

## 2020-12-02 DIAGNOSIS — I480 Paroxysmal atrial fibrillation: Secondary | ICD-10-CM | POA: Diagnosis not present

## 2020-12-02 DIAGNOSIS — S069X0S Unspecified intracranial injury without loss of consciousness, sequela: Secondary | ICD-10-CM | POA: Diagnosis not present

## 2020-12-02 DIAGNOSIS — D631 Anemia in chronic kidney disease: Secondary | ICD-10-CM | POA: Diagnosis not present

## 2020-12-02 DIAGNOSIS — N1831 Chronic kidney disease, stage 3a: Secondary | ICD-10-CM | POA: Diagnosis not present

## 2020-12-02 DIAGNOSIS — I129 Hypertensive chronic kidney disease with stage 1 through stage 4 chronic kidney disease, or unspecified chronic kidney disease: Secondary | ICD-10-CM | POA: Diagnosis not present

## 2020-12-05 DIAGNOSIS — G8114 Spastic hemiplegia affecting left nondominant side: Secondary | ICD-10-CM | POA: Diagnosis not present

## 2020-12-05 DIAGNOSIS — I129 Hypertensive chronic kidney disease with stage 1 through stage 4 chronic kidney disease, or unspecified chronic kidney disease: Secondary | ICD-10-CM | POA: Diagnosis not present

## 2020-12-05 DIAGNOSIS — H539 Unspecified visual disturbance: Secondary | ICD-10-CM | POA: Diagnosis not present

## 2020-12-05 DIAGNOSIS — D630 Anemia in neoplastic disease: Secondary | ICD-10-CM | POA: Diagnosis not present

## 2020-12-05 DIAGNOSIS — R1314 Dysphagia, pharyngoesophageal phase: Secondary | ICD-10-CM | POA: Diagnosis not present

## 2020-12-05 DIAGNOSIS — H9191 Unspecified hearing loss, right ear: Secondary | ICD-10-CM | POA: Diagnosis not present

## 2020-12-05 DIAGNOSIS — I700 Atherosclerosis of aorta: Secondary | ICD-10-CM | POA: Diagnosis not present

## 2020-12-05 DIAGNOSIS — Z8701 Personal history of pneumonia (recurrent): Secondary | ICD-10-CM | POA: Diagnosis not present

## 2020-12-05 DIAGNOSIS — N1831 Chronic kidney disease, stage 3a: Secondary | ICD-10-CM | POA: Diagnosis not present

## 2020-12-05 DIAGNOSIS — I480 Paroxysmal atrial fibrillation: Secondary | ICD-10-CM | POA: Diagnosis not present

## 2020-12-05 DIAGNOSIS — Z7901 Long term (current) use of anticoagulants: Secondary | ICD-10-CM | POA: Diagnosis not present

## 2020-12-05 DIAGNOSIS — C61 Malignant neoplasm of prostate: Secondary | ICD-10-CM | POA: Diagnosis not present

## 2020-12-05 DIAGNOSIS — E538 Deficiency of other specified B group vitamins: Secondary | ICD-10-CM | POA: Diagnosis not present

## 2020-12-05 DIAGNOSIS — K2100 Gastro-esophageal reflux disease with esophagitis, without bleeding: Secondary | ICD-10-CM | POA: Diagnosis not present

## 2020-12-05 DIAGNOSIS — M7502 Adhesive capsulitis of left shoulder: Secondary | ICD-10-CM | POA: Diagnosis not present

## 2020-12-05 DIAGNOSIS — I872 Venous insufficiency (chronic) (peripheral): Secondary | ICD-10-CM | POA: Diagnosis not present

## 2020-12-05 DIAGNOSIS — J45909 Unspecified asthma, uncomplicated: Secondary | ICD-10-CM | POA: Diagnosis not present

## 2020-12-05 DIAGNOSIS — Z87891 Personal history of nicotine dependence: Secondary | ICD-10-CM | POA: Diagnosis not present

## 2020-12-05 DIAGNOSIS — D631 Anemia in chronic kidney disease: Secondary | ICD-10-CM | POA: Diagnosis not present

## 2020-12-05 DIAGNOSIS — M5410 Radiculopathy, site unspecified: Secondary | ICD-10-CM | POA: Diagnosis not present

## 2020-12-05 DIAGNOSIS — M1712 Unilateral primary osteoarthritis, left knee: Secondary | ICD-10-CM | POA: Diagnosis not present

## 2020-12-05 DIAGNOSIS — M62442 Contracture of muscle, left hand: Secondary | ICD-10-CM | POA: Diagnosis not present

## 2020-12-05 DIAGNOSIS — M62412 Contracture of muscle, left shoulder: Secondary | ICD-10-CM | POA: Diagnosis not present

## 2020-12-05 DIAGNOSIS — S069X0S Unspecified intracranial injury without loss of consciousness, sequela: Secondary | ICD-10-CM | POA: Diagnosis not present

## 2020-12-05 DIAGNOSIS — E782 Mixed hyperlipidemia: Secondary | ICD-10-CM | POA: Diagnosis not present

## 2020-12-06 DIAGNOSIS — Z23 Encounter for immunization: Secondary | ICD-10-CM | POA: Diagnosis not present

## 2020-12-08 DIAGNOSIS — M62442 Contracture of muscle, left hand: Secondary | ICD-10-CM | POA: Diagnosis not present

## 2020-12-08 DIAGNOSIS — S069X0S Unspecified intracranial injury without loss of consciousness, sequela: Secondary | ICD-10-CM | POA: Diagnosis not present

## 2020-12-08 DIAGNOSIS — I129 Hypertensive chronic kidney disease with stage 1 through stage 4 chronic kidney disease, or unspecified chronic kidney disease: Secondary | ICD-10-CM | POA: Diagnosis not present

## 2020-12-08 DIAGNOSIS — M62412 Contracture of muscle, left shoulder: Secondary | ICD-10-CM | POA: Diagnosis not present

## 2020-12-08 DIAGNOSIS — G8114 Spastic hemiplegia affecting left nondominant side: Secondary | ICD-10-CM | POA: Diagnosis not present

## 2020-12-08 DIAGNOSIS — N1831 Chronic kidney disease, stage 3a: Secondary | ICD-10-CM | POA: Diagnosis not present

## 2020-12-13 DIAGNOSIS — S069X0S Unspecified intracranial injury without loss of consciousness, sequela: Secondary | ICD-10-CM | POA: Diagnosis not present

## 2020-12-13 DIAGNOSIS — I129 Hypertensive chronic kidney disease with stage 1 through stage 4 chronic kidney disease, or unspecified chronic kidney disease: Secondary | ICD-10-CM | POA: Diagnosis not present

## 2020-12-13 DIAGNOSIS — M62412 Contracture of muscle, left shoulder: Secondary | ICD-10-CM | POA: Diagnosis not present

## 2020-12-13 DIAGNOSIS — M62442 Contracture of muscle, left hand: Secondary | ICD-10-CM | POA: Diagnosis not present

## 2020-12-13 DIAGNOSIS — G8114 Spastic hemiplegia affecting left nondominant side: Secondary | ICD-10-CM | POA: Diagnosis not present

## 2020-12-13 DIAGNOSIS — N1831 Chronic kidney disease, stage 3a: Secondary | ICD-10-CM | POA: Diagnosis not present

## 2020-12-15 DIAGNOSIS — S069X0S Unspecified intracranial injury without loss of consciousness, sequela: Secondary | ICD-10-CM | POA: Diagnosis not present

## 2020-12-15 DIAGNOSIS — M62412 Contracture of muscle, left shoulder: Secondary | ICD-10-CM | POA: Diagnosis not present

## 2020-12-15 DIAGNOSIS — N1831 Chronic kidney disease, stage 3a: Secondary | ICD-10-CM | POA: Diagnosis not present

## 2020-12-15 DIAGNOSIS — M62442 Contracture of muscle, left hand: Secondary | ICD-10-CM | POA: Diagnosis not present

## 2020-12-15 DIAGNOSIS — G8114 Spastic hemiplegia affecting left nondominant side: Secondary | ICD-10-CM | POA: Diagnosis not present

## 2020-12-15 DIAGNOSIS — I129 Hypertensive chronic kidney disease with stage 1 through stage 4 chronic kidney disease, or unspecified chronic kidney disease: Secondary | ICD-10-CM | POA: Diagnosis not present

## 2020-12-21 ENCOUNTER — Ambulatory Visit: Payer: Medicare Other | Admitting: Internal Medicine

## 2020-12-22 DIAGNOSIS — N1831 Chronic kidney disease, stage 3a: Secondary | ICD-10-CM | POA: Diagnosis not present

## 2020-12-22 DIAGNOSIS — S069X0S Unspecified intracranial injury without loss of consciousness, sequela: Secondary | ICD-10-CM | POA: Diagnosis not present

## 2020-12-22 DIAGNOSIS — I129 Hypertensive chronic kidney disease with stage 1 through stage 4 chronic kidney disease, or unspecified chronic kidney disease: Secondary | ICD-10-CM | POA: Diagnosis not present

## 2020-12-22 DIAGNOSIS — G8114 Spastic hemiplegia affecting left nondominant side: Secondary | ICD-10-CM | POA: Diagnosis not present

## 2020-12-22 DIAGNOSIS — M62412 Contracture of muscle, left shoulder: Secondary | ICD-10-CM | POA: Diagnosis not present

## 2020-12-22 DIAGNOSIS — M62442 Contracture of muscle, left hand: Secondary | ICD-10-CM | POA: Diagnosis not present

## 2020-12-29 DIAGNOSIS — M62442 Contracture of muscle, left hand: Secondary | ICD-10-CM | POA: Diagnosis not present

## 2020-12-29 DIAGNOSIS — S069X0S Unspecified intracranial injury without loss of consciousness, sequela: Secondary | ICD-10-CM | POA: Diagnosis not present

## 2020-12-29 DIAGNOSIS — M62412 Contracture of muscle, left shoulder: Secondary | ICD-10-CM | POA: Diagnosis not present

## 2020-12-29 DIAGNOSIS — N1831 Chronic kidney disease, stage 3a: Secondary | ICD-10-CM | POA: Diagnosis not present

## 2020-12-29 DIAGNOSIS — I129 Hypertensive chronic kidney disease with stage 1 through stage 4 chronic kidney disease, or unspecified chronic kidney disease: Secondary | ICD-10-CM | POA: Diagnosis not present

## 2020-12-29 DIAGNOSIS — G8114 Spastic hemiplegia affecting left nondominant side: Secondary | ICD-10-CM | POA: Diagnosis not present

## 2021-01-05 ENCOUNTER — Ambulatory Visit (INDEPENDENT_AMBULATORY_CARE_PROVIDER_SITE_OTHER): Payer: Medicare Other | Admitting: *Deleted

## 2021-01-05 DIAGNOSIS — I4891 Unspecified atrial fibrillation: Secondary | ICD-10-CM

## 2021-01-05 DIAGNOSIS — Z5181 Encounter for therapeutic drug level monitoring: Secondary | ICD-10-CM

## 2021-01-05 LAB — POCT INR: INR: 2.7 (ref 2.0–3.0)

## 2021-01-05 NOTE — Patient Instructions (Signed)
Continue warfarin 1 1/2 tablets daily  ?Recheck in 6 weeks in [place] office.   ?

## 2021-01-07 DIAGNOSIS — L84 Corns and callosities: Secondary | ICD-10-CM | POA: Diagnosis not present

## 2021-01-07 DIAGNOSIS — I739 Peripheral vascular disease, unspecified: Secondary | ICD-10-CM | POA: Diagnosis not present

## 2021-01-07 DIAGNOSIS — B351 Tinea unguium: Secondary | ICD-10-CM | POA: Diagnosis not present

## 2021-01-07 DIAGNOSIS — G8190 Hemiplegia, unspecified affecting unspecified side: Secondary | ICD-10-CM | POA: Diagnosis not present

## 2021-02-02 ENCOUNTER — Other Ambulatory Visit: Payer: Self-pay | Admitting: Internal Medicine

## 2021-02-02 NOTE — Telephone Encounter (Signed)
Please refill as per office routine med refill policy (all routine meds to be refilled for 3 mo or monthly (per pt preference) up to one year from last visit, then month to month grace period for 3 mo, then further med refills will have to be denied) ? ?

## 2021-02-16 ENCOUNTER — Ambulatory Visit (INDEPENDENT_AMBULATORY_CARE_PROVIDER_SITE_OTHER): Payer: Medicare Other | Admitting: *Deleted

## 2021-02-16 DIAGNOSIS — I4891 Unspecified atrial fibrillation: Secondary | ICD-10-CM

## 2021-02-16 DIAGNOSIS — Z5181 Encounter for therapeutic drug level monitoring: Secondary | ICD-10-CM

## 2021-02-16 LAB — POCT INR: INR: 2.4 (ref 2.0–3.0)

## 2021-02-16 NOTE — Patient Instructions (Signed)
Continue warfarin 1 1/2 tablets daily  ?Recheck in 6 weeks in [place] office.   ?

## 2021-03-23 ENCOUNTER — Ambulatory Visit (INDEPENDENT_AMBULATORY_CARE_PROVIDER_SITE_OTHER): Payer: Medicare Other | Admitting: Internal Medicine

## 2021-03-23 ENCOUNTER — Other Ambulatory Visit: Payer: Self-pay

## 2021-03-23 ENCOUNTER — Encounter: Payer: Self-pay | Admitting: Internal Medicine

## 2021-03-23 VITALS — BP 128/72 | HR 80 | Resp 18 | Ht 66.5 in

## 2021-03-23 DIAGNOSIS — E538 Deficiency of other specified B group vitamins: Secondary | ICD-10-CM | POA: Diagnosis not present

## 2021-03-23 DIAGNOSIS — E782 Mixed hyperlipidemia: Secondary | ICD-10-CM

## 2021-03-23 DIAGNOSIS — E559 Vitamin D deficiency, unspecified: Secondary | ICD-10-CM | POA: Diagnosis not present

## 2021-03-23 DIAGNOSIS — N1831 Chronic kidney disease, stage 3a: Secondary | ICD-10-CM | POA: Diagnosis not present

## 2021-03-23 DIAGNOSIS — R739 Hyperglycemia, unspecified: Secondary | ICD-10-CM

## 2021-03-23 DIAGNOSIS — I10 Essential (primary) hypertension: Secondary | ICD-10-CM | POA: Diagnosis not present

## 2021-03-23 LAB — LDL CHOLESTEROL, DIRECT: Direct LDL: 78 mg/dL

## 2021-03-23 LAB — CBC WITH DIFFERENTIAL/PLATELET
Basophils Absolute: 0.1 10*3/uL (ref 0.0–0.1)
Basophils Relative: 0.6 % (ref 0.0–3.0)
Eosinophils Absolute: 0.2 10*3/uL (ref 0.0–0.7)
Eosinophils Relative: 1.9 % (ref 0.0–5.0)
HCT: 43.8 % (ref 39.0–52.0)
Hemoglobin: 14.8 g/dL (ref 13.0–17.0)
Lymphocytes Relative: 13.4 % (ref 12.0–46.0)
Lymphs Abs: 1.1 10*3/uL (ref 0.7–4.0)
MCHC: 33.7 g/dL (ref 30.0–36.0)
MCV: 95 fl (ref 78.0–100.0)
Monocytes Absolute: 1.1 10*3/uL — ABNORMAL HIGH (ref 0.1–1.0)
Monocytes Relative: 12.7 % — ABNORMAL HIGH (ref 3.0–12.0)
Neutro Abs: 6.1 10*3/uL (ref 1.4–7.7)
Neutrophils Relative %: 71.4 % (ref 43.0–77.0)
Platelets: 162 10*3/uL (ref 150.0–400.0)
RBC: 4.62 Mil/uL (ref 4.22–5.81)
RDW: 14.2 % (ref 11.5–15.5)
WBC: 8.6 10*3/uL (ref 4.0–10.5)

## 2021-03-23 LAB — BASIC METABOLIC PANEL
BUN: 18 mg/dL (ref 6–23)
CO2: 27 mEq/L (ref 19–32)
Calcium: 9.2 mg/dL (ref 8.4–10.5)
Chloride: 106 mEq/L (ref 96–112)
Creatinine, Ser: 1.6 mg/dL — ABNORMAL HIGH (ref 0.40–1.50)
GFR: 38.87 mL/min — ABNORMAL LOW (ref >60.00)
Glucose, Bld: 94 mg/dL (ref 70–99)
Potassium: 3.9 mEq/L (ref 3.5–5.1)
Sodium: 140 mEq/L (ref 135–145)

## 2021-03-23 LAB — TSH: TSH: 2.48 u[IU]/mL (ref 0.35–5.50)

## 2021-03-23 LAB — HEPATIC FUNCTION PANEL
ALT: 10 U/L (ref 0–53)
AST: 11 U/L (ref 0–37)
Albumin: 4.1 g/dL (ref 3.5–5.2)
Alkaline Phosphatase: 55 U/L (ref 39–117)
Bilirubin, Direct: 0.1 mg/dL (ref 0.0–0.3)
Total Bilirubin: 0.7 mg/dL (ref 0.2–1.2)
Total Protein: 7.2 g/dL (ref 6.0–8.3)

## 2021-03-23 LAB — VITAMIN B12: Vitamin B-12: >1504 pg/mL — ABNORMAL HIGH (ref 211–911)

## 2021-03-23 LAB — LIPID PANEL
Cholesterol: 152 mg/dL (ref 0–200)
HDL: 37.9 mg/dL — ABNORMAL LOW (ref >39.00)
NonHDL: 113.73
Total CHOL/HDL Ratio: 4
Triglycerides: 233 mg/dL — ABNORMAL HIGH (ref 0.0–149.0)
VLDL: 46.6 mg/dL — ABNORMAL HIGH (ref 0.0–40.0)

## 2021-03-23 LAB — VITAMIN D 25 HYDROXY (VIT D DEFICIENCY, FRACTURES): VITD: 56.26 ng/mL (ref 30.00–100.00)

## 2021-03-23 LAB — HEMOGLOBIN A1C: Hgb A1c MFr Bld: 6.2 % (ref 4.6–6.5)

## 2021-03-23 NOTE — Patient Instructions (Signed)
Please continue all other medications as before, and refills have been done if requested. ? ?Please have the pharmacy call with any other refills you may need. ? ?Please continue your efforts at being more active, low cholesterol diet, and weight control. ? ?You are otherwise up to date with prevention measures today. ? ?Please keep your appointments with your specialists as you may have planned - your kidney doctor later this month ? ?Please go to the LAB at the blood drawing area for the tests to be done ? ?You will be contacted by phone if any changes need to be made immediately.  Otherwise, you will receive a letter about your results with an explanation, but please check with MyChart first. ? ?Please remember to sign up for MyChart if you have not done so, as this will be important to you in the future with finding out test results, communicating by private email, and scheduling acute appointments online when needed. ? ?Please make an Appointment to return in 6 months, or sooner if needed ?

## 2021-03-23 NOTE — Progress Notes (Signed)
Patient ID: Austin [name], male   DOB: [birth year], 88 y.o.   MRN: [id number]         Chief Complaint:: yearly exam       HPI:  Austin [name] is a 88 y.o. male overall doing well.  Pt denies chest pain, increased sob or doe, wheezing, orthopnea, PND, increased LE swelling, palpitations, dizziness or syncope.   Pt denies polydipsia, polyuria, or new focal neuro s/s.  Remains with left hemiparesis though can ambulate some short distance with assist.   Pt denies fever, wt loss, night sweats, loss of appetite, or other constitutional symptoms   no other new complaints   Wt Readings from Last 3 Encounters:  08/20/18 200 lb (90.7 kg)  03/04/18 180 lb (81.6 kg)  10/18/17 180 lb (81.6 kg)   BP Readings from Last 3 Encounters:  03/23/21 128/72  09/15/20 (!) 150/82  09/03/20 (!) 146/80   Immunization History  Administered Date(s) Administered   Influenza-Unspecified 01/03/2021   Moderna Sars-Covid-2 Vaccination 03/01/2019, 04/01/2019, 01/12/2020, 01/03/2021   There are no preventive care reminders to display for this patient.     Past Medical History:  Diagnosis Date   ADENOCARCINOMA, PROSTATE, GLEASON GRADE 5 01/21/2009   ALLERGIC RHINITIS 01/14/2007   Bilateral inguinal hernia    Cholecystitis    Cholelithiasis    CKD (chronic kidney disease) stage 3, GFR 30-59 ml/min (HCC) 04/30/2015   COLONIC POLYPS, HX OF 01/14/2007   ELEVATED PROSTATE SPECIFIC ANTIGEN 03/27/2008   ESOPHAGITIS 01/14/2007   GERD (gastroesophageal reflux disease)    HYPERLIPIDEMIA 01/14/2007   HYPERTENSION 01/14/2007   LIBIDO, DECREASED 01/15/2007   PAF (paroxysmal atrial fibrillation) ([place])    Paralysis ([place])    from [place] 02/2013   Past Surgical History:  Procedure Laterality Date   CHOLECYSTECTOMY N/A 08/17/2017   Procedure: LAPAROSCOPIC CHOLECYSTECTOMY WITH INTRAOPERATIVE CHOLANGIOGRAM ERAS PATHWAY;  Surgeon: Judeth Horn, MD;  Location: [place];  Service: General;  Laterality: N/A;   ENDOSCOPIC RETROGRADE  CHOLANGIOPANCREATOGRAPHY (ERCP) WITH PROPOFOL N/A 08/18/2017   Procedure: ENDOSCOPIC RETROGRADE CHOLANGIOPANCREATOGRAPHY (ERCP) WITH PROPOFOL;  Surgeon: Doran Stabler, MD;  Location: [facility];  Service: Endoscopy;  Laterality: N/A;   ERCP N/A 08/21/2017   Procedure: ENDOSCOPIC RETROGRADE CHOLANGIOPANCREATOGRAPHY (ERCP);  Surgeon: Clarene Essex, MD;  Location: [place];  Service: Endoscopy;  Laterality: N/A;   ESOPHAGOGASTRODUODENOSCOPY N/A 08/15/2013   Procedure: ESOPHAGOGASTRODUODENOSCOPY (EGD);  Surgeon: Gwenyth Ober, MD;  [facility] ENDOSCOPY;  Service: General;  Laterality: N/A;   ESOPHAGOGASTRODUODENOSCOPY (EGD) WITH PROPOFOL N/A 08/18/2017   Procedure: ESOPHAGOGASTRODUODENOSCOPY (EGD) WITH PROPOFOL;  Surgeon: Doran Stabler, MD;  Location: [place];  Service: Endoscopy;  Laterality: N/A;   HERNIA REPAIR     Bilateral Inguinal hernias   HIP PINNING,CANNULATED Left 07/18/2017   Procedure: CANNULATED HIP PINNING;  Surgeon: Carole Civil, MD;  Location: [facility];  Service: Orthopedics;  Laterality: Left;   HIP SURGERY     Left   IR EXCHANGE BILIARY DRAIN  05/24/2017   IR EXCHANGE BILIARY DRAIN  07/27/2017   IR PERC CHOLECYSTOSTOMY  04/10/2017   JOINT REPLACEMENT     PANCREATIC STENT PLACEMENT  08/21/2017   Procedure: PANCREATIC STENT PLACEMENT;  Surgeon: Clarene Essex, MD;  Location: [facility] ENDOSCOPY;  Service: Endoscopy;;   PEG PLACEMENT N/A 09/02/2013   Procedure: PERCUTANEOUS ENDOSCOPIC GASTROSTOMY (PEG) PLACEMENT;  Surgeon: Gwenyth Ober, MD;  Location: [place];  Service: General;  Laterality: N/A;   PROSTATE CRYOABLATION     REMOVAL OF  STONES  08/21/2017   Procedure: REMOVAL OF STONES;  Surgeon: Clarene Essex, MD;  Location: [facility] ENDOSCOPY;  Service: Endoscopy;;   SPHINCTEROTOMY  08/21/2017   Procedure: Joan Mayans;  Surgeon: Clarene Essex, MD;  Location: [place];  Service: Endoscopy;;    reports that he has quit smoking. He has never used smokeless tobacco. He reports  current alcohol use. He reports that he does not use drugs. family history includes Arthritis in an other family member; Hypertension in an other family member; Lung cancer in his father. No Known Allergies Current Outpatient Medications on File Prior to Visit  Medication Sig Dispense Refill   acetaminophen (TYLENOL) 325 MG tablet Take 650 mg by mouth every 8 (eight) hours as needed for moderate pain.      calcitRIOL (ROCALTROL) 0.25 MCG capsule Take 1 capsule (0.25 mcg total) by mouth daily. 90 capsule 3   cholecalciferol (VITAMIN D) 1000 units tablet Take 2,000 Units by mouth daily.     Control Gel Formula Dressing (DUODERM CGF DRESSING) MISC Apply 1 each topically daily as needed. 100 each 3   diclofenac sodium (VOLTAREN) 1 % GEL Apply 2 g topically 4 (four) times daily as needed. 100 g 11   diphenoxylate-atropine (LOMOTIL) 2.5-0.025 MG tablet Take 1 tablet by mouth 4 (four) times daily as needed for diarrhea or loose stools. 60 tablet 1   furosemide (LASIX) 20 MG tablet Take 20 mg by mouth daily.     K Phos Mono-Sod Phos Di & Mono (PHOSPHA 250 NEUTRAL) 155-852-130 MG TABS Take 2 tablets by mouth 2 (two) times daily.     omeprazole (PRILOSEC) 40 MG capsule TAKE ONE CAPSULE BY MOUTH ONCE DAILY. 30 capsule 5   rosuvastatin (CRESTOR) 10 MG tablet TAKE ONE TABLET BY MOUTH ONCE DAILY. 90 tablet 3   sodium bicarbonate 650 MG tablet Take 1 tablet (650 mg total) by mouth 2 (two) times daily. 60 tablet 11   vitamin B-12 (CYANOCOBALAMIN) 1000 MCG tablet Take 1 tablet (1,000 mcg total) by mouth daily. 90 tablet 3   warfarin (COUMADIN) 5 MG tablet TAKE 1 & 1/2- 2 TABLETS BY MOUTH DAILY OR AS DIRECTED 60 tablet 5   No current facility-administered medications on file prior to visit.        ROS:  All others reviewed and negative.  Objective        PE:  BP 128/72    Pulse 80    Resp 18    Ht 5' 6.5" (1.689 m)    SpO2 98%    BMI 31.80 kg/m                 Constitutional: Pt appears in NAD                HENT: Head: NCAT.                Right Ear: External ear normal.                 Left Ear: External ear normal.                Eyes: . Pupils are equal, round, and reactive to light. Conjunctivae and EOM are normal               Nose: without d/c or deformity               Neck: Neck supple. Gross normal ROM  Cardiovascular: Normal rate and regular rhythm.                 Pulmonary/Chest: Effort normal and breath sounds without rales or wheezing.                Abd:  Soft, NT, ND, + BS, no organomegaly               Neurological: [name] alert. At baseline orientation, motor grossly intact except left hemiparesis               Skin: Skin is warm. No rashes, no other new lesions, LE edema - trace bilat LEs left > right               Psychiatric: Pt behavior is normal without agitation   Micro: none  Cardiac tracings I have personally interpreted today:  none  Pertinent Radiological findings (summarize): none   Lab Results  Component Value Date   WBC 8.6 03/23/2021   HGB 14.8 03/23/2021   HCT 43.8 03/23/2021   PLT 162.0 03/23/2021   GLUCOSE 94 03/23/2021   CHOL 152 03/23/2021   TRIG 233.0 (H) 03/23/2021   HDL 37.90 (L) 03/23/2021   LDLDIRECT 78.0 03/23/2021   LDLCALC 110 (H) 04/03/2013   ALT 10 03/23/2021   AST 11 03/23/2021   NA 140 03/23/2021   K 3.9 03/23/2021   CL 106 03/23/2021   CREATININE 1.60 (H) 03/23/2021   BUN 18 03/23/2021   CO2 27 03/23/2021   TSH 2.48 03/23/2021   PSA 1.81 03/10/2020   INR 2.4 02/16/2021   HGBA1C 6.2 03/23/2021   Assessment/Plan:  JAXX HUISH is a 88 y.o. White or Caucasian [1] Black or African American [2] male with  has a past medical history of ADENOCARCINOMA, PROSTATE, GLEASON GRADE 5 (01/21/2009), ALLERGIC RHINITIS (01/14/2007), Bilateral inguinal hernia, Cholecystitis, Cholelithiasis, CKD (chronic kidney disease) stage 3, GFR 30-59 ml/min (HCC) (04/30/2015), COLONIC POLYPS, HX OF (01/14/2007), ELEVATED PROSTATE SPECIFIC  ANTIGEN (03/27/2008), ESOPHAGITIS (01/14/2007), GERD (gastroesophageal reflux disease), HYPERLIPIDEMIA (01/14/2007), HYPERTENSION (01/14/2007), LIBIDO, DECREASED (01/15/2007), PAF (paroxysmal atrial fibrillation) ([place]), and Paralysis ([place]).  B12 deficiency Lab Results  Component Value Date   VITAMINB12 >1504 (H) 03/23/2021   Stable, cont oral replacement - b12 1000 mcg qd   CKD (chronic kidney disease) stage 3, GFR 30-59 ml/min (HCC) Lab Results  Component Value Date   CREATININE 1.60 (H) 03/23/2021   Stable overall, cont to avoid nephrotoxins   Essential hypertension BP Readings from Last 3 Encounters:  03/23/21 128/72  09/15/20 (!) 150/82  09/03/20 (!) 146/80   Stable, pt to continue medical treatment  - diet, wt control, low salt   Hyperlipidemia Lab Results  Component Value Date   CHOL 152 03/23/2021   HDL 37.90 (L) 03/23/2021   LDLCALC 110 (H) 04/03/2013   LDLDIRECT 78.0 03/23/2021   TRIG 233.0 (H) 03/23/2021   CHOLHDL 4 03/23/2021   Last LDL - 78 on Mar 23, 2021  Mild uncontrolled, goal ldl < 70 - pt to continue crestor 10 , decliens change for now  Followup: Return in about 6 months (around 09/23/2021).  Cathlean Cower, MD 03/24/2021 9:17 PM [facility]

## 2021-03-24 ENCOUNTER — Encounter: Payer: Self-pay | Admitting: Internal Medicine

## 2021-03-24 NOTE — Assessment & Plan Note (Signed)
Lab Results  ?Component Value Date  ? [id number] >1504 (H) 03/23/2021  ? ?Stable, cont oral replacement - b12 1000 mcg qd ? ?

## 2021-03-24 NOTE — Assessment & Plan Note (Signed)
BP Readings from Last 3 Encounters:  ?03/23/21 128/72  ?09/15/20 (!) 150/82  ?09/03/20 (!) 146/80  ? ?Stable, pt to continue medical treatment  - diet, wt control, low salt ? ?

## 2021-03-24 NOTE — Assessment & Plan Note (Signed)
Lab Results  ?Component Value Date  ? CREATININE 1.60 (H) 03/23/2021  ? ?Stable overall, cont to avoid nephrotoxins ? ?

## 2021-03-24 NOTE — Assessment & Plan Note (Signed)
Lab Results  ?Component Value Date  ? CHOL 152 03/23/2021  ? HDL 37.90 (L) 03/23/2021  ? LDLCALC 110 (H) 04/03/2013  ? LDLDIRECT 78.0 03/23/2021  ? TRIG 233.0 (H) 03/23/2021  ? CHOLHDL 4 03/23/2021  ? ?Last LDL - 78 on Mar 23, 2021 ? ?Mild uncontrolled, goal ldl < 70 - pt to continue crestor 10 , decliens change for now ? ?

## 2021-03-30 ENCOUNTER — Ambulatory Visit (INDEPENDENT_AMBULATORY_CARE_PROVIDER_SITE_OTHER): Payer: Medicare Other | Admitting: *Deleted

## 2021-03-30 DIAGNOSIS — Z5181 Encounter for therapeutic drug level monitoring: Secondary | ICD-10-CM

## 2021-03-30 DIAGNOSIS — I4891 Unspecified atrial fibrillation: Secondary | ICD-10-CM | POA: Diagnosis not present

## 2021-03-30 LAB — POCT INR: INR: 2.3 (ref 2.0–3.0)

## 2021-03-30 NOTE — Patient Instructions (Signed)
Continue warfarin 1 1/2 tablets daily  ?Recheck in 6 weeks in [place] office.   ?

## 2021-04-13 DIAGNOSIS — N184 Chronic kidney disease, stage 4 (severe): Secondary | ICD-10-CM | POA: Diagnosis not present

## 2021-04-21 DIAGNOSIS — D631 Anemia in chronic kidney disease: Secondary | ICD-10-CM | POA: Diagnosis not present

## 2021-04-21 DIAGNOSIS — E870 Hyperosmolality and hypernatremia: Secondary | ICD-10-CM | POA: Diagnosis not present

## 2021-04-21 DIAGNOSIS — E872 Acidosis, unspecified: Secondary | ICD-10-CM | POA: Diagnosis not present

## 2021-04-21 DIAGNOSIS — N2581 Secondary hyperparathyroidism of renal origin: Secondary | ICD-10-CM | POA: Diagnosis not present

## 2021-04-21 DIAGNOSIS — I129 Hypertensive chronic kidney disease with stage 1 through stage 4 chronic kidney disease, or unspecified chronic kidney disease: Secondary | ICD-10-CM | POA: Diagnosis not present

## 2021-04-21 DIAGNOSIS — G839 Paralytic syndrome, unspecified: Secondary | ICD-10-CM | POA: Diagnosis not present

## 2021-04-21 DIAGNOSIS — N1832 Chronic kidney disease, stage 3b: Secondary | ICD-10-CM | POA: Diagnosis not present

## 2021-04-28 DIAGNOSIS — B351 Tinea unguium: Secondary | ICD-10-CM | POA: Diagnosis not present

## 2021-04-28 DIAGNOSIS — L84 Corns and callosities: Secondary | ICD-10-CM | POA: Diagnosis not present

## 2021-04-28 DIAGNOSIS — I739 Peripheral vascular disease, unspecified: Secondary | ICD-10-CM | POA: Diagnosis not present

## 2021-04-28 DIAGNOSIS — G8190 Hemiplegia, unspecified affecting unspecified side: Secondary | ICD-10-CM | POA: Diagnosis not present

## 2021-05-11 ENCOUNTER — Ambulatory Visit (INDEPENDENT_AMBULATORY_CARE_PROVIDER_SITE_OTHER): Payer: Medicare Other | Admitting: *Deleted

## 2021-05-11 DIAGNOSIS — I4891 Unspecified atrial fibrillation: Secondary | ICD-10-CM | POA: Diagnosis not present

## 2021-05-11 DIAGNOSIS — Z5181 Encounter for therapeutic drug level monitoring: Secondary | ICD-10-CM | POA: Diagnosis not present

## 2021-05-11 LAB — POCT INR: INR: 2 (ref 2.0–3.0)

## 2021-05-11 NOTE — Patient Instructions (Signed)
Continue warfarin 1 1/2 tablets daily  ?Recheck in 6 weeks in [place] office.   ?

## 2021-05-30 ENCOUNTER — Other Ambulatory Visit: Payer: Self-pay | Admitting: Cardiology

## 2021-05-30 DIAGNOSIS — Z20822 Contact with and (suspected) exposure to covid-19: Secondary | ICD-10-CM | POA: Diagnosis not present

## 2021-06-22 ENCOUNTER — Ambulatory Visit (INDEPENDENT_AMBULATORY_CARE_PROVIDER_SITE_OTHER): Payer: Medicare Other | Admitting: *Deleted

## 2021-06-22 DIAGNOSIS — I4891 Unspecified atrial fibrillation: Secondary | ICD-10-CM | POA: Diagnosis not present

## 2021-06-22 DIAGNOSIS — Z5181 Encounter for therapeutic drug level monitoring: Secondary | ICD-10-CM

## 2021-06-22 LAB — POCT INR: INR: 1.7 — AB (ref 2.0–3.0)

## 2021-06-22 NOTE — Patient Instructions (Signed)
Take warfarin 2 tablets tonight then increase dose to 1 1/2 tablets daily except 2 tablets on Mondays and Thursdays Recheck in 4 weeks in [place] office.

## 2021-06-28 DIAGNOSIS — M79672 Pain in left foot: Secondary | ICD-10-CM | POA: Diagnosis not present

## 2021-06-28 DIAGNOSIS — L97522 Non-pressure chronic ulcer of other part of left foot with fat layer exposed: Secondary | ICD-10-CM | POA: Diagnosis not present

## 2021-06-28 DIAGNOSIS — L03116 Cellulitis of left lower limb: Secondary | ICD-10-CM | POA: Diagnosis not present

## 2021-07-07 DIAGNOSIS — I739 Peripheral vascular disease, unspecified: Secondary | ICD-10-CM | POA: Diagnosis not present

## 2021-07-07 DIAGNOSIS — B351 Tinea unguium: Secondary | ICD-10-CM | POA: Diagnosis not present

## 2021-07-07 DIAGNOSIS — L84 Corns and callosities: Secondary | ICD-10-CM | POA: Diagnosis not present

## 2021-07-07 DIAGNOSIS — G8190 Hemiplegia, unspecified affecting unspecified side: Secondary | ICD-10-CM | POA: Diagnosis not present

## 2021-07-12 DIAGNOSIS — G8190 Hemiplegia, unspecified affecting unspecified side: Secondary | ICD-10-CM | POA: Diagnosis not present

## 2021-07-12 DIAGNOSIS — L97522 Non-pressure chronic ulcer of other part of left foot with fat layer exposed: Secondary | ICD-10-CM | POA: Diagnosis not present

## 2021-07-15 DIAGNOSIS — H43813 Vitreous degeneration, bilateral: Secondary | ICD-10-CM | POA: Diagnosis not present

## 2021-07-15 DIAGNOSIS — H26493 Other secondary cataract, bilateral: Secondary | ICD-10-CM | POA: Diagnosis not present

## 2021-07-15 DIAGNOSIS — H4720 Unspecified optic atrophy: Secondary | ICD-10-CM | POA: Diagnosis not present

## 2021-07-19 ENCOUNTER — Ambulatory Visit (INDEPENDENT_AMBULATORY_CARE_PROVIDER_SITE_OTHER): Payer: Medicare Other | Admitting: *Deleted

## 2021-07-19 DIAGNOSIS — Z5181 Encounter for therapeutic drug level monitoring: Secondary | ICD-10-CM

## 2021-07-19 DIAGNOSIS — I4891 Unspecified atrial fibrillation: Secondary | ICD-10-CM | POA: Diagnosis not present

## 2021-07-19 LAB — POCT INR: INR: 2.5 (ref 2.0–3.0)

## 2021-08-12 ENCOUNTER — Telehealth: Payer: Self-pay

## 2021-08-12 NOTE — Telephone Encounter (Signed)
Tried to call pt to set up AWV visit. Please schedule when pt calls thanks.

## 2021-08-16 ENCOUNTER — Ambulatory Visit (INDEPENDENT_AMBULATORY_CARE_PROVIDER_SITE_OTHER): Payer: Medicare Other | Admitting: *Deleted

## 2021-08-16 DIAGNOSIS — Z5181 Encounter for therapeutic drug level monitoring: Secondary | ICD-10-CM | POA: Diagnosis not present

## 2021-08-16 DIAGNOSIS — I4891 Unspecified atrial fibrillation: Secondary | ICD-10-CM

## 2021-08-16 LAB — POCT INR: INR: 1.7 — AB (ref 2.0–3.0)

## 2021-08-16 NOTE — Patient Instructions (Signed)
Take warfarin 2 1/2 tablets tonight then resume 1 1/2 tablets daily except 2 tablets on Mondays and Thursdays Recheck in 3 weeks in [place] office.

## 2021-09-06 ENCOUNTER — Ambulatory Visit (INDEPENDENT_AMBULATORY_CARE_PROVIDER_SITE_OTHER): Payer: Medicare Other | Admitting: *Deleted

## 2021-09-06 DIAGNOSIS — I4891 Unspecified atrial fibrillation: Secondary | ICD-10-CM | POA: Diagnosis not present

## 2021-09-06 DIAGNOSIS — Z5181 Encounter for therapeutic drug level monitoring: Secondary | ICD-10-CM | POA: Diagnosis not present

## 2021-09-06 LAB — POCT INR: INR: 2.1 (ref 2.0–3.0)

## 2021-09-06 NOTE — Patient Instructions (Signed)
Continue warfarin 1 1/2 tablets daily except 2 tablets on Mondays and Thursdays Recheck in 4 weeks in [place] office.

## 2021-09-13 DIAGNOSIS — H26491 Other secondary cataract, right eye: Secondary | ICD-10-CM | POA: Diagnosis not present

## 2021-09-15 DIAGNOSIS — L84 Corns and callosities: Secondary | ICD-10-CM | POA: Diagnosis not present

## 2021-09-15 DIAGNOSIS — B351 Tinea unguium: Secondary | ICD-10-CM | POA: Diagnosis not present

## 2021-09-15 DIAGNOSIS — I739 Peripheral vascular disease, unspecified: Secondary | ICD-10-CM | POA: Diagnosis not present

## 2021-09-15 DIAGNOSIS — G8190 Hemiplegia, unspecified affecting unspecified side: Secondary | ICD-10-CM | POA: Diagnosis not present

## 2021-09-28 ENCOUNTER — Ambulatory Visit (INDEPENDENT_AMBULATORY_CARE_PROVIDER_SITE_OTHER): Payer: Medicare Other | Admitting: Internal Medicine

## 2021-09-28 VITALS — BP 132/76 | HR 65

## 2021-09-28 DIAGNOSIS — E538 Deficiency of other specified B group vitamins: Secondary | ICD-10-CM

## 2021-09-28 DIAGNOSIS — I10 Essential (primary) hypertension: Secondary | ICD-10-CM

## 2021-09-28 DIAGNOSIS — N1831 Chronic kidney disease, stage 3a: Secondary | ICD-10-CM | POA: Diagnosis not present

## 2021-09-28 DIAGNOSIS — C61 Malignant neoplasm of prostate: Secondary | ICD-10-CM | POA: Diagnosis not present

## 2021-09-28 DIAGNOSIS — E782 Mixed hyperlipidemia: Secondary | ICD-10-CM | POA: Diagnosis not present

## 2021-09-28 NOTE — Patient Instructions (Addendum)
Please have your Shingrix (shingles) shots done at your local pharmacy.  Please continue all other medications as before, and refills have been done if requested.  Please have the pharmacy call with any other refills you may need.  Please continue your efforts at being more active, low cholesterol diet, and weight control.  You are otherwise up to date with prevention measures today.  Please keep your appointments with your specialists as you may have planned -  kidney doctor next wk  Please make an Appointment to return in 6 months, or sooner if needed

## 2021-09-28 NOTE — Progress Notes (Signed)
Patient ID: Austin [name], male   DOB: Aug 29, 1932, 88 y.o.   MRN: [id number]        Chief Complaint: follow up prostate ca, low b12, hld, ckd       HPI:  Austin [name] is a 88 y.o. male here overall doing ok,, has not been f/u with urology recently, has some decreased urinary slowing, but Denies urinary symptoms such as dysuria, frequency, urgency, flank pain, hematuria or n/v, fever, chills.  Pt denies chest pain, increased sob or doe, wheezing, orthopnea, PND, increased LE swelling, palpitations, dizziness or syncope.   Pt denies polydipsia, polyuria, or new focal neuro s/s.   Pt denies fever, wt loss, night sweats, loss of appetite, or other constitutional symptoms Taking B12 daily.         Wt Readings from Last 3 Encounters:  08/20/18 200 lb (90.7 kg)  03/04/18 180 lb (81.6 kg)  10/18/17 180 lb (81.6 kg)   BP Readings from Last 3 Encounters:  09/28/21 132/76  03/23/21 128/72  09/15/20 (!) 150/82         Past Medical History:  Diagnosis Date   ADENOCARCINOMA, PROSTATE, GLEASON GRADE 5 01/21/2009   ALLERGIC RHINITIS 01/14/2007   Bilateral inguinal hernia    Cholecystitis    Cholelithiasis    CKD (chronic kidney disease) stage 3, GFR 30-59 ml/min (HCC) 04/30/2015   COLONIC POLYPS, HX OF 01/14/2007   ELEVATED PROSTATE SPECIFIC ANTIGEN 03/27/2008   ESOPHAGITIS 01/14/2007   GERD (gastroesophageal reflux disease)    HYPERLIPIDEMIA 01/14/2007   HYPERTENSION 01/14/2007   LIBIDO, DECREASED 01/15/2007   PAF (paroxysmal atrial fibrillation) ([place])    Paralysis ([place])    from [place] 02/2013   Past Surgical History:  Procedure Laterality Date   CHOLECYSTECTOMY N/A 08/17/2017   Procedure: LAPAROSCOPIC CHOLECYSTECTOMY WITH INTRAOPERATIVE CHOLANGIOGRAM ERAS PATHWAY;  Surgeon: Judeth Horn, MD;  Location: [facility];  Service: General;  Laterality: N/A;   ENDOSCOPIC RETROGRADE CHOLANGIOPANCREATOGRAPHY (ERCP) WITH PROPOFOL N/A 08/18/2017   Procedure: ENDOSCOPIC RETROGRADE CHOLANGIOPANCREATOGRAPHY  (ERCP) WITH PROPOFOL;  Surgeon: Doran Stabler, MD;  Location: [facility]OSCOPY;  Service: Endoscopy;  Laterality: N/A;   ERCP N/A 08/21/2017   Procedure: ENDOSCOPIC RETROGRADE CHOLANGIOPANCREATOGRAPHY (ERCP);  Surgeon: Clarene Essex, MD;  Location: [place];  Service: Endoscopy;  Laterality: N/A;   ESOPHAGOGASTRODUODENOSCOPY N/A 08/15/2013   Procedure: ESOPHAGOGASTRODUODENOSCOPY (EGD);  Surgeon: Gwenyth Ober, MD;  [facility] ENDOSCOPY;  Service: General;  Laterality: N/A;   ESOPHAGOGASTRODUODENOSCOPY (EGD) WITH PROPOFOL N/A 08/18/2017   Procedure: ESOPHAGOGASTRODUODENOSCOPY (EGD) WITH PROPOFOL;  Surgeon: Doran Stabler, MD;  Location: [place];  Service: Endoscopy;  Laterality: N/A;   HERNIA REPAIR     Bilateral Inguinal hernias   HIP PINNING,CANNULATED Left 07/18/2017   Procedure: CANNULATED HIP PINNING;  Surgeon: Carole Civil, MD;  [facility];  Service: Orthopedics;  Laterality: Left;   HIP SURGERY     Left   IR EXCHANGE BILIARY DRAIN  05/24/2017   IR EXCHANGE BILIARY DRAIN  07/27/2017   IR PERC CHOLECYSTOSTOMY  04/10/2017   JOINT REPLACEMENT     PANCREATIC STENT PLACEMENT  08/21/2017   Procedure: PANCREATIC STENT PLACEMENT;  Surgeon: Clarene Essex, MD;  Location: [place];  Service: Endoscopy;;   PEG PLACEMENT N/A 09/02/2013   Procedure: PERCUTANEOUS ENDOSCOPIC GASTROSTOMY (PEG) PLACEMENT;  Surgeon: Gwenyth Ober, MD;  Location: [place];  Service: General;  Laterality: N/A;   PROSTATE CRYOABLATION     REMOVAL OF STONES  08/21/2017   Procedure: REMOVAL  OF STONES;  Surgeon: Clarene Essex, MD;  Location: [facility] ENDOSCOPY;  Service: Endoscopy;;   SPHINCTEROTOMY  08/21/2017   Procedure: Joan Mayans;  Surgeon: Clarene Essex, MD;  Location: [place];  Service: Endoscopy;;    reports that he has quit smoking. He has never used smokeless tobacco. He reports current alcohol use. He reports that he does not use drugs. family history includes Arthritis in an other family  member; Hypertension in an other family member; Lung cancer in his father. No Known Allergies Current Outpatient Medications on File Prior to Visit  Medication Sig Dispense Refill   acetaminophen (TYLENOL) 325 MG tablet Take 650 mg by mouth every 8 (eight) hours as needed for moderate pain.      calcitRIOL (ROCALTROL) 0.25 MCG capsule Take 1 capsule (0.25 mcg total) by mouth daily. 90 capsule 3   cholecalciferol (VITAMIN D) 1000 units tablet Take 2,000 Units by mouth daily.     Control Gel Formula Dressing (DUODERM CGF DRESSING) MISC Apply 1 each topically daily as needed. 100 each 3   diclofenac sodium (VOLTAREN) 1 % GEL Apply 2 g topically 4 (four) times daily as needed. 100 g 11   diphenoxylate-atropine (LOMOTIL) 2.5-0.025 MG tablet Take 1 tablet by mouth 4 (four) times daily as needed for diarrhea or loose stools. 60 tablet 1   K Phos Mono-Sod Phos Di & Mono (PHOSPHA 250 NEUTRAL) 155-852-130 MG TABS Take 2 tablets by mouth 2 (two) times daily.     rosuvastatin (CRESTOR) 10 MG tablet TAKE ONE TABLET BY MOUTH ONCE DAILY. 90 tablet 3   sodium bicarbonate 650 MG tablet Take 1 tablet (650 mg total) by mouth 2 (two) times daily. 60 tablet 11   vitamin B-12 (CYANOCOBALAMIN) 1000 MCG tablet Take 1 tablet (1,000 mcg total) by mouth daily. 90 tablet 3   warfarin (COUMADIN) 5 MG tablet TAKE 1 & 1/2 TO 2 TABLETS BY MOUTH DAILY OR AS DIRECTED 60 tablet 5   No current facility-administered medications on file prior to visit.        ROS:  All others reviewed and negative.  Objective        PE:  BP 132/76 (BP Location: Right Arm, Patient Position: Sitting, Cuff Size: Large)   Pulse 65   SpO2 93%                 Constitutional: Pt appears in NAD               HENT: Head: NCAT.                Right Ear: External ear normal.                 Left Ear: External ear normal.                Eyes: . Pupils are equal, round, and reactive to light. Conjunctivae and EOM are normal               Nose: without  d/c or deformity               Neck: Neck supple. Gross normal ROM               Cardiovascular: Normal rate and regular rhythm.                 Pulmonary/Chest: Effort normal and breath sounds without rales or wheezing.  Abd:  Soft, NT, ND, + BS, no organomegaly               Neurological: [name] alert. At baseline orientation, motor grossly intact               Skin: Skin is warm. No rashes, no other new lesions, LE edema - trace to 1+ bilateral               Psychiatric: Pt behavior is normal without agitation   Micro: none  Cardiac tracings I have personally interpreted today:  none  Pertinent Radiological findings (summarize): none   Lab Results  Component Value Date   WBC 8.6 03/23/2021   HGB 14.8 03/23/2021   HCT 43.8 03/23/2021   PLT 162.0 03/23/2021   GLUCOSE 94 03/23/2021   CHOL 152 03/23/2021   TRIG 233.0 (H) 03/23/2021   HDL 37.90 (L) 03/23/2021   LDLDIRECT 78.0 03/23/2021   LDLCALC 110 (H) 04/03/2013   ALT 10 03/23/2021   AST 11 03/23/2021   NA 140 03/23/2021   K 3.9 03/23/2021   CL 106 03/23/2021   CREATININE 1.60 (H) 03/23/2021   BUN 18 03/23/2021   CO2 27 03/23/2021   TSH 2.48 03/23/2021   PSA 1.81 03/10/2020   INR 2.1 09/06/2021   HGBA1C 6.2 03/23/2021   Assessment/Plan:  Austin [name] is a 88 y.o. White or Caucasian [1] Black or African American [2] male with  has a past medical history of ADENOCARCINOMA, PROSTATE, GLEASON GRADE 5 (01/21/2009), ALLERGIC RHINITIS (01/14/2007), Bilateral inguinal hernia, Cholecystitis, Cholelithiasis, CKD (chronic kidney disease) stage 3, GFR 30-59 ml/min (HCC) (04/30/2015), COLONIC POLYPS, HX OF (01/14/2007), ELEVATED PROSTATE SPECIFIC ANTIGEN (03/27/2008), ESOPHAGITIS (01/14/2007), GERD (gastroesophageal reflux disease), HYPERLIPIDEMIA (01/14/2007), HYPERTENSION (01/14/2007), LIBIDO, DECREASED (01/15/2007), PAF (paroxysmal atrial fibrillation) ([place]), and Paralysis ([place]).  ADENOCARCINOMA, PROSTATE, GLEASON GRADE  5 Pt due for f/u, refer urology  B12 deficiency Lab Results  Component Value Date   VITAMINB12 >1504 (H) 03/23/2021   Overcontrolled,  cont oral replacement - b12 1000 mcg - decreased to mon - wed - fri  CKD (chronic kidney disease) stage 3, GFR 30-59 ml/min (HCC) Lab Results  Component Value Date   CREATININE 1.60 (H) 03/23/2021   Stable overall, cont to avoid nephrotoxins, f/u renal as planned next wk   Essential hypertension BP Readings from Last 3 Encounters:  09/28/21 132/76  03/23/21 128/72  09/15/20 (!) 150/82   Stable, pt to continue medical treatment  - wt control, low salt  HLD (hyperlipidemia) Lab Results  Component Value Date   LDLCALC 110 (H) 04/03/2013   Most recent LDL 78 - uncontrolled, goal ldl < 70,  pt to continue current crestor 10 mg qd, decliens add zetia for now, for lower chol diet  Followup: Return in about 6 months (around 03/29/2022).  Cathlean Cower, MD 10/01/2021 12:47 PM [facility]

## 2021-10-01 ENCOUNTER — Encounter: Payer: Self-pay | Admitting: Internal Medicine

## 2021-10-01 NOTE — Assessment & Plan Note (Signed)
BP Readings from Last 3 Encounters:  09/28/21 132/76  03/23/21 128/72  09/15/20 (!) 150/82   Stable, pt to continue medical treatment  - wt control, low salt

## 2021-10-01 NOTE — Assessment & Plan Note (Signed)
Pt due for f/u, refer urology

## 2021-10-01 NOTE — Assessment & Plan Note (Signed)
Lab Results  Component Value Date   VITAMINB12 >1504 (H) 03/23/2021   Overcontrolled,  cont oral replacement - b12 1000 mcg - decreased to mon - wed - fri

## 2021-10-01 NOTE — Assessment & Plan Note (Addendum)
Lab Results  Component Value Date   CREATININE 1.60 (H) 03/23/2021   Stable overall, cont to avoid nephrotoxins, f/u renal as planned next wk

## 2021-10-01 NOTE — Assessment & Plan Note (Signed)
Lab Results  Component Value Date   LDLCALC 110 (H) 04/03/2013   Most recent LDL 78 - uncontrolled, goal ldl < 70,  pt to continue current crestor 10 mg qd, decliens add zetia for now, for lower chol diet

## 2021-10-04 ENCOUNTER — Ambulatory Visit: Payer: Medicare Other | Attending: Cardiology | Admitting: *Deleted

## 2021-10-04 DIAGNOSIS — I4891 Unspecified atrial fibrillation: Secondary | ICD-10-CM

## 2021-10-04 DIAGNOSIS — Z5181 Encounter for therapeutic drug level monitoring: Secondary | ICD-10-CM | POA: Insufficient documentation

## 2021-10-04 LAB — POCT INR: INR: 1.7 — AB (ref 2.0–3.0)

## 2021-10-04 NOTE — Patient Instructions (Signed)
Increase warfarin to 2 tablets daily except 1 1/2 tablets on Mondays, Wednesdays and Fridays Recheck in 3 weeks in [place] office.

## 2021-10-06 DIAGNOSIS — N1832 Chronic kidney disease, stage 3b: Secondary | ICD-10-CM | POA: Diagnosis not present

## 2021-10-06 DIAGNOSIS — D631 Anemia in chronic kidney disease: Secondary | ICD-10-CM | POA: Diagnosis not present

## 2021-10-06 DIAGNOSIS — E872 Acidosis, unspecified: Secondary | ICD-10-CM | POA: Diagnosis not present

## 2021-10-06 DIAGNOSIS — N2581 Secondary hyperparathyroidism of renal origin: Secondary | ICD-10-CM | POA: Diagnosis not present

## 2021-10-06 DIAGNOSIS — E870 Hyperosmolality and hypernatremia: Secondary | ICD-10-CM | POA: Diagnosis not present

## 2021-10-06 DIAGNOSIS — I129 Hypertensive chronic kidney disease with stage 1 through stage 4 chronic kidney disease, or unspecified chronic kidney disease: Secondary | ICD-10-CM | POA: Diagnosis not present

## 2021-10-06 DIAGNOSIS — G839 Paralytic syndrome, unspecified: Secondary | ICD-10-CM | POA: Diagnosis not present

## 2021-10-06 DIAGNOSIS — N189 Chronic kidney disease, unspecified: Secondary | ICD-10-CM | POA: Diagnosis not present

## 2021-10-07 LAB — CBC AND DIFFERENTIAL
Hemoglobin: 14.7 (ref 13.5–17.5)
Platelets: 166 10*3/uL (ref 150–400)
WBC: 7.6

## 2021-10-11 LAB — CBC: RBC: 4.6 (ref 3.87–5.11)

## 2021-10-11 LAB — BASIC METABOLIC PANEL
BUN: 20 (ref 4–21)
Chloride: 107 (ref 99–108)
Glucose: 115
Potassium: 3.9 mEq/L (ref 3.5–5.1)
Sodium: 142 (ref 137–147)

## 2021-10-11 LAB — IRON,TIBC AND FERRITIN PANEL: Iron: 75

## 2021-10-11 LAB — COMPREHENSIVE METABOLIC PANEL
Albumin: 4.3 (ref 3.5–5.0)
Calcium: 9.4 (ref 8.7–10.7)

## 2021-10-25 ENCOUNTER — Ambulatory Visit: Payer: Medicare Other | Attending: Cardiology | Admitting: *Deleted

## 2021-10-25 DIAGNOSIS — I4891 Unspecified atrial fibrillation: Secondary | ICD-10-CM

## 2021-10-25 DIAGNOSIS — Z5181 Encounter for therapeutic drug level monitoring: Secondary | ICD-10-CM | POA: Diagnosis not present

## 2021-10-25 LAB — POCT INR: INR: 2.5 (ref 2.0–3.0)

## 2021-10-25 NOTE — Patient Instructions (Signed)
Continue warfarin 2 tablets daily except 1 1/2 tablets on Mondays, Wednesdays and Fridays Recheck in 4 weeks in [place] office.

## 2021-11-14 ENCOUNTER — Telehealth: Payer: Self-pay | Admitting: Internal Medicine

## 2021-11-14 NOTE — Telephone Encounter (Signed)
LVM for pt to rtn my call to schedule AWV with [facility] call back # [phone] 

## 2021-11-22 ENCOUNTER — Ambulatory Visit: Payer: Medicare Other | Attending: Cardiology | Admitting: *Deleted

## 2021-11-22 DIAGNOSIS — I4891 Unspecified atrial fibrillation: Secondary | ICD-10-CM

## 2021-11-22 DIAGNOSIS — Z5181 Encounter for therapeutic drug level monitoring: Secondary | ICD-10-CM

## 2021-11-22 LAB — POCT INR: INR: 2.4 (ref 2.0–3.0)

## 2021-11-22 NOTE — Patient Instructions (Signed)
Continue warfarin 2 tablets daily except 1 1/2 tablets on Mondays, Wednesdays and Fridays Recheck in 6 weeks in [place] office.

## 2021-11-30 NOTE — Progress Notes (Signed)
Subjective:   Austin [name] is a 88 y.o. male who presents for Medicare Annual/Subsequent preventive examination. I connected with  Austin [name] on 12/01/21 by a audio enabled telemedicine application and verified that I am speaking with the correct person using two identifiers.  Patient Location: Home  Provider Location: Home Office  I discussed the limitations of evaluation and management by telemedicine. The patient expressed understanding and agreed to proceed.  Review of Systems    Deferred to PCP Cardiac Risk Factors include: advanced age (>105mn, >>100women);dyslipidemia;male gender;hypertension;obesity (BMI >30kg/m2)     Objective:    There were no vitals filed for this visit. There is no height or weight on file to calculate BMI.     12/01/2021   12:51 PM 06/23/2020    1:28 PM 05/19/2019   11:37 AM 08/20/2018    2:00 PM 09/05/2017    9:41 AM 09/04/2017    8:24 AM 09/03/2017    2:31 PM  Advanced Directives  Does Patient Have a Medical Advance Directive? Yes Yes Yes Yes Yes Yes Yes  Type of Advance Directive Out of facility DNR (pink MOST or yellow form) Living will;Healthcare Power of [place] Out of facility DNR (pink MOST or yellow form) Out of facility DNR (pink MOST or yellow form) Out of facility DNR (pink MOST or yellow form)  Does patient want to make changes to medical advance directive?  No - Patient declined No - Patient declined No - Patient declined No - Patient declined No - Patient declined No - Patient declined  Copy of [place] Chart?   No - copy requested No - copy requested No - copy requested No - copy requested No - copy requested  Pre-existing out of facility DNR order (yellow form or pink MOST form) Pink Most/Yellow Form available - Physician notified to receive inpatient order          Current Medications (verified) Outpatient Encounter  Medications as of 12/01/2021  Medication Sig   acetaminophen (TYLENOL) 325 MG tablet Take 650 mg by mouth every 8 (eight) hours as needed for moderate pain.    calcitRIOL (ROCALTROL) 0.25 MCG capsule Take 1 capsule (0.25 mcg total) by mouth daily.   cholecalciferol (VITAMIN D) 1000 units tablet Take 2,000 Units by mouth daily.   diclofenac sodium (VOLTAREN) 1 % GEL Apply 2 g topically 4 (four) times daily as needed.   diphenoxylate-atropine (LOMOTIL) 2.5-0.025 MG tablet Take 1 tablet by mouth 4 (four) times daily as needed for diarrhea or loose stools.   K Phos Mono-Sod Phos Di & Mono (PHOSPHA 250 NEUTRAL) 155-852-130 MG TABS Take 2 tablets by mouth 2 (two) times daily.   rosuvastatin (CRESTOR) 10 MG tablet TAKE ONE TABLET BY MOUTH ONCE DAILY.   sodium bicarbonate 650 MG tablet Take 1 tablet (650 mg total) by mouth 2 (two) times daily.   vitamin B-12 (CYANOCOBALAMIN) 1000 MCG tablet Take 1 tablet (1,000 mcg total) by mouth daily.   warfarin (COUMADIN) 5 MG tablet TAKE 1 & 1/2 TO 2 TABLETS BY MOUTH DAILY OR AS DIRECTED   Control Gel Formula Dressing (DUODERM CGF DRESSING) MISC Apply 1 each topically daily as needed. (Patient not taking: Reported on 12/01/2021)   No facility-administered encounter medications on file as of 12/01/2021.    Allergies (verified) Patient has no known allergies.   History: Past Medical History:  Diagnosis Date   ADENOCARCINOMA, PROSTATE, GLEASON  GRADE 5 01/21/2009   ALLERGIC RHINITIS 01/14/2007   Bilateral inguinal hernia    Cholecystitis    Cholelithiasis    CKD (chronic kidney disease) stage 3, GFR 30-59 ml/min (HCC) 04/30/2015   COLONIC POLYPS, HX OF 01/14/2007   ELEVATED PROSTATE SPECIFIC ANTIGEN 03/27/2008   ESOPHAGITIS 01/14/2007   GERD (gastroesophageal reflux disease)    HYPERLIPIDEMIA 01/14/2007   HYPERTENSION 01/14/2007   LIBIDO, DECREASED 01/15/2007   PAF (paroxysmal atrial fibrillation) ([place])    Paralysis ([place])    from [place] 02/2013   Past Surgical  History:  Procedure Laterality Date   CHOLECYSTECTOMY N/A 08/17/2017   Procedure: LAPAROSCOPIC CHOLECYSTECTOMY WITH INTRAOPERATIVE CHOLANGIOGRAM ERAS PATHWAY;  Surgeon: Judeth Horn, MD;  Location: [facility];  Service: General;  Laterality: N/A;   ENDOSCOPIC RETROGRADE CHOLANGIOPANCREATOGRAPHY (ERCP) WITH PROPOFOL N/A 08/18/2017   Procedure: ENDOSCOPIC RETROGRADE CHOLANGIOPANCREATOGRAPHY (ERCP) WITH PROPOFOL;  Surgeon: Doran Stabler, MD;  Location: [facility];  Service: Endoscopy;  Laterality: N/A;   ERCP N/A 08/21/2017   Procedure: ENDOSCOPIC RETROGRADE CHOLANGIOPANCREATOGRAPHY (ERCP);  Surgeon: Clarene Essex, MD;  Location: [facility];  Service: Endoscopy;  Laterality: N/A;   ESOPHAGOGASTRODUODENOSCOPY N/A 08/15/2013   Procedure: ESOPHAGOGASTRODUODENOSCOPY (EGD);  Surgeon: Gwenyth Ober, MD;  [facility];  Service: General;  Laterality: N/A;   ESOPHAGOGASTRODUODENOSCOPY (EGD) WITH PROPOFOL N/A 08/18/2017   Procedure: ESOPHAGOGASTRODUODENOSCOPY (EGD) WITH PROPOFOL;  Surgeon: Doran Stabler, MD;  Location: [facility];  Service: Endoscopy;  Laterality: N/A;   HERNIA REPAIR     Bilateral Inguinal hernias   HIP PINNING,CANNULATED Left 07/18/2017   Procedure: CANNULATED HIP PINNING;  Surgeon: Carole Civil, MD;  Location: [facility];  Service: Orthopedics;  Laterality: Left;   HIP SURGERY     Left   IR EXCHANGE BILIARY DRAIN  05/24/2017   IR EXCHANGE BILIARY DRAIN  07/27/2017   IR PERC CHOLECYSTOSTOMY  04/10/2017   JOINT REPLACEMENT     PANCREATIC STENT PLACEMENT  08/21/2017   Procedure: PANCREATIC STENT PLACEMENT;  Surgeon: Clarene Essex, MD;  Location: [facility];  Service: Endoscopy;;   PEG PLACEMENT N/A 09/02/2013   Procedure: PERCUTANEOUS ENDOSCOPIC GASTROSTOMY (PEG) PLACEMENT;  Surgeon: Gwenyth Ober, MD;  Location: [facility];  Service: General;  Laterality: N/A;   PROSTATE CRYOABLATION     REMOVAL OF STONES  08/21/2017   Procedure: REMOVAL OF STONES;  Surgeon: Clarene Essex,  MD;  Location: [facility];  Service: Endoscopy;;   SPHINCTEROTOMY  08/21/2017   Procedure: Joan Mayans;  Surgeon: Clarene Essex, MD;  Location: [facility] END[facility];  Service: Endoscopy;;   Family History  Problem Relation Age of Onset   Lung cancer Father    Hypertension Other    Arthritis Other    Social History   Socioeconomic History   Marital status: Married    Spouse name: Not on file   Number of children: 3   Years of education: 16   Highest education level: Not on file  Occupational History   Occupation: Mortician    Employer: [employer]  Tobacco Use   Smoking status: Former   Smokeless tobacco: Never  Scientific laboratory technician Use: Never used  Substance and Sexual Activity   Alcohol use: Yes    Comment: socially    Drug use: No   Sexual activity: Not Currently    Birth control/protection: None  Other Topics Concern   Not on file  Social History Narrative   ** Merged History Encounter **  Building services engineer. Married 1961. 2 sons- '63, '69 & 1 daughter '55   Grandchildren 5. 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 Working full time. Discussion needed in regard to Advance Care Planning-DNR/DNI, no artificial feeding or hydration, No HD, no heroic or futile.                Social Determinants of Health   Financial Resource Strain: Low Risk  (12/01/2021)   Overall Financial Resource Strain (CARDIA)    Difficulty of Paying Living Expenses: Not hard at all  Food Insecurity: No Food Insecurity (12/01/2021)   Hunger Vital Sign    Worried About Running Out of Food in the Last Year: Never true    Ran Out of Food in the Last Year: Never true  Transportation Needs: No Transportation Needs (12/01/2021)   PRAPARE - Hydrologist (Medical): No    Lack of Transportation (Non-Medical): No  Physical Activity: Sufficiently Active (12/01/2021)   Exercise Vital Sign    Days of Exercise per Week: 7 days    Minutes of Exercise per Session: 40 min   Stress: No Stress Concern Present (12/01/2021)   [place]    Feeling of Stress : Only a little  Social Connections: Moderately Integrated (12/01/2021)   Social Connection and Isolation Panel [NHANES]    Frequency of Communication with Friends and Family: More than three times a week    Frequency of Social Gatherings with Friends and Family: More than three times a week    Attends Religious Services: 1 to 4 times per year    Active Member of [employer] or Organizations: No    Attends Music therapist: Never    Marital Status: Married    Tobacco Counseling Counseling given: Not Answered   Clinical Intake:  Pre-visit preparation completed: Yes  Pain : No/denies pain     Nutritional Status: BMI > 30  Obese Nutritional Risks: None Diabetes: No  How often do you need to have someone help you when you read instructions, pamphlets, or other written materials from your doctor or pharmacy?: 1 - Never What is the last grade level you completed in school?: 12  Diabetic? No     Information entered by :: Emelia Loron RN   Activities of Daily Living    12/01/2021   12:40 PM  In your present state of health, do you have any difficulty performing the following activities:  Hearing? 0  Vision? 0  Difficulty concentrating or making decisions? 0  Walking or climbing stairs? 1  Comment wheelchair bound; family assist  Dressing or bathing? 1  Comment wheelchair bound; family assist  Doing errands, shopping? 1  Comment wheelchair bound; family assist  Preparing Food and eating ? Y  Comment wheelchair bound; family assist  Using the Toilet? Y  Comment wheelchair bound; family assist  In the past six months, have you accidently leaked urine? Y  Do you have problems with loss of bowel control? Y  Managing your Medications? Y  Comment family assist  Comment wheelchair bound; family assist    Patient Care  Team: [name], MD as PCP - General (Internal Medicine) Prentiss Bells, MD (Ophthalmology) [place], Clarene Duke, OT as Occupational Therapist (Occupational Therapy) Lonna Cobb, [place] (Inactive) as Physical Therapist (Physical Therapy) Susy Frizzle, PTA as Physical Therapy Assistant (Physical Therapy) Opthamology, [place] as Consulting Physician (Ophthalmology)  Indicate any recent Medical Services you may have received from other than  [facility] providers in the past year (date may be approximate).     Assessment:   This is a routine wellness examination for [place].  Hearing/Vision screen No results found.  Dietary issues and exercise activities discussed: Current Exercise Habits: Home exercise routine, Type of exercise: Other - see comments (chair exercises), Time (Minutes): 35, Frequency (Times/Week): 7, Weekly Exercise (Minutes/Week): 245, Intensity: Mild, Exercise limited by: orthopedic condition(s);Other - see comments (poor mobility)   Goals Addressed             This Visit's Progress    Patient Stated       Patient states he would like to get a motorized wheelchair; understands to contact PCP to start process.      Depression Screen    12/01/2021   12:58 PM 09/28/2021    2:29 PM 03/23/2021    2:34 PM 06/23/2020    1:39 PM 03/10/2020    2:04 PM 05/19/2019   11:38 AM 02/07/2019    2:45 PM  PHQ 2/9 Scores  PHQ - 2 Score 1  0 0 0 0 0  PHQ- 9 Score 2        Exception Documentation  Patient refusal         Fall Risk    12/01/2021   12:40 PM 09/28/2021    2:29 PM 03/23/2021    2:34 PM 06/23/2020    1:40 PM 03/10/2020    2:04 PM  Fall Risk   Falls in the past year? 1 0 0 0 0  Number falls in past yr: 1  0 0   Injury with Fall? 0 0 0 0   Risk for fall due to : History of fall(s);Impaired balance/gait;Impaired mobility;Impaired vision No Fall Risks  No Fall Risks   Follow up Falls evaluation completed;Education provided Falls evaluation completed  Falls evaluation  completed     [place]:  Any stairs in or around the home? Yes  If so, are there any without handrails? Yes  Home free of loose throw rugs in walkways, pet beds, electrical cords, etc? Yes  Adequate lighting in your home to reduce risk of falls? Yes   ASSISTIVE DEVICES UTILIZED TO PREVENT FALLS:  Life alert? No  Use of a cane, walker or w/c? Yes  Grab bars in the bathroom? Yes  Shower chair or bench in shower? Yes  Elevated toilet seat or a handicapped toilet? Yes   Cognitive Function:        12/01/2021   12:41 PM  6CIT Screen  What Year? 0 points  What month? 0 points  What time? 0 points  Count back from 20 0 points  Months in reverse 4 points  Repeat phrase 2 points  Total Score 6 points    Immunizations Immunization History  Administered Date(s) Administered   Influenza-Unspecified 01/03/2021   Moderna Sars-Covid-2 Vaccination 03/01/2019, 04/01/2019, 01/12/2020, 01/03/2021    TDAP status: Up to date  Flu Vaccine status: Due, Education has been provided regarding the importance of this vaccine. Advised may receive this vaccine at local pharmacy or Health Dept. Aware to provide a copy of the vaccination record if obtained from local pharmacy or Health Dept. Verbalized acceptance and understanding.  Pneumococcal vaccine status: Due, Education has been provided regarding the importance of this vaccine. Advised may receive this vaccine at local pharmacy or Health Dept. Aware to provide a copy of the vaccination record if obtained from local pharmacy or Health Dept. Verbalized acceptance and  understanding.  Covid-19 vaccine status: Information provided on how to obtain vaccines.   Qualifies for Shingles Vaccine? Yes   Zostavax completed No   Shingrix Completed?: No.    Education has been provided regarding the importance of this vaccine. Patient has been advised to call insurance company to determine out of pocket expense if they have  not yet received this vaccine. Advised may also receive vaccine at local pharmacy or Health Dept. Verbalized acceptance and understanding.  Screening Tests Health Maintenance  Topic Date Due   COVID-19 Vaccine (5 - Moderna risk series) 02/28/2021   Zoster Vaccines- Shingrix (1 of 2) 12/28/2021 (Originally 01/22/1952)   Pneumonia Vaccine 14+ Years old (1 - PCV) 03/24/2022 (Originally 01/23/2000)   TETANUS/TDAP  02/04/2022   Medicare Annual Wellness (AWV)  12/02/2022   HPV VACCINES  Aged Out   INFLUENZA VACCINE  Discontinued    Health Maintenance  Health Maintenance Due  Topic Date Due   COVID-19 Vaccine (5 - Moderna risk series) 02/28/2021    Colorectal cancer screening: No longer required.   Lung Cancer Screening: (Low Dose CT Chest recommended if Age 57-80 years, 30 pack-year currently smoking OR have quit w/in 15years.) does not qualify.   Additional Screening:  Hepatitis C Screening: does qualify; Completed provided education  Vision Screening: Recommended annual ophthalmology exams for early detection of glaucoma and other disorders of the eye. Is the patient up to date with their annual eye exam?  Yes  Who is the provider or what is the name of the office in which the patient attends annual eye exams? Dr. Satira Sark  If pt is not established with a provider, would they like to be referred to a provider to establish care?  N/A .   Dental Screening: Recommended annual dental exams for proper oral hygiene  Community Resource Referral / Chronic Care Management: CRR required this visit?  No   CCM required this visit?  No      Plan:     I have personally reviewed and noted the following in the patient's chart:   Medical and social history Use of alcohol, tobacco or illicit drugs  Current medications and supplements including opioid prescriptions. Patient is not currently taking opioid prescriptions. Functional ability and status Nutritional status Physical  activity Advanced directives List of other physicians Hospitalizations, surgeries, and ER visits in previous 12 months Vitals Screenings to include cognitive, depression, and falls Referrals and appointments  In addition, I have reviewed and discussed with patient certain preventive protocols, quality metrics, and best practice recommendations. A written personalized care plan for preventive services as well as general preventive health recommendations were provided to patient.     Michiel Cowboy, RN   12/01/2021   Nurse Notes:  Mr. Boomer , Thank you for taking time to come for your Medicare Wellness Visit. I appreciate your ongoing commitment to your health goals. Please review the following plan we discussed and let me know if I can assist you in the future.   These are the goals we discussed:  Goals      Patient Stated     I would like to walk on my own again without assistance.     Patient Stated     Patient states he would like to get a motorized wheelchair; understands to contact PCP to start process.        This is a list of the screening recommended for you and due dates:  Health Maintenance  Topic Date Due  COVID-19 Vaccine (5 - Moderna risk series) 02/28/2021   Zoster (Shingles) Vaccine (1 of 2) 12/28/2021*   Pneumonia Vaccine (1 - PCV) 03/24/2022*   Tetanus Vaccine  02/04/2022   Medicare Annual Wellness Visit  12/02/2022   HPV Vaccine  Aged Out   Flu Shot  Discontinued  *Topic was postponed. The date shown is not the original due date.

## 2021-11-30 NOTE — Patient Instructions (Signed)
Health Maintenance, Male Adopting a healthy lifestyle and getting preventive care are important in promoting health and wellness. Ask your health care provider about: The right schedule for you to have regular tests and exams. Things you can do on your own to prevent diseases and keep yourself healthy. What should I know about diet, weight, and exercise? Eat a healthy diet  Eat a diet that includes plenty of vegetables, fruits, low-fat dairy products, and lean protein. Do not eat a lot of foods that are high in solid fats, added sugars, or sodium. Maintain a healthy weight Body mass index (BMI) is a measurement that can be used to identify possible weight problems. It estimates body fat based on height and weight. Your health care provider can help determine your BMI and help you achieve or maintain a healthy weight. Get regular exercise Get regular exercise. This is one of the most important things you can do for your health. Most adults should: Exercise for at least 150 minutes each week. The exercise should increase your heart rate and make you sweat (moderate-intensity exercise). Do strengthening exercises at least twice a week. This is in addition to the moderate-intensity exercise. Spend less time sitting. Even light physical activity can be beneficial. Watch cholesterol and blood lipids Have your blood tested for lipids and cholesterol at 86 years of age, then have this test every 5 years. You may need to have your cholesterol levels checked more often if: Your lipid or cholesterol levels are high. You are older than 86 years of age. You are at high risk for heart disease. What should I know about cancer screening? Many types of cancers can be detected early and may often be prevented. Depending on your health history and family history, you may need to have cancer screening at various ages. This may include screening for: Colorectal cancer. Prostate cancer. Skin cancer. Lung  cancer. What should I know about heart disease, diabetes, and high blood pressure? Blood pressure and heart disease High blood pressure causes heart disease and increases the risk of stroke. This is more likely to develop in people who have high blood pressure readings or are overweight. Talk with your health care provider about your target blood pressure readings. Have your blood pressure checked: Every 3-5 years if you are 18-39 years of age. Every year if you are 40 years old or older. If you are between the ages of 65 and 75 and are a current or former smoker, ask your health care provider if you should have a one-time screening for abdominal aortic aneurysm (AAA). Diabetes Have regular diabetes screenings. This checks your fasting blood sugar level. Have the screening done: Once every three years after age 45 if you are at a normal weight and have a low risk for diabetes. More often and at a younger age if you are overweight or have a high risk for diabetes. What should I know about preventing infection? Hepatitis B If you have a higher risk for hepatitis B, you should be screened for this virus. Talk with your health care provider to find out if you are at risk for hepatitis B infection. Hepatitis C Blood testing is recommended for: Everyone born from 1945 through 1965. Anyone with known risk factors for hepatitis C. Sexually transmitted infections (STIs) You should be screened each year for STIs, including gonorrhea and chlamydia, if: You are sexually active and are younger than 86 years of age. You are older than 86 years of age and your   health care provider tells you that you are at risk for this type of infection. Your sexual activity has changed since you were last screened, and you are at increased risk for chlamydia or gonorrhea. Ask your health care provider if you are at risk. Ask your health care provider about whether you are at high risk for HIV. Your health care provider  may recommend a prescription medicine to help prevent HIV infection. If you choose to take medicine to prevent HIV, you should first get tested for HIV. You should then be tested every 3 months for as long as you are taking the medicine. Follow these instructions at home: Alcohol use Do not drink alcohol if your health care provider tells you not to drink. If you drink alcohol: Limit how much you have to 0-2 drinks a day. Know how much alcohol is in your drink. In the [place] one drink equals one 12 oz bottle of beer (355 mL), one 5 oz glass of Karess Harner (148 mL), or one 1 oz glass of hard liquor (44 mL). Lifestyle Do not use any products that contain nicotine or tobacco. These products include cigarettes, chewing tobacco, and vaping devices, such as e-cigarettes. If you need help quitting, ask your health care provider. Do not use street drugs. Do not share needles. Ask your health care provider for help if you need support or information about quitting drugs. General instructions Schedule regular health, dental, and eye exams. Stay current with your vaccines. Tell your health care provider if: You often feel depressed. You have ever been abused or do not feel safe at home. Summary Adopting a healthy lifestyle and getting preventive care are important in promoting health and wellness. Follow your health care provider's instructions about healthy diet, exercising, and getting tested or screened for diseases. Follow your health care provider's instructions on monitoring your cholesterol and blood pressure. This information is not intended to replace advice given to you by your health care provider. Make sure you discuss any questions you have with your health care provider. Document Revised: 05/31/2020 Document Reviewed: 05/31/2020 Elsevier Patient Education  2023 Elsevier Inc.  

## 2021-12-01 ENCOUNTER — Ambulatory Visit (INDEPENDENT_AMBULATORY_CARE_PROVIDER_SITE_OTHER): Payer: Medicare Other | Admitting: *Deleted

## 2021-12-01 DIAGNOSIS — Z0000 Encounter for general adult medical examination without abnormal findings: Secondary | ICD-10-CM

## 2021-12-08 DIAGNOSIS — G8190 Hemiplegia, unspecified affecting unspecified side: Secondary | ICD-10-CM | POA: Diagnosis not present

## 2021-12-08 DIAGNOSIS — L84 Corns and callosities: Secondary | ICD-10-CM | POA: Diagnosis not present

## 2021-12-08 DIAGNOSIS — B351 Tinea unguium: Secondary | ICD-10-CM | POA: Diagnosis not present

## 2021-12-08 DIAGNOSIS — I739 Peripheral vascular disease, unspecified: Secondary | ICD-10-CM | POA: Diagnosis not present

## 2021-12-27 ENCOUNTER — Other Ambulatory Visit: Payer: Self-pay | Admitting: Cardiology

## 2021-12-27 NOTE — Telephone Encounter (Addendum)
Refill request for Warfarin:  Last INR was 2.4 on 11/22/21 Next INR due 01/03/22 LOV was 09/28/21  Marshall Cork MD  Refill approved.

## 2022-01-03 ENCOUNTER — Ambulatory Visit: Payer: Medicare Other | Attending: Cardiology | Admitting: *Deleted

## 2022-01-03 DIAGNOSIS — I4891 Unspecified atrial fibrillation: Secondary | ICD-10-CM

## 2022-01-03 DIAGNOSIS — Z5181 Encounter for therapeutic drug level monitoring: Secondary | ICD-10-CM | POA: Diagnosis not present

## 2022-01-03 LAB — POCT INR: INR: 3.3 — AB (ref 2.0–3.0)

## 2022-01-03 NOTE — Patient Instructions (Signed)
Take warfarin 1 tablet tonight then resume 2 tablets daily except 1 1/2 tablets on Mondays, Wednesdays and Fridays Recheck in 6 weeks in [place] office.

## 2022-01-07 DIAGNOSIS — Z23 Encounter for immunization: Secondary | ICD-10-CM | POA: Diagnosis not present

## 2022-02-14 ENCOUNTER — Ambulatory Visit: Payer: Medicare Other | Attending: Cardiology | Admitting: *Deleted

## 2022-02-14 DIAGNOSIS — I4891 Unspecified atrial fibrillation: Secondary | ICD-10-CM | POA: Diagnosis not present

## 2022-02-14 DIAGNOSIS — Z5181 Encounter for therapeutic drug level monitoring: Secondary | ICD-10-CM | POA: Diagnosis not present

## 2022-02-14 LAB — POCT INR: INR: 3.5 — AB (ref 2.0–3.0)

## 2022-02-14 NOTE — Patient Instructions (Signed)
Hold warfarin tonight then decrease dose to 1 1/2 tablets daily except 2 tablets on Sundays and Thursdays Recheck in 4 weeks in [place] office.

## 2022-02-15 ENCOUNTER — Emergency Department (HOSPITAL_COMMUNITY): Payer: Medicare Other

## 2022-02-15 ENCOUNTER — Inpatient Hospital Stay (HOSPITAL_COMMUNITY)
Admission: EM | Admit: 2022-02-15 | Discharge: 2022-02-19 | DRG: 871 | Disposition: A | Discharge status: Home Health | Payer: Medicare Other | Attending: Internal Medicine | Admitting: Internal Medicine

## 2022-02-15 ENCOUNTER — Other Ambulatory Visit: Payer: Self-pay

## 2022-02-15 DIAGNOSIS — Z7901 Long term (current) use of anticoagulants: Secondary | ICD-10-CM

## 2022-02-15 DIAGNOSIS — R9341 Abnormal radiologic findings on diagnostic imaging of renal pelvis, ureter, or bladder: Secondary | ICD-10-CM | POA: Diagnosis present

## 2022-02-15 DIAGNOSIS — R1110 Vomiting, unspecified: Secondary | ICD-10-CM | POA: Diagnosis not present

## 2022-02-15 DIAGNOSIS — E782 Mixed hyperlipidemia: Secondary | ICD-10-CM | POA: Diagnosis present

## 2022-02-15 DIAGNOSIS — S1193XS Puncture wound without foreign body of unspecified part of neck, sequela: Secondary | ICD-10-CM

## 2022-02-15 DIAGNOSIS — G8194 Hemiplegia, unspecified affecting left nondominant side: Secondary | ICD-10-CM | POA: Diagnosis present

## 2022-02-15 DIAGNOSIS — Z801 Family history of malignant neoplasm of trachea, bronchus and lung: Secondary | ICD-10-CM

## 2022-02-15 DIAGNOSIS — N1832 Chronic kidney disease, stage 3b: Secondary | ICD-10-CM | POA: Diagnosis present

## 2022-02-15 DIAGNOSIS — I4510 Unspecified right bundle-branch block: Secondary | ICD-10-CM | POA: Diagnosis present

## 2022-02-15 DIAGNOSIS — R7302 Impaired glucose tolerance (oral): Secondary | ICD-10-CM | POA: Diagnosis present

## 2022-02-15 DIAGNOSIS — S069X0S Unspecified intracranial injury without loss of consciousness, sequela: Secondary | ICD-10-CM | POA: Diagnosis not present

## 2022-02-15 DIAGNOSIS — Z87891 Personal history of nicotine dependence: Secondary | ICD-10-CM

## 2022-02-15 DIAGNOSIS — R0602 Shortness of breath: Secondary | ICD-10-CM | POA: Diagnosis not present

## 2022-02-15 DIAGNOSIS — R739 Hyperglycemia, unspecified: Secondary | ICD-10-CM | POA: Insufficient documentation

## 2022-02-15 DIAGNOSIS — Z8249 Family history of ischemic heart disease and other diseases of the circulatory system: Secondary | ICD-10-CM

## 2022-02-15 DIAGNOSIS — I700 Atherosclerosis of aorta: Secondary | ICD-10-CM | POA: Diagnosis not present

## 2022-02-15 DIAGNOSIS — J690 Pneumonitis due to inhalation of food and vomit: Secondary | ICD-10-CM | POA: Diagnosis present

## 2022-02-15 DIAGNOSIS — S069XAA Unspecified intracranial injury with loss of consciousness status unknown, initial encounter: Secondary | ICD-10-CM | POA: Diagnosis present

## 2022-02-15 DIAGNOSIS — J189 Pneumonia, unspecified organism: Secondary | ICD-10-CM | POA: Diagnosis present

## 2022-02-15 DIAGNOSIS — T17908A Unspecified foreign body in respiratory tract, part unspecified causing other injury, initial encounter: Secondary | ICD-10-CM | POA: Diagnosis not present

## 2022-02-15 DIAGNOSIS — J9601 Acute respiratory failure with hypoxia: Secondary | ICD-10-CM | POA: Diagnosis present

## 2022-02-15 DIAGNOSIS — A419 Sepsis, unspecified organism: Secondary | ICD-10-CM | POA: Diagnosis not present

## 2022-02-15 DIAGNOSIS — K219 Gastro-esophageal reflux disease without esophagitis: Secondary | ICD-10-CM | POA: Diagnosis present

## 2022-02-15 DIAGNOSIS — Z1152 Encounter for screening for COVID-19: Secondary | ICD-10-CM

## 2022-02-15 DIAGNOSIS — W3400XS Accidental discharge from unspecified firearms or gun, sequela: Secondary | ICD-10-CM

## 2022-02-15 DIAGNOSIS — I480 Paroxysmal atrial fibrillation: Secondary | ICD-10-CM | POA: Diagnosis present

## 2022-02-15 DIAGNOSIS — R0902 Hypoxemia: Secondary | ICD-10-CM | POA: Diagnosis not present

## 2022-02-15 DIAGNOSIS — Z8679 Personal history of other diseases of the circulatory system: Secondary | ICD-10-CM | POA: Diagnosis not present

## 2022-02-15 DIAGNOSIS — Z87820 Personal history of traumatic brain injury: Secondary | ICD-10-CM

## 2022-02-15 DIAGNOSIS — J9602 Acute respiratory failure with hypercapnia: Secondary | ICD-10-CM | POA: Diagnosis present

## 2022-02-15 DIAGNOSIS — Z8546 Personal history of malignant neoplasm of prostate: Secondary | ICD-10-CM

## 2022-02-15 DIAGNOSIS — Z79899 Other long term (current) drug therapy: Secondary | ICD-10-CM

## 2022-02-15 DIAGNOSIS — R109 Unspecified abdominal pain: Secondary | ICD-10-CM | POA: Diagnosis not present

## 2022-02-15 DIAGNOSIS — S069X0D Unspecified intracranial injury without loss of consciousness, subsequent encounter: Secondary | ICD-10-CM | POA: Diagnosis not present

## 2022-02-15 DIAGNOSIS — R000 Tachycardia, unspecified: Secondary | ICD-10-CM | POA: Diagnosis not present

## 2022-02-15 DIAGNOSIS — R1310 Dysphagia, unspecified: Secondary | ICD-10-CM | POA: Diagnosis present

## 2022-02-15 DIAGNOSIS — J45909 Unspecified asthma, uncomplicated: Secondary | ICD-10-CM | POA: Diagnosis present

## 2022-02-15 DIAGNOSIS — K449 Diaphragmatic hernia without obstruction or gangrene: Secondary | ICD-10-CM | POA: Diagnosis present

## 2022-02-15 DIAGNOSIS — Z8719 Personal history of other diseases of the digestive system: Secondary | ICD-10-CM

## 2022-02-15 DIAGNOSIS — Z9049 Acquired absence of other specified parts of digestive tract: Secondary | ICD-10-CM

## 2022-02-15 DIAGNOSIS — I129 Hypertensive chronic kidney disease with stage 1 through stage 4 chronic kidney disease, or unspecified chronic kidney disease: Secondary | ICD-10-CM | POA: Diagnosis not present

## 2022-02-15 DIAGNOSIS — E559 Vitamin D deficiency, unspecified: Secondary | ICD-10-CM | POA: Insufficient documentation

## 2022-02-15 LAB — CBC WITH DIFFERENTIAL/PLATELET
Abs Immature Granulocytes: 0.05 10*3/uL (ref 0.00–0.07)
Basophils Absolute: 0.1 10*3/uL (ref 0.0–0.1)
Basophils Relative: 1 %
Eosinophils Absolute: 0.1 10*3/uL (ref 0.0–0.5)
Eosinophils Relative: 1 %
HCT: 44.4 % (ref 39.0–52.0)
Hemoglobin: 14.5 g/dL (ref 13.0–17.0)
Immature Granulocytes: 1 %
Lymphocytes Relative: 6 %
Lymphs Abs: 0.6 10*3/uL — ABNORMAL LOW (ref 0.7–4.0)
MCH: 31.7 pg (ref 26.0–34.0)
MCHC: 32.7 g/dL (ref 30.0–36.0)
MCV: 97.2 fL (ref 80.0–100.0)
Monocytes Absolute: 0.9 10*3/uL (ref 0.1–1.0)
Monocytes Relative: 8 %
Neutro Abs: 9.3 10*3/uL — ABNORMAL HIGH (ref 1.7–7.7)
Neutrophils Relative %: 83 %
Platelets: 184 10*3/uL (ref 150–400)
RBC: 4.57 MIL/uL (ref 4.22–5.81)
RDW: 13.9 % (ref 11.5–15.5)
WBC: 11 10*3/uL — ABNORMAL HIGH (ref 4.0–10.5)
nRBC: 0 % (ref 0.0–0.2)

## 2022-02-15 LAB — COMPREHENSIVE METABOLIC PANEL
ALT: 18 U/L (ref 0–44)
AST: 22 U/L (ref 15–41)
Albumin: 4.2 g/dL (ref 3.5–5.0)
Alkaline Phosphatase: 56 U/L (ref 38–126)
Anion gap: 14 (ref 5–15)
BUN: 22 mg/dL (ref 8–23)
CO2: 23 mmol/L (ref 22–32)
Calcium: 9.4 mg/dL (ref 8.9–10.3)
Chloride: 104 mmol/L (ref 98–111)
Creatinine, Ser: 1.72 mg/dL — ABNORMAL HIGH (ref 0.61–1.24)
GFR, Estimated: 38 mL/min — ABNORMAL LOW (ref >60)
Glucose, Bld: 182 mg/dL — ABNORMAL HIGH (ref 70–99)
Potassium: 3.9 mmol/L (ref 3.5–5.1)
Sodium: 141 mmol/L (ref 135–145)
Total Bilirubin: 0.7 mg/dL (ref 0.3–1.2)
Total Protein: 8.2 g/dL — ABNORMAL HIGH (ref 6.5–8.1)

## 2022-02-15 LAB — RESP PANEL BY RT-PCR (RSV, FLU A&B, COVID)  RVPGX2
Influenza A by PCR: NEGATIVE
Influenza B by PCR: NEGATIVE
Resp Syncytial Virus by PCR: NEGATIVE
SARS Coronavirus 2 by RT PCR: NEGATIVE

## 2022-02-15 LAB — BRAIN NATRIURETIC PEPTIDE: B Natriuretic Peptide: 27 pg/mL (ref 0.0–100.0)

## 2022-02-15 LAB — BLOOD GAS, VENOUS
Acid-Base Excess: 3.3 mmol/L — ABNORMAL HIGH (ref 0.0–2.0)
Bicarbonate: 29.6 mmol/L — ABNORMAL HIGH (ref 20.0–28.0)
Drawn by: 41910
O2 Saturation: 49.3 %
Patient temperature: 37.2
pCO2, Ven: 50 mmHg (ref 44–60)
pH, Ven: 7.38 (ref 7.25–7.43)
pO2, Ven: 33 mmHg (ref 32–45)

## 2022-02-15 LAB — TROPONIN I (HIGH SENSITIVITY)
Troponin I (High Sensitivity): 5 ng/L (ref <18)
Troponin I (High Sensitivity): 6 ng/L (ref <18)

## 2022-02-15 LAB — PROTIME-INR
INR: 2.2 — ABNORMAL HIGH (ref 0.8–1.2)
Prothrombin Time: 23.8 seconds — ABNORMAL HIGH (ref 11.4–15.2)

## 2022-02-15 LAB — MAGNESIUM: Magnesium: 2.2 mg/dL (ref 1.7–2.4)

## 2022-02-15 MED ORDER — ONDANSETRON HCL 4 MG/2ML IJ SOLN
4.0000 mg | Freq: Once | INTRAMUSCULAR | Status: AC
  Administered 2022-02-15: 4 mg via INTRAVENOUS
  Filled 2022-02-15: qty 2

## 2022-02-15 MED ORDER — SODIUM CHLORIDE 0.9 % IV SOLN
3.0000 g | Freq: Once | INTRAVENOUS | Status: AC
  Administered 2022-02-15: 3 g via INTRAVENOUS
  Filled 2022-02-15: qty 8

## 2022-02-15 MED ORDER — SODIUM CHLORIDE 0.9 % IV SOLN
500.0000 mg | INTRAVENOUS | Status: DC
  Administered 2022-02-15 – 2022-02-18 (×4): 500 mg via INTRAVENOUS
  Filled 2022-02-15 (×4): qty 5

## 2022-02-15 MED ORDER — METOCLOPRAMIDE HCL 5 MG/ML IJ SOLN
10.0000 mg | Freq: Once | INTRAMUSCULAR | Status: AC
  Administered 2022-02-15: 10 mg via INTRAVENOUS
  Filled 2022-02-15: qty 2

## 2022-02-15 NOTE — Progress Notes (Signed)
RT called due to patient vomitting while on BIPAP mask. RN had taken patient off machine and suctioned him out. Upon arrival patient was 89% on room air. This RT placed patient on HFNC at 6 lpm and sats came up to 94%. Patient still seems anxious and is tachypneic but will slow his rate down with coaching. Lung sounds have improved from earlier when placed on BIPAP. Now he has better air movement and fine instead of coarse crackles.
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
## 2022-02-15 NOTE — ED Triage Notes (Signed)
Pt was eating an hour ago and may have choked on food. Pt was 64% when ems arrived and 84 on NRB. A&OX4.

## 2022-02-16 ENCOUNTER — Encounter (HOSPITAL_COMMUNITY): Payer: Self-pay | Admitting: Internal Medicine

## 2022-02-16 DIAGNOSIS — Z8679 Personal history of other diseases of the circulatory system: Secondary | ICD-10-CM

## 2022-02-16 DIAGNOSIS — N1832 Chronic kidney disease, stage 3b: Secondary | ICD-10-CM

## 2022-02-16 DIAGNOSIS — A419 Sepsis, unspecified organism: Secondary | ICD-10-CM | POA: Diagnosis not present

## 2022-02-16 DIAGNOSIS — J9601 Acute respiratory failure with hypoxia: Secondary | ICD-10-CM | POA: Diagnosis not present

## 2022-02-16 DIAGNOSIS — J9602 Acute respiratory failure with hypercapnia: Secondary | ICD-10-CM

## 2022-02-16 DIAGNOSIS — E559 Vitamin D deficiency, unspecified: Secondary | ICD-10-CM | POA: Insufficient documentation

## 2022-02-16 DIAGNOSIS — R739 Hyperglycemia, unspecified: Secondary | ICD-10-CM | POA: Insufficient documentation

## 2022-02-16 DIAGNOSIS — I480 Paroxysmal atrial fibrillation: Secondary | ICD-10-CM

## 2022-02-16 DIAGNOSIS — J690 Pneumonitis due to inhalation of food and vomit: Secondary | ICD-10-CM | POA: Diagnosis not present

## 2022-02-16 DIAGNOSIS — S069X0D Unspecified intracranial injury without loss of consciousness, subsequent encounter: Secondary | ICD-10-CM | POA: Diagnosis not present

## 2022-02-16 LAB — CBC
HCT: 37.9 % — ABNORMAL LOW (ref 39.0–52.0)
Hemoglobin: 12.8 g/dL — ABNORMAL LOW (ref 13.0–17.0)
MCH: 32.2 pg (ref 26.0–34.0)
MCHC: 33.8 g/dL (ref 30.0–36.0)
MCV: 95.5 fL (ref 80.0–100.0)
Platelets: 161 10*3/uL (ref 150–400)
RBC: 3.97 MIL/uL — ABNORMAL LOW (ref 4.22–5.81)
RDW: 13.8 % (ref 11.5–15.5)
WBC: 17.3 10*3/uL — ABNORMAL HIGH (ref 4.0–10.5)
nRBC: 0 % (ref 0.0–0.2)

## 2022-02-16 LAB — COMPREHENSIVE METABOLIC PANEL
ALT: 15 U/L (ref 0–44)
AST: 18 U/L (ref 15–41)
Albumin: 3.4 g/dL — ABNORMAL LOW (ref 3.5–5.0)
Alkaline Phosphatase: 43 U/L (ref 38–126)
Anion gap: 8 (ref 5–15)
BUN: 24 mg/dL — ABNORMAL HIGH (ref 8–23)
CO2: 24 mmol/L (ref 22–32)
Calcium: 8.8 mg/dL — ABNORMAL LOW (ref 8.9–10.3)
Chloride: 108 mmol/L (ref 98–111)
Creatinine, Ser: 1.8 mg/dL — ABNORMAL HIGH (ref 0.61–1.24)
GFR, Estimated: 36 mL/min — ABNORMAL LOW (ref >60)
Glucose, Bld: 138 mg/dL — ABNORMAL HIGH (ref 70–99)
Potassium: 4 mmol/L (ref 3.5–5.1)
Sodium: 140 mmol/L (ref 135–145)
Total Bilirubin: 1 mg/dL (ref 0.3–1.2)
Total Protein: 6.7 g/dL (ref 6.5–8.1)

## 2022-02-16 LAB — PROCALCITONIN: Procalcitonin: 35.74 ng/mL

## 2022-02-16 LAB — GLUCOSE, CAPILLARY
Glucose-Capillary: 143 mg/dL — ABNORMAL HIGH (ref 70–99)
Glucose-Capillary: 135 mg/dL — ABNORMAL HIGH (ref 70–99)

## 2022-02-16 LAB — HEMOGLOBIN A1C
Hgb A1c MFr Bld: 5.8 % — ABNORMAL HIGH (ref 4.8–5.6)
Mean Plasma Glucose: 119.76 mg/dL

## 2022-02-16 LAB — PHOSPHORUS: Phosphorus: 2.7 mg/dL (ref 2.5–4.6)

## 2022-02-16 LAB — MAGNESIUM: Magnesium: 1.8 mg/dL (ref 1.7–2.4)

## 2022-02-16 LAB — STREP PNEUMONIAE URINARY ANTIGEN: Strep Pneumo Urinary Antigen: NEGATIVE

## 2022-02-16 LAB — MRSA NEXT GEN BY PCR, NASAL: MRSA by PCR Next Gen: NOT DETECTED

## 2022-02-16 MED ORDER — SODIUM CHLORIDE 0.9 % IV SOLN: INTRAVENOUS | Status: AC

## 2022-02-16 MED ORDER — ACETAMINOPHEN 325 MG PO TABS: 650.0000 mg | ORAL_TABLET | Freq: Four times a day (QID) | ORAL | Status: DC | PRN

## 2022-02-16 MED ORDER — SODIUM CHLORIDE 0.9 % IV SOLN
3.0000 g | Freq: Three times a day (TID) | INTRAVENOUS | Status: DC
  Administered 2022-02-16 – 2022-02-19 (×10): 3 g via INTRAVENOUS
  Filled 2022-02-16 (×14): qty 8

## 2022-02-16 MED ORDER — WARFARIN SODIUM 5 MG PO TABS: 5.0000 mg | ORAL_TABLET | Freq: Every day | ORAL | Status: DC

## 2022-02-16 MED ORDER — DM-GUAIFENESIN ER 30-600 MG PO TB12
1.0000 | ORAL_TABLET | Freq: Two times a day (BID) | ORAL | Status: DC
  Administered 2022-02-16 – 2022-02-19 (×6): 1 via ORAL
  Filled 2022-02-16 (×7): qty 1

## 2022-02-16 MED ORDER — ROSUVASTATIN CALCIUM 20 MG PO TABS
10.0000 mg | ORAL_TABLET | Freq: Every day | ORAL | Status: DC
  Administered 2022-02-16 – 2022-02-19 (×4): 10 mg via ORAL
  Filled 2022-02-16 (×4): qty 1

## 2022-02-16 MED ORDER — WARFARIN - PHARMACIST DOSING INPATIENT: Freq: Every day | Status: DC

## 2022-02-16 MED ORDER — CHLORHEXIDINE GLUCONATE CLOTH 2 % EX PADS
6.0000 | MEDICATED_PAD | Freq: Every day | CUTANEOUS | Status: DC
  Administered 2022-02-16 – 2022-02-19 (×4): 6 via TOPICAL

## 2022-02-16 MED ORDER — INSULIN ASPART 100 UNIT/ML IJ SOLN
0.0000 [IU] | INTRAMUSCULAR | Status: DC
  Administered 2022-02-16 (×2): 1 [IU] via SUBCUTANEOUS

## 2022-02-16 MED ORDER — ONDANSETRON HCL 4 MG PO TABS: 4.0000 mg | ORAL_TABLET | Freq: Four times a day (QID) | ORAL | Status: DC | PRN

## 2022-02-16 MED ORDER — ONDANSETRON HCL 4 MG/2ML IJ SOLN: 4.0000 mg | Freq: Four times a day (QID) | INTRAMUSCULAR | Status: DC | PRN

## 2022-02-16 MED ORDER — WARFARIN SODIUM 5 MG PO TABS
10.0000 mg | ORAL_TABLET | Freq: Once | ORAL | Status: AC
  Administered 2022-02-16: 10 mg via ORAL
  Filled 2022-02-16: qty 2

## 2022-02-16 NOTE — Plan of Care (Signed)
  Problem: Acute Rehab OT Goals (only OT should resolve) Goal: Pt. Will Perform Grooming Flowsheets (Taken 02/16/2022 1002) Pt Will Perform Grooming:  with set-up  sitting Goal: Pt. Will Transfer To Toilet Flowsheets (Taken 02/16/2022 1002) Pt Will Transfer to Toilet:  with supervision  stand pivot transfer  with min guard assist Goal: Pt/Caregiver Will Perform Home Exercise Program Flowsheets (Taken 02/16/2022 1002) Pt/caregiver will Perform Home Exercise Program:  Increased ROM  Increased strength  Right Upper extremity  Independently  Yariela Tison OT, MOT
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
## 2022-02-16 NOTE — TOC Progression Note (Signed)
Transition of Care [facility]) - Progression Note    Patient Details  Name: PURL CLAYTOR MRN: [id number] Date of Birth: 09/07/1932  Transition of Care [facility]) CM/SW Contact  Salome Arnt, [place] Phone Number: 02/16/2022, 8:56 AM  Clinical Narrative:  Transition of Care [facility]) Screening Note   Patient Details  Name: KREIG PARSON Date of Birth: 21-May-1932   Transition of Care [facility]) CM/SW Contact:    Salome Arnt, [place] Phone Number: 02/16/2022, 8:56 AM    Transition of Care Department [facility]) has reviewed patient and no TOC needs have been identified at this time. We will continue to monitor patient advancement through interdisciplinary progression rounds. If new patient transition needs arise, please place a TOC consult.             Expected Discharge Plan and Services                                               Social Determinants of Health (SDOH) Interventions SDOH Screenings   Food Insecurity: No Food Insecurity (02/16/2022)  Housing: Low Risk  (02/16/2022)  Transportation Needs: No Transportation Needs (02/16/2022)  Utilities: Not At Risk (02/16/2022)  Alcohol Screen: Low Risk  (12/01/2021)  Depression (PHQ2-9): Low Risk  (12/01/2021)  Financial Resource Strain: Low Risk  (12/01/2021)  Physical Activity: Sufficiently Active (12/01/2021)  Social Connections: Moderately Integrated (12/01/2021)  Stress: No Stress Concern Present (12/01/2021)  Tobacco Use: Medium Risk (02/16/2022)    Readmission Risk Interventions     No data to display

## 2022-02-16 NOTE — Plan of Care (Signed)
  Problem: Acute Rehab PT Goals(only PT should resolve) Goal: Pt Will Go Supine/Side To Sit Outcome: Progressing Flowsheets (Taken 02/16/2022 1214) Pt will go Supine/Side to Sit:  with minimal assist  with moderate assist Goal: Patient Will Transfer Sit To/From Stand Outcome: Progressing Flowsheets (Taken 02/16/2022 1214) Patient will transfer sit to/from stand:  with minimal assist  with moderate assist Goal: Pt Will Transfer Bed To Chair/Chair To Bed Outcome: Progressing Flowsheets (Taken 02/16/2022 1214) Pt will Transfer Bed to Chair/Chair to Bed:  with min assist  with mod assist Goal: Pt Will Ambulate Outcome: Progressing Flowsheets (Taken 02/16/2022 1214) Pt will Ambulate:  15 feet  with minimal assist  with moderate assist  with other equipment (comment) Note: Hemi-Walker   12:15 PM, 02/16/22 Lonell Grandchild, MPT Physical Therapist with [facility] [phone] office [phone] mobile phone

## 2022-02-16 NOTE — Progress Notes (Addendum)
ANTICOAGULATION CONSULT NOTE - Initial Consult  Pharmacy Consult for warfarin Indication: atrial fibrillation  No Known Allergies  Patient Measurements: Height: 5' 6.5" (168.9 cm) Weight: 84.8 kg (186 lb 15.2 oz) IBW/kg (Calculated) : 64.95 Heparin Dosing Weight:   Vital Signs: Temp: 98.8 F (37.1 C) (01/25 0740) Temp Source: Oral (01/25 0740) BP: 109/46 (01/25 0800) Pulse Rate: 81 (01/25 0800)  Labs: Recent Labs    02/14/22 1440 02/15/22 2008 02/15/22 2151 02/15/22 2208 02/16/22 0424  HGB  --  14.5  --   --  12.8*  HCT  --  44.4  --   --  37.9*  PLT  --  184  --   --  161  LABPROT  --   --   --  23.8*  --   INR 3.5*  --   --  2.2*  --   CREATININE  --  1.72*  --   --  1.80*  TROPONINIHS  --  5 6  --   --     Estimated Creatinine Clearance: 29.8 mL/min (A) (by C-G formula based on SCr of 1.8 mg/dL (H)).   Medical History: Past Medical History:  Diagnosis Date   ADENOCARCINOMA, PROSTATE, GLEASON GRADE 5 01/21/2009   ALLERGIC RHINITIS 01/14/2007   Bilateral inguinal hernia    Cholecystitis    Cholelithiasis    CKD (chronic kidney disease) stage 3, GFR 30-59 ml/min (HCC) 04/30/2015   COLONIC POLYPS, HX OF 01/14/2007   ELEVATED PROSTATE SPECIFIC ANTIGEN 03/27/2008   ESOPHAGITIS 01/14/2007   GERD (gastroesophageal reflux disease)    HYPERLIPIDEMIA 01/14/2007   HYPERTENSION 01/14/2007   LIBIDO, DECREASED 01/15/2007   PAF (paroxysmal atrial fibrillation) ([place])    Paralysis ([place])    from [place] 02/2013    Medications:    Assessment: Pharmacy consulted to dose warfarin in patient with atrial fibrillation. Home dose listed as 10 mg every Sun + Thurs and 7.5 mg ROW. INR on admission 2.2 with last dose 1/23.  CBC WNL  Goal of Therapy:  INR 2-3 Monitor platelets by anticoagulation protocol: Yes   Plan:  Warfarin 10 mg x 1 dose. Monitor daily INR and s/s of bleeding.  Margot Ables, PharmD Clinical Pharmacist 02/16/2022 8:15 AM
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
## 2022-02-17 DIAGNOSIS — A419 Sepsis, unspecified organism: Secondary | ICD-10-CM | POA: Diagnosis not present

## 2022-02-17 DIAGNOSIS — T17908A Unspecified foreign body in respiratory tract, part unspecified causing other injury, initial encounter: Secondary | ICD-10-CM

## 2022-02-17 DIAGNOSIS — J189 Pneumonia, unspecified organism: Secondary | ICD-10-CM | POA: Diagnosis not present

## 2022-02-17 DIAGNOSIS — J9601 Acute respiratory failure with hypoxia: Secondary | ICD-10-CM | POA: Diagnosis not present

## 2022-02-17 LAB — PROTIME-INR
INR: 2.3 — ABNORMAL HIGH (ref 0.8–1.2)
Prothrombin Time: 24.7 seconds — ABNORMAL HIGH (ref 11.4–15.2)

## 2022-02-17 LAB — MAGNESIUM: Magnesium: 2.1 mg/dL (ref 1.7–2.4)

## 2022-02-17 LAB — BASIC METABOLIC PANEL
Anion gap: 7 (ref 5–15)
BUN: 22 mg/dL (ref 8–23)
CO2: 22 mmol/L (ref 22–32)
Calcium: 8.5 mg/dL — ABNORMAL LOW (ref 8.9–10.3)
Chloride: 110 mmol/L (ref 98–111)
Creatinine, Ser: 1.6 mg/dL — ABNORMAL HIGH (ref 0.61–1.24)
GFR, Estimated: 41 mL/min — ABNORMAL LOW (ref >60)
Glucose, Bld: 87 mg/dL (ref 70–99)
Potassium: 3.8 mmol/L (ref 3.5–5.1)
Sodium: 139 mmol/L (ref 135–145)

## 2022-02-17 LAB — CBC
HCT: 34.9 % — ABNORMAL LOW (ref 39.0–52.0)
Hemoglobin: 11.2 g/dL — ABNORMAL LOW (ref 13.0–17.0)
MCH: 31.9 pg (ref 26.0–34.0)
MCHC: 32.1 g/dL (ref 30.0–36.0)
MCV: 99.4 fL (ref 80.0–100.0)
Platelets: 125 10*3/uL — ABNORMAL LOW (ref 150–400)
RBC: 3.51 MIL/uL — ABNORMAL LOW (ref 4.22–5.81)
RDW: 13.9 % (ref 11.5–15.5)
WBC: 11.7 10*3/uL — ABNORMAL HIGH (ref 4.0–10.5)
nRBC: 0 % (ref 0.0–0.2)

## 2022-02-17 LAB — PHOSPHORUS: Phosphorus: 2.3 mg/dL — ABNORMAL LOW (ref 2.5–4.6)

## 2022-02-17 LAB — PROCALCITONIN: Procalcitonin: 36.69 ng/mL

## 2022-02-17 LAB — LEGIONELLA PNEUMOPHILA SEROGP 1 UR AG: L. pneumophila Serogp 1 Ur Ag: NEGATIVE

## 2022-02-17 MED ORDER — K PHOS MONO-SOD PHOS DI & MONO 155-852-130 MG PO TABS
500.0000 mg | ORAL_TABLET | Freq: Two times a day (BID) | ORAL | Status: DC
  Administered 2022-02-17 – 2022-02-18 (×3): 500 mg via ORAL
  Filled 2022-02-17 (×4): qty 2

## 2022-02-17 MED ORDER — AMLODIPINE BESYLATE 5 MG PO TABS
5.0000 mg | ORAL_TABLET | Freq: Every day | ORAL | Status: DC
  Administered 2022-02-17 – 2022-02-19 (×3): 5 mg via ORAL
  Filled 2022-02-17 (×3): qty 1

## 2022-02-17 MED ORDER — WARFARIN SODIUM 7.5 MG PO TABS
7.5000 mg | ORAL_TABLET | Freq: Once | ORAL | Status: AC
  Administered 2022-02-17: 7.5 mg via ORAL
  Filled 2022-02-17: qty 1

## 2022-02-17 NOTE — TOC Progression Note (Signed)
Transition of Care [facility]) - Progression Note    Patient Details  Name: Austin [name] MRN: [id number] Date of Birth: 13-Apr-1932  Transition of [facility] CM/SW Contact  Boneta Lucks, RN Phone Number: 02/17/2022, 11:18 AM  Clinical Narrative:   [facility] needs physical address for HHPT/OT.  MD aware to order.  Possibly discharging Saturday. Home address  is [address], [place]. Given to [facility].   Expected Discharge Plan: [place] Barriers to Discharge: Continued Medical Work up  Expected Discharge Plan and Services In-house Referral: Clinical Social Work   Post Acute Care Choice: [place] arrangements for the past 2 months: [place]: PT, OT [place] Agency: [place] ([facility]) Date HH Agency Contacted: 02/16/22 Time [place]: [place] Representative spoke with at [place]: [place]) Interventions [place]: No Food Insecurity (02/16/2022)  Housing: Low Risk  (02/16/2022)  Transportation Needs: No Transportation Needs (02/16/2022)  Utilities: Not At Risk (02/16/2022)  Alcohol Screen: Low Risk  (12/01/2021)  Depression (PHQ2-9): Low Risk  (12/01/2021)  Financial Resource Strain: Low Risk  (12/01/2021)  Physical Activity: Sufficiently Active (12/01/2021)  Social Connections: Moderately Integrated (12/01/2021)  Stress: No Stress Concern Present (12/01/2021)  Tobacco Use: Medium Risk (02/16/2022)   Readmission Risk Interventions     No data to display
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
## 2022-02-17 NOTE — Progress Notes (Signed)
   02/17/22 1814  Vitals  Temp 97.9 F (36.6 C)  Temp Source Oral  BP (!) 193/85  MAP (mmHg) 117  BP Method Automatic  Pulse Rate 74  Pulse Rate Source Monitor  MEWS COLOR  MEWS Score Color Green  Oxygen Therapy  SpO2 97 %  MEWS Score  MEWS Temp 0  MEWS Systolic 0  MEWS Pulse 0  MEWS RR 0  MEWS LOC 0  MEWS Score 0
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
## 2022-02-17 NOTE — Progress Notes (Signed)
Austin [name] for warfarin Indication: atrial fibrillation  No Known Allergies  Patient Measurements: Height: 5' 6.5" (168.9 cm) Weight: 87.9 kg (193 lb 12.6 oz) IBW/kg (Calculated) : 64.95 Heparin Dosing Weight:   Vital Signs: Temp: 98.4 F (36.9 C) (01/26 0344) Temp Source: Oral (01/26 0344) BP: 148/119 (01/26 0700) Pulse Rate: 74 (01/26 0700)  Labs: Recent Labs    02/14/22 1440 02/15/22 2008 02/15/22 2008 02/15/22 2151 02/15/22 2208 02/16/22 0424 02/17/22 0419  HGB  --  14.5   < >  --   --  12.8* 11.2*  HCT  --  44.4  --   --   --  37.9* 34.9*  PLT  --  184  --   --   --  161 125*  LABPROT  --   --   --   --  23.8*  --  24.7*  INR 3.5*  --   --   --  2.2*  --  2.3*  CREATININE  --  1.72*  --   --   --  1.80* 1.60*  TROPONINIHS  --  5  --  6  --   --   --    < > = values in this interval not displayed.     Estimated Creatinine Clearance: 34.1 mL/min (A) (by C-G formula based on SCr of 1.6 mg/dL (H)).   Medical History: Past Medical History:  Diagnosis Date   ADENOCARCINOMA, PROSTATE, GLEASON GRADE 5 01/21/2009   ALLERGIC RHINITIS 01/14/2007   Bilateral inguinal hernia    Cholecystitis    Cholelithiasis    CKD (chronic kidney disease) stage 3, GFR 30-59 ml/min (HCC) 04/30/2015   COLONIC POLYPS, HX OF 01/14/2007   ELEVATED PROSTATE SPECIFIC ANTIGEN 03/27/2008   ESOPHAGITIS 01/14/2007   GERD (gastroesophageal reflux disease)    HYPERLIPIDEMIA 01/14/2007   HYPERTENSION 01/14/2007   LIBIDO, DECREASED 01/15/2007   PAF (paroxysmal atrial fibrillation) ([place])    Paralysis ([place])    from [place] 02/2013   Assessment: Pharmacy consulted to dose warfarin in patient with atrial fibrillation. Home dose listed as 10 mg every Sun + Thurs and 7.5 mg ROW. INR on admission 2.2 with last dose 1/23.  INR 2.3 which is stable. Hemoglobin continues to trend down to 11.2, platelet count also down 125. No bleeding issues noted.   Goal of Therapy:   INR 2-3 Monitor platelets by anticoagulation protocol: Yes   Plan:  Warfarin 7.'5mg'$  once today Monitor daily INR and s/s of bleeding.  Erin Hearing PharmD., BCPS Clinical Pharmacist 02/17/2022 8:53 AM

## 2022-02-17 NOTE — Care Management Important Message (Signed)
Important Message  Patient Details  Name: Austin [name] MRN: [id number] Date of Birth: 1932/03/22   Medicare Important Message Given:  Yes     Tommy Medal 02/17/2022, 11:38 AM

## 2022-02-18 DIAGNOSIS — N1832 Chronic kidney disease, stage 3b: Secondary | ICD-10-CM | POA: Diagnosis not present

## 2022-02-18 DIAGNOSIS — J9601 Acute respiratory failure with hypoxia: Secondary | ICD-10-CM | POA: Diagnosis not present

## 2022-02-18 DIAGNOSIS — A419 Sepsis, unspecified organism: Secondary | ICD-10-CM | POA: Diagnosis not present

## 2022-02-18 DIAGNOSIS — J690 Pneumonitis due to inhalation of food and vomit: Secondary | ICD-10-CM | POA: Diagnosis not present

## 2022-02-18 LAB — BASIC METABOLIC PANEL
Anion gap: 7 (ref 5–15)
BUN: 19 mg/dL (ref 8–23)
CO2: 22 mmol/L (ref 22–32)
Calcium: 8.6 mg/dL — ABNORMAL LOW (ref 8.9–10.3)
Chloride: 110 mmol/L (ref 98–111)
Creatinine, Ser: 1.52 mg/dL — ABNORMAL HIGH (ref 0.61–1.24)
GFR, Estimated: 44 mL/min — ABNORMAL LOW (ref >60)
Glucose, Bld: 87 mg/dL (ref 70–99)
Potassium: 4 mmol/L (ref 3.5–5.1)
Sodium: 139 mmol/L (ref 135–145)

## 2022-02-18 LAB — CBC
HCT: 33.9 % — ABNORMAL LOW (ref 39.0–52.0)
Hemoglobin: 11.1 g/dL — ABNORMAL LOW (ref 13.0–17.0)
MCH: 31.7 pg (ref 26.0–34.0)
MCHC: 32.7 g/dL (ref 30.0–36.0)
MCV: 96.9 fL (ref 80.0–100.0)
Platelets: 142 10*3/uL — ABNORMAL LOW (ref 150–400)
RBC: 3.5 MIL/uL — ABNORMAL LOW (ref 4.22–5.81)
RDW: 13.5 % (ref 11.5–15.5)
WBC: 7.9 10*3/uL (ref 4.0–10.5)
nRBC: 0 % (ref 0.0–0.2)

## 2022-02-18 LAB — MAGNESIUM: Magnesium: 2 mg/dL (ref 1.7–2.4)

## 2022-02-18 LAB — GLUCOSE, CAPILLARY: Glucose-Capillary: 114 mg/dL — ABNORMAL HIGH (ref 70–99)

## 2022-02-18 LAB — PHOSPHORUS: Phosphorus: 2.9 mg/dL (ref 2.5–4.6)

## 2022-02-18 LAB — PROTIME-INR
INR: 2.1 — ABNORMAL HIGH (ref 0.8–1.2)
Prothrombin Time: 23.2 seconds — ABNORMAL HIGH (ref 11.4–15.2)

## 2022-02-18 MED ORDER — WARFARIN SODIUM 5 MG PO TABS: 10.0000 mg | ORAL_TABLET | ORAL | Status: DC

## 2022-02-18 MED ORDER — FUROSEMIDE 10 MG/ML IJ SOLN
40.0000 mg | Freq: Once | INTRAMUSCULAR | Status: AC
  Administered 2022-02-18: 40 mg via INTRAVENOUS
  Filled 2022-02-18: qty 4

## 2022-02-18 MED ORDER — WARFARIN SODIUM 7.5 MG PO TABS
7.5000 mg | ORAL_TABLET | ORAL | Status: AC
  Administered 2022-02-18: 7.5 mg via ORAL
  Filled 2022-02-18: qty 1

## 2022-02-18 NOTE — Progress Notes (Signed)
Patient rested well during this shift. No complaints of pain during this shift. He is on room air.
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
## 2022-02-18 NOTE — Progress Notes (Signed)
ANTICOAGULATION CONSULT NOTE   Pharmacy Consult for warfarin Indication: atrial fibrillation  No Known Allergies  Patient Measurements: Height: 5' 6.5" (168.9 cm) Weight: 87.9 kg (193 lb 12.6 oz) IBW/kg (Calculated) : 64.95 Heparin Dosing Weight:   Vital Signs: Temp: 98.3 F (36.8 C) (01/27 0556) BP: 124/58 (01/27 0849) Pulse Rate: 71 (01/27 0556)  Labs: Recent Labs    02/15/22 2008 02/15/22 2151 02/15/22 2208 02/16/22 0424 02/17/22 0419 02/18/22 0540  HGB 14.5  --   --  12.8* 11.2* 11.1*  HCT 44.4  --   --  37.9* 34.9* 33.9*  PLT 184  --   --  161 125* 142*  LABPROT  --   --  23.8*  --  24.7* 23.2*  INR  --   --  2.2*  --  2.3* 2.1*  CREATININE 1.72*  --   --  1.80* 1.60* 1.52*  TROPONINIHS 5 6  --   --   --   --      Estimated Creatinine Clearance: 35.9 mL/min (A) (by C-G formula based on SCr of 1.52 mg/dL (H)).  Assessment: Pharmacy consulted to dose warfarin in patient with atrial fibrillation. Home dose listed as 10 mg every Sun + Thurs and 7.5 mg ROW. INR on admission 2.2 with last dose 1/23.  Today: INR remains stable and therapeutic, no adverse events noted. Will resume patients home regimen   Goal of Therapy:  INR 2-3 Monitor platelets by anticoagulation protocol: Yes   Plan:  Warfarin '10mg'$  Thu and Sun, 7.'5mg'$  rest of the week Monitor INR MWF  Lorenso Courier, PharmD Clinical Pharmacist 02/18/2022 10:26 AM

## 2022-02-19 DIAGNOSIS — J9601 Acute respiratory failure with hypoxia: Secondary | ICD-10-CM | POA: Diagnosis not present

## 2022-02-19 DIAGNOSIS — N1832 Chronic kidney disease, stage 3b: Secondary | ICD-10-CM | POA: Diagnosis not present

## 2022-02-19 DIAGNOSIS — J690 Pneumonitis due to inhalation of food and vomit: Secondary | ICD-10-CM | POA: Diagnosis not present

## 2022-02-19 DIAGNOSIS — J9602 Acute respiratory failure with hypercapnia: Secondary | ICD-10-CM | POA: Diagnosis not present

## 2022-02-19 MED ORDER — AMOXICILLIN-POT CLAVULANATE 875-125 MG PO TABS
1.0000 | ORAL_TABLET | Freq: Two times a day (BID) | ORAL | Status: DC
  Administered 2022-02-19: 1 via ORAL
  Filled 2022-02-19: qty 1

## 2022-02-19 MED ORDER — AMLODIPINE BESYLATE 5 MG PO TABS: 5.0000 mg | ORAL_TABLET | Freq: Every day | ORAL | 1 refills | Status: DC

## 2022-02-19 MED ORDER — AMOXICILLIN-POT CLAVULANATE 875-125 MG PO TABS: 1.0000 | ORAL_TABLET | Freq: Two times a day (BID) | ORAL | 0 refills | Status: DC

## 2022-02-19 MED ORDER — AZITHROMYCIN 250 MG PO TABS: 500.0000 mg | ORAL_TABLET | ORAL | Status: DC

## 2022-02-19 MED ORDER — AZITHROMYCIN 500 MG PO TABS: 500.0000 mg | ORAL_TABLET | Freq: Every day | ORAL | 0 refills | Status: DC
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
## 2022-02-20 DIAGNOSIS — E559 Vitamin D deficiency, unspecified: Secondary | ICD-10-CM | POA: Diagnosis not present

## 2022-02-20 DIAGNOSIS — I480 Paroxysmal atrial fibrillation: Secondary | ICD-10-CM | POA: Diagnosis not present

## 2022-02-20 DIAGNOSIS — I129 Hypertensive chronic kidney disease with stage 1 through stage 4 chronic kidney disease, or unspecified chronic kidney disease: Secondary | ICD-10-CM | POA: Diagnosis not present

## 2022-02-20 DIAGNOSIS — G8191 Hemiplegia, unspecified affecting right dominant side: Secondary | ICD-10-CM | POA: Diagnosis not present

## 2022-02-20 DIAGNOSIS — S069XAD Unspecified intracranial injury with loss of consciousness status unknown, subsequent encounter: Secondary | ICD-10-CM | POA: Diagnosis not present

## 2022-02-20 DIAGNOSIS — J690 Pneumonitis due to inhalation of food and vomit: Secondary | ICD-10-CM | POA: Diagnosis not present

## 2022-02-20 DIAGNOSIS — J9622 Acute and chronic respiratory failure with hypercapnia: Secondary | ICD-10-CM | POA: Diagnosis not present

## 2022-02-20 DIAGNOSIS — E785 Hyperlipidemia, unspecified: Secondary | ICD-10-CM | POA: Diagnosis not present

## 2022-02-20 DIAGNOSIS — N1832 Chronic kidney disease, stage 3b: Secondary | ICD-10-CM | POA: Diagnosis not present

## 2022-02-20 DIAGNOSIS — K2100 Gastro-esophageal reflux disease with esophagitis, without bleeding: Secondary | ICD-10-CM | POA: Diagnosis not present

## 2022-02-20 DIAGNOSIS — Z7901 Long term (current) use of anticoagulants: Secondary | ICD-10-CM | POA: Diagnosis not present

## 2022-02-20 DIAGNOSIS — C61 Malignant neoplasm of prostate: Secondary | ICD-10-CM | POA: Diagnosis not present

## 2022-02-20 DIAGNOSIS — R7301 Impaired fasting glucose: Secondary | ICD-10-CM | POA: Diagnosis not present

## 2022-02-20 DIAGNOSIS — J9621 Acute and chronic respiratory failure with hypoxia: Secondary | ICD-10-CM | POA: Diagnosis not present

## 2022-02-20 DIAGNOSIS — A419 Sepsis, unspecified organism: Secondary | ICD-10-CM | POA: Diagnosis not present

## 2022-02-21 ENCOUNTER — Telehealth: Payer: Self-pay | Admitting: *Deleted

## 2022-02-21 ENCOUNTER — Encounter: Payer: Self-pay | Admitting: *Deleted

## 2022-02-21 DIAGNOSIS — J9621 Acute and chronic respiratory failure with hypoxia: Secondary | ICD-10-CM | POA: Diagnosis not present

## 2022-02-21 DIAGNOSIS — G8191 Hemiplegia, unspecified affecting right dominant side: Secondary | ICD-10-CM | POA: Diagnosis not present

## 2022-02-21 DIAGNOSIS — J690 Pneumonitis due to inhalation of food and vomit: Secondary | ICD-10-CM | POA: Diagnosis not present

## 2022-02-21 DIAGNOSIS — A419 Sepsis, unspecified organism: Secondary | ICD-10-CM | POA: Diagnosis not present

## 2022-02-21 DIAGNOSIS — J9622 Acute and chronic respiratory failure with hypercapnia: Secondary | ICD-10-CM | POA: Diagnosis not present

## 2022-02-21 DIAGNOSIS — I480 Paroxysmal atrial fibrillation: Secondary | ICD-10-CM | POA: Diagnosis not present

## 2022-02-21 LAB — CULTURE, BLOOD (ROUTINE X 2)
Culture: NO GROWTH
Culture: NO GROWTH
Special Requests: ADEQUATE

## 2022-02-21 NOTE — Patient Outreach (Signed)
  Care Coordination [facility] Note Transition Care Management Unsuccessful Follow-up Telephone Call  Date of discharge and from where:  Sunday, 02/19/22, [facility]; acute respiratory failure with hypoxia/ hypercarbia; sepsis pneumonia  Attempts:  1st Attempt  Reason for unsuccessful TCM follow-up call:  Unable to leave message  Received automated outgoing voice message stating, "your call cannot be completed at this time; please hang up and try your call again later" unable to leave voice message requesting callback   Oneta Rack, RN, BSN, CCRN Alumnus RN CM Care Coordination/ Transition of [place] Management [phone]: direct office

## 2022-02-22 ENCOUNTER — Telehealth: Payer: Self-pay | Admitting: *Deleted

## 2022-02-22 ENCOUNTER — Encounter: Payer: Self-pay | Admitting: *Deleted

## 2022-02-22 NOTE — Patient Outreach (Signed)
  Care Coordination [facility] Note Transition Care Management Unsuccessful Follow-up Telephone Call  Date of discharge and from where:  Sunday, 02/19/22, [facility]; acute respiratory failure with hypoxia/ hypercarbia; sepsis pneumonia   Attempts:  2nd Attempt  Reason for unsuccessful TCM follow-up call:  Left voice message  Oneta Rack, RN, BSN, CCRN Alumnus RN CM Care Coordination/ Transition of [place] Management [phone]: direct office

## 2022-02-23 ENCOUNTER — Telehealth: Payer: Self-pay | Admitting: *Deleted

## 2022-02-23 ENCOUNTER — Encounter: Payer: Self-pay | Admitting: *Deleted

## 2022-02-23 DIAGNOSIS — G8190 Hemiplegia, unspecified affecting unspecified side: Secondary | ICD-10-CM | POA: Diagnosis not present

## 2022-02-23 DIAGNOSIS — L84 Corns and callosities: Secondary | ICD-10-CM | POA: Diagnosis not present

## 2022-02-23 DIAGNOSIS — B351 Tinea unguium: Secondary | ICD-10-CM | POA: Diagnosis not present

## 2022-02-23 DIAGNOSIS — I739 Peripheral vascular disease, unspecified: Secondary | ICD-10-CM | POA: Diagnosis not present

## 2022-02-23 NOTE — Progress Notes (Signed)
  Care Coordination  Note  02/23/2022 Name: Austin [name] MRN: [id number] DOB: 1932-12-04  Austin [name] is a 89 y.o. year old primary care patient of Mil[place], Does[place], MD.  Follow up plan: Hospital Follow Up appointment scheduled with (Dr Jenny Reichmann) on (02/24/2022) at (340pm).  Julian Hy, [place] Direct Dial: [phone]

## 2022-02-23 NOTE — Patient Outreach (Signed)
Care Coordination [facility] Note Transition Care Management Follow-up Telephone Call Date of discharge and from where: Sunday, 02/19/22, Austin [name]; acute respiratory failure with hypoxia/ hypercarbia; sepsis pneumonia   How have you been since you were released from the hospital? Per spouse/ caregiver [name], on [facility] DPR: "Things are going pretty well, no problems so far; yes, we need to get that appointment scheduled so thank you for arranging the appointment for tomorrow, we will make sure he gets there.  Home Health team has come in and are working with him.  We also have a caregiver here for him to help me take care of him, because I am old too and have my own health problems, so her help is so good to have.  I handle all of his medications and we aren't having any concerns around his medications-- we got everything they told us to when they released him from the hospital." Any questions or concerns? No  Items Reviewed: Did the pt receive and understand the discharge instructions provided? Yes  thoroughly reviewed all aspects of discharge instructions with spouse/ caregiver who verbalizes excellent understanding of same Medications obtained and verified? Yes  full medication review completed, as below per interventions Other? No  Any new allergies since your discharge? No  Dietary orders reviewed? Yes Do you have support at home? Yes  patient requires assistance in all daily care activities: spouse/ caregiver and additional in-home caregiver assists with all care needs/ activities due to his baseline left sided paralysis  Home Care and Equipment/Supplies: Were home health services ordered? yes If so, what is the name of the agency? [facility]  Has the agency set up a time to come to the patient's home? Yes- confirmed first home visit has been completed Were any new equipment or medical supplies ordered?  No What is the name of the medical supply agency? N/A Were you able to get the  supplies/equipment? not applicable Do you have any questions related to the use of the equipment or supplies? No N/A  Functional Questionnaire: (I = Independent and D = Dependent) ADLs: D  Bathing/Dressing- D  Meal Prep- D  Eating- D  Maintaining continence- D  Transferring/Ambulation- D  Managing Meds- D  Follow up appointments reviewed:  PCP Hospital f/u appt confirmed? No  facilitated scheduling of same in real time:  Scheduled to see PCP on tomorrow, Friday, 2/02.24 @ 3:40 pm Specialist Hospital f/u appt confirmed? No  Scheduled to see - on - @ - Are transportation arrangements needed? No  If their condition worsens, is the pt aware to call PCP or go to the Emergency Dept.? Yes Was the patient provided with contact information for the PCP's office or ED? No- spouse declined; reports already has contact information for all care providers Was to pt encouraged to call back with questions or concerns? Yes- spouse denies need for ongoing care coordination outreach-- provided my direct contact information should questions/ concerns arise post-TOC call today  SDOH assessments and interventions completed:   Yes SDOH Interventions Today    Flowsheet Row Most Recent Value  SDOH Interventions   Food Insecurity Interventions Intervention Not Indicated  Transportation Interventions Intervention Not Indicated  [family/ friends provide transportation]      Interventions Today    Flowsheet Row Most Recent Value  General Interventions   General Interventions Discussed/Reviewed General Interventions Discussed, Doctor Visits  Doctor Visits Discussed/Reviewed Doctor Visits Discussed  Nutrition Interventions   Nutrition Discussed/Reviewed Nutrition Discussed  Pharmacy Interventions  Pharmacy Dicussed/Reviewed Pharmacy Topics Discussed, Medication Adherence  [Full medication review completed with caregiver,  no concerns or discrepancies noted,  confirmed patient obtained and is taking  all newly prescribed medications,  caregiver manages medications and denies questions/ concerns today]       Care Coordination Interventions:  PCP follow up appointment requested Care coordination outreach in real time to secure hospital follow up visit within one week of discharge-- successful; full medication review completed    Encounter Outcome:  Pt. Visit Completed    Oneta Rack, RN, BSN, CCRN Alumnus RN CM Care Coordination/ Transition of [place] Management [phone]: direct office

## 2022-02-24 ENCOUNTER — Telehealth: Payer: Self-pay | Admitting: Internal Medicine

## 2022-02-24 ENCOUNTER — Ambulatory Visit (INDEPENDENT_AMBULATORY_CARE_PROVIDER_SITE_OTHER): Payer: Medicare Other | Admitting: Internal Medicine

## 2022-02-24 ENCOUNTER — Encounter: Payer: Self-pay | Admitting: Internal Medicine

## 2022-02-24 VITALS — BP 126/70 | HR 72 | Temp 98.6°F | Ht 66.5 in | Wt 193.0 lb

## 2022-02-24 DIAGNOSIS — N400 Enlarged prostate without lower urinary tract symptoms: Secondary | ICD-10-CM | POA: Diagnosis not present

## 2022-02-24 DIAGNOSIS — E559 Vitamin D deficiency, unspecified: Secondary | ICD-10-CM

## 2022-02-24 DIAGNOSIS — I10 Essential (primary) hypertension: Secondary | ICD-10-CM | POA: Diagnosis not present

## 2022-02-24 DIAGNOSIS — Z8709 Personal history of other diseases of the respiratory system: Secondary | ICD-10-CM

## 2022-02-24 DIAGNOSIS — E669 Obesity, unspecified: Secondary | ICD-10-CM | POA: Insufficient documentation

## 2022-02-24 DIAGNOSIS — J690 Pneumonitis due to inhalation of food and vomit: Secondary | ICD-10-CM

## 2022-02-24 NOTE — Telephone Encounter (Signed)
Patient as an OV with Dr.John today

## 2022-02-24 NOTE — Assessment & Plan Note (Signed)
BP Readings from Last 3 Encounters:  02/24/22 126/70  02/19/22 (!) 142/79  09/28/21 132/76   Stable, pt to continue medical treatment norvasc 5 mg qd

## 2022-02-24 NOTE — Assessment & Plan Note (Signed)
Clinicaly resolved, no further tx or imaging needed,  to f/u any worsening symptoms or concerns

## 2022-02-24 NOTE — Assessment & Plan Note (Signed)
Last vitamin D Lab Results  Component Value Date   VD25OH 56.26 03/23/2021   Stable, cont oral replacement

## 2022-02-24 NOTE — Progress Notes (Signed)
Patient ID: Austin [name], male   DOB: 03-09-1932, 89 y.o.   MRN: [id number]        Chief Complaint: follow up recent aspirate pna, bph, htn with hospn 1/24 - 1/28       HPI:  Austin [name] is a 89 y.o. male here with c/o above, now overall doing well  Pt denies chest pain, increased sob or doe, wheezing, orthopnea, PND, increased LE swelling, palpitations, dizziness or syncope.   Pt denies polydipsia, polyuria, or new focal neuro s/s.    Pt denies fever, wt loss, night sweats, loss of appetite, or other constitutional symptoms  No falls.  No new issues since home except ongoing bph symptoms with Denies urinary symptoms such as dysuria, frequency, urgency, flank pain, hematuria or n/v, fever, chills.  Tolerating new amlodipine 5 mg well.  Finished d/c oral antibx yesterday.        Transitional Care Management elements noted today: 1)  Date of D/C: as above 2)  Medication reconciliation:  done today at end visit 3)  Review of D/C summary or other information:  done today 4)  Review of need for f/u on pending diagnostic tests and treatments:  done today - none needed 5)  Review of need for Interaction with other providers who will assume or resume care of pt specific problems: - none needed 6)  Education of patient/family/guardian or caregiver: done today with wife present  Wt Readings from Last 3 Encounters:  02/24/22 193 lb (87.5 kg)  02/17/22 193 lb 12.6 oz (87.9 kg)  08/20/18 200 lb (90.7 kg)   BP Readings from Last 3 Encounters:  02/24/22 126/70  02/19/22 (!) 142/79  09/28/21 132/76         Past Medical History:  Diagnosis Date   ADENOCARCINOMA, PROSTATE, GLEASON GRADE 5 01/21/2009   ALLERGIC RHINITIS 01/14/2007   Bilateral inguinal hernia    Cholecystitis    Cholelithiasis    CKD (chronic kidney disease) stage 3, GFR 30-59 ml/min (HCC) 04/30/2015   COLONIC POLYPS, HX OF 01/14/2007   ELEVATED PROSTATE SPECIFIC ANTIGEN 03/27/2008   ESOPHAGITIS 01/14/2007   GERD (gastroesophageal  reflux disease)    HYPERLIPIDEMIA 01/14/2007   HYPERTENSION 01/14/2007   LIBIDO, DECREASED 01/15/2007   PAF (paroxysmal atrial fibrillation) ([place])    Paralysis ([place])    from [place] 02/2013   Past Surgical History:  Procedure Laterality Date   CHOLECYSTECTOMY N/A 08/17/2017   Procedure: LAPAROSCOPIC CHOLECYSTECTOMY WITH INTRAOPERATIVE CHOLANGIOGRAM ERAS PATHWAY;  Surgeon: Judeth Horn, MD;  Location: [place];  Service: General;  Laterality: N/A;   ENDOSCOPIC RETROGRADE CHOLANGIOPANCREATOGRAPHY (ERCP) WITH PROPOFOL N/A 08/18/2017   Procedure: ENDOSCOPIC RETROGRADE CHOLANGIOPANCREATOGRAPHY (ERCP) WITH PROPOFOL;  Surgeon: Doran Stabler, MD;  Location: [facility];  Service: Endoscopy;  Laterality: N/A;   ERCP N/A 08/21/2017   Procedure: ENDOSCOPIC RETROGRADE CHOLANGIOPANCREATOGRAPHY (ERCP);  Surgeon: Clarene Essex, MD;  Location: [place] [facility];  Service: Endoscopy;  Laterality: N/A;   ESOPHAGOGASTRODUODENOSCOPY N/A 08/15/2013   Procedure: ESOPHAGOGASTRODUODENOSCOPY (EGD);  Surgeon: Gwenyth Ober, MD;  Location[facility]COPY;  Service: General;  Laterality: N/A;   ESOPHAGOGASTRODUODENOSCOPY (EGD) WITH PROPOFOL N/A 08/18/2017   Procedure: ESOPHAGOGASTRODUODENOSCOPY (EGD) WITH PROPOFOL;  Surgeon: Doran Stabler, MD;  Location: [facility] [place];  Service: Endoscopy;  Laterality: N/A;   HERNIA REPAIR     Bilateral Inguinal hernias   HIP PINNING,CANNULATED Left 07/18/2017   Procedure: CANNULATED HIP PINNING;  Surgeon: Carole Civil, MD;  [facility];  Service: Orthopedics;  Laterality:  Left;   HIP SURGERY     Left   IR EXCHANGE BILIARY DRAIN  05/24/2017   IR EXCHANGE BILIARY DRAIN  07/27/2017   IR PERC CHOLECYSTOSTOMY  04/10/2017   JOINT REPLACEMENT     PANCREATIC STENT PLACEMENT  08/21/2017   Procedure: PANCREATIC STENT PLACEMENT;  Surgeon: Clarene Essex, MD;  Location: [place];  Service: Endoscopy;;   PEG PLACEMENT N/A 09/02/2013   Procedure: PERCUTANEOUS ENDOSCOPIC GASTROSTOMY (PEG)  PLACEMENT;  Surgeon: Gwenyth Ober, MD;  Location: [place];  Service: General;  Laterality: N/A;   PROSTATE CRYOABLATION     REMOVAL OF STONES  08/21/2017   Procedure: REMOVAL OF STONES;  Surgeon: Clarene Essex, MD;  Location: [facility] ENDOSCOPY;  Service: Endoscopy;;   SPHINCTEROTOMY  08/21/2017   Procedure: Joan Mayans;  Surgeon: Clarene Essex, MD;  Location: [place];  Service: Endoscopy;;    reports that he has quit smoking. He has never used smokeless tobacco. He reports current alcohol use. He reports that he does not use drugs. family history includes Arthritis in an other family member; Hypertension in an other family member; Lung cancer in his father. No Known Allergies Current Outpatient Medications on File Prior to Visit  Medication Sig Dispense Refill   acetaminophen (TYLENOL) 325 MG tablet Take 650 mg by mouth every 8 (eight) hours as needed for moderate pain.      amLODipine (NORVASC) 5 MG tablet Take 1 tablet (5 mg total) by mouth daily. 30 tablet 1   calcitRIOL (ROCALTROL) 0.25 MCG capsule Take 1 capsule (0.25 mcg total) by mouth daily. 90 capsule 3   cholecalciferol (VITAMIN D) 1000 units tablet Take 2,000 Units by mouth daily.     diphenoxylate-atropine (LOMOTIL) 2.5-0.025 MG tablet Take 1 tablet by mouth 4 (four) times daily as needed for diarrhea or loose stools. 60 tablet 1   K Phos Mono-Sod Phos Di & Mono (PHOSPHA 250 NEUTRAL) 155-852-130 MG TABS Take 2 tablets by mouth 2 (two) times daily.     rosuvastatin (CRESTOR) 10 MG tablet TAKE ONE TABLET BY MOUTH ONCE DAILY. 90 tablet 3   sodium bicarbonate 650 MG tablet Take 1 tablet (650 mg total) by mouth 2 (two) times daily. (Patient taking differently: Take 1,300 mg by mouth 2 (two) times daily.) 60 tablet 11   vitamin B-12 (CYANOCOBALAMIN) 1000 MCG tablet Take 1 tablet (1,000 mcg total) by mouth daily. 90 tablet 3   warfarin (COUMADIN) 5 MG tablet TAKE 1 & 1/2 TO 2 TABLETS BY MOUTH DAILY OR AS DIRECTED 60 tablet 5    amoxicillin-clavulanate (AUGMENTIN) 875-125 MG tablet Take 1 tablet by mouth every 12 (twelve) hours. (Patient not taking: Reported on 02/24/2022) 8 tablet 0   azithromycin (ZITHROMAX) 500 MG tablet Take 1 tablet (500 mg total) by mouth daily. (Patient not taking: Reported on 02/24/2022) 3 tablet 0   No current facility-administered medications on file prior to visit.        ROS:  All others reviewed and negative.  Objective        PE:  BP 126/70 (BP Location: Right Arm, Patient Position: Sitting, Cuff Size: Large)   Pulse 72   Temp 98.6 F (37 C) (Oral)   Ht 5' 6.5" (1.689 m)   Wt 193 lb (87.5 kg)   SpO2 96%   BMI 30.68 kg/m                 Constitutional: Pt appears in NAD  HENT: Head: NCAT.                Right Ear: External ear normal.                 Left Ear: External ear normal.                Eyes: . Pupils are equal, round, and reactive to light. Conjunctivae and EOM are normal               Nose: without d/c or deformity               Neck: Neck supple. Gross normal ROM               Cardiovascular: Normal rate and regular rhythm.                 Pulmonary/Chest: Effort normal and breath sounds without rales or wheezing.                Abd:  Soft, NT, ND, + BS, no organomegaly               Neurological: [name] alert. At baseline orientation, motor with left hemiparesis               Skin: Skin is warm. No rashes, no other new lesions, LE edema - none               Psychiatric: Pt behavior is normal without agitation   Micro: none  Cardiac tracings I have personally interpreted today:  none  Pertinent Radiological findings (summarize): none   Lab Results  Component Value Date   WBC 7.9 02/18/2022   HGB 11.1 (L) 02/18/2022   HCT 33.9 (L) 02/18/2022   PLT 142 (L) 02/18/2022   GLUCOSE 87 02/18/2022   CHOL 152 03/23/2021   TRIG 233.0 (H) 03/23/2021   HDL 37.90 (L) 03/23/2021   LDLDIRECT 78.0 03/23/2021   LDLCALC 110 (H) 04/03/2013   ALT 15 02/16/2022    AST 18 02/16/2022   NA 139 02/18/2022   K 4.0 02/18/2022   CL 110 02/18/2022   CREATININE 1.52 (H) 02/18/2022   BUN 19 02/18/2022   CO2 22 02/18/2022   TSH 2.48 03/23/2021   PSA 1.81 03/10/2020   INR 2.1 (H) 02/18/2022   HGBA1C 5.8 (H) 02/16/2022   Assessment/Plan:  Austin [name] is a 89 y.o. White or Caucasian [1] Black or African American [2] male with  has a past medical history of ADENOCARCINOMA, PROSTATE, GLEASON GRADE 5 (01/21/2009), ALLERGIC RHINITIS (01/14/2007), Bilateral inguinal hernia, Cholecystitis, Cholelithiasis, CKD (chronic kidney disease) stage 3, GFR 30-59 ml/min (HCC) (04/30/2015), COLONIC POLYPS, HX OF (01/14/2007), ELEVATED PROSTATE SPECIFIC ANTIGEN (03/27/2008), ESOPHAGITIS (01/14/2007), GERD (gastroesophageal reflux disease), HYPERLIPIDEMIA (01/14/2007), HYPERTENSION (01/14/2007), LIBIDO, DECREASED (01/15/2007), PAF (paroxysmal atrial fibrillation) ([place]), and Paralysis ([place]).  Aspiration pneumonia ([place]) Clinicaly resolved, no further tx or imaging needed,  to f/u any worsening symptoms or concerns  BPH (benign prostatic hyperplasia) With worsening bph symptoms, has been lost to f/u with urology, ok for referral  urology for f/u  Essential hypertension BP Readings from Last 3 Encounters:  02/24/22 126/70  02/19/22 (!) 142/79  09/28/21 132/76   Stable, pt to continue medical treatment norvasc 5 mg qd   Vitamin D deficiency Last vitamin D Lab Results  Component Value Date   VD25OH 56.26 03/23/2021   Stable, cont oral replacement  Followup: Return if symptoms worsen or fail to improve.  Cathlean Cower, MD 02/24/2022 10:09 PM [facility]

## 2022-02-24 NOTE — Patient Instructions (Signed)
We can hold on further antibiotic today  Please continue all other medications as before, including the new blood pressure medication  Please have the pharmacy call with any other refills you may need.  Please continue your efforts at being more active, low cholesterol diet, and weight control.  Please keep your appointments with your specialists as you may have planned  You will be contacted regarding the referral for: Urology  Please make an Appointment to return in Mar 2024 as you have planned, or sooner if needed

## 2022-02-24 NOTE — Telephone Encounter (Signed)
[facility] called to notify that patient missed a visit for physical therapy today, due to having an appointment, just their policy to notify when patient misses a visit.

## 2022-02-24 NOTE — Assessment & Plan Note (Signed)
With worsening bph symptoms, has been lost to f/u with urology, ok for referral  urology for f/u

## 2022-02-27 DIAGNOSIS — G8191 Hemiplegia, unspecified affecting right dominant side: Secondary | ICD-10-CM | POA: Diagnosis not present

## 2022-02-27 DIAGNOSIS — J9622 Acute and chronic respiratory failure with hypercapnia: Secondary | ICD-10-CM | POA: Diagnosis not present

## 2022-02-27 DIAGNOSIS — J9621 Acute and chronic respiratory failure with hypoxia: Secondary | ICD-10-CM | POA: Diagnosis not present

## 2022-02-27 DIAGNOSIS — I480 Paroxysmal atrial fibrillation: Secondary | ICD-10-CM | POA: Diagnosis not present

## 2022-02-27 DIAGNOSIS — J690 Pneumonitis due to inhalation of food and vomit: Secondary | ICD-10-CM | POA: Diagnosis not present

## 2022-02-27 DIAGNOSIS — A419 Sepsis, unspecified organism: Secondary | ICD-10-CM | POA: Diagnosis not present

## 2022-03-01 DIAGNOSIS — I480 Paroxysmal atrial fibrillation: Secondary | ICD-10-CM | POA: Diagnosis not present

## 2022-03-01 DIAGNOSIS — J9621 Acute and chronic respiratory failure with hypoxia: Secondary | ICD-10-CM | POA: Diagnosis not present

## 2022-03-01 DIAGNOSIS — G8191 Hemiplegia, unspecified affecting right dominant side: Secondary | ICD-10-CM | POA: Diagnosis not present

## 2022-03-01 DIAGNOSIS — A419 Sepsis, unspecified organism: Secondary | ICD-10-CM | POA: Diagnosis not present

## 2022-03-01 DIAGNOSIS — J9622 Acute and chronic respiratory failure with hypercapnia: Secondary | ICD-10-CM | POA: Diagnosis not present

## 2022-03-01 DIAGNOSIS — J690 Pneumonitis due to inhalation of food and vomit: Secondary | ICD-10-CM | POA: Diagnosis not present

## 2022-03-02 DIAGNOSIS — J9622 Acute and chronic respiratory failure with hypercapnia: Secondary | ICD-10-CM | POA: Diagnosis not present

## 2022-03-02 DIAGNOSIS — J9621 Acute and chronic respiratory failure with hypoxia: Secondary | ICD-10-CM | POA: Diagnosis not present

## 2022-03-02 DIAGNOSIS — I480 Paroxysmal atrial fibrillation: Secondary | ICD-10-CM | POA: Diagnosis not present

## 2022-03-02 DIAGNOSIS — A419 Sepsis, unspecified organism: Secondary | ICD-10-CM | POA: Diagnosis not present

## 2022-03-02 DIAGNOSIS — G8191 Hemiplegia, unspecified affecting right dominant side: Secondary | ICD-10-CM | POA: Diagnosis not present

## 2022-03-02 DIAGNOSIS — J690 Pneumonitis due to inhalation of food and vomit: Secondary | ICD-10-CM | POA: Diagnosis not present

## 2022-03-06 DIAGNOSIS — J9622 Acute and chronic respiratory failure with hypercapnia: Secondary | ICD-10-CM | POA: Diagnosis not present

## 2022-03-06 DIAGNOSIS — G8191 Hemiplegia, unspecified affecting right dominant side: Secondary | ICD-10-CM | POA: Diagnosis not present

## 2022-03-06 DIAGNOSIS — I480 Paroxysmal atrial fibrillation: Secondary | ICD-10-CM | POA: Diagnosis not present

## 2022-03-06 DIAGNOSIS — J9621 Acute and chronic respiratory failure with hypoxia: Secondary | ICD-10-CM | POA: Diagnosis not present

## 2022-03-06 DIAGNOSIS — J690 Pneumonitis due to inhalation of food and vomit: Secondary | ICD-10-CM | POA: Diagnosis not present

## 2022-03-06 DIAGNOSIS — A419 Sepsis, unspecified organism: Secondary | ICD-10-CM | POA: Diagnosis not present

## 2022-03-08 DIAGNOSIS — J9621 Acute and chronic respiratory failure with hypoxia: Secondary | ICD-10-CM | POA: Diagnosis not present

## 2022-03-08 DIAGNOSIS — G8191 Hemiplegia, unspecified affecting right dominant side: Secondary | ICD-10-CM | POA: Diagnosis not present

## 2022-03-08 DIAGNOSIS — J690 Pneumonitis due to inhalation of food and vomit: Secondary | ICD-10-CM | POA: Diagnosis not present

## 2022-03-08 DIAGNOSIS — I480 Paroxysmal atrial fibrillation: Secondary | ICD-10-CM | POA: Diagnosis not present

## 2022-03-08 DIAGNOSIS — A419 Sepsis, unspecified organism: Secondary | ICD-10-CM | POA: Diagnosis not present

## 2022-03-08 DIAGNOSIS — J9622 Acute and chronic respiratory failure with hypercapnia: Secondary | ICD-10-CM | POA: Diagnosis not present

## 2022-03-09 DIAGNOSIS — G8191 Hemiplegia, unspecified affecting right dominant side: Secondary | ICD-10-CM | POA: Diagnosis not present

## 2022-03-09 DIAGNOSIS — I480 Paroxysmal atrial fibrillation: Secondary | ICD-10-CM | POA: Diagnosis not present

## 2022-03-09 DIAGNOSIS — J9622 Acute and chronic respiratory failure with hypercapnia: Secondary | ICD-10-CM | POA: Diagnosis not present

## 2022-03-09 DIAGNOSIS — A419 Sepsis, unspecified organism: Secondary | ICD-10-CM | POA: Diagnosis not present

## 2022-03-09 DIAGNOSIS — J690 Pneumonitis due to inhalation of food and vomit: Secondary | ICD-10-CM | POA: Diagnosis not present

## 2022-03-09 DIAGNOSIS — J9621 Acute and chronic respiratory failure with hypoxia: Secondary | ICD-10-CM | POA: Diagnosis not present

## 2022-03-10 DIAGNOSIS — J9622 Acute and chronic respiratory failure with hypercapnia: Secondary | ICD-10-CM | POA: Diagnosis not present

## 2022-03-10 DIAGNOSIS — G8191 Hemiplegia, unspecified affecting right dominant side: Secondary | ICD-10-CM | POA: Diagnosis not present

## 2022-03-10 DIAGNOSIS — J9621 Acute and chronic respiratory failure with hypoxia: Secondary | ICD-10-CM | POA: Diagnosis not present

## 2022-03-10 DIAGNOSIS — J690 Pneumonitis due to inhalation of food and vomit: Secondary | ICD-10-CM | POA: Diagnosis not present

## 2022-03-10 DIAGNOSIS — A419 Sepsis, unspecified organism: Secondary | ICD-10-CM | POA: Diagnosis not present

## 2022-03-10 DIAGNOSIS — I480 Paroxysmal atrial fibrillation: Secondary | ICD-10-CM | POA: Diagnosis not present

## 2022-03-13 DIAGNOSIS — A419 Sepsis, unspecified organism: Secondary | ICD-10-CM | POA: Diagnosis not present

## 2022-03-13 DIAGNOSIS — J9622 Acute and chronic respiratory failure with hypercapnia: Secondary | ICD-10-CM | POA: Diagnosis not present

## 2022-03-13 DIAGNOSIS — I480 Paroxysmal atrial fibrillation: Secondary | ICD-10-CM | POA: Diagnosis not present

## 2022-03-13 DIAGNOSIS — G8191 Hemiplegia, unspecified affecting right dominant side: Secondary | ICD-10-CM | POA: Diagnosis not present

## 2022-03-13 DIAGNOSIS — J9621 Acute and chronic respiratory failure with hypoxia: Secondary | ICD-10-CM | POA: Diagnosis not present

## 2022-03-13 DIAGNOSIS — J690 Pneumonitis due to inhalation of food and vomit: Secondary | ICD-10-CM | POA: Diagnosis not present

## 2022-03-14 ENCOUNTER — Ambulatory Visit: Payer: Medicare Other | Attending: Cardiology | Admitting: *Deleted

## 2022-03-14 DIAGNOSIS — Z5181 Encounter for therapeutic drug level monitoring: Secondary | ICD-10-CM | POA: Insufficient documentation

## 2022-03-14 DIAGNOSIS — I4891 Unspecified atrial fibrillation: Secondary | ICD-10-CM | POA: Insufficient documentation

## 2022-03-14 LAB — POCT INR: INR: 3.4 — AB (ref 2.0–3.0)

## 2022-03-14 NOTE — Patient Instructions (Signed)
Hold warfarin tonight then continue 1 1/2 tablets daily except 2 tablets on Sundays and Thursdays Recheck in 4 weeks in [place] office.

## 2022-03-16 DIAGNOSIS — I480 Paroxysmal atrial fibrillation: Secondary | ICD-10-CM | POA: Diagnosis not present

## 2022-03-16 DIAGNOSIS — J690 Pneumonitis due to inhalation of food and vomit: Secondary | ICD-10-CM | POA: Diagnosis not present

## 2022-03-16 DIAGNOSIS — A419 Sepsis, unspecified organism: Secondary | ICD-10-CM | POA: Diagnosis not present

## 2022-03-16 DIAGNOSIS — J9621 Acute and chronic respiratory failure with hypoxia: Secondary | ICD-10-CM | POA: Diagnosis not present

## 2022-03-16 DIAGNOSIS — G8191 Hemiplegia, unspecified affecting right dominant side: Secondary | ICD-10-CM | POA: Diagnosis not present

## 2022-03-16 DIAGNOSIS — J9622 Acute and chronic respiratory failure with hypercapnia: Secondary | ICD-10-CM | POA: Diagnosis not present

## 2022-03-17 DIAGNOSIS — J9621 Acute and chronic respiratory failure with hypoxia: Secondary | ICD-10-CM | POA: Diagnosis not present

## 2022-03-17 DIAGNOSIS — J9622 Acute and chronic respiratory failure with hypercapnia: Secondary | ICD-10-CM | POA: Diagnosis not present

## 2022-03-17 DIAGNOSIS — I480 Paroxysmal atrial fibrillation: Secondary | ICD-10-CM | POA: Diagnosis not present

## 2022-03-17 DIAGNOSIS — A419 Sepsis, unspecified organism: Secondary | ICD-10-CM | POA: Diagnosis not present

## 2022-03-17 DIAGNOSIS — G8191 Hemiplegia, unspecified affecting right dominant side: Secondary | ICD-10-CM | POA: Diagnosis not present

## 2022-03-17 DIAGNOSIS — J690 Pneumonitis due to inhalation of food and vomit: Secondary | ICD-10-CM | POA: Diagnosis not present

## 2022-03-22 DIAGNOSIS — J9622 Acute and chronic respiratory failure with hypercapnia: Secondary | ICD-10-CM | POA: Diagnosis not present

## 2022-03-22 DIAGNOSIS — K2100 Gastro-esophageal reflux disease with esophagitis, without bleeding: Secondary | ICD-10-CM | POA: Diagnosis not present

## 2022-03-22 DIAGNOSIS — J9621 Acute and chronic respiratory failure with hypoxia: Secondary | ICD-10-CM | POA: Diagnosis not present

## 2022-03-22 DIAGNOSIS — N1832 Chronic kidney disease, stage 3b: Secondary | ICD-10-CM | POA: Diagnosis not present

## 2022-03-22 DIAGNOSIS — A419 Sepsis, unspecified organism: Secondary | ICD-10-CM | POA: Diagnosis not present

## 2022-03-22 DIAGNOSIS — Z7901 Long term (current) use of anticoagulants: Secondary | ICD-10-CM | POA: Diagnosis not present

## 2022-03-22 DIAGNOSIS — C61 Malignant neoplasm of prostate: Secondary | ICD-10-CM | POA: Diagnosis not present

## 2022-03-22 DIAGNOSIS — I480 Paroxysmal atrial fibrillation: Secondary | ICD-10-CM | POA: Diagnosis not present

## 2022-03-22 DIAGNOSIS — S069XAD Unspecified intracranial injury with loss of consciousness status unknown, subsequent encounter: Secondary | ICD-10-CM | POA: Diagnosis not present

## 2022-03-22 DIAGNOSIS — G8191 Hemiplegia, unspecified affecting right dominant side: Secondary | ICD-10-CM | POA: Diagnosis not present

## 2022-03-22 DIAGNOSIS — E785 Hyperlipidemia, unspecified: Secondary | ICD-10-CM | POA: Diagnosis not present

## 2022-03-22 DIAGNOSIS — I129 Hypertensive chronic kidney disease with stage 1 through stage 4 chronic kidney disease, or unspecified chronic kidney disease: Secondary | ICD-10-CM | POA: Diagnosis not present

## 2022-03-22 DIAGNOSIS — E559 Vitamin D deficiency, unspecified: Secondary | ICD-10-CM | POA: Diagnosis not present

## 2022-03-22 DIAGNOSIS — R7301 Impaired fasting glucose: Secondary | ICD-10-CM | POA: Diagnosis not present

## 2022-03-22 DIAGNOSIS — J690 Pneumonitis due to inhalation of food and vomit: Secondary | ICD-10-CM | POA: Diagnosis not present

## 2022-03-24 DIAGNOSIS — A419 Sepsis, unspecified organism: Secondary | ICD-10-CM | POA: Diagnosis not present

## 2022-03-24 DIAGNOSIS — I480 Paroxysmal atrial fibrillation: Secondary | ICD-10-CM | POA: Diagnosis not present

## 2022-03-24 DIAGNOSIS — J9622 Acute and chronic respiratory failure with hypercapnia: Secondary | ICD-10-CM | POA: Diagnosis not present

## 2022-03-24 DIAGNOSIS — J690 Pneumonitis due to inhalation of food and vomit: Secondary | ICD-10-CM | POA: Diagnosis not present

## 2022-03-24 DIAGNOSIS — J9621 Acute and chronic respiratory failure with hypoxia: Secondary | ICD-10-CM | POA: Diagnosis not present

## 2022-03-24 DIAGNOSIS — G8191 Hemiplegia, unspecified affecting right dominant side: Secondary | ICD-10-CM | POA: Diagnosis not present

## 2022-03-29 ENCOUNTER — Ambulatory Visit (INDEPENDENT_AMBULATORY_CARE_PROVIDER_SITE_OTHER): Payer: Medicare Other | Admitting: Internal Medicine

## 2022-03-29 ENCOUNTER — Encounter: Payer: Self-pay | Admitting: Internal Medicine

## 2022-03-29 ENCOUNTER — Other Ambulatory Visit: Payer: Self-pay | Admitting: Internal Medicine

## 2022-03-29 VITALS — BP 138/64 | HR 71 | Temp 98.1°F | Ht 66.5 in

## 2022-03-29 DIAGNOSIS — E559 Vitamin D deficiency, unspecified: Secondary | ICD-10-CM | POA: Diagnosis not present

## 2022-03-29 DIAGNOSIS — N1832 Chronic kidney disease, stage 3b: Secondary | ICD-10-CM

## 2022-03-29 DIAGNOSIS — R739 Hyperglycemia, unspecified: Secondary | ICD-10-CM

## 2022-03-29 DIAGNOSIS — E782 Mixed hyperlipidemia: Secondary | ICD-10-CM | POA: Diagnosis not present

## 2022-03-29 DIAGNOSIS — I10 Essential (primary) hypertension: Secondary | ICD-10-CM

## 2022-03-29 DIAGNOSIS — E538 Deficiency of other specified B group vitamins: Secondary | ICD-10-CM

## 2022-03-29 LAB — CBC WITH DIFFERENTIAL/PLATELET
Basophils Absolute: 0.1 10*3/uL (ref 0.0–0.1)
Basophils Relative: 0.8 % (ref 0.0–3.0)
Eosinophils Absolute: 0.2 10*3/uL (ref 0.0–0.7)
Eosinophils Relative: 2.1 % (ref 0.0–5.0)
HCT: 44.9 % (ref 39.0–52.0)
Hemoglobin: 15 g/dL (ref 13.0–17.0)
Lymphocytes Relative: 12.9 % (ref 12.0–46.0)
Lymphs Abs: 1.1 10*3/uL (ref 0.7–4.0)
MCHC: 33.5 g/dL (ref 30.0–36.0)
MCV: 94.9 fl (ref 78.0–100.0)
Monocytes Absolute: 0.8 10*3/uL (ref 0.1–1.0)
Monocytes Relative: 9.1 % (ref 3.0–12.0)
Neutro Abs: 6.2 10*3/uL (ref 1.4–7.7)
Neutrophils Relative %: 75.1 % (ref 43.0–77.0)
Platelets: 185 10*3/uL (ref 150.0–400.0)
RBC: 4.73 Mil/uL (ref 4.22–5.81)
RDW: 14.1 % (ref 11.5–15.5)
WBC: 8.3 10*3/uL (ref 4.0–10.5)

## 2022-03-29 LAB — BASIC METABOLIC PANEL
BUN: 19 mg/dL (ref 6–23)
CO2: 25 mEq/L (ref 19–32)
Calcium: 10 mg/dL (ref 8.4–10.5)
Chloride: 106 mEq/L (ref 96–112)
Creatinine, Ser: 1.51 mg/dL — ABNORMAL HIGH (ref 0.40–1.50)
GFR: 41.37 mL/min — ABNORMAL LOW (ref >60.00)
Glucose, Bld: 76 mg/dL (ref 70–99)
Potassium: 4.1 mEq/L (ref 3.5–5.1)
Sodium: 140 mEq/L (ref 135–145)

## 2022-03-29 LAB — HEPATIC FUNCTION PANEL
ALT: 14 U/L (ref 0–53)
AST: 20 U/L (ref 0–37)
Albumin: 4.2 g/dL (ref 3.5–5.2)
Alkaline Phosphatase: 58 U/L (ref 39–117)
Bilirubin, Direct: 0.1 mg/dL (ref 0.0–0.3)
Total Bilirubin: 0.6 mg/dL (ref 0.2–1.2)
Total Protein: 7.7 g/dL (ref 6.0–8.3)

## 2022-03-29 LAB — VITAMIN B12: Vitamin B-12: >1500 pg/mL — ABNORMAL HIGH (ref 211–911)

## 2022-03-29 LAB — LDL CHOLESTEROL, DIRECT: Direct LDL: 108 mg/dL

## 2022-03-29 LAB — LIPID PANEL
Cholesterol: 188 mg/dL (ref 0–200)
HDL: 46.4 mg/dL (ref >39.00)
NonHDL: 141.24
Total CHOL/HDL Ratio: 4
Triglycerides: 231 mg/dL — ABNORMAL HIGH (ref 0.0–149.0)
VLDL: 46.2 mg/dL — ABNORMAL HIGH (ref 0.0–40.0)

## 2022-03-29 LAB — TSH: TSH: 2.56 u[IU]/mL (ref 0.35–5.50)

## 2022-03-29 LAB — HEMOGLOBIN A1C: Hgb A1c MFr Bld: 5.8 % (ref 4.6–6.5)

## 2022-03-29 LAB — VITAMIN D 25 HYDROXY (VIT D DEFICIENCY, FRACTURES): VITD: 61.58 ng/mL (ref 30.00–100.00)

## 2022-03-29 MED ORDER — ROSUVASTATIN CALCIUM 20 MG PO TABS: 20.0000 mg | ORAL_TABLET | Freq: Every day | ORAL | 3 refills | Status: DC

## 2022-03-29 MED ORDER — AMLODIPINE BESYLATE 5 MG PO TABS: 5.0000 mg | ORAL_TABLET | Freq: Every day | ORAL | 3 refills | Status: DC

## 2022-03-29 NOTE — Progress Notes (Unsigned)
Patient ID: Austin [name], male   DOB: [birth year], 89 y.o.   MRN: [id number]        Chief Complaint: follow up HTN, HLD and hyperglycemia , ckd, low b12       HPI:  Austin [name] is a 89 y.o. male here overall doing ok.  Pt denies chest pain, increased sob or doe, wheezing, orthopnea, PND, increased LE swelling, palpitations, dizziness or syncope.   Pt denies polydipsia, polyuria, or new focal neuro s/s.    Pt denies fever, wt loss, night sweats, loss of appetite, or other constitutional symptoms         Wt Readings from Last 3 Encounters:  02/24/22 193 lb (87.5 kg)  02/17/22 193 lb 12.6 oz (87.9 kg)  08/20/18 200 lb (90.7 kg)   BP Readings from Last 3 Encounters:  03/29/22 138/64  02/24/22 126/70  02/19/22 (!) 142/79         Past Medical History:  Diagnosis Date   ADENOCARCINOMA, PROSTATE, GLEASON GRADE 5 01/21/2009   ALLERGIC RHINITIS 01/14/2007   Bilateral inguinal hernia    Cholecystitis    Cholelithiasis    CKD (chronic kidney disease) stage 3, GFR 30-59 ml/min (HCC) 04/30/2015   COLONIC POLYPS, HX OF 01/14/2007   ELEVATED PROSTATE SPECIFIC ANTIGEN 03/27/2008   ESOPHAGITIS 01/14/2007   GERD (gastroesophageal reflux disease)    HYPERLIPIDEMIA 01/14/2007   HYPERTENSION 01/14/2007   LIBIDO, DECREASED 01/15/2007   PAF (paroxysmal atrial fibrillation) ([place])    Paralysis ([place])    from [place] 02/2013   Past Surgical History:  Procedure Laterality Date   CHOLECYSTECTOMY N/A 08/17/2017   Procedure: LAPAROSCOPIC CHOLECYSTECTOMY WITH INTRAOPERATIVE CHOLANGIOGRAM ERAS PATHWAY;  Surgeon: Judeth Horn, MD;  Location: [place];  Service: General;  Laterality: N/A;   ENDOSCOPIC RETROGRADE CHOLANGIOPANCREATOGRAPHY (ERCP) WITH PROPOFOL N/A 08/18/2017   Procedure: ENDOSCOPIC RETROGRADE CHOLANGIOPANCREATOGRAPHY (ERCP) WITH PROPOFOL;  Surgeon: Doran Stabler, MD;  Location: [facility]COPY;  Service: Endoscopy;  Laterality: N/A;   ERCP N/A 08/21/2017   Procedure: ENDOSCOPIC RETROGRADE  CHOLANGIOPANCREATOGRAPHY (ERCP);  Surgeon: Clarene Essex, MD;  Location: [place];  Service: Endoscopy;  Laterality: N/A;   ESOPHAGOGASTRODUODENOSCOPY N/A 08/15/2013   Procedure: ESOPHAGOGASTRODUODENOSCOPY (EGD);  Surgeon: Gwenyth Ober, MD;  Location: [facility];  Service: General;  Laterality: N/A;   ESOPHAGOGASTRODUODENOSCOPY (EGD) WITH PROPOFOL N/A 08/18/2017   Procedure: ESOPHAGOGASTRODUODENOSCOPY (EGD) WITH PROPOFOL;  Surgeon: Doran Stabler, MD;  Location: [place];  Service: Endoscopy;  Laterality: N/A;   HERNIA REPAIR     Bilateral Inguinal hernias   HIP PINNING,CANNULATED Left 07/18/2017   Procedure: CANNULATED HIP PINNING;  Surgeon: Carole Civil, MD;  [facility];  Service: Orthopedics;  Laterality: Left;   HIP SURGERY     Left   IR EXCHANGE BILIARY DRAIN  05/24/2017   IR EXCHANGE BILIARY DRAIN  07/27/2017   IR PERC CHOLECYSTOSTOMY  04/10/2017   JOINT REPLACEMENT     PANCREATIC STENT PLACEMENT  08/21/2017   Procedure: PANCREATIC STENT PLACEMENT;  Surgeon: Clarene Essex, MD;  Location: [facility];  Service: Endoscopy;;   PEG PLACEMENT N/A 09/02/2013   Procedure: PERCUTANEOUS ENDOSCOPIC GASTROSTOMY (PEG) PLACEMENT;  Surgeon: Gwenyth Ober, MD;  Location: [place];  Service: General;  Laterality: N/A;   PROSTATE CRYOABLATION     REMOVAL OF STONES  08/21/2017   Procedure: REMOVAL OF STONES;  Surgeon: Clarene Essex, MD;  Location: [place];  Service: Endoscopy;;   SPHINCTEROTOMY  08/21/2017   Procedure: Joan Mayans;  Surgeon: Watt Climes,  Altamese Dilling, MD;  Location: [place];  Service: Endoscopy;;    reports that he has quit smoking. He has never used smokeless tobacco. He reports current alcohol use. He reports that he does not use drugs. family history includes Arthritis in an other family member; Hypertension in an other family member; Lung cancer in his father. No Known Allergies Current Outpatient Medications on File Prior to Visit  Medication Sig Dispense Refill    calcitRIOL (ROCALTROL) 0.25 MCG capsule Take 1 capsule (0.25 mcg total) by mouth daily. 90 capsule 3   cholecalciferol (VITAMIN D) 1000 units tablet Take 2,000 Units by mouth daily.     diphenoxylate-atropine (LOMOTIL) 2.5-0.025 MG tablet Take 1 tablet by mouth 4 (four) times daily as needed for diarrhea or loose stools. 60 tablet 1   K Phos Mono-Sod Phos Di & Mono (PHOSPHA 250 NEUTRAL) 155-852-130 MG TABS Take 2 tablets by mouth 2 (two) times daily.     sodium bicarbonate 650 MG tablet Take 1 tablet (650 mg total) by mouth 2 (two) times daily. (Patient taking differently: Take 1,300 mg by mouth 2 (two) times daily.) 60 tablet 11   vitamin B-12 (CYANOCOBALAMIN) 1000 MCG tablet Take 1 tablet (1,000 mcg total) by mouth daily. 90 tablet 3   warfarin (COUMADIN) 5 MG tablet TAKE 1 & 1/2 TO 2 TABLETS BY MOUTH DAILY OR AS DIRECTED 60 tablet 5   acetaminophen (TYLENOL) 325 MG tablet Take 650 mg by mouth every 8 (eight) hours as needed for moderate pain.  (Patient not taking: Reported on 03/29/2022)     No current facility-administered medications on file prior to visit.        ROS:  All others reviewed and negative.  Objective        PE:  BP 138/64   Pulse 71   Temp 98.1 F (36.7 C) (Temporal)   Ht 5' 6.5" (1.689 m)   SpO2 98%   BMI 30.68 kg/m                 Constitutional: Pt appears in NAD               HENT: Head: NCAT.                Right Ear: External ear normal.                 Left Ear: External ear normal.                Eyes: . Pupils are equal, round, and reactive to light. Conjunctivae and EOM are normal               Nose: without d/c or deformity               Neck: Neck supple. Gross normal ROM               Cardiovascular: Normal rate and regular rhythm.                 Pulmonary/Chest: Effort normal and breath sounds without rales or wheezing.                Abd:  Soft, NT, ND, + BS, no organomegaly               Neurological: [name] alert. At baseline orientation, motor  with persistent left hemiparesis               Skin: Skin is warm. No rashes, no  other new lesions, LE edema - none               Psychiatric: Pt behavior is normal without agitation   Micro: none  Cardiac tracings I have personally interpreted today:  none  Pertinent Radiological findings (summarize): none   Lab Results  Component Value Date   WBC 8.3 03/29/2022   HGB 15.0 03/29/2022   HCT 44.9 03/29/2022   PLT 185.0 03/29/2022   GLUCOSE 76 03/29/2022   CHOL 188 03/29/2022   TRIG 231.0 (H) 03/29/2022   HDL 46.40 03/29/2022   LDLDIRECT 108.0 03/29/2022   LDLCALC 110 (H) 04/03/2013   ALT 14 03/29/2022   AST 20 03/29/2022   NA 140 03/29/2022   K 4.1 03/29/2022   CL 106 03/29/2022   CREATININE 1.51 (H) 03/29/2022   BUN 19 03/29/2022   CO2 25 03/29/2022   TSH 2.56 03/29/2022   PSA 1.81 03/10/2020   INR 3.4 (A) 03/14/2022   HGBA1C 5.8 03/29/2022   Assessment/Plan:  Austin [name] is a 89 y.o. White or Caucasian [1] Black or African American [2] male with  has a past medical history of ADENOCARCINOMA, PROSTATE, GLEASON GRADE 5 (01/21/2009), ALLERGIC RHINITIS (01/14/2007), Bilateral inguinal hernia, Cholecystitis, Cholelithiasis, CKD (chronic kidney disease) stage 3, GFR 30-59 ml/min (HCC) (04/30/2015), COLONIC POLYPS, HX OF (01/14/2007), ELEVATED PROSTATE SPECIFIC ANTIGEN (03/27/2008), ESOPHAGITIS (01/14/2007), GERD (gastroesophageal reflux disease), HYPERLIPIDEMIA (01/14/2007), HYPERTENSION (01/14/2007), LIBIDO, DECREASED (01/15/2007), PAF (paroxysmal atrial fibrillation) ([place]), and Paralysis ([place]).  B12 deficiency Lab Results  Component Value Date   VITAMINB12 >1500 (H) 03/29/2022   Stable, cont oral replacement - b12 1000 mcg qd   Chronic kidney disease, stage 3b (HCC) Lab Results  Component Value Date   CREATININE 1.51 (H) 03/29/2022   Stable overall, cont to avoid nephrotoxins   Essential hypertension BP Readings from Last 3 Encounters:  03/29/22 138/64  02/24/22  126/70  02/19/22 (!) 142/79   Stable, pt to continue medical treatment norvasc 5 mg qd   Hyperglycemia Lab Results  Component Value Date   HGBA1C 5.8 03/29/2022   Stable, pt to continue current medical treatment  - diet, wt control   Mixed hyperlipidemia Lab Results  Component Value Date   LDLCALC 110 (H) 04/03/2013   Uncontrolled, goal ldl < 70, pt to increase crestor to 20 mg qd, low chol diet   Vitamin D deficiency Last vitamin D Lab Results  Component Value Date   VD25OH 61.58 03/29/2022   Stable, cont oral replacement  Followup: Return in about 6 months (around 09/29/2022).  Cathlean Cower, MD 03/30/2022 8:06 PM [facility]

## 2022-03-29 NOTE — Patient Instructions (Addendum)
Please have your Shingrix (shingles) shots done at your local pharmacy., and the Tdap tetanus shot and Prevnar 20 pneumonia shot as well  Please continue all other medications as before, and refills have been done if requested.  Please have the pharmacy call with any other refills you may need.  Please continue your efforts at being more active, low cholesterol diet, and weight control.  You are otherwise up to date with prevention measures today.  Please keep your appointments with your specialists as you may have planned  Please go to the LAB at the blood drawing area for the tests to be done  You will be contacted by phone if any changes need to be made immediately.  Otherwise, you will receive a letter about your results with an explanation, but please check with MyChart first.  Please remember to sign up for MyChart if you have not done so, as this will be important to you in the future with finding out test results, communicating by private email, and scheduling acute appointments online when needed.  Please make an Appointment to return in 6 months, or sooner if needed

## 2022-03-30 ENCOUNTER — Encounter: Payer: Self-pay | Admitting: Internal Medicine

## 2022-03-30 DIAGNOSIS — J9621 Acute and chronic respiratory failure with hypoxia: Secondary | ICD-10-CM | POA: Diagnosis not present

## 2022-03-30 DIAGNOSIS — A419 Sepsis, unspecified organism: Secondary | ICD-10-CM | POA: Diagnosis not present

## 2022-03-30 DIAGNOSIS — J690 Pneumonitis due to inhalation of food and vomit: Secondary | ICD-10-CM | POA: Diagnosis not present

## 2022-03-30 DIAGNOSIS — G8191 Hemiplegia, unspecified affecting right dominant side: Secondary | ICD-10-CM | POA: Diagnosis not present

## 2022-03-30 DIAGNOSIS — I480 Paroxysmal atrial fibrillation: Secondary | ICD-10-CM | POA: Diagnosis not present

## 2022-03-30 DIAGNOSIS — J9622 Acute and chronic respiratory failure with hypercapnia: Secondary | ICD-10-CM | POA: Diagnosis not present

## 2022-03-30 NOTE — Assessment & Plan Note (Signed)
Lab Results  Component Value Date   CREATININE 1.51 (H) 03/29/2022   Stable overall, cont to avoid nephrotoxins

## 2022-03-30 NOTE — Assessment & Plan Note (Signed)
Last vitamin D Lab Results  Component Value Date   VD25OH 61.58 03/29/2022   Stable, cont oral replacement

## 2022-03-30 NOTE — Assessment & Plan Note (Signed)
Lab Results  Component Value Date   VITAMINB12 >1500 (H) 03/29/2022   Stable, cont oral replacement - b12 1000 mcg qd

## 2022-03-30 NOTE — Assessment & Plan Note (Signed)
Lab Results  Component Value Date   LDLCALC 110 (H) 04/03/2013   Uncontrolled, goal ldl < 70, pt to increase crestor to 20 mg qd, low chol diet

## 2022-03-30 NOTE — Assessment & Plan Note (Signed)
BP Readings from Last 3 Encounters:  03/29/22 138/64  02/24/22 126/70  02/19/22 (!) 142/79   Stable, pt to continue medical treatment norvasc 5 mg qd

## 2022-03-30 NOTE — Assessment & Plan Note (Signed)
Lab Results  Component Value Date   HGBA1C 5.8 03/29/2022   Stable, pt to continue current medical treatment  - diet, wt control

## 2022-03-31 DIAGNOSIS — J9621 Acute and chronic respiratory failure with hypoxia: Secondary | ICD-10-CM | POA: Diagnosis not present

## 2022-03-31 DIAGNOSIS — J9622 Acute and chronic respiratory failure with hypercapnia: Secondary | ICD-10-CM | POA: Diagnosis not present

## 2022-03-31 DIAGNOSIS — G8191 Hemiplegia, unspecified affecting right dominant side: Secondary | ICD-10-CM | POA: Diagnosis not present

## 2022-03-31 DIAGNOSIS — A419 Sepsis, unspecified organism: Secondary | ICD-10-CM | POA: Diagnosis not present

## 2022-03-31 DIAGNOSIS — J690 Pneumonitis due to inhalation of food and vomit: Secondary | ICD-10-CM | POA: Diagnosis not present

## 2022-03-31 DIAGNOSIS — I480 Paroxysmal atrial fibrillation: Secondary | ICD-10-CM | POA: Diagnosis not present

## 2022-04-03 DIAGNOSIS — G8191 Hemiplegia, unspecified affecting right dominant side: Secondary | ICD-10-CM | POA: Diagnosis not present

## 2022-04-03 DIAGNOSIS — J9621 Acute and chronic respiratory failure with hypoxia: Secondary | ICD-10-CM | POA: Diagnosis not present

## 2022-04-03 DIAGNOSIS — J9622 Acute and chronic respiratory failure with hypercapnia: Secondary | ICD-10-CM | POA: Diagnosis not present

## 2022-04-03 DIAGNOSIS — I480 Paroxysmal atrial fibrillation: Secondary | ICD-10-CM | POA: Diagnosis not present

## 2022-04-03 DIAGNOSIS — J690 Pneumonitis due to inhalation of food and vomit: Secondary | ICD-10-CM | POA: Diagnosis not present

## 2022-04-03 DIAGNOSIS — A419 Sepsis, unspecified organism: Secondary | ICD-10-CM | POA: Diagnosis not present

## 2022-04-05 DIAGNOSIS — J9621 Acute and chronic respiratory failure with hypoxia: Secondary | ICD-10-CM | POA: Diagnosis not present

## 2022-04-05 DIAGNOSIS — I480 Paroxysmal atrial fibrillation: Secondary | ICD-10-CM | POA: Diagnosis not present

## 2022-04-05 DIAGNOSIS — A419 Sepsis, unspecified organism: Secondary | ICD-10-CM | POA: Diagnosis not present

## 2022-04-05 DIAGNOSIS — J9622 Acute and chronic respiratory failure with hypercapnia: Secondary | ICD-10-CM | POA: Diagnosis not present

## 2022-04-05 DIAGNOSIS — J690 Pneumonitis due to inhalation of food and vomit: Secondary | ICD-10-CM | POA: Diagnosis not present

## 2022-04-05 DIAGNOSIS — G8191 Hemiplegia, unspecified affecting right dominant side: Secondary | ICD-10-CM | POA: Diagnosis not present

## 2022-04-10 DIAGNOSIS — E872 Acidosis, unspecified: Secondary | ICD-10-CM | POA: Diagnosis not present

## 2022-04-10 DIAGNOSIS — N2581 Secondary hyperparathyroidism of renal origin: Secondary | ICD-10-CM | POA: Diagnosis not present

## 2022-04-10 DIAGNOSIS — N1832 Chronic kidney disease, stage 3b: Secondary | ICD-10-CM | POA: Diagnosis not present

## 2022-04-10 DIAGNOSIS — D631 Anemia in chronic kidney disease: Secondary | ICD-10-CM | POA: Diagnosis not present

## 2022-04-10 DIAGNOSIS — I129 Hypertensive chronic kidney disease with stage 1 through stage 4 chronic kidney disease, or unspecified chronic kidney disease: Secondary | ICD-10-CM | POA: Diagnosis not present

## 2022-04-11 ENCOUNTER — Ambulatory Visit: Payer: Medicare Other | Attending: Cardiology | Admitting: *Deleted

## 2022-04-11 DIAGNOSIS — I4891 Unspecified atrial fibrillation: Secondary | ICD-10-CM | POA: Diagnosis not present

## 2022-04-11 DIAGNOSIS — Z5181 Encounter for therapeutic drug level monitoring: Secondary | ICD-10-CM | POA: Insufficient documentation

## 2022-04-11 LAB — LAB REPORT - SCANNED: EGFR: 42

## 2022-04-11 LAB — POCT INR: INR: 2.7 (ref 2.0–3.0)

## 2022-04-11 NOTE — Patient Instructions (Signed)
Continue warfarin 1 1/2 tablets daily except 2 tablets on Sundays and Thursdays Recheck in 4 weeks in [place] office.

## 2022-04-12 DIAGNOSIS — G8191 Hemiplegia, unspecified affecting right dominant side: Secondary | ICD-10-CM | POA: Diagnosis not present

## 2022-04-12 DIAGNOSIS — A419 Sepsis, unspecified organism: Secondary | ICD-10-CM | POA: Diagnosis not present

## 2022-04-12 DIAGNOSIS — J9621 Acute and chronic respiratory failure with hypoxia: Secondary | ICD-10-CM | POA: Diagnosis not present

## 2022-04-12 DIAGNOSIS — J690 Pneumonitis due to inhalation of food and vomit: Secondary | ICD-10-CM | POA: Diagnosis not present

## 2022-04-12 DIAGNOSIS — I480 Paroxysmal atrial fibrillation: Secondary | ICD-10-CM | POA: Diagnosis not present

## 2022-04-12 DIAGNOSIS — J9622 Acute and chronic respiratory failure with hypercapnia: Secondary | ICD-10-CM | POA: Diagnosis not present

## 2022-04-13 DIAGNOSIS — J9622 Acute and chronic respiratory failure with hypercapnia: Secondary | ICD-10-CM | POA: Diagnosis not present

## 2022-04-13 DIAGNOSIS — G8191 Hemiplegia, unspecified affecting right dominant side: Secondary | ICD-10-CM | POA: Diagnosis not present

## 2022-04-13 DIAGNOSIS — I480 Paroxysmal atrial fibrillation: Secondary | ICD-10-CM | POA: Diagnosis not present

## 2022-04-13 DIAGNOSIS — J9621 Acute and chronic respiratory failure with hypoxia: Secondary | ICD-10-CM | POA: Diagnosis not present

## 2022-04-13 DIAGNOSIS — A419 Sepsis, unspecified organism: Secondary | ICD-10-CM | POA: Diagnosis not present

## 2022-04-13 DIAGNOSIS — J690 Pneumonitis due to inhalation of food and vomit: Secondary | ICD-10-CM | POA: Diagnosis not present

## 2022-04-17 DIAGNOSIS — A419 Sepsis, unspecified organism: Secondary | ICD-10-CM | POA: Diagnosis not present

## 2022-04-17 DIAGNOSIS — J690 Pneumonitis due to inhalation of food and vomit: Secondary | ICD-10-CM | POA: Diagnosis not present

## 2022-04-17 DIAGNOSIS — J9622 Acute and chronic respiratory failure with hypercapnia: Secondary | ICD-10-CM | POA: Diagnosis not present

## 2022-04-17 DIAGNOSIS — J9621 Acute and chronic respiratory failure with hypoxia: Secondary | ICD-10-CM | POA: Diagnosis not present

## 2022-04-17 DIAGNOSIS — G8191 Hemiplegia, unspecified affecting right dominant side: Secondary | ICD-10-CM | POA: Diagnosis not present

## 2022-04-17 DIAGNOSIS — I480 Paroxysmal atrial fibrillation: Secondary | ICD-10-CM | POA: Diagnosis not present

## 2022-04-19 DIAGNOSIS — J690 Pneumonitis due to inhalation of food and vomit: Secondary | ICD-10-CM | POA: Diagnosis not present

## 2022-04-19 DIAGNOSIS — G8191 Hemiplegia, unspecified affecting right dominant side: Secondary | ICD-10-CM | POA: Diagnosis not present

## 2022-04-19 DIAGNOSIS — I480 Paroxysmal atrial fibrillation: Secondary | ICD-10-CM | POA: Diagnosis not present

## 2022-04-19 DIAGNOSIS — J9621 Acute and chronic respiratory failure with hypoxia: Secondary | ICD-10-CM | POA: Diagnosis not present

## 2022-04-19 DIAGNOSIS — J9622 Acute and chronic respiratory failure with hypercapnia: Secondary | ICD-10-CM | POA: Diagnosis not present

## 2022-04-19 DIAGNOSIS — A419 Sepsis, unspecified organism: Secondary | ICD-10-CM | POA: Diagnosis not present

## 2022-04-21 DIAGNOSIS — J9622 Acute and chronic respiratory failure with hypercapnia: Secondary | ICD-10-CM | POA: Diagnosis not present

## 2022-04-21 DIAGNOSIS — R7301 Impaired fasting glucose: Secondary | ICD-10-CM | POA: Diagnosis not present

## 2022-04-21 DIAGNOSIS — K2100 Gastro-esophageal reflux disease with esophagitis, without bleeding: Secondary | ICD-10-CM | POA: Diagnosis not present

## 2022-04-21 DIAGNOSIS — C61 Malignant neoplasm of prostate: Secondary | ICD-10-CM | POA: Diagnosis not present

## 2022-04-21 DIAGNOSIS — Z8701 Personal history of pneumonia (recurrent): Secondary | ICD-10-CM | POA: Diagnosis not present

## 2022-04-21 DIAGNOSIS — I480 Paroxysmal atrial fibrillation: Secondary | ICD-10-CM | POA: Diagnosis not present

## 2022-04-21 DIAGNOSIS — E559 Vitamin D deficiency, unspecified: Secondary | ICD-10-CM | POA: Diagnosis not present

## 2022-04-21 DIAGNOSIS — Z7901 Long term (current) use of anticoagulants: Secondary | ICD-10-CM | POA: Diagnosis not present

## 2022-04-21 DIAGNOSIS — J9621 Acute and chronic respiratory failure with hypoxia: Secondary | ICD-10-CM | POA: Diagnosis not present

## 2022-04-21 DIAGNOSIS — G8191 Hemiplegia, unspecified affecting right dominant side: Secondary | ICD-10-CM | POA: Diagnosis not present

## 2022-04-21 DIAGNOSIS — N1832 Chronic kidney disease, stage 3b: Secondary | ICD-10-CM | POA: Diagnosis not present

## 2022-04-21 DIAGNOSIS — I129 Hypertensive chronic kidney disease with stage 1 through stage 4 chronic kidney disease, or unspecified chronic kidney disease: Secondary | ICD-10-CM | POA: Diagnosis not present

## 2022-04-21 DIAGNOSIS — E785 Hyperlipidemia, unspecified: Secondary | ICD-10-CM | POA: Diagnosis not present

## 2022-04-21 DIAGNOSIS — S069XAD Unspecified intracranial injury with loss of consciousness status unknown, subsequent encounter: Secondary | ICD-10-CM | POA: Diagnosis not present

## 2022-04-25 DIAGNOSIS — G8191 Hemiplegia, unspecified affecting right dominant side: Secondary | ICD-10-CM | POA: Diagnosis not present

## 2022-04-25 DIAGNOSIS — K2100 Gastro-esophageal reflux disease with esophagitis, without bleeding: Secondary | ICD-10-CM | POA: Diagnosis not present

## 2022-04-25 DIAGNOSIS — N1832 Chronic kidney disease, stage 3b: Secondary | ICD-10-CM | POA: Diagnosis not present

## 2022-04-25 DIAGNOSIS — C61 Malignant neoplasm of prostate: Secondary | ICD-10-CM | POA: Diagnosis not present

## 2022-04-25 DIAGNOSIS — I129 Hypertensive chronic kidney disease with stage 1 through stage 4 chronic kidney disease, or unspecified chronic kidney disease: Secondary | ICD-10-CM | POA: Diagnosis not present

## 2022-04-25 DIAGNOSIS — S069XAD Unspecified intracranial injury with loss of consciousness status unknown, subsequent encounter: Secondary | ICD-10-CM | POA: Diagnosis not present

## 2022-04-28 DIAGNOSIS — K2100 Gastro-esophageal reflux disease with esophagitis, without bleeding: Secondary | ICD-10-CM | POA: Diagnosis not present

## 2022-04-28 DIAGNOSIS — G8191 Hemiplegia, unspecified affecting right dominant side: Secondary | ICD-10-CM | POA: Diagnosis not present

## 2022-04-28 DIAGNOSIS — I129 Hypertensive chronic kidney disease with stage 1 through stage 4 chronic kidney disease, or unspecified chronic kidney disease: Secondary | ICD-10-CM | POA: Diagnosis not present

## 2022-04-28 DIAGNOSIS — C61 Malignant neoplasm of prostate: Secondary | ICD-10-CM | POA: Diagnosis not present

## 2022-04-28 DIAGNOSIS — N1832 Chronic kidney disease, stage 3b: Secondary | ICD-10-CM | POA: Diagnosis not present

## 2022-04-28 DIAGNOSIS — S069XAD Unspecified intracranial injury with loss of consciousness status unknown, subsequent encounter: Secondary | ICD-10-CM | POA: Diagnosis not present

## 2022-05-01 DIAGNOSIS — C61 Malignant neoplasm of prostate: Secondary | ICD-10-CM | POA: Diagnosis not present

## 2022-05-01 DIAGNOSIS — K2100 Gastro-esophageal reflux disease with esophagitis, without bleeding: Secondary | ICD-10-CM | POA: Diagnosis not present

## 2022-05-01 DIAGNOSIS — I129 Hypertensive chronic kidney disease with stage 1 through stage 4 chronic kidney disease, or unspecified chronic kidney disease: Secondary | ICD-10-CM | POA: Diagnosis not present

## 2022-05-01 DIAGNOSIS — N1832 Chronic kidney disease, stage 3b: Secondary | ICD-10-CM | POA: Diagnosis not present

## 2022-05-01 DIAGNOSIS — S069XAD Unspecified intracranial injury with loss of consciousness status unknown, subsequent encounter: Secondary | ICD-10-CM | POA: Diagnosis not present

## 2022-05-01 DIAGNOSIS — G8191 Hemiplegia, unspecified affecting right dominant side: Secondary | ICD-10-CM | POA: Diagnosis not present

## 2022-05-03 DIAGNOSIS — N1832 Chronic kidney disease, stage 3b: Secondary | ICD-10-CM | POA: Diagnosis not present

## 2022-05-03 DIAGNOSIS — C61 Malignant neoplasm of prostate: Secondary | ICD-10-CM | POA: Diagnosis not present

## 2022-05-03 DIAGNOSIS — I129 Hypertensive chronic kidney disease with stage 1 through stage 4 chronic kidney disease, or unspecified chronic kidney disease: Secondary | ICD-10-CM | POA: Diagnosis not present

## 2022-05-03 DIAGNOSIS — K2100 Gastro-esophageal reflux disease with esophagitis, without bleeding: Secondary | ICD-10-CM | POA: Diagnosis not present

## 2022-05-03 DIAGNOSIS — S069XAD Unspecified intracranial injury with loss of consciousness status unknown, subsequent encounter: Secondary | ICD-10-CM | POA: Diagnosis not present

## 2022-05-03 DIAGNOSIS — G8191 Hemiplegia, unspecified affecting right dominant side: Secondary | ICD-10-CM | POA: Diagnosis not present

## 2022-05-08 DIAGNOSIS — C61 Malignant neoplasm of prostate: Secondary | ICD-10-CM | POA: Diagnosis not present

## 2022-05-08 DIAGNOSIS — K2100 Gastro-esophageal reflux disease with esophagitis, without bleeding: Secondary | ICD-10-CM | POA: Diagnosis not present

## 2022-05-08 DIAGNOSIS — G8191 Hemiplegia, unspecified affecting right dominant side: Secondary | ICD-10-CM | POA: Diagnosis not present

## 2022-05-08 DIAGNOSIS — I129 Hypertensive chronic kidney disease with stage 1 through stage 4 chronic kidney disease, or unspecified chronic kidney disease: Secondary | ICD-10-CM | POA: Diagnosis not present

## 2022-05-08 DIAGNOSIS — N1832 Chronic kidney disease, stage 3b: Secondary | ICD-10-CM | POA: Diagnosis not present

## 2022-05-08 DIAGNOSIS — S069XAD Unspecified intracranial injury with loss of consciousness status unknown, subsequent encounter: Secondary | ICD-10-CM | POA: Diagnosis not present

## 2022-05-09 ENCOUNTER — Ambulatory Visit: Payer: Medicare Other | Attending: Cardiology | Admitting: *Deleted

## 2022-05-09 DIAGNOSIS — Z5181 Encounter for therapeutic drug level monitoring: Secondary | ICD-10-CM | POA: Diagnosis not present

## 2022-05-09 DIAGNOSIS — I4891 Unspecified atrial fibrillation: Secondary | ICD-10-CM | POA: Insufficient documentation

## 2022-05-09 LAB — POCT INR: INR: 3 (ref 2.0–3.0)

## 2022-05-09 NOTE — Patient Instructions (Signed)
Continue warfarin 1 1/2 tablets daily except 2 tablets on Sundays and Thursdays Recheck in 4 weeks in [place] office.   

## 2022-05-10 DIAGNOSIS — N1832 Chronic kidney disease, stage 3b: Secondary | ICD-10-CM | POA: Diagnosis not present

## 2022-05-10 DIAGNOSIS — I129 Hypertensive chronic kidney disease with stage 1 through stage 4 chronic kidney disease, or unspecified chronic kidney disease: Secondary | ICD-10-CM | POA: Diagnosis not present

## 2022-05-10 DIAGNOSIS — K2100 Gastro-esophageal reflux disease with esophagitis, without bleeding: Secondary | ICD-10-CM | POA: Diagnosis not present

## 2022-05-10 DIAGNOSIS — G8191 Hemiplegia, unspecified affecting right dominant side: Secondary | ICD-10-CM | POA: Diagnosis not present

## 2022-05-10 DIAGNOSIS — S069XAD Unspecified intracranial injury with loss of consciousness status unknown, subsequent encounter: Secondary | ICD-10-CM | POA: Diagnosis not present

## 2022-05-10 DIAGNOSIS — C61 Malignant neoplasm of prostate: Secondary | ICD-10-CM | POA: Diagnosis not present

## 2022-05-11 DIAGNOSIS — I739 Peripheral vascular disease, unspecified: Secondary | ICD-10-CM | POA: Diagnosis not present

## 2022-05-11 DIAGNOSIS — B351 Tinea unguium: Secondary | ICD-10-CM | POA: Diagnosis not present

## 2022-05-11 DIAGNOSIS — G8190 Hemiplegia, unspecified affecting unspecified side: Secondary | ICD-10-CM | POA: Diagnosis not present

## 2022-05-13 DIAGNOSIS — K2100 Gastro-esophageal reflux disease with esophagitis, without bleeding: Secondary | ICD-10-CM | POA: Diagnosis not present

## 2022-05-13 DIAGNOSIS — C61 Malignant neoplasm of prostate: Secondary | ICD-10-CM | POA: Diagnosis not present

## 2022-05-13 DIAGNOSIS — G8191 Hemiplegia, unspecified affecting right dominant side: Secondary | ICD-10-CM | POA: Diagnosis not present

## 2022-05-13 DIAGNOSIS — S069XAD Unspecified intracranial injury with loss of consciousness status unknown, subsequent encounter: Secondary | ICD-10-CM | POA: Diagnosis not present

## 2022-05-13 DIAGNOSIS — I129 Hypertensive chronic kidney disease with stage 1 through stage 4 chronic kidney disease, or unspecified chronic kidney disease: Secondary | ICD-10-CM | POA: Diagnosis not present

## 2022-05-13 DIAGNOSIS — N1832 Chronic kidney disease, stage 3b: Secondary | ICD-10-CM | POA: Diagnosis not present

## 2022-05-18 DIAGNOSIS — K2100 Gastro-esophageal reflux disease with esophagitis, without bleeding: Secondary | ICD-10-CM | POA: Diagnosis not present

## 2022-05-18 DIAGNOSIS — G8191 Hemiplegia, unspecified affecting right dominant side: Secondary | ICD-10-CM | POA: Diagnosis not present

## 2022-05-18 DIAGNOSIS — C61 Malignant neoplasm of prostate: Secondary | ICD-10-CM | POA: Diagnosis not present

## 2022-05-18 DIAGNOSIS — N1832 Chronic kidney disease, stage 3b: Secondary | ICD-10-CM | POA: Diagnosis not present

## 2022-05-18 DIAGNOSIS — S069XAD Unspecified intracranial injury with loss of consciousness status unknown, subsequent encounter: Secondary | ICD-10-CM | POA: Diagnosis not present

## 2022-05-18 DIAGNOSIS — I129 Hypertensive chronic kidney disease with stage 1 through stage 4 chronic kidney disease, or unspecified chronic kidney disease: Secondary | ICD-10-CM | POA: Diagnosis not present

## 2022-05-19 DIAGNOSIS — G8191 Hemiplegia, unspecified affecting right dominant side: Secondary | ICD-10-CM | POA: Diagnosis not present

## 2022-05-19 DIAGNOSIS — S069XAD Unspecified intracranial injury with loss of consciousness status unknown, subsequent encounter: Secondary | ICD-10-CM | POA: Diagnosis not present

## 2022-05-19 DIAGNOSIS — I129 Hypertensive chronic kidney disease with stage 1 through stage 4 chronic kidney disease, or unspecified chronic kidney disease: Secondary | ICD-10-CM | POA: Diagnosis not present

## 2022-05-19 DIAGNOSIS — K2100 Gastro-esophageal reflux disease with esophagitis, without bleeding: Secondary | ICD-10-CM | POA: Diagnosis not present

## 2022-05-19 DIAGNOSIS — N1832 Chronic kidney disease, stage 3b: Secondary | ICD-10-CM | POA: Diagnosis not present

## 2022-05-19 DIAGNOSIS — C61 Malignant neoplasm of prostate: Secondary | ICD-10-CM | POA: Diagnosis not present

## 2022-05-21 DIAGNOSIS — N1832 Chronic kidney disease, stage 3b: Secondary | ICD-10-CM | POA: Diagnosis not present

## 2022-05-21 DIAGNOSIS — Z8701 Personal history of pneumonia (recurrent): Secondary | ICD-10-CM | POA: Diagnosis not present

## 2022-05-21 DIAGNOSIS — I129 Hypertensive chronic kidney disease with stage 1 through stage 4 chronic kidney disease, or unspecified chronic kidney disease: Secondary | ICD-10-CM | POA: Diagnosis not present

## 2022-05-21 DIAGNOSIS — S069XAD Unspecified intracranial injury with loss of consciousness status unknown, subsequent encounter: Secondary | ICD-10-CM | POA: Diagnosis not present

## 2022-05-21 DIAGNOSIS — E785 Hyperlipidemia, unspecified: Secondary | ICD-10-CM | POA: Diagnosis not present

## 2022-05-21 DIAGNOSIS — R7301 Impaired fasting glucose: Secondary | ICD-10-CM | POA: Diagnosis not present

## 2022-05-21 DIAGNOSIS — J9621 Acute and chronic respiratory failure with hypoxia: Secondary | ICD-10-CM | POA: Diagnosis not present

## 2022-05-21 DIAGNOSIS — J9622 Acute and chronic respiratory failure with hypercapnia: Secondary | ICD-10-CM | POA: Diagnosis not present

## 2022-05-21 DIAGNOSIS — Z7901 Long term (current) use of anticoagulants: Secondary | ICD-10-CM | POA: Diagnosis not present

## 2022-05-21 DIAGNOSIS — E559 Vitamin D deficiency, unspecified: Secondary | ICD-10-CM | POA: Diagnosis not present

## 2022-05-21 DIAGNOSIS — I480 Paroxysmal atrial fibrillation: Secondary | ICD-10-CM | POA: Diagnosis not present

## 2022-05-21 DIAGNOSIS — K2100 Gastro-esophageal reflux disease with esophagitis, without bleeding: Secondary | ICD-10-CM | POA: Diagnosis not present

## 2022-05-21 DIAGNOSIS — C61 Malignant neoplasm of prostate: Secondary | ICD-10-CM | POA: Diagnosis not present

## 2022-05-21 DIAGNOSIS — G8191 Hemiplegia, unspecified affecting right dominant side: Secondary | ICD-10-CM | POA: Diagnosis not present

## 2022-05-23 DIAGNOSIS — C61 Malignant neoplasm of prostate: Secondary | ICD-10-CM | POA: Diagnosis not present

## 2022-05-23 DIAGNOSIS — N1832 Chronic kidney disease, stage 3b: Secondary | ICD-10-CM | POA: Diagnosis not present

## 2022-05-23 DIAGNOSIS — I129 Hypertensive chronic kidney disease with stage 1 through stage 4 chronic kidney disease, or unspecified chronic kidney disease: Secondary | ICD-10-CM | POA: Diagnosis not present

## 2022-05-23 DIAGNOSIS — S069XAD Unspecified intracranial injury with loss of consciousness status unknown, subsequent encounter: Secondary | ICD-10-CM | POA: Diagnosis not present

## 2022-05-23 DIAGNOSIS — G8191 Hemiplegia, unspecified affecting right dominant side: Secondary | ICD-10-CM | POA: Diagnosis not present

## 2022-05-23 DIAGNOSIS — K2100 Gastro-esophageal reflux disease with esophagitis, without bleeding: Secondary | ICD-10-CM | POA: Diagnosis not present

## 2022-05-29 DIAGNOSIS — K2100 Gastro-esophageal reflux disease with esophagitis, without bleeding: Secondary | ICD-10-CM | POA: Diagnosis not present

## 2022-05-29 DIAGNOSIS — C61 Malignant neoplasm of prostate: Secondary | ICD-10-CM | POA: Diagnosis not present

## 2022-05-29 DIAGNOSIS — G8191 Hemiplegia, unspecified affecting right dominant side: Secondary | ICD-10-CM | POA: Diagnosis not present

## 2022-05-29 DIAGNOSIS — N1832 Chronic kidney disease, stage 3b: Secondary | ICD-10-CM | POA: Diagnosis not present

## 2022-05-29 DIAGNOSIS — I129 Hypertensive chronic kidney disease with stage 1 through stage 4 chronic kidney disease, or unspecified chronic kidney disease: Secondary | ICD-10-CM | POA: Diagnosis not present

## 2022-05-29 DIAGNOSIS — S069XAD Unspecified intracranial injury with loss of consciousness status unknown, subsequent encounter: Secondary | ICD-10-CM | POA: Diagnosis not present

## 2022-05-30 DIAGNOSIS — C61 Malignant neoplasm of prostate: Secondary | ICD-10-CM | POA: Diagnosis not present

## 2022-05-30 DIAGNOSIS — K2100 Gastro-esophageal reflux disease with esophagitis, without bleeding: Secondary | ICD-10-CM | POA: Diagnosis not present

## 2022-05-30 DIAGNOSIS — I129 Hypertensive chronic kidney disease with stage 1 through stage 4 chronic kidney disease, or unspecified chronic kidney disease: Secondary | ICD-10-CM | POA: Diagnosis not present

## 2022-05-30 DIAGNOSIS — G8191 Hemiplegia, unspecified affecting right dominant side: Secondary | ICD-10-CM | POA: Diagnosis not present

## 2022-05-30 DIAGNOSIS — S069XAD Unspecified intracranial injury with loss of consciousness status unknown, subsequent encounter: Secondary | ICD-10-CM | POA: Diagnosis not present

## 2022-05-30 DIAGNOSIS — N1832 Chronic kidney disease, stage 3b: Secondary | ICD-10-CM | POA: Diagnosis not present

## 2022-06-08 ENCOUNTER — Ambulatory Visit: Payer: Medicare Other | Attending: Cardiology | Admitting: *Deleted

## 2022-06-08 DIAGNOSIS — I4891 Unspecified atrial fibrillation: Secondary | ICD-10-CM | POA: Diagnosis not present

## 2022-06-08 DIAGNOSIS — Z5181 Encounter for therapeutic drug level monitoring: Secondary | ICD-10-CM | POA: Diagnosis not present

## 2022-06-08 LAB — POCT INR: POC INR: 3.1

## 2022-06-08 NOTE — Patient Instructions (Signed)
Description   Take 1/2 a tablet of warfarin today and then continue warfarin 1 1/2 tablets daily except 2 tablets on Sundays and Thursdays Recheck in 4 weeks in [place] office.

## 2022-06-09 DIAGNOSIS — N1832 Chronic kidney disease, stage 3b: Secondary | ICD-10-CM | POA: Diagnosis not present

## 2022-06-09 DIAGNOSIS — I129 Hypertensive chronic kidney disease with stage 1 through stage 4 chronic kidney disease, or unspecified chronic kidney disease: Secondary | ICD-10-CM | POA: Diagnosis not present

## 2022-06-09 DIAGNOSIS — C61 Malignant neoplasm of prostate: Secondary | ICD-10-CM | POA: Diagnosis not present

## 2022-06-09 DIAGNOSIS — K2100 Gastro-esophageal reflux disease with esophagitis, without bleeding: Secondary | ICD-10-CM | POA: Diagnosis not present

## 2022-06-09 DIAGNOSIS — S069XAD Unspecified intracranial injury with loss of consciousness status unknown, subsequent encounter: Secondary | ICD-10-CM | POA: Diagnosis not present

## 2022-06-09 DIAGNOSIS — G8191 Hemiplegia, unspecified affecting right dominant side: Secondary | ICD-10-CM | POA: Diagnosis not present

## 2022-06-15 DIAGNOSIS — S069XAD Unspecified intracranial injury with loss of consciousness status unknown, subsequent encounter: Secondary | ICD-10-CM | POA: Diagnosis not present

## 2022-06-15 DIAGNOSIS — N1832 Chronic kidney disease, stage 3b: Secondary | ICD-10-CM | POA: Diagnosis not present

## 2022-06-15 DIAGNOSIS — G8191 Hemiplegia, unspecified affecting right dominant side: Secondary | ICD-10-CM | POA: Diagnosis not present

## 2022-06-15 DIAGNOSIS — K2100 Gastro-esophageal reflux disease with esophagitis, without bleeding: Secondary | ICD-10-CM | POA: Diagnosis not present

## 2022-06-15 DIAGNOSIS — I129 Hypertensive chronic kidney disease with stage 1 through stage 4 chronic kidney disease, or unspecified chronic kidney disease: Secondary | ICD-10-CM | POA: Diagnosis not present

## 2022-06-15 DIAGNOSIS — C61 Malignant neoplasm of prostate: Secondary | ICD-10-CM | POA: Diagnosis not present

## 2022-06-20 DIAGNOSIS — Z8546 Personal history of malignant neoplasm of prostate: Secondary | ICD-10-CM | POA: Diagnosis not present

## 2022-06-20 DIAGNOSIS — J9611 Chronic respiratory failure with hypoxia: Secondary | ICD-10-CM | POA: Diagnosis not present

## 2022-06-20 DIAGNOSIS — G8191 Hemiplegia, unspecified affecting right dominant side: Secondary | ICD-10-CM | POA: Diagnosis not present

## 2022-06-20 DIAGNOSIS — S069XAD Unspecified intracranial injury with loss of consciousness status unknown, subsequent encounter: Secondary | ICD-10-CM | POA: Diagnosis not present

## 2022-06-20 DIAGNOSIS — J9612 Chronic respiratory failure with hypercapnia: Secondary | ICD-10-CM | POA: Diagnosis not present

## 2022-06-20 DIAGNOSIS — Z87891 Personal history of nicotine dependence: Secondary | ICD-10-CM | POA: Diagnosis not present

## 2022-06-20 DIAGNOSIS — I480 Paroxysmal atrial fibrillation: Secondary | ICD-10-CM | POA: Diagnosis not present

## 2022-06-20 DIAGNOSIS — N1832 Chronic kidney disease, stage 3b: Secondary | ICD-10-CM | POA: Diagnosis not present

## 2022-06-20 DIAGNOSIS — I129 Hypertensive chronic kidney disease with stage 1 through stage 4 chronic kidney disease, or unspecified chronic kidney disease: Secondary | ICD-10-CM | POA: Diagnosis not present

## 2022-06-20 DIAGNOSIS — Z7901 Long term (current) use of anticoagulants: Secondary | ICD-10-CM | POA: Diagnosis not present

## 2022-06-23 DIAGNOSIS — J9611 Chronic respiratory failure with hypoxia: Secondary | ICD-10-CM | POA: Diagnosis not present

## 2022-06-23 DIAGNOSIS — S069XAD Unspecified intracranial injury with loss of consciousness status unknown, subsequent encounter: Secondary | ICD-10-CM | POA: Diagnosis not present

## 2022-06-23 DIAGNOSIS — G8191 Hemiplegia, unspecified affecting right dominant side: Secondary | ICD-10-CM | POA: Diagnosis not present

## 2022-06-23 DIAGNOSIS — N1832 Chronic kidney disease, stage 3b: Secondary | ICD-10-CM | POA: Diagnosis not present

## 2022-06-23 DIAGNOSIS — I129 Hypertensive chronic kidney disease with stage 1 through stage 4 chronic kidney disease, or unspecified chronic kidney disease: Secondary | ICD-10-CM | POA: Diagnosis not present

## 2022-06-23 DIAGNOSIS — J9612 Chronic respiratory failure with hypercapnia: Secondary | ICD-10-CM | POA: Diagnosis not present

## 2022-06-26 ENCOUNTER — Other Ambulatory Visit: Payer: Self-pay | Admitting: Internal Medicine

## 2022-06-27 DIAGNOSIS — J9611 Chronic respiratory failure with hypoxia: Secondary | ICD-10-CM | POA: Diagnosis not present

## 2022-06-27 DIAGNOSIS — S069XAD Unspecified intracranial injury with loss of consciousness status unknown, subsequent encounter: Secondary | ICD-10-CM | POA: Diagnosis not present

## 2022-06-27 DIAGNOSIS — G8191 Hemiplegia, unspecified affecting right dominant side: Secondary | ICD-10-CM | POA: Diagnosis not present

## 2022-06-27 DIAGNOSIS — N1832 Chronic kidney disease, stage 3b: Secondary | ICD-10-CM | POA: Diagnosis not present

## 2022-06-27 DIAGNOSIS — J9612 Chronic respiratory failure with hypercapnia: Secondary | ICD-10-CM | POA: Diagnosis not present

## 2022-06-27 DIAGNOSIS — I129 Hypertensive chronic kidney disease with stage 1 through stage 4 chronic kidney disease, or unspecified chronic kidney disease: Secondary | ICD-10-CM | POA: Diagnosis not present

## 2022-07-05 ENCOUNTER — Other Ambulatory Visit: Payer: Self-pay | Admitting: Cardiology

## 2022-07-05 DIAGNOSIS — I4891 Unspecified atrial fibrillation: Secondary | ICD-10-CM

## 2022-07-05 DIAGNOSIS — I129 Hypertensive chronic kidney disease with stage 1 through stage 4 chronic kidney disease, or unspecified chronic kidney disease: Secondary | ICD-10-CM | POA: Diagnosis not present

## 2022-07-05 DIAGNOSIS — N1832 Chronic kidney disease, stage 3b: Secondary | ICD-10-CM | POA: Diagnosis not present

## 2022-07-05 DIAGNOSIS — J9611 Chronic respiratory failure with hypoxia: Secondary | ICD-10-CM | POA: Diagnosis not present

## 2022-07-05 DIAGNOSIS — J9612 Chronic respiratory failure with hypercapnia: Secondary | ICD-10-CM | POA: Diagnosis not present

## 2022-07-05 DIAGNOSIS — S069XAD Unspecified intracranial injury with loss of consciousness status unknown, subsequent encounter: Secondary | ICD-10-CM | POA: Diagnosis not present

## 2022-07-05 DIAGNOSIS — G8191 Hemiplegia, unspecified affecting right dominant side: Secondary | ICD-10-CM | POA: Diagnosis not present

## 2022-07-10 ENCOUNTER — Ambulatory Visit: Payer: Medicare Other | Attending: Cardiology | Admitting: *Deleted

## 2022-07-10 DIAGNOSIS — G8191 Hemiplegia, unspecified affecting right dominant side: Secondary | ICD-10-CM | POA: Diagnosis not present

## 2022-07-10 DIAGNOSIS — I4891 Unspecified atrial fibrillation: Secondary | ICD-10-CM | POA: Diagnosis not present

## 2022-07-10 DIAGNOSIS — J9612 Chronic respiratory failure with hypercapnia: Secondary | ICD-10-CM | POA: Diagnosis not present

## 2022-07-10 DIAGNOSIS — I129 Hypertensive chronic kidney disease with stage 1 through stage 4 chronic kidney disease, or unspecified chronic kidney disease: Secondary | ICD-10-CM | POA: Diagnosis not present

## 2022-07-10 DIAGNOSIS — J9611 Chronic respiratory failure with hypoxia: Secondary | ICD-10-CM | POA: Diagnosis not present

## 2022-07-10 DIAGNOSIS — Z5181 Encounter for therapeutic drug level monitoring: Secondary | ICD-10-CM | POA: Diagnosis not present

## 2022-07-10 DIAGNOSIS — N1832 Chronic kidney disease, stage 3b: Secondary | ICD-10-CM | POA: Diagnosis not present

## 2022-07-10 DIAGNOSIS — S069XAD Unspecified intracranial injury with loss of consciousness status unknown, subsequent encounter: Secondary | ICD-10-CM | POA: Diagnosis not present

## 2022-07-10 LAB — POCT INR: INR: 3 (ref 2.0–3.0)

## 2022-07-10 NOTE — Patient Instructions (Signed)
Continue warfarin 1 1/2 tablets daily except 2 tablets on Sundays and Thursdays Recheck in 6 weeks in [place] office.

## 2022-07-11 ENCOUNTER — Telehealth: Payer: Self-pay | Admitting: Internal Medicine

## 2022-07-11 DIAGNOSIS — Z8679 Personal history of other diseases of the circulatory system: Secondary | ICD-10-CM

## 2022-07-11 DIAGNOSIS — S069X4S Unspecified intracranial injury with loss of consciousness of 6 hours to 24 hours, sequela: Secondary | ICD-10-CM

## 2022-07-11 DIAGNOSIS — R269 Unspecified abnormalities of gait and mobility: Secondary | ICD-10-CM

## 2022-07-11 DIAGNOSIS — G8114 Spastic hemiplegia affecting left nondominant side: Secondary | ICD-10-CM

## 2022-07-11 NOTE — Telephone Encounter (Signed)
Tim from [employer] is requesting we send a DME order for a semi-electric hospital bed & a level 2 pressure mattress.   Please call Tim with any questions: [phone]

## 2022-07-12 NOTE — Telephone Encounter (Signed)
Form faxed

## 2022-07-12 NOTE — Telephone Encounter (Signed)
Ok done hardcopy to [facility] 

## 2022-07-13 ENCOUNTER — Telehealth: Payer: Self-pay | Admitting: Internal Medicine

## 2022-07-13 NOTE — Telephone Encounter (Signed)
Faxed last two OV notes to [facility] [employer]...Raechel Chute

## 2022-07-13 NOTE — Telephone Encounter (Signed)
Aggie Cosier from [employer] called and said they received an order for a semi electric hospital bed and low air mattress. She said they still need some notes for the patient like his height and weight. She said she will fax over the request today. Best callback is [phone].

## 2022-07-17 NOTE — Telephone Encounter (Signed)
A representative from [employer] called and said they would like a call back for further information regarding the order for the bed and mattress.  Best callback is [phone].

## 2022-07-18 DIAGNOSIS — I129 Hypertensive chronic kidney disease with stage 1 through stage 4 chronic kidney disease, or unspecified chronic kidney disease: Secondary | ICD-10-CM | POA: Diagnosis not present

## 2022-07-18 DIAGNOSIS — N1832 Chronic kidney disease, stage 3b: Secondary | ICD-10-CM | POA: Diagnosis not present

## 2022-07-18 DIAGNOSIS — G8191 Hemiplegia, unspecified affecting right dominant side: Secondary | ICD-10-CM | POA: Diagnosis not present

## 2022-07-18 DIAGNOSIS — J9612 Chronic respiratory failure with hypercapnia: Secondary | ICD-10-CM | POA: Diagnosis not present

## 2022-07-18 DIAGNOSIS — S069XAD Unspecified intracranial injury with loss of consciousness status unknown, subsequent encounter: Secondary | ICD-10-CM | POA: Diagnosis not present

## 2022-07-18 DIAGNOSIS — J9611 Chronic respiratory failure with hypoxia: Secondary | ICD-10-CM | POA: Diagnosis not present

## 2022-07-20 DIAGNOSIS — J9612 Chronic respiratory failure with hypercapnia: Secondary | ICD-10-CM | POA: Diagnosis not present

## 2022-07-20 DIAGNOSIS — B351 Tinea unguium: Secondary | ICD-10-CM | POA: Diagnosis not present

## 2022-07-20 DIAGNOSIS — J9611 Chronic respiratory failure with hypoxia: Secondary | ICD-10-CM | POA: Diagnosis not present

## 2022-07-20 DIAGNOSIS — S069XAD Unspecified intracranial injury with loss of consciousness status unknown, subsequent encounter: Secondary | ICD-10-CM | POA: Diagnosis not present

## 2022-07-20 DIAGNOSIS — Z7901 Long term (current) use of anticoagulants: Secondary | ICD-10-CM | POA: Diagnosis not present

## 2022-07-20 DIAGNOSIS — G8190 Hemiplegia, unspecified affecting unspecified side: Secondary | ICD-10-CM | POA: Diagnosis not present

## 2022-07-20 DIAGNOSIS — N1832 Chronic kidney disease, stage 3b: Secondary | ICD-10-CM | POA: Diagnosis not present

## 2022-07-20 DIAGNOSIS — Z87891 Personal history of nicotine dependence: Secondary | ICD-10-CM | POA: Diagnosis not present

## 2022-07-20 DIAGNOSIS — I739 Peripheral vascular disease, unspecified: Secondary | ICD-10-CM | POA: Diagnosis not present

## 2022-07-20 DIAGNOSIS — G8191 Hemiplegia, unspecified affecting right dominant side: Secondary | ICD-10-CM | POA: Diagnosis not present

## 2022-07-20 DIAGNOSIS — Z8546 Personal history of malignant neoplasm of prostate: Secondary | ICD-10-CM | POA: Diagnosis not present

## 2022-07-20 DIAGNOSIS — I129 Hypertensive chronic kidney disease with stage 1 through stage 4 chronic kidney disease, or unspecified chronic kidney disease: Secondary | ICD-10-CM | POA: Diagnosis not present

## 2022-07-20 DIAGNOSIS — I480 Paroxysmal atrial fibrillation: Secondary | ICD-10-CM | POA: Diagnosis not present

## 2022-07-25 DIAGNOSIS — I129 Hypertensive chronic kidney disease with stage 1 through stage 4 chronic kidney disease, or unspecified chronic kidney disease: Secondary | ICD-10-CM | POA: Diagnosis not present

## 2022-07-25 DIAGNOSIS — J9611 Chronic respiratory failure with hypoxia: Secondary | ICD-10-CM | POA: Diagnosis not present

## 2022-07-25 DIAGNOSIS — J9612 Chronic respiratory failure with hypercapnia: Secondary | ICD-10-CM | POA: Diagnosis not present

## 2022-07-25 DIAGNOSIS — S069XAD Unspecified intracranial injury with loss of consciousness status unknown, subsequent encounter: Secondary | ICD-10-CM | POA: Diagnosis not present

## 2022-07-25 DIAGNOSIS — N1832 Chronic kidney disease, stage 3b: Secondary | ICD-10-CM | POA: Diagnosis not present

## 2022-07-25 DIAGNOSIS — G8191 Hemiplegia, unspecified affecting right dominant side: Secondary | ICD-10-CM | POA: Diagnosis not present

## 2022-08-02 DIAGNOSIS — J9611 Chronic respiratory failure with hypoxia: Secondary | ICD-10-CM | POA: Diagnosis not present

## 2022-08-02 DIAGNOSIS — N1832 Chronic kidney disease, stage 3b: Secondary | ICD-10-CM | POA: Diagnosis not present

## 2022-08-02 DIAGNOSIS — J9612 Chronic respiratory failure with hypercapnia: Secondary | ICD-10-CM | POA: Diagnosis not present

## 2022-08-02 DIAGNOSIS — I129 Hypertensive chronic kidney disease with stage 1 through stage 4 chronic kidney disease, or unspecified chronic kidney disease: Secondary | ICD-10-CM | POA: Diagnosis not present

## 2022-08-02 DIAGNOSIS — S069XAD Unspecified intracranial injury with loss of consciousness status unknown, subsequent encounter: Secondary | ICD-10-CM | POA: Diagnosis not present

## 2022-08-02 DIAGNOSIS — G8191 Hemiplegia, unspecified affecting right dominant side: Secondary | ICD-10-CM | POA: Diagnosis not present

## 2022-08-11 ENCOUNTER — Telehealth: Payer: Self-pay | Admitting: Internal Medicine

## 2022-08-11 DIAGNOSIS — S069X4S Unspecified intracranial injury with loss of consciousness of 6 hours to 24 hours, sequela: Secondary | ICD-10-CM

## 2022-08-11 DIAGNOSIS — N1832 Chronic kidney disease, stage 3b: Secondary | ICD-10-CM | POA: Diagnosis not present

## 2022-08-11 DIAGNOSIS — S069XAD Unspecified intracranial injury with loss of consciousness status unknown, subsequent encounter: Secondary | ICD-10-CM | POA: Diagnosis not present

## 2022-08-11 DIAGNOSIS — G8114 Spastic hemiplegia affecting left nondominant side: Secondary | ICD-10-CM

## 2022-08-11 DIAGNOSIS — G8191 Hemiplegia, unspecified affecting right dominant side: Secondary | ICD-10-CM | POA: Diagnosis not present

## 2022-08-11 DIAGNOSIS — R269 Unspecified abnormalities of gait and mobility: Secondary | ICD-10-CM

## 2022-08-11 DIAGNOSIS — J9612 Chronic respiratory failure with hypercapnia: Secondary | ICD-10-CM | POA: Diagnosis not present

## 2022-08-11 DIAGNOSIS — J9611 Chronic respiratory failure with hypoxia: Secondary | ICD-10-CM | POA: Diagnosis not present

## 2022-08-11 DIAGNOSIS — I129 Hypertensive chronic kidney disease with stage 1 through stage 4 chronic kidney disease, or unspecified chronic kidney disease: Secondary | ICD-10-CM | POA: Diagnosis not present

## 2022-08-11 NOTE — Telephone Encounter (Signed)
Austin [name] from [facility] came in to OV.  Pt need limited bed mobility secondary to paralysis and semi electric hospital bed with gel overlay mattress.

## 2022-08-14 NOTE — Telephone Encounter (Signed)
Ok done hardcopy to [facility] 

## 2022-08-15 NOTE — Telephone Encounter (Signed)
Order faxed.

## 2022-08-16 DIAGNOSIS — J9612 Chronic respiratory failure with hypercapnia: Secondary | ICD-10-CM | POA: Diagnosis not present

## 2022-08-16 DIAGNOSIS — J9611 Chronic respiratory failure with hypoxia: Secondary | ICD-10-CM | POA: Diagnosis not present

## 2022-08-16 DIAGNOSIS — G8191 Hemiplegia, unspecified affecting right dominant side: Secondary | ICD-10-CM | POA: Diagnosis not present

## 2022-08-16 DIAGNOSIS — S069XAD Unspecified intracranial injury with loss of consciousness status unknown, subsequent encounter: Secondary | ICD-10-CM | POA: Diagnosis not present

## 2022-08-16 DIAGNOSIS — I129 Hypertensive chronic kidney disease with stage 1 through stage 4 chronic kidney disease, or unspecified chronic kidney disease: Secondary | ICD-10-CM | POA: Diagnosis not present

## 2022-08-16 DIAGNOSIS — N1832 Chronic kidney disease, stage 3b: Secondary | ICD-10-CM | POA: Diagnosis not present

## 2022-08-19 DIAGNOSIS — Z8701 Personal history of pneumonia (recurrent): Secondary | ICD-10-CM | POA: Diagnosis not present

## 2022-08-19 DIAGNOSIS — G8194 Hemiplegia, unspecified affecting left nondominant side: Secondary | ICD-10-CM | POA: Diagnosis not present

## 2022-08-19 DIAGNOSIS — I480 Paroxysmal atrial fibrillation: Secondary | ICD-10-CM | POA: Diagnosis not present

## 2022-08-19 DIAGNOSIS — K219 Gastro-esophageal reflux disease without esophagitis: Secondary | ICD-10-CM | POA: Diagnosis not present

## 2022-08-19 DIAGNOSIS — E782 Mixed hyperlipidemia: Secondary | ICD-10-CM | POA: Diagnosis not present

## 2022-08-19 DIAGNOSIS — E559 Vitamin D deficiency, unspecified: Secondary | ICD-10-CM | POA: Diagnosis not present

## 2022-08-19 DIAGNOSIS — N1832 Chronic kidney disease, stage 3b: Secondary | ICD-10-CM | POA: Diagnosis not present

## 2022-08-19 DIAGNOSIS — Z8546 Personal history of malignant neoplasm of prostate: Secondary | ICD-10-CM | POA: Diagnosis not present

## 2022-08-19 DIAGNOSIS — I129 Hypertensive chronic kidney disease with stage 1 through stage 4 chronic kidney disease, or unspecified chronic kidney disease: Secondary | ICD-10-CM | POA: Diagnosis not present

## 2022-08-19 DIAGNOSIS — Z7901 Long term (current) use of anticoagulants: Secondary | ICD-10-CM | POA: Diagnosis not present

## 2022-08-19 DIAGNOSIS — Z87891 Personal history of nicotine dependence: Secondary | ICD-10-CM | POA: Diagnosis not present

## 2022-08-19 DIAGNOSIS — S069XAD Unspecified intracranial injury with loss of consciousness status unknown, subsequent encounter: Secondary | ICD-10-CM | POA: Diagnosis not present

## 2022-08-19 DIAGNOSIS — J9612 Chronic respiratory failure with hypercapnia: Secondary | ICD-10-CM | POA: Diagnosis not present

## 2022-08-19 DIAGNOSIS — J9611 Chronic respiratory failure with hypoxia: Secondary | ICD-10-CM | POA: Diagnosis not present

## 2022-08-21 ENCOUNTER — Ambulatory Visit: Payer: Medicare Other | Attending: Cardiology | Admitting: *Deleted

## 2022-08-21 DIAGNOSIS — Z5181 Encounter for therapeutic drug level monitoring: Secondary | ICD-10-CM | POA: Diagnosis not present

## 2022-08-21 DIAGNOSIS — I4891 Unspecified atrial fibrillation: Secondary | ICD-10-CM | POA: Diagnosis not present

## 2022-08-21 LAB — POCT INR: INR: 3.6 — AB (ref 2.0–3.0)

## 2022-08-21 NOTE — Patient Instructions (Signed)
Hold warfarin tonight then decrease dose to 1 1/2 tablets daily Recheck in 6 weeks in [place] office.

## 2022-08-23 DIAGNOSIS — I129 Hypertensive chronic kidney disease with stage 1 through stage 4 chronic kidney disease, or unspecified chronic kidney disease: Secondary | ICD-10-CM | POA: Diagnosis not present

## 2022-08-23 DIAGNOSIS — J9612 Chronic respiratory failure with hypercapnia: Secondary | ICD-10-CM | POA: Diagnosis not present

## 2022-08-23 DIAGNOSIS — G8194 Hemiplegia, unspecified affecting left nondominant side: Secondary | ICD-10-CM | POA: Diagnosis not present

## 2022-08-23 DIAGNOSIS — N1832 Chronic kidney disease, stage 3b: Secondary | ICD-10-CM | POA: Diagnosis not present

## 2022-08-23 DIAGNOSIS — S069XAD Unspecified intracranial injury with loss of consciousness status unknown, subsequent encounter: Secondary | ICD-10-CM | POA: Diagnosis not present

## 2022-08-23 DIAGNOSIS — J9611 Chronic respiratory failure with hypoxia: Secondary | ICD-10-CM | POA: Diagnosis not present

## 2022-08-24 DIAGNOSIS — E559 Vitamin D deficiency, unspecified: Secondary | ICD-10-CM | POA: Diagnosis not present

## 2022-08-24 DIAGNOSIS — Z8546 Personal history of malignant neoplasm of prostate: Secondary | ICD-10-CM | POA: Diagnosis not present

## 2022-08-24 DIAGNOSIS — J9612 Chronic respiratory failure with hypercapnia: Secondary | ICD-10-CM | POA: Diagnosis not present

## 2022-08-24 DIAGNOSIS — I129 Hypertensive chronic kidney disease with stage 1 through stage 4 chronic kidney disease, or unspecified chronic kidney disease: Secondary | ICD-10-CM | POA: Diagnosis not present

## 2022-08-24 DIAGNOSIS — E782 Mixed hyperlipidemia: Secondary | ICD-10-CM | POA: Diagnosis not present

## 2022-08-24 DIAGNOSIS — Z87891 Personal history of nicotine dependence: Secondary | ICD-10-CM | POA: Diagnosis not present

## 2022-08-24 DIAGNOSIS — Z7901 Long term (current) use of anticoagulants: Secondary | ICD-10-CM

## 2022-08-24 DIAGNOSIS — K219 Gastro-esophageal reflux disease without esophagitis: Secondary | ICD-10-CM | POA: Diagnosis not present

## 2022-08-24 DIAGNOSIS — J9611 Chronic respiratory failure with hypoxia: Secondary | ICD-10-CM | POA: Diagnosis not present

## 2022-08-24 DIAGNOSIS — G8194 Hemiplegia, unspecified affecting left nondominant side: Secondary | ICD-10-CM | POA: Diagnosis not present

## 2022-08-24 DIAGNOSIS — N1832 Chronic kidney disease, stage 3b: Secondary | ICD-10-CM | POA: Diagnosis not present

## 2022-08-24 DIAGNOSIS — S069XAD Unspecified intracranial injury with loss of consciousness status unknown, subsequent encounter: Secondary | ICD-10-CM | POA: Diagnosis not present

## 2022-08-24 DIAGNOSIS — I480 Paroxysmal atrial fibrillation: Secondary | ICD-10-CM | POA: Diagnosis not present

## 2022-08-24 DIAGNOSIS — Z8701 Personal history of pneumonia (recurrent): Secondary | ICD-10-CM

## 2022-08-31 DIAGNOSIS — I129 Hypertensive chronic kidney disease with stage 1 through stage 4 chronic kidney disease, or unspecified chronic kidney disease: Secondary | ICD-10-CM | POA: Diagnosis not present

## 2022-08-31 DIAGNOSIS — J9612 Chronic respiratory failure with hypercapnia: Secondary | ICD-10-CM | POA: Diagnosis not present

## 2022-08-31 DIAGNOSIS — G8194 Hemiplegia, unspecified affecting left nondominant side: Secondary | ICD-10-CM | POA: Diagnosis not present

## 2022-08-31 DIAGNOSIS — N1832 Chronic kidney disease, stage 3b: Secondary | ICD-10-CM | POA: Diagnosis not present

## 2022-08-31 DIAGNOSIS — S069XAD Unspecified intracranial injury with loss of consciousness status unknown, subsequent encounter: Secondary | ICD-10-CM | POA: Diagnosis not present

## 2022-08-31 DIAGNOSIS — J9611 Chronic respiratory failure with hypoxia: Secondary | ICD-10-CM | POA: Diagnosis not present

## 2022-08-31 NOTE — Telephone Encounter (Signed)
Spoke with [employer] and advanced care directive and they have received both the order and the documentation.   They are now now requesting the provider write a letter of medical necessity that states the following:  Patient needs repositioning of his body in was that is not achievable in an ordinary bed or with a wedge pillow. The purpose of this equipment is to eliminate pain, reduce pressure, and treat any potential pressure sores.   Letter must be signed and dated  Please fax letter to [phone]

## 2022-08-31 NOTE — Telephone Encounter (Signed)
Done hardcopy to cma 

## 2022-09-01 NOTE — Telephone Encounter (Signed)
Faxed letter to [phone]..Rec'd confirmation it was received.Marland KitchenRaechel Chute

## 2022-09-06 ENCOUNTER — Emergency Department (HOSPITAL_COMMUNITY)
Admission: EM | Admit: 2022-09-06 | Discharge: 2022-09-06 | Disposition: A | Discharge status: Home / Self Care | Payer: Medicare Other | Attending: Emergency Medicine | Admitting: Emergency Medicine

## 2022-09-06 ENCOUNTER — Encounter (HOSPITAL_COMMUNITY): Payer: Self-pay

## 2022-09-06 ENCOUNTER — Other Ambulatory Visit: Payer: Self-pay

## 2022-09-06 ENCOUNTER — Emergency Department (HOSPITAL_COMMUNITY): Payer: Medicare Other

## 2022-09-06 DIAGNOSIS — I129 Hypertensive chronic kidney disease with stage 1 through stage 4 chronic kidney disease, or unspecified chronic kidney disease: Secondary | ICD-10-CM | POA: Diagnosis not present

## 2022-09-06 DIAGNOSIS — Z79899 Other long term (current) drug therapy: Secondary | ICD-10-CM | POA: Insufficient documentation

## 2022-09-06 DIAGNOSIS — Z7901 Long term (current) use of anticoagulants: Secondary | ICD-10-CM | POA: Insufficient documentation

## 2022-09-06 DIAGNOSIS — T17908A Unspecified foreign body in respiratory tract, part unspecified causing other injury, initial encounter: Secondary | ICD-10-CM

## 2022-09-06 DIAGNOSIS — K449 Diaphragmatic hernia without obstruction or gangrene: Secondary | ICD-10-CM | POA: Diagnosis not present

## 2022-09-06 DIAGNOSIS — N189 Chronic kidney disease, unspecified: Secondary | ICD-10-CM | POA: Diagnosis not present

## 2022-09-06 DIAGNOSIS — X58XXXA Exposure to other specified factors, initial encounter: Secondary | ICD-10-CM | POA: Insufficient documentation

## 2022-09-06 DIAGNOSIS — T17900A Unspecified foreign body in respiratory tract, part unspecified causing asphyxiation, initial encounter: Secondary | ICD-10-CM | POA: Diagnosis not present

## 2022-09-06 DIAGNOSIS — J690 Pneumonitis due to inhalation of food and vomit: Secondary | ICD-10-CM | POA: Diagnosis not present

## 2022-09-06 DIAGNOSIS — I10 Essential (primary) hypertension: Secondary | ICD-10-CM | POA: Diagnosis not present

## 2022-09-06 DIAGNOSIS — Z03822 Encounter for observation for suspected aspirated (inhaled) foreign body ruled out: Secondary | ICD-10-CM | POA: Diagnosis not present

## 2022-09-06 DIAGNOSIS — T17920A Food in respiratory tract, part unspecified causing asphyxiation, initial encounter: Secondary | ICD-10-CM | POA: Diagnosis not present
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
## 2022-09-06 NOTE — Discharge Instructions (Signed)
Pleasure taking care of you.  You were seen tonight after a choking episode.  Your chest x-ray is normal, your oxygen is normal and your heart rate is also normal.  This is all very reassuring.  If you develop fever or productive cough, or shortness of breath or any other worsening symptoms please come back to the ER, otherwise can follow close with your primary care doctor.

## 2022-09-06 NOTE — ED Triage Notes (Signed)
EMS bib pt from home with c/o "concerned for aspiration after getting on corn about 45 minutes ago". Pt told EMS he was able to clear throat himself, but only liquid came up" pt denies pain and sob, "just wants to be checked for aspiration." Per EMS lung sound clear and equal bilaterally. Last vitals per EMS BP- 136/88, HR -111, 97% on room air.

## 2022-09-06 NOTE — ED Notes (Signed)
Pt alert and oriented upon DC. He was assisted into the vehicle by this RN and family member. Bsuper RN

## 2022-09-08 DIAGNOSIS — S069XAD Unspecified intracranial injury with loss of consciousness status unknown, subsequent encounter: Secondary | ICD-10-CM | POA: Diagnosis not present

## 2022-09-08 DIAGNOSIS — I129 Hypertensive chronic kidney disease with stage 1 through stage 4 chronic kidney disease, or unspecified chronic kidney disease: Secondary | ICD-10-CM | POA: Diagnosis not present

## 2022-09-08 DIAGNOSIS — J9612 Chronic respiratory failure with hypercapnia: Secondary | ICD-10-CM | POA: Diagnosis not present

## 2022-09-08 DIAGNOSIS — J9611 Chronic respiratory failure with hypoxia: Secondary | ICD-10-CM | POA: Diagnosis not present

## 2022-09-08 DIAGNOSIS — G8194 Hemiplegia, unspecified affecting left nondominant side: Secondary | ICD-10-CM | POA: Diagnosis not present

## 2022-09-08 DIAGNOSIS — N1832 Chronic kidney disease, stage 3b: Secondary | ICD-10-CM | POA: Diagnosis not present

## 2022-09-12 DIAGNOSIS — J9612 Chronic respiratory failure with hypercapnia: Secondary | ICD-10-CM | POA: Diagnosis not present

## 2022-09-12 DIAGNOSIS — N1832 Chronic kidney disease, stage 3b: Secondary | ICD-10-CM | POA: Diagnosis not present

## 2022-09-12 DIAGNOSIS — S069XAD Unspecified intracranial injury with loss of consciousness status unknown, subsequent encounter: Secondary | ICD-10-CM | POA: Diagnosis not present

## 2022-09-12 DIAGNOSIS — I129 Hypertensive chronic kidney disease with stage 1 through stage 4 chronic kidney disease, or unspecified chronic kidney disease: Secondary | ICD-10-CM | POA: Diagnosis not present

## 2022-09-12 DIAGNOSIS — G8194 Hemiplegia, unspecified affecting left nondominant side: Secondary | ICD-10-CM | POA: Diagnosis not present

## 2022-09-12 DIAGNOSIS — J9611 Chronic respiratory failure with hypoxia: Secondary | ICD-10-CM | POA: Diagnosis not present

## 2022-09-13 ENCOUNTER — Telehealth: Payer: Self-pay

## 2022-09-13 NOTE — Telephone Encounter (Signed)
Transition Care Management Unsuccessful Follow-up Telephone Call  Date of discharge and from where:  09/06/2022 [facility]  Attempts:  2nd Attempt  Reason for unsuccessful TCM follow-up call:  Left voice message  Sofia Jaquith [facility]  [facility] Population Health Community Resource Care Guide   [email or web address]  ?? [phone]   Website: [email or web address]  [email or web address]

## 2022-09-13 NOTE — Telephone Encounter (Signed)
Transition Care Management Unsuccessful Follow-up Telephone Call  Date of discharge and from where:  09/06/2022 [facility]  Attempts:  1st Attempt  Reason for unsuccessful TCM follow-up call:  Unable to leave message  Cherylynn Liszewski [facility]  [facility] Population Health Community Resource Care Guide   [email or web address]  ?? [phone]   Website: [email or web address]  [email or web address]

## 2022-09-18 ENCOUNTER — Other Ambulatory Visit: Payer: Self-pay | Admitting: Cardiology

## 2022-09-18 DIAGNOSIS — I129 Hypertensive chronic kidney disease with stage 1 through stage 4 chronic kidney disease, or unspecified chronic kidney disease: Secondary | ICD-10-CM | POA: Diagnosis not present

## 2022-09-18 DIAGNOSIS — N1832 Chronic kidney disease, stage 3b: Secondary | ICD-10-CM | POA: Diagnosis not present

## 2022-09-18 DIAGNOSIS — J9611 Chronic respiratory failure with hypoxia: Secondary | ICD-10-CM | POA: Diagnosis not present

## 2022-09-18 DIAGNOSIS — Z8701 Personal history of pneumonia (recurrent): Secondary | ICD-10-CM | POA: Diagnosis not present

## 2022-09-18 DIAGNOSIS — Z7901 Long term (current) use of anticoagulants: Secondary | ICD-10-CM | POA: Diagnosis not present

## 2022-09-18 DIAGNOSIS — K219 Gastro-esophageal reflux disease without esophagitis: Secondary | ICD-10-CM | POA: Diagnosis not present

## 2022-09-18 DIAGNOSIS — E782 Mixed hyperlipidemia: Secondary | ICD-10-CM | POA: Diagnosis not present

## 2022-09-18 DIAGNOSIS — G8194 Hemiplegia, unspecified affecting left nondominant side: Secondary | ICD-10-CM | POA: Diagnosis not present

## 2022-09-18 DIAGNOSIS — E559 Vitamin D deficiency, unspecified: Secondary | ICD-10-CM | POA: Diagnosis not present

## 2022-09-18 DIAGNOSIS — J9612 Chronic respiratory failure with hypercapnia: Secondary | ICD-10-CM | POA: Diagnosis not present

## 2022-09-18 DIAGNOSIS — I4891 Unspecified atrial fibrillation: Secondary | ICD-10-CM

## 2022-09-18 DIAGNOSIS — S069XAD Unspecified intracranial injury with loss of consciousness status unknown, subsequent encounter: Secondary | ICD-10-CM | POA: Diagnosis not present

## 2022-09-18 DIAGNOSIS — Z87891 Personal history of nicotine dependence: Secondary | ICD-10-CM | POA: Diagnosis not present

## 2022-09-18 DIAGNOSIS — I480 Paroxysmal atrial fibrillation: Secondary | ICD-10-CM | POA: Diagnosis not present

## 2022-09-18 DIAGNOSIS — Z8546 Personal history of malignant neoplasm of prostate: Secondary | ICD-10-CM | POA: Diagnosis not present

## 2022-09-18 NOTE — Telephone Encounter (Signed)
Refill request for warfarin:  Last INR was 3.6 on 08/21/22 Next INR due 10/02/22 LOV was 06/15/22  Refill approved.

## 2022-09-21 DIAGNOSIS — J9612 Chronic respiratory failure with hypercapnia: Secondary | ICD-10-CM | POA: Diagnosis not present

## 2022-09-21 DIAGNOSIS — J9611 Chronic respiratory failure with hypoxia: Secondary | ICD-10-CM | POA: Diagnosis not present

## 2022-09-21 DIAGNOSIS — N1832 Chronic kidney disease, stage 3b: Secondary | ICD-10-CM | POA: Diagnosis not present

## 2022-09-21 DIAGNOSIS — S069XAD Unspecified intracranial injury with loss of consciousness status unknown, subsequent encounter: Secondary | ICD-10-CM | POA: Diagnosis not present

## 2022-09-21 DIAGNOSIS — I129 Hypertensive chronic kidney disease with stage 1 through stage 4 chronic kidney disease, or unspecified chronic kidney disease: Secondary | ICD-10-CM | POA: Diagnosis not present

## 2022-09-21 DIAGNOSIS — G8194 Hemiplegia, unspecified affecting left nondominant side: Secondary | ICD-10-CM | POA: Diagnosis not present

## 2022-09-26 DIAGNOSIS — H43813 Vitreous degeneration, bilateral: Secondary | ICD-10-CM | POA: Diagnosis not present

## 2022-09-26 DIAGNOSIS — H40013 Open angle with borderline findings, low risk, bilateral: Secondary | ICD-10-CM | POA: Diagnosis not present

## 2022-09-26 DIAGNOSIS — H26492 Other secondary cataract, left eye: Secondary | ICD-10-CM | POA: Diagnosis not present

## 2022-09-26 DIAGNOSIS — H4720 Unspecified optic atrophy: Secondary | ICD-10-CM | POA: Diagnosis not present

## 2022-09-28 DIAGNOSIS — I739 Peripheral vascular disease, unspecified: Secondary | ICD-10-CM | POA: Diagnosis not present

## 2022-09-28 DIAGNOSIS — G8190 Hemiplegia, unspecified affecting unspecified side: Secondary | ICD-10-CM | POA: Diagnosis not present

## 2022-09-28 DIAGNOSIS — B351 Tinea unguium: Secondary | ICD-10-CM | POA: Diagnosis not present

## 2022-09-30 DIAGNOSIS — I129 Hypertensive chronic kidney disease with stage 1 through stage 4 chronic kidney disease, or unspecified chronic kidney disease: Secondary | ICD-10-CM | POA: Diagnosis not present

## 2022-09-30 DIAGNOSIS — S069XAD Unspecified intracranial injury with loss of consciousness status unknown, subsequent encounter: Secondary | ICD-10-CM | POA: Diagnosis not present

## 2022-09-30 DIAGNOSIS — N1832 Chronic kidney disease, stage 3b: Secondary | ICD-10-CM | POA: Diagnosis not present

## 2022-09-30 DIAGNOSIS — G8194 Hemiplegia, unspecified affecting left nondominant side: Secondary | ICD-10-CM | POA: Diagnosis not present

## 2022-09-30 DIAGNOSIS — J9612 Chronic respiratory failure with hypercapnia: Secondary | ICD-10-CM | POA: Diagnosis not present

## 2022-09-30 DIAGNOSIS — J9611 Chronic respiratory failure with hypoxia: Secondary | ICD-10-CM | POA: Diagnosis not present

## 2022-10-02 ENCOUNTER — Ambulatory Visit: Payer: Medicare Other | Attending: Cardiology | Admitting: *Deleted

## 2022-10-02 DIAGNOSIS — I4891 Unspecified atrial fibrillation: Secondary | ICD-10-CM

## 2022-10-02 DIAGNOSIS — Z5181 Encounter for therapeutic drug level monitoring: Secondary | ICD-10-CM

## 2022-10-02 LAB — POCT INR: INR: 2.4 (ref 2.0–3.0)

## 2022-10-02 NOTE — Patient Instructions (Signed)
Continue warfarin 1 1/2 tablets daily  ?Recheck in 6 weeks in [place] office.   ?

## 2022-10-04 ENCOUNTER — Ambulatory Visit (INDEPENDENT_AMBULATORY_CARE_PROVIDER_SITE_OTHER): Payer: Medicare Other | Admitting: Internal Medicine

## 2022-10-04 ENCOUNTER — Encounter: Payer: Self-pay | Admitting: Internal Medicine

## 2022-10-04 VITALS — BP 136/82 | HR 76 | Temp 98.2°F | Ht 67.0 in

## 2022-10-04 DIAGNOSIS — N1832 Chronic kidney disease, stage 3b: Secondary | ICD-10-CM

## 2022-10-04 DIAGNOSIS — I10 Essential (primary) hypertension: Secondary | ICD-10-CM | POA: Diagnosis not present

## 2022-10-04 DIAGNOSIS — E782 Mixed hyperlipidemia: Secondary | ICD-10-CM

## 2022-10-04 DIAGNOSIS — J309 Allergic rhinitis, unspecified: Secondary | ICD-10-CM | POA: Diagnosis not present

## 2022-10-04 DIAGNOSIS — R739 Hyperglycemia, unspecified: Secondary | ICD-10-CM | POA: Diagnosis not present

## 2022-10-04 DIAGNOSIS — E538 Deficiency of other specified B group vitamins: Secondary | ICD-10-CM

## 2022-10-04 DIAGNOSIS — E559 Vitamin D deficiency, unspecified: Secondary | ICD-10-CM

## 2022-10-04 LAB — HEPATIC FUNCTION PANEL
ALT: 11 U/L (ref 0–53)
AST: 14 U/L (ref 0–37)
Albumin: 4.1 g/dL (ref 3.5–5.2)
Alkaline Phosphatase: 55 U/L (ref 39–117)
Bilirubin, Direct: 0.1 mg/dL (ref 0.0–0.3)
Total Bilirubin: 0.6 mg/dL (ref 0.2–1.2)
Total Protein: 7.3 g/dL (ref 6.0–8.3)

## 2022-10-04 LAB — VITAMIN B12: Vitamin B-12: >1501 pg/mL — ABNORMAL HIGH (ref 211–911)

## 2022-10-04 LAB — LIPID PANEL
Cholesterol: 155 mg/dL (ref 0–200)
HDL: 42.6 mg/dL (ref >39.00)
LDL Cholesterol: 66 mg/dL (ref 0–99)
NonHDL: 112.54
Total CHOL/HDL Ratio: 4
Triglycerides: 233 mg/dL — ABNORMAL HIGH (ref 0.0–149.0)
VLDL: 46.6 mg/dL — ABNORMAL HIGH (ref 0.0–40.0)

## 2022-10-04 LAB — BASIC METABOLIC PANEL
BUN: 20 mg/dL (ref 6–23)
CO2: 25 meq/L (ref 19–32)
Calcium: 9.6 mg/dL (ref 8.4–10.5)
Chloride: 108 meq/L (ref 96–112)
Creatinine, Ser: 1.58 mg/dL — ABNORMAL HIGH (ref 0.40–1.50)
GFR: 39.03 mL/min — ABNORMAL LOW (ref >60.00)
Glucose, Bld: 88 mg/dL (ref 70–99)
Potassium: 3.8 meq/L (ref 3.5–5.1)
Sodium: 140 meq/L (ref 135–145)

## 2022-10-04 LAB — HEMOGLOBIN A1C: Hgb A1c MFr Bld: 5.9 % (ref 4.6–6.5)

## 2022-10-04 LAB — VITAMIN D 25 HYDROXY (VIT D DEFICIENCY, FRACTURES): VITD: 73.63 ng/mL (ref 30.00–100.00)

## 2022-10-04 MED ORDER — PREDNISONE 10 MG PO TABS: ORAL_TABLET | ORAL | 0 refills | Status: DC

## 2022-10-04 NOTE — Patient Instructions (Signed)
Please take all new medication as prescribed- the prednisone  Please continue all other medications as before, and refills have been done if requested.  Please have the pharmacy call with any other refills you may need.  Please continue your efforts at being more active, low cholesterol diet, and weight control  Please keep your appointments with your specialists as you may have planned  Please go to the LAB at the blood drawing area for the tests to be done  You will be contacted by phone if any changes need to be made immediately.  Otherwise, you will receive a letter about your results with an explanation, but please check with MyChart first.  Please remember to sign up for MyChart if you have not done so, as this will be important to you in the future with finding out test results, communicating by private email, and scheduling acute appointments online when needed.  Please make an Appointment to return in 6 months, or sooner if needed 
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
## 2022-10-05 DIAGNOSIS — S069XAD Unspecified intracranial injury with loss of consciousness status unknown, subsequent encounter: Secondary | ICD-10-CM | POA: Diagnosis not present

## 2022-10-05 DIAGNOSIS — J9612 Chronic respiratory failure with hypercapnia: Secondary | ICD-10-CM | POA: Diagnosis not present

## 2022-10-05 DIAGNOSIS — J9611 Chronic respiratory failure with hypoxia: Secondary | ICD-10-CM | POA: Diagnosis not present

## 2022-10-05 DIAGNOSIS — I129 Hypertensive chronic kidney disease with stage 1 through stage 4 chronic kidney disease, or unspecified chronic kidney disease: Secondary | ICD-10-CM | POA: Diagnosis not present

## 2022-10-05 DIAGNOSIS — N1832 Chronic kidney disease, stage 3b: Secondary | ICD-10-CM | POA: Diagnosis not present

## 2022-10-05 DIAGNOSIS — G8194 Hemiplegia, unspecified affecting left nondominant side: Secondary | ICD-10-CM | POA: Diagnosis not present

## 2022-10-07 ENCOUNTER — Encounter: Payer: Self-pay | Admitting: Internal Medicine

## 2022-10-07 NOTE — Assessment & Plan Note (Signed)
Lab Results  Component Value Date   LDLCALC 66 10/04/2022   Stable, pt to continue current statin crestor 20 qd

## 2022-10-07 NOTE — Assessment & Plan Note (Signed)
Lab Results  Component Value Date   CREATININE 1.58 (H) 10/04/2022   Stable overall, cont to avoid nephrotoxins

## 2022-10-07 NOTE — Assessment & Plan Note (Signed)
Lab Results  Component Value Date   VITAMINB12 >1501 (H) 10/04/2022   Stable, cont oral replacement - b12 1000 mcg qd

## 2022-10-07 NOTE — Assessment & Plan Note (Signed)
BP Readings from Last 3 Encounters:  10/04/22 136/82  09/06/22 (!) 160/76  03/29/22 138/64   Stable, pt to continue medical treatment norvasc 5 qd

## 2022-10-07 NOTE — Assessment & Plan Note (Signed)
Mild to mod flare, for prednisone taper,  to f/u any worsening symptoms or concerns

## 2022-10-07 NOTE — Assessment & Plan Note (Signed)
Last vitamin D Lab Results  Component Value Date   VD25OH 73.63 10/04/2022   Stable, cont oral replacement

## 2022-10-07 NOTE — Assessment & Plan Note (Signed)
Lab Results  Component Value Date   HGBA1C 5.9 10/04/2022   Stable, pt to continue current medical treatment  - diet, wt control

## 2022-10-13 DIAGNOSIS — N1832 Chronic kidney disease, stage 3b: Secondary | ICD-10-CM | POA: Diagnosis not present

## 2022-10-13 DIAGNOSIS — G8194 Hemiplegia, unspecified affecting left nondominant side: Secondary | ICD-10-CM | POA: Diagnosis not present

## 2022-10-13 DIAGNOSIS — I129 Hypertensive chronic kidney disease with stage 1 through stage 4 chronic kidney disease, or unspecified chronic kidney disease: Secondary | ICD-10-CM | POA: Diagnosis not present

## 2022-10-13 DIAGNOSIS — J9612 Chronic respiratory failure with hypercapnia: Secondary | ICD-10-CM | POA: Diagnosis not present

## 2022-10-13 DIAGNOSIS — S069XAD Unspecified intracranial injury with loss of consciousness status unknown, subsequent encounter: Secondary | ICD-10-CM | POA: Diagnosis not present

## 2022-10-13 DIAGNOSIS — J9611 Chronic respiratory failure with hypoxia: Secondary | ICD-10-CM | POA: Diagnosis not present

## 2022-10-17 DIAGNOSIS — J9611 Chronic respiratory failure with hypoxia: Secondary | ICD-10-CM | POA: Diagnosis not present

## 2022-10-17 DIAGNOSIS — G8194 Hemiplegia, unspecified affecting left nondominant side: Secondary | ICD-10-CM | POA: Diagnosis not present

## 2022-10-17 DIAGNOSIS — S069XAD Unspecified intracranial injury with loss of consciousness status unknown, subsequent encounter: Secondary | ICD-10-CM | POA: Diagnosis not present

## 2022-10-17 DIAGNOSIS — I129 Hypertensive chronic kidney disease with stage 1 through stage 4 chronic kidney disease, or unspecified chronic kidney disease: Secondary | ICD-10-CM | POA: Diagnosis not present

## 2022-10-17 DIAGNOSIS — J9612 Chronic respiratory failure with hypercapnia: Secondary | ICD-10-CM | POA: Diagnosis not present

## 2022-10-17 DIAGNOSIS — N1832 Chronic kidney disease, stage 3b: Secondary | ICD-10-CM | POA: Diagnosis not present

## 2022-10-18 DIAGNOSIS — G8194 Hemiplegia, unspecified affecting left nondominant side: Secondary | ICD-10-CM | POA: Diagnosis not present

## 2022-10-18 DIAGNOSIS — Z8546 Personal history of malignant neoplasm of prostate: Secondary | ICD-10-CM | POA: Diagnosis not present

## 2022-10-18 DIAGNOSIS — S069XAD Unspecified intracranial injury with loss of consciousness status unknown, subsequent encounter: Secondary | ICD-10-CM | POA: Diagnosis not present

## 2022-10-18 DIAGNOSIS — J9612 Chronic respiratory failure with hypercapnia: Secondary | ICD-10-CM | POA: Diagnosis not present

## 2022-10-18 DIAGNOSIS — E782 Mixed hyperlipidemia: Secondary | ICD-10-CM | POA: Diagnosis not present

## 2022-10-18 DIAGNOSIS — J9611 Chronic respiratory failure with hypoxia: Secondary | ICD-10-CM | POA: Diagnosis not present

## 2022-10-18 DIAGNOSIS — E559 Vitamin D deficiency, unspecified: Secondary | ICD-10-CM | POA: Diagnosis not present

## 2022-10-18 DIAGNOSIS — K219 Gastro-esophageal reflux disease without esophagitis: Secondary | ICD-10-CM | POA: Diagnosis not present

## 2022-10-18 DIAGNOSIS — Z8701 Personal history of pneumonia (recurrent): Secondary | ICD-10-CM | POA: Diagnosis not present

## 2022-10-18 DIAGNOSIS — Z87891 Personal history of nicotine dependence: Secondary | ICD-10-CM | POA: Diagnosis not present

## 2022-10-18 DIAGNOSIS — Z7901 Long term (current) use of anticoagulants: Secondary | ICD-10-CM | POA: Diagnosis not present

## 2022-10-18 DIAGNOSIS — I480 Paroxysmal atrial fibrillation: Secondary | ICD-10-CM | POA: Diagnosis not present

## 2022-10-18 DIAGNOSIS — I129 Hypertensive chronic kidney disease with stage 1 through stage 4 chronic kidney disease, or unspecified chronic kidney disease: Secondary | ICD-10-CM | POA: Diagnosis not present

## 2022-10-18 DIAGNOSIS — N1832 Chronic kidney disease, stage 3b: Secondary | ICD-10-CM | POA: Diagnosis not present

## 2022-10-26 DIAGNOSIS — I129 Hypertensive chronic kidney disease with stage 1 through stage 4 chronic kidney disease, or unspecified chronic kidney disease: Secondary | ICD-10-CM | POA: Diagnosis not present

## 2022-10-26 DIAGNOSIS — N1832 Chronic kidney disease, stage 3b: Secondary | ICD-10-CM | POA: Diagnosis not present

## 2022-10-26 DIAGNOSIS — G8194 Hemiplegia, unspecified affecting left nondominant side: Secondary | ICD-10-CM | POA: Diagnosis not present

## 2022-10-26 DIAGNOSIS — S069XAD Unspecified intracranial injury with loss of consciousness status unknown, subsequent encounter: Secondary | ICD-10-CM | POA: Diagnosis not present

## 2022-10-26 DIAGNOSIS — J9611 Chronic respiratory failure with hypoxia: Secondary | ICD-10-CM | POA: Diagnosis not present

## 2022-10-26 DIAGNOSIS — J9612 Chronic respiratory failure with hypercapnia: Secondary | ICD-10-CM | POA: Diagnosis not present

## 2022-10-31 DIAGNOSIS — J9611 Chronic respiratory failure with hypoxia: Secondary | ICD-10-CM | POA: Diagnosis not present

## 2022-10-31 DIAGNOSIS — S069XAD Unspecified intracranial injury with loss of consciousness status unknown, subsequent encounter: Secondary | ICD-10-CM | POA: Diagnosis not present

## 2022-10-31 DIAGNOSIS — I129 Hypertensive chronic kidney disease with stage 1 through stage 4 chronic kidney disease, or unspecified chronic kidney disease: Secondary | ICD-10-CM | POA: Diagnosis not present

## 2022-10-31 DIAGNOSIS — N1832 Chronic kidney disease, stage 3b: Secondary | ICD-10-CM | POA: Diagnosis not present

## 2022-10-31 DIAGNOSIS — J9612 Chronic respiratory failure with hypercapnia: Secondary | ICD-10-CM | POA: Diagnosis not present

## 2022-10-31 DIAGNOSIS — G8194 Hemiplegia, unspecified affecting left nondominant side: Secondary | ICD-10-CM | POA: Diagnosis not present

## 2022-11-08 DIAGNOSIS — I129 Hypertensive chronic kidney disease with stage 1 through stage 4 chronic kidney disease, or unspecified chronic kidney disease: Secondary | ICD-10-CM | POA: Diagnosis not present

## 2022-11-08 DIAGNOSIS — S069XAD Unspecified intracranial injury with loss of consciousness status unknown, subsequent encounter: Secondary | ICD-10-CM | POA: Diagnosis not present

## 2022-11-08 DIAGNOSIS — G8194 Hemiplegia, unspecified affecting left nondominant side: Secondary | ICD-10-CM | POA: Diagnosis not present

## 2022-11-08 DIAGNOSIS — N1832 Chronic kidney disease, stage 3b: Secondary | ICD-10-CM | POA: Diagnosis not present

## 2022-11-08 DIAGNOSIS — J9611 Chronic respiratory failure with hypoxia: Secondary | ICD-10-CM | POA: Diagnosis not present

## 2022-11-08 DIAGNOSIS — J9612 Chronic respiratory failure with hypercapnia: Secondary | ICD-10-CM | POA: Diagnosis not present

## 2022-11-13 ENCOUNTER — Ambulatory Visit: Payer: Medicare Other | Attending: Cardiology | Admitting: *Deleted

## 2022-11-13 DIAGNOSIS — Z5181 Encounter for therapeutic drug level monitoring: Secondary | ICD-10-CM | POA: Diagnosis not present

## 2022-11-13 DIAGNOSIS — I4891 Unspecified atrial fibrillation: Secondary | ICD-10-CM | POA: Diagnosis not present

## 2022-11-13 LAB — POCT INR: INR: 2.6 (ref 2.0–3.0)

## 2022-11-13 NOTE — Patient Instructions (Signed)
Continue warfarin 1 1/2 tablets daily  ?Recheck in 6 weeks in [place] office.   ?

## 2022-11-17 DIAGNOSIS — Z7901 Long term (current) use of anticoagulants: Secondary | ICD-10-CM | POA: Diagnosis not present

## 2022-11-17 DIAGNOSIS — G8194 Hemiplegia, unspecified affecting left nondominant side: Secondary | ICD-10-CM | POA: Diagnosis not present

## 2022-11-17 DIAGNOSIS — J9612 Chronic respiratory failure with hypercapnia: Secondary | ICD-10-CM | POA: Diagnosis not present

## 2022-11-17 DIAGNOSIS — I129 Hypertensive chronic kidney disease with stage 1 through stage 4 chronic kidney disease, or unspecified chronic kidney disease: Secondary | ICD-10-CM | POA: Diagnosis not present

## 2022-11-17 DIAGNOSIS — I480 Paroxysmal atrial fibrillation: Secondary | ICD-10-CM | POA: Diagnosis not present

## 2022-11-17 DIAGNOSIS — J9611 Chronic respiratory failure with hypoxia: Secondary | ICD-10-CM | POA: Diagnosis not present

## 2022-11-17 DIAGNOSIS — Z87891 Personal history of nicotine dependence: Secondary | ICD-10-CM | POA: Diagnosis not present

## 2022-11-17 DIAGNOSIS — Z8701 Personal history of pneumonia (recurrent): Secondary | ICD-10-CM | POA: Diagnosis not present

## 2022-11-17 DIAGNOSIS — N1832 Chronic kidney disease, stage 3b: Secondary | ICD-10-CM | POA: Diagnosis not present

## 2022-11-17 DIAGNOSIS — Z8546 Personal history of malignant neoplasm of prostate: Secondary | ICD-10-CM | POA: Diagnosis not present

## 2022-11-17 DIAGNOSIS — E559 Vitamin D deficiency, unspecified: Secondary | ICD-10-CM | POA: Diagnosis not present

## 2022-11-17 DIAGNOSIS — K219 Gastro-esophageal reflux disease without esophagitis: Secondary | ICD-10-CM | POA: Diagnosis not present

## 2022-11-17 DIAGNOSIS — S069XAD Unspecified intracranial injury with loss of consciousness status unknown, subsequent encounter: Secondary | ICD-10-CM | POA: Diagnosis not present

## 2022-11-17 DIAGNOSIS — E782 Mixed hyperlipidemia: Secondary | ICD-10-CM | POA: Diagnosis not present

## 2022-11-24 DIAGNOSIS — S069XAD Unspecified intracranial injury with loss of consciousness status unknown, subsequent encounter: Secondary | ICD-10-CM | POA: Diagnosis not present

## 2022-11-24 DIAGNOSIS — J9611 Chronic respiratory failure with hypoxia: Secondary | ICD-10-CM | POA: Diagnosis not present

## 2022-11-24 DIAGNOSIS — N1832 Chronic kidney disease, stage 3b: Secondary | ICD-10-CM | POA: Diagnosis not present

## 2022-11-24 DIAGNOSIS — I129 Hypertensive chronic kidney disease with stage 1 through stage 4 chronic kidney disease, or unspecified chronic kidney disease: Secondary | ICD-10-CM | POA: Diagnosis not present

## 2022-11-24 DIAGNOSIS — J9612 Chronic respiratory failure with hypercapnia: Secondary | ICD-10-CM | POA: Diagnosis not present

## 2022-11-24 DIAGNOSIS — G8194 Hemiplegia, unspecified affecting left nondominant side: Secondary | ICD-10-CM | POA: Diagnosis not present

## 2022-11-30 DIAGNOSIS — G8194 Hemiplegia, unspecified affecting left nondominant side: Secondary | ICD-10-CM | POA: Diagnosis not present

## 2022-11-30 DIAGNOSIS — S069XAD Unspecified intracranial injury with loss of consciousness status unknown, subsequent encounter: Secondary | ICD-10-CM | POA: Diagnosis not present

## 2022-11-30 DIAGNOSIS — J9611 Chronic respiratory failure with hypoxia: Secondary | ICD-10-CM | POA: Diagnosis not present

## 2022-11-30 DIAGNOSIS — N1832 Chronic kidney disease, stage 3b: Secondary | ICD-10-CM | POA: Diagnosis not present

## 2022-11-30 DIAGNOSIS — I129 Hypertensive chronic kidney disease with stage 1 through stage 4 chronic kidney disease, or unspecified chronic kidney disease: Secondary | ICD-10-CM | POA: Diagnosis not present

## 2022-11-30 DIAGNOSIS — J9612 Chronic respiratory failure with hypercapnia: Secondary | ICD-10-CM | POA: Diagnosis not present

## 2022-12-06 DIAGNOSIS — N1832 Chronic kidney disease, stage 3b: Secondary | ICD-10-CM | POA: Diagnosis not present

## 2022-12-07 DIAGNOSIS — I739 Peripheral vascular disease, unspecified: Secondary | ICD-10-CM | POA: Diagnosis not present

## 2022-12-07 DIAGNOSIS — G8190 Hemiplegia, unspecified affecting unspecified side: Secondary | ICD-10-CM | POA: Diagnosis not present

## 2022-12-07 DIAGNOSIS — B351 Tinea unguium: Secondary | ICD-10-CM | POA: Diagnosis not present

## 2022-12-08 DIAGNOSIS — I129 Hypertensive chronic kidney disease with stage 1 through stage 4 chronic kidney disease, or unspecified chronic kidney disease: Secondary | ICD-10-CM | POA: Diagnosis not present

## 2022-12-08 DIAGNOSIS — S069XAD Unspecified intracranial injury with loss of consciousness status unknown, subsequent encounter: Secondary | ICD-10-CM | POA: Diagnosis not present

## 2022-12-08 DIAGNOSIS — J9612 Chronic respiratory failure with hypercapnia: Secondary | ICD-10-CM | POA: Diagnosis not present

## 2022-12-08 DIAGNOSIS — G8194 Hemiplegia, unspecified affecting left nondominant side: Secondary | ICD-10-CM | POA: Diagnosis not present

## 2022-12-08 DIAGNOSIS — N1832 Chronic kidney disease, stage 3b: Secondary | ICD-10-CM | POA: Diagnosis not present

## 2022-12-08 DIAGNOSIS — J9611 Chronic respiratory failure with hypoxia: Secondary | ICD-10-CM | POA: Diagnosis not present

## 2022-12-12 DIAGNOSIS — N184 Chronic kidney disease, stage 4 (severe): Secondary | ICD-10-CM | POA: Diagnosis not present

## 2022-12-12 DIAGNOSIS — D631 Anemia in chronic kidney disease: Secondary | ICD-10-CM | POA: Diagnosis not present

## 2022-12-12 DIAGNOSIS — I129 Hypertensive chronic kidney disease with stage 1 through stage 4 chronic kidney disease, or unspecified chronic kidney disease: Secondary | ICD-10-CM | POA: Diagnosis not present

## 2022-12-12 DIAGNOSIS — E870 Hyperosmolality and hypernatremia: Secondary | ICD-10-CM | POA: Diagnosis not present

## 2022-12-12 DIAGNOSIS — N2581 Secondary hyperparathyroidism of renal origin: Secondary | ICD-10-CM | POA: Diagnosis not present

## 2022-12-13 DIAGNOSIS — N1832 Chronic kidney disease, stage 3b: Secondary | ICD-10-CM | POA: Diagnosis not present

## 2022-12-13 DIAGNOSIS — I129 Hypertensive chronic kidney disease with stage 1 through stage 4 chronic kidney disease, or unspecified chronic kidney disease: Secondary | ICD-10-CM | POA: Diagnosis not present

## 2022-12-13 DIAGNOSIS — G8194 Hemiplegia, unspecified affecting left nondominant side: Secondary | ICD-10-CM | POA: Diagnosis not present

## 2022-12-13 DIAGNOSIS — J9612 Chronic respiratory failure with hypercapnia: Secondary | ICD-10-CM | POA: Diagnosis not present

## 2022-12-13 DIAGNOSIS — J9611 Chronic respiratory failure with hypoxia: Secondary | ICD-10-CM | POA: Diagnosis not present

## 2022-12-13 DIAGNOSIS — S069XAD Unspecified intracranial injury with loss of consciousness status unknown, subsequent encounter: Secondary | ICD-10-CM | POA: Diagnosis not present

## 2022-12-17 DIAGNOSIS — S069XAD Unspecified intracranial injury with loss of consciousness status unknown, subsequent encounter: Secondary | ICD-10-CM | POA: Diagnosis not present

## 2022-12-17 DIAGNOSIS — I480 Paroxysmal atrial fibrillation: Secondary | ICD-10-CM | POA: Diagnosis not present

## 2022-12-17 DIAGNOSIS — Z7901 Long term (current) use of anticoagulants: Secondary | ICD-10-CM | POA: Diagnosis not present

## 2022-12-17 DIAGNOSIS — Z8546 Personal history of malignant neoplasm of prostate: Secondary | ICD-10-CM | POA: Diagnosis not present

## 2022-12-17 DIAGNOSIS — I129 Hypertensive chronic kidney disease with stage 1 through stage 4 chronic kidney disease, or unspecified chronic kidney disease: Secondary | ICD-10-CM | POA: Diagnosis not present

## 2022-12-17 DIAGNOSIS — G8194 Hemiplegia, unspecified affecting left nondominant side: Secondary | ICD-10-CM | POA: Diagnosis not present

## 2022-12-17 DIAGNOSIS — E559 Vitamin D deficiency, unspecified: Secondary | ICD-10-CM | POA: Diagnosis not present

## 2022-12-17 DIAGNOSIS — K219 Gastro-esophageal reflux disease without esophagitis: Secondary | ICD-10-CM | POA: Diagnosis not present

## 2022-12-17 DIAGNOSIS — J9611 Chronic respiratory failure with hypoxia: Secondary | ICD-10-CM | POA: Diagnosis not present

## 2022-12-17 DIAGNOSIS — Z8701 Personal history of pneumonia (recurrent): Secondary | ICD-10-CM | POA: Diagnosis not present

## 2022-12-17 DIAGNOSIS — J9612 Chronic respiratory failure with hypercapnia: Secondary | ICD-10-CM | POA: Diagnosis not present

## 2022-12-17 DIAGNOSIS — N1832 Chronic kidney disease, stage 3b: Secondary | ICD-10-CM | POA: Diagnosis not present

## 2022-12-17 DIAGNOSIS — Z87891 Personal history of nicotine dependence: Secondary | ICD-10-CM | POA: Diagnosis not present

## 2022-12-17 DIAGNOSIS — E782 Mixed hyperlipidemia: Secondary | ICD-10-CM | POA: Diagnosis not present

## 2022-12-23 DIAGNOSIS — I129 Hypertensive chronic kidney disease with stage 1 through stage 4 chronic kidney disease, or unspecified chronic kidney disease: Secondary | ICD-10-CM | POA: Diagnosis not present

## 2022-12-23 DIAGNOSIS — S069XAD Unspecified intracranial injury with loss of consciousness status unknown, subsequent encounter: Secondary | ICD-10-CM | POA: Diagnosis not present

## 2022-12-23 DIAGNOSIS — G8194 Hemiplegia, unspecified affecting left nondominant side: Secondary | ICD-10-CM | POA: Diagnosis not present

## 2022-12-23 DIAGNOSIS — J9612 Chronic respiratory failure with hypercapnia: Secondary | ICD-10-CM | POA: Diagnosis not present

## 2022-12-23 DIAGNOSIS — I480 Paroxysmal atrial fibrillation: Secondary | ICD-10-CM | POA: Diagnosis not present

## 2022-12-23 DIAGNOSIS — J9611 Chronic respiratory failure with hypoxia: Secondary | ICD-10-CM | POA: Diagnosis not present

## 2022-12-25 ENCOUNTER — Ambulatory Visit: Payer: Medicare Other | Attending: Cardiology | Admitting: *Deleted

## 2022-12-25 DIAGNOSIS — Z5181 Encounter for therapeutic drug level monitoring: Secondary | ICD-10-CM

## 2022-12-25 DIAGNOSIS — I4891 Unspecified atrial fibrillation: Secondary | ICD-10-CM | POA: Diagnosis not present

## 2022-12-25 LAB — POCT INR: INR: 2.6 (ref 2.0–3.0)

## 2022-12-25 NOTE — Patient Instructions (Signed)
Continue warfarin 1 1/2 tablets daily  ?Recheck in 6 weeks in [place] office.   ?

## 2022-12-29 DIAGNOSIS — I129 Hypertensive chronic kidney disease with stage 1 through stage 4 chronic kidney disease, or unspecified chronic kidney disease: Secondary | ICD-10-CM | POA: Diagnosis not present

## 2022-12-29 DIAGNOSIS — J9612 Chronic respiratory failure with hypercapnia: Secondary | ICD-10-CM | POA: Diagnosis not present

## 2022-12-29 DIAGNOSIS — J9611 Chronic respiratory failure with hypoxia: Secondary | ICD-10-CM | POA: Diagnosis not present

## 2022-12-29 DIAGNOSIS — G8194 Hemiplegia, unspecified affecting left nondominant side: Secondary | ICD-10-CM | POA: Diagnosis not present

## 2022-12-29 DIAGNOSIS — I480 Paroxysmal atrial fibrillation: Secondary | ICD-10-CM | POA: Diagnosis not present

## 2022-12-29 DIAGNOSIS — S069XAD Unspecified intracranial injury with loss of consciousness status unknown, subsequent encounter: Secondary | ICD-10-CM | POA: Diagnosis not present

## 2023-01-02 DIAGNOSIS — N184 Chronic kidney disease, stage 4 (severe): Secondary | ICD-10-CM | POA: Diagnosis not present

## 2023-01-11 DIAGNOSIS — G8194 Hemiplegia, unspecified affecting left nondominant side: Secondary | ICD-10-CM | POA: Diagnosis not present

## 2023-01-11 DIAGNOSIS — I129 Hypertensive chronic kidney disease with stage 1 through stage 4 chronic kidney disease, or unspecified chronic kidney disease: Secondary | ICD-10-CM | POA: Diagnosis not present

## 2023-01-11 DIAGNOSIS — I480 Paroxysmal atrial fibrillation: Secondary | ICD-10-CM | POA: Diagnosis not present

## 2023-01-11 DIAGNOSIS — J9611 Chronic respiratory failure with hypoxia: Secondary | ICD-10-CM | POA: Diagnosis not present

## 2023-01-11 DIAGNOSIS — J9612 Chronic respiratory failure with hypercapnia: Secondary | ICD-10-CM | POA: Diagnosis not present

## 2023-01-11 DIAGNOSIS — S069XAD Unspecified intracranial injury with loss of consciousness status unknown, subsequent encounter: Secondary | ICD-10-CM | POA: Diagnosis not present

## 2023-01-16 DIAGNOSIS — E782 Mixed hyperlipidemia: Secondary | ICD-10-CM | POA: Diagnosis not present

## 2023-01-16 DIAGNOSIS — I129 Hypertensive chronic kidney disease with stage 1 through stage 4 chronic kidney disease, or unspecified chronic kidney disease: Secondary | ICD-10-CM | POA: Diagnosis not present

## 2023-01-16 DIAGNOSIS — J9612 Chronic respiratory failure with hypercapnia: Secondary | ICD-10-CM | POA: Diagnosis not present

## 2023-01-16 DIAGNOSIS — N1832 Chronic kidney disease, stage 3b: Secondary | ICD-10-CM | POA: Diagnosis not present

## 2023-01-16 DIAGNOSIS — Z8546 Personal history of malignant neoplasm of prostate: Secondary | ICD-10-CM | POA: Diagnosis not present

## 2023-01-16 DIAGNOSIS — Z8701 Personal history of pneumonia (recurrent): Secondary | ICD-10-CM | POA: Diagnosis not present

## 2023-01-16 DIAGNOSIS — S069XAD Unspecified intracranial injury with loss of consciousness status unknown, subsequent encounter: Secondary | ICD-10-CM | POA: Diagnosis not present

## 2023-01-16 DIAGNOSIS — G8194 Hemiplegia, unspecified affecting left nondominant side: Secondary | ICD-10-CM | POA: Diagnosis not present

## 2023-01-16 DIAGNOSIS — J9611 Chronic respiratory failure with hypoxia: Secondary | ICD-10-CM | POA: Diagnosis not present

## 2023-01-16 DIAGNOSIS — K219 Gastro-esophageal reflux disease without esophagitis: Secondary | ICD-10-CM | POA: Diagnosis not present

## 2023-01-16 DIAGNOSIS — I480 Paroxysmal atrial fibrillation: Secondary | ICD-10-CM | POA: Diagnosis not present

## 2023-01-16 DIAGNOSIS — Z87891 Personal history of nicotine dependence: Secondary | ICD-10-CM | POA: Diagnosis not present

## 2023-01-16 DIAGNOSIS — E559 Vitamin D deficiency, unspecified: Secondary | ICD-10-CM | POA: Diagnosis not present

## 2023-01-16 DIAGNOSIS — Z7901 Long term (current) use of anticoagulants: Secondary | ICD-10-CM | POA: Diagnosis not present

## 2023-01-19 DIAGNOSIS — J9612 Chronic respiratory failure with hypercapnia: Secondary | ICD-10-CM | POA: Diagnosis not present

## 2023-01-19 DIAGNOSIS — J9611 Chronic respiratory failure with hypoxia: Secondary | ICD-10-CM | POA: Diagnosis not present

## 2023-01-19 DIAGNOSIS — I129 Hypertensive chronic kidney disease with stage 1 through stage 4 chronic kidney disease, or unspecified chronic kidney disease: Secondary | ICD-10-CM | POA: Diagnosis not present

## 2023-01-19 DIAGNOSIS — S069XAD Unspecified intracranial injury with loss of consciousness status unknown, subsequent encounter: Secondary | ICD-10-CM | POA: Diagnosis not present

## 2023-01-19 DIAGNOSIS — G8194 Hemiplegia, unspecified affecting left nondominant side: Secondary | ICD-10-CM | POA: Diagnosis not present

## 2023-01-19 DIAGNOSIS — I480 Paroxysmal atrial fibrillation: Secondary | ICD-10-CM | POA: Diagnosis not present

## 2023-01-26 DIAGNOSIS — J9611 Chronic respiratory failure with hypoxia: Secondary | ICD-10-CM | POA: Diagnosis not present

## 2023-01-26 DIAGNOSIS — G8194 Hemiplegia, unspecified affecting left nondominant side: Secondary | ICD-10-CM | POA: Diagnosis not present

## 2023-01-26 DIAGNOSIS — I129 Hypertensive chronic kidney disease with stage 1 through stage 4 chronic kidney disease, or unspecified chronic kidney disease: Secondary | ICD-10-CM | POA: Diagnosis not present

## 2023-01-26 DIAGNOSIS — J9612 Chronic respiratory failure with hypercapnia: Secondary | ICD-10-CM | POA: Diagnosis not present

## 2023-01-26 DIAGNOSIS — S069XAD Unspecified intracranial injury with loss of consciousness status unknown, subsequent encounter: Secondary | ICD-10-CM | POA: Diagnosis not present

## 2023-01-26 DIAGNOSIS — I480 Paroxysmal atrial fibrillation: Secondary | ICD-10-CM | POA: Diagnosis not present

## 2023-02-02 DIAGNOSIS — I129 Hypertensive chronic kidney disease with stage 1 through stage 4 chronic kidney disease, or unspecified chronic kidney disease: Secondary | ICD-10-CM | POA: Diagnosis not present

## 2023-02-02 DIAGNOSIS — G8194 Hemiplegia, unspecified affecting left nondominant side: Secondary | ICD-10-CM | POA: Diagnosis not present

## 2023-02-02 DIAGNOSIS — J9612 Chronic respiratory failure with hypercapnia: Secondary | ICD-10-CM | POA: Diagnosis not present

## 2023-02-02 DIAGNOSIS — S069XAD Unspecified intracranial injury with loss of consciousness status unknown, subsequent encounter: Secondary | ICD-10-CM | POA: Diagnosis not present

## 2023-02-02 DIAGNOSIS — J9611 Chronic respiratory failure with hypoxia: Secondary | ICD-10-CM | POA: Diagnosis not present

## 2023-02-02 DIAGNOSIS — I480 Paroxysmal atrial fibrillation: Secondary | ICD-10-CM | POA: Diagnosis not present

## 2023-02-05 ENCOUNTER — Ambulatory Visit: Payer: Medicare Other | Attending: Cardiology | Admitting: *Deleted

## 2023-02-05 DIAGNOSIS — Z5181 Encounter for therapeutic drug level monitoring: Secondary | ICD-10-CM | POA: Diagnosis not present

## 2023-02-05 DIAGNOSIS — I4891 Unspecified atrial fibrillation: Secondary | ICD-10-CM | POA: Diagnosis not present

## 2023-02-05 LAB — POCT INR: INR: 2.3 (ref 2.0–3.0)

## 2023-02-05 NOTE — Patient Instructions (Signed)
Continue warfarin 1 1/2 tablets daily  ?Recheck in 6 weeks in [place] office.   ?

## 2023-02-09 DIAGNOSIS — J9612 Chronic respiratory failure with hypercapnia: Secondary | ICD-10-CM | POA: Diagnosis not present

## 2023-02-09 DIAGNOSIS — G8194 Hemiplegia, unspecified affecting left nondominant side: Secondary | ICD-10-CM | POA: Diagnosis not present

## 2023-02-09 DIAGNOSIS — I480 Paroxysmal atrial fibrillation: Secondary | ICD-10-CM | POA: Diagnosis not present

## 2023-02-09 DIAGNOSIS — I129 Hypertensive chronic kidney disease with stage 1 through stage 4 chronic kidney disease, or unspecified chronic kidney disease: Secondary | ICD-10-CM | POA: Diagnosis not present

## 2023-02-09 DIAGNOSIS — J9611 Chronic respiratory failure with hypoxia: Secondary | ICD-10-CM | POA: Diagnosis not present

## 2023-02-09 DIAGNOSIS — S069XAD Unspecified intracranial injury with loss of consciousness status unknown, subsequent encounter: Secondary | ICD-10-CM | POA: Diagnosis not present

## 2023-02-12 DIAGNOSIS — I480 Paroxysmal atrial fibrillation: Secondary | ICD-10-CM | POA: Diagnosis not present

## 2023-02-12 DIAGNOSIS — S069XAD Unspecified intracranial injury with loss of consciousness status unknown, subsequent encounter: Secondary | ICD-10-CM | POA: Diagnosis not present

## 2023-02-12 DIAGNOSIS — J9611 Chronic respiratory failure with hypoxia: Secondary | ICD-10-CM | POA: Diagnosis not present

## 2023-02-12 DIAGNOSIS — G8194 Hemiplegia, unspecified affecting left nondominant side: Secondary | ICD-10-CM | POA: Diagnosis not present

## 2023-02-12 DIAGNOSIS — I129 Hypertensive chronic kidney disease with stage 1 through stage 4 chronic kidney disease, or unspecified chronic kidney disease: Secondary | ICD-10-CM | POA: Diagnosis not present

## 2023-02-12 DIAGNOSIS — J9612 Chronic respiratory failure with hypercapnia: Secondary | ICD-10-CM | POA: Diagnosis not present

## 2023-02-15 DIAGNOSIS — Z8546 Personal history of malignant neoplasm of prostate: Secondary | ICD-10-CM | POA: Diagnosis not present

## 2023-02-15 DIAGNOSIS — Z8701 Personal history of pneumonia (recurrent): Secondary | ICD-10-CM | POA: Diagnosis not present

## 2023-02-15 DIAGNOSIS — J9612 Chronic respiratory failure with hypercapnia: Secondary | ICD-10-CM | POA: Diagnosis not present

## 2023-02-15 DIAGNOSIS — S069X0S Unspecified intracranial injury without loss of consciousness, sequela: Secondary | ICD-10-CM | POA: Diagnosis not present

## 2023-02-15 DIAGNOSIS — K219 Gastro-esophageal reflux disease without esophagitis: Secondary | ICD-10-CM | POA: Diagnosis not present

## 2023-02-15 DIAGNOSIS — I480 Paroxysmal atrial fibrillation: Secondary | ICD-10-CM | POA: Diagnosis not present

## 2023-02-15 DIAGNOSIS — J9611 Chronic respiratory failure with hypoxia: Secondary | ICD-10-CM | POA: Diagnosis not present

## 2023-02-15 DIAGNOSIS — Z87891 Personal history of nicotine dependence: Secondary | ICD-10-CM | POA: Diagnosis not present

## 2023-02-15 DIAGNOSIS — E559 Vitamin D deficiency, unspecified: Secondary | ICD-10-CM | POA: Diagnosis not present

## 2023-02-15 DIAGNOSIS — Z7901 Long term (current) use of anticoagulants: Secondary | ICD-10-CM | POA: Diagnosis not present

## 2023-02-15 DIAGNOSIS — E782 Mixed hyperlipidemia: Secondary | ICD-10-CM | POA: Diagnosis not present

## 2023-02-15 DIAGNOSIS — G8194 Hemiplegia, unspecified affecting left nondominant side: Secondary | ICD-10-CM | POA: Diagnosis not present

## 2023-02-15 DIAGNOSIS — I129 Hypertensive chronic kidney disease with stage 1 through stage 4 chronic kidney disease, or unspecified chronic kidney disease: Secondary | ICD-10-CM | POA: Diagnosis not present

## 2023-02-15 DIAGNOSIS — N1832 Chronic kidney disease, stage 3b: Secondary | ICD-10-CM | POA: Diagnosis not present

## 2023-02-18 ENCOUNTER — Other Ambulatory Visit: Payer: Self-pay | Admitting: Cardiology

## 2023-02-18 DIAGNOSIS — I4891 Unspecified atrial fibrillation: Secondary | ICD-10-CM

## 2023-02-19 NOTE — Telephone Encounter (Signed)
Refill request for warfarin:  Last INR was 2.3 on 02/05/23 Next INR due 03/19/23 LOV was 02/14/22  Refill approved.

## 2023-02-20 DIAGNOSIS — J9612 Chronic respiratory failure with hypercapnia: Secondary | ICD-10-CM | POA: Diagnosis not present

## 2023-02-20 DIAGNOSIS — I480 Paroxysmal atrial fibrillation: Secondary | ICD-10-CM | POA: Diagnosis not present

## 2023-02-20 DIAGNOSIS — G8194 Hemiplegia, unspecified affecting left nondominant side: Secondary | ICD-10-CM | POA: Diagnosis not present

## 2023-02-20 DIAGNOSIS — I129 Hypertensive chronic kidney disease with stage 1 through stage 4 chronic kidney disease, or unspecified chronic kidney disease: Secondary | ICD-10-CM | POA: Diagnosis not present

## 2023-02-20 DIAGNOSIS — J9611 Chronic respiratory failure with hypoxia: Secondary | ICD-10-CM | POA: Diagnosis not present

## 2023-02-20 DIAGNOSIS — S069X0S Unspecified intracranial injury without loss of consciousness, sequela: Secondary | ICD-10-CM | POA: Diagnosis not present

## 2023-02-27 DIAGNOSIS — B351 Tinea unguium: Secondary | ICD-10-CM | POA: Diagnosis not present

## 2023-02-27 DIAGNOSIS — I739 Peripheral vascular disease, unspecified: Secondary | ICD-10-CM | POA: Diagnosis not present

## 2023-02-27 DIAGNOSIS — G8190 Hemiplegia, unspecified affecting unspecified side: Secondary | ICD-10-CM | POA: Diagnosis not present

## 2023-03-03 DIAGNOSIS — J9612 Chronic respiratory failure with hypercapnia: Secondary | ICD-10-CM | POA: Diagnosis not present

## 2023-03-03 DIAGNOSIS — I129 Hypertensive chronic kidney disease with stage 1 through stage 4 chronic kidney disease, or unspecified chronic kidney disease: Secondary | ICD-10-CM | POA: Diagnosis not present

## 2023-03-03 DIAGNOSIS — G8194 Hemiplegia, unspecified affecting left nondominant side: Secondary | ICD-10-CM | POA: Diagnosis not present

## 2023-03-03 DIAGNOSIS — I480 Paroxysmal atrial fibrillation: Secondary | ICD-10-CM | POA: Diagnosis not present

## 2023-03-03 DIAGNOSIS — J9611 Chronic respiratory failure with hypoxia: Secondary | ICD-10-CM | POA: Diagnosis not present

## 2023-03-03 DIAGNOSIS — S069X0S Unspecified intracranial injury without loss of consciousness, sequela: Secondary | ICD-10-CM | POA: Diagnosis not present

## 2023-03-05 ENCOUNTER — Telehealth: Payer: Self-pay | Admitting: Internal Medicine

## 2023-03-05 NOTE — Telephone Encounter (Signed)
Copied from CRM [id number]. Topic: Clinical - Medical Advice >> Mar 05, 2023 11:39 AM Almira Coaster wrote: Reason for CRM: Patient has developed bedsores in the buttock area, his wife has been using neosporin to treat; however, wife is asking there is anything Dr.John can prescribe. Advised that an appointment is needed; however, she was trying to avoid an in office appointment due to flu season. She would like a call back to see for Dr.John's recommendation [phone].

## 2023-03-06 MED ORDER — DUODERM CGF DRESSING EX MISC: CUTANEOUS | 2 refills | Status: AC

## 2023-03-06 NOTE — Telephone Encounter (Signed)
Ok for duoderm - done erx

## 2023-03-06 NOTE — Telephone Encounter (Signed)
Called and let Pt wife know.

## 2023-03-09 DIAGNOSIS — I480 Paroxysmal atrial fibrillation: Secondary | ICD-10-CM | POA: Diagnosis not present

## 2023-03-09 DIAGNOSIS — J9612 Chronic respiratory failure with hypercapnia: Secondary | ICD-10-CM | POA: Diagnosis not present

## 2023-03-09 DIAGNOSIS — I129 Hypertensive chronic kidney disease with stage 1 through stage 4 chronic kidney disease, or unspecified chronic kidney disease: Secondary | ICD-10-CM | POA: Diagnosis not present

## 2023-03-09 DIAGNOSIS — G8194 Hemiplegia, unspecified affecting left nondominant side: Secondary | ICD-10-CM | POA: Diagnosis not present

## 2023-03-09 DIAGNOSIS — J9611 Chronic respiratory failure with hypoxia: Secondary | ICD-10-CM | POA: Diagnosis not present

## 2023-03-09 DIAGNOSIS — S069X0S Unspecified intracranial injury without loss of consciousness, sequela: Secondary | ICD-10-CM | POA: Diagnosis not present

## 2023-03-17 DIAGNOSIS — J9611 Chronic respiratory failure with hypoxia: Secondary | ICD-10-CM | POA: Diagnosis not present

## 2023-03-17 DIAGNOSIS — Z8701 Personal history of pneumonia (recurrent): Secondary | ICD-10-CM | POA: Diagnosis not present

## 2023-03-17 DIAGNOSIS — I129 Hypertensive chronic kidney disease with stage 1 through stage 4 chronic kidney disease, or unspecified chronic kidney disease: Secondary | ICD-10-CM | POA: Diagnosis not present

## 2023-03-17 DIAGNOSIS — G8194 Hemiplegia, unspecified affecting left nondominant side: Secondary | ICD-10-CM | POA: Diagnosis not present

## 2023-03-17 DIAGNOSIS — Z87891 Personal history of nicotine dependence: Secondary | ICD-10-CM | POA: Diagnosis not present

## 2023-03-17 DIAGNOSIS — I480 Paroxysmal atrial fibrillation: Secondary | ICD-10-CM | POA: Diagnosis not present

## 2023-03-17 DIAGNOSIS — J9612 Chronic respiratory failure with hypercapnia: Secondary | ICD-10-CM | POA: Diagnosis not present

## 2023-03-17 DIAGNOSIS — Z8546 Personal history of malignant neoplasm of prostate: Secondary | ICD-10-CM | POA: Diagnosis not present

## 2023-03-17 DIAGNOSIS — N1832 Chronic kidney disease, stage 3b: Secondary | ICD-10-CM | POA: Diagnosis not present

## 2023-03-17 DIAGNOSIS — S069X0S Unspecified intracranial injury without loss of consciousness, sequela: Secondary | ICD-10-CM | POA: Diagnosis not present

## 2023-03-17 DIAGNOSIS — K219 Gastro-esophageal reflux disease without esophagitis: Secondary | ICD-10-CM | POA: Diagnosis not present

## 2023-03-17 DIAGNOSIS — E559 Vitamin D deficiency, unspecified: Secondary | ICD-10-CM | POA: Diagnosis not present

## 2023-03-17 DIAGNOSIS — Z7901 Long term (current) use of anticoagulants: Secondary | ICD-10-CM | POA: Diagnosis not present

## 2023-03-17 DIAGNOSIS — E782 Mixed hyperlipidemia: Secondary | ICD-10-CM | POA: Diagnosis not present

## 2023-03-19 ENCOUNTER — Ambulatory Visit: Payer: Medicare Other | Attending: Cardiology | Admitting: *Deleted

## 2023-03-19 DIAGNOSIS — I4891 Unspecified atrial fibrillation: Secondary | ICD-10-CM | POA: Diagnosis not present

## 2023-03-19 DIAGNOSIS — Z5181 Encounter for therapeutic drug level monitoring: Secondary | ICD-10-CM | POA: Insufficient documentation

## 2023-03-19 LAB — POCT INR: INR: 2.1 (ref 2.0–3.0)

## 2023-03-19 NOTE — Patient Instructions (Signed)
Continue warfarin 1 1/2 tablets daily  ?Recheck in 6 weeks in [place] office.   ?

## 2023-03-23 ENCOUNTER — Other Ambulatory Visit: Payer: Self-pay

## 2023-03-23 ENCOUNTER — Other Ambulatory Visit: Payer: Self-pay | Admitting: Internal Medicine

## 2023-03-27 ENCOUNTER — Ambulatory Visit (INDEPENDENT_AMBULATORY_CARE_PROVIDER_SITE_OTHER)

## 2023-03-27 VITALS — Ht 67.0 in | Wt 183.0 lb

## 2023-03-27 DIAGNOSIS — Z0000 Encounter for general adult medical examination without abnormal findings: Secondary | ICD-10-CM

## 2023-03-27 NOTE — Patient Instructions (Signed)
 [name] , Thank you for taking time to come for your Medicare Wellness Visit. I appreciate your ongoing commitment to your health goals. Please review the following plan we discussed and let me know if I can assist you in the future.   Referrals/Orders/Follow-Ups/Clinician Recommendations: It was nice talking to you today.  You are due for a Shingles vaccine, a tetanus vaccine and a pneumonia vaccine.  Keep up the good work.  This is a list of the screening recommended for you and due dates:  Health Maintenance  Topic Date Due   Pneumonia Vaccine (1 of 2 - PCV) Never done   DTaP/Tdap/Td vaccine (1 - Tdap) Never done   Zoster (Shingles) Vaccine (1 of 2) Never done   COVID-19 Vaccine (5 - 2024-25 season) 09/24/2022   Medicare Annual Wellness Visit  03/26/2024   HPV Vaccine  Aged Out   Flu Shot  Discontinued    Advanced directives: (In Chart) A copy of your advanced directives are scanned into your chart should your provider ever need it.  Next Medicare Annual Wellness Visit scheduled for next year: Yes
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
## 2023-03-30 DIAGNOSIS — I480 Paroxysmal atrial fibrillation: Secondary | ICD-10-CM | POA: Diagnosis not present

## 2023-03-30 DIAGNOSIS — J9611 Chronic respiratory failure with hypoxia: Secondary | ICD-10-CM | POA: Diagnosis not present

## 2023-03-30 DIAGNOSIS — S069X0S Unspecified intracranial injury without loss of consciousness, sequela: Secondary | ICD-10-CM | POA: Diagnosis not present

## 2023-03-30 DIAGNOSIS — G8194 Hemiplegia, unspecified affecting left nondominant side: Secondary | ICD-10-CM | POA: Diagnosis not present

## 2023-03-30 DIAGNOSIS — J9612 Chronic respiratory failure with hypercapnia: Secondary | ICD-10-CM | POA: Diagnosis not present

## 2023-03-30 DIAGNOSIS — I129 Hypertensive chronic kidney disease with stage 1 through stage 4 chronic kidney disease, or unspecified chronic kidney disease: Secondary | ICD-10-CM | POA: Diagnosis not present

## 2023-04-05 ENCOUNTER — Ambulatory Visit (INDEPENDENT_AMBULATORY_CARE_PROVIDER_SITE_OTHER): Payer: Medicare Other | Admitting: Internal Medicine

## 2023-04-05 VITALS — BP 138/78 | HR 72 | Temp 98.1°F | Ht 67.0 in | Wt 177.0 lb

## 2023-04-05 DIAGNOSIS — J309 Allergic rhinitis, unspecified: Secondary | ICD-10-CM | POA: Diagnosis not present

## 2023-04-05 DIAGNOSIS — I10 Essential (primary) hypertension: Secondary | ICD-10-CM | POA: Diagnosis not present

## 2023-04-05 DIAGNOSIS — E559 Vitamin D deficiency, unspecified: Secondary | ICD-10-CM

## 2023-04-05 DIAGNOSIS — N1832 Chronic kidney disease, stage 3b: Secondary | ICD-10-CM

## 2023-04-05 DIAGNOSIS — R197 Diarrhea, unspecified: Secondary | ICD-10-CM

## 2023-04-05 DIAGNOSIS — R739 Hyperglycemia, unspecified: Secondary | ICD-10-CM | POA: Diagnosis not present

## 2023-04-05 DIAGNOSIS — E538 Deficiency of other specified B group vitamins: Secondary | ICD-10-CM

## 2023-04-05 DIAGNOSIS — E782 Mixed hyperlipidemia: Secondary | ICD-10-CM

## 2023-04-05 DIAGNOSIS — R634 Abnormal weight loss: Secondary | ICD-10-CM | POA: Insufficient documentation

## 2023-04-05 LAB — BASIC METABOLIC PANEL
BUN: 21 mg/dL (ref 6–23)
CO2: 28 meq/L (ref 19–32)
Calcium: 9.7 mg/dL (ref 8.4–10.5)
Chloride: 106 meq/L (ref 96–112)
Creatinine, Ser: 1.74 mg/dL — ABNORMAL HIGH (ref 0.40–1.50)
GFR: 34.65 mL/min — ABNORMAL LOW (ref >60.00)
Glucose, Bld: 83 mg/dL (ref 70–99)
Potassium: 4.1 meq/L (ref 3.5–5.1)
Sodium: 140 meq/L (ref 135–145)

## 2023-04-05 LAB — CBC WITH DIFFERENTIAL/PLATELET
Basophils Absolute: 0.1 10*3/uL (ref 0.0–0.1)
Basophils Relative: 0.6 % (ref 0.0–3.0)
Eosinophils Absolute: 0.1 10*3/uL (ref 0.0–0.7)
Eosinophils Relative: 0.9 % (ref 0.0–5.0)
HCT: 41.7 % (ref 39.0–52.0)
Hemoglobin: 14 g/dL (ref 13.0–17.0)
Lymphocytes Relative: 13.1 % (ref 12.0–46.0)
Lymphs Abs: 1.2 10*3/uL (ref 0.7–4.0)
MCHC: 33.6 g/dL (ref 30.0–36.0)
MCV: 94.7 fl (ref 78.0–100.0)
Monocytes Absolute: 0.9 10*3/uL (ref 0.1–1.0)
Monocytes Relative: 9.7 % (ref 3.0–12.0)
Neutro Abs: 7 10*3/uL (ref 1.4–7.7)
Neutrophils Relative %: 75.7 % (ref 43.0–77.0)
Platelets: 197 10*3/uL (ref 150.0–400.0)
RBC: 4.4 Mil/uL (ref 4.22–5.81)
RDW: 13.9 % (ref 11.5–15.5)
WBC: 9.2 10*3/uL (ref 4.0–10.5)

## 2023-04-05 LAB — HEPATIC FUNCTION PANEL
ALT: 11 U/L (ref 0–53)
AST: 20 U/L (ref 0–37)
Albumin: 4.3 g/dL (ref 3.5–5.2)
Alkaline Phosphatase: 53 U/L (ref 39–117)
Bilirubin, Direct: 0.1 mg/dL (ref 0.0–0.3)
Total Bilirubin: 0.5 mg/dL (ref 0.2–1.2)
Total Protein: 7.3 g/dL (ref 6.0–8.3)

## 2023-04-05 LAB — LIPASE: Lipase: 14 U/L (ref 11.0–59.0)

## 2023-04-05 MED ORDER — DIPHENOXYLATE-ATROPINE 2.5-0.025 MG PO TABS: 1.0000 | ORAL_TABLET | Freq: Four times a day (QID) | ORAL | 1 refills | Status: AC | PRN
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
## 2023-04-05 NOTE — Patient Instructions (Signed)
 Please take all new medication as prescribed  - the lomotil for diarrhea as needed  Please continue all other medications as before, and refills have been done if requested.  Please have the pharmacy call with any other refills you may need.  Please keep your appointments with your specialists as you may have planned  Please go to the LAB at the blood drawing area for the tests to be done  You will be contacted by phone if any changes need to be made immediately.  Otherwise, you will receive a letter about your results with an explanation, but please check with MyChart first.  Please make an Appointment to return in 6 months, or sooner if needed

## 2023-04-06 ENCOUNTER — Other Ambulatory Visit: Payer: Self-pay

## 2023-04-06 ENCOUNTER — Other Ambulatory Visit: Payer: Self-pay | Admitting: Internal Medicine

## 2023-04-07 ENCOUNTER — Encounter: Payer: Self-pay | Admitting: Internal Medicine

## 2023-04-07 NOTE — Assessment & Plan Note (Signed)
Lab Results  Component Value Date   HGBA1C 5.9 10/04/2022   Stable, pt to continue current medical treatment  - diet, wt control

## 2023-04-07 NOTE — Assessment & Plan Note (Signed)
 BP Readings from Last 3 Encounters:  04/05/23 138/78  10/04/22 136/82  09/06/22 (!) 160/76   Stable, pt to continue medical treatment norvasc 5 qd

## 2023-04-07 NOTE — Assessment & Plan Note (Signed)
Lab Results  Component Value Date   VITAMINB12 >1501 (H) 10/04/2022   Stable, cont oral replacement - b12 1000 mcg qd

## 2023-04-07 NOTE — Assessment & Plan Note (Signed)
 Mild to mod, for otc allegra and/or nasacort asd,  to f/u any worsening symptoms or concerns

## 2023-04-07 NOTE — Assessment & Plan Note (Signed)
 Immodium not working, ok for limited lomotil prn, and probiotic otc

## 2023-04-07 NOTE — Assessment & Plan Note (Signed)
 Lab Results  Component Value Date   CREATININE 1.74 (H) 04/05/2023   Stable overall, cont to avoid nephrotoxins

## 2023-04-07 NOTE — Assessment & Plan Note (Signed)
Last vitamin D Lab Results  Component Value Date   VD25OH 73.63 10/04/2022   Stable, cont oral replacement

## 2023-04-07 NOTE — Assessment & Plan Note (Signed)
Lab Results  Component Value Date   LDLCALC 66 10/04/2022   Stable, pt to continue current statin crestor 20 qd

## 2023-04-09 ENCOUNTER — Other Ambulatory Visit (INDEPENDENT_AMBULATORY_CARE_PROVIDER_SITE_OTHER)

## 2023-04-09 ENCOUNTER — Other Ambulatory Visit: Payer: Self-pay | Admitting: Internal Medicine

## 2023-04-09 DIAGNOSIS — R197 Diarrhea, unspecified: Secondary | ICD-10-CM

## 2023-04-09 DIAGNOSIS — I10 Essential (primary) hypertension: Secondary | ICD-10-CM

## 2023-04-09 DIAGNOSIS — R300 Dysuria: Secondary | ICD-10-CM | POA: Diagnosis not present

## 2023-04-09 LAB — URINALYSIS, ROUTINE W REFLEX MICROSCOPIC
Bilirubin Urine: NEGATIVE
Hgb urine dipstick: NEGATIVE
Ketones, ur: NEGATIVE
Leukocytes,Ua: NEGATIVE
Nitrite: NEGATIVE
Specific Gravity, Urine: 1.02 (ref 1.000–1.030)
Total Protein, Urine: 30 — AB
Urine Glucose: 100 — AB
Urobilinogen, UA: 1 (ref 0.0–1.0)
pH: 6 (ref 5.0–8.0)

## 2023-04-10 LAB — URINE CULTURE

## 2023-04-11 ENCOUNTER — Encounter: Payer: Self-pay | Admitting: Internal Medicine

## 2023-04-11 DIAGNOSIS — J9611 Chronic respiratory failure with hypoxia: Secondary | ICD-10-CM | POA: Diagnosis not present

## 2023-04-11 DIAGNOSIS — S069X0S Unspecified intracranial injury without loss of consciousness, sequela: Secondary | ICD-10-CM | POA: Diagnosis not present

## 2023-04-11 DIAGNOSIS — G8194 Hemiplegia, unspecified affecting left nondominant side: Secondary | ICD-10-CM | POA: Diagnosis not present

## 2023-04-11 DIAGNOSIS — I480 Paroxysmal atrial fibrillation: Secondary | ICD-10-CM | POA: Diagnosis not present

## 2023-04-11 DIAGNOSIS — I129 Hypertensive chronic kidney disease with stage 1 through stage 4 chronic kidney disease, or unspecified chronic kidney disease: Secondary | ICD-10-CM | POA: Diagnosis not present

## 2023-04-11 DIAGNOSIS — J9612 Chronic respiratory failure with hypercapnia: Secondary | ICD-10-CM | POA: Diagnosis not present

## 2023-04-11 LAB — GASTROINTESTINAL PATHOGEN PNL
CampyloBacter Group: NOT DETECTED
Norovirus GI/GII: NOT DETECTED
Rotavirus A: NOT DETECTED
Salmonella species: NOT DETECTED
Shiga Toxin 1: NOT DETECTED
Shiga Toxin 2: NOT DETECTED
Shigella Species: NOT DETECTED
Vibrio Group: NOT DETECTED
Yersinia enterocolitica: NOT DETECTED

## 2023-04-16 DIAGNOSIS — Z7901 Long term (current) use of anticoagulants: Secondary | ICD-10-CM | POA: Diagnosis not present

## 2023-04-16 DIAGNOSIS — E782 Mixed hyperlipidemia: Secondary | ICD-10-CM | POA: Diagnosis not present

## 2023-04-16 DIAGNOSIS — Z8701 Personal history of pneumonia (recurrent): Secondary | ICD-10-CM | POA: Diagnosis not present

## 2023-04-16 DIAGNOSIS — G8194 Hemiplegia, unspecified affecting left nondominant side: Secondary | ICD-10-CM | POA: Diagnosis not present

## 2023-04-16 DIAGNOSIS — I129 Hypertensive chronic kidney disease with stage 1 through stage 4 chronic kidney disease, or unspecified chronic kidney disease: Secondary | ICD-10-CM | POA: Diagnosis not present

## 2023-04-16 DIAGNOSIS — E559 Vitamin D deficiency, unspecified: Secondary | ICD-10-CM | POA: Diagnosis not present

## 2023-04-16 DIAGNOSIS — I480 Paroxysmal atrial fibrillation: Secondary | ICD-10-CM | POA: Diagnosis not present

## 2023-04-16 DIAGNOSIS — Z8546 Personal history of malignant neoplasm of prostate: Secondary | ICD-10-CM | POA: Diagnosis not present

## 2023-04-16 DIAGNOSIS — S069X0S Unspecified intracranial injury without loss of consciousness, sequela: Secondary | ICD-10-CM | POA: Diagnosis not present

## 2023-04-16 DIAGNOSIS — J9611 Chronic respiratory failure with hypoxia: Secondary | ICD-10-CM | POA: Diagnosis not present

## 2023-04-16 DIAGNOSIS — J9612 Chronic respiratory failure with hypercapnia: Secondary | ICD-10-CM | POA: Diagnosis not present

## 2023-04-16 DIAGNOSIS — N1832 Chronic kidney disease, stage 3b: Secondary | ICD-10-CM | POA: Diagnosis not present

## 2023-04-16 DIAGNOSIS — K219 Gastro-esophageal reflux disease without esophagitis: Secondary | ICD-10-CM | POA: Diagnosis not present

## 2023-04-16 DIAGNOSIS — Z87891 Personal history of nicotine dependence: Secondary | ICD-10-CM | POA: Diagnosis not present

## 2023-04-18 DIAGNOSIS — N1832 Chronic kidney disease, stage 3b: Secondary | ICD-10-CM | POA: Diagnosis not present

## 2023-04-18 DIAGNOSIS — I129 Hypertensive chronic kidney disease with stage 1 through stage 4 chronic kidney disease, or unspecified chronic kidney disease: Secondary | ICD-10-CM | POA: Diagnosis not present

## 2023-04-18 DIAGNOSIS — G839 Paralytic syndrome, unspecified: Secondary | ICD-10-CM | POA: Diagnosis not present

## 2023-04-18 DIAGNOSIS — N2581 Secondary hyperparathyroidism of renal origin: Secondary | ICD-10-CM | POA: Diagnosis not present

## 2023-04-18 DIAGNOSIS — N189 Chronic kidney disease, unspecified: Secondary | ICD-10-CM | POA: Diagnosis not present

## 2023-04-18 DIAGNOSIS — D631 Anemia in chronic kidney disease: Secondary | ICD-10-CM | POA: Diagnosis not present

## 2023-04-18 DIAGNOSIS — E872 Acidosis, unspecified: Secondary | ICD-10-CM | POA: Diagnosis not present

## 2023-04-19 ENCOUNTER — Ambulatory Visit: Payer: Self-pay

## 2023-04-19 DIAGNOSIS — I480 Paroxysmal atrial fibrillation: Secondary | ICD-10-CM | POA: Diagnosis not present

## 2023-04-19 DIAGNOSIS — J9611 Chronic respiratory failure with hypoxia: Secondary | ICD-10-CM | POA: Diagnosis not present

## 2023-04-19 DIAGNOSIS — S069X0S Unspecified intracranial injury without loss of consciousness, sequela: Secondary | ICD-10-CM | POA: Diagnosis not present

## 2023-04-19 DIAGNOSIS — J9612 Chronic respiratory failure with hypercapnia: Secondary | ICD-10-CM | POA: Diagnosis not present

## 2023-04-19 DIAGNOSIS — G8194 Hemiplegia, unspecified affecting left nondominant side: Secondary | ICD-10-CM | POA: Diagnosis not present

## 2023-04-19 DIAGNOSIS — I129 Hypertensive chronic kidney disease with stage 1 through stage 4 chronic kidney disease, or unspecified chronic kidney disease: Secondary | ICD-10-CM | POA: Diagnosis not present

## 2023-04-19 NOTE — Telephone Encounter (Addendum)
 Chief Complaint: diarrhea Symptoms: diarrhea Frequency: over 1 month Pertinent Negatives: Patient denies abdominal pain, nausea, vomiting, fever, CP, SOB, weakness/dizziness, dry mouth, decreased urination Disposition: [] ED /[] Urgent Care (no appt availability in office) / [x] Appointment(In office/virtual)/ []  [facility]/ [] Home Care/ [] Refused Recommended Disposition /[] [facility]/ []  Follow-up with PCP Additional Notes: Wife Britta Mccreedy called on behalf of the pt. Wife states pt has had diarrhea for over a month. Wife states the diarrhea is intermittent. Sometimes the pt will not have diarrhea for 3-4 days and then diarrhea will return. Pt was seen in the office on 3/13 with Dr Jonny Ruiz and prescribed Lomotil. Wife states it helps some but pt still has diarrhea. Pt denies nausea and vomiting. Pt endorses some abdominal discomfort and "bubbling" ("my stomach is acting up") right before having an episode of diarrhea. Otherwise, pt does not have abdominal pain. Denies bloody stools. Pt has a good appetite and is drinking well. Denies fever. RN advised wife pt should be seen within 24 hours. Wife agreeable. Pt has physical therapy today and can't be seen today. Wife asked for an afternoon appt tomorrow as she states it takes some time to get him dressed. RN scheduled pt for tomorrow 3/28 at 1520. RN advised wife if the pt develops N/V, abdominal pain, a fever, or weakness, they should go to the ED. Wife verbalized understanding.         Summary: Diarhea   Copied From CRM [id number]. Reason for Triage: Patient spouse Britta Mccreedy called, patient seen 3/13 and prescribed diphenoxylate-atropine (LOMOTIL) for diarrhea, she says the medication works for a bit but he's still having diarrhea on and off and wants to know what to do moving forward [phone]           Reason for Disposition  [1] MODERATE diarrhea (e.g., 4-6 times / day more than normal) AND [2] present > 48 hours (2  days)  Answer Assessment - Initial Assessment Questions 1. DIARRHEA SEVERITY: "How bad is the diarrhea?" "How many more stools have you had in the past 24 hours than normal?"    - NO DIARRHEA (SCALE 0)   - MILD (SCALE 1-3): Few loose or mushy BMs; increase of 1-3 stools over normal daily number of stools; mild increase in ostomy output.   -  MODERATE (SCALE 4-7): Increase of 4-6 stools daily over normal; moderate increase in ostomy output.   -  SEVERE (SCALE 8-10; OR "WORST POSSIBLE"): Increase of 7 or more stools daily over normal; moderate increase in ostomy output; incontinence.     "It will vary", "we have been dealing with this for over a month off and on", "watery stool", "it might go on all day today and we will think it's over but 3-4 days later it happens again", "I gave him the Lomotil when this happens but it doesn't seem to be doing any good", "he says he doesn't feel bad", "we have been watching what he eats", "he woke me up at 0600 and I heard him stomach bubble", another episode before 0800, now he is back in the bathroom. Likely 3 episodes today. About 5 episodes of diarrhea yesterday. 2. ONSET: "When did the diarrhea begin?"      For 1 month or more, but not consistently, comes and goes every 3-4 days 3. BM CONSISTENCY: "How loose or watery is the diarrhea?"      "It's not firm at all", "it's pretty watery" 4. VOMITING: "Are you also vomiting?" If Yes, ask: "How many  times in the past 24 hours?"      No 5. ABDOMEN PAIN: "Are you having any abdomen pain?" If Yes, ask: "What does it feel like?" (e.g., crampy, dull, intermittent, constant)      No 6. ABDOMEN PAIN SEVERITY: If present, ask: "How bad is the pain?"  (e.g., Scale 1-10; mild, moderate, or severe)   - MILD (1-3): doesn't interfere with normal activities, abdomen soft and not tender to touch    - MODERATE (4-7): interferes with normal activities or awakens from sleep, abdomen tender to touch    - SEVERE (8-10):  excruciating pain, doubled over, unable to do any normal activities       None - other than "bubbling" before diarrhea audible to the wife, "my stomach acting up" 7. ORAL INTAKE: If vomiting, "Have you been able to drink liquids?" "How much liquids have you had in the past 24 hours?"     "Appetite is good", "he eats whatever I give him or ask for something else", "I cut back on his coffee and fruit" 8. HYDRATION: "Any signs of dehydration?" (e.g., dry mouth [not just dry lips], too weak to stand, dizziness, new weight loss) "When did you last urinate?"     "Drinking plenty of fluids" 9. EXPOSURE: "Have you traveled to a foreign country recently?" "Have you been exposed to anyone with diarrhea?" "Could you have eaten any food that was spoiled?"     N/A 10. ANTIBIOTIC USE: "Are you taking antibiotics now or have you taken antibiotics in the past 2 months?"       N/A 11. OTHER SYMPTOMS: "Do you have any other symptoms?" (e.g., fever, blood in stool)       No fever, no vomiting, no nausea. Taking Lomotil prescribed 3/13, pt still has diarrhea  Protocols used: [facility]

## 2023-04-19 NOTE — Patient Instructions (Incomplete)
      Blood work was ordered.       Medications changes include :   None    A referral was ordered and someone will call you to schedule an appointment.     No follow-ups on file.

## 2023-04-19 NOTE — Progress Notes (Unsigned)
    Subjective:    Patient ID: Austin [name], male    DOB: 01-13-1933, 89 y.o.   MRN: [id number]      HPI Austin [name] is here for No chief complaint on file.    Diarrhea - saw Dr Austin [name] 3/13 for diarrhea x 3 weeks.  Has some intestinal gurgling, but no pain, fever, dec appetite, dysphagia, N/V, brbpr and melena.  Labs on 3/13 - CKD stable.  Lft, cbc normal.  Stool cx done but it was not complete - norovirus, rotavirus, yersina.       Medications and allergies reviewed with patient and updated if appropriate.  Current Outpatient Medications on File Prior to Visit  Medication Sig Dispense Refill   acetaminophen (TYLENOL) 325 MG tablet Take 650 mg by mouth every 8 (eight) hours as needed for moderate pain.  (Patient not taking: Reported on 03/29/2022)     amLODipine (NORVASC) 5 MG tablet TAKE 1 TABLET BY MOUTH ONCE A DAY 90 tablet 3   calcitRIOL (ROCALTROL) 0.25 MCG capsule Take 1 capsule (0.25 mcg total) by mouth daily. 90 capsule 3   cholecalciferol (VITAMIN D) 1000 units tablet Take 2,000 Units by mouth daily.     Control Gel Formula Dressing (DUODERM CGF DRESSING) MISC Use as directed to affected area as needed 20 each 2   diphenoxylate-atropine (LOMOTIL) 2.5-0.025 MG tablet Take 1 tablet by mouth 4 (four) times daily as needed for diarrhea or loose stools. 60 tablet 1   K Phos Mono-Sod Phos Di & Mono (PHOSPHA 250 NEUTRAL) 155-852-130 MG TABS Take 2 tablets by mouth 2 (two) times daily.     predniSONE (DELTASONE) 10 MG tablet 2 tabs by mouth per day for 5 days (Patient not taking: Reported on 04/05/2023) 10 tablet 0   rosuvastatin (CRESTOR) 20 MG tablet TAKE ONE TABLET BY MOUTH ONCE DAILY. 90 tablet 2   sodium bicarbonate 650 MG tablet Take 1 tablet (650 mg total) by mouth 2 (two) times daily. (Patient taking differently: Take 1,300 mg by mouth 2 (two) times daily.) 60 tablet 11   vitamin B-12 (CYANOCOBALAMIN) 1000 MCG tablet Take 1 tablet (1,000 mcg total) by mouth daily. 90 tablet 3    warfarin (COUMADIN) 5 MG tablet TAKE 1 & 1/2 TO 2 TABLETS BY MOUTH DAILY OR AS DIRECTED 60 tablet 5   No current facility-administered medications on file prior to visit.    Review of Systems     Objective:  There were no vitals filed for this visit. BP Readings from Last 3 Encounters:  04/05/23 138/78  10/04/22 136/82  09/06/22 (!) 160/76   Wt Readings from Last 3 Encounters:  04/05/23 177 lb (80.3 kg)  03/27/23 183 lb (83 kg)  09/06/22 214 lb 15.2 oz (97.5 kg)   There is no height or weight on file to calculate BMI.    Physical Exam         Assessment & Plan:    See Problem List for Assessment and Plan of chronic medical problems.

## 2023-04-20 ENCOUNTER — Ambulatory Visit: Admitting: Internal Medicine

## 2023-04-20 ENCOUNTER — Encounter: Payer: Self-pay | Admitting: Internal Medicine

## 2023-04-20 VITALS — BP 146/56 | HR 61 | Temp 98.2°F

## 2023-04-20 DIAGNOSIS — R197 Diarrhea, unspecified: Secondary | ICD-10-CM

## 2023-04-26 DIAGNOSIS — J9612 Chronic respiratory failure with hypercapnia: Secondary | ICD-10-CM | POA: Diagnosis not present

## 2023-04-26 DIAGNOSIS — I480 Paroxysmal atrial fibrillation: Secondary | ICD-10-CM | POA: Diagnosis not present

## 2023-04-26 DIAGNOSIS — S069X0S Unspecified intracranial injury without loss of consciousness, sequela: Secondary | ICD-10-CM | POA: Diagnosis not present

## 2023-04-26 DIAGNOSIS — I129 Hypertensive chronic kidney disease with stage 1 through stage 4 chronic kidney disease, or unspecified chronic kidney disease: Secondary | ICD-10-CM | POA: Diagnosis not present

## 2023-04-26 DIAGNOSIS — J9611 Chronic respiratory failure with hypoxia: Secondary | ICD-10-CM | POA: Diagnosis not present

## 2023-04-26 DIAGNOSIS — G8194 Hemiplegia, unspecified affecting left nondominant side: Secondary | ICD-10-CM | POA: Diagnosis not present

## 2023-04-30 ENCOUNTER — Ambulatory Visit: Payer: Medicare Other | Attending: Cardiology | Admitting: *Deleted

## 2023-04-30 DIAGNOSIS — I4891 Unspecified atrial fibrillation: Secondary | ICD-10-CM | POA: Insufficient documentation

## 2023-04-30 DIAGNOSIS — Z5181 Encounter for therapeutic drug level monitoring: Secondary | ICD-10-CM | POA: Insufficient documentation

## 2023-04-30 LAB — POCT INR: INR: 2.7 (ref 2.0–3.0)

## 2023-04-30 NOTE — Patient Instructions (Signed)
Continue warfarin 1 1/2 tablets daily  ?Recheck in 6 weeks in [place] office.   ?

## 2023-05-03 ENCOUNTER — Encounter: Payer: Self-pay | Admitting: Physician Assistant

## 2023-05-03 DIAGNOSIS — S069X0S Unspecified intracranial injury without loss of consciousness, sequela: Secondary | ICD-10-CM | POA: Diagnosis not present

## 2023-05-03 DIAGNOSIS — J9611 Chronic respiratory failure with hypoxia: Secondary | ICD-10-CM | POA: Diagnosis not present

## 2023-05-03 DIAGNOSIS — G8194 Hemiplegia, unspecified affecting left nondominant side: Secondary | ICD-10-CM | POA: Diagnosis not present

## 2023-05-03 DIAGNOSIS — J9612 Chronic respiratory failure with hypercapnia: Secondary | ICD-10-CM | POA: Diagnosis not present

## 2023-05-03 DIAGNOSIS — I480 Paroxysmal atrial fibrillation: Secondary | ICD-10-CM | POA: Diagnosis not present

## 2023-05-03 DIAGNOSIS — I129 Hypertensive chronic kidney disease with stage 1 through stage 4 chronic kidney disease, or unspecified chronic kidney disease: Secondary | ICD-10-CM | POA: Diagnosis not present

## 2023-05-08 DIAGNOSIS — G8194 Hemiplegia, unspecified affecting left nondominant side: Secondary | ICD-10-CM | POA: Diagnosis not present

## 2023-05-08 DIAGNOSIS — I739 Peripheral vascular disease, unspecified: Secondary | ICD-10-CM | POA: Diagnosis not present

## 2023-05-08 DIAGNOSIS — B351 Tinea unguium: Secondary | ICD-10-CM | POA: Diagnosis not present

## 2023-05-08 DIAGNOSIS — I129 Hypertensive chronic kidney disease with stage 1 through stage 4 chronic kidney disease, or unspecified chronic kidney disease: Secondary | ICD-10-CM | POA: Diagnosis not present

## 2023-05-08 DIAGNOSIS — S069X0S Unspecified intracranial injury without loss of consciousness, sequela: Secondary | ICD-10-CM | POA: Diagnosis not present

## 2023-05-08 DIAGNOSIS — G8190 Hemiplegia, unspecified affecting unspecified side: Secondary | ICD-10-CM | POA: Diagnosis not present

## 2023-05-08 DIAGNOSIS — J9611 Chronic respiratory failure with hypoxia: Secondary | ICD-10-CM | POA: Diagnosis not present

## 2023-05-08 DIAGNOSIS — J9612 Chronic respiratory failure with hypercapnia: Secondary | ICD-10-CM | POA: Diagnosis not present

## 2023-05-08 DIAGNOSIS — L84 Corns and callosities: Secondary | ICD-10-CM | POA: Diagnosis not present

## 2023-05-08 DIAGNOSIS — I480 Paroxysmal atrial fibrillation: Secondary | ICD-10-CM | POA: Diagnosis not present

## 2023-05-16 DIAGNOSIS — Z8546 Personal history of malignant neoplasm of prostate: Secondary | ICD-10-CM | POA: Diagnosis not present

## 2023-05-16 DIAGNOSIS — S069X0S Unspecified intracranial injury without loss of consciousness, sequela: Secondary | ICD-10-CM | POA: Diagnosis not present

## 2023-05-16 DIAGNOSIS — Z8701 Personal history of pneumonia (recurrent): Secondary | ICD-10-CM | POA: Diagnosis not present

## 2023-05-16 DIAGNOSIS — G8194 Hemiplegia, unspecified affecting left nondominant side: Secondary | ICD-10-CM | POA: Diagnosis not present

## 2023-05-16 DIAGNOSIS — J9612 Chronic respiratory failure with hypercapnia: Secondary | ICD-10-CM | POA: Diagnosis not present

## 2023-05-16 DIAGNOSIS — K219 Gastro-esophageal reflux disease without esophagitis: Secondary | ICD-10-CM | POA: Diagnosis not present

## 2023-05-16 DIAGNOSIS — J9611 Chronic respiratory failure with hypoxia: Secondary | ICD-10-CM | POA: Diagnosis not present

## 2023-05-16 DIAGNOSIS — E559 Vitamin D deficiency, unspecified: Secondary | ICD-10-CM | POA: Diagnosis not present

## 2023-05-16 DIAGNOSIS — E782 Mixed hyperlipidemia: Secondary | ICD-10-CM | POA: Diagnosis not present

## 2023-05-16 DIAGNOSIS — N1832 Chronic kidney disease, stage 3b: Secondary | ICD-10-CM | POA: Diagnosis not present

## 2023-05-16 DIAGNOSIS — I480 Paroxysmal atrial fibrillation: Secondary | ICD-10-CM | POA: Diagnosis not present

## 2023-05-16 DIAGNOSIS — Z7901 Long term (current) use of anticoagulants: Secondary | ICD-10-CM | POA: Diagnosis not present

## 2023-05-16 DIAGNOSIS — I129 Hypertensive chronic kidney disease with stage 1 through stage 4 chronic kidney disease, or unspecified chronic kidney disease: Secondary | ICD-10-CM | POA: Diagnosis not present

## 2023-05-16 DIAGNOSIS — Z87891 Personal history of nicotine dependence: Secondary | ICD-10-CM | POA: Diagnosis not present

## 2023-05-25 DIAGNOSIS — J9611 Chronic respiratory failure with hypoxia: Secondary | ICD-10-CM | POA: Diagnosis not present

## 2023-05-25 DIAGNOSIS — I480 Paroxysmal atrial fibrillation: Secondary | ICD-10-CM | POA: Diagnosis not present

## 2023-05-25 DIAGNOSIS — I129 Hypertensive chronic kidney disease with stage 1 through stage 4 chronic kidney disease, or unspecified chronic kidney disease: Secondary | ICD-10-CM | POA: Diagnosis not present

## 2023-05-25 DIAGNOSIS — J9612 Chronic respiratory failure with hypercapnia: Secondary | ICD-10-CM | POA: Diagnosis not present

## 2023-05-25 DIAGNOSIS — S069X0S Unspecified intracranial injury without loss of consciousness, sequela: Secondary | ICD-10-CM | POA: Diagnosis not present

## 2023-05-25 DIAGNOSIS — G8194 Hemiplegia, unspecified affecting left nondominant side: Secondary | ICD-10-CM | POA: Diagnosis not present

## 2023-05-30 ENCOUNTER — Encounter: Payer: Self-pay | Admitting: Physician Assistant

## 2023-05-30 ENCOUNTER — Ambulatory Visit (INDEPENDENT_AMBULATORY_CARE_PROVIDER_SITE_OTHER): Admitting: Physician Assistant

## 2023-05-30 ENCOUNTER — Ambulatory Visit
Admission: RE | Admit: 2023-05-30 | Discharge: 2023-05-30 | Disposition: A | Discharge status: Home / Self Care | Source: Ambulatory Visit | Attending: Physician Assistant | Admitting: Physician Assistant

## 2023-05-30 VITALS — BP 138/80 | HR 70

## 2023-05-30 DIAGNOSIS — R197 Diarrhea, unspecified: Secondary | ICD-10-CM

## 2023-05-30 DIAGNOSIS — K5900 Constipation, unspecified: Secondary | ICD-10-CM

## 2023-05-30 DIAGNOSIS — K529 Noninfective gastroenteritis and colitis, unspecified: Secondary | ICD-10-CM | POA: Diagnosis not present

## 2023-05-30 DIAGNOSIS — R110 Nausea: Secondary | ICD-10-CM | POA: Diagnosis not present
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
## 2023-05-30 NOTE — Patient Instructions (Signed)
 Your provider has requested that you go to the basement level for lab work before leaving today. Press "B" on the elevator. The lab is located at the first door on the left as you exit the elevator.  Your provider has ordered "Diatherix" stool testing for you. You have received a kit from our office today containing all necessary supplies to complete this test. Please carefully read the stool collection instructions provided in the kit before opening the accompanying materials. In addition, be sure there is a label providing your full name and date of birth on the "puritan opti-swab" tube that is supplied in the kit (if you do not see a label with this information on your test tube, please make us  aware before test collection!). After completing the test, you should secure the purtian tube into the specimen biohazard bag. The [facility] E-Req sheet (including date and time of specimen collection) should be placed into the outside pocket of the specimen biohazard bag and returned to the [facility] lab (basement floor of [facility]) within 3 days of collection. Please make sure to give the specimen to a staff member at the lab. DO NOT leave the specimen on the counter.   If the specimen date and time (can be found in the upper right boxed portion of the sheet) are not filled out on the E-Req sheet, the test will NOT be performed.   Please follow up sooner if symptoms increase or worsen  Due to recent changes in healthcare laws, you may see the results of your imaging and laboratory studies on MyChart before your provider has had a chance to review them.  We understand that in some cases there may be results that are confusing or concerning to you. Not all laboratory results come back in the same time frame and the provider may be waiting for multiple results in order to interpret others.  Please give us  48 hours in order for your provider to thoroughly review all the results  before contacting the office for clarification of your results.   _______________________________________________________  If your blood pressure at your visit was 140/90 or greater, please contact your primary care physician to follow up on this.  _______________________________________________________  If you are age 18 or older, your body mass index should be between 23-30. Your There is no height or weight on file to calculate BMI. If this is out of the aforementioned range listed, please consider follow up with your Primary Care Provider.  If you are age 22 or younger, your body mass index should be between 19-25. Your There is no height or weight on file to calculate BMI. If this is out of the aformentioned range listed, please consider follow up with your Primary Care Provider.   ________________________________________________________  The [facility] providers would like to encourage you to use MYCHART to communicate with providers for non-urgent requests or questions.  Due to long hold times on the telephone, sending your provider a message by [facility] may be a faster and more efficient way to get a response.  Please allow 48 business hours for a response.  Please remember that this is for non-urgent requests.  _______________________________________________________ Thank you for trusting me with your gastrointestinal care!   Brigitte Canard, PA

## 2023-05-31 DIAGNOSIS — I480 Paroxysmal atrial fibrillation: Secondary | ICD-10-CM | POA: Diagnosis not present

## 2023-05-31 DIAGNOSIS — G8194 Hemiplegia, unspecified affecting left nondominant side: Secondary | ICD-10-CM | POA: Diagnosis not present

## 2023-05-31 DIAGNOSIS — I129 Hypertensive chronic kidney disease with stage 1 through stage 4 chronic kidney disease, or unspecified chronic kidney disease: Secondary | ICD-10-CM | POA: Diagnosis not present

## 2023-05-31 DIAGNOSIS — J9611 Chronic respiratory failure with hypoxia: Secondary | ICD-10-CM | POA: Diagnosis not present

## 2023-05-31 DIAGNOSIS — S069X0S Unspecified intracranial injury without loss of consciousness, sequela: Secondary | ICD-10-CM | POA: Diagnosis not present

## 2023-05-31 DIAGNOSIS — J9612 Chronic respiratory failure with hypercapnia: Secondary | ICD-10-CM | POA: Diagnosis not present

## 2023-06-04 NOTE — Progress Notes (Signed)
 Addendum: Reviewed and agree with assessment and management plan. Asha Grumbine, Carie Caddy, MD

## 2023-06-07 DIAGNOSIS — J9612 Chronic respiratory failure with hypercapnia: Secondary | ICD-10-CM | POA: Diagnosis not present

## 2023-06-07 DIAGNOSIS — I129 Hypertensive chronic kidney disease with stage 1 through stage 4 chronic kidney disease, or unspecified chronic kidney disease: Secondary | ICD-10-CM | POA: Diagnosis not present

## 2023-06-07 DIAGNOSIS — J9611 Chronic respiratory failure with hypoxia: Secondary | ICD-10-CM | POA: Diagnosis not present

## 2023-06-07 DIAGNOSIS — S069X0S Unspecified intracranial injury without loss of consciousness, sequela: Secondary | ICD-10-CM | POA: Diagnosis not present

## 2023-06-07 DIAGNOSIS — G8194 Hemiplegia, unspecified affecting left nondominant side: Secondary | ICD-10-CM | POA: Diagnosis not present

## 2023-06-07 DIAGNOSIS — I480 Paroxysmal atrial fibrillation: Secondary | ICD-10-CM | POA: Diagnosis not present

## 2023-06-08 ENCOUNTER — Telehealth: Payer: Self-pay

## 2023-06-08 ENCOUNTER — Telehealth: Payer: Self-pay | Admitting: Physician Assistant

## 2023-06-08 NOTE — Telephone Encounter (Signed)
 Call and notify patient, his wife, or his son that his Diatherix stool test was negative for C. difficile infection.  This is great news.  No evidence of C. difficile.  Continue current plan. Brigitte Canard, PA-C

## 2023-06-08 NOTE — Telephone Encounter (Signed)
 I called pt's wife to "Notify pt. C. Diff is Negative" per Brigitte Canard PA-C. Pt wife is aware and would like to know what will be done now that we know that is negative. I told her that the provider would come up with a plan and that I will call her back regarding that plan. She will call me if she has any questions comments or concerns before then she understood.

## 2023-06-11 ENCOUNTER — Ambulatory Visit: Attending: Cardiology | Admitting: *Deleted

## 2023-06-11 ENCOUNTER — Ambulatory Visit: Payer: Self-pay | Admitting: Physician Assistant

## 2023-06-11 DIAGNOSIS — I4891 Unspecified atrial fibrillation: Secondary | ICD-10-CM | POA: Insufficient documentation

## 2023-06-11 DIAGNOSIS — Z5181 Encounter for therapeutic drug level monitoring: Secondary | ICD-10-CM | POA: Insufficient documentation

## 2023-06-11 LAB — POCT INR: INR: 3 (ref 2.0–3.0)

## 2023-06-11 NOTE — Telephone Encounter (Signed)
 Spoke with pts wife and she is aware of results and recommendations.

## 2023-06-11 NOTE — Patient Instructions (Signed)
Continue warfarin 1 1/2 tablets daily  ?Recheck in 6 weeks in [place] office.   ?

## 2023-06-14 DIAGNOSIS — I480 Paroxysmal atrial fibrillation: Secondary | ICD-10-CM | POA: Diagnosis not present

## 2023-06-14 DIAGNOSIS — J9612 Chronic respiratory failure with hypercapnia: Secondary | ICD-10-CM | POA: Diagnosis not present

## 2023-06-14 DIAGNOSIS — G8194 Hemiplegia, unspecified affecting left nondominant side: Secondary | ICD-10-CM | POA: Diagnosis not present

## 2023-06-14 DIAGNOSIS — S069X0S Unspecified intracranial injury without loss of consciousness, sequela: Secondary | ICD-10-CM | POA: Diagnosis not present

## 2023-06-14 DIAGNOSIS — J9611 Chronic respiratory failure with hypoxia: Secondary | ICD-10-CM | POA: Diagnosis not present

## 2023-06-14 DIAGNOSIS — I129 Hypertensive chronic kidney disease with stage 1 through stage 4 chronic kidney disease, or unspecified chronic kidney disease: Secondary | ICD-10-CM | POA: Diagnosis not present

## 2023-06-26 ENCOUNTER — Other Ambulatory Visit: Payer: Self-pay | Admitting: *Deleted

## 2023-06-26 DIAGNOSIS — I4891 Unspecified atrial fibrillation: Secondary | ICD-10-CM

## 2023-06-26 MED ORDER — WARFARIN SODIUM 5 MG PO TABS: ORAL_TABLET | ORAL | 5 refills | Status: AC

## 2023-06-26 NOTE — Telephone Encounter (Signed)
 Refill request for warfarin:  Last INR was 3.0 on 06/11/23 Next INR due 07/23/23 LOV was 04/05/23  Refill approved.
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
## 2023-07-10 ENCOUNTER — Ambulatory Visit (INDEPENDENT_AMBULATORY_CARE_PROVIDER_SITE_OTHER): Admitting: Physician Assistant

## 2023-07-10 ENCOUNTER — Encounter: Payer: Self-pay | Admitting: Physician Assistant

## 2023-07-10 VITALS — BP 150/60 | HR 64

## 2023-07-10 DIAGNOSIS — R198 Other specified symptoms and signs involving the digestive system and abdomen: Secondary | ICD-10-CM

## 2023-07-10 DIAGNOSIS — K529 Noninfective gastroenteritis and colitis, unspecified: Secondary | ICD-10-CM | POA: Diagnosis not present

## 2023-07-10 NOTE — Patient Instructions (Addendum)
 Continue Probiotic and Fiber Powder daily. Continue Current Medications.  Follow up as needed.    I'm glad you are feeling better!  Brigitte Canard, PA-C   _______________________________________________________  If your blood pressure at your visit was 140/90 or greater, please contact your primary care physician to follow up on this.  _______________________________________________________  If you are age 88 or older, your body mass index should be between 23-30. Your There is no height or weight on file to calculate BMI. If this is out of the aforementioned range listed, please consider follow up with your Primary Care Provider.  If you are age 6 or younger, your body mass index should be between 19-25. Your There is no height or weight on file to calculate BMI. If this is out of the aformentioned range listed, please consider follow up with your Primary Care Provider.   ________________________________________________________  The [facility] providers would like to encourage you to use MYCHART to communicate with providers for non-urgent requests or questions.  Due to long hold times on the telephone, sending your provider a message by [facility] may be a faster and more efficient way to get a response.  Please allow 48 business hours for a response.  Please remember that this is for non-urgent requests.  _______________________________________________________

## 2023-07-10 NOTE — Progress Notes (Signed)
 Agree with assessment and plan as outlined.

## 2023-07-17 DIAGNOSIS — I739 Peripheral vascular disease, unspecified: Secondary | ICD-10-CM | POA: Diagnosis not present

## 2023-07-17 DIAGNOSIS — L84 Corns and callosities: Secondary | ICD-10-CM | POA: Diagnosis not present

## 2023-07-17 DIAGNOSIS — G8190 Hemiplegia, unspecified affecting unspecified side: Secondary | ICD-10-CM | POA: Diagnosis not present

## 2023-07-17 DIAGNOSIS — B351 Tinea unguium: Secondary | ICD-10-CM | POA: Diagnosis not present

## 2023-07-23 ENCOUNTER — Ambulatory Visit

## 2023-07-30 ENCOUNTER — Ambulatory Visit: Attending: Cardiology | Admitting: *Deleted

## 2023-07-30 DIAGNOSIS — Z5181 Encounter for therapeutic drug level monitoring: Secondary | ICD-10-CM | POA: Diagnosis not present

## 2023-07-30 DIAGNOSIS — I4891 Unspecified atrial fibrillation: Secondary | ICD-10-CM | POA: Insufficient documentation

## 2023-07-30 LAB — POCT INR: INR: 2.6 (ref 2.0–3.0)

## 2023-07-30 NOTE — Patient Instructions (Signed)
Continue warfarin 1 1/2 tablets daily  ?Recheck in 6 weeks in [place] office.   ?

## 2023-07-30 NOTE — Progress Notes (Signed)
Please see anticoagulation encounter.

## 2023-09-10 ENCOUNTER — Ambulatory Visit: Attending: Cardiology | Admitting: *Deleted

## 2023-09-10 DIAGNOSIS — I4891 Unspecified atrial fibrillation: Secondary | ICD-10-CM | POA: Insufficient documentation

## 2023-09-10 DIAGNOSIS — Z5181 Encounter for therapeutic drug level monitoring: Secondary | ICD-10-CM | POA: Diagnosis not present

## 2023-09-10 LAB — POCT INR: INR: 3.3 — AB (ref 2.0–3.0)

## 2023-09-10 NOTE — Progress Notes (Signed)
 INR 3.3; Please see anticoagulation encounter

## 2023-09-10 NOTE — Patient Instructions (Signed)
 Hold warfarin tonight then resume 1 1/2 tablets daily Recheck in 6 weeks in [place] office.

## 2023-10-04 DIAGNOSIS — I739 Peripheral vascular disease, unspecified: Secondary | ICD-10-CM | POA: Diagnosis not present

## 2023-10-04 DIAGNOSIS — B351 Tinea unguium: Secondary | ICD-10-CM | POA: Diagnosis not present

## 2023-10-04 DIAGNOSIS — L84 Corns and callosities: Secondary | ICD-10-CM | POA: Diagnosis not present

## 2023-10-08 ENCOUNTER — Encounter: Payer: Self-pay | Admitting: Internal Medicine

## 2023-10-08 ENCOUNTER — Ambulatory Visit: Admitting: Internal Medicine

## 2023-10-08 VITALS — BP 154/84 | HR 66 | Temp 97.8°F | Ht 67.0 in

## 2023-10-08 DIAGNOSIS — C61 Malignant neoplasm of prostate: Secondary | ICD-10-CM | POA: Diagnosis not present

## 2023-10-08 DIAGNOSIS — N1832 Chronic kidney disease, stage 3b: Secondary | ICD-10-CM

## 2023-10-08 DIAGNOSIS — E782 Mixed hyperlipidemia: Secondary | ICD-10-CM | POA: Diagnosis not present

## 2023-10-08 DIAGNOSIS — E559 Vitamin D deficiency, unspecified: Secondary | ICD-10-CM | POA: Diagnosis not present

## 2023-10-08 DIAGNOSIS — Z23 Encounter for immunization: Secondary | ICD-10-CM

## 2023-10-08 DIAGNOSIS — I10 Essential (primary) hypertension: Secondary | ICD-10-CM | POA: Diagnosis not present

## 2023-10-08 DIAGNOSIS — E538 Deficiency of other specified B group vitamins: Secondary | ICD-10-CM

## 2023-10-08 DIAGNOSIS — R739 Hyperglycemia, unspecified: Secondary | ICD-10-CM

## 2023-10-08 LAB — BASIC METABOLIC PANEL WITH GFR
BUN: 18 mg/dL (ref 6–23)
CO2: 27 meq/L (ref 19–32)
Calcium: 9.5 mg/dL (ref 8.4–10.5)
Chloride: 107 meq/L (ref 96–112)
Creatinine, Ser: 1.8 mg/dL — ABNORMAL HIGH (ref 0.40–1.50)
GFR: 33.15 mL/min — ABNORMAL LOW (ref >60.00)
Glucose, Bld: 95 mg/dL (ref 70–99)
Potassium: 3.8 meq/L (ref 3.5–5.1)
Sodium: 142 meq/L (ref 135–145)

## 2023-10-08 LAB — VITAMIN B12: Vitamin B-12: 1404 pg/mL — ABNORMAL HIGH (ref 211–911)

## 2023-10-08 LAB — LIPID PANEL
Cholesterol: 161 mg/dL (ref 0–200)
HDL: 49.7 mg/dL (ref >39.00)
LDL Cholesterol: 73 mg/dL (ref 0–99)
NonHDL: 111.66
Total CHOL/HDL Ratio: 3
Triglycerides: 194 mg/dL — ABNORMAL HIGH (ref 0.0–149.0)
VLDL: 38.8 mg/dL (ref 0.0–40.0)

## 2023-10-08 LAB — CBC WITH DIFFERENTIAL/PLATELET
Basophils Absolute: 0 K/uL (ref 0.0–0.1)
Basophils Relative: 0.4 % (ref 0.0–3.0)
Eosinophils Absolute: 0.1 K/uL (ref 0.0–0.7)
Eosinophils Relative: 1.4 % (ref 0.0–5.0)
HCT: 39.1 % (ref 39.0–52.0)
Hemoglobin: 13 g/dL (ref 13.0–17.0)
Lymphocytes Relative: 14 % (ref 12.0–46.0)
Lymphs Abs: 1.1 K/uL (ref 0.7–4.0)
MCHC: 33.3 g/dL (ref 30.0–36.0)
MCV: 94.6 fl (ref 78.0–100.0)
Monocytes Absolute: 0.9 K/uL (ref 0.1–1.0)
Monocytes Relative: 11.5 % (ref 3.0–12.0)
Neutro Abs: 5.5 K/uL (ref 1.4–7.7)
Neutrophils Relative %: 72.7 % (ref 43.0–77.0)
Platelets: 178 K/uL (ref 150.0–400.0)
RBC: 4.14 Mil/uL — ABNORMAL LOW (ref 4.22–5.81)
RDW: 14.1 % (ref 11.5–15.5)
WBC: 7.6 K/uL (ref 4.0–10.5)

## 2023-10-08 LAB — HEPATIC FUNCTION PANEL
ALT: 10 U/L (ref 0–53)
AST: 13 U/L (ref 0–37)
Albumin: 4.4 g/dL (ref 3.5–5.2)
Alkaline Phosphatase: 52 U/L (ref 39–117)
Bilirubin, Direct: 0.1 mg/dL (ref 0.0–0.3)
Total Bilirubin: 0.6 mg/dL (ref 0.2–1.2)
Total Protein: 6.8 g/dL (ref 6.0–8.3)

## 2023-10-08 LAB — PSA: PSA: 1.54 ng/mL (ref 0.10–4.00)

## 2023-10-08 LAB — TSH: TSH: 1.87 u[IU]/mL (ref 0.35–5.50)

## 2023-10-08 LAB — VITAMIN D 25 HYDROXY (VIT D DEFICIENCY, FRACTURES): VITD: 63.71 ng/mL (ref 30.00–100.00)

## 2023-10-08 LAB — HEMOGLOBIN A1C: Hgb A1c MFr Bld: 6.3 % (ref 4.6–6.5)

## 2023-10-08 NOTE — Assessment & Plan Note (Signed)
 Lab Results  Component Value Date   HGBA1C 6.3 10/08/2023   Stable, pt to continue current medical treatment - diet,wt control

## 2023-10-08 NOTE — Patient Instructions (Signed)
 You had the flu shot today  Please have your Shingrix (shingles) shots done at your local pharmacy., as well as the Prevnar 20 pneumonia shot when you can  Please continue all other medications as before, and refills have been done if requested.  Please have the pharmacy call with any other refills you may need.  Please continue your efforts at being more active, low cholesterol diet, and weight control.  Please keep your appointments with your specialists as you may have planned  Please go to the LAB at the blood drawing area for the tests to be done  You will be contacted by phone if any changes need to be made immediately.  Otherwise, you will receive a letter about your results with an explanation, but please check with MyChart first.  Please make an Appointment to return in 6 months, or sooner if needed

## 2023-10-08 NOTE — Assessment & Plan Note (Signed)
 Lab Results  Component Value Date   VITAMINB12 1,404 (H) 10/08/2023   Stable, cont oral replacement - b12 1000 mcg qd

## 2023-10-08 NOTE — Assessment & Plan Note (Signed)
 Lab Results  Component Value Date   CREATININE 1.80 (H) 10/08/2023   Stable overall, cont to avoid nephrotoxins

## 2023-10-08 NOTE — Assessment & Plan Note (Signed)
 BP Readings from Last 3 Encounters:  10/08/23 (!) 154/84  07/10/23 (!) 150/60  05/30/23 138/80   Uncontrolled, but pt states controlled at home, pt to continue medical treatment norvasc  5 every day, declines other change

## 2023-10-08 NOTE — Assessment & Plan Note (Signed)
 Lab Results  Component Value Date   LDLCALC 73 10/08/2023   uncontrolled, pt to continue current statin crestor  20 mg every day, delcines other change
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
## 2023-10-08 NOTE — Assessment & Plan Note (Signed)
 Last vitamin D  Lab Results  Component Value Date   VD25OH 63.71 10/08/2023   Stable, cont oral replacement

## 2023-10-08 NOTE — Assessment & Plan Note (Signed)
 Pt lost to f/u with urology - for psa with labs

## 2023-10-09 ENCOUNTER — Ambulatory Visit: Payer: Self-pay | Admitting: Internal Medicine

## 2023-10-16 DIAGNOSIS — N184 Chronic kidney disease, stage 4 (severe): Secondary | ICD-10-CM | POA: Diagnosis not present

## 2023-10-22 ENCOUNTER — Encounter

## 2023-10-22 DIAGNOSIS — N1832 Chronic kidney disease, stage 3b: Secondary | ICD-10-CM | POA: Diagnosis not present

## 2023-10-22 DIAGNOSIS — I129 Hypertensive chronic kidney disease with stage 1 through stage 4 chronic kidney disease, or unspecified chronic kidney disease: Secondary | ICD-10-CM | POA: Diagnosis not present

## 2023-10-22 DIAGNOSIS — E872 Acidosis, unspecified: Secondary | ICD-10-CM | POA: Diagnosis not present

## 2023-10-22 DIAGNOSIS — N2581 Secondary hyperparathyroidism of renal origin: Secondary | ICD-10-CM | POA: Diagnosis not present

## 2023-10-22 DIAGNOSIS — D631 Anemia in chronic kidney disease: Secondary | ICD-10-CM | POA: Diagnosis not present

## 2023-10-22 DIAGNOSIS — G839 Paralytic syndrome, unspecified: Secondary | ICD-10-CM | POA: Diagnosis not present

## 2023-10-24 ENCOUNTER — Ambulatory Visit: Attending: Cardiology | Admitting: *Deleted

## 2023-10-24 DIAGNOSIS — Z5181 Encounter for therapeutic drug level monitoring: Secondary | ICD-10-CM | POA: Diagnosis not present

## 2023-10-24 DIAGNOSIS — I4891 Unspecified atrial fibrillation: Secondary | ICD-10-CM | POA: Diagnosis not present

## 2023-10-24 LAB — POCT INR: INR: 2.6 (ref 2.0–3.0)

## 2023-10-24 NOTE — Progress Notes (Signed)
 INR 2.6; Please see anticoagulation encounter

## 2023-10-24 NOTE — Patient Instructions (Signed)
Continue warfarin 1 1/2 tablets daily  ?Recheck in 6 weeks in [place] office.   ?

## 2023-12-05 ENCOUNTER — Ambulatory Visit: Attending: Cardiology | Admitting: *Deleted

## 2023-12-05 DIAGNOSIS — Z5181 Encounter for therapeutic drug level monitoring: Secondary | ICD-10-CM | POA: Diagnosis not present

## 2023-12-05 DIAGNOSIS — I4891 Unspecified atrial fibrillation: Secondary | ICD-10-CM | POA: Insufficient documentation

## 2023-12-05 LAB — POCT INR: INR: 3 (ref 2.0–3.0)

## 2023-12-05 NOTE — Progress Notes (Signed)
 INR 3.0; Please see anticoagulation encounter

## 2023-12-05 NOTE — Patient Instructions (Signed)
Continue warfarin 1 1/2 tablets daily  ?Recheck in 6 weeks in [place] office.   ?

## 2023-12-09 ENCOUNTER — Other Ambulatory Visit: Payer: Self-pay | Admitting: Internal Medicine

## 2023-12-10 ENCOUNTER — Other Ambulatory Visit: Payer: Self-pay

## 2023-12-27 DIAGNOSIS — G8190 Hemiplegia, unspecified affecting unspecified side: Secondary | ICD-10-CM | POA: Diagnosis not present

## 2023-12-27 DIAGNOSIS — I739 Peripheral vascular disease, unspecified: Secondary | ICD-10-CM | POA: Diagnosis not present

## 2023-12-27 DIAGNOSIS — B351 Tinea unguium: Secondary | ICD-10-CM | POA: Diagnosis not present

## 2023-12-27 DIAGNOSIS — L84 Corns and callosities: Secondary | ICD-10-CM | POA: Diagnosis not present

## 2023-12-31 ENCOUNTER — Ambulatory Visit
Admission: EM | Admit: 2023-12-31 | Discharge: 2024-01-01 | Disposition: A | Attending: Family Medicine | Admitting: Family Medicine

## 2023-12-31 ENCOUNTER — Ambulatory Visit

## 2023-12-31 DIAGNOSIS — R051 Acute cough: Secondary | ICD-10-CM

## 2023-12-31 DIAGNOSIS — T17908A Unspecified foreign body in respiratory tract, part unspecified causing other injury, initial encounter: Secondary | ICD-10-CM

## 2023-12-31 MED ORDER — LIDOCAINE VISCOUS HCL 2 % MT SOLN
15.0000 mL | Freq: Once | OROMUCOSAL | Status: AC
  Administered 2023-12-31: 15 mL via OROMUCOSAL

## 2023-12-31 MED ORDER — ALUM & MAG HYDROXIDE-SIMETH 200-200-20 MG/5ML PO SUSP
30.0000 mL | Freq: Once | ORAL | Status: AC
  Administered 2023-12-31: 30 mL via ORAL

## 2023-12-31 NOTE — Discharge Instructions (Addendum)
Go to the emergency department if worsening at any time. °

## 2023-12-31 NOTE — ED Triage Notes (Signed)
 Per son pt was eating dinner and started to become strangled on something and began vomiting, started a 5:00 pm. Pt denies difficulty breathing.

## 2024-01-01 DIAGNOSIS — R051 Acute cough: Secondary | ICD-10-CM | POA: Diagnosis not present

## 2024-01-01 DIAGNOSIS — T17908A Unspecified foreign body in respiratory tract, part unspecified causing other injury, initial encounter: Secondary | ICD-10-CM | POA: Diagnosis not present

## 2024-01-01 NOTE — ED Notes (Addendum)
 Pt physically discharged by other UC staff on 12/31/23. Pt electronically discharged in epic on 01/01/24.
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
## 2024-01-21 ENCOUNTER — Ambulatory Visit: Admitting: *Deleted

## 2024-01-21 DIAGNOSIS — I4891 Unspecified atrial fibrillation: Secondary | ICD-10-CM | POA: Diagnosis not present

## 2024-01-21 DIAGNOSIS — Z5181 Encounter for therapeutic drug level monitoring: Secondary | ICD-10-CM | POA: Diagnosis not present

## 2024-01-21 LAB — POCT INR: INR: 3.6 — AB (ref 2.0–3.0)

## 2024-01-21 NOTE — Patient Instructions (Signed)
 Hold warfarin tonight then resume 1 1/2 tablets daily Recheck in 6 weeks in [place] office.

## 2024-01-21 NOTE — Progress Notes (Signed)
 INR 3.6  Please see anticoagulation encounter

## 2024-03-03 ENCOUNTER — Ambulatory Visit

## 2024-04-07 ENCOUNTER — Ambulatory Visit: Admitting: Internal Medicine
